# Patient Record
Sex: Female | Born: 1950 | Race: White | Hispanic: No | Smoking: Never smoker
Health system: Southern US, Community
[De-identification: ages and names within clinical notes are randomized; demographics above are authoritative.]

## PROBLEM LIST (undated history)

## (undated) DIAGNOSIS — D509 Iron deficiency anemia, unspecified: Secondary | ICD-10-CM

## (undated) DIAGNOSIS — J189 Pneumonia, unspecified organism: Secondary | ICD-10-CM

## (undated) DIAGNOSIS — H905 Unspecified sensorineural hearing loss: Secondary | ICD-10-CM

## (undated) DIAGNOSIS — T7840XA Allergy, unspecified, initial encounter: Secondary | ICD-10-CM

## (undated) DIAGNOSIS — E669 Obesity, unspecified: Secondary | ICD-10-CM

## (undated) DIAGNOSIS — G43009 Migraine without aura, not intractable, without status migrainosus: Secondary | ICD-10-CM

## (undated) DIAGNOSIS — I10 Essential (primary) hypertension: Secondary | ICD-10-CM

## (undated) DIAGNOSIS — G629 Polyneuropathy, unspecified: Secondary | ICD-10-CM

## (undated) DIAGNOSIS — C8300 Small cell B-cell lymphoma, unspecified site: Secondary | ICD-10-CM

## (undated) DIAGNOSIS — E785 Hyperlipidemia, unspecified: Secondary | ICD-10-CM

## (undated) DIAGNOSIS — J45909 Unspecified asthma, uncomplicated: Secondary | ICD-10-CM

## (undated) DIAGNOSIS — L0390 Cellulitis, unspecified: Secondary | ICD-10-CM

## (undated) DIAGNOSIS — F329 Major depressive disorder, single episode, unspecified: Secondary | ICD-10-CM

## (undated) DIAGNOSIS — E1142 Type 2 diabetes mellitus with diabetic polyneuropathy: Secondary | ICD-10-CM

## (undated) DIAGNOSIS — I2510 Atherosclerotic heart disease of native coronary artery without angina pectoris: Secondary | ICD-10-CM

## (undated) DIAGNOSIS — K219 Gastro-esophageal reflux disease without esophagitis: Secondary | ICD-10-CM

## (undated) DIAGNOSIS — G4733 Obstructive sleep apnea (adult) (pediatric): Secondary | ICD-10-CM

## (undated) DIAGNOSIS — Z9989 Dependence on other enabling machines and devices: Secondary | ICD-10-CM

## (undated) DIAGNOSIS — F32A Depression, unspecified: Secondary | ICD-10-CM

## (undated) DIAGNOSIS — M1712 Unilateral primary osteoarthritis, left knee: Secondary | ICD-10-CM

## (undated) DIAGNOSIS — K5792 Diverticulitis of intestine, part unspecified, without perforation or abscess without bleeding: Secondary | ICD-10-CM

## (undated) DIAGNOSIS — I8500 Esophageal varices without bleeding: Secondary | ICD-10-CM

## (undated) DIAGNOSIS — K7460 Unspecified cirrhosis of liver: Secondary | ICD-10-CM

## (undated) DIAGNOSIS — Z8719 Personal history of other diseases of the digestive system: Secondary | ICD-10-CM

## (undated) DIAGNOSIS — T8859XA Other complications of anesthesia, initial encounter: Secondary | ICD-10-CM

## (undated) DIAGNOSIS — T4145XA Adverse effect of unspecified anesthetic, initial encounter: Secondary | ICD-10-CM

## (undated) DIAGNOSIS — N189 Chronic kidney disease, unspecified: Secondary | ICD-10-CM

## (undated) DIAGNOSIS — K5790 Diverticulosis of intestine, part unspecified, without perforation or abscess without bleeding: Secondary | ICD-10-CM

## (undated) DIAGNOSIS — Z860100 Personal history of colon polyps, unspecified: Secondary | ICD-10-CM

## (undated) DIAGNOSIS — M1990 Unspecified osteoarthritis, unspecified site: Secondary | ICD-10-CM

## (undated) DIAGNOSIS — IMO0002 Reserved for concepts with insufficient information to code with codable children: Secondary | ICD-10-CM

## (undated) HISTORY — DX: Essential (primary) hypertension: I10

## (undated) HISTORY — DX: Gastro-esophageal reflux disease without esophagitis: K21.9

## (undated) HISTORY — DX: Depression, unspecified: F32.A

## (undated) HISTORY — DX: Type 2 diabetes mellitus with diabetic polyneuropathy: E11.42

## (undated) HISTORY — DX: Allergy, unspecified, initial encounter: T78.40XA

## (undated) HISTORY — DX: Personal history of colonic polyps: Z86.010

## (undated) HISTORY — DX: Small cell B-cell lymphoma, unspecified site: C83.00

## (undated) HISTORY — DX: Reserved for concepts with insufficient information to code with codable children: IMO0002

## (undated) HISTORY — DX: Unspecified asthma, uncomplicated: J45.909

## (undated) HISTORY — DX: Polyneuropathy, unspecified: G62.9

## (undated) HISTORY — PX: TONSILLECTOMY: SUR1361

## (undated) HISTORY — DX: Major depressive disorder, single episode, unspecified: F32.9

## (undated) HISTORY — DX: Unspecified osteoarthritis, unspecified site: M19.90

## (undated) HISTORY — DX: Unspecified cirrhosis of liver: K74.60

## (undated) HISTORY — DX: Diverticulosis of intestine, part unspecified, without perforation or abscess without bleeding: K57.90

## (undated) HISTORY — DX: Esophageal varices without bleeding: I85.00

## (undated) HISTORY — PX: ANKLE SURGERY: SHX546

## (undated) HISTORY — DX: Adverse effect of unspecified anesthetic, initial encounter: T41.45XA

## (undated) HISTORY — DX: Diverticulitis of intestine, part unspecified, without perforation or abscess without bleeding: K57.92

## (undated) HISTORY — DX: Personal history of colon polyps, unspecified: Z86.0100

## (undated) HISTORY — DX: Obesity, unspecified: E66.9

## (undated) HISTORY — DX: Migraine without aura, not intractable, without status migrainosus: G43.009

## (undated) HISTORY — DX: Hyperlipidemia, unspecified: E78.5

## (undated) HISTORY — DX: Other complications of anesthesia, initial encounter: T88.59XA

## (undated) HISTORY — PX: ROTATOR CUFF REPAIR: SHX139

---
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
## 1997-10-21 ENCOUNTER — Ambulatory Visit (HOSPITAL_COMMUNITY): Admission: RE | Admit: 1997-10-21 | Discharge: 1997-10-21 | Payer: Self-pay | Admitting: Obstetrics and Gynecology

## 1998-11-24 ENCOUNTER — Encounter: Payer: Self-pay | Admitting: Obstetrics and Gynecology

## 1998-11-24 ENCOUNTER — Ambulatory Visit (HOSPITAL_COMMUNITY): Admission: RE | Admit: 1998-11-24 | Discharge: 1998-11-24 | Payer: Self-pay | Admitting: Obstetrics and Gynecology

## 1998-12-03 ENCOUNTER — Other Ambulatory Visit: Admission: RE | Admit: 1998-12-03 | Discharge: 1998-12-03 | Payer: Self-pay | Admitting: Obstetrics and Gynecology

## 2000-01-11 ENCOUNTER — Ambulatory Visit (HOSPITAL_COMMUNITY): Admission: RE | Admit: 2000-01-11 | Discharge: 2000-01-11 | Payer: Self-pay | Admitting: Obstetrics and Gynecology

## 2000-01-11 ENCOUNTER — Encounter: Payer: Self-pay | Admitting: Obstetrics and Gynecology

## 2000-03-27 ENCOUNTER — Ambulatory Visit (HOSPITAL_COMMUNITY): Admission: RE | Admit: 2000-03-27 | Discharge: 2000-03-27 | Payer: Self-pay | Admitting: Gastroenterology

## 2000-06-30 ENCOUNTER — Encounter: Admission: RE | Admit: 2000-06-30 | Discharge: 2000-09-28 | Payer: Self-pay | Admitting: Internal Medicine

## 2001-04-02 ENCOUNTER — Ambulatory Visit (HOSPITAL_COMMUNITY): Admission: RE | Admit: 2001-04-02 | Discharge: 2001-04-02 | Payer: Self-pay | Admitting: Internal Medicine

## 2001-05-10 ENCOUNTER — Other Ambulatory Visit: Admission: RE | Admit: 2001-05-10 | Discharge: 2001-05-10 | Payer: Self-pay | Admitting: Obstetrics and Gynecology

## 2002-01-17 ENCOUNTER — Ambulatory Visit (HOSPITAL_COMMUNITY): Admission: RE | Admit: 2002-01-17 | Discharge: 2002-01-17 | Payer: Self-pay | Admitting: Obstetrics and Gynecology

## 2002-01-17 ENCOUNTER — Encounter: Payer: Self-pay | Admitting: Obstetrics and Gynecology

## 2003-01-16 ENCOUNTER — Other Ambulatory Visit: Admission: RE | Admit: 2003-01-16 | Discharge: 2003-01-16 | Payer: Self-pay | Admitting: Obstetrics and Gynecology

## 2003-01-28 ENCOUNTER — Encounter: Payer: Self-pay | Admitting: Obstetrics and Gynecology

## 2003-01-28 ENCOUNTER — Ambulatory Visit (HOSPITAL_COMMUNITY): Admission: RE | Admit: 2003-01-28 | Discharge: 2003-01-28 | Payer: Self-pay | Admitting: Obstetrics and Gynecology

## 2004-02-19 ENCOUNTER — Other Ambulatory Visit: Admission: RE | Admit: 2004-02-19 | Discharge: 2004-02-19 | Payer: Self-pay | Admitting: Family Medicine

## 2004-02-19 ENCOUNTER — Encounter: Admission: RE | Admit: 2004-02-19 | Discharge: 2004-02-19 | Payer: Self-pay | Admitting: Obstetrics and Gynecology

## 2005-10-19 ENCOUNTER — Emergency Department (HOSPITAL_COMMUNITY): Admission: EM | Admit: 2005-10-19 | Discharge: 2005-10-19 | Payer: Self-pay | Admitting: Emergency Medicine

## 2005-12-13 ENCOUNTER — Ambulatory Visit: Payer: Self-pay | Admitting: Internal Medicine

## 2006-01-12 ENCOUNTER — Ambulatory Visit: Payer: Self-pay | Admitting: Internal Medicine

## 2010-12-27 ENCOUNTER — Telehealth: Payer: Self-pay | Admitting: Internal Medicine

## 2010-12-28 NOTE — Telephone Encounter (Signed)
I recommended office visit with me. Also, see if she has had a recent CBC.

## 2010-12-28 NOTE — Telephone Encounter (Signed)
Spoke with pt and made her an appt to see Dr. Marina Goodell 12/30/10@4pm  (per Dr. Marina Goodell). Pt states she did have labs done in March, I have called Dr. Vicente Males office to have records sent.

## 2010-12-28 NOTE — Telephone Encounter (Signed)
Spoke with pt and she states that she just had her yearly physical with Dr. Felipa Eth and her stool tested positive for blood. Dr. Felipa Eth instructed the pt to call Dr. Marina Goodell. Pt had a colon with Dr. Marina Goodell in May of 2007.Recall was for 5 years. Dr. Marina Goodell please advise.

## 2010-12-30 ENCOUNTER — Encounter: Payer: Self-pay | Admitting: Internal Medicine

## 2010-12-30 ENCOUNTER — Ambulatory Visit (INDEPENDENT_AMBULATORY_CARE_PROVIDER_SITE_OTHER): Payer: PRIVATE HEALTH INSURANCE | Admitting: Internal Medicine

## 2010-12-30 DIAGNOSIS — Z86010 Personal history of colonic polyps: Secondary | ICD-10-CM

## 2010-12-30 DIAGNOSIS — K5732 Diverticulitis of large intestine without perforation or abscess without bleeding: Secondary | ICD-10-CM

## 2010-12-30 DIAGNOSIS — K219 Gastro-esophageal reflux disease without esophagitis: Secondary | ICD-10-CM

## 2010-12-30 DIAGNOSIS — Z1211 Encounter for screening for malignant neoplasm of colon: Secondary | ICD-10-CM

## 2010-12-30 DIAGNOSIS — E119 Type 2 diabetes mellitus without complications: Secondary | ICD-10-CM

## 2010-12-30 DIAGNOSIS — R195 Other fecal abnormalities: Secondary | ICD-10-CM

## 2010-12-30 MED ORDER — CIPROFLOXACIN HCL 500 MG PO TABS: 500.0000 mg | ORAL_TABLET | Freq: Two times a day (BID) | ORAL | Status: AC

## 2010-12-30 MED ORDER — PEG-KCL-NACL-NASULF-NA ASC-C 100 G PO SOLR: 1.0000 | Freq: Once | ORAL | Status: AC

## 2010-12-30 MED ORDER — METRONIDAZOLE 500 MG PO TABS: 500.0000 mg | ORAL_TABLET | Freq: Two times a day (BID) | ORAL | Status: AC

## 2010-12-30 NOTE — Progress Notes (Signed)
HISTORY OF PRESENT ILLNESS:  Janice [name] is a 60 y.o. female with the below list of medical history who presents today regarding a number of GI issues including recently identified Hemoccult-positive stool, recent bout of diverticulitis, chronic GERD, and followup colonoscopy. The patient was last evaluated in 2007 to establish as a new patient regarding abdominal pain and surveillance colonoscopy. See that dictation. Complete colonoscopy performed 01/12/2006 revealed marked left-sided diverticulosis and diminutive colon polyps which were removed. One hyperplastic and the other without tissue. Follow up in 5 years recommended. Hemoccult testing 12/02/2010 returned positive. Laboratories at that time revealed a normal hemoglobin of 14.0. Mild elevated hepatic transaminases felt secondary to fatty liver. Problems in February or March with abdominal pain felt to be diverticulitis for which she was treated with Cipro and Flagyl. Some residual discomfort at this time. Chronic GERD greater than 5 years. No prior EGD. Concerns over finance.  REVIEW OF SYSTEMS:  All non-GI ROS negative except for arthritis, fatigue, fever, insomnia due to joint aches.  Past Medical History  Diagnosis Date  . Depression   . History of colon polyps     hyperplastic  . DDD (degenerative disc disease)   . Hypertension   . Sleep apnea, obstructive   . Diabetic peripheral neuropathy   . Common migraine   . Diabetes mellitus   . GERD (gastroesophageal reflux disease)   . DJD (degenerative joint disease)   . Obesity   . Diverticulosis     Past Surgical History  Procedure Date  . Ankle surgery     Left     Social History Janice [name]  reports that she has never smoked. She has never used smokeless tobacco. She reports that she does not drink alcohol or use illicit drugs.  family history includes Bone cancer in her mother; Breast cancer in her mother; and Heart failure in her father.  There is no history of  Colon cancer.  No Known Allergies     PHYSICAL EXAMINATION: Vital signs: BP 128/74  Pulse 88  Ht 5\' 4"  (1.626 m)  Wt 210 lb (95.255 kg)  BMI 36.05 kg/m2  Constitutional: generally well-appearing, no acute distress Psychiatric: alert and oriented x3, cooperative Eyes: extraocular movements intact, anicteric, conjunctiva pink Mouth: oral pharynx moist, no lesions Neck: supple no lymphadenopathy Cardiovascular: heart regular rate and rhythm, no murmur Lungs: clear to auscultation bilaterally Abdomen: soft, nondistended, no obvious ascites, no peritoneal signs, normal bowel sounds, no organomegaly. There is focal tenderness in left lower quadrant without rebound. Rectal: Deferred until colonoscopy Extremities: no lower extremity edema bilaterally Skin: no lesions on visible extremities Neuro: No focal deficits.   ASSESSMENT:  #1. Hemoccult-positive stool. Rule out upper or lower GI source #2. History of colon polyps. Due for followup #3. History of diverticulitis. Now with persistent low-grade diverticulitis #4. Chronic GERD. Rule out erosive disease or Barrett's. #5. Fatty liver #6. Multiple medical problems including diabetes   PLAN:  #1. Additional course of ciprofloxacin 500 mg by mouth twice a day x10 days and metronidazole 500 mg by mouth twice a day x10 days prescribed #2. Schedule colonoscopy in 4 weeks. Movi prep prescribed. Diabetic medications adjusted for the exam.The nature of the procedure, as well as the risks, benefits, and alternatives were carefully and thoroughly reviewed with the patient. Ample time for discussion and questions allowed. The patient understood, was satisfied, and agreed to proceed.  #3. Recommended endoscopy. Patient declined due to cost #4. In lieu of #3 initiate PPI therapy.  Multiple samples of Nexium given. Thereafter, omeprazole 20 mg daily #5. Hold a.m. insulin for procedures as well as metformin

## 2010-12-30 NOTE — Patient Instructions (Signed)
Colon [facility] 01/28/11 8:30 am arrive at 7:30 am Movi prep prescriptions sent to pharmacy for you to pick along with Metronidazole and Cipro. Colonoscopy brochure given for you to read Nexium samples given.

## 2010-12-31 ENCOUNTER — Encounter: Payer: Self-pay | Admitting: Internal Medicine

## 2010-12-31 ENCOUNTER — Telehealth: Payer: Self-pay | Admitting: Internal Medicine

## 2010-12-31 NOTE — Telephone Encounter (Signed)
Attempted to call pt back at work number and home number but were unable to reach pt at either number.

## 2011-01-03 NOTE — Telephone Encounter (Signed)
2 years or 2 months? Okay to wait 2 months, but not 2 years.

## 2011-01-03 NOTE — Telephone Encounter (Signed)
Attempted to call pt and got recording stating that the phone is not able to take messages. Called the work number and they stated that no one by that name works there.

## 2011-01-03 NOTE — Telephone Encounter (Signed)
Called pt back at [phone], left message for her to call back.

## 2011-01-03 NOTE — Telephone Encounter (Signed)
Pt wants to know if it would be ok for her to wait 2 years to have her colon, she would like Dr. Lamar Sprinkles opinion. Dr. Marina Goodell please advise.

## 2011-01-04 NOTE — Telephone Encounter (Signed)
Pt aware of Dr. Marina Goodell stating it is ok for her to wait 2 months but not 2 years. Pt states she will keep the appt she has scheduled.

## 2011-01-17 ENCOUNTER — Telehealth: Payer: Self-pay | Admitting: Internal Medicine

## 2011-01-17 NOTE — Telephone Encounter (Signed)
Ok to move the appt if she is feeling ok

## 2011-01-17 NOTE — Telephone Encounter (Signed)
Pt wants to move her colon from 5/18 to 5/11. There was an opening for the same appt time. Pt wants to make sure this is ok with Dr. Marina Goodell since he placed her on antibiotics at her last visit and had mentioned scheduling the colon out 30days from that date. Informed pt that Dr. Marina Goodell was out of the office today but that I would check with him tomorrow and let her know. Pt verbalized understanding. Dr. Marina Goodell please advise if the change in appt date is ok.

## 2011-01-17 NOTE — Telephone Encounter (Signed)
Pt aware and states she feels fine. Appt moved to 01/21/11.

## 2011-01-20 ENCOUNTER — Other Ambulatory Visit: Payer: Self-pay | Admitting: Internal Medicine

## 2011-01-20 MED ORDER — ESOMEPRAZOLE MAGNESIUM 40 MG PO CPDR: 40.0000 mg | DELAYED_RELEASE_CAPSULE | Freq: Every day | ORAL | Status: DC

## 2011-01-20 NOTE — Telephone Encounter (Signed)
Rx. For Nexium sent to pharmacy.

## 2011-01-21 ENCOUNTER — Encounter: Payer: Self-pay | Admitting: Internal Medicine

## 2011-01-21 ENCOUNTER — Ambulatory Visit (AMBULATORY_SURGERY_CENTER): Payer: PRIVATE HEALTH INSURANCE | Admitting: Internal Medicine

## 2011-01-21 VITALS — BP 139/80 | HR 68 | Temp 98.0°F | Resp 18 | Ht 64.0 in | Wt 217.0 lb

## 2011-01-21 DIAGNOSIS — D126 Benign neoplasm of colon, unspecified: Secondary | ICD-10-CM

## 2011-01-21 DIAGNOSIS — K5730 Diverticulosis of large intestine without perforation or abscess without bleeding: Secondary | ICD-10-CM

## 2011-01-21 DIAGNOSIS — Z86010 Personal history of colonic polyps: Secondary | ICD-10-CM

## 2011-01-21 DIAGNOSIS — Z1211 Encounter for screening for malignant neoplasm of colon: Secondary | ICD-10-CM

## 2011-01-21 LAB — GLUCOSE, CAPILLARY
Glucose-Capillary: 159 mg/dL — ABNORMAL HIGH (ref 70–99)
Glucose-Capillary: 140 mg/dL — ABNORMAL HIGH (ref 70–99)

## 2011-01-21 MED ORDER — SODIUM CHLORIDE 0.9 % IV SOLN: 500.0000 mL | INTRAVENOUS | Status: DC

## 2011-01-21 NOTE — Progress Notes (Signed)
Diverticulosis present per MD, polyp

## 2011-01-21 NOTE — Patient Instructions (Signed)
Please read over discharge instructions given today. Also read over education sheets on Colon polyps,diverticulosis,and High fiber diet. Please repeat colonoscopy in 3 years. You will receive a result letter in your mail in about 1-2 weeks. Resume your current medications. Blood sugar was 140. Call us with any problems or questions.

## 2011-01-24 ENCOUNTER — Telehealth: Payer: Self-pay | Admitting: Internal Medicine

## 2011-01-24 ENCOUNTER — Telehealth: Payer: Self-pay | Admitting: *Deleted

## 2011-01-24 MED ORDER — OMEPRAZOLE 40 MG PO CPDR: 40.0000 mg | DELAYED_RELEASE_CAPSULE | Freq: Every day | ORAL | Status: DC

## 2011-01-24 NOTE — Telephone Encounter (Signed)

## 2011-01-24 NOTE — Telephone Encounter (Signed)
Rx. Sent for Omeprazole

## 2011-01-28 ENCOUNTER — Other Ambulatory Visit: Payer: PRIVATE HEALTH INSURANCE | Admitting: Internal Medicine

## 2011-01-29 ENCOUNTER — Inpatient Hospital Stay (HOSPITAL_COMMUNITY)
Admission: EM | Admit: 2011-01-29 | Discharge: 2011-01-31 | DRG: 921 | Disposition: A | Discharge status: Home / Self Care | Payer: PRIVATE HEALTH INSURANCE | Attending: Gastroenterology | Admitting: Gastroenterology

## 2011-01-29 ENCOUNTER — Telehealth: Payer: Self-pay | Admitting: Gastroenterology

## 2011-01-29 DIAGNOSIS — I10 Essential (primary) hypertension: Secondary | ICD-10-CM | POA: Diagnosis present

## 2011-01-29 DIAGNOSIS — IMO0002 Reserved for concepts with insufficient information to code with codable children: Principal | ICD-10-CM | POA: Diagnosis present

## 2011-01-29 DIAGNOSIS — Z860100 Personal history of colon polyps, unspecified: Secondary | ICD-10-CM

## 2011-01-29 DIAGNOSIS — Z79899 Other long term (current) drug therapy: Secondary | ICD-10-CM

## 2011-01-29 DIAGNOSIS — K5730 Diverticulosis of large intestine without perforation or abscess without bleeding: Secondary | ICD-10-CM | POA: Diagnosis present

## 2011-01-29 DIAGNOSIS — Y838 Other surgical procedures as the cause of abnormal reaction of the patient, or of later complication, without mention of misadventure at the time of the procedure: Secondary | ICD-10-CM | POA: Diagnosis present

## 2011-01-29 DIAGNOSIS — K625 Hemorrhage of anus and rectum: Secondary | ICD-10-CM

## 2011-01-29 DIAGNOSIS — E119 Type 2 diabetes mellitus without complications: Secondary | ICD-10-CM | POA: Diagnosis present

## 2011-01-29 DIAGNOSIS — Z7982 Long term (current) use of aspirin: Secondary | ICD-10-CM

## 2011-01-29 LAB — GLUCOSE, CAPILLARY: Glucose-Capillary: 117 mg/dL — ABNORMAL HIGH (ref 70–99)

## 2011-01-29 LAB — COMPREHENSIVE METABOLIC PANEL WITH GFR
ALT: 56 U/L — ABNORMAL HIGH (ref 0–35)
AST: 36 U/L (ref 0–37)
Albumin: 4.3 g/dL (ref 3.5–5.2)
Alkaline Phosphatase: 74 U/L (ref 39–117)
BUN: 13 mg/dL (ref 6–23)
CO2: 27 meq/L (ref 19–32)
Calcium: 9.5 mg/dL (ref 8.4–10.5)
Chloride: 104 meq/L (ref 96–112)
Creatinine, Ser: 0.55 mg/dL (ref 0.4–1.2)
GFR calc non Af Amer: >60 mL/min
Glucose, Bld: 141 mg/dL — ABNORMAL HIGH (ref 70–99)
Potassium: 3.9 meq/L (ref 3.5–5.1)
Sodium: 140 meq/L (ref 135–145)
Total Bilirubin: 0.3 mg/dL (ref 0.3–1.2)
Total Protein: 6.5 g/dL (ref 6.0–8.3)

## 2011-01-29 LAB — DIFFERENTIAL
Basophils Absolute: 0.1 K/uL (ref 0.0–0.1)
Basophils Relative: 1 % (ref 0–1)
Eosinophils Absolute: 0.3 K/uL (ref 0.0–0.7)
Eosinophils Relative: 5 % (ref 0–5)
Lymphocytes Relative: 37 % (ref 12–46)
Lymphs Abs: 2.6 K/uL (ref 0.7–4.0)
Monocytes Absolute: 0.5 K/uL (ref 0.1–1.0)
Monocytes Relative: 8 % (ref 3–12)
Neutro Abs: 3.4 K/uL (ref 1.7–7.7)
Neutrophils Relative %: 50 % (ref 43–77)

## 2011-01-29 LAB — CBC
HCT: 38.8 % (ref 36.0–46.0)
HCT: 36.9 % (ref 36.0–46.0)
Hemoglobin: 12.9 g/dL (ref 12.0–15.0)
Hemoglobin: 11.9 g/dL — ABNORMAL LOW (ref 12.0–15.0)
MCH: 29.4 pg (ref 26.0–34.0)
MCH: 28.7 pg (ref 26.0–34.0)
MCHC: 33.2 g/dL (ref 30.0–36.0)
MCHC: 32.2 g/dL (ref 30.0–36.0)
MCV: 88.4 fL (ref 78.0–100.0)
MCV: 88.9 fL (ref 78.0–100.0)
Platelets: 162 10*3/uL (ref 150–400)
Platelets: 168 10*3/uL (ref 150–400)
RBC: 4.39 MIL/uL (ref 3.87–5.11)
RBC: 4.15 MIL/uL (ref 3.87–5.11)
RDW: 13.2 % (ref 11.5–15.5)
RDW: 13.1 % (ref 11.5–15.5)
WBC: 6.9 10*3/uL (ref 4.0–10.5)
WBC: 7.6 10*3/uL (ref 4.0–10.5)

## 2011-01-29 LAB — URINALYSIS, ROUTINE W REFLEX MICROSCOPIC
Bilirubin Urine: NEGATIVE
Glucose, UA: NEGATIVE mg/dL
Hgb urine dipstick: NEGATIVE
Ketones, ur: NEGATIVE mg/dL
Nitrite: NEGATIVE
Protein, ur: NEGATIVE mg/dL
Specific Gravity, Urine: 1.016 (ref 1.005–1.030)
Urobilinogen, UA: 0.2 mg/dL (ref 0.0–1.0)
pH: 5.5 (ref 5.0–8.0)

## 2011-01-29 LAB — URINE MICROSCOPIC-ADD ON

## 2011-01-29 LAB — TYPE AND SCREEN
ABO/RH(D): A POS
Antibody Screen: NEGATIVE

## 2011-01-29 LAB — OCCULT BLOOD, POC DEVICE: Fecal Occult Bld: POSITIVE

## 2011-01-29 LAB — ABO/RH: ABO/RH(D): A POS

## 2011-01-29 NOTE — Telephone Encounter (Signed)
Colonoscopy 1 week ago with Dr. Marina Goodell. 2cm pedunculated polyp remove with snare/cautery.  Fine until today, cramping and single episode of red rectal bleeding.  She otherwise feels fine, will call or go to ER if bleeding continues.  Post polypectomy bleeding?

## 2011-01-30 DIAGNOSIS — K5730 Diverticulosis of large intestine without perforation or abscess without bleeding: Secondary | ICD-10-CM

## 2011-01-30 DIAGNOSIS — K922 Gastrointestinal hemorrhage, unspecified: Secondary | ICD-10-CM

## 2011-01-30 DIAGNOSIS — K633 Ulcer of intestine: Secondary | ICD-10-CM

## 2011-01-30 LAB — CBC
HCT: 34.2 % — ABNORMAL LOW (ref 36.0–46.0)
HCT: 36.5 % (ref 36.0–46.0)
Hemoglobin: 11.1 g/dL — ABNORMAL LOW (ref 12.0–15.0)
Hemoglobin: 11.9 g/dL — ABNORMAL LOW (ref 12.0–15.0)
MCH: 28.9 pg (ref 26.0–34.0)
MCH: 28.9 pg (ref 26.0–34.0)
MCHC: 32.5 g/dL (ref 30.0–36.0)
MCHC: 32.6 g/dL (ref 30.0–36.0)
MCV: 89.1 fL (ref 78.0–100.0)
MCV: 88.6 fL (ref 78.0–100.0)
Platelets: 163 10*3/uL (ref 150–400)
Platelets: 165 10*3/uL (ref 150–400)
RBC: 3.84 MIL/uL — ABNORMAL LOW (ref 3.87–5.11)
RBC: 4.12 MIL/uL (ref 3.87–5.11)
RDW: 13.3 % (ref 11.5–15.5)
RDW: 13.4 % (ref 11.5–15.5)
WBC: 6.5 10*3/uL (ref 4.0–10.5)
WBC: 6.1 10*3/uL (ref 4.0–10.5)

## 2011-01-30 LAB — BASIC METABOLIC PANEL
BUN: 11 mg/dL (ref 6–23)
CO2: 24 mEq/L (ref 19–32)
Calcium: 9.2 mg/dL (ref 8.4–10.5)
Chloride: 108 mEq/L (ref 96–112)
Creatinine, Ser: 0.5 mg/dL (ref 0.4–1.2)
GFR calc Af Amer: >60 mL/min (ref >60)
GFR calc non Af Amer: >60 mL/min (ref >60)
Glucose, Bld: 114 mg/dL — ABNORMAL HIGH (ref 70–99)
Potassium: 4.1 mEq/L (ref 3.5–5.1)
Sodium: 141 mEq/L (ref 135–145)

## 2011-01-30 LAB — GLUCOSE, CAPILLARY
Glucose-Capillary: 251 mg/dL — ABNORMAL HIGH (ref 70–99)
Glucose-Capillary: 112 mg/dL — ABNORMAL HIGH (ref 70–99)

## 2011-01-30 LAB — HEMOGLOBIN AND HEMATOCRIT, BLOOD
HCT: 33 % — ABNORMAL LOW (ref 36.0–46.0)
Hemoglobin: 11 g/dL — ABNORMAL LOW (ref 12.0–15.0)

## 2011-01-31 DIAGNOSIS — K633 Ulcer of intestine: Secondary | ICD-10-CM

## 2011-01-31 DIAGNOSIS — K922 Gastrointestinal hemorrhage, unspecified: Secondary | ICD-10-CM

## 2011-01-31 LAB — CBC
HCT: 32.8 % — ABNORMAL LOW (ref 36.0–46.0)
Hemoglobin: 10.8 g/dL — ABNORMAL LOW (ref 12.0–15.0)
MCH: 29 pg (ref 26.0–34.0)
MCHC: 32.9 g/dL (ref 30.0–36.0)
MCV: 88.2 fL (ref 78.0–100.0)
Platelets: 134 10*3/uL — ABNORMAL LOW (ref 150–400)
RBC: 3.72 MIL/uL — ABNORMAL LOW (ref 3.87–5.11)
RDW: 13.2 % (ref 11.5–15.5)
WBC: 6.1 10*3/uL (ref 4.0–10.5)

## 2011-01-31 LAB — GLUCOSE, CAPILLARY: Glucose-Capillary: 179 mg/dL — ABNORMAL HIGH (ref 70–99)

## 2011-02-01 NOTE — Telephone Encounter (Signed)
She was admitted, underwent colonoscopy on Sunday.  Will need cqi charting

## 2011-02-21 ENCOUNTER — Telehealth: Payer: Self-pay | Admitting: Internal Medicine

## 2011-02-21 NOTE — Telephone Encounter (Signed)
Pt had a FLEX SIG on 01/30/11 and bleed and had to be seen again. Pt stated electrodes were placed in the same areas again on her upper chest above each breast. The area is red and irritated and has "whelts" in the locations now. Pt is a diabetic.  Pt has tried Neosporin w/o much change. She has started using a steroid cream of her husband's that is helping some. Pt instructed to use the steroid cream, triamcinolone, and I will call her ; pt stated understanding.

## 2011-02-23 NOTE — Telephone Encounter (Signed)
lmom for pt to call back and give [place] an update on her skin problem.

## 2011-02-23 NOTE — Telephone Encounter (Signed)
Pt stated the itching has improved greatly, but the bumps and whelts remain- she thinks everything will be ok. Informed pt to call us back and we will see her if problem doesn't go away; pt stated understanding.

## 2011-03-08 NOTE — Discharge Summary (Addendum)
[name], [name]                 ACCOUNT NO.:  [id number]  MEDICAL RECORD NO.:  [id number]  LOCATION:  1529                         FACILITY:  [facility]  PHYSICIAN:  Iva Boop, MD,FACGDATE OF BIRTH:  [birth year]  DATE OF ADMISSION:  01/29/2011 DATE OF DISCHARGE:  01/31/2011                              DISCHARGE SUMMARY   ADMITTING DIAGNOSES: 49. A 60 year old female with acute lower gastrointestinal bleed status     post colonoscopy and polypectomy 8 days previous, course most     consistent with post polypectomy hemorrhage, rule out diverticular. 2. Adult-onset diabetes mellitus. 3. Osteoarthritis. 4. Hypertension 5. Obesity. 6. Obstructive sleep apnea with CPAP use.  DISCHARGE DIAGNOSES: 1. Resolved acute postpolypectomy bleed. 2. Anemia, normocytic, secondary to above. 3. Tubovillous adenomatous polyp removed at time of colonoscopy.  CONSULTATIONS:  None.  PROCEDURES:  Colonoscopy with Endo clipping on Jan 30, 2011, Dr. Christella Hartigan.  BRIEF HISTORY:  Janice [name] is a pleasant 60 year old female who had undergone colonoscopy per Dr. Marina Goodell at the [facility] 8 days prior to this admission.  Procedure was done for polyps surveillance.  She was found to have a 2-cm polyp which was removed with snare and cautery. She did well for 6 to 7 days and then on the day of admission had 4 episodes of bright red rectal bleeding and minor abdominal cramping. She had been taking aspirin daily.  No NSAIDs.  She had called in, was advised to come to the emergency room, was seen and evaluated and admitted, hemodynamically stable, with hemoglobin of 12.9 on admission with probable acute post polypectomy bleed.  LABORATORY STUDIES:  On Jan 29, 2011, WBC 6.9, hemoglobin 12.9, hematocrit of 38.8, MCV of 88.4, platelets 162.  Followup on the 20th, WBC 6.1, hemoglobin 11.9, hematocrit of 36.5 and on the 21st of WBC of 6.1, hemoglobin 10.8, hematocrit of 32.8.  Electrolytes within  normal limits.  Glucose was 114, BUN 11, creatinine 0.50.  Urinalysis, 0 to 2 WBCs.  X-RAY STUDIES:  None.  HOSPITAL COURSE:  The patient was admitted to the service of Dr. Rob Bunting who was covering on-call.  She was initially kept on clear liquids, placed on IV fluids, serial H and H and underwent bowel prep on the evening of admission with plans for possible colonoscopy the following morning, depending on her course.  The patient tolerated the prep without difficulty.  The following morning was still having some red rectal bleeding and colonoscopy was pursued.  This showed diverticulosis in the left colon.  No blood in the colon.  The site of her recent polypectomy was found and there was an ulcer at the tip of a broad-based stalk.  There was no active bleeding or visible vessel.  The ulcer was Endoclipped.  The patient tolerated the procedure without difficulty, was observed another 24 hours, had no further evidence of bleeding, hemoglobin 10.8.  Pathology from her original polypectomy came back showing a tubovillous adenoma with no evidence of dysplasia.  She was allowed discharge to home on Jan 31, 2011, with instructions to follow up with Dr. Marina Goodell.  She was to avoid all aspirin and NSAIDs over the next 14 days, to  limit vigorous activity over the next 48 hours and advance her diet as tolerated.  MEDICATIONS ON DISCHARGE:  Same as on admission which include: 1. NovoLog 70/30 sliding scale at breakfast, 25 at lunch and 80 at     dinner. 2. Metformin 1 g b.i.d. 3. Lisinopril 20 mg daily. 4. Fluticasone 2 sprays at bedtime. 5. Loratadine 10 mg at bedtime. 6. Sonata 10 mg q.p.m. 7. Reglan as needed. 8. She had been taking Aleve and this was discontinued. 9. Postop eye drops as needed. 10.Aspirin 81 mg daily, on hold for the next 2 weeks. 11.Paxil 10 mg daily.     Amy Esterwood, PA-C   ______________________________ Iva Boop, MD,FACG    AE/MEDQ  D:   02/17/2011  T:  02/17/2011  Job:  [id number]  Electronically Signed by AMY ESTERWOOD PA-C on 03/08/2011 09:18:39 AM Electronically Signed by Stan Head MDFACG on 03/22/2011 03:28:50 PM

## 2011-03-22 ENCOUNTER — Other Ambulatory Visit: Payer: Self-pay | Admitting: Orthopedic Surgery

## 2011-03-22 DIAGNOSIS — M25511 Pain in right shoulder: Secondary | ICD-10-CM

## 2011-03-24 ENCOUNTER — Ambulatory Visit
Admission: RE | Admit: 2011-03-24 | Discharge: 2011-03-24 | Disposition: A | Discharge status: Home / Self Care | Payer: PRIVATE HEALTH INSURANCE | Source: Ambulatory Visit | Attending: Orthopedic Surgery | Admitting: Orthopedic Surgery

## 2011-03-24 DIAGNOSIS — M25511 Pain in right shoulder: Secondary | ICD-10-CM

## 2011-09-11 ENCOUNTER — Ambulatory Visit (INDEPENDENT_AMBULATORY_CARE_PROVIDER_SITE_OTHER): Payer: PRIVATE HEALTH INSURANCE

## 2011-09-11 DIAGNOSIS — J159 Unspecified bacterial pneumonia: Secondary | ICD-10-CM

## 2011-09-13 DIAGNOSIS — L0390 Cellulitis, unspecified: Secondary | ICD-10-CM

## 2011-09-13 HISTORY — DX: Cellulitis, unspecified: L03.90

## 2012-03-06 ENCOUNTER — Telehealth: Payer: Self-pay | Admitting: Internal Medicine

## 2012-03-06 MED ORDER — METRONIDAZOLE 500 MG PO TABS: 500.0000 mg | ORAL_TABLET | Freq: Two times a day (BID) | ORAL | Status: AC

## 2012-03-06 MED ORDER — CIPROFLOXACIN HCL 500 MG PO TABS: 500.0000 mg | ORAL_TABLET | Freq: Two times a day (BID) | ORAL | Status: AC

## 2012-03-06 NOTE — Telephone Encounter (Signed)
Spoke with pt and she is aware. Pt states she is still out of work and cannot afford the copay to come in tomorrow. 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 Instructed pt to call us back if she does not feel better with the antibiotics. Dr. Marina Goodell aware.

## 2012-03-06 NOTE — Telephone Encounter (Signed)
I am sympathetic to her economic issues, but we can not diagnose and treat over the phone without appropriate follow up plans. Agree with your instructions, Bonita Quin

## 2012-03-06 NOTE — Telephone Encounter (Signed)
Call in cipro 500mg  bid and flagyl 500 mg bid  - each for 10 days.  Have her seen in the office tomorrow morning with Amy (as I am supervising)

## 2012-03-06 NOTE — Telephone Encounter (Signed)
Pt last OV with Dr. Marina Goodell 12/30/10. Pt states she is having a flare of her diverticulitis. States she started having pain in her left lower quadrant yesterday. Requesting that we call in some antibiotics. Dr. Marina Goodell please advise.

## 2012-04-14 ENCOUNTER — Other Ambulatory Visit: Payer: Self-pay | Admitting: Internal Medicine

## 2012-04-23 ENCOUNTER — Other Ambulatory Visit (INDEPENDENT_AMBULATORY_CARE_PROVIDER_SITE_OTHER): Payer: PRIVATE HEALTH INSURANCE

## 2012-04-23 ENCOUNTER — Ambulatory Visit (INDEPENDENT_AMBULATORY_CARE_PROVIDER_SITE_OTHER): Payer: PRIVATE HEALTH INSURANCE | Admitting: Physician Assistant

## 2012-04-23 ENCOUNTER — Encounter: Payer: Self-pay | Admitting: Physician Assistant

## 2012-04-23 ENCOUNTER — Telehealth: Payer: Self-pay | Admitting: Internal Medicine

## 2012-04-23 VITALS — BP 134/62 | HR 88 | Temp 98.8°F | Ht 64.0 in | Wt 212.0 lb

## 2012-04-23 DIAGNOSIS — I10 Essential (primary) hypertension: Secondary | ICD-10-CM

## 2012-04-23 DIAGNOSIS — E119 Type 2 diabetes mellitus without complications: Secondary | ICD-10-CM

## 2012-04-23 DIAGNOSIS — R109 Unspecified abdominal pain: Secondary | ICD-10-CM

## 2012-04-23 DIAGNOSIS — K5792 Diverticulitis of intestine, part unspecified, without perforation or abscess without bleeding: Secondary | ICD-10-CM

## 2012-04-23 DIAGNOSIS — K5732 Diverticulitis of large intestine without perforation or abscess without bleeding: Secondary | ICD-10-CM

## 2012-04-23 DIAGNOSIS — K635 Polyp of colon: Secondary | ICD-10-CM

## 2012-04-23 DIAGNOSIS — D126 Benign neoplasm of colon, unspecified: Secondary | ICD-10-CM

## 2012-04-23 LAB — BASIC METABOLIC PANEL
BUN: 11 mg/dL (ref 6–23)
CO2: 29 mEq/L (ref 19–32)
Calcium: 9.4 mg/dL (ref 8.4–10.5)
Chloride: 100 mEq/L (ref 96–112)
Creatinine, Ser: 0.6 mg/dL (ref 0.4–1.2)
GFR: 114.54 mL/min (ref >60.00)
Glucose, Bld: 163 mg/dL — ABNORMAL HIGH (ref 70–99)
Potassium: 4.7 mEq/L (ref 3.5–5.1)
Sodium: 139 mEq/L (ref 135–145)

## 2012-04-23 LAB — CBC WITH DIFFERENTIAL/PLATELET
Basophils Absolute: 0 10*3/uL (ref 0.0–0.1)
Basophils Relative: 0.3 % (ref 0.0–3.0)
Eosinophils Absolute: 0.3 10*3/uL (ref 0.0–0.7)
Eosinophils Relative: 2.5 % (ref 0.0–5.0)
HCT: 42.1 % (ref 36.0–46.0)
Hemoglobin: 14 g/dL (ref 12.0–15.0)
Lymphocytes Relative: 17.7 % (ref 12.0–46.0)
Lymphs Abs: 2 10*3/uL (ref 0.7–4.0)
MCHC: 33.3 g/dL (ref 30.0–36.0)
MCV: 84.5 fl (ref 78.0–100.0)
Monocytes Absolute: 1 10*3/uL (ref 0.1–1.0)
Monocytes Relative: 8.9 % (ref 3.0–12.0)
Neutro Abs: 7.8 10*3/uL — ABNORMAL HIGH (ref 1.4–7.7)
Neutrophils Relative %: 70.6 % (ref 43.0–77.0)
Platelets: 120 10*3/uL — ABNORMAL LOW (ref 150.0–400.0)
RBC: 4.98 Mil/uL (ref 3.87–5.11)
RDW: 15 % — ABNORMAL HIGH (ref 11.5–14.6)
WBC: 11.1 10*3/uL — ABNORMAL HIGH (ref 4.5–10.5)

## 2012-04-23 MED ORDER — CIPROFLOXACIN HCL 500 MG PO TABS: ORAL_TABLET | ORAL | Status: DC

## 2012-04-23 MED ORDER — METRONIDAZOLE 500 MG PO TABS: ORAL_TABLET | ORAL | Status: DC

## 2012-04-23 NOTE — Progress Notes (Signed)
Subjective:    Patient ID: Janice [name], female    DOB: 07-Mar-1951, 61 y.o.   MRN: [id number]  HPI Janice [name] is a pleasant 61 year old female known to Dr. Marina Goodell who has history of diverticular disease and colon polyps. She had a brief hospitalization in May of 2012 with post polypectomy bleed which required a clipping of an ulcerated polyp site. Patient was treated with a course of Cipro and Flagyl in June of 2013 when she called in complaining of an exacerbation of diverticulitis. She says she recovered from that and was doing well until the past week. She says she developed some nausea and then lower abdominal discomfort over the past several days which was not severe. She says she wasn't thinking about diverticulitis and was not sure that was what was going on. Yesterday she developed worse left-sided abdominal discomfort, and this morning says she felt very bad and decided to come on to be evaluated She says she feels full and bloated and her abdomen. She's uncomfortable with walking. She has been having bowel movements has not noted any melena or hematochezia. She says she did have an episode of constipation about 2 weeks ago and wonders if that triggered this. She has not had any documented fever or chills and has been eating light since yesterday . Last colonoscopy was done in May of 2012 with severe left-sided diverticulosis. Endoscopy was also done in May of 2012 and was unremarkable    Review of Systems  Constitutional: Positive for activity change and appetite change.  HENT: Negative.   Eyes: Negative.   Respiratory: Negative.   Cardiovascular: Negative.   Gastrointestinal: Positive for nausea and abdominal pain.  Genitourinary: Negative.   Musculoskeletal: Positive for back pain.  Neurological: Negative.   Hematological: Negative.   Psychiatric/Behavioral: Negative.    Outpatient Encounter Prescriptions as of 04/23/2012  Medication Sig Dispense Refill  . acetaminophen (TYLENOL) 500  MG tablet Take 500 mg by mouth every 6 (six) hours as needed.        . Calcium Carb-Cholecalciferol (CALCIUM 1000 + D PO) Take by mouth.        . ergotamine-caffeine (CAFERGOT) 1-100 MG per tablet Take 1 tablet by mouth once. As needed       . estradiol (ESTRACE) 0.1 MG/GM vaginal cream Place 2 g vaginally daily.        . Flaxseed, Linseed, OIL Take by mouth. Twice daily       . fluticasone (VERAMYST) 27.5 MCG/SPRAY nasal spray Place 2 sprays into the nose as needed.       . Insulin Aspart Prot & Aspart (NOVOLOG MIX 70/30 FLEXPEN [place]) Inject into the skin. 65 units in the morning, 35 units at lunch, and 80 units at dinner or as directed      . lisinopril (PRINIVIL,ZESTRIL) 40 MG tablet Take 20 mg by mouth daily.       . metFORMIN (GLUCOPHAGE XR) 500 MG 24 hr tablet Take 500 mg by mouth. 2 pills in the morning and 2 pills at night      . metoCLOPramide (REGLAN) 10 MG tablet Take 10 mg by mouth. As needed       . Multiple Vitamin (MULTIVITAMIN) tablet Take 1 tablet by mouth daily.        . naproxen sodium (ANAPROX) 220 MG tablet Take 220 mg by mouth. Couple of time a week as needed       . omeprazole (PRILOSEC) 40 MG capsule TAKE 1 CAPSULE (40 MG TOTAL)  BY MOUTH DAILY.  30 capsule  6  . PARoxetine (PAXIL) 20 MG tablet Take 10 mg by mouth every morning.       Marland Kitchen VITAMIN A PO Take 1 capsule by mouth 2 (two) times daily.      . vitamin C (ASCORBIC ACID) 500 MG tablet Take 500 mg by mouth 2 (two) times daily.      Marland Kitchen VITAMIN E PO Take 1 capsule by mouth 2 (two) times daily.      . zaleplon (SONATA) 10 MG capsule Take 10 mg by mouth at bedtime as needed.       . ciprofloxacin (CIPRO) 500 MG tablet Take one tablet twice a day for 14 days  30 tablet  0  . metroNIDAZOLE (FLAGYL) 500 MG tablet Take one tablet twice a day for 14 days  30 tablet  0  . DISCONTD: aspirin 81 MG tablet Take 81 mg by mouth daily.        Marland Kitchen DISCONTD: esomeprazole (NEXIUM) 40 MG capsule Take 1 capsule (40 mg total) by mouth daily  before breakfast.  30 capsule  11  . DISCONTD: loratadine (CLARITIN) 10 MG tablet Take 10 mg by mouth daily.         Facility-Administered Encounter Medications as of 04/23/2012  Medication Dose Route Frequency Provider Last Rate Last Dose  . DISCONTD: 0.9 %  sodium chloride infusion  500 mL Intravenous Continuous Hilarie Fredrickson, MD       No Known Allergies     Patient Active Problem List  Diagnosis  . Diverticulitis  . HTN (hypertension)  . Diabetes mellitus  . Colon polyps   History   Social History  . Marital Status: Married    Spouse Name: N/A    Number of Children: 0  . Years of Education: N/A   Occupational History  . Unemployed    Social History Main Topics  . Smoking status: Never Smoker   . Smokeless tobacco: Never Used  . Alcohol Use: No  . Drug Use: No  . Sexually Active: Not on file   Other Topics Concern  . Not on file   Social History Narrative   1 caffeine drink daily     Objective:   Physical Exam well-developed white female in no acute distress, pleasant pressure 134/62 pulse 88 temp 98 8 height 5 foot 4 weight 212. HEENT; nontraumatic normocephalic EOMI PERRLA  an icteric,Neck; Supple no JVD, Cardiovascular; regular rate and rhythm with S1-S2 no murmur or gallop, Pulmonary ;clear bilaterally, Abdomen; large soft bowel sounds are present but hypoactive she is quite tender in the left mid quadrant, left lower quadran,t there is no guarding but she does have some rebound ,no palpable mass or hepatosplenomegaly, Rectal; not done, Extremities ;no clubbing cyanosis or edema skin warm dry, Psych; mood and affect normal and appropriate        Assessment & Plan:  #24 61 year old female with recurrent diverticulitis #2 history of adenomatous colon polyps, last colonoscopy May 2012 #3  adult onset diabetes mellitus #4 hypertension  Plan; full liquid diet over the next 24-48 hours until abdominal pain improved Home to rest, Tylenol as needed for pain Start  Cipro 500 mg by mouth twice daily x14 day Start Flagyl 500 mg by mouth twice daily x14 days CBC with differential, and bmet  Today Advised patient that should she have annual worsening in symptoms including fever or diaphoresis vomiting or increase pain that she will need evaluation in the ER and  possible admission. She will call with progress report in 48 hours, if her symptoms are not improving she will need CT of the abdomen and pelvis.

## 2012-04-23 NOTE — Patient Instructions (Addendum)
We are sending in your prescriptions to your pharmacy today Use Tylenol for Pain as needed Home to rest Janice [name] will contact you next Wednesday to see how your doing, if no better we will arrange for you to have a CT Scan Stay on full liquid diet

## 2012-04-23 NOTE — Telephone Encounter (Signed)
Pt states she is having a diverticulitis flare. C/O nausea, constipation, cramping and bloating. Pt scheduled to see Mike Gip PA today at 2pm. Pt aware of appt date and time.

## 2012-04-24 NOTE — Progress Notes (Signed)
i agree with the plan outlined in this note 

## 2012-04-25 ENCOUNTER — Telehealth: Payer: Self-pay

## 2012-04-25 NOTE — Telephone Encounter (Signed)
Patient reports no fever, " I'm not worse I guess I am some better".  Minimal tenderness on her left side.  She would like a few more days to see if she will improve before CT.  Amy, Please advise

## 2012-06-18 ENCOUNTER — Telehealth: Payer: Self-pay | Admitting: Gastroenterology

## 2012-06-18 NOTE — Telephone Encounter (Signed)
Not needed

## 2012-07-17 ENCOUNTER — Telehealth: Payer: Self-pay | Admitting: Internal Medicine

## 2012-07-17 ENCOUNTER — Encounter: Payer: Self-pay | Admitting: Physician Assistant

## 2012-07-17 ENCOUNTER — Ambulatory Visit (INDEPENDENT_AMBULATORY_CARE_PROVIDER_SITE_OTHER): Payer: PRIVATE HEALTH INSURANCE | Admitting: Physician Assistant

## 2012-07-17 VITALS — BP 130/70 | HR 72 | Ht 64.5 in | Wt 216.0 lb

## 2012-07-17 DIAGNOSIS — K5792 Diverticulitis of intestine, part unspecified, without perforation or abscess without bleeding: Secondary | ICD-10-CM

## 2012-07-17 DIAGNOSIS — K5732 Diverticulitis of large intestine without perforation or abscess without bleeding: Secondary | ICD-10-CM

## 2012-07-17 MED ORDER — CIPROFLOXACIN HCL 500 MG PO TABS: 500.0000 mg | ORAL_TABLET | Freq: Two times a day (BID) | ORAL | Status: AC

## 2012-07-17 MED ORDER — METRONIDAZOLE 500 MG PO TABS: 500.0000 mg | ORAL_TABLET | Freq: Three times a day (TID) | ORAL | Status: AC

## 2012-07-17 NOTE — Telephone Encounter (Signed)
Pt thinks she is having a diverticulitis flare. States she had a temperature of 99.4 yesterday. She reports she is having pain in her left lower quadrant along with some cramping. Pt requesting some medication. Pt scheduled to see Mike Gip PA today at 2pm. Pt aware of appt date and time.

## 2012-07-17 NOTE — Progress Notes (Signed)
Agree 

## 2012-07-17 NOTE — Progress Notes (Signed)
Subjective:    Patient ID: Janice [name], female    DOB: December 07, 1950, 61 y.o.   MRN: [id number]  HPI Janice [name] is a pleasant 61 year old white female known to Dr. Marina Goodell, with history of diverticular disease and colon polyps. She required brief hospitalization in May of 2012 with post polypectomy bleeding which required Endo clipping of an ulcerated polyp site. She was treated for an episode of diverticulitis in June of 2013 and then again in August of 2013. Last colonoscopy was done in May of 2012 with finding of severe left sided diverticulosis, in addition to be adenomatous colon polyps . Patient comes in today stating that she had onset about 2 weeks ago with recurrent left-sided abdominal pain which lasted less than a day and then resolved. She says she was feeling fine in the interim until about 3:30 yesterday morning when she woke up with recurrent left-sided pain that she knew with diverticulitis She says it is not as bad as the episode she had in August but that she's been hurting constantly since. She is back off on her diet and has been primarily on liquids. She's not had any nausea or vomiting. She had been a bit constipated. No melena or hematochezia. She did have a low-grade temperature 90.9 range yesterday, no chills or sweats. She is frustrated with recurrent nature of her diverticulitis, on further questioning she realized that she had been eating peanuts on a daily basis recently    Review of Systems  Constitutional: Negative.   HENT: Negative.   Eyes: Negative.   Respiratory: Negative.   Cardiovascular: Negative.   Gastrointestinal: Positive for abdominal pain.  Genitourinary: Negative.   Musculoskeletal: Negative.   Neurological: Negative.   Hematological: Negative.   Psychiatric/Behavioral: Negative.    Outpatient Prescriptions Prior to Visit  Medication Sig Dispense Refill  . acetaminophen (TYLENOL) 500 MG tablet Take 500 mg by mouth every 6 (six) hours as needed.          . Calcium Carb-Cholecalciferol (CALCIUM 1000 + D PO) Take by mouth.        . ciprofloxacin (CIPRO) 500 MG tablet Take one tablet twice a day for 14 days  30 tablet  0  . ergotamine-caffeine (CAFERGOT) 1-100 MG per tablet Take 1 tablet by mouth once. As needed       . estradiol (ESTRACE) 0.1 MG/GM vaginal cream Place 2 g vaginally daily.        . Flaxseed, Linseed, OIL Take by mouth. Twice daily       . fluticasone (VERAMYST) 27.5 MCG/SPRAY nasal spray Place 2 sprays into the nose as needed.       . Insulin Aspart Prot & Aspart (NOVOLOG MIX 70/30 FLEXPEN [place]) Inject into the skin. 65 units in the morning, 35 units at lunch, and 80 units at dinner or as directed      . lisinopril (PRINIVIL,ZESTRIL) 40 MG tablet Take 20 mg by mouth daily.       . metFORMIN (GLUCOPHAGE XR) 500 MG 24 hr tablet Take 500 mg by mouth. 2 pills in the morning and 2 pills at night      . metoCLOPramide (REGLAN) 10 MG tablet Take 10 mg by mouth. As needed       . metroNIDAZOLE (FLAGYL) 500 MG tablet Take one tablet twice a day for 14 days  30 tablet  0  . Multiple Vitamin (MULTIVITAMIN) tablet Take 1 tablet by mouth daily.        . naproxen sodium (ANAPROX)  220 MG tablet Take 220 mg by mouth. Couple of time a week as needed       . omeprazole (PRILOSEC) 40 MG capsule TAKE 1 CAPSULE (40 MG TOTAL) BY MOUTH DAILY.  30 capsule  6  . PARoxetine (PAXIL) 20 MG tablet Take 10 mg by mouth every morning.       Marland Kitchen VITAMIN A PO Take 1 capsule by mouth 2 (two) times daily.      . vitamin C (ASCORBIC ACID) 500 MG tablet Take 500 mg by mouth 2 (two) times daily.      Marland Kitchen VITAMIN E PO Take 1 capsule by mouth 2 (two) times daily.      . zaleplon (SONATA) 10 MG capsule Take 10 mg by mouth at bedtime as needed.        No Known Allergies Patient Active Problem List  Diagnosis  . Diverticulitis  . HTN (hypertension)  . Diabetes mellitus  . Colon polyps   History  Substance Use Topics  . Smoking status: Never Smoker   . Smokeless  tobacco: Never Used  . Alcohol Use: No     Objective:   Physical Exam well-developed white female in no acute distress, pleasant blood pressure 130/70 pulse 72 height 5 foot 4 weight 216. HEENT; nontraumatic normocephalic EOMI PERRLA sclera anicteric, Neck;Supple no JVD, Cardiovascular; regular rate and rhythm with S1-S2 no murmur or gallop, Pulmonary; clear bilaterally, Abdomen; soft, large, bowel sounds are active she is tender in the left mid quadrant and left lower quadrant with some early guarding no rebound, no palpable mass or hepatosplenomegaly, Rectal ; not done, Extremities; no clubbing cyanosis or edema skin warm and dry, Psych; mood and affect normal and appropriate.        Assessment & Plan:  #67 61 year old female with known severe left-sided diverticulosis, now presenting with a recurrent episode of diverticulitis. This is her third episode this year, she has never required hospitalization for diverticulitis or had a complication. #2 history of adenomatous colon polyps will be due for followup 2017 #3 history of post polypectomy bleed May 2012 #4.onset diabetes mellitus #5 hypertension  Plan; start Cipro 500 mg twice daily x10 days, start Flagyl 500 mg by mouth twice daily x10 days Offered and analgesic which she declined, she will use Tylenol as needed for pain Discussed liberal fluid intake and light diet until her pain is improved Patient will call back when she finishes the antibiotics if her symptoms are not completely resolved and/or for pain worsens in the interim, and at that time she would need a CT scan abdomen and pelvis. We discussed possible future referral to a surgeon for an elective resection which she is not interested in pursuing at this time. Patient to avoid nuts and popcorn.

## 2012-07-17 NOTE — Patient Instructions (Addendum)
We sent prescriptions for Cipro and Metronidazole ( Flagyl) to [facility] [address]. No more nuts or popcorn. Push fluids. Call back after finishing the antibiotics if you are not significantly better.

## 2012-07-18 ENCOUNTER — Telehealth: Payer: Self-pay | Admitting: Physician Assistant

## 2012-07-18 NOTE — Progress Notes (Signed)
I agree with assessment and plans 

## 2012-07-18 NOTE — Telephone Encounter (Signed)
Called pt to advise Amy does want her to take the Cipro twice daily for 10 days and the Flagyl twice daily for 10 days.  She thanked me for calling her back.

## 2012-09-08 ENCOUNTER — Ambulatory Visit (INDEPENDENT_AMBULATORY_CARE_PROVIDER_SITE_OTHER): Payer: PRIVATE HEALTH INSURANCE | Admitting: Emergency Medicine

## 2012-09-08 VITALS — BP 148/71 | HR 78 | Temp 97.7°F | Resp 20 | Ht 64.25 in | Wt 213.6 lb

## 2012-09-08 DIAGNOSIS — L03039 Cellulitis of unspecified toe: Secondary | ICD-10-CM

## 2012-09-08 DIAGNOSIS — IMO0002 Reserved for concepts with insufficient information to code with codable children: Secondary | ICD-10-CM

## 2012-09-08 DIAGNOSIS — L02619 Cutaneous abscess of unspecified foot: Secondary | ICD-10-CM

## 2012-09-08 MED ORDER — SULFAMETHOXAZOLE-TRIMETHOPRIM 800-160 MG PO TABS: 1.0000 | ORAL_TABLET | Freq: Two times a day (BID) | ORAL | Status: DC

## 2012-09-08 MED ORDER — CLINDAMYCIN HCL 300 MG PO CAPS: 300.0000 mg | ORAL_CAPSULE | Freq: Three times a day (TID) | ORAL | Status: DC

## 2012-09-08 NOTE — Patient Instructions (Signed)

## 2012-09-08 NOTE — Progress Notes (Signed)
[name] [address], [place] [address] [phone]  Date:  09/08/2012   Name:  Janice [name]   DOB:  December 17, 1950   MRN:  [id number]  PCP:  Hoyle Sauer, MD    Chief Complaint: Ingrown Toenail   History of Present Illness:  Janice [name] is a 61 y.o. very pleasant female patient who presents with the following:  Type 2 DM insulin dependent.  Missed doses of insulin with the holidays and ate inappropriately.  Noticed a hemorrhagic blister on the side of her left third toe that is associated with cellulitis and marked pain.  No history of injury.  No fever or chills.  No other complaints.  No neuropathy.  Patient Active Problem List  Diagnosis  . Diverticulitis  . HTN (hypertension)  . Diabetes mellitus  . Colon polyps    Past Medical History  Diagnosis Date  . Depression   . History of colon polyps     hyperplastic  . DDD (degenerative disc disease)   . Hypertension   . Sleep apnea, obstructive   . Diabetic peripheral neuropathy   . Common migraine   . Diabetes mellitus   . GERD (gastroesophageal reflux disease)   . DJD (degenerative joint disease)   . Obesity   . Diverticulosis   . Diverticulitis     Past Surgical History  Procedure Date  . Ankle surgery     Left   . Shoulder surgery     History  Substance Use Topics  . Smoking status: Never Smoker   . Smokeless tobacco: Never Used  . Alcohol Use: No    Family History  Problem Relation Age of Onset  . Breast cancer Mother   . Bone cancer Mother   . Heart failure Father   . Colon cancer Neg Hx     No Known Allergies  Medication list has been reviewed and updated.  Current Outpatient Prescriptions on File Prior to Visit  Medication Sig Dispense Refill  . acetaminophen (TYLENOL) 500 MG tablet Take 500 mg by mouth every 6 (six) hours as needed.        . Calcium Carb-Cholecalciferol (CALCIUM 1000 + D PO) Take by mouth.        . ergotamine-caffeine  (CAFERGOT) 1-100 MG per tablet Take 1 tablet by mouth once. As needed       . estradiol (ESTRACE) 0.1 MG/GM vaginal cream Place 2 g vaginally daily.        . Flaxseed, Linseed, OIL Take by mouth. Twice daily       . fluticasone (VERAMYST) 27.5 MCG/SPRAY nasal spray Place 2 sprays into the nose as needed.       . gabapentin (NEURONTIN) 300 MG capsule Take 300 mg by mouth at bedtime.      . Insulin Aspart Prot & Aspart (NOVOLOG MIX 70/30 FLEXPEN [place]) Inject into the skin. 65 units in the morning, 35 units at lunch, and 80 units at dinner or as directed      . lisinopril (PRINIVIL,ZESTRIL) 40 MG tablet Take 20 mg by mouth daily.       . metFORMIN (GLUCOPHAGE XR) 500 MG 24 hr tablet Take 500 mg by mouth. 2 pills in the morning and 2 pills at night      . metoCLOPramide (REGLAN) 10 MG tablet Take 10 mg by mouth. As needed       . Multiple Vitamin (MULTIVITAMIN) tablet Take 1 tablet by mouth daily.        Marland Kitchen  naproxen sodium (ANAPROX) 220 MG tablet Take 220 mg by mouth. Couple of time a week as needed       . omeprazole (PRILOSEC) 40 MG capsule TAKE 1 CAPSULE (40 MG TOTAL) BY MOUTH DAILY.  30 capsule  6  . PARoxetine (PAXIL) 20 MG tablet Take 10 mg by mouth every morning.       Marland Kitchen VITAMIN A PO Take 1 capsule by mouth 2 (two) times daily.      . vitamin C (ASCORBIC ACID) 500 MG tablet Take 500 mg by mouth 2 (two) times daily.      Marland Kitchen VITAMIN E PO Take 1 capsule by mouth 2 (two) times daily.      . zaleplon (SONATA) 10 MG capsule Take 10 mg by mouth at bedtime as needed.       . ciprofloxacin (CIPRO) 500 MG tablet Take one tablet twice a day for 14 days  30 tablet  0  . metroNIDAZOLE (FLAGYL) 500 MG tablet Take one tablet twice a day for 14 days  30 tablet  0    Review of Systems:  As per HPI, otherwise negative.    Physical Examination: Filed Vitals:   09/08/12 1737  BP: 148/71  Pulse: 78  Temp: 97.7 F (36.5 C)  Resp: 20   Filed Vitals:   09/08/12 1737  Height: 5' 4.25" (1.632 m)  Weight:  213 lb 9.6 oz (96.888 kg)   Body mass index is 36.38 kg/(m^2). Ideal Body Weight: Weight in (lb) to have BMI = 25: 146.5    GEN: WDWN, NAD, Non-toxic, Alert & Oriented x 3 HEENT: Atraumatic, Normocephalic.  Ears and Nose: No external deformity. EXTR: No clubbing/cyanosis/edema NEURO: Normal gait.  PSYCH: Normally interactive. Conversant. Not depressed or anxious appearing.  Calm demeanor.  Left Foot:  Hemorrhagic bullae on left third toe.  Some cellulitis on dorsum of foot.  Pulses:  PT  2+  DP +1  Assessment and Plan: Cellulitis foot Type 2 DM Clindamycin cipro Follow up Monday  Carmelina Dane, MD

## 2012-09-10 ENCOUNTER — Ambulatory Visit (INDEPENDENT_AMBULATORY_CARE_PROVIDER_SITE_OTHER): Payer: PRIVATE HEALTH INSURANCE | Admitting: Emergency Medicine

## 2012-09-10 VITALS — BP 137/82 | HR 76 | Temp 98.5°F | Resp 18 | Ht 64.5 in | Wt 213.0 lb

## 2012-09-10 DIAGNOSIS — L02619 Cutaneous abscess of unspecified foot: Secondary | ICD-10-CM

## 2012-09-10 DIAGNOSIS — L03039 Cellulitis of unspecified toe: Secondary | ICD-10-CM

## 2012-09-10 DIAGNOSIS — E119 Type 2 diabetes mellitus without complications: Secondary | ICD-10-CM

## 2012-09-10 NOTE — Progress Notes (Signed)
[facility] [address] [place] [address] [phone]  Date:  09/10/2012   Name:  Jadon Ressler   DOB:  [birth year]   MRN:  [id number]  PCP:  Hoyle Sauer, MD    Chief Complaint: Cellulitis   History of Present Illness:  Shenna Brissette is a 61 y.o. very pleasant female patient who presents with the following:  Seen 12/28 with infected left third toe.  Put on clindamycin and cipro.  Has less pain and tenderness, less swelling.  Still has hemorrhagic bulla.  No fever or chills  Patient Active Problem List  Diagnosis  . Diverticulitis  . HTN (hypertension)  . Diabetes mellitus  . Colon polyps    Past Medical History  Diagnosis Date  . Depression   . History of colon polyps     hyperplastic  . DDD (degenerative disc disease)   . Hypertension   . Sleep apnea, obstructive   . Diabetic peripheral neuropathy   . Common migraine   . Diabetes mellitus   . GERD (gastroesophageal reflux disease)   . DJD (degenerative joint disease)   . Obesity   . Diverticulosis   . Diverticulitis     Past Surgical History  Procedure Date  . Ankle surgery     Left   . Shoulder surgery     History  Substance Use Topics  . Smoking status: Never Smoker   . Smokeless tobacco: Never Used  . Alcohol Use: No    Family History  Problem Relation Age of Onset  . Breast cancer Mother   . Bone cancer Mother   . Heart failure Father   . Colon cancer Neg Hx     No Known Allergies  Medication list has been reviewed and updated.  Current Outpatient Prescriptions on File Prior to Visit  Medication Sig Dispense Refill  . acetaminophen (TYLENOL) 500 MG tablet Take 500 mg by mouth every 6 (six) hours as needed.        . Calcium Carb-Cholecalciferol (CALCIUM 1000 + D PO) Take by mouth.        . ciprofloxacin (CIPRO) 500 MG tablet Take one tablet twice a day for 14 days  30 tablet  0  . clindamycin (CLEOCIN) 300 MG capsule Take 1 capsule (300 mg total)  by mouth 3 (three) times daily.  30 capsule  0  . ergotamine-caffeine (CAFERGOT) 1-100 MG per tablet Take 1 tablet by mouth once. As needed       . estradiol (ESTRACE) 0.1 MG/GM vaginal cream Place 2 g vaginally daily.        . Flaxseed, Linseed, OIL Take by mouth. Twice daily       . fluticasone (VERAMYST) 27.5 MCG/SPRAY nasal spray Place 2 sprays into the nose as needed.       . gabapentin (NEURONTIN) 300 MG capsule Take 300 mg by mouth at bedtime.      . Insulin Aspart Prot & Aspart (NOVOLOG MIX 70/30 FLEXPEN [place]) Inject into the skin. 65 units in the morning, 35 units at lunch, and 80 units at dinner or as directed      . lisinopril (PRINIVIL,ZESTRIL) 40 MG tablet Take 20 mg by mouth daily.       . metFORMIN (GLUCOPHAGE XR) 500 MG 24 hr tablet Take 500 mg by mouth. 2 pills in the morning and 2 pills at night      . metoCLOPramide (REGLAN) 10 MG tablet Take 10 mg by mouth. As  needed       . metroNIDAZOLE (FLAGYL) 500 MG tablet Take one tablet twice a day for 14 days  30 tablet  0  . Multiple Vitamin (MULTIVITAMIN) tablet Take 1 tablet by mouth daily.        . naproxen sodium (ANAPROX) 220 MG tablet Take 220 mg by mouth. Couple of time a week as needed       . omeprazole (PRILOSEC) 40 MG capsule TAKE 1 CAPSULE (40 MG TOTAL) BY MOUTH DAILY.  30 capsule  6  . PARoxetine (PAXIL) 20 MG tablet Take 10 mg by mouth every morning.       . sulfamethoxazole-trimethoprim (BACTRIM DS,SEPTRA DS) 800-160 MG per tablet Take 1 tablet by mouth 2 (two) times daily.  20 tablet  0  . VITAMIN A PO Take 1 capsule by mouth 2 (two) times daily.      . vitamin C (ASCORBIC ACID) 500 MG tablet Take 500 mg by mouth 2 (two) times daily.      Marland Kitchen VITAMIN E PO Take 1 capsule by mouth 2 (two) times daily.      . zaleplon (SONATA) 10 MG capsule Take 10 mg by mouth at bedtime as needed.         Review of Systems:  As per HPI, otherwise negative.    Physical Examination: Filed Vitals:   09/10/12 1419  BP: 137/82  Pulse:  76  Temp: 98.5 F (36.9 C)  Resp: 18   Filed Vitals:   09/10/12 1419  Height: 5' 4.5" (1.638 m)  Weight: 213 lb (96.616 kg)   Body mass index is 36.00 kg/(m^2). Ideal Body Weight: Weight in (lb) to have BMI = 25: 147.6    GEN: WDWN, NAD, Non-toxic, Alert & Oriented x 3 HEENT: Atraumatic, Normocephalic.  Ears and Nose: No external deformity. EXTR: No clubbing/cyanosis/edema NEURO: Normal gait.  PSYCH: Normally interactive. Conversant. Not depressed or anxious appearing.  Calm demeanor.  Left Foot:  Cellulitis left third toe with hemorrhagic bulla on lateral aspect distal phalange.  Assessment and Plan: Cellulitis toe Continue elevation and antibiotics Follow up in two days  Carmelina Dane, MD

## 2012-09-12 LAB — WOUND CULTURE
Gram Stain: NONE SEEN
Gram Stain: NONE SEEN

## 2012-09-13 ENCOUNTER — Ambulatory Visit (INDEPENDENT_AMBULATORY_CARE_PROVIDER_SITE_OTHER): Payer: PRIVATE HEALTH INSURANCE | Admitting: Emergency Medicine

## 2012-09-13 VITALS — BP 137/72 | HR 84 | Temp 98.5°F | Resp 16

## 2012-09-13 DIAGNOSIS — L02619 Cutaneous abscess of unspecified foot: Secondary | ICD-10-CM

## 2012-09-13 DIAGNOSIS — L03039 Cellulitis of unspecified toe: Secondary | ICD-10-CM

## 2012-09-13 NOTE — Progress Notes (Signed)
[facility] [address], [place] [address] [phone]  Date:  09/13/2012   Name:  Janice [name]   DOB:  [birth year]   MRN:  [id number]  PCP:  Hoyle Sauer, MD    Chief Complaint: Wound Check   History of Present Illness:  [name] is a 62 y.o. very pleasant female patient who presents with the following:  Initially seen on 12/28 with infection of left third toe.  Has progressed very well with resolution of cellulitis on foot and marked improvement in pain.  No fever or chills.  Patient Active Problem List  Diagnosis  . Diverticulitis  . HTN (hypertension)  . Diabetes mellitus  . Colon polyps    Past Medical History  Diagnosis Date  . Depression   . History of colon polyps     hyperplastic  . DDD (degenerative disc disease)   . Hypertension   . Sleep apnea, obstructive   . Diabetic peripheral neuropathy   . Common migraine   . Diabetes mellitus   . GERD (gastroesophageal reflux disease)   . DJD (degenerative joint disease)   . Obesity   . Diverticulosis   . Diverticulitis     Past Surgical History  Procedure Date  . Ankle surgery     Left   . Shoulder surgery     History  Substance Use Topics  . Smoking status: Never Smoker   . Smokeless tobacco: Never Used  . Alcohol Use: No    Family History  Problem Relation Age of Onset  . Breast cancer Mother   . Bone cancer Mother   . Heart failure Father   . Colon cancer Neg Hx     No Known Allergies  Medication list has been reviewed and updated.  Current Outpatient Prescriptions on File Prior to Visit  Medication Sig Dispense Refill  . acetaminophen (TYLENOL) 500 MG tablet Take 500 mg by mouth every 6 (six) hours as needed.        . Calcium Carb-Cholecalciferol (CALCIUM 1000 + D PO) Take by mouth.        . ciprofloxacin (CIPRO) 500 MG tablet Take one tablet twice a day for 14 days  30 tablet  0  . clindamycin (CLEOCIN) 300 MG capsule Take 1 capsule (300 mg  total) by mouth 3 (three) times daily.  30 capsule  0  . ergotamine-caffeine (CAFERGOT) 1-100 MG per tablet Take 1 tablet by mouth once. As needed       . estradiol (ESTRACE) 0.1 MG/GM vaginal cream Place 2 g vaginally daily.        . Flaxseed, Linseed, OIL Take by mouth. Twice daily       . fluticasone (VERAMYST) 27.5 MCG/SPRAY nasal spray Place 2 sprays into the nose as needed.       . gabapentin (NEURONTIN) 300 MG capsule Take 300 mg by mouth at bedtime.      . Insulin Aspart Prot & Aspart (NOVOLOG MIX 70/30 FLEXPEN [place]) Inject into the skin. 65 units in the morning, 35 units at lunch, and 80 units at dinner or as directed      . lisinopril (PRINIVIL,ZESTRIL) 40 MG tablet Take 20 mg by mouth daily.       . metFORMIN (GLUCOPHAGE XR) 500 MG 24 hr tablet Take 500 mg by mouth. 2 pills in the morning and 2 pills at night      . metoCLOPramide (REGLAN) 10 MG tablet Take 10 mg by mouth.  As needed       . metroNIDAZOLE (FLAGYL) 500 MG tablet Take one tablet twice a day for 14 days  30 tablet  0  . Multiple Vitamin (MULTIVITAMIN) tablet Take 1 tablet by mouth daily.        . naproxen sodium (ANAPROX) 220 MG tablet Take 220 mg by mouth. Couple of time a week as needed       . omeprazole (PRILOSEC) 40 MG capsule TAKE 1 CAPSULE (40 MG TOTAL) BY MOUTH DAILY.  30 capsule  6  . PARoxetine (PAXIL) 20 MG tablet Take 10 mg by mouth every morning.       . sulfamethoxazole-trimethoprim (BACTRIM DS,SEPTRA DS) 800-160 MG per tablet Take 1 tablet by mouth 2 (two) times daily.  20 tablet  0  . VITAMIN A PO Take 1 capsule by mouth 2 (two) times daily.      . vitamin C (ASCORBIC ACID) 500 MG tablet Take 500 mg by mouth 2 (two) times daily.      Marland Kitchen VITAMIN E PO Take 1 capsule by mouth 2 (two) times daily.      . zaleplon (SONATA) 10 MG capsule Take 10 mg by mouth at bedtime as needed.         Review of Systems:  As per HPI, otherwise negative.    Physical Examination: Filed Vitals:   09/13/12 1810  BP: 137/72    Pulse: 84  Temp: 98.5 F (36.9 C)  Resp: 16   There were no vitals filed for this visit. There is no height or weight on file to calculate BMI. Ideal Body Weight:     GEN: WDWN, NAD, Non-toxic, Alert & Oriented x 3 HEENT: Atraumatic, Normocephalic.  Ears and Nose: No external deformity. EXTR: No clubbing/cyanosis/edema NEURO: Normal gait.  PSYCH: Normally interactive. Conversant. Not depressed or anxious appearing.  Calm demeanor.  Marked improvement in abscess with resolution of cellulitis and less tenderness  Assessment and Plan: Abscess toe Continue antibiotics Follow up 1 week Continue elevation  Carmelina Dane, MD

## 2012-09-19 ENCOUNTER — Telehealth: Payer: Self-pay | Admitting: Internal Medicine

## 2012-09-19 MED ORDER — OMEPRAZOLE 40 MG PO CPDR: 40.0000 mg | DELAYED_RELEASE_CAPSULE | Freq: Every day | ORAL | Status: DC

## 2012-09-19 NOTE — Telephone Encounter (Signed)
Refilled Omeprazole 

## 2012-10-31 ENCOUNTER — Telehealth: Payer: Self-pay | Admitting: Radiology

## 2012-10-31 NOTE — Telephone Encounter (Signed)
Spoke to patient about her lab/ wound culture from Dec. She has gotten a bill and did not know what it was for. Advised her the culture grew MRSA and she was on correct antibiotic.

## 2013-01-12 ENCOUNTER — Ambulatory Visit: Payer: PRIVATE HEALTH INSURANCE

## 2013-01-12 ENCOUNTER — Ambulatory Visit (INDEPENDENT_AMBULATORY_CARE_PROVIDER_SITE_OTHER): Payer: PRIVATE HEALTH INSURANCE | Admitting: Family Medicine

## 2013-01-12 VITALS — BP 151/81 | HR 82 | Temp 98.3°F | Resp 16 | Ht 64.5 in | Wt 218.6 lb

## 2013-01-12 DIAGNOSIS — J45901 Unspecified asthma with (acute) exacerbation: Secondary | ICD-10-CM

## 2013-01-12 DIAGNOSIS — R059 Cough, unspecified: Secondary | ICD-10-CM

## 2013-01-12 DIAGNOSIS — IMO0001 Reserved for inherently not codable concepts without codable children: Secondary | ICD-10-CM

## 2013-01-12 DIAGNOSIS — R062 Wheezing: Secondary | ICD-10-CM

## 2013-01-12 DIAGNOSIS — J45909 Unspecified asthma, uncomplicated: Secondary | ICD-10-CM

## 2013-01-12 DIAGNOSIS — R05 Cough: Secondary | ICD-10-CM

## 2013-01-12 LAB — POCT CBC
Granulocyte percent: 65.6 %G (ref 37–80)
HCT, POC: 41.1 % (ref 37.7–47.9)
Hemoglobin: 12.6 g/dL (ref 12.2–16.2)
Lymph, poc: 1.4 (ref 0.6–3.4)
MCH, POC: 26.9 pg — AB (ref 27–31.2)
MCHC: 30.7 g/dL — AB (ref 31.8–35.4)
MCV: 87.6 fL (ref 80–97)
MID (cbc): 0.6 (ref 0–0.9)
MPV: 9.1 fL (ref 0–99.8)
POC Granulocyte: 3.7 (ref 2–6.9)
POC LYMPH PERCENT: 24.5 %L (ref 10–50)
POC MID %: 9.9 %M (ref 0–12)
Platelet Count, POC: 139 10*3/uL — AB (ref 142–424)
RBC: 4.69 M/uL (ref 4.04–5.48)
RDW, POC: 15.1 %
WBC: 5.7 10*3/uL (ref 4.6–10.2)

## 2013-01-12 LAB — GLUCOSE, POCT (MANUAL RESULT ENTRY): POC Glucose: 188 mg/dl — AB (ref 70–99)

## 2013-01-12 MED ORDER — HYDROCODONE-HOMATROPINE 5-1.5 MG/5ML PO SYRP: 5.0000 mL | ORAL_SOLUTION | ORAL | Status: DC | PRN

## 2013-01-12 MED ORDER — ALBUTEROL SULFATE (2.5 MG/3ML) 0.083% IN NEBU: 2.5000 mg | INHALATION_SOLUTION | Freq: Once | RESPIRATORY_TRACT | Status: DC

## 2013-01-12 MED ORDER — PREDNISONE 20 MG PO TABS: ORAL_TABLET | ORAL | Status: DC

## 2013-01-12 MED ORDER — ALBUTEROL SULFATE HFA 108 (90 BASE) MCG/ACT IN AERS: 2.0000 | INHALATION_SPRAY | Freq: Four times a day (QID) | RESPIRATORY_TRACT | Status: DC | PRN

## 2013-01-12 NOTE — Patient Instructions (Addendum)
Drink lots of fluids  Get plenty of rest  Take the cough syrup as needed for severe coughing  Use the albuterol inhaler, 2 puffs every 4-6 hours, until you breathing is doing better  Take the prednisone 3 tablets today at 3 tablets tomorrow, then discontinue it. It will raise your blood sugar transiently. Be careful on your carbohydrate and sweet intake on this.

## 2013-01-12 NOTE — Progress Notes (Signed)
Subjective:  62 year old lady who has been ill for about 5 days with a cough. At times she is wheezing, especially this morning. Actually it started with a lot of sinus pressure in her maxillary region. She began on Tuesday taking her husbands of Z-Pak he took the full course, effacing the S1 today. She she is feeling better, but then this morning was worse. She does not smoke. She has taken some Mucinex. She was unclear stuff out of her nose and coughing up yellow phlegm, but today she is tight she's not been able to cough stuff up.  Objective: Afebrile. Her TMs are normal. Some wax both canals. Throat was clear. Neck supple without nodes thyromegaly. She's tender over maxillary and frontal sinus is heart is regular without murmurs. Chest is movement without any clear rhonchi rales or wheezes.  Peak flow 280 with a predicted of 400.  Assessment: Cough Wheeze   Plan: The nebulizer with albuterol CBC Chest x-ray  [facility] reading (PRIMARY) by  Dr. Alwyn Ren Normal cxr  Results for orders placed in visit on 01/12/13  POCT CBC      Result Value Range   WBC 5.7  4.6 - 10.2 K/uL   Lymph, poc 1.4  0.6 - 3.4   POC LYMPH PERCENT 24.5  10 - 50 %L   MID (cbc) 0.6  0 - 0.9   POC MID % 9.9  0 - 12 %M   POC Granulocyte 3.7  2 - 6.9   Granulocyte percent 65.6  37 - 80 %G   RBC 4.69  4.04 - 5.48 M/uL   Hemoglobin 12.6  12.2 - 16.2 g/dL   HCT, POC 16.1  09.6 - 47.9 %   MCV 87.6  80 - 97 fL   MCH, POC 26.9 (*) 27 - 31.2 pg   MCHC 30.7 (*) 31.8 - 35.4 g/dL   RDW, POC 04.5     Platelet Count, POC 139 (*) 142 - 424 K/uL   MPV 9.1  0 - 99.8 fL    Repeat peak flow was 300.  Prescribed48medications for her. Albuterol inhaler, Hycodan, and 2 days of prednisone. I cautioned her that prednisone would raise her sugar transiently so she needs to behave and what she is eating. Go to the ER if worse.

## 2013-01-31 ENCOUNTER — Other Ambulatory Visit: Payer: Self-pay | Admitting: Obstetrics and Gynecology

## 2013-07-30 ENCOUNTER — Telehealth: Payer: Self-pay | Admitting: Internal Medicine

## 2013-07-30 NOTE — Telephone Encounter (Signed)
Pt states she was told by her PCP to increase her Prilosec 40mg  to BID to help with possible "silent reflux" that was causing her to have a stuffy nose and therefore she could not wear her CPAP machine. Pt states she did start taking it BID but wanted to know if Dr. Marina Goodell agrees with this and thinks it is safe for her to do. Please advise.

## 2013-07-31 NOTE — Telephone Encounter (Signed)
I don't know whether this will help her symptoms or not, but short-term this should be OK. Either way, she should followup with her PCP who made the recommendation

## 2013-07-31 NOTE — Telephone Encounter (Signed)
Spoke with pt and she is aware.

## 2013-09-28 ENCOUNTER — Ambulatory Visit (INDEPENDENT_AMBULATORY_CARE_PROVIDER_SITE_OTHER): Payer: Managed Care, Other (non HMO) | Admitting: Emergency Medicine

## 2013-09-28 ENCOUNTER — Ambulatory Visit: Payer: Managed Care, Other (non HMO)

## 2013-09-28 VITALS — BP 134/74 | HR 77 | Temp 100.2°F | Resp 18

## 2013-09-28 DIAGNOSIS — R059 Cough, unspecified: Secondary | ICD-10-CM

## 2013-09-28 DIAGNOSIS — R05 Cough: Secondary | ICD-10-CM

## 2013-09-28 DIAGNOSIS — R509 Fever, unspecified: Secondary | ICD-10-CM

## 2013-09-28 LAB — POCT CBC
Granulocyte percent: 69.1 %G (ref 37–80)
HCT, POC: 42.7 % (ref 37.7–47.9)
Hemoglobin: 13.1 g/dL (ref 12.2–16.2)
Lymph, poc: 1.2 (ref 0.6–3.4)
MCH, POC: 27.6 pg (ref 27–31.2)
MCHC: 30.7 g/dL — AB (ref 31.8–35.4)
MCV: 89.8 fL (ref 80–97)
MID (cbc): 0.6 (ref 0–0.9)
MPV: 9 fL (ref 0–99.8)
POC Granulocyte: 4 (ref 2–6.9)
POC LYMPH PERCENT: 21.2 %L (ref 10–50)
POC MID %: 9.7 %M (ref 0–12)
Platelet Count, POC: 138 10*3/uL — AB (ref 142–424)
RBC: 4.75 M/uL (ref 4.04–5.48)
RDW, POC: 14.9 %
WBC: 5.8 10*3/uL (ref 4.6–10.2)

## 2013-09-28 LAB — POCT RAPID STREP A (OFFICE): Rapid Strep A Screen: NEGATIVE

## 2013-09-28 LAB — POCT INFLUENZA A/B
Influenza A, POC: NEGATIVE
Influenza B, POC: NEGATIVE

## 2013-09-28 MED ORDER — BENZONATATE 100 MG PO CAPS: ORAL_CAPSULE | ORAL | Status: DC

## 2013-09-28 MED ORDER — AZITHROMYCIN 250 MG PO TABS: ORAL_TABLET | ORAL | Status: DC

## 2013-09-28 NOTE — Patient Instructions (Signed)

## 2013-09-28 NOTE — Progress Notes (Addendum)
Subjective:   This chart was scribed for Nena Jordan MD by Forrestine Him, ED Scribe. This patient was seen in room Room 8 and the patient's care was started 11:25 AM.    Patient ID: Janice [name], female    DOB: [birth year], 63 y.o.   MRN: [id number]  HPI  HPI Comments: Janice [name] is a 63 y.o. Female with multiple medical problems including a PMHx of asthmatic bronchitis who presents to [facility] complaining of gradual onset, ongoing, moderate SOB that initially started this morning. She also reports a productive cough, fever, and sore throat onset 2 days. She has tried albuterol with mild improvement. Denies any aggravating factors for her current symptoms. Pt states her husband was recently sick a few weeks ago, otherwise she denies any other sick contacts. She denies any chills. She denies being a smoker. She denies a h/o asthma.  Review of Systems  Constitutional: Positive for fever.  HENT: Positive for sore throat.   Respiratory: Positive for cough and shortness of breath.     Past Medical History  Diagnosis Date  . Depression   . History of colon polyps     hyperplastic  . DDD (degenerative disc disease)   . Hypertension   . Sleep apnea, obstructive   . Diabetic peripheral neuropathy   . Common migraine   . Diabetes mellitus   . GERD (gastroesophageal reflux disease)   . DJD (degenerative joint disease)   . Obesity   . Diverticulosis   . Diverticulitis   . Allergy     History   Social History  . Marital Status: Married    Spouse Name: N/A    Number of Children: 0  . Years of Education: N/A   Occupational History  . Unemployed    Social History Main Topics  . Smoking status: Never Smoker   . Smokeless tobacco: Never Used  . Alcohol Use: No  . Drug Use: No  . Sexual Activity: No   Other Topics Concern  . Not on file   Social History Narrative   1 caffeine drink daily    Past Surgical History  Procedure Laterality Date  . Ankle surgery      Left    . Shoulder surgery      Triage Vitals: BP 134/74  Pulse 77  Temp(Src) 100.2 F (37.9 C) (Oral)  Resp 18  SpO2 95%   Objective:   Physical Exam  CONSTITUTIONAL: Well developed/well nourished HEAD: Normocephalic/atraumatic EYES: EOMI/PERRL ENMT: Mucous membranes moist there is redness of the posterior pharynx. NECK: supple no meningeal signs SPINE:entire spine nontender CV: S1/S2 noted, no murmurs/rubs/gallops noted LUNGS: Lungs are clear to auscultation bilaterally, no apparent distress breath sounds were symmetrical I did not hear any auditory wheezes ABDOMEN: soft, nontender, no rebound or guarding GU:no cva tenderness NEURO: Pt is awake/alert, moves all extremitiesx4 EXTREMITIES: pulses normal, full ROM SKIN: warm, color normal PSYCH: no abnormalities of mood noted  Results for orders placed in visit on 09/28/13  POCT CBC      Result Value Range   WBC 5.8  4.6 - 10.2 K/uL   Lymph, poc 1.2  0.6 - 3.4   POC LYMPH PERCENT 21.2  10 - 50 %L   MID (cbc) 0.6  0 - 0.9   POC MID % 9.7  0 - 12 %M   POC Granulocyte 4.0  2 - 6.9   Granulocyte percent 69.1  37 - 80 %G   RBC 4.75  4.04 -  5.48 M/uL   Hemoglobin 13.1  12.2 - 16.2 g/dL   HCT, POC 42.7  37.7 - 47.9 %   MCV 89.8  80 - 97 fL   MCH, POC 27.6  27 - 31.2 pg   MCHC 30.7 (*) 31.8 - 35.4 g/dL   RDW, POC 14.9     Platelet Count, POC 138 (*) 142 - 424 K/uL   MPV 9.0  0 - 99.8 fL  POCT INFLUENZA A/B      Result Value Range   Influenza A, POC Negative     Influenza B, POC Negative    POCT RAPID STREP A (OFFICE)      Result Value Range   Rapid Strep A Screen Negative  Negative  [facility] reading (PRIMARY) by  Dr. Everlene Farrier there is a 4 mm nodule in the right and a 4-5 mm nodule on the left probably secondary to granulomas. Please comment    Assessment & Plan:  I suspect this is a flulike illness with secondary bronchitis. She's had some purulent nasal drainage and a blood-tinged sputum today. There is a questionable nodular area  seen on chest x-ray we'll see what the radiologist thinks about this. We will treat with Ladona Ridgel and Z-Pak

## 2013-10-03 ENCOUNTER — Other Ambulatory Visit: Payer: Self-pay

## 2013-10-03 DIAGNOSIS — R05 Cough: Secondary | ICD-10-CM

## 2013-10-03 DIAGNOSIS — R059 Cough, unspecified: Secondary | ICD-10-CM

## 2013-10-03 NOTE — Telephone Encounter (Signed)
Patient was seen recently on Jan 17 by Dr Everlene Farrier. Said they were waiting on radiologist to review chest x-ray because of possible nodules seen. Wants to know what the radiologist said about her CXR. Cb# [phone]

## 2013-10-04 MED ORDER — HYDROCODONE-HOMATROPINE 5-1.5 MG/5ML PO SYRP: 5.0000 mL | ORAL_SOLUTION | ORAL | Status: DC | PRN

## 2013-10-04 NOTE — Telephone Encounter (Signed)
Call patient and let her know the radiologist did not see any nodules or abnormalities and compared her films to the one done in May 2014

## 2013-10-04 NOTE — Telephone Encounter (Signed)
Pt CB and I gave her message from Dr Everlene Farrier re: radiologist report.   Pt reported that she is starting to get better but still coughing quite a bit and is not able to sleep at night. She requests RF of cough syrup that she was taking. She ran out of it last Friday. Dr Everlene Farrier, please review pended RF

## 2013-10-04 NOTE — Telephone Encounter (Signed)
Lm for rtn call 

## 2013-10-05 ENCOUNTER — Other Ambulatory Visit: Payer: Self-pay | Admitting: Emergency Medicine

## 2013-10-05 ENCOUNTER — Telehealth: Payer: Self-pay | Admitting: Emergency Medicine

## 2013-10-05 ENCOUNTER — Other Ambulatory Visit: Payer: Self-pay | Admitting: *Deleted

## 2013-10-05 DIAGNOSIS — R05 Cough: Secondary | ICD-10-CM

## 2013-10-05 DIAGNOSIS — R059 Cough, unspecified: Secondary | ICD-10-CM

## 2013-10-05 MED ORDER — HYDROCODONE-HOMATROPINE 5-1.5 MG/5ML PO SYRP: 5.0000 mL | ORAL_SOLUTION | ORAL | Status: DC | PRN

## 2013-10-05 NOTE — Telephone Encounter (Signed)
Pt notified that rx is ready for pickup.  Husband will pickup

## 2013-10-05 NOTE — Telephone Encounter (Signed)
I received an after hours call last night from the patient requesting Hycodan for her cough. I told her I would write a prescription when I went to the office Saturday morning.. this was done .

## 2013-10-10 ENCOUNTER — Telehealth: Payer: Self-pay

## 2013-10-10 DIAGNOSIS — R059 Cough, unspecified: Secondary | ICD-10-CM

## 2013-10-10 DIAGNOSIS — R05 Cough: Secondary | ICD-10-CM

## 2013-10-10 NOTE — Telephone Encounter (Signed)
Patient needs more of the cough syrup. Still coughing. Seen by Dr Everlene Farrier. Cb# [phone]

## 2013-10-13 MED ORDER — HYDROCODONE-HOMATROPINE 5-1.5 MG/5ML PO SYRP: 5.0000 mL | ORAL_SOLUTION | ORAL | Status: DC | PRN

## 2013-10-13 NOTE — Telephone Encounter (Signed)
Spoke with pt, advised to Rx ready to pick up,

## 2013-10-13 NOTE — Telephone Encounter (Signed)
Okay to refill the patient's Hycodan and I will sign it.

## 2013-12-07 ENCOUNTER — Telehealth: Payer: Self-pay

## 2013-12-07 ENCOUNTER — Ambulatory Visit (INDEPENDENT_AMBULATORY_CARE_PROVIDER_SITE_OTHER): Payer: Managed Care, Other (non HMO) | Admitting: Emergency Medicine

## 2013-12-07 VITALS — BP 120/80 | HR 79 | Temp 98.1°F | Resp 16 | Ht <= 58 in | Wt 212.0 lb

## 2013-12-07 DIAGNOSIS — J45901 Unspecified asthma with (acute) exacerbation: Secondary | ICD-10-CM | POA: Insufficient documentation

## 2013-12-07 DIAGNOSIS — R05 Cough: Secondary | ICD-10-CM

## 2013-12-07 DIAGNOSIS — R059 Cough, unspecified: Secondary | ICD-10-CM

## 2013-12-07 DIAGNOSIS — J45909 Unspecified asthma, uncomplicated: Secondary | ICD-10-CM

## 2013-12-07 DIAGNOSIS — R062 Wheezing: Secondary | ICD-10-CM

## 2013-12-07 LAB — POCT CBC
Granulocyte percent: 64.5 %G (ref 37–80)
HCT, POC: 39.5 % (ref 37.7–47.9)
Hemoglobin: 12.2 g/dL (ref 12.2–16.2)
Lymph, poc: 1.4 (ref 0.6–3.4)
MCH, POC: 27.4 pg (ref 27–31.2)
MCHC: 30.9 g/dL — AB (ref 31.8–35.4)
MCV: 88.8 fL (ref 80–97)
MID (cbc): 0.5 (ref 0–0.9)
MPV: 8.7 fL (ref 0–99.8)
POC Granulocyte: 3.5 (ref 2–6.9)
POC LYMPH PERCENT: 26.2 %L (ref 10–50)
POC MID %: 9.3 %M (ref 0–12)
Platelet Count, POC: 134 10*3/uL — AB (ref 142–424)
RBC: 4.45 M/uL (ref 4.04–5.48)
RDW, POC: 14.7 %
WBC: 5.4 10*3/uL (ref 4.6–10.2)

## 2013-12-07 MED ORDER — ALBUTEROL SULFATE HFA 108 (90 BASE) MCG/ACT IN AERS: 2.0000 | INHALATION_SPRAY | Freq: Four times a day (QID) | RESPIRATORY_TRACT | Status: DC | PRN

## 2013-12-07 MED ORDER — MOMETASONE FURO-FORMOTEROL FUM 200-5 MCG/ACT IN AERO: 2.0000 | INHALATION_SPRAY | Freq: Two times a day (BID) | RESPIRATORY_TRACT | Status: DC

## 2013-12-07 MED ORDER — IPRATROPIUM BROMIDE 0.02 % IN SOLN
0.5000 mg | Freq: Once | RESPIRATORY_TRACT | Status: AC
  Administered 2013-12-07: 0.5 mg via RESPIRATORY_TRACT

## 2013-12-07 MED ORDER — AZITHROMYCIN 250 MG PO TABS: ORAL_TABLET | ORAL | Status: DC

## 2013-12-07 MED ORDER — ALBUTEROL SULFATE (2.5 MG/3ML) 0.083% IN NEBU
2.5000 mg | INHALATION_SOLUTION | Freq: Once | RESPIRATORY_TRACT | Status: AC
  Administered 2013-12-07: 2.5 mg via RESPIRATORY_TRACT

## 2013-12-07 NOTE — Progress Notes (Signed)
   Subjective:    Patient ID: Janice [name], female    DOB: 1951/06/20, 63 y.o.   MRN: [id number]  HPI  63 YO female patient comes in today with complaints of a cough with wheezing. She has been unable to sleep. She has only been able to sleep a few hours while sitting up. The cough has been productive with a yellow sputum. Her cough has been so harsh that she has difficulty catching her breath.   She has been trying Musinex, her albuterol inhaler without relief.    Review of Systems     Objective:   Physical Exam        Assessment & Plan:

## 2013-12-07 NOTE — Patient Instructions (Signed)
Please check with Dr.Aava about a possible change of your blood pressure medication.

## 2013-12-07 NOTE — Telephone Encounter (Signed)
Patient calling back to speak to [facility] or his assistant. The instructions for dulera inhaler that Dr. Everlene Farrier told patient are conflicting with the pharmacy. Daub told patient to take inhaler every 2 hours and the pharmacy says only 2 times a day. Please advise. Thank you! Patient only has taken one dose of inhaler today so far and feels like she needs another.   Best: [phone]

## 2013-12-07 NOTE — Telephone Encounter (Signed)
lmom to cb. 

## 2013-12-07 NOTE — Telephone Encounter (Signed)
Pt notified by Dr. Everlene Farrier to take [facility] 2 puffs twice a day

## 2013-12-07 NOTE — Progress Notes (Addendum)
This chart was scribed by Stacy Gardner, [facility] Scribe. The patient was seen in room and the patient's care was started at 10:22 AM. PATIENT: Janice [name]  CHIEF COMPLAINT PER RN:  Chief Complaint  Patient presents with  . Cough    x 3 days with yellow mucus    HPI  Janice [name] is a 63 y.o. female who presents to [facility] complaining of persistent cough since Jan. Pt has a postnasal drip, onset Sunday. Pt cough onset wed. The cough is worse with laying. Yesterday, pt used her inhaler due to cough worsening. She has also tried Mucinex to no relief.  Pt mentions that if feels like "everything is closing up". She has associated wheezing. Pt was unable to sleep last night due to wheezing and coughing. Denies hx of COPD. Pt has a prior speculated dx of asthmatic bronchitis. Pt denies having asthma. She went to her PCP and for the same complaint but she reports that he did not do anything.  She is unable to go to her PCP because she has to take care of her husband after his knee surgery. Nothing seems to make her symptoms better. Pt denies smoking tobacco. She reports spreading straw a few months ago for a pet. Denies any new detergents, soaps or pets.  Pt PCP is Dr. Shanda Bumps PAST MEDICAL HISTORY:  Past Medical History  Diagnosis Date  . Depression   . History of colon polyps     hyperplastic  . DDD (degenerative disc disease)   . Hypertension   . Sleep apnea, obstructive   . Diabetic peripheral neuropathy   . Common migraine   . Diabetes mellitus   . GERD (gastroesophageal reflux disease)   . DJD (degenerative joint disease)   . Obesity   . Diverticulosis   . Diverticulitis   . Allergy      PAST SURGICAL HISTORY:  Past Surgical History  Procedure Laterality Date  . Ankle surgery      Left   . Shoulder surgery       MEDICATIONS:  Previous Medications   ACETAMINOPHEN (TYLENOL) 500 MG TABLET    Take 500 mg by mouth every 6  (six) hours as needed.     ALBUTEROL (PROVENTIL HFA;VENTOLIN HFA) 108 (90 BASE) MCG/ACT INHALER    Inhale 2 puffs into the lungs every 6 (six) hours as needed for wheezing.   CALCIUM CARB-CHOLECALCIFEROL (CALCIUM 1000 + D PO)    Take by mouth.     ERGOTAMINE-CAFFEINE (CAFERGOT) 1-100 MG PER TABLET    Take 1 tablet by mouth once. As needed    ESTRADIOL (ESTRACE) 0.1 MG/GM VAGINAL CREAM    Place 2 g vaginally daily.     FLAXSEED, LINSEED, OIL    Take by mouth. Twice daily    FLUTICASONE (VERAMYST) 27.5 MCG/SPRAY NASAL SPRAY    Place 2 sprays into the nose as needed.    GABAPENTIN (NEURONTIN) 300 MG CAPSULE    Take 300 mg by mouth at bedtime.   INSULIN ASPART PROT & ASPART (NOVOLOG MIX 70/30 FLEXPEN [place])    Inject into the skin. 65 units in the morning, 35 units at lunch, and 80 units at dinner or as directed   LISINOPRIL (PRINIVIL,ZESTRIL) 40 MG TABLET    Take 20 mg by mouth daily.    METFORMIN (GLUCOPHAGE XR) 500 MG 24 HR TABLET    Take 500 mg by mouth. 2 pills in the morning and  2 pills at night   METOCLOPRAMIDE (REGLAN) 10 MG TABLET    Take 10 mg by mouth. As needed    MULTIPLE VITAMIN (MULTIVITAMIN) TABLET    Take 1 tablet by mouth daily.     NAPROXEN SODIUM (ANAPROX) 220 MG TABLET    Take 220 mg by mouth. Couple of time a week as needed    OMEPRAZOLE (PRILOSEC) 40 MG CAPSULE    Take 1 capsule (40 mg total) by mouth daily.   PAROXETINE (PAXIL) 20 MG TABLET    Take 10 mg by mouth every morning.    VITAMIN A PO    Take 1 capsule by mouth 2 (two) times daily.   VITAMIN C (ASCORBIC ACID) 500 MG TABLET    Take 500 mg by mouth 2 (two) times daily.   VITAMIN E PO    Take 1 capsule by mouth 2 (two) times daily.   ZALEPLON (SONATA) 10 MG CAPSULE    Take 10 mg by mouth at bedtime as needed.     ALLERGIES:  Allergies  Allergen Reactions  . Latex Hives  . Phenergan [Promethazine]   . Wellbutrin [Bupropion]   . Zocor [Simvastatin]      FAMILY HISTORY:  Family History  Problem Relation Age of  Onset  . Breast cancer Mother   . Bone cancer Mother   . Heart failure Father   . Colon cancer Neg Hx      SOCIAL HISTORY: History  Substance Use Topics  . Smoking status: Never Smoker   . Smokeless tobacco: Never Used  . Alcohol Use: No     REVIEW OF SYSTEMS:   A ten system review of systems was negative except as noted in the HPI and PMH.  ED Triage Vitals  Enc Vitals Group     BP 12/07/13 0937 120/80 mmHg     Pulse Rate 12/07/13 0937 79     Resp 12/07/13 0937 16     Temp 12/07/13 0937 98.1 F (36.7 C)     Temp src 12/07/13 0937 Oral     SpO2 12/07/13 0937 95 %     Weight 12/07/13 0937 212 lb (96.163 kg)     Height 12/07/13 0937 4' 6.5" (1.384 m)     Head Cir --      Peak Flow --      Pain Score --      Pain Loc --      Pain Edu? --      Excl. in [place]? --   HEENT exam. TMs are clear nose is congested posterior pharynx was clear. Neck was supple without adenopathy. Chest exam reveals increased AP diameter with and expiratory wheezes noted in both lung fields. Her cardiac exam was unremarkable with a regular rate and rhythm. Results for orders placed in visit on 12/07/13  POCT CBC      Result Value Ref Range   WBC 5.4  4.6 - 10.2 K/uL   Lymph, poc 1.4  0.6 - 3.4   POC LYMPH PERCENT 26.2  10 - 50 %L   MID (cbc) 0.5  0 - 0.9   POC MID % 9.3  0 - 12 %M   POC Granulocyte 3.5  2 - 6.9   Granulocyte percent 64.5  37 - 80 %G   RBC 4.45  4.04 - 5.48 M/uL   Hemoglobin 12.2  12.2 - 16.2 g/dL   HCT, POC 39.5  37.7 - 47.9 %   MCV 88.8  80 - 97  fL   MCH, POC 27.4  27 - 31.2 pg   MCHC 30.9 (*) 31.8 - 35.4 g/dL   RDW, POC 14.7     Platelet Count, POC 134 (*) 142 - 424 K/uL   MPV 8.7  0 - 99.8 fL   IMPRESSION: Diagnoses that have been ruled out:  None  Diagnoses that are still under consideration:  None  Final diagnoses:  None    MEDICATIONS GIVEN IN THE E.D. Medications - No data to display  DISCHARGE MEDICATIONS: New Prescriptions   No medications on file     Plan    Patient will be on Dulera  2 puffs twice a day. She will continue albuterol HFA 2 puffs every 4-6 hours as needed. She was prescribed Zithromax because of her purulent sputum. She will check with Dr.Aava  on Monday regarding a change from her ace inhibitor to an ARB.

## 2013-12-13 ENCOUNTER — Telehealth: Payer: Self-pay

## 2013-12-13 NOTE — Telephone Encounter (Signed)
Patient called stated she seen Dr. Everlene Farrier last Saturday. She want to know if their she can take besides mucinex and her inhaler. Patient stated she is not able to sleep at night and she can not stop coughing. She finished her antibiotic. Best contact [phone]

## 2013-12-14 NOTE — Telephone Encounter (Signed)
RTC

## 2013-12-14 NOTE — Telephone Encounter (Signed)
Spoke with pt. She is feeling much better, but the cough is still keeping her up at night and she is unable to sleep. Unable to get in this weekend due to taking care of her husband post op. Wants to know if we can call in any cough syrup for her please. Thanks

## 2013-12-14 NOTE — Telephone Encounter (Signed)
Please print out a prescription for Hycodan 1 teaspoon at night for cough dispense 120 cc. Call patient that she can come by tomorrow to pick up her prescription

## 2013-12-15 MED ORDER — HYDROCODONE-HOMATROPINE 5-1.5 MG/5ML PO SYRP: 5.0000 mL | ORAL_SOLUTION | Freq: Every day | ORAL | Status: DC

## 2013-12-15 NOTE — Telephone Encounter (Signed)
LMOM that this was up front for her to p/u

## 2013-12-30 ENCOUNTER — Ambulatory Visit (INDEPENDENT_AMBULATORY_CARE_PROVIDER_SITE_OTHER): Payer: Managed Care, Other (non HMO) | Admitting: Pulmonary Disease

## 2013-12-30 ENCOUNTER — Encounter: Payer: Self-pay | Admitting: Pulmonary Disease

## 2013-12-30 VITALS — BP 128/80 | HR 75 | Temp 97.3°F | Ht 64.5 in | Wt 211.6 lb

## 2013-12-30 DIAGNOSIS — R059 Cough, unspecified: Secondary | ICD-10-CM

## 2013-12-30 DIAGNOSIS — R053 Chronic cough: Secondary | ICD-10-CM

## 2013-12-30 DIAGNOSIS — R05 Cough: Secondary | ICD-10-CM

## 2013-12-30 NOTE — Assessment & Plan Note (Signed)
The patient has a chronic cough but I suspect is primarily upper airway in origin. She has ongoing postnasal drip and rhinorrhea, reflux symptoms, and is also on an ACE inhibitor. Her lungs are totally clear today, her chest x-ray from January was clear, and her spirometry is normal as well. I cannot 100% excluded the possibility of asthma, but I would find this very unlikely. I would like to minimize eructation to her upper airway, and we'll therefore intensify treatment for allergic rhinitis, continue her on a proton pump inhibitor, and make sure she stays off an ACE inhibitor. She is to check back with me in 2-3 weeks with an update on her progress.

## 2013-12-30 NOTE — Progress Notes (Signed)
   Subjective:    Patient ID: Janice [name], female    DOB: 1951-01-16, 63 y.o.   MRN: [id number]  HPI The patient is a 63 year old female who I have been asked to see for chronic cough and "bronchitis". She has had ongoing cough issues for years, but most recently has had a chronic dry cough since January of this year. She has been tried on multiple medications, and inhalers, as well as nebulizer treatments. She has heard significant wheezing that is audible in nature, and has had some increased shortness of breath. The patient has no history of asthma as a child or growing up, but does have a history of significant reflux for which she is on a proton pump inhibitor. She is currently having significant rhinorrhea and postnasal drip, but there has been no purulence or sinus pressure. She is sniffing constantly during our visit. The patient has had a recent chest x-ray that was unremarkable. She also states the last 4 days her symptoms have been much improved.   Review of Systems  Constitutional: Negative for fever and unexpected weight change.  HENT: Positive for congestion and postnasal drip. Negative for dental problem, ear pain, nosebleeds, rhinorrhea, sinus pressure, sneezing, sore throat and trouble swallowing.   Eyes: Negative for redness and itching.  Respiratory: Positive for cough, shortness of breath and wheezing. Negative for chest tightness.   Cardiovascular: Negative for palpitations and leg swelling.  Gastrointestinal: Negative for nausea and vomiting.  Genitourinary: Negative for dysuria.  Musculoskeletal: Negative for joint swelling.  Skin: Positive for rash.  Neurological: Negative for headaches.  Hematological: Does not bruise/bleed easily.  Psychiatric/Behavioral: Negative for dysphoric mood. The patient is not nervous/anxious.        Objective:   Physical Exam Constitutional:  Obese female, no acute distress  HENT:  Nares patent without discharge, erythematous  mucosa   Oropharynx without exudate, palate and uvula are normal  Eyes:  Perrla, eomi, no scleral icterus  Neck:  No JVD, no TMG  Cardiovascular:  Normal rate, regular rhythm, no rubs or gallops.  No murmurs        Intact distal pulses  Pulmonary :  Normal breath sounds, no stridor or respiratory distress   No rales, rhonchi, or wheezing  Abdominal:  Soft, nondistended, bowel sounds present.  No tenderness noted.   Musculoskeletal:  minimal lower extremity edema noted.  Lymph Nodes:  No cervical lymphadenopathy noted  Skin:  No cyanosis noted  Neurologic:  Alert, appropriate, moves all 4 extremities without obvious deficit.         Assessment & Plan:

## 2013-12-30 NOTE — Patient Instructions (Signed)
Stop dulera and albuterol Do not take your lisinopril, and make sure your new prescription from your primary is NOT an ACE inhibitor. Stay on your reflux medication twice a day.  Take zyrtec 10mg  otc at bedtime each night Trial of dymista 2 sprays each nostril each am only. Please call me in 2-3 weeks with update on how things are going.

## 2014-02-14 ENCOUNTER — Telehealth: Payer: Self-pay | Admitting: Pulmonary Disease

## 2014-02-14 NOTE — Telephone Encounter (Signed)
Called and spoke with pt and she stated that she ordered the dymista for a 3 month supply and didn't realize that it was a higher tier med.  Pt is requesting to have something in a generic form for the next refill in 3 months.  [place] please advise if anything generic can replace the dymista.    Pt also stated that she was to call back to give an update to [facility] on how she is doing.  Pt stated that she is doing much better.  The dymista has helped and she still has a cough but it is much better.    Allergies  Allergen Reactions  . Doxycycline     Itching  . Latex Hives  . Levofloxacin     Joint aches//leg, shoulder pain  . Phenergan [Promethazine]   . Wellbutrin [Bupropion]   . Zocor [Simvastatin]     Current Outpatient Prescriptions on File Prior to Visit  Medication Sig Dispense Refill  . acetaminophen (TYLENOL) 500 MG tablet Take 500 mg by mouth every 6 (six) hours as needed.        Marland Kitchen albuterol (PROVENTIL HFA;VENTOLIN HFA) 108 (90 BASE) MCG/ACT inhaler Inhale 2 puffs into the lungs every 6 (six) hours as needed for wheezing.  1 Inhaler  0  . Calcium Carb-Cholecalciferol (CALCIUM 1000 + D PO) Take by mouth.        . diphenhydrAMINE (BENADRYL) 25 MG tablet Take 25 mg by mouth at bedtime as needed.      . ergotamine-caffeine (CAFERGOT) 1-100 MG per tablet Take 1 tablet by mouth once. As needed       . estradiol (ESTRACE) 0.1 MG/GM vaginal cream Place 2 g vaginally daily.        . Flaxseed, Linseed, OIL Take by mouth. Twice daily       . fluticasone (VERAMYST) 27.5 MCG/SPRAY nasal spray Place 2 sprays into the nose as needed.       . gabapentin (NEURONTIN) 300 MG capsule Take 300 mg by mouth at bedtime.      Marland Kitchen HYDROcodone-homatropine (HYCODAN) 5-1.5 MG/5ML syrup Take 5 mLs by mouth at bedtime.  120 mL  0  . Insulin Aspart Prot & Aspart (NOVOLOG MIX 70/30 FLEXPEN [place]) Inject into the skin. 80 Units twice daily      . lisinopril (PRINIVIL,ZESTRIL) 40 MG tablet Take 20 mg by mouth daily.       .  metFORMIN (GLUCOPHAGE XR) 500 MG 24 hr tablet Take 1,000 mg by mouth 2 (two) times daily.       . mometasone-formoterol (DULERA) 200-5 MCG/ACT AERO Inhale 2 puffs into the lungs 2 (two) times daily.  1 Inhaler  11  . Multiple Vitamin (MULTIVITAMIN) tablet Take 1 tablet by mouth daily.        . Multiple Vitamins-Minerals (HAIR/SKIN/NAILS PO) Take 1 tablet by mouth 2 (two) times daily.      . naproxen sodium (ANAPROX) 220 MG tablet Take 220 mg by mouth. Couple of time a week as needed       . nystatin-triamcinolone (MYCOLOG II) cream Apply 1 application topically as needed.      Marland Kitchen omeprazole (PRILOSEC) 40 MG capsule Take 1 capsule (40 mg total) by mouth daily.  90 capsule  6  . PARoxetine (PAXIL) 20 MG tablet Take 10 mg by mouth every morning.       Marland Kitchen terconazole (TERAZOL 7) 0.4 % vaginal cream Place 1 applicator vaginally as needed.      Marland Kitchen VITAMIN  A PO Take 1 capsule by mouth 2 (two) times daily.      . vitamin C (ASCORBIC ACID) 500 MG tablet Take 500 mg by mouth 2 (two) times daily.      Marland Kitchen VITAMIN E PO Take 1 capsule by mouth 2 (two) times daily.      . zaleplon (SONATA) 10 MG capsule Take 10 mg by mouth at bedtime as needed.        Current Facility-Administered Medications on File Prior to Visit  Medication Dose Route Frequency Provider Last Rate Last Dose  . albuterol (PROVENTIL) (2.5 MG/3ML) 0.083% nebulizer solution 2.5 mg  2.5 mg Nebulization Once Posey Boyer, MD

## 2014-02-14 NOTE — Telephone Encounter (Signed)
Let her know there is no generic for dymista specifically.  Can use flonase and astelin, but that would be 2  Copays. Would recommend staying on dymista since you already have 3 mos, then followup with me in 45mos and we can discuss options.

## 2014-02-14 NOTE — Telephone Encounter (Signed)
Spoke with pt-- Aware of recs per [place] Pt to stay on [place] for now. Pt aware to follow up in 3 mo--will call back for appt.  Nothing further needed.

## 2014-05-07 ENCOUNTER — Encounter: Payer: Self-pay | Admitting: Internal Medicine

## 2014-07-08 ENCOUNTER — Telehealth: Payer: Self-pay | Admitting: Pulmonary Disease

## 2014-07-08 NOTE — Telephone Encounter (Signed)
Error.Stanley A Dalton ° °

## 2014-11-06 ENCOUNTER — Encounter (INDEPENDENT_AMBULATORY_CARE_PROVIDER_SITE_OTHER): Payer: Managed Care, Other (non HMO) | Admitting: Ophthalmology

## 2014-11-06 DIAGNOSIS — H2513 Age-related nuclear cataract, bilateral: Secondary | ICD-10-CM

## 2014-11-06 DIAGNOSIS — I10 Essential (primary) hypertension: Secondary | ICD-10-CM

## 2014-11-06 DIAGNOSIS — H43813 Vitreous degeneration, bilateral: Secondary | ICD-10-CM

## 2014-11-06 DIAGNOSIS — H35033 Hypertensive retinopathy, bilateral: Secondary | ICD-10-CM

## 2015-01-07 ENCOUNTER — Encounter: Payer: Self-pay | Admitting: Internal Medicine

## 2015-01-26 ENCOUNTER — Telehealth: Payer: Self-pay | Admitting: Internal Medicine

## 2015-01-26 NOTE — Telephone Encounter (Signed)
Dr. Ardis Hughs will you accept this pt? Please see notes below.

## 2015-01-26 NOTE — Telephone Encounter (Signed)
Happy to.  If she needs office appt, please offer my next available rov.  Thanks

## 2015-01-26 NOTE — Telephone Encounter (Signed)
Spoke with pt and scheduled her for next available with Dr. Ardis Hughs, pt aware of appt.

## 2015-01-26 NOTE — Telephone Encounter (Signed)
Per pt she would like to switch care from Dr. Henrene Pastor to Dr. Ardis Hughs. She states she had a bad experience with Dr. Henrene Pastor, and ended up seeing Dr. Ardis Hughs at the hospital. She says Dr. Ardis Hughs was the last doctor she saw, and would like to continue seeing him.

## 2015-01-26 NOTE — Telephone Encounter (Signed)
Dr. Henrene Pastor this pt wants to switch care to Dr. Ardis Hughs, will you approve the switch? Please advise.

## 2015-01-26 NOTE — Telephone Encounter (Signed)
Post polypectomy bleed in 2012 from large polyp. Okay to switch providers from my standpoint.

## 2015-02-02 ENCOUNTER — Ambulatory Visit: Payer: Managed Care, Other (non HMO) | Admitting: Internal Medicine

## 2015-02-19 ENCOUNTER — Other Ambulatory Visit: Payer: Self-pay | Admitting: Obstetrics and Gynecology

## 2015-02-20 LAB — CYTOLOGY - PAP

## 2015-02-24 ENCOUNTER — Other Ambulatory Visit: Payer: Self-pay | Admitting: Obstetrics and Gynecology

## 2015-02-24 DIAGNOSIS — R928 Other abnormal and inconclusive findings on diagnostic imaging of breast: Secondary | ICD-10-CM

## 2015-02-26 ENCOUNTER — Other Ambulatory Visit: Payer: Self-pay | Admitting: Obstetrics and Gynecology

## 2015-02-26 ENCOUNTER — Ambulatory Visit
Admission: RE | Admit: 2015-02-26 | Discharge: 2015-02-26 | Disposition: A | Discharge status: Home / Self Care | Payer: PRIVATE HEALTH INSURANCE | Source: Ambulatory Visit | Attending: Obstetrics and Gynecology | Admitting: Obstetrics and Gynecology

## 2015-02-26 DIAGNOSIS — R928 Other abnormal and inconclusive findings on diagnostic imaging of breast: Secondary | ICD-10-CM

## 2015-04-06 ENCOUNTER — Ambulatory Visit: Payer: Managed Care, Other (non HMO) | Admitting: Gastroenterology

## 2015-04-28 ENCOUNTER — Ambulatory Visit: Payer: Managed Care, Other (non HMO) | Admitting: Gastroenterology

## 2015-11-29 ENCOUNTER — Ambulatory Visit (INDEPENDENT_AMBULATORY_CARE_PROVIDER_SITE_OTHER): Payer: Managed Care, Other (non HMO) | Admitting: Physician Assistant

## 2015-11-29 VITALS — BP 142/70 | HR 72 | Temp 97.8°F | Resp 14 | Ht 64.0 in | Wt 221.0 lb

## 2015-11-29 DIAGNOSIS — H18892 Other specified disorders of cornea, left eye: Secondary | ICD-10-CM | POA: Diagnosis not present

## 2015-11-29 MED ORDER — POLYMYXIN B-TRIMETHOPRIM 10000-0.1 UNIT/ML-% OP SOLN: 1.0000 [drp] | OPHTHALMIC | Status: DC

## 2015-11-29 NOTE — Progress Notes (Signed)
11/29/2015 1:04 PM   DOB: 16-Nov-1950 / MRN: [id number]  SUBJECTIVE:  Janice [name] is a 65 y.o. female presenting for right eye irritation since that started 3 days ago.  Reports that something is in the eye. She has a history of diabetes and hypertension.   She is allergic to doxycycline; latex; levofloxacin; phenergan; wellbutrin; and zocor.   She  has a past medical history of Depression; History of colon polyps; DDD (degenerative disc disease); Hypertension; Sleep apnea, obstructive; Diabetic peripheral neuropathy ([place]); Common migraine; Diabetes mellitus; GERD (gastroesophageal reflux disease); DJD (degenerative joint disease); Obesity; Diverticulosis; Diverticulitis; and Allergy.    She  reports that she has never smoked. She has never used smokeless tobacco. She reports that she does not drink alcohol or use illicit drugs. She  reports that she does not engage in sexual activity. The patient  has past surgical history that includes Ankle surgery and Shoulder surgery.  Her family history includes Bone cancer in her mother; Breast cancer in her mother; Heart failure in her father. There is no history of Colon cancer.  Review of Systems  Constitutional: Negative for fever.  Eyes: Negative for photophobia.  Gastrointestinal: Negative for nausea.  Skin: Negative for rash.  Neurological: Negative for dizziness and headaches.    Problem list and medications reviewed and updated by myself where necessary, and exist elsewhere in the encounter.   OBJECTIVE:  BP 142/70 mmHg  Pulse 72  Temp(Src) 97.8 F (36.6 C) (Oral)  Resp 14  Ht 5\' 4"  (1.626 m)  Wt 221 lb (100.245 kg)  BMI 37.92 kg/m2  SpO2 98%  Physical Exam  Constitutional: She is oriented to person, place, and time. She appears well-nourished. No distress.  HENT:  Head: Normocephalic.  Right Ear: External ear normal.  Left Ear: External ear normal.  Eyes: EOM are normal. Pupils are equal, round, and reactive to light.  Right conjunctiva is not injected. Right conjunctiva has no hemorrhage. Left conjunctiva is not injected. Left conjunctiva has no hemorrhage.  Fundoscopic exam:      The right eye shows no AV nicking and no hemorrhage.       The left eye shows no AV nicking and no hemorrhage.  Patient with 50% reduction in her symptoms with application of proparacaine.  No abrasion could be identified with staining.  Eye washed with copious amounts of saline.    Cardiovascular: Normal rate.   Pulmonary/Chest: Effort normal.  Neurological: She is alert and oriented to person, place, and time. No cranial nerve deficit. Coordination normal.  Skin: Skin is warm and dry.  Psychiatric: She has a normal mood and affect.  Vitals reviewed.    No results found for this or any previous visit (from the past 72 hour(s)).   Visual Acuity Screening   Right eye Left eye Both eyes  Without correction:     With correction: 20/25 20/20 20/15      ASSESSMENT AND PLAN  Jermecia was seen today for eye problem.  Diagnoses and all orders for this visit:  Corneal irritation of left eye: Her response to proparacaine is consistent with a corneal abrasion, although I can not identify the lesion. She felt much better after leaving the clinic.  Will cover for a bacterial etiology with drops.  Given I could not identify the lesion I offered a referral to Dr. Katy Fitch first thing in the morning, however she declined this and wants to see her own eye doctor.  Advised she go first thing tomorrow.    -  trimethoprim-polymyxin b (POLYTRIM) ophthalmic solution; Place 1 drop into the right eye every 4 (four) hours.    The patient was advised to call or return to clinic if she does not see an improvement in symptoms or to seek the care of the closest emergency department if she worsens with the above plan.   Philis Fendt, MHS, PA-C [facility] 11/29/2015 1:04 PM

## 2015-11-29 NOTE — Patient Instructions (Signed)
     IF you received an x-ray today, you will receive an invoice from [facility]. Please contact [facility] at [phone] with questions or concerns regarding your invoice.   IF you received labwork today, you will receive an invoice from [facility]. Please contact [facility] at [phone] with questions or concerns regarding your invoice.   Our billing staff will not be able to assist you with questions regarding bills from these companies.  You will be contacted with the lab results as soon as they are available. The fastest way to get your results is to activate your My Chart account. Instructions are located on the last page of this paperwork. If you have not heard from us regarding the results in 2 weeks, please contact this office.      

## 2016-03-22 ENCOUNTER — Encounter: Payer: Self-pay | Admitting: Gastroenterology

## 2016-06-07 ENCOUNTER — Encounter: Payer: Self-pay | Admitting: Gastroenterology

## 2016-07-19 ENCOUNTER — Ambulatory Visit (AMBULATORY_SURGERY_CENTER): Payer: Self-pay

## 2016-07-19 VITALS — Ht 64.5 in | Wt 222.0 lb

## 2016-07-19 DIAGNOSIS — Z86010 Personal history of colonic polyps: Secondary | ICD-10-CM

## 2016-07-19 MED ORDER — NA SULFATE-K SULFATE-MG SULF 17.5-3.13-1.6 GM/177ML PO SOLN: ORAL | 0 refills | Status: DC

## 2016-07-19 NOTE — Progress Notes (Signed)
Per pt, no allergies to soy or egg products.Pt not taking any weight loss meds or using  O2 at home. 

## 2016-08-01 ENCOUNTER — Ambulatory Visit (AMBULATORY_SURGERY_CENTER): Payer: Medicare Other | Admitting: Gastroenterology

## 2016-08-01 ENCOUNTER — Encounter: Payer: Self-pay | Admitting: Gastroenterology

## 2016-08-01 VITALS — BP 113/50 | HR 64 | Temp 97.0°F | Resp 14 | Ht 64.5 in | Wt 222.0 lb

## 2016-08-01 DIAGNOSIS — D128 Benign neoplasm of rectum: Secondary | ICD-10-CM

## 2016-08-01 DIAGNOSIS — D123 Benign neoplasm of transverse colon: Secondary | ICD-10-CM

## 2016-08-01 DIAGNOSIS — D122 Benign neoplasm of ascending colon: Secondary | ICD-10-CM

## 2016-08-01 DIAGNOSIS — Z86010 Personal history of colonic polyps: Secondary | ICD-10-CM

## 2016-08-01 DIAGNOSIS — K5730 Diverticulosis of large intestine without perforation or abscess without bleeding: Secondary | ICD-10-CM

## 2016-08-01 DIAGNOSIS — D129 Benign neoplasm of anus and anal canal: Secondary | ICD-10-CM

## 2016-08-01 MED ORDER — SODIUM CHLORIDE 0.9 % IV SOLN: 500.0000 mL | INTRAVENOUS | Status: DC

## 2016-08-01 NOTE — Progress Notes (Signed)
Report given to PACU RN, vss 

## 2016-08-01 NOTE — Op Note (Signed)
[facility] Patient Name: Janice [name] Procedure Date: 08/01/2016 7:46 AM MRN: [id number] Endoscopist: Milus Banister , MD Age: 65 Referring MD:  Date of Birth: [birth year] Gender: Female Account #: [id number] Procedure:                Colonoscopy Indications:              High risk colon cancer surveillance: Personal                            history of colonic polyps (Dr. Henrene Pastor colonoscopy                            2007, 2012 2cm pedunculated polyp in sigmoid, c/b                            post polypectomy bleeding) Medicines:                Monitored Anesthesia Care Procedure:                Pre-Anesthesia Assessment:                           - Prior to the procedure, a History and Physical                            was performed, and patient medications and                            allergies were reviewed. The patient's tolerance of                            previous anesthesia was also reviewed. The risks                            and benefits of the procedure and the sedation                            options and risks were discussed with the patient.                            All questions were answered, and informed consent                            was obtained. Prior Anticoagulants: The patient has                            taken no previous anticoagulant or antiplatelet                            agents. ASA Grade Assessment: II - A patient with                            mild systemic disease. After reviewing the risks  and benefits, the patient was deemed in                            satisfactory condition to undergo the procedure.                           After obtaining informed consent, the colonoscope                            was passed under direct vision. Throughout the                            procedure, the patient's blood pressure, pulse, and                            oxygen saturations were monitored  continuously. The                            Model CF-HQ190L [id number]) scope was introduced                            through the anus and advanced to the the cecum,                            identified by appendiceal orifice and ileocecal                            valve. The colonoscopy was performed without                            difficulty. The patient tolerated the procedure                            well. The quality of the bowel preparation was                            good. The ileocecal valve, appendiceal orifice, and                            rectum were photographed. Scope In: 7:50:17 AM Scope Out: 8:01:22 AM Scope Withdrawal Time: 0 hours 8 minutes 41 seconds  Total Procedure Duration: 0 hours 11 minutes 5 seconds  Findings:                 Three sessile polyps were found in the rectum,                            hepatic flexure and ascending colon. The polyps                            were 4 to 6 mm in size. These polyps were removed                            with a cold snare. Resection and retrieval  were                            complete.                           Many small and large-mouthed diverticula were found                            in the entire colon.                           The exam was otherwise without abnormality on                            direct and retroflexion views. Complications:            No immediate complications. Estimated blood loss:                            None. Estimated Blood Loss:     Estimated blood loss: none                           . Impression:               - Three 4 to 6 mm polyps in the rectum, at the                            hepatic flexure and in the ascending colon, removed                            with a cold snare. Resected and retrieved.                           - Diverticulosis in the entire examined colon.                           - The examination was otherwise normal on direct                             and retroflexion views. Recommendation:           - Patient has a contact number available for                            emergencies. The signs and symptoms of potential                            delayed complications were discussed with the                            patient. Return to normal activities tomorrow.                            Written discharge instructions were provided to the  patient.                           - Resume previous diet.                           - Continue present medications.                           You will receive a letter within 2-3 weeks with the                            pathology results and my final recommendations.                           If the polyp(s) is proven to be 'pre-cancerous' on                            pathology, you will need repeat colonoscopy in 3                            years. Milus Banister, MD 08/01/2016 8:04:28 AM This report has been signed electronically.

## 2016-08-01 NOTE — Progress Notes (Signed)
Called to room to assist during endoscopic procedure.  Patient ID and intended procedure confirmed with present staff. Received instructions for my participation in the procedure from the performing physician.  

## 2016-08-01 NOTE — Patient Instructions (Signed)
Discharge instructions given. Handouts on polyps and diverticulosis. Resume previous medications. YOU HAD AN ENDOSCOPIC PROCEDURE TODAY AT THE [facility]:   Refer to the procedure report that was given to you for any specific questions about what was found during the examination.  If the procedure report does not answer your questions, please call your gastroenterologist to clarify.  If you requested that your care partner not be given the details of your procedure findings, then the procedure report has been included in a sealed envelope for you to review at your convenience later.  YOU SHOULD EXPECT: Some feelings of bloating in the abdomen. Passage of more gas than usual.  Walking can help get rid of the air that was put into your GI tract during the procedure and reduce the bloating. If you had a lower endoscopy (such as a colonoscopy or flexible sigmoidoscopy) you may notice spotting of blood in your stool or on the toilet paper. If you underwent a bowel prep for your procedure, you may not have a normal bowel movement for a few days.  Please Note:  You might notice some irritation and congestion in your nose or some drainage.  This is from the oxygen used during your procedure.  There is no need for concern and it should clear up in a day or so.  SYMPTOMS TO REPORT IMMEDIATELY:   Following lower endoscopy (colonoscopy or flexible sigmoidoscopy):  Excessive amounts of blood in the stool  Significant tenderness or worsening of abdominal pains  Swelling of the abdomen that is new, acute  Fever of 100F or higher   For urgent or emergent issues, a gastroenterologist can be reached at any hour by calling [phone].   DIET:  We do recommend a small meal at first, but then you may proceed to your regular diet.  Drink plenty of fluids but you should avoid alcoholic beverages for 24 hours.  ACTIVITY:  You should plan to take it easy for the rest of today and you should NOT  DRIVE or use heavy machinery until tomorrow (because of the sedation medicines used during the test).    FOLLOW UP: Our staff will call the number listed on your records the next business day following your procedure to check on you and address any questions or concerns that you may have regarding the information given to you following your procedure. If we do not reach you, we will leave a message.  However, if you are feeling well and you are not experiencing any problems, there is no need to return our call.  We will assume that you have returned to your regular daily activities without incident.  If any biopsies were taken you will be contacted by phone or by letter within the next 1-3 weeks.  Please call us at [phone] if you have not heard about the biopsies in 3 weeks.    SIGNATURES/CONFIDENTIALITY: You and/or your care partner have signed paperwork which will be entered into your electronic medical record.  These signatures attest to the fact that that the information above on your After Visit Summary has been reviewed and is understood.  Full responsibility of the confidentiality of this discharge information lies with you and/or your care-partner. 

## 2016-08-02 ENCOUNTER — Telehealth: Payer: Self-pay

## 2016-08-02 NOTE — Telephone Encounter (Signed)
  Follow up Call-  Call back number 08/01/2016  Post procedure Call Back phone  # [phone]  Permission to leave phone message Yes  Some recent data might be hidden    Patient was called for follow up after her procedure on 08/01/2016. No answer at the number given for follow up phone call. A message was left on the answering machine.

## 2016-08-02 NOTE — Telephone Encounter (Signed)
Left a message at [phone] for the pt to call us back if any questions or concerns. maw

## 2016-08-08 ENCOUNTER — Encounter: Payer: Self-pay | Admitting: Gastroenterology

## 2016-08-22 ENCOUNTER — Encounter (HOSPITAL_COMMUNITY): Payer: Self-pay | Admitting: *Deleted

## 2016-08-22 ENCOUNTER — Ambulatory Visit (HOSPITAL_COMMUNITY)
Admission: EM | Admit: 2016-08-22 | Discharge: 2016-08-22 | Disposition: A | Discharge status: Home / Self Care | Payer: Medicare Other | Attending: Family Medicine | Admitting: Family Medicine

## 2016-08-22 DIAGNOSIS — H578 Other specified disorders of eye and adnexa: Secondary | ICD-10-CM

## 2016-08-22 DIAGNOSIS — H5789 Other specified disorders of eye and adnexa: Secondary | ICD-10-CM

## 2016-08-22 MED ORDER — TOBRAMYCIN 0.3 % OP OINT: 1.0000 "application " | TOPICAL_OINTMENT | Freq: Two times a day (BID) | OPHTHALMIC | 0 refills | Status: DC

## 2016-08-22 NOTE — ED Notes (Signed)
20/30  l     20/30  r      20/25  In  Both

## 2016-08-22 NOTE — Discharge Instructions (Signed)
The cornea became slightly irritated when it was scratched. Use the ointment twice a day for the next 2 days. Please return if symptoms persist

## 2016-08-22 NOTE — ED Triage Notes (Signed)
Pt  States        Developed     Some  Irritation  Of  The    Left    Eye  Last  Night       Pt  noticed  Pain  And   Photosensitivity      Started  Today      No  Known  Injury     Pt    States   She  Used    [employer]

## 2016-08-22 NOTE — ED Provider Notes (Signed)
[facility]    CSN: [id number] Arrival date & time: 08/22/16  1236     History   Chief Complaint Chief Complaint  Patient presents with  . Eye Problem    HPI Janice [name] is a 65 y.o. female.   This 65 year old woman who comes in complaining of 4 by sensation in her left eye for 24 hours. She apparently woke up with a history morning and his stride significant drops. She says the pain is, little bit worse over the last 24 hours and she does have photophobia. She does not recall scratching her eye or getting anything in it.      Past Medical History:  Diagnosis Date  . Allergy   . Common migraine   . Complication of anesthesia    Per pt/had breathing problems with "block" during rotator cuff surgery.  . DDD (degenerative disc disease)   . Depression   . Diabetes mellitus   . Diabetic peripheral neuropathy ([place])   . Diverticulitis   . Diverticulosis   . DJD (degenerative joint disease)   . GERD (gastroesophageal reflux disease)   . History of colon polyps    hyperplastic  . Hypertension   . Obesity   . Sleep apnea   . Sleep apnea, obstructive     Patient Active Problem List   Diagnosis Date Noted  . Chronic cough 12/30/2013  . Asthma with acute exacerbation 12/07/2013  . Diverticulitis 04/23/2012  . HTN (hypertension) 04/23/2012  . Diabetes mellitus ([place]) 04/23/2012  . Colon polyps 04/23/2012    Past Surgical History:  Procedure Laterality Date  . ANKLE SURGERY     Left   . SHOULDER SURGERY     left rotator cuff in 2015/right rotator cuff in 2012    OB History    No data available       Home Medications    Prior to Admission medications   Medication Sig Start Date End Date Taking? Authorizing Provider  acetaminophen (TYLENOL) 500 MG tablet Take 500 mg by mouth every 6 (six) hours as needed.      Historical Provider, MD  amLODipine (NORVASC) 10 MG tablet Take 10 mg by mouth daily.    Historical Provider, MD    amLODipine-benazepril (LOTREL) 5-10 MG capsule Take 1 capsule by mouth daily.    Historical Provider, MD  Calcium Carb-Cholecalciferol (CALCIUM 1000 + D PO) Take by mouth.      Historical Provider, MD  cetirizine (KLS ALLER-TEC) 10 MG tablet Take 10 mg by mouth daily.    Historical Provider, MD  desoximetasone (TOPICORT) 0.25 % cream Apply 1 application topically. Use on rash 6 times daily    Historical Provider, MD  diphenhydrAMINE (BENADRYL) 25 MG tablet Take 25 mg by mouth at bedtime as needed.    Historical Provider, MD  ergotamine-caffeine (CAFERGOT) 1-100 MG per tablet Take 1 tablet by mouth once. As needed     Historical Provider, MD  estradiol (ESTRACE) 0.1 MG/GM vaginal cream Place 2 g vaginally daily.      Historical Provider, MD  Flaxseed, Linseed, OIL Take by mouth. Twice daily     Historical Provider, MD  fluticasone (VERAMYST) 27.5 MCG/SPRAY nasal spray Place 2 sprays into the nose daily.     Historical Provider, MD  gabapentin (NEURONTIN) 300 MG capsule Take 300 mg by mouth. Take one pill in am and 2 pills at hs    Historical Provider, MD  ibuprofen (ADVIL,MOTRIN) 200 MG tablet Take 200 mg by mouth  every 6 (six) hours as needed.    Historical Provider, MD  insulin NPH Human (HUMULIN N,NOVOLIN N) 100 UNIT/ML injection Inject into the skin. Use 85 units at breakfast, lunch and supper/30 units at bedtime    Historical Provider, MD  metFORMIN (GLUCOPHAGE XR) 500 MG 24 hr tablet Take 1,000 mg by mouth 2 (two) times daily.     Historical Provider, MD  nystatin-triamcinolone (MYCOLOG II) cream Apply 1 application topically as needed.    Historical Provider, MD  PARoxetine (PAXIL) 20 MG tablet Take 10 mg by mouth daily with supper.     Historical Provider, MD  Polyethyl Glycol-Propyl Glycol (SYSTANE OP) Apply to eye. Use 2 drops in each eye every 6 hours    Historical Provider, MD  terconazole (TERAZOL 7) 0.4 % vaginal cream Place 1 applicator vaginally as needed.    Historical Provider, MD   terconazole (TERAZOL 7) 0.4 % vaginal cream Place 1 applicator vaginally as needed.    Historical Provider, MD  tobramycin (TOBREX) 0.3 % ophthalmic ointment Place 1 application into the left eye 2 (two) times daily. 08/22/16   Robyn Haber, MD  trimethoprim-polymyxin b (POLYTRIM) ophthalmic solution Place 1 drop into the right eye every 4 (four) hours. 11/29/15   Tereasa Coop, PA-C  vitamin C (ASCORBIC ACID) 500 MG tablet Take 500 mg by mouth. Take one pill 3 times a week    Historical Provider, MD  zaleplon (SONATA) 10 MG capsule Take 10 mg by mouth at bedtime as needed. Reported on 11/29/2015    Historical Provider, MD    Family History Family History  Problem Relation Age of Onset  . Breast cancer Mother   . Bone cancer Mother   . Heart failure Father   . Colon cancer Neg Hx   . Rectal cancer Neg Hx   . Stomach cancer Neg Hx     Social History Social History  Substance Use Topics  . Smoking status: Never Smoker  . Smokeless tobacco: Never Used  . Alcohol use Yes     Comment: 3 glasses per year     Allergies   Iodine; Latex; Zocor [simvastatin]; Phenergan [promethazine]; Wellbutrin [bupropion]; Doxycycline; and Levofloxacin   Review of Systems Review of Systems  Constitutional: Negative.   Eyes: Positive for photophobia and pain.     Physical Exam Triage Vital Signs ED Triage Vitals  Enc Vitals Group     BP 08/22/16 1433 148/57     Pulse Rate 08/22/16 1433 78     Resp 08/22/16 1433 18     Temp 08/22/16 1433 98.5 F (36.9 C)     Temp Source 08/22/16 1433 Oral     SpO2 08/22/16 1433 98 %     Weight --      Height --      Head Circumference --      Peak Flow --      Pain Score 08/22/16 1429 4     Pain Loc --      Pain Edu? --      Excl. in [place]? --    No data found.   Updated Vital Signs BP 148/57 (BP Location: Right Arm)   Pulse 78   Temp 98.5 F (36.9 C) (Oral)   Resp 18   SpO2 98%    Physical Exam  Constitutional: She is oriented to  person, place, and time. She appears well-developed and well-nourished.  HENT:  Right Ear: External ear normal.  Left Ear: External ear normal.  Mouth/Throat: Oropharynx is clear and moist.  Eyes: Conjunctivae and EOM are normal.  Funduscopic exam is normal  Neck: Normal range of motion. Neck supple.  Cardiovascular: Normal rate.   Pulmonary/Chest: Effort normal.  Musculoskeletal: Normal range of motion.  Neurological: She is alert and oriented to person, place, and time.  Skin: Skin is warm and dry.  Nursing note and vitals reviewed.    UC Treatments / Results  Labs (all labs ordered are listed, but only abnormal results are displayed) Labs Reviewed - No data to display  EKG  EKG Interpretation None       Radiology No results found.  Procedures Procedures (including critical care time)  Medications Ordered in UC Medications - No data to display   Initial Impression / Assessment and Plan / UC Course  I have reviewed the triage vital signs and the nursing notes.  Pertinent labs & imaging results that were available during my care of the patient were reviewed by me and considered in my medical decision making (see chart for details).  Clinical Course   Eye was anesthetized with tetracaine getting patient will file complete relief. Lid was everted showing no foreign body. There is no injection of the conjunctiva.  Final Clinical Impressions(s) / UC Diagnoses   Final diagnoses:  Eye irritation    New Prescriptions New Prescriptions   TOBRAMYCIN (TOBREX) 0.3 % OPHTHALMIC OINTMENT    Place 1 application into the left eye 2 (two) times daily.     Robyn Haber, MD 08/22/16 1501

## 2016-09-12 DIAGNOSIS — J101 Influenza due to other identified influenza virus with other respiratory manifestations: Secondary | ICD-10-CM | POA: Diagnosis not present

## 2016-09-14 DIAGNOSIS — R69 Illness, unspecified: Secondary | ICD-10-CM | POA: Diagnosis not present

## 2016-09-27 DIAGNOSIS — I10 Essential (primary) hypertension: Secondary | ICD-10-CM | POA: Diagnosis not present

## 2016-09-27 DIAGNOSIS — R69 Illness, unspecified: Secondary | ICD-10-CM | POA: Diagnosis not present

## 2016-09-27 DIAGNOSIS — E119 Type 2 diabetes mellitus without complications: Secondary | ICD-10-CM | POA: Diagnosis not present

## 2016-09-27 DIAGNOSIS — Z6837 Body mass index (BMI) 37.0-37.9, adult: Secondary | ICD-10-CM | POA: Diagnosis not present

## 2016-09-28 DIAGNOSIS — R69 Illness, unspecified: Secondary | ICD-10-CM | POA: Diagnosis not present

## 2016-10-03 DIAGNOSIS — J209 Acute bronchitis, unspecified: Secondary | ICD-10-CM | POA: Diagnosis not present

## 2016-10-03 DIAGNOSIS — Z6837 Body mass index (BMI) 37.0-37.9, adult: Secondary | ICD-10-CM | POA: Diagnosis not present

## 2016-10-03 DIAGNOSIS — G4733 Obstructive sleep apnea (adult) (pediatric): Secondary | ICD-10-CM | POA: Diagnosis not present

## 2016-10-03 DIAGNOSIS — R5383 Other fatigue: Secondary | ICD-10-CM | POA: Diagnosis not present

## 2016-10-03 DIAGNOSIS — E119 Type 2 diabetes mellitus without complications: Secondary | ICD-10-CM | POA: Diagnosis not present

## 2016-10-10 DIAGNOSIS — E119 Type 2 diabetes mellitus without complications: Secondary | ICD-10-CM | POA: Diagnosis not present

## 2016-10-10 DIAGNOSIS — J209 Acute bronchitis, unspecified: Secondary | ICD-10-CM | POA: Diagnosis not present

## 2016-10-10 DIAGNOSIS — R591 Generalized enlarged lymph nodes: Secondary | ICD-10-CM | POA: Diagnosis not present

## 2016-10-10 DIAGNOSIS — I10 Essential (primary) hypertension: Secondary | ICD-10-CM | POA: Diagnosis not present

## 2016-10-10 DIAGNOSIS — J45909 Unspecified asthma, uncomplicated: Secondary | ICD-10-CM | POA: Diagnosis not present

## 2016-10-10 DIAGNOSIS — K219 Gastro-esophageal reflux disease without esophagitis: Secondary | ICD-10-CM | POA: Diagnosis not present

## 2016-10-10 DIAGNOSIS — J309 Allergic rhinitis, unspecified: Secondary | ICD-10-CM | POA: Diagnosis not present

## 2016-10-11 DIAGNOSIS — R69 Illness, unspecified: Secondary | ICD-10-CM | POA: Diagnosis not present

## 2016-10-30 DIAGNOSIS — R69 Illness, unspecified: Secondary | ICD-10-CM | POA: Diagnosis not present

## 2016-11-03 DIAGNOSIS — R69 Illness, unspecified: Secondary | ICD-10-CM | POA: Diagnosis not present

## 2016-12-06 ENCOUNTER — Encounter: Payer: Self-pay | Admitting: Neurology

## 2016-12-06 ENCOUNTER — Ambulatory Visit (INDEPENDENT_AMBULATORY_CARE_PROVIDER_SITE_OTHER): Payer: Medicare HMO | Admitting: Neurology

## 2016-12-06 VITALS — BP 177/68 | HR 74 | Ht 64.5 in | Wt 214.5 lb

## 2016-12-06 DIAGNOSIS — Z9989 Dependence on other enabling machines and devices: Secondary | ICD-10-CM

## 2016-12-06 DIAGNOSIS — I150 Renovascular hypertension: Secondary | ICD-10-CM

## 2016-12-06 DIAGNOSIS — E0842 Diabetes mellitus due to underlying condition with diabetic polyneuropathy: Secondary | ICD-10-CM

## 2016-12-06 DIAGNOSIS — E6609 Other obesity due to excess calories: Secondary | ICD-10-CM

## 2016-12-06 DIAGNOSIS — R69 Illness, unspecified: Secondary | ICD-10-CM | POA: Diagnosis not present

## 2016-12-06 DIAGNOSIS — F5104 Psychophysiologic insomnia: Secondary | ICD-10-CM

## 2016-12-06 DIAGNOSIS — IMO0001 Reserved for inherently not codable concepts without codable children: Secondary | ICD-10-CM

## 2016-12-06 DIAGNOSIS — G4733 Obstructive sleep apnea (adult) (pediatric): Secondary | ICD-10-CM

## 2016-12-06 NOTE — Patient Instructions (Signed)
Please remember to try to maintain good sleep hygiene, which means: Keep a regular sleep and wake schedule, try not to exercise or have a meal within 2 hours of your bedtime, try to keep your bedroom conducive for sleep, that is, cool and dark, without light distractors such as an illuminated alarm clock, and refrain from watching TV right before sleep or in the middle of the night and do not keep the TV or radio on during the night. Also, try not to use or play on electronic devices at bedtime, such as your cell phone, tablet PC or laptop. If you like to read at bedtime on an electronic device, try to dim the background light as much as possible. Do not eat in the middle of the night.   We will request a sleep study.    We will look for leg twitching and snoring or sleep apnea.   For chronic insomnia, you are best followed by a psychiatrist and/or sleep psychologist.     

## 2016-12-06 NOTE — Progress Notes (Signed)
SLEEP MEDICINE CLINIC   Provider:  Larey Seat, [place] D  Primary Care Physician:  Tivis Ringer, MD   Referring Provider: Prince Solian, MD   Chief Complaint  Patient presents with  . OSA w/ CPAP    She has been lost to follow up here.  She estimates 4-6 hours of CPAP use each night.  She has started having problems with her new mask.  Her sleep is easily interrupted if she misses her evening dose of gabapentin.    HPI:  Janice [name] is a 66 y.o. female , seen here as in a referral/ revisit  from Dr. Dagmar Hait to reestablish sleep medicine care.  Mrs. Ghosh was diagnosed with sleep apnea in the year 2006 and last followed with me in the years 2011 or 2012 . She still uses the same CPAP but has had trouble with condensation water accumulating at the interface. She still needs control of allergic rhinitis to allow a patent nasal airway. She did not use sleep aids. She has had Sonata prescribed as a when necessary medication but doesn't use it daily. Other medical problems are insomnia ( controlled on SSRI ) , degenerative joint disease, allergic rhinitis, hypertension, GERD controlled with proton pump inhibitors, fatty liver disease, hyperlipidemia mixed, peripheral neuropathy, insulin-dependent diabetes mellitus, and above-named OSA. She used to have nocturnal cluster headaches and morning headaches before CPAP use.   Chief complaint according to patient : "I need a new machine , mine doesn't give data"  Sleep habits are as follows: She is usually in bed by 10 PM, she shares a bedroom with her husband, she sleeps on one single pillow and usually on her right side. She often takes Benadryl at night and a B12 under the tongue supplement. She doesn't watch TV in the bedroom. Usually she can fall asleep promptly with her regimen she estimates 25 out of 30 nights she will sleep through until 12:30 and 3:30 AM when she has  bathroom breaks. She then goes back to bed until her alarm rings  at 6 AM. At that time she has usually had between 6 and 7 hours of sleep. She does ride said she does not have vivid dreams, palpitations, diaphoresis, nausea or dizziness and is usually somewhat rested at the time her alarm rings. She will have a strong coffee in the morning and describes sometimes waking with a congested nose. Her mouth is dry. No headaches in AM .   Sleep medical history and family sleep history: no family history of sleep apnea, she has a brother and sister , both snore. Her father was a diabetic and developed congestive heart failure, he died of a stroke at age 63,  her mother had breast cancer with bone metastasis at age 34. She had in 2014 a right shoulder surgery and in 2015 a left shoulder rotator cuff repair which have influenced her sleep position.   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 She also works part time at [employer]. The patient has never used tobacco products, she drinks iced tea, up to 2 cups of coffee in the morning, no sodas. She may drink a glass of wine twice a year. She has never been a night shift worker. No children.   Review of Systems: Out of a complete 14 system review, the patient complains of only the following symptoms, and all other reviewed systems  are negative. The patient endorsed snoring when not using CPAP, allergic rhinitis, insomnia, diabetic neuropathy, hypertension  Epworth score 3 , Fatigue severity score 17  , depression score 2/15    Social History   Social History  . Marital status: Married    Spouse name: N/A  . Number of children: 0  . Years of education: 3 years college   Occupational History  . Unemployed    Social History Main Topics  . Smoking status: Never Smoker  . Smokeless tobacco: Never Used  . Alcohol use Yes     Comment: 3 glasses per year  . Drug use: No  . Sexual activity: No   Other Topics  Concern  . Not on file   Social History Narrative   1 caffeine drink daily    Right-handed   Lives at home with husband.    Family History  Problem Relation Age of Onset  . Breast cancer Mother   . Bone cancer Mother   . Heart failure Father   . Colon cancer Neg Hx   . Rectal cancer Neg Hx   . Stomach cancer Neg Hx     Past Medical History:  Diagnosis Date  . Allergy   . Common migraine   . Complication of anesthesia    Per pt/had breathing problems with "block" during rotator cuff surgery.  . DDD (degenerative disc disease)   . Depression   . Diabetes mellitus   . Diabetic peripheral neuropathy ([place])   . Diverticulitis   . Diverticulosis   . DJD (degenerative joint disease)   . GERD (gastroesophageal reflux disease)   . History of colon polyps    hyperplastic  . Hypertension   . Obesity   . Sleep apnea   . Sleep apnea, obstructive     Past Surgical History:  Procedure Laterality Date  . ANKLE SURGERY     Left   . SHOULDER SURGERY     left rotator cuff in 2015/right rotator cuff in 2012    Current Outpatient Prescriptions  Medication Sig Dispense Refill  . acetaminophen (TYLENOL) 500 MG tablet Take 500 mg by mouth every 6 (six) hours as needed.      Marland Kitchen amLODipine (NORVASC) 10 MG tablet Take 10 mg by mouth daily.    . Calcium Carb-Cholecalciferol (CALCIUM 1000 + D PO) Take by mouth.      . cetirizine (KLS ALLER-TEC) 10 MG tablet Take 10 mg by mouth daily.    . Cyanocobalamin (RAPID B-12 ENERGY) 200 MCG/SPRAY LIQD Take by mouth daily.    Marland Kitchen desoximetasone (TOPICORT) 0.25 % cream Apply 1 application topically. Use on rash 6 times daily    . diphenhydrAMINE (BENADRYL) 25 MG tablet Take 25 mg by mouth at bedtime as needed.    . Flaxseed, Linseed, OIL Take by mouth. Twice daily     . fluticasone (VERAMYST) 27.5 MCG/SPRAY nasal spray Place 2 sprays into the nose daily.     Marland Kitchen gabapentin (NEURONTIN) 300 MG capsule Take 300 mg by mouth. Take one pill in am and 2 pills  at hs    . ibuprofen (ADVIL,MOTRIN) 200 MG tablet Take 200 mg by mouth every 6 (six) hours as needed.    . Insulin Glargine (TOUJEO SOLOSTAR [place]) Inject into the skin. As directed    . metFORMIN (GLUCOPHAGE XR) 500 MG 24 hr tablet Take 1,000 mg by mouth 2 (two) times daily.     Marland Kitchen nystatin-triamcinolone (MYCOLOG II) cream Apply 1 application topically as  needed.    Marland Kitchen PARoxetine (PAXIL) 20 MG tablet Take 10 mg by mouth daily with supper.     Vladimir Faster Glycol-Propyl Glycol (SYSTANE OP) Apply to eye. Use 2 drops in each eye every 6 hours    . terconazole (TERAZOL 7) 0.4 % vaginal cream Place 1 applicator vaginally as needed.    . trimethoprim-polymyxin b (POLYTRIM) ophthalmic solution Place 1 drop into the right eye every 4 (four) hours. 10 mL 0  . vitamin C (ASCORBIC ACID) 500 MG tablet Take 500 mg by mouth. Take one pill 3 times a week    . zaleplon (SONATA) 10 MG capsule Take 10 mg by mouth at bedtime as needed. Reported on 11/29/2015     Current Facility-Administered Medications  Medication Dose Route Frequency Provider Last Rate Last Dose  . 0.9 %  sodium chloride infusion  500 mL Intravenous Continuous Milus Banister, MD        Allergies as of 12/06/2016 - Review Complete 12/06/2016  Allergen Reaction Noted  . Iodine  07/19/2016  . Latex Hives 09/28/2013  . Tessalon perles [benzonatate] Anaphylaxis 12/06/2016  . Zocor [simvastatin]  01/12/2013  . Phenergan [promethazine]  01/12/2013  . Wellbutrin [bupropion]  01/12/2013  . Doxycycline  12/30/2013  . Levofloxacin  12/30/2013    Vitals: BP (!) 177/68   Pulse 74   Ht 5' 4.5" (1.638 m)   Wt 214 lb 8 oz (97.3 kg)   BMI 36.25 kg/m  Last Weight:  Wt Readings from Last 1 Encounters:  12/06/16 214 lb 8 oz (97.3 kg)   VPX:TGGY mass index is 36.25 kg/m.     Last Height:   Ht Readings from Last 1 Encounters:  12/06/16 5' 4.5" (1.638 m)    Physical exam:  General: The patient is awake, alert and appears not in acute distress. The  patient is well groomed. Head: Normocephalic, atraumatic. Neck is supple. Mallampati 3,,  neck circumference:19. Nasal airflow congested , TMJ is not  evident . Retrognathia is not seen.  Cardiovascular:  Regular rate and rhythm , without  murmurs or carotid bruit, and without distended neck veins. Respiratory: Lungs are clear to auscultation. Skin:  Without evidence of edema, or rash Trunk: BMI is 36, morbidly obese . The patient's posture is erect  Neurologic exam : The patient is awake and alert, oriented to place and time.   Memory subjective described as intact.   Attention span & concentration ability appears normal.  Speech is fluent,  without dysarthria, dysphonia or aphasia.  Mood and affect are appropriate.  Cranial nerves: Pupils are equal and briskly reactive to light. Funduscopic exam without  evidence of pallor or edema.  Extraocular movements  in vertical and horizontal planes intact and without nystagmus. Visual fields by finger perimetry are intact. Hearing to finger rub intact.   Facial sensation intact to fine touch.  Facial motor strength is symmetric and tongue and uvula move midline. Shoulder shrug was symmetrical.   Motor exam:   Normal tone, muscle bulk and symmetric strength in all extremities. She has atrophied triceps muscles on both sides.  She has crepitation in both knees.  Left more than right.   Sensory:  Fine touch, pinprick and vibration were normal in both feet !  She reports normal sensation in both feet.   Coordination: Rapid alternating movements in the fingers/hands was normal. Finger-to-nose maneuver  normal without evidence of ataxia, dysmetria or tremor.  Gait and station: Patient walks without assistive device and is  able unassisted to climb up to the exam table. Strength within normal limits.  Stance is stable and normal. Tandem gait is unfragmented. Turns with 3-4 Steps.  Deep tendon reflexes: in the  upper and lower extremities are symmetric  and intact. Babinski maneuver response is downgoing.    Assessment:  After physical and neurologic examination, review of laboratory studies,  Personal review of imaging studies, reports of other /same  Imaging studies, results of polysomnography and / or neurophysiology testing and pre-existing records as far as provided in visit., my assessment is   1)  OSA _ Mrs. Janice [name] has been a compliant CPAP user but her machine is almost a decade old. I cannot longer gather therapeutic data from this machine. I would very much like for her to obtain a new one. I understand that Medicare will pay fully for a repeat sleep study. She would like to have her study after 12/30/2016. I will let my lap no. I have no doubt that she still has obstructive sleep apnea depending on her high-grade Mallampati, larger than average neck circumference, and her BMI. In addition there has been a lower muscle tone age related and certainly a deconditioning related to joint pain. She is physically less active than she used to be.  2) obesity - low carb diet, weight watcher. Guidelines discussed.   3) Insomnia unrelated to apnea. Uses benadryl and prn Sonata.    The patient was advised of the nature of the diagnosed disorder , the treatment options and the  risks for general health and wellness arising from not treating the condition.   I spent more than 45 minutes of face to face time with the patient. A dental device is not of interest to this patient , who loves and compliantly uses CPAP for 10 years.   Greater than 50% of time was spent in counseling and coordination of care. We have discussed the diagnosis and differential and I answered the patient's questions.    Plan:  Treatment plan and additional workup :  SPLIT night PSG with same night titration if AHI above 20.  New machine will need heated tubing and she is interested in a new Interface , a Risk manager and Paykel ESON nasal mask.  Insomnia booklet given.    RV  after sleep study, likely in 90 days after new CPAP has been issued.   Larey Seat, MD 4/70/9628, 3:66 AM  Certified in Neurology by ABPN Certified in [place] by [facility] [address], [place], [place] [address]

## 2016-12-18 DIAGNOSIS — R69 Illness, unspecified: Secondary | ICD-10-CM | POA: Diagnosis not present

## 2016-12-22 ENCOUNTER — Telehealth: Payer: Self-pay

## 2016-12-22 DIAGNOSIS — G4730 Sleep apnea, unspecified: Principal | ICD-10-CM

## 2016-12-22 DIAGNOSIS — G4710 Hypersomnia, unspecified: Secondary | ICD-10-CM

## 2016-12-22 NOTE — Telephone Encounter (Signed)
Patients insurance denied both in-lab and HST. I spoke with Jeneen Rinks from [employer]. He said if we could send original sleep study, FF visit and order he could get her a new machine. He can set her up on Auto if you want then after download change her to cpap.

## 2016-12-29 NOTE — Telephone Encounter (Signed)
I spoke to patient and she is aware that it was denied. Patient states that her last sleep study was done here? In 2006. Ladies in medical records are not available right now to pull records. I will check back on Monday to see if we can get see study.

## 2017-01-02 NOTE — Telephone Encounter (Signed)
Per Medical records, no sleep study on file. I guess we will wait for Dr. Brett Fairy to return so she can advise what to do.

## 2017-01-03 DIAGNOSIS — Z6836 Body mass index (BMI) 36.0-36.9, adult: Secondary | ICD-10-CM | POA: Diagnosis not present

## 2017-01-03 DIAGNOSIS — I10 Essential (primary) hypertension: Secondary | ICD-10-CM | POA: Diagnosis not present

## 2017-01-03 DIAGNOSIS — E119 Type 2 diabetes mellitus without complications: Secondary | ICD-10-CM | POA: Diagnosis not present

## 2017-01-04 NOTE — Telephone Encounter (Signed)
I am going to set up a Home study and file insurance . If we dont get paid we will eat the cost. This is the only way to get her a machine.

## 2017-01-13 DIAGNOSIS — R69 Illness, unspecified: Secondary | ICD-10-CM | POA: Diagnosis not present

## 2017-01-25 DIAGNOSIS — R69 Illness, unspecified: Secondary | ICD-10-CM | POA: Diagnosis not present

## 2017-02-07 DIAGNOSIS — R69 Illness, unspecified: Secondary | ICD-10-CM | POA: Diagnosis not present

## 2017-02-23 ENCOUNTER — Ambulatory Visit: Payer: Medicare HMO

## 2017-03-10 ENCOUNTER — Telehealth: Payer: Self-pay | Admitting: Neurology

## 2017-03-10 DIAGNOSIS — Z9989 Dependence on other enabling machines and devices: Secondary | ICD-10-CM

## 2017-03-10 DIAGNOSIS — G4733 Obstructive sleep apnea (adult) (pediatric): Secondary | ICD-10-CM

## 2017-03-10 NOTE — Telephone Encounter (Signed)
No  HST order in chart, can not release ,nor result. CD

## 2017-03-10 NOTE — Procedures (Signed)
Janice [name], [place] [address] NAME: Janice [name]   DOB: 08-04-1951 MEDICAL RECORD [id number]  DOS: 02/23/17 REFERRING PHYSICIAN: Berneta Sages, MD  STUDY PERFORMED: HST HISTORY: HPI:  Janice [name] is a 66 y.o. female, who was diagnosed with sleep apnea in the year 2006 and last followed with me in the years 2011. She still uses the same CPAP but has had trouble with condensation water accumulating at the interface. She still needs control of allergic rhinitis to allow a patent nasal airway. She did not use sleep aids. Other medical problems are insomnia ( controlled on SSRI ) , degenerative joint disease, allergic rhinitis, hypertension, GERD controlled with proton pump inhibitors, fatty liver disease, hyperlipidemia mixed, peripheral neuropathy, insulin-dependent diabetes mellitus, and above-named OSA. She used to have nocturnal cluster headaches and morning headaches before CPAP use. The patient endorsed snoring when not using CPAP.  Epworth Sleepiness Score :3 points, Fatigue severity score: 17points , and  depression score endorsed at 2/15 points. STUDY RESULTS: Total Recording Time: 7h 69m Total Apnea/Hypopnea Index (AHI): 31.9/ hr.  RDI was 33.8/hr. Average Oxygen Saturation: 91% Lowest Oxygen Saturation: Nadir SpO2 at 80% with total desaturation time of 92 minutes.  Average Heart Rate: 65 bpm (57- 65 bpm) IMPRESSION: Severe sleep apnea, obstructive type, associated with hypoxemia.  RECOMMENDATION: This patient needs to continue CPAP therapy, will order auto titration 5-20 cm water with heated humidity and mask of patient's choice.   I certify that I have reviewed the raw data recording prior to the issuance of this report in accordance with the standards of the American Academy of Sleep Medicine (AASM). Larey Seat, MD    03-09-2017  [facility] and AASM

## 2017-03-11 DIAGNOSIS — R69 Illness, unspecified: Secondary | ICD-10-CM | POA: Diagnosis not present

## 2017-03-13 ENCOUNTER — Telehealth: Payer: Self-pay | Admitting: Neurology

## 2017-03-13 NOTE — Telephone Encounter (Signed)
Patient called office returning RN's call. Per patient she will not be available today after 3.  Please call

## 2017-03-13 NOTE — Telephone Encounter (Signed)
Called pt to give sleep study results. No answer. LVM for pt to call back

## 2017-03-13 NOTE — Telephone Encounter (Signed)
-----   Message from Lester [place], RN sent at 03/13/2017  7:35 AM EDT -----   ----- Message ----- From: Larey Seat, MD Sent: 03/10/2017   1:06 PM To: Lester [place], RN, Carolan Shiver Hyler  Results given To Justin Mend, order placed.  Severe OSA and snoring as well as hypoxemia still present. Patient qualifies for new CPAP. Order autotitration 5-20 cm water with mask of her choice. Heated humidity. DME :  Please provide a CPAP that can be used as CPAP auto-titrator.

## 2017-03-14 NOTE — Telephone Encounter (Signed)
I called pt. I advised pt that Dr. Brett Fairy reviewed their sleep study results and found that pt has severe obstructive sleep apnea and snoring as well as low oxygen. Dr. Brett Fairy recommends that pt start cpap. I reviewed PAP compliance expectations with the pt. Pt is agreeable to starting a CPAP. I advised pt that an order will be sent to a DME, [facility], and [facility] will call the pt within about one week after they file with the pt's insurance. [facility] will show the pt how to use the machine, fit for masks, and troubleshoot the CPAP if needed. A follow up appt was made for insurance purposes with Dr. Brett Fairy on Sept 13 2018 at 10am. Pt verbalized understanding to arrive 15 minutes early and bring their CPAP. A letter with all of this information in it will be mailed to the pt as a reminder. I verified with the pt that the address we have on file is correct. Pt verbalized understanding of results. Pt had no questions at this time but was encouraged to call back if questions arise.

## 2017-03-14 NOTE — Telephone Encounter (Signed)
-----   Message from Lester [place], RN sent at 03/13/2017  7:35 AM EDT -----   ----- Message ----- From: Larey Seat, MD Sent: 03/10/2017   1:06 PM To: Lester [place], RN, Carolan Shiver Hyler  Results given To Justin Mend, order placed.  Severe OSA and snoring as well as hypoxemia still present. Patient qualifies for new CPAP. Order autotitration 5-20 cm water with mask of her choice. Heated humidity. DME :  Please provide a CPAP that can be used as CPAP auto-titrator.

## 2017-04-04 DIAGNOSIS — I10 Essential (primary) hypertension: Secondary | ICD-10-CM | POA: Diagnosis not present

## 2017-04-04 DIAGNOSIS — Z6834 Body mass index (BMI) 34.0-34.9, adult: Secondary | ICD-10-CM | POA: Diagnosis not present

## 2017-04-04 DIAGNOSIS — E119 Type 2 diabetes mellitus without complications: Secondary | ICD-10-CM | POA: Diagnosis not present

## 2017-04-07 DIAGNOSIS — G4733 Obstructive sleep apnea (adult) (pediatric): Secondary | ICD-10-CM | POA: Diagnosis not present

## 2017-04-29 DIAGNOSIS — R69 Illness, unspecified: Secondary | ICD-10-CM | POA: Diagnosis not present

## 2017-05-08 DIAGNOSIS — G4733 Obstructive sleep apnea (adult) (pediatric): Secondary | ICD-10-CM | POA: Diagnosis not present

## 2017-05-09 DIAGNOSIS — N760 Acute vaginitis: Secondary | ICD-10-CM | POA: Diagnosis not present

## 2017-05-10 DIAGNOSIS — Z6836 Body mass index (BMI) 36.0-36.9, adult: Secondary | ICD-10-CM | POA: Diagnosis not present

## 2017-05-10 DIAGNOSIS — R05 Cough: Secondary | ICD-10-CM | POA: Diagnosis not present

## 2017-05-10 DIAGNOSIS — J309 Allergic rhinitis, unspecified: Secondary | ICD-10-CM | POA: Diagnosis not present

## 2017-05-10 DIAGNOSIS — J209 Acute bronchitis, unspecified: Secondary | ICD-10-CM | POA: Diagnosis not present

## 2017-05-10 DIAGNOSIS — J45909 Unspecified asthma, uncomplicated: Secondary | ICD-10-CM | POA: Diagnosis not present

## 2017-05-10 DIAGNOSIS — E1165 Type 2 diabetes mellitus with hyperglycemia: Secondary | ICD-10-CM | POA: Diagnosis not present

## 2017-05-10 DIAGNOSIS — I10 Essential (primary) hypertension: Secondary | ICD-10-CM | POA: Diagnosis not present

## 2017-05-17 DIAGNOSIS — J45909 Unspecified asthma, uncomplicated: Secondary | ICD-10-CM | POA: Diagnosis not present

## 2017-05-17 DIAGNOSIS — Z6835 Body mass index (BMI) 35.0-35.9, adult: Secondary | ICD-10-CM | POA: Diagnosis not present

## 2017-05-17 DIAGNOSIS — J209 Acute bronchitis, unspecified: Secondary | ICD-10-CM | POA: Diagnosis not present

## 2017-05-17 DIAGNOSIS — J181 Lobar pneumonia, unspecified organism: Secondary | ICD-10-CM | POA: Diagnosis not present

## 2017-05-17 DIAGNOSIS — J309 Allergic rhinitis, unspecified: Secondary | ICD-10-CM | POA: Diagnosis not present

## 2017-05-17 DIAGNOSIS — R05 Cough: Secondary | ICD-10-CM | POA: Diagnosis not present

## 2017-05-17 DIAGNOSIS — E1165 Type 2 diabetes mellitus with hyperglycemia: Secondary | ICD-10-CM | POA: Diagnosis not present

## 2017-05-17 DIAGNOSIS — I10 Essential (primary) hypertension: Secondary | ICD-10-CM | POA: Diagnosis not present

## 2017-05-23 ENCOUNTER — Encounter: Payer: Self-pay | Admitting: Neurology

## 2017-05-24 DIAGNOSIS — R69 Illness, unspecified: Secondary | ICD-10-CM | POA: Diagnosis not present

## 2017-05-25 ENCOUNTER — Ambulatory Visit (INDEPENDENT_AMBULATORY_CARE_PROVIDER_SITE_OTHER): Payer: Medicare HMO | Admitting: Neurology

## 2017-05-25 ENCOUNTER — Encounter: Payer: Self-pay | Admitting: Neurology

## 2017-05-25 VITALS — BP 137/66 | HR 79 | Ht 64.0 in | Wt 212.0 lb

## 2017-05-25 DIAGNOSIS — G4733 Obstructive sleep apnea (adult) (pediatric): Secondary | ICD-10-CM

## 2017-05-25 DIAGNOSIS — Z9989 Dependence on other enabling machines and devices: Secondary | ICD-10-CM

## 2017-05-25 DIAGNOSIS — J209 Acute bronchitis, unspecified: Secondary | ICD-10-CM | POA: Diagnosis not present

## 2017-05-25 NOTE — Progress Notes (Signed)
SLEEP MEDICINE CLINIC   Provider:  Larey Seat, M D  Primary Care Physician:  Prince Solian, MD   Referring Provider: Prince Solian, MD   Chief Complaint  Patient presents with  . Follow-up    pt alone, room 10, cpap working well    HPI:  Janice [name] is a 66 y.o. female , seen here in a RV:  05-25-2017, the patient was issued a new CPAP after a HST confirmed the presence of OSA: She is a compliant user now, but she experienced bronchitis, Pneumonia and constant non- productive cough at night. Spasms of coughing, after 2 antibiotic rounds.  She has been prescribed a narcotic cough syrup with codeine and tried to discontinue this medication but couldn't. Just coughing too much still at night to get restful sleep, just last night took some cough syrup to allow her to sleep. She reports that the CPAP is not pleasant and comfortable to wear while she has sees respiratory tract infections which may partially be viral and partially be allergic in origin. I would like for her to start to take a daily Claritin or Allegra, nondrowsy making to at least eliminate the possibility of allergies playing a role. She gets choked during the day, too. No sleep related, not even food intake related. Epiglottis spasm?  I will give her the medical leave from CPAP use for the next 7 days until she has completely recovered. She should then start CPAP again. I quote here her last HST -    Severe OSA and snoring as well as hypoxemia still present. Patient qualifies for new CPAP. Order autotitration 5-20 cm water with mask of her choice. Heated humidity. DME : Please provide a CPAP that can be used as CPAP auto-titrator.  Narrative   Vernis Cabacungan, Asencion Partridge, MD   03/10/2017 1:02 PM [place], [place] [address] NAME: Janice [name]  DOB: 01/07/1951 MEDICAL RECORD [id number] DOS: 02/23/17 REFERRING PHYSICIAN: Berneta Sages, MD  STUDY PERFORMED: HST HISTORY: OSA. She used to have  nocturnal cluster  headaches and morning headaches before CPAP use. The patient  endorsed snoring when not using CPAP.  Epworth Sleepiness Score :3 points, Fatigue severity score:  17points , and depression score endorsed at 2/15 points. STUDY RESULTS: Total Recording Time: 7h 82m Total Apnea/Hypopnea Index (AHI): 31.9/ hr. RDI was 33.8/hr.  Average Oxygen Saturation: 91% Lowest Oxygen Saturation: Nadir SpO2 at 80% with total  desaturation time of 92 minutes.  Average Heart Rate: 65 bpm (57- 65 bpm) IMPRESSION: Severe sleep apnea, obstructive type, associated with  hypoxemia.  RECOMMENDATION: This patient needs to continue CPAP therapy, will  order auto titration 5-20 cm water with heated humidity and mask  of patient's choice.  I certify that I have reviewed the raw data recording prior to  the issuance of this report in accordance with the standards of  the American Academy of Sleep Medicine (AASM). Larey Seat, MD  03-09-2017  [facility] [place] and AASM     Last Resulted: 02/23/17 10:00             Scans on Order [id number]   Scan on 03/20/2017 7:27 AM by Justin Mend H : Technical dataScan on 03/20/2017 7:27 AM by Harriet Masson : Technical data          [phone] Revisit  from Dr. Dagmar Hait to reestablish sleep medicine care. Mrs. Bayle was diagnosed with sleep apnea in the year 2006 and  last followed with me in the years 2011 or 2012 . She still uses the same CPAP but has had trouble with condensation water accumulating at the interface. She still needs control of allergic rhinitis to allow a patent nasal airway. She did not use sleep aids. She has had Sonata prescribed as a when necessary medication but doesn't use it daily. Other medical problems are insomnia ( controlled on SSRI ) , degenerative joint disease, allergic rhinitis, hypertension, GERD controlled with proton pump inhibitors, fatty liver disease, hyperlipidemia mixed,  peripheral neuropathy, insulin-dependent diabetes mellitus, and above-named OSA. She used to have nocturnal cluster headaches and morning headaches before CPAP use.   Chief complaint according to patient : "I need a new machine , mine doesn't give data" Sleep habits are as follows: She is usually in bed by 10 PM, she shares a bedroom with her husband, she sleeps on one single pillow and usually on her right side. She often takes Benadryl at night and a B12 under the tongue supplement. She doesn't watch TV in the bedroom. Usually she can fall asleep promptly with her regimen she estimates 25 out of 30 nights she will sleep through until 12:30 and 3:30 AM when she has  bathroom breaks. She then goes back to bed until her alarm rings at 6 AM. At that time she has usually had between 6 and 7 hours of sleep. She does ride said she does not have vivid dreams, palpitations, diaphoresis, nausea or dizziness and is usually somewhat rested at the time her alarm rings. She will have a strong coffee in the morning and describes sometimes waking with a congested nose. Her mouth is dry. No headaches in AM .  Sleep medical history and family sleep history: no family history of sleep apnea, she has a brother and sister , both snore. Her father was a diabetic and developed congestive heart failure, he died of a stroke at age 43,  her mother had breast cancer with bone metastasis at age 73. She had in 2014 a right shoulder surgery and in 2015 a left shoulder rotator cuff repair which have influenced her sleep position. 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 She also works part time at [employer]. The patient has never used tobacco products, she drinks iced tea, up to 2 cups of coffee in the morning, no sodas. She may drink a glass of wine twice a year. She has never been a night shift worker. No children.   Review of  Systems: Out of a complete 14 system review, the patient complains of only the following symptoms, and all other reviewed systems are negative. The patient endorsed snoring when not using CPAP, allergic rhinitis, insomnia, diabetic neuropathy, hypertension  Epworth score 3 , Fatigue severity score 21 , depression score 2/15    Social History   Social History  . Marital status: Married    Spouse name: N/A  . Number of children: 0  . Years of education: 3 years college   Occupational History  . Unemployed    Social History Main Topics  . Smoking status: Never Smoker  . Smokeless tobacco: Never Used  . Alcohol use Yes     Comment: 3 glasses per year  . Drug use: No  . Sexual activity: No   Other Topics Concern  . Not on file   Social History Narrative  1 caffeine drink daily    Right-handed   Lives at home with husband.    Family History  Problem Relation Age of Onset  . Breast cancer Mother   . Bone cancer Mother   . Heart failure Father   . Colon cancer Neg Hx   . Rectal cancer Neg Hx   . Stomach cancer Neg Hx     Past Medical History:  Diagnosis Date  . Allergy   . Common migraine   . Complication of anesthesia    Per pt/had breathing problems with "block" during rotator cuff surgery.  . DDD (degenerative disc disease)   . Depression   . Diabetes mellitus   . Diabetic peripheral neuropathy ([place])   . Diverticulitis   . Diverticulosis   . DJD (degenerative joint disease)   . GERD (gastroesophageal reflux disease)   . History of colon polyps    hyperplastic  . Hypertension   . Obesity   . Sleep apnea   . Sleep apnea, obstructive     Past Surgical History:  Procedure Laterality Date  . ANKLE SURGERY     Left   . SHOULDER SURGERY     left rotator cuff in 2015/right rotator cuff in 2012    Current Outpatient Prescriptions  Medication Sig Dispense Refill  . acetaminophen (TYLENOL) 500 MG tablet Take 500 mg by mouth every 6 (six) hours as needed.       Marland Kitchen amLODipine (NORVASC) 10 MG tablet Take 10 mg by mouth daily.    . Calcium Carb-Cholecalciferol (CALCIUM 1000 + D PO) Take by mouth.      . cetirizine (KLS ALLER-TEC) 10 MG tablet Take 10 mg by mouth daily.    Marland Kitchen desoximetasone (TOPICORT) 0.25 % cream Apply 1 application topically. Use on rash 6 times daily    . diphenhydrAMINE (BENADRYL) 25 MG tablet Take 25 mg by mouth at bedtime as needed.    . Flaxseed, Linseed, OIL Take by mouth. Twice daily     . fluticasone (VERAMYST) 27.5 MCG/SPRAY nasal spray Place 2 sprays into the nose daily.     Marland Kitchen gabapentin (NEURONTIN) 300 MG capsule Take 300 mg by mouth. Take  2 pills at hs    . ibuprofen (ADVIL,MOTRIN) 200 MG tablet Take 200 mg by mouth every 6 (six) hours as needed.    . Insulin Glargine (TOUJEO SOLOSTAR [place]) Inject into the skin. As directed    . irbesartan (AVAPRO) 300 MG tablet     . metFORMIN (GLUCOPHAGE XR) 500 MG 24 hr tablet Take 1,000 mg by mouth 2 (two) times daily.     Marland Kitchen NOVOLOG FLEXPEN 100 UNIT/ML FlexPen     . nystatin-triamcinolone (MYCOLOG II) cream Apply 1 application topically as needed.    Marland Kitchen omeprazole (PRILOSEC) 40 MG capsule     . PARoxetine (PAXIL) 20 MG tablet Take 10 mg by mouth daily with supper.     Vladimir Faster Glycol-Propyl Glycol (SYSTANE OP) Apply to eye. Use 2 drops in each eye every 6 hours    . terconazole (TERAZOL 7) 0.4 % vaginal cream Place 1 applicator vaginally as needed.    . trimethoprim-polymyxin b (POLYTRIM) ophthalmic solution Place 1 drop into the right eye every 4 (four) hours. 10 mL 0  . vitamin C (ASCORBIC ACID) 500 MG tablet Take 500 mg by mouth. Take one pill 3 times a week    . zaleplon (SONATA) 10 MG capsule Take 10 mg by mouth at bedtime as needed. Reported on  11/29/2015     Current Facility-Administered Medications  Medication Dose Route Frequency Provider Last Rate Last Dose  . 0.9 %  sodium chloride infusion  500 mL Intravenous Continuous Milus Banister, MD        Allergies as of  05/25/2017 - Review Complete 05/25/2017  Allergen Reaction Noted  . Iodine  07/19/2016  . Latex Hives 09/28/2013  . Tessalon perles [benzonatate] Anaphylaxis 12/06/2016  . Zocor [simvastatin]  01/12/2013  . Phenergan [promethazine]  01/12/2013  . Wellbutrin [bupropion]  01/12/2013  . Doxycycline  12/30/2013  . Levofloxacin  12/30/2013    Vitals: BP 137/66   Pulse 79   Ht 5\' 4"  (1.626 m)   Wt 212 lb (96.2 kg)   BMI 36.39 kg/m  Last Weight:  Wt Readings from Last 1 Encounters:  05/25/17 212 lb (96.2 kg)   SWF:UXNA mass index is 36.39 kg/m.     Last Height:   Ht Readings from Last 1 Encounters:  05/25/17 5\' 4"  (1.626 m)    Physical exam:  General: The patient is awake, alert and appears not in acute distress. The patient is well groomed. Head: Normocephalic, atraumatic. Neck is supple. Mallampati 3,,  neck circumference:19. Nasal airflow congested , TMJ is not  evident . Retrognathia is not seen.  Cardiovascular:  Regular rate and rhythm , without  murmurs or carotid bruit, and without distended neck veins. Respiratory: Lungs are clear to auscultation. Skin:  Without evidence of edema, or rash Trunk: BMI is 36, morbidly obese . The patient's posture is erect  Neurologic exam : The patient is awake and alert, oriented to place and time.   Memory subjective described as intact.   Attention span & concentration ability appears normal.  Speech is fluent,  without dysarthria, dysphonia or aphasia.  Mood and affect are appropriate.  Cranial nerves:  change in taste and smell during URI. Pupils are equal and briskly reactive to light. Funduscopic exam without  evidence of pallor or edema.  Extraocular movements  in vertical and horizontal planes intact and without nystagmus. Visual fields by finger perimetry are intact. Hearing to finger rub intact.   Facial sensation intact to fine touch.  Facial motor strength is symmetric and tongue and uvula move midline. Shoulder shrug  was symmetrical.   Motor exam:   Normal tone, muscle bulk and symmetric strength in all extremities. She has atrophied triceps muscles on both sides.  She has crepitation in both knees.  Left more than right.  She reports normal sensation in both feet.   Assessment:  After physical and neurologic examination, review of laboratory studies,  Personal review of imaging studies, reports of other /same  Imaging studies, results of polysomnography and / or neurophysiology testing and pre-existing records as far as provided in visit., my assessment is   1)  OSA , confirmed and severe by HST- new auto-CPAP issued. RV after sleep study, likely in 90 days after new CPAP has been issued.  The patient's download from her CPAP showed that she is 100% compliance for the last 30 days with an average user time of sleep 7 hours 30 minutes, auto titration between 5 and 20 cm water with 3 cm EPR, 95th percentile pressure is 11.2 cm water, she does have moderate air leaks, residual AHI is 1.0. The needs to be no change of settings. This is an excellent result. I will allow her a 7 day break form CPAP to see if her URI clears up, and she will  need to clean the machine very carefully.   Larey Seat, MD 9/77/4142, 39:53 AM  Certified in Neurology by ABPN Certified in [place] by [facility] [address] [place], [place] [address]

## 2017-05-25 NOTE — Patient Instructions (Signed)

## 2017-05-29 ENCOUNTER — Telehealth: Payer: Self-pay | Admitting: Neurology

## 2017-05-29 DIAGNOSIS — E119 Type 2 diabetes mellitus without complications: Secondary | ICD-10-CM | POA: Diagnosis not present

## 2017-05-29 DIAGNOSIS — J3089 Other allergic rhinitis: Secondary | ICD-10-CM | POA: Diagnosis not present

## 2017-05-29 DIAGNOSIS — K219 Gastro-esophageal reflux disease without esophagitis: Secondary | ICD-10-CM | POA: Diagnosis not present

## 2017-05-29 DIAGNOSIS — Z6835 Body mass index (BMI) 35.0-35.9, adult: Secondary | ICD-10-CM | POA: Diagnosis not present

## 2017-05-29 DIAGNOSIS — J45909 Unspecified asthma, uncomplicated: Secondary | ICD-10-CM | POA: Diagnosis not present

## 2017-05-29 DIAGNOSIS — J181 Lobar pneumonia, unspecified organism: Secondary | ICD-10-CM | POA: Diagnosis not present

## 2017-05-29 NOTE — Telephone Encounter (Signed)
Patient called office in reference to being off of CPAP for 7 days per patient she had not issues with her throat closing up while coughing.  She would like to know if she is able to go back on the CPAP machine full time.  Patients PCP feels she is having these symptoms due to more reflux related.  Please call

## 2017-05-30 NOTE — Telephone Encounter (Signed)
Called the pt to discuss her concern after speaking with Dr.Dohmeier. Dr. Brett Fairy would like the pt to resume her CPAP machine. She wanted to make sure that the pt was not coughing up mucous that could cause problems with that CPAP. Pt verbalized understanding and had no further questions.

## 2017-05-31 DIAGNOSIS — B356 Tinea cruris: Secondary | ICD-10-CM | POA: Diagnosis not present

## 2017-06-02 DIAGNOSIS — L508 Other urticaria: Secondary | ICD-10-CM | POA: Diagnosis not present

## 2017-06-02 DIAGNOSIS — J209 Acute bronchitis, unspecified: Secondary | ICD-10-CM | POA: Diagnosis not present

## 2017-06-08 DIAGNOSIS — G4733 Obstructive sleep apnea (adult) (pediatric): Secondary | ICD-10-CM | POA: Diagnosis not present

## 2017-06-15 DIAGNOSIS — Z6835 Body mass index (BMI) 35.0-35.9, adult: Secondary | ICD-10-CM | POA: Diagnosis not present

## 2017-06-15 DIAGNOSIS — R05 Cough: Secondary | ICD-10-CM | POA: Diagnosis not present

## 2017-06-15 DIAGNOSIS — J45909 Unspecified asthma, uncomplicated: Secondary | ICD-10-CM | POA: Diagnosis not present

## 2017-06-15 DIAGNOSIS — I10 Essential (primary) hypertension: Secondary | ICD-10-CM | POA: Diagnosis not present

## 2017-06-15 DIAGNOSIS — J181 Lobar pneumonia, unspecified organism: Secondary | ICD-10-CM | POA: Diagnosis not present

## 2017-06-15 DIAGNOSIS — E119 Type 2 diabetes mellitus without complications: Secondary | ICD-10-CM | POA: Diagnosis not present

## 2017-06-21 NOTE — Telephone Encounter (Signed)
Communication was completed

## 2017-07-06 DIAGNOSIS — Z6836 Body mass index (BMI) 36.0-36.9, adult: Secondary | ICD-10-CM | POA: Diagnosis not present

## 2017-07-06 DIAGNOSIS — E11621 Type 2 diabetes mellitus with foot ulcer: Secondary | ICD-10-CM | POA: Diagnosis not present

## 2017-07-06 DIAGNOSIS — R69 Illness, unspecified: Secondary | ICD-10-CM | POA: Diagnosis not present

## 2017-07-06 DIAGNOSIS — Z23 Encounter for immunization: Secondary | ICD-10-CM | POA: Diagnosis not present

## 2017-07-06 DIAGNOSIS — L97529 Non-pressure chronic ulcer of other part of left foot with unspecified severity: Secondary | ICD-10-CM | POA: Diagnosis not present

## 2017-07-06 DIAGNOSIS — E1165 Type 2 diabetes mellitus with hyperglycemia: Secondary | ICD-10-CM | POA: Diagnosis not present

## 2017-07-08 DIAGNOSIS — G4733 Obstructive sleep apnea (adult) (pediatric): Secondary | ICD-10-CM | POA: Diagnosis not present

## 2017-07-20 DIAGNOSIS — R69 Illness, unspecified: Secondary | ICD-10-CM | POA: Diagnosis not present

## 2017-07-22 DIAGNOSIS — H2513 Age-related nuclear cataract, bilateral: Secondary | ICD-10-CM | POA: Diagnosis not present

## 2017-07-22 DIAGNOSIS — I10 Essential (primary) hypertension: Secondary | ICD-10-CM | POA: Diagnosis not present

## 2017-07-22 DIAGNOSIS — E119 Type 2 diabetes mellitus without complications: Secondary | ICD-10-CM | POA: Diagnosis not present

## 2017-07-22 DIAGNOSIS — H16423 Pannus (corneal), bilateral: Secondary | ICD-10-CM | POA: Diagnosis not present

## 2017-07-26 DIAGNOSIS — R05 Cough: Secondary | ICD-10-CM | POA: Diagnosis not present

## 2017-07-26 DIAGNOSIS — J329 Chronic sinusitis, unspecified: Secondary | ICD-10-CM | POA: Diagnosis not present

## 2017-07-26 DIAGNOSIS — Z6835 Body mass index (BMI) 35.0-35.9, adult: Secondary | ICD-10-CM | POA: Diagnosis not present

## 2017-08-01 DIAGNOSIS — E119 Type 2 diabetes mellitus without complications: Secondary | ICD-10-CM | POA: Diagnosis not present

## 2017-08-01 DIAGNOSIS — I10 Essential (primary) hypertension: Secondary | ICD-10-CM | POA: Diagnosis not present

## 2017-08-01 DIAGNOSIS — E782 Mixed hyperlipidemia: Secondary | ICD-10-CM | POA: Diagnosis not present

## 2017-08-01 DIAGNOSIS — R82998 Other abnormal findings in urine: Secondary | ICD-10-CM | POA: Diagnosis not present

## 2017-08-02 DIAGNOSIS — Z6835 Body mass index (BMI) 35.0-35.9, adult: Secondary | ICD-10-CM | POA: Diagnosis not present

## 2017-08-02 DIAGNOSIS — J209 Acute bronchitis, unspecified: Secondary | ICD-10-CM | POA: Diagnosis not present

## 2017-08-02 DIAGNOSIS — R05 Cough: Secondary | ICD-10-CM | POA: Diagnosis not present

## 2017-08-06 DIAGNOSIS — R69 Illness, unspecified: Secondary | ICD-10-CM | POA: Diagnosis not present

## 2017-08-08 DIAGNOSIS — G4733 Obstructive sleep apnea (adult) (pediatric): Secondary | ICD-10-CM | POA: Diagnosis not present

## 2017-08-10 DIAGNOSIS — E119 Type 2 diabetes mellitus without complications: Secondary | ICD-10-CM | POA: Diagnosis not present

## 2017-08-10 DIAGNOSIS — Z0000 Encounter for general adult medical examination without abnormal findings: Secondary | ICD-10-CM | POA: Diagnosis not present

## 2017-08-10 DIAGNOSIS — G4733 Obstructive sleep apnea (adult) (pediatric): Secondary | ICD-10-CM | POA: Diagnosis not present

## 2017-08-10 DIAGNOSIS — R69 Illness, unspecified: Secondary | ICD-10-CM | POA: Diagnosis not present

## 2017-08-10 DIAGNOSIS — I10 Essential (primary) hypertension: Secondary | ICD-10-CM | POA: Diagnosis not present

## 2017-08-10 DIAGNOSIS — G6289 Other specified polyneuropathies: Secondary | ICD-10-CM | POA: Diagnosis not present

## 2017-08-10 DIAGNOSIS — E11621 Type 2 diabetes mellitus with foot ulcer: Secondary | ICD-10-CM | POA: Diagnosis not present

## 2017-08-10 DIAGNOSIS — E782 Mixed hyperlipidemia: Secondary | ICD-10-CM | POA: Diagnosis not present

## 2017-08-10 DIAGNOSIS — Z794 Long term (current) use of insulin: Secondary | ICD-10-CM | POA: Diagnosis not present

## 2017-08-10 DIAGNOSIS — J209 Acute bronchitis, unspecified: Secondary | ICD-10-CM | POA: Diagnosis not present

## 2017-09-07 DIAGNOSIS — G4733 Obstructive sleep apnea (adult) (pediatric): Secondary | ICD-10-CM | POA: Diagnosis not present

## 2017-09-21 DIAGNOSIS — Z794 Long term (current) use of insulin: Secondary | ICD-10-CM | POA: Diagnosis not present

## 2017-09-21 DIAGNOSIS — Z6836 Body mass index (BMI) 36.0-36.9, adult: Secondary | ICD-10-CM | POA: Diagnosis not present

## 2017-09-21 DIAGNOSIS — I10 Essential (primary) hypertension: Secondary | ICD-10-CM | POA: Diagnosis not present

## 2017-09-21 DIAGNOSIS — E1165 Type 2 diabetes mellitus with hyperglycemia: Secondary | ICD-10-CM | POA: Diagnosis not present

## 2017-10-08 DIAGNOSIS — G4733 Obstructive sleep apnea (adult) (pediatric): Secondary | ICD-10-CM | POA: Diagnosis not present

## 2017-11-08 DIAGNOSIS — G4733 Obstructive sleep apnea (adult) (pediatric): Secondary | ICD-10-CM | POA: Diagnosis not present

## 2017-12-06 DIAGNOSIS — G4733 Obstructive sleep apnea (adult) (pediatric): Secondary | ICD-10-CM | POA: Diagnosis not present

## 2018-01-01 DIAGNOSIS — M79676 Pain in unspecified toe(s): Secondary | ICD-10-CM | POA: Diagnosis not present

## 2018-01-01 DIAGNOSIS — J45909 Unspecified asthma, uncomplicated: Secondary | ICD-10-CM | POA: Diagnosis not present

## 2018-01-01 DIAGNOSIS — E1165 Type 2 diabetes mellitus with hyperglycemia: Secondary | ICD-10-CM | POA: Diagnosis not present

## 2018-01-01 DIAGNOSIS — G6289 Other specified polyneuropathies: Secondary | ICD-10-CM | POA: Diagnosis not present

## 2018-01-01 DIAGNOSIS — G629 Polyneuropathy, unspecified: Secondary | ICD-10-CM | POA: Diagnosis not present

## 2018-01-01 DIAGNOSIS — Z794 Long term (current) use of insulin: Secondary | ICD-10-CM | POA: Diagnosis not present

## 2018-01-01 DIAGNOSIS — R002 Palpitations: Secondary | ICD-10-CM | POA: Diagnosis not present

## 2018-01-01 DIAGNOSIS — Z6836 Body mass index (BMI) 36.0-36.9, adult: Secondary | ICD-10-CM | POA: Diagnosis not present

## 2018-01-01 DIAGNOSIS — J209 Acute bronchitis, unspecified: Secondary | ICD-10-CM | POA: Diagnosis not present

## 2018-01-01 DIAGNOSIS — I10 Essential (primary) hypertension: Secondary | ICD-10-CM | POA: Diagnosis not present

## 2018-01-01 DIAGNOSIS — F3289 Other specified depressive episodes: Secondary | ICD-10-CM | POA: Diagnosis not present

## 2018-01-06 DIAGNOSIS — G4733 Obstructive sleep apnea (adult) (pediatric): Secondary | ICD-10-CM | POA: Diagnosis not present

## 2018-01-11 DIAGNOSIS — M25551 Pain in right hip: Secondary | ICD-10-CM | POA: Diagnosis not present

## 2018-01-11 DIAGNOSIS — Z6837 Body mass index (BMI) 37.0-37.9, adult: Secondary | ICD-10-CM | POA: Diagnosis not present

## 2018-01-11 DIAGNOSIS — R21 Rash and other nonspecific skin eruption: Secondary | ICD-10-CM | POA: Diagnosis not present

## 2018-01-11 DIAGNOSIS — I10 Essential (primary) hypertension: Secondary | ICD-10-CM | POA: Diagnosis not present

## 2018-01-11 DIAGNOSIS — E1165 Type 2 diabetes mellitus with hyperglycemia: Secondary | ICD-10-CM | POA: Diagnosis not present

## 2018-01-19 DIAGNOSIS — M25551 Pain in right hip: Secondary | ICD-10-CM | POA: Diagnosis not present

## 2018-01-19 DIAGNOSIS — R21 Rash and other nonspecific skin eruption: Secondary | ICD-10-CM | POA: Diagnosis not present

## 2018-01-19 DIAGNOSIS — B0229 Other postherpetic nervous system involvement: Secondary | ICD-10-CM | POA: Diagnosis not present

## 2018-01-19 DIAGNOSIS — E1165 Type 2 diabetes mellitus with hyperglycemia: Secondary | ICD-10-CM | POA: Diagnosis not present

## 2018-02-13 DIAGNOSIS — R300 Dysuria: Secondary | ICD-10-CM | POA: Diagnosis not present

## 2018-02-13 DIAGNOSIS — B0229 Other postherpetic nervous system involvement: Secondary | ICD-10-CM | POA: Diagnosis not present

## 2018-02-13 DIAGNOSIS — Z794 Long term (current) use of insulin: Secondary | ICD-10-CM | POA: Diagnosis not present

## 2018-02-13 DIAGNOSIS — Z6838 Body mass index (BMI) 38.0-38.9, adult: Secondary | ICD-10-CM | POA: Diagnosis not present

## 2018-02-13 DIAGNOSIS — I10 Essential (primary) hypertension: Secondary | ICD-10-CM | POA: Diagnosis not present

## 2018-02-13 DIAGNOSIS — E1165 Type 2 diabetes mellitus with hyperglycemia: Secondary | ICD-10-CM | POA: Diagnosis not present

## 2018-03-27 DIAGNOSIS — M1712 Unilateral primary osteoarthritis, left knee: Secondary | ICD-10-CM | POA: Diagnosis not present

## 2018-05-03 DIAGNOSIS — M1712 Unilateral primary osteoarthritis, left knee: Secondary | ICD-10-CM | POA: Diagnosis not present

## 2018-05-10 DIAGNOSIS — R300 Dysuria: Secondary | ICD-10-CM | POA: Diagnosis not present

## 2018-05-10 DIAGNOSIS — N390 Urinary tract infection, site not specified: Secondary | ICD-10-CM | POA: Diagnosis not present

## 2018-05-13 HISTORY — PX: UPPER GI ENDOSCOPY: SHX6162

## 2018-05-13 HISTORY — PX: COLONOSCOPY: SHX174

## 2018-05-16 DIAGNOSIS — E1165 Type 2 diabetes mellitus with hyperglycemia: Secondary | ICD-10-CM | POA: Diagnosis not present

## 2018-05-16 DIAGNOSIS — Z01818 Encounter for other preprocedural examination: Secondary | ICD-10-CM | POA: Diagnosis not present

## 2018-05-16 DIAGNOSIS — R002 Palpitations: Secondary | ICD-10-CM | POA: Diagnosis not present

## 2018-05-16 DIAGNOSIS — I10 Essential (primary) hypertension: Secondary | ICD-10-CM | POA: Diagnosis not present

## 2018-05-16 DIAGNOSIS — G4733 Obstructive sleep apnea (adult) (pediatric): Secondary | ICD-10-CM | POA: Diagnosis not present

## 2018-05-16 DIAGNOSIS — J45998 Other asthma: Secondary | ICD-10-CM | POA: Diagnosis not present

## 2018-05-16 DIAGNOSIS — B0229 Other postherpetic nervous system involvement: Secondary | ICD-10-CM | POA: Diagnosis not present

## 2018-05-16 DIAGNOSIS — E119 Type 2 diabetes mellitus without complications: Secondary | ICD-10-CM | POA: Diagnosis not present

## 2018-05-16 DIAGNOSIS — K219 Gastro-esophageal reflux disease without esophagitis: Secondary | ICD-10-CM | POA: Diagnosis not present

## 2018-05-17 DIAGNOSIS — N870 Mild cervical dysplasia: Secondary | ICD-10-CM | POA: Diagnosis not present

## 2018-05-17 DIAGNOSIS — Z6837 Body mass index (BMI) 37.0-37.9, adult: Secondary | ICD-10-CM | POA: Diagnosis not present

## 2018-05-17 DIAGNOSIS — N889 Noninflammatory disorder of cervix uteri, unspecified: Secondary | ICD-10-CM | POA: Diagnosis not present

## 2018-05-17 DIAGNOSIS — N761 Subacute and chronic vaginitis: Secondary | ICD-10-CM | POA: Diagnosis not present

## 2018-05-17 DIAGNOSIS — E611 Iron deficiency: Secondary | ICD-10-CM | POA: Diagnosis not present

## 2018-05-17 DIAGNOSIS — Z124 Encounter for screening for malignant neoplasm of cervix: Secondary | ICD-10-CM | POA: Diagnosis not present

## 2018-05-28 ENCOUNTER — Encounter

## 2018-05-28 ENCOUNTER — Ambulatory Visit: Payer: Medicare HMO | Admitting: Neurology

## 2018-05-28 ENCOUNTER — Ambulatory Visit: Payer: Medicare HMO | Admitting: Physician Assistant

## 2018-05-28 ENCOUNTER — Encounter: Payer: Self-pay | Admitting: Physician Assistant

## 2018-05-28 VITALS — BP 140/60 | HR 76 | Ht 64.5 in | Wt 227.8 lb

## 2018-05-28 DIAGNOSIS — D509 Iron deficiency anemia, unspecified: Secondary | ICD-10-CM | POA: Diagnosis not present

## 2018-05-28 MED ORDER — FERROUS SULFATE 325 (65 FE) MG PO TABS: 325.0000 mg | ORAL_TABLET | Freq: Every day | ORAL | 3 refills | Status: DC

## 2018-05-28 NOTE — Addendum Note (Signed)
Addended by: Larina Bras on: 05/28/2018 03:23 PM   Modules accepted: Orders

## 2018-05-28 NOTE — Progress Notes (Signed)
I agree with the above note, plan 

## 2018-05-28 NOTE — Patient Instructions (Addendum)
You may have a light breakfast the morning of prep day (the day before the procedure).   You may choose from one of the following items: eggs and toast OR chicken noodle soup and crackers.   You should have your breakfast completed between 8:00 and 9:00 am the day before your procedure.    After you have had your light breakfast you should start a clear liquid diet only, NO SOLIDS. No additional solid food is allowed. You may continue to have clear liquid up to 3 hours prior to your procedure.        We have sent the following medications to your pharmacy for you to pick up at your convenience: [place].  1. Ferrous Sulfate 325 mg.   You have been scheduled for an endoscopy and colonoscopy. Please follow the written instructions given to you at your visit today. Please pick up your prep supplies at the pharmacy within the next 1-3 days. If you use inhalers (even only as needed), please bring them with you on the day of your procedure. Your physician has requested that you go to www.startemmi.com and enter the access code given to you at your visit today. This web site gives a general overview about your procedure. However, you should still follow specific instructions given to you by our office regarding your preparation for the procedure.

## 2018-05-28 NOTE — Progress Notes (Signed)
Chief Complaint: Iron Deficiency Anemia  HPI:    Janice [name] is a 67 year old female with a past medical history as listed below, known to Dr. Ardis Hughs, who was referred to me by Prince Solian, MD for a complaint of anemia.      08/01/2016 colonoscopy with 3 4-6 mm polyps in the rectum, at the hepatic flexure and in the ascending colon, removed with cold snare, diverticulosis in the entire examined colon and otherwise normal.  Pathology showed adenomatous polyps and repeat was recommended in 3 years.    05/17/2018 ferritin low at 9.8, iron saturation low at 6%, total iron binding capacity high at 454, hemoglobin low at 10.7, MCV low at 75.  CMP with a minimally elevated ALT at 46.    Today, patient explains that she went to her primary care provider to get a preprocedural approval for a knee replacement.  She had labs which showed she was anemic.  Patient tells me that she has not had any GI issues and has seen no bright red blood or black tarry stools.  Does admit that she was on Ibuprofen multiple times a day for about 2-1/2 months for shingles from April until June but was told to stop this as it may damage her stomach.  She has not taken any over the past few months.    Social history is positive for going through some financial difficulties recently.  Patient tells me her husband was diagnosed with cirrhosis and had to quit working and she has been out of work due to illness recently and was hoping to get her knee replacement this year but has had to save money for this procedure and is unsure if she can afford this one.    Denies fever, chills, shortness of breath, palpitations, heartburn, reflux, nausea, vomiting, change in bowel habits or abdominal pain.  Past Medical History:  Diagnosis Date  . Allergy   . Common migraine   . Complication of anesthesia    Per pt/had breathing problems with "block" during rotator cuff surgery.  . DDD (degenerative disc disease)   . Depression   .  Diabetes mellitus   . Diabetic peripheral neuropathy ([place])   . Diverticulitis   . Diverticulosis   . DJD (degenerative joint disease)   . GERD (gastroesophageal reflux disease)   . History of colon polyps    hyperplastic  . Hypertension   . Obesity   . Sleep apnea   . Sleep apnea, obstructive     Past Surgical History:  Procedure Laterality Date  . ANKLE SURGERY     Left   . SHOULDER SURGERY     left rotator cuff in 2015/right rotator cuff in 2012    Current Outpatient Medications  Medication Sig Dispense Refill  . acetaminophen (TYLENOL) 500 MG tablet Take 500 mg by mouth every 6 (six) hours as needed.      Marland Kitchen amLODipine (NORVASC) 10 MG tablet Take 10 mg by mouth daily.    . Calcium Carb-Cholecalciferol (CALCIUM 1000 + D PO) Take by mouth.      . cetirizine (KLS ALLER-TEC) 10 MG tablet Take 10 mg by mouth daily.    Marland Kitchen desoximetasone (TOPICORT) 0.25 % cream Apply 1 application topically. Use on rash 6 times daily    . Flaxseed, Linseed, OIL Take by mouth. Twice daily     . fluticasone (VERAMYST) 27.5 MCG/SPRAY nasal spray Place 2 sprays into the nose daily.     Marland Kitchen gabapentin (NEURONTIN) 300 MG  capsule Take 300 mg by mouth. Take  2 pills at hs    . ibuprofen (ADVIL,MOTRIN) 200 MG tablet Take 200 mg by mouth every 6 (six) hours as needed.    . Insulin Glargine (TOUJEO SOLOSTAR [place]) Inject into the skin. As directed    . irbesartan (AVAPRO) 300 MG tablet     . metFORMIN (GLUCOPHAGE XR) 500 MG 24 hr tablet Take 1,000 mg by mouth 2 (two) times daily.     Marland Kitchen NOVOLOG FLEXPEN 100 UNIT/ML FlexPen     . nystatin-triamcinolone (MYCOLOG II) cream Apply 1 application topically as needed.    Marland Kitchen omeprazole (PRILOSEC) 40 MG capsule     . PARoxetine (PAXIL) 20 MG tablet Take 10 mg by mouth daily with supper.     Vladimir Faster Glycol-Propyl Glycol (SYSTANE OP) Apply to eye. Use 2 drops in each eye every 6 hours    . terconazole (TERAZOL 7) 0.4 % vaginal cream Place 1 applicator vaginally as needed.      . trimethoprim-polymyxin b (POLYTRIM) ophthalmic solution Place 1 drop into the right eye every 4 (four) hours. 10 mL 0  . vitamin C (ASCORBIC ACID) 500 MG tablet Take 500 mg by mouth. Take one pill 3 times a week    . zaleplon (SONATA) 10 MG capsule Take 10 mg by mouth at bedtime as needed. Reported on 11/29/2015     Current Facility-Administered Medications  Medication Dose Route Frequency Provider Last Rate Last Dose  . 0.9 %  sodium chloride infusion  500 mL Intravenous Continuous Milus Banister, MD        Allergies as of 05/28/2018 - Review Complete 05/25/2017  Allergen Reaction Noted  . Iodine  07/19/2016  . Latex Hives 09/28/2013  . Tessalon perles [benzonatate] Anaphylaxis 12/06/2016  . Zocor [simvastatin]  01/12/2013  . Phenergan [promethazine]  01/12/2013  . Wellbutrin [bupropion]  01/12/2013  . Doxycycline  12/30/2013  . Levofloxacin  12/30/2013    Family History  Problem Relation Age of Onset  . Breast cancer Mother   . Bone cancer Mother   . Heart failure Father   . Colon cancer Neg Hx   . Rectal cancer Neg Hx   . Stomach cancer Neg Hx     Social History   Socioeconomic History  . Marital status: Married    Spouse name: Not on file  . Number of children: 0  . Years of education: 3 years college  . Highest education level: Not on file  Occupational History  . Occupation: Unemployed  Social Needs  . Financial resource strain: Not on file  . Food insecurity:    Worry: Not on file    Inability: Not on file  . Transportation needs:    Medical: Not on file    Non-medical: Not on file  Tobacco Use  . Smoking status: Never Smoker  . Smokeless tobacco: Never Used  Substance and Sexual Activity  . Alcohol use: Yes    Comment: 3 glasses per year  . Drug use: No  . Sexual activity: Never    Birth control/protection: None  Lifestyle  . Physical activity:    Days per week: Not on file    Minutes per session: Not on file  . Stress: Not on file   Relationships  . Social connections:    Talks on phone: Not on file    Gets together: Not on file    Attends religious service: Not on file    Active member of  club or organization: Not on file    Attends meetings of clubs or organizations: Not on file    Relationship status: Not on file  . Intimate partner violence:    Fear of current or ex partner: Not on file    Emotionally abused: Not on file    Physically abused: Not on file    Forced sexual activity: Not on file  Other Topics Concern  . Not on file  Social History Narrative   1 caffeine drink daily    Right-handed   Lives at home with husband.    Review of Systems:    Constitutional: No weight loss, fever or chills Cardiovascular: No chest pain Respiratory: No SOB  Gastrointestinal: See HPI and otherwise negative   Physical Exam:  Vital signs: BP 140/60   Pulse 76 Comment: slightly irregular  Ht 5' 4.5" (1.638 m)   Wt 227 lb 12.8 oz (103.3 kg)   BMI 38.50 kg/m   Constitutional:   Pleasant overweight Caucasian female appears to be in NAD, Well developed, Well nourished, alert and cooperative Respiratory: Respirations even and unlabored. Lungs clear to auscultation bilaterally.   No wheezes, crackles, or rhonchi.  Cardiovascular: Normal S1, S2. No MRG. Regular rate and rhythm. No peripheral edema, cyanosis or pallor.  Gastrointestinal:  Soft, nondistended, nontender. No rebound or guarding. Normal bowel sounds. No appreciable masses or hepatomegaly. Rectal:  Not performed.  Psychiatric: Demonstrates good judgement and reason without abnormal affect or behaviors.  No recent labs or imaging.  Assessment: 1.  Iron deficiency anemia: Colonoscopy in November 2017 with tubular adenomas, repeat recommended in 3 years, now patient with a new anemia, trying to be approved for upcoming knee surgery scheduled for the 28th of this month, history of NSAID use recently, no GI complaints; Consider GI source of blood loss versus  other  Plan: 1.  Recommend the patient start Ferrous sulfate 325 mg daily 2.  Discussed that iron can cause constipation.  If this occurs patient can use a stool softener and/or MiraLAX. 3.  Patient was scheduled for an EGD and Colonoscopy with Dr. Carlean Purl as he had availability sooner than Dr. Ardis Hughs.  Did discuss risk, benefits, limitations and alternatives the patient agrees to proceed.  After time of procedure patient will continue to follow with her primary GI provider Dr. Ardis Hughs. 4.  Patient to follow in clinic per recommendations from Dr. Carlean Purl after time of procedures.  Ellouise Newer, PA-C [facility] 05/28/2018, 2:13 PM  Cc: Prince Solian, MD

## 2018-05-30 ENCOUNTER — Encounter: Payer: Self-pay | Admitting: Gastroenterology

## 2018-06-06 ENCOUNTER — Telehealth: Payer: Self-pay | Admitting: Internal Medicine

## 2018-06-07 ENCOUNTER — Encounter: Payer: Self-pay | Admitting: Internal Medicine

## 2018-06-07 ENCOUNTER — Ambulatory Visit (AMBULATORY_SURGERY_CENTER): Payer: Medicare HMO | Admitting: Internal Medicine

## 2018-06-07 VITALS — BP 159/66 | HR 73 | Temp 99.1°F | Resp 26 | Ht 64.5 in | Wt 227.0 lb

## 2018-06-07 DIAGNOSIS — G4733 Obstructive sleep apnea (adult) (pediatric): Secondary | ICD-10-CM | POA: Diagnosis not present

## 2018-06-07 DIAGNOSIS — D508 Other iron deficiency anemias: Secondary | ICD-10-CM | POA: Diagnosis not present

## 2018-06-07 DIAGNOSIS — Z86010 Personal history of colonic polyps: Secondary | ICD-10-CM

## 2018-06-07 DIAGNOSIS — E119 Type 2 diabetes mellitus without complications: Secondary | ICD-10-CM | POA: Diagnosis not present

## 2018-06-07 DIAGNOSIS — D509 Iron deficiency anemia, unspecified: Secondary | ICD-10-CM | POA: Diagnosis not present

## 2018-06-07 DIAGNOSIS — K317 Polyp of stomach and duodenum: Secondary | ICD-10-CM | POA: Diagnosis not present

## 2018-06-07 MED ORDER — SODIUM CHLORIDE 0.9 % IV SOLN: 500.0000 mL | Freq: Once | INTRAVENOUS | Status: DC

## 2018-06-07 NOTE — Op Note (Signed)
[facility] Patient Name: Janice [name] Procedure Date: 06/07/2018 3:15 PM MRN: [id number] Endoscopist: Gatha Mayer , MD Age: 67 Referring MD:  Date of Birth: 03/30/1951 Gender: Female Account #: [id number] Procedure:                Colonoscopy Indications:              Iron deficiency anemia Medicines:                Propofol per Anesthesia, Monitored Anesthesia Care Procedure:                Pre-Anesthesia Assessment:                           - Prior to the procedure, a History and Physical                            was performed, and patient medications and                            allergies were reviewed. The patient's tolerance of                            previous anesthesia was also reviewed. The risks                            and benefits of the procedure and the sedation                            options and risks were discussed with the patient.                            All questions were answered, and informed consent                            was obtained. Prior Anticoagulants: The patient has                            taken no previous anticoagulant or antiplatelet                            agents. ASA Grade Assessment: III - A patient with                            severe systemic disease. After reviewing the risks                            and benefits, the patient was deemed in                            satisfactory condition to undergo the procedure.                           After obtaining informed consent, the colonoscope  was passed under direct vision. Throughout the                            procedure, the patient's blood pressure, pulse, and                            oxygen saturations were monitored continuously. The                            Colonoscope was introduced through the anus and                            advanced to the the terminal ileum, with                            identification of the  appendiceal orifice and IC                            valve. The colonoscopy was performed without                            difficulty. The patient tolerated the procedure                            well. The quality of the bowel preparation was                            good. The terminal ileum, ileocecal valve,                            appendiceal orifice, and rectum were photographed. Scope In: 3:33:42 PM Scope Out: 3:46:28 PM Scope Withdrawal Time: 0 hours 9 minutes 53 seconds  Total Procedure Duration: 0 hours 12 minutes 46 seconds  Findings:                 The perianal and digital rectal examinations were                            normal.                           Multiple diverticula were found in the sigmoid                            colon.                           The terminal ileum appeared normal.                           The exam was otherwise without abnormality on                            direct and retroflexion views. Complications:            No immediate complications. Estimated Blood Loss:  Estimated blood loss: none. Impression:               - Diverticulosis in the sigmoid colon.                           - The examined portion of the ileum was normal.                           - The examination was otherwise normal on direct                            and retroflexion views.                           - No specimens collected.                           - Personal history of colonic polyps. Recommendation:           - Patient has a contact number available for                            emergencies. The signs and symptoms of potential                            delayed complications were discussed with the                            patient. Return to normal activities tomorrow.                            Written discharge instructions were provided to the                            patient.                           - Resume previous diet.                            - Continue present medications.                           - No aspirin, ibuprofen, naproxen, or other                            non-steroidal anti-inflammatory drugs for 2 weeks                            after polyp removal. GASTRIC POLYP WAS REMOVED -                            SUSPECT SHE WAS LEAKING BLOOD FROM FRIABLE GASTRIC                            POLYP                           -  Repeat colonoscopy in 5 years for surveillance.                            DR. Ardis Hughs - HE IS PRIMARY GI MD - SHE HAD 3                            ADENOMAS IN 2017 SO RECALL 5 YEARS FROM NOW Gatha Mayer, MD 06/07/2018 4:02:05 PM This report has been signed electronically.

## 2018-06-07 NOTE — Op Note (Signed)
[facility] Patient Name: Janice [name] Procedure Date: 06/07/2018 3:16 PM MRN: [id number] Endoscopist: Gatha Mayer , MD Age: 67 Referring MD:  Date of Birth: [birth year] Gender: Female Account #: [id number] Procedure:                Upper GI endoscopy Indications:              Iron deficiency anemia Medicines:                Propofol per Anesthesia, Monitored Anesthesia Care Procedure:                Pre-Anesthesia Assessment:                           - Prior to the procedure, a History and Physical                            was performed, and patient medications and                            allergies were reviewed. The patient's tolerance of                            previous anesthesia was also reviewed. The risks                            and benefits of the procedure and the sedation                            options and risks were discussed with the patient.                            All questions were answered, and informed consent                            was obtained. Prior Anticoagulants: The patient has                            taken no previous anticoagulant or antiplatelet                            agents. ASA Grade Assessment: III - A patient with                            severe systemic disease. After reviewing the risks                            and benefits, the patient was deemed in                            satisfactory condition to undergo the procedure.                           After obtaining informed consent, the endoscope was  passed under direct vision. Throughout the                            procedure, the patient's blood pressure, pulse, and                            oxygen saturations were monitored continuously. The                            Model GIF-HQ190 [id number]) scope was introduced                            through the mouth, and advanced to the second part                            of  duodenum. The upper GI endoscopy was                            accomplished without difficulty. The patient                            tolerated the procedure well. Scope In: Scope Out: Findings:                 A single 10 mm semi-sessile polyp with no bleeding                            and stigmata of recent bleeding was found in the                            prepyloric region of the stomach. The polyp was                            removed with a hot snare. Resection and retrieval                            were complete. Verification of patient                            identification for the specimen was done. Estimated                            blood loss was minimal. To prevent bleeding after                            the polypectomy, two hemostatic clips were                            successfully placed (MR conditional). There was no                            bleeding at the end of the procedure.  The exam was otherwise without abnormality.                           The cardia and gastric fundus were normal on                            retroflexion. Complications:            No immediate complications. Estimated Blood Loss:     Estimated blood loss: none. Impression:               - A single gastric polyp. Resected and retrieved.                            Clips (MR conditional) were placed.                           - The examination was otherwise normal. Recommendation:           - Patient has a contact number available for                            emergencies. The signs and symptoms of potential                            delayed complications were discussed with the                            patient. Return to normal activities tomorrow.                            Written discharge instructions were provided to the                            patient.                           - Resume previous diet.                           - No  aspirin, ibuprofen, naproxen, or other                            non-steroidal anti-inflammatory drugs for 2 weeks                            after polyp removal.                           - Await pathology results.                           - See the other procedure note for documentation of                            additional recommendations. Gatha Mayer, MD 06/07/2018 3:58:43 PM This report has been signed electronically.

## 2018-06-07 NOTE — Progress Notes (Signed)
Called to room to assist during endoscopic procedure.  Patient ID and intended procedure confirmed with present staff. Received instructions for my participation in the procedure from the performing physician.  

## 2018-06-07 NOTE — Patient Instructions (Addendum)
There was an inflamed polyp in the stomach that I think was leaking blood. I removed it. Do not think it was pre-cancerous or cancer but we will make sure.  The colonoscopy was ok - diverticulosis but no polyps or cancer.  Stay on the iron supplements. See Dr. Dagmar Hait about following up the anemia.  Avoid taking ibuprofen, advil, aleve or aspirin by mouth right now but could take again in 2 weeks. Tylenol is ok.  I appreciate the opportunity to care for you. Gatha Mayer, MD, FACG  YOU HAD AN ENDOSCOPIC PROCEDURE TODAY AT [facility]:   Refer to the procedure report that was given to you for any specific questions about what was found during the examination.  If the procedure report does not answer your questions, please call your gastroenterologist to clarify.  If you requested that your care partner not be given the details of your procedure findings, then the procedure report has been included in a sealed envelope for you to review at your convenience later.  YOU SHOULD EXPECT: Some feelings of bloating in the abdomen. Passage of more gas than usual.  Walking can help get rid of the air that was put into your GI tract during the procedure and reduce the bloating. If you had a lower endoscopy (such as a colonoscopy or flexible sigmoidoscopy) you may notice spotting of blood in your stool or on the toilet paper. If you underwent a bowel prep for your procedure, you may not have a normal bowel movement for a few days.  Please Note:  You might notice some irritation and congestion in your nose or some drainage.  This is from the oxygen used during your procedure.  There is no need for concern and it should clear up in a day or so.  SYMPTOMS TO REPORT IMMEDIATELY:   Following lower endoscopy (colonoscopy or flexible sigmoidoscopy):  Excessive amounts of blood in the stool  Significant tenderness or worsening of abdominal pains  Swelling of the abdomen that is new,  acute  Fever of 100F or higher   Following upper endoscopy (EGD)  Vomiting of blood or coffee ground material  New chest pain or pain under the shoulder blades  Painful or persistently difficult swallowing  New shortness of breath  Fever of 100F or higher  Black, tarry-looking stools  For urgent or emergent issues, a gastroenterologist can be reached at any hour by calling [phone].   DIET:  We do recommend a small meal at first, but then you may proceed to your regular diet.  Drink plenty of fluids but you should avoid alcoholic beverages for 24 hours.  ACTIVITY:  You should plan to take it easy for the rest of today and you should NOT DRIVE or use heavy machinery until tomorrow (because of the sedation medicines used during the test).    FOLLOW UP: Our staff will call the number listed on your records the next business day following your procedure to check on you and address any questions or concerns that you may have regarding the information given to you following your procedure. If we do not reach you, we will leave a message.  However, if you are feeling well and you are not experiencing any problems, there is no need to return our call.  We will assume that you have returned to your regular daily activities without incident.  If any biopsies were taken you will be contacted by phone or by letter within  the next 1-3 weeks.  Please call us at [phone] if you have not heard about the biopsies in 3 weeks.    SIGNATURES/CONFIDENTIALITY: You and/or your care partner have signed paperwork which will be entered into your electronic medical record.  These signatures attest to the fact that that the information above on your After Visit Summary has been reviewed and is understood.  Full responsibility of the confidentiality of this discharge information lies with you and/or your care-partner.

## 2018-06-08 ENCOUNTER — Telehealth: Payer: Self-pay | Admitting: *Deleted

## 2018-06-08 DIAGNOSIS — R05 Cough: Secondary | ICD-10-CM | POA: Diagnosis not present

## 2018-06-08 DIAGNOSIS — Z6841 Body Mass Index (BMI) 40.0 and over, adult: Secondary | ICD-10-CM | POA: Diagnosis not present

## 2018-06-08 DIAGNOSIS — J329 Chronic sinusitis, unspecified: Secondary | ICD-10-CM | POA: Diagnosis not present

## 2018-06-08 NOTE — Telephone Encounter (Signed)
  Follow up Call-  Call back number 06/07/2018 08/01/2016  Post procedure Call Back phone  # [phone]  Permission to leave phone message Yes Yes  Some recent data might be hidden     Patient questions:  Do you have a fever, pain , or abdominal swelling? No. Pain Score  0 *  Have you tolerated food without any problems? Yes.    Have you been able to return to your normal activities? Yes.    Do you have any questions about your discharge instructions: Diet   No. Medications  No. Follow up visit  No.  Do you have questions or concerns about your Care? No.  Actions: * If pain score is 4 or above: No action needed, pain <4.

## 2018-06-08 NOTE — Telephone Encounter (Signed)
  Follow up Call-  Call back number 06/07/2018 08/01/2016  Post procedure Call Back phone  # [phone]  Permission to leave phone message Yes Yes  Some recent data might be hidden     Patient questions:  Unable to leave message.  Voice mail is not working properly.

## 2018-06-14 ENCOUNTER — Encounter: Payer: Self-pay | Admitting: Internal Medicine

## 2018-06-14 NOTE — Progress Notes (Signed)
Hyperplastic gastric polyp No recall

## 2018-06-27 DIAGNOSIS — D509 Iron deficiency anemia, unspecified: Secondary | ICD-10-CM | POA: Diagnosis not present

## 2018-06-27 DIAGNOSIS — J45909 Unspecified asthma, uncomplicated: Secondary | ICD-10-CM | POA: Diagnosis not present

## 2018-06-27 DIAGNOSIS — Z6839 Body mass index (BMI) 39.0-39.9, adult: Secondary | ICD-10-CM | POA: Diagnosis not present

## 2018-06-27 DIAGNOSIS — J329 Chronic sinusitis, unspecified: Secondary | ICD-10-CM | POA: Diagnosis not present

## 2018-06-28 DIAGNOSIS — D509 Iron deficiency anemia, unspecified: Secondary | ICD-10-CM | POA: Diagnosis not present

## 2018-06-28 DIAGNOSIS — J329 Chronic sinusitis, unspecified: Secondary | ICD-10-CM | POA: Diagnosis not present

## 2018-06-28 DIAGNOSIS — R05 Cough: Secondary | ICD-10-CM | POA: Diagnosis not present

## 2018-06-28 DIAGNOSIS — J45909 Unspecified asthma, uncomplicated: Secondary | ICD-10-CM | POA: Diagnosis not present

## 2018-07-05 ENCOUNTER — Encounter (HOSPITAL_COMMUNITY): Payer: Medicare HMO

## 2018-07-13 NOTE — Telephone Encounter (Signed)
Patient has questions regarding meds before procedure tomorrow

## 2018-07-26 DIAGNOSIS — G4733 Obstructive sleep apnea (adult) (pediatric): Secondary | ICD-10-CM | POA: Diagnosis not present

## 2018-08-06 DIAGNOSIS — D649 Anemia, unspecified: Secondary | ICD-10-CM | POA: Diagnosis not present

## 2018-08-13 ENCOUNTER — Telehealth: Payer: Self-pay | Admitting: Internal Medicine

## 2018-08-13 NOTE — Telephone Encounter (Signed)
Patient was evaluated by Ellouise Newer, PA for anemia. She is requesting surgical clearance for knee surgery.  She had labs done last week by Dr. Dagmar Hait.  She is asked to contact Dr. Danna Hefty office and have the labs sent to [facility].  She need surgical clearance from [facility] as well.  She is asked to contact them to have the release faxed to attention Ellouise Newer, PA

## 2018-08-14 ENCOUNTER — Encounter: Payer: Self-pay | Admitting: Internal Medicine

## 2018-08-14 ENCOUNTER — Telehealth: Payer: Self-pay | Admitting: Internal Medicine

## 2018-08-14 NOTE — Telephone Encounter (Signed)
Please send copy of this to Drs Dagmar Hait, and [facility] re:  Clearance for orthopedic surgery   I think it is ok to proceed with surgery regarding her iron-deficiency anemia but recommend she have a pre-op CBC (another one) and if platelets remain low would need to have a hematology evaluation to reduce chances of excessive bleeding from the surgery.

## 2018-08-14 NOTE — Telephone Encounter (Signed)
This encounter was created in error - please disregard.

## 2018-08-14 NOTE — Telephone Encounter (Signed)
I left a detailed message for the patient.  Records faxed to [phone] Dr. Dagmar Hait and [place]

## 2018-08-23 ENCOUNTER — Ambulatory Visit: Payer: Medicare HMO | Admitting: Neurology

## 2018-08-24 NOTE — Progress Notes (Signed)
[facility]  PATIENT: Janice [name] DOB: [birth year]   REASON FOR VISIT: Follow up for OSA with CPAP HISTORY FROM: Patient    HISTORY OF PRESENT ILLNESS:UPDATE 12/16/2019CM Janice [name], 67 year old female returns for follow-up with a history of obstructive sleep apnea here for CPAP compliance.  She has had no problems with her machine.  Compliance data dated 07/28/2018-08/26/2018 shows compliance greater than 4 hours at 100%.  Average usage 7 hours 34 minutes.  Set pressure 5 to 20 cm.  EPR level 3 leak 95th percentile 0.3.  AHI 0.6 ESS 0 she returns for reevaluation  Janice [name] is a 67 y.o. female , seen here in a RV:  05-25-2017, the patient was issued a new CPAP after a HST confirmed the presence of OSA: She is a compliant user now, but she experienced bronchitis, Pneumonia and constant non- productive cough at night. Spasms of coughing, after 2 antibiotic rounds.  She has been prescribed a narcotic cough syrup with codeine and tried to discontinue this medication but couldn't. Just coughing too much still at night to get restful sleep, just last night took some cough syrup to allow her to sleep. She reports that the CPAP is not pleasant and comfortable to wear while she has sees respiratory tract infections which may partially be viral and partially be allergic in origin. I would like for her to start to take a daily Claritin or Allegra, nondrowsy making to at least eliminate the possibility of allergies playing a role. She gets choked during the day, too. No sleep related, not even food intake related. Epiglottis spasm?  I will give her the medical leave from CPAP use for the next 7 days until she has completely recovered. She should then start CPAP again.  REVIEW OF SYSTEMS: Full 14 system review of systems performed and notable only for those listed, all others are neg:  Constitutional: neg  Cardiovascular: neg Ear/Nose/Throat: neg  Skin: neg Eyes:  neg Respiratory: neg Gastroitestinal: neg  Hematology/Lymphatic: neg  Endocrine: neg Musculoskeletal:neg Allergy/Immunology: neg Neurological: neg Psychiatric: neg Sleep : Obstructive sleep apnea with CPAP   ALLERGIES: Allergies  Allergen Reactions  . Iodine     And Betadine/caused hives and swelling  . Latex Hives    Burning, itching,rash  . Tessalon Perles [Benzonatate] Anaphylaxis  . Zocor [Simvastatin]     Caused neuropathy in arms  . Phenergan [Promethazine]     Caused more nausea  . Wellbutrin [Bupropion]     Negative thoughts.  . Amoxicillin Hives    blisters  . Diflucan [Fluconazole]     Hives, blister  . Doxycycline     Itching  . Levofloxacin     Joint aches//leg, shoulder pain    HOME MEDICATIONS: Outpatient Medications Prior to Visit  Medication Sig Dispense Refill  . acetaminophen (TYLENOL) 500 MG tablet Take 500 mg by mouth every 6 (six) hours as needed.      . AMBULATORY NON FORMULARY MEDICATION Medication Name: *Equate Brand-Non preservative Eye drops for Dry Eye-1 drop each eye daily    . amLODipine (NORVASC) 10 MG tablet Take 10 mg by mouth daily.    . Calcium Carb-Cholecalciferol (CALCIUM 1000 + D PO) Take by mouth.      . cetirizine (KLS ALLER-TEC) 10 MG tablet Take 10 mg by mouth daily.    Marland Kitchen desoximetasone (TOPICORT) 0.25 % cream Apply 1 application topically. Use on rash 6 times daily    . diclofenac sodium (VOLTAREN) 1 % GEL  APPLY 2 TO 4 GRAMS TO THE AFFECTED AREA(S) TWICE DAILY AS NEEDED  2  . ferrous sulfate 325 (65 FE) MG tablet Take 1 tablet (325 mg total) by mouth daily with breakfast. (Patient taking differently: Take 325 mg by mouth 3 (three) times daily with meals. ) 30 tablet 3  . Flaxseed, Linseed, OIL Take by mouth. daily    . fluticasone (VERAMYST) 27.5 MCG/SPRAY nasal spray Place 2 sprays into the nose daily.     Marland Kitchen gabapentin (NEURONTIN) 300 MG capsule Take by mouth. Take  2 pill at supper, 1 at bedtime.    . insulin NPH Human  (HUMULIN N,NOVOLIN N) 100 UNIT/ML injection Inject 75 units at breakfast, 40 units at lunch and 65 units at supper    . insulin regular (NOVOLIN R,HUMULIN R) 100 units/mL injection Inject 30 units breakfast, 30 units lunch, 30 units at supper    . irbesartan (AVAPRO) 300 MG tablet Take 300 mg by mouth daily.     . metFORMIN (GLUCOPHAGE XR) 500 MG 24 hr tablet Take 1,000 mg by mouth 2 (two) times daily.     Marland Kitchen nystatin-triamcinolone (MYCOLOG II) cream Apply 1 application topically as needed.    Marland Kitchen omeprazole (PRILOSEC) 40 MG capsule Take 40 mg by mouth daily. 1 pm    . PARoxetine (PAXIL) 20 MG tablet Take 10 mg by mouth daily with supper.     . terconazole (TERAZOL 3) 0.8 % vaginal cream Place 1 applicator vaginally 3 times/day as needed-between meals & bedtime.    . vitamin C (ASCORBIC ACID) 500 MG tablet Take 500 mg by mouth. Take one pill 4 times a week    . ibuprofen (ADVIL,MOTRIN) 200 MG tablet Take 200 mg by mouth every 6 (six) hours as needed.    Marland Kitchen terconazole (TERAZOL 7) 0.4 % vaginal cream Place 1 applicator vaginally as needed.     No facility-administered medications prior to visit.     PAST MEDICAL HISTORY: Past Medical History:  Diagnosis Date  . Allergy   . Anemia   . Asthma    seasonal  . Common migraine   . Complication of anesthesia    Per pt/had breathing problems with "block" during rotator cuff surgery.  . DDD (degenerative disc disease)   . Depression    denie.  . Diabetes mellitus   . Diabetic peripheral neuropathy ([place])   . Diverticulitis   . Diverticulosis   . DJD (degenerative joint disease)   . Gastric polyp 05/2018   hyperplastic - removed  . GERD (gastroesophageal reflux disease)   . History of colon polyps    hyperplastic  . Hyperlipidemia   . Hypertension   . Obesity   . Sleep apnea    cpap  . Sleep apnea, obstructive     PAST SURGICAL HISTORY: Past Surgical History:  Procedure Laterality Date  . ANKLE SURGERY     Left   . COLONOSCOPY     . ROTATOR CUFF REPAIR Bilateral 2012, 2015  . TONSILLECTOMY      FAMILY HISTORY: Family History  Problem Relation Age of Onset  . Breast cancer Mother   . Bone cancer Mother   . Heart failure Father   . Colon cancer Neg Hx   . Rectal cancer Neg Hx   . Stomach cancer Neg Hx   . Esophageal cancer Neg Hx   . Pancreatic cancer Neg Hx   . Liver disease Neg Hx     SOCIAL HISTORY: Social History  Socioeconomic History  . Marital status: Married    Spouse name: Not on file  . Number of children: 0  . Years of education: 3 years college  . Highest education level: Not on file  Occupational History  . Occupation: Unemployed  Social Needs  . Financial resource strain: Not on file  . Food insecurity:    Worry: Not on file    Inability: Not on file  . Transportation needs:    Medical: Not on file    Non-medical: Not on file  Tobacco Use  . Smoking status: Never Smoker  . Smokeless tobacco: Never Used  Substance and Sexual Activity  . Alcohol use: Yes    Comment: very rare  . Drug use: No  . Sexual activity: Never    Birth control/protection: None  Lifestyle  . Physical activity:    Days per week: Not on file    Minutes per session: Not on file  . Stress: Not on file  Relationships  . Social connections:    Talks on phone: Not on file    Gets together: Not on file    Attends religious service: Not on file    Active member of club or organization: Not on file    Attends meetings of clubs or organizations: Not on file    Relationship status: Not on file  . Intimate partner violence:    Fear of current or ex partner: Not on file    Emotionally abused: Not on file    Physically abused: Not on file    Forced sexual activity: Not on file  Other Topics Concern  . Not on file  Social History Narrative   1 caffeine drink daily    Right-handed   Lives at home with husband.     PHYSICAL EXAM  Vitals:   08/27/18 0933  BP: 137/60  Pulse: 77  Weight: 233 lb 6.4  oz (105.9 kg)  Height: '5\' 1"'$  (1.549 m)   Body mass index is 44.1 kg/m.  Generalized: Well developed, morbidly obese female in no acute distress  Head: normocephalic and atraumatic,. Oropharynx benign mallopatti 3 Neck: Supple, circumference 19 Lungs clear Musculoskeletal: No deformity  Skin no rash or edema Neurological examination   Mentation: Alert oriented to time, place, history taking. Attention span and concentration appropriate. Recent and remote memory intact.  Follows all commands speech and language fluent. ESS 0  Cranial nerve II-XII: Pupils were equal round reactive to light extraocular movements were full, visual field were full on confrontational test. Facial sensation and strength were normal. hearing was intact to finger rubbing bilaterally. Uvula tongue midline. head turning and shoulder shrug were normal and symmetric.Tongue protrusion into cheek strength was normal. Motor: normal bulk and tone, full strength in the BUE, BLE,  Sensory: normal and symmetric to light touch,  Coordination: finger-nose-finger, heel-to-shin bilaterally, no dysmetria Gait and Station: Rising up from seated position without assistance, normal stance,  moderate stride, good arm swing, smooth turning, ambulates with single-point cane due to left knee pain DIAGNOSTIC DATA (LABS, IMAGING, TESTING) - I reviewed patient records, labs, notes, testing and imaging myself where available.  Lab Results  Component Value Date   WBC 5.4 12/07/2013   HGB 12.2 12/07/2013   HCT 39.5 12/07/2013   MCV 88.8 12/07/2013   PLT 120.0 (L) 04/23/2012      Component Value Date/Time   NA 139 04/23/2012 1457   K 4.7 04/23/2012 1457   CL 100 04/23/2012 1457   CO2  29 04/23/2012 1457   GLUCOSE 163 (H) 04/23/2012 1457   BUN 11 04/23/2012 1457   CREATININE 0.6 04/23/2012 1457   CALCIUM 9.4 04/23/2012 1457   PROT 6.5 01/29/2011 1800   ALBUMIN 4.3 01/29/2011 1800   AST 36 01/29/2011 1800   ALT 56 (H) 01/29/2011  1800   ALKPHOS 74 01/29/2011 1800   BILITOT 0.3 01/29/2011 1800   GFRNONAA >60 01/30/2011 0630   GFRAA  01/30/2011 0630    >60        The eGFR has been calculated using the MDRD equation. This calculation has not been validated in all clinical situations. eGFR's persistently <60 mL/min signify possible Chronic Kidney Disease.    ASSESSMENT AND PLAN  67 y.o. year old female  has a past medical history of Anemia, Asthma,  DDD (degenerative disc disease), Depression, Diabetes mellitus, Diabetic peripheral neuropathy ([place]), Diverticulitis, Diverticulosis, DJD (degenerative joint disease),  GERD (gastroesophageal reflux disease), History of colon polyps, Hyperlipidemia, Hypertension, Obesity, and Sleep apnea, obstructive. here to follow-up for her sleep apnea.Compliance data dated 07/28/2018-08/26/2018 shows compliance greater than 4 hours at 100%.  Average usage 7 hours 34 minutes.  Set pressure 5 to 20 cm.  EPR level 3 leak 95th percentile 0.3.  AHI 0.6 ESS 0   PLAN: CPAP compliance 100% Continue same settings Follow-up yearly Dennie Bible, [facility], [facility], APRN  [facility] [address] [place], [place] [address] [phone]

## 2018-08-26 ENCOUNTER — Encounter: Payer: Self-pay | Admitting: Nurse Practitioner

## 2018-08-27 ENCOUNTER — Encounter: Payer: Self-pay | Admitting: Nurse Practitioner

## 2018-08-27 ENCOUNTER — Ambulatory Visit: Payer: Medicare HMO | Admitting: Nurse Practitioner

## 2018-08-27 DIAGNOSIS — G4733 Obstructive sleep apnea (adult) (pediatric): Secondary | ICD-10-CM

## 2018-08-27 DIAGNOSIS — Z9989 Dependence on other enabling machines and devices: Secondary | ICD-10-CM | POA: Diagnosis not present

## 2018-08-27 NOTE — Patient Instructions (Signed)
CPAP compliance 100% Continue same settings Follow-up yearly  

## 2018-08-29 NOTE — H&P (Signed)
Patient name Janice [name], Sanks DICTATION# [id number] CSN# [id number]  Arvella Nigh, MD 08/29/2018 5:22 AM

## 2018-08-30 ENCOUNTER — Encounter (HOSPITAL_BASED_OUTPATIENT_CLINIC_OR_DEPARTMENT_OTHER): Payer: Self-pay

## 2018-08-30 ENCOUNTER — Other Ambulatory Visit: Payer: Self-pay

## 2018-08-30 NOTE — H&P (Signed)
NAME: Janice [name], Janice [name] [facility] MEDICAL RECORD TD:[id number] ACCOUNT [id number] DATE OF BIRTH:1950-10-21 FACILITY: [facility] LOCATION:  PHYSICIAN:Eulan Heyward Sherran Needs, MD  HISTORY AND PHYSICAL  DATE OF ADMISSION:  09/14/2018  HISTORY OF PRESENT ILLNESS:  The patient is a 67 year old postmenopausal patient who presents for LEEP or cold knife conization of the cervix.  It was noted on exam that she had an endocervical lesion.  We did several biopsies.  Pathology revealed low  grade dysplasia.  We attempted to do a LEEP in the office, but due to severe obesity, we could not get that done even with leg repositioning.  She now presents for this to be done under sedation or general anesthesia here in the hospital.  ALLERGIES:  SHE IS ALLERGIC TO LATEX, BETADINE, IODINE, DOXYCYCLINE, LEVOFLOXACIN, PHENERGAN, TESSALON PERLES, WELLBUTRIN, AND ZOCOR.  MEDICATIONS:  She is on Novolin R insulin, Novolin 75, metformin 500 mg twice a day, amlodipine 10 mg a day, omeprazole 40 mg a day, Paxil 10 mg, ____ 300 mg a day, gabapentin, terconazole, Ventolin inhaler, fluconazole and diclofenac.    PAST MEDICAL HISTORY:  The patient has a history of hypertension, diabetes, arthritis as well as asthma.  PAST SURGICAL HISTORY:  The patient did have a hysteroscopic evaluation, resection of endometrial polyp and uterine curetting in 1997.  She has had a tonsillectomy.  She has had no prior pregnancy.  FAMILY HISTORY:  Mother with a history of breast cancer.  She has 2 maternal aunts with breast cancer.  Father with a history of diabetes.  SOCIAL HISTORY:  No tobacco or alcohol use.  REVIEW OF SYSTEMS:  Noncontributory.  PHYSICAL EXAMINATION: VITAL SIGNS:  Afebrile, stable vital signs. HEENT:  The patient is normocephalic.  Pupils are equal, round, reactive to light and accommodation.  Extraocular movements are intact. LUNGS:  Clear. HEART:  Regular rhythm and rate without murmurs or gallops. ABDOMEN:  Benign.  No masses,  organomegaly or tenderness. PELVIC:  Normal external genitalia.  Vaginal mucosa is clear.  Cervix she has what looks like an endocervical polyp.  Uterus normal size and shape.  Adnexa unremarkable. EXTREMITIES:  Trace edema. NEUROLOGIC:  Grossly normal limits.  ASSESSMENT: 1.  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 2.  Diabetes. 3.  Hypertension.  PLAN:  The patient to undergo attempted LEEP of the cervix under general or major sedation or possibly a cold knife conization of the cervix.  The nature of the procedure has been discussed.  The risks have been complaining including the risk of  infection, the risk of hemorrhage that could require transfusion with the risk of AIDS or hepatitis, excessive bleeding could require hysterectomy, risk of injury to adjacent organs such as bladder or bowel, risk of deep venous thrombosis and pulmonary  embolus.  The patient expressed understanding of potential risks, complications.  TN/NUANCE  D:08/29/2018 T:08/29/2018 JOB:[id number]

## 2018-08-30 NOTE — Progress Notes (Signed)
Spoke with: Clarise Cruz NPO:  After Midnight, no gum, candy, or mints   Arrival time: 0530AM Labs: (CBC, BMP, T&S 08/31/2018 in epic) requested EKG from Dr. Danna Hefty office AM medications: Amlodipine, Pre surgery Ensure Pre op orders: Yes Ride home:  Bobby Rumpf (husband) [phone] Asked to bring CPAP machine day of surgery

## 2018-08-30 NOTE — Progress Notes (Signed)
Received EKG 05/2018 from Dr. Danna Hefty office place in chart.

## 2018-08-31 ENCOUNTER — Encounter (HOSPITAL_COMMUNITY)
Admission: RE | Admit: 2018-08-31 | Discharge: 2018-08-31 | Disposition: A | Discharge status: Home / Self Care | Payer: Medicare HMO | Source: Ambulatory Visit | Attending: Obstetrics and Gynecology | Admitting: Obstetrics and Gynecology

## 2018-08-31 DIAGNOSIS — Z01812 Encounter for preprocedural laboratory examination: Secondary | ICD-10-CM | POA: Diagnosis not present

## 2018-08-31 LAB — BASIC METABOLIC PANEL
Anion gap: 12 (ref 5–15)
BUN: 9 mg/dL (ref 8–23)
CO2: 25 mmol/L (ref 22–32)
Calcium: 9.4 mg/dL (ref 8.9–10.3)
Chloride: 105 mmol/L (ref 98–111)
Creatinine, Ser: 0.66 mg/dL (ref 0.44–1.00)
GFR calc Af Amer: >60 mL/min (ref >60)
GFR calc non Af Amer: >60 mL/min (ref >60)
Glucose, Bld: 64 mg/dL — ABNORMAL LOW (ref 70–99)
Potassium: 4.1 mmol/L (ref 3.5–5.1)
Sodium: 142 mmol/L (ref 135–145)

## 2018-08-31 LAB — CBC
HCT: 45.5 % (ref 36.0–46.0)
Hemoglobin: 13.6 g/dL (ref 12.0–15.0)
MCH: 26.5 pg (ref 26.0–34.0)
MCHC: 29.9 g/dL — ABNORMAL LOW (ref 30.0–36.0)
MCV: 88.7 fL (ref 80.0–100.0)
Platelets: 116 10*3/uL — ABNORMAL LOW (ref 150–400)
RBC: 5.13 MIL/uL — ABNORMAL HIGH (ref 3.87–5.11)
RDW: 17.6 % — ABNORMAL HIGH (ref 11.5–15.5)
WBC: 9.6 10*3/uL (ref 4.0–10.5)
nRBC: 0 % (ref 0.0–0.2)

## 2018-08-31 NOTE — Progress Notes (Signed)
SPOKE WITH PATIENT BY PHONE AND PATIENT FEELS OK BLOOD GLUCOSE WAS 67 AT [facility] TODAY. ROUTED CBC AND BMET LABS FROM PRE OP LAB TODAY TO DR Arvella Nigh AND LEFT MESSAGE FOR HEATHER CALHOUN ABOUT CBC AND BMET RESULTS FAXED TO DR [facility]

## 2018-08-31 NOTE — Progress Notes (Signed)
TYPE AND SCREEN TOO FAR OUT FROM SURGERY DATE TO BE DRAWN 09-14-17.

## 2018-09-13 NOTE — Anesthesia Preprocedure Evaluation (Addendum)
Anesthesia Evaluation  Patient identified by MRN, date of birth, ID band Patient awake    Reviewed: Allergy & Precautions, NPO status , Patient's Chart, lab work & pertinent test results  Airway Mallampati: II  TM Distance: >3 FB Neck ROM: Full    Dental  (+) Dental Advisory Given   Pulmonary asthma , sleep apnea ,    breath sounds clear to auscultation       Cardiovascular hypertension, Pt. on medications  Rhythm:Regular Rate:Normal     Neuro/Psych  Headaches,    GI/Hepatic Neg liver ROS, GERD  ,  Endo/Other  diabetes, Type 2, Insulin DependentMorbid obesity  Renal/GU negative Renal ROS     Musculoskeletal  (+) Arthritis ,   Abdominal   Peds  Hematology negative hematology ROS (+)   Anesthesia Other Findings   Reproductive/Obstetrics                            Lab Results  Component Value Date   WBC 9.6 08/31/2018   HGB 13.6 08/31/2018   HCT 45.5 08/31/2018   MCV 88.7 08/31/2018   PLT 116 (L) 08/31/2018   Lab Results  Component Value Date   CREATININE 0.66 08/31/2018   BUN 9 08/31/2018   NA 142 08/31/2018   K 4.1 08/31/2018   CL 105 08/31/2018   CO2 25 08/31/2018    Anesthesia Physical Anesthesia Plan  ASA: III  Anesthesia Plan: General   Post-op Pain Management:    Induction: Intravenous  PONV Risk Score and Plan: 3 and Dexamethasone, Ondansetron and Treatment may vary due to age or medical condition  Airway Management Planned: LMA  Additional Equipment:   Intra-op Plan:   Post-operative Plan: Extubation in OR  Informed Consent: I have reviewed the patients History and Physical, chart, labs and discussed the procedure including the risks, benefits and alternatives for the proposed anesthesia with the patient or authorized representative who has indicated his/her understanding and acceptance.   Dental advisory given  Plan Discussed with: CRNA  Anesthesia  Plan Comments:        Anesthesia Quick Evaluation

## 2018-09-14 ENCOUNTER — Encounter (HOSPITAL_BASED_OUTPATIENT_CLINIC_OR_DEPARTMENT_OTHER)
Admission: RE | Disposition: A | Discharge status: Home / Self Care | Payer: Self-pay | Source: Ambulatory Visit | Attending: Obstetrics and Gynecology

## 2018-09-14 ENCOUNTER — Ambulatory Visit (HOSPITAL_BASED_OUTPATIENT_CLINIC_OR_DEPARTMENT_OTHER): Payer: Medicare HMO | Admitting: Anesthesiology

## 2018-09-14 ENCOUNTER — Ambulatory Visit (HOSPITAL_BASED_OUTPATIENT_CLINIC_OR_DEPARTMENT_OTHER)
Admission: RE | Admit: 2018-09-14 | Discharge: 2018-09-14 | Disposition: A | Discharge status: Home / Self Care | Payer: Medicare HMO | Source: Ambulatory Visit | Attending: Obstetrics and Gynecology | Admitting: Obstetrics and Gynecology

## 2018-09-14 ENCOUNTER — Encounter (HOSPITAL_BASED_OUTPATIENT_CLINIC_OR_DEPARTMENT_OTHER): Payer: Self-pay

## 2018-09-14 DIAGNOSIS — Z794 Long term (current) use of insulin: Secondary | ICD-10-CM | POA: Diagnosis not present

## 2018-09-14 DIAGNOSIS — J45909 Unspecified asthma, uncomplicated: Secondary | ICD-10-CM | POA: Insufficient documentation

## 2018-09-14 DIAGNOSIS — N841 Polyp of cervix uteri: Secondary | ICD-10-CM | POA: Insufficient documentation

## 2018-09-14 DIAGNOSIS — Z881 Allergy status to other antibiotic agents status: Secondary | ICD-10-CM | POA: Diagnosis not present

## 2018-09-14 DIAGNOSIS — M1990 Unspecified osteoarthritis, unspecified site: Secondary | ICD-10-CM | POA: Insufficient documentation

## 2018-09-14 DIAGNOSIS — Z888 Allergy status to other drugs, medicaments and biological substances status: Secondary | ICD-10-CM | POA: Insufficient documentation

## 2018-09-14 DIAGNOSIS — Z791 Long term (current) use of non-steroidal anti-inflammatories (NSAID): Secondary | ICD-10-CM | POA: Insufficient documentation

## 2018-09-14 DIAGNOSIS — I10 Essential (primary) hypertension: Secondary | ICD-10-CM | POA: Insufficient documentation

## 2018-09-14 DIAGNOSIS — Z79899 Other long term (current) drug therapy: Secondary | ICD-10-CM | POA: Insufficient documentation

## 2018-09-14 DIAGNOSIS — Z833 Family history of diabetes mellitus: Secondary | ICD-10-CM | POA: Insufficient documentation

## 2018-09-14 DIAGNOSIS — N879 Dysplasia of cervix uteri, unspecified: Secondary | ICD-10-CM | POA: Diagnosis not present

## 2018-09-14 DIAGNOSIS — Z6838 Body mass index (BMI) 38.0-38.9, adult: Secondary | ICD-10-CM | POA: Diagnosis not present

## 2018-09-14 DIAGNOSIS — E119 Type 2 diabetes mellitus without complications: Secondary | ICD-10-CM | POA: Diagnosis not present

## 2018-09-14 DIAGNOSIS — N840 Polyp of corpus uteri: Secondary | ICD-10-CM | POA: Diagnosis not present

## 2018-09-14 HISTORY — DX: Unspecified sensorineural hearing loss: H90.5

## 2018-09-14 HISTORY — DX: Cellulitis, unspecified: L03.90

## 2018-09-14 HISTORY — DX: Personal history of other diseases of the digestive system: Z87.19

## 2018-09-14 HISTORY — PX: LEEP: SHX91

## 2018-09-14 HISTORY — DX: Obstructive sleep apnea (adult) (pediatric): Z99.89

## 2018-09-14 HISTORY — DX: Obstructive sleep apnea (adult) (pediatric): G47.33

## 2018-09-14 HISTORY — DX: Iron deficiency anemia, unspecified: D50.9

## 2018-09-14 LAB — TYPE AND SCREEN
ABO/RH(D): A POS
Antibody Screen: NEGATIVE

## 2018-09-14 LAB — GLUCOSE, CAPILLARY
Glucose-Capillary: 202 mg/dL — ABNORMAL HIGH (ref 70–99)
Glucose-Capillary: 122 mg/dL — ABNORMAL HIGH (ref 70–99)

## 2018-09-14 SURGERY — LEEP (LOOP ELECTROSURGICAL EXCISION PROCEDURE)
Anesthesia: General | Site: "Vagina "

## 2018-09-14 MED ORDER — MIDAZOLAM HCL 2 MG/2ML IJ SOLN
INTRAMUSCULAR | Status: AC
  Filled 2018-09-14: qty 2

## 2018-09-14 MED ORDER — PROPOFOL 500 MG/50ML IV EMUL
INTRAVENOUS | Status: AC
  Filled 2018-09-14: qty 50

## 2018-09-14 MED ORDER — LIDOCAINE 2% (20 MG/ML) 5 ML SYRINGE
INTRAMUSCULAR | Status: AC
  Filled 2018-09-14: qty 5

## 2018-09-14 MED ORDER — ONDANSETRON HCL 4 MG/2ML IJ SOLN
INTRAMUSCULAR | Status: AC
  Filled 2018-09-14: qty 2

## 2018-09-14 MED ORDER — OXYCODONE-ACETAMINOPHEN 5-325 MG PO TABS
1.0000 | ORAL_TABLET | Freq: Once | ORAL | Status: AC
  Administered 2018-09-14: 1 via ORAL
  Filled 2018-09-14: qty 1

## 2018-09-14 MED ORDER — DEXAMETHASONE SODIUM PHOSPHATE 10 MG/ML IJ SOLN
INTRAMUSCULAR | Status: DC | PRN
  Administered 2018-09-14: 4 mg via INTRAVENOUS

## 2018-09-14 MED ORDER — FERRIC SUBSULFATE SOLN
Status: DC | PRN
  Administered 2018-09-14: 1

## 2018-09-14 MED ORDER — LACTATED RINGERS IV SOLN
INTRAVENOUS | Status: DC
  Administered 2018-09-14 (×2): via INTRAVENOUS
  Filled 2018-09-14: qty 1000

## 2018-09-14 MED ORDER — ONDANSETRON HCL 4 MG/2ML IJ SOLN
INTRAMUSCULAR | Status: DC | PRN
  Administered 2018-09-14: 4 mg via INTRAVENOUS

## 2018-09-14 MED ORDER — FENTANYL CITRATE (PF) 100 MCG/2ML IJ SOLN
INTRAMUSCULAR | Status: AC
  Filled 2018-09-14: qty 2

## 2018-09-14 MED ORDER — LIDOCAINE-EPINEPHRINE 1 %-1:100000 IJ SOLN
INTRAMUSCULAR | Status: DC | PRN
  Administered 2018-09-14: 3 mL

## 2018-09-14 MED ORDER — LIDOCAINE 2% (20 MG/ML) 5 ML SYRINGE
INTRAMUSCULAR | Status: DC | PRN
  Administered 2018-09-14: 100 mg via INTRAVENOUS

## 2018-09-14 MED ORDER — OXYCODONE-ACETAMINOPHEN 7.5-325 MG PO TABS: 1.0000 | ORAL_TABLET | Freq: Four times a day (QID) | ORAL | 0 refills | Status: AC | PRN

## 2018-09-14 MED ORDER — PROPOFOL 10 MG/ML IV BOLUS
INTRAVENOUS | Status: DC | PRN
  Administered 2018-09-14: 100 mg via INTRAVENOUS
  Administered 2018-09-14: 200 mg via INTRAVENOUS

## 2018-09-14 MED ORDER — FENTANYL CITRATE (PF) 100 MCG/2ML IJ SOLN
INTRAMUSCULAR | Status: DC | PRN
  Administered 2018-09-14 (×2): 25 ug via INTRAVENOUS

## 2018-09-14 MED ORDER — ONDANSETRON HCL 4 MG/2ML IJ SOLN
4.0000 mg | Freq: Once | INTRAMUSCULAR | Status: DC | PRN
  Filled 2018-09-14: qty 2

## 2018-09-14 MED ORDER — FENTANYL CITRATE (PF) 100 MCG/2ML IJ SOLN
25.0000 ug | INTRAMUSCULAR | Status: DC | PRN
  Administered 2018-09-14 (×2): 50 ug via INTRAVENOUS
  Filled 2018-09-14: qty 1

## 2018-09-14 MED ORDER — DEXAMETHASONE SODIUM PHOSPHATE 10 MG/ML IJ SOLN
INTRAMUSCULAR | Status: AC
  Filled 2018-09-14: qty 1

## 2018-09-14 MED ORDER — OXYCODONE-ACETAMINOPHEN 5-325 MG PO TABS
ORAL_TABLET | ORAL | Status: AC
  Filled 2018-09-14: qty 1

## 2018-09-14 SURGICAL SUPPLY — 32 items
APPLICATOR COTTON TIP 6 STRL (MISCELLANEOUS) ×3 IMPLANT
APPLICATOR COTTON TIP 6IN STRL (MISCELLANEOUS) ×3 IMPLANT
BLADE SURG 11 STRL SS (BLADE) ×4 IMPLANT
CATH SILICONE 16FRX5CC (CATHETERS) ×3 IMPLANT
ELECT BALL LEEP 5MM RED (ELECTRODE) ×3 IMPLANT
ELECT BLADE 6.5 EXT (BLADE) IMPLANT
ELECT LOOP LEEP RND 15X12 GRN (CUTTING LOOP) ×3
ELECT LOOP LEEP RND 20X12 WHT (CUTTING LOOP)
ELECT REM PT RETURN 9FT ADLT (ELECTROSURGICAL) ×3
ELECTRODE LOOP LP RND 15X12GRN (CUTTING LOOP) ×2 IMPLANT
ELECTRODE LOOP LP RND 20X12WHT (CUTTING LOOP) IMPLANT
ELECTRODE REM PT RTRN 9FT ADLT (ELECTROSURGICAL) ×3 IMPLANT
EXTENDER ELECT LOOP LEEP 10CM (CUTTING LOOP) ×3 IMPLANT
GLOVE BIO SURGEON STRL SZ7 (GLOVE) ×4 IMPLANT
GLOVE BIOGEL PI IND STRL 7.0 (GLOVE) ×3 IMPLANT
GLOVE BIOGEL PI INDICATOR 7.0 (GLOVE) ×1
GOWN STRL REUS W/TWL LRG LVL3 (GOWN DISPOSABLE) ×8 IMPLANT
HEMOSTAT SURGICEL 2X14 (HEMOSTASIS) IMPLANT
HIBICLENS CHG 4% 4OZ BTL (MISCELLANEOUS) ×4 IMPLANT
NS IRRIG 1000ML POUR BTL (IV SOLUTION) ×4 IMPLANT
PACK PERINEAL COLD (PAD) ×3 IMPLANT
PACK VAGINAL MINOR WOMEN LF (CUSTOM PROCEDURE TRAY) ×4 IMPLANT
PACK VAGINAL WOMENS (CUSTOM PROCEDURE TRAY) ×4 IMPLANT
PAD OB MATERNITY 4.3X12.25 (PERSONAL CARE ITEMS) ×1 IMPLANT
PENCIL SMOKE EVAC W/HOLSTER (ELECTROSURGICAL) ×4 IMPLANT
SCOPETTES 8  STERILE (MISCELLANEOUS) ×1
SCOPETTES 8 STERILE (MISCELLANEOUS) ×4 IMPLANT
SPONGE SURGIFOAM ABS GEL 12-7 (HEMOSTASIS) IMPLANT
SUT CHROMIC 1MO 4 18 CR8 (SUTURE) ×4 IMPLANT
SYR 20CC LL (SYRINGE) ×4 IMPLANT
TOWEL OR 17X24 6PK STRL BLUE (TOWEL DISPOSABLE) ×8 IMPLANT
VACUUM HOSE 7/8X10 W/ WAND (MISCELLANEOUS) ×3 IMPLANT

## 2018-09-14 NOTE — H&P (Signed)
  History and physical exam unchanged 

## 2018-09-14 NOTE — Transfer of Care (Signed)
Immediate Anesthesia Transfer of Care Note  Patient: Oceane Fosse Grinnell  Procedure(s) Performed: LOOP ELECTROSURGICAL EXCISION PROCEDURE (LEEP) (N/A Vagina )  Patient Location: PACU  Anesthesia Type:General  Level of Consciousness: awake, alert  and oriented  Airway & Oxygen Therapy: Patient Spontanous Breathing and Patient connected to face mask oxygen  Post-op Assessment: Report given to RN and Post -op Vital signs reviewed and stable  Post vital signs: Reviewed and stable  Last Vitals:  Vitals Value Taken Time  BP 149/63 09/14/2018  8:00 AM  Temp    Pulse 81 09/14/2018  8:01 AM  Resp 18 09/14/2018  8:01 AM  SpO2 100 % 09/14/2018  8:01 AM  Vitals shown include unvalidated device data.  Last Pain:  Vitals:   09/14/18 0603  TempSrc:   PainSc: 0-No pain      Patients Stated Pain Goal: 6 (37/16/96 7893)  Complications: No apparent anesthesia complications

## 2018-09-14 NOTE — Discharge Instructions (Signed)
° °  D & C Home care Instructions:   Personal hygiene:  Used sanitary napkins for vaginal drainage not tampons. Shower or tub bathe the day after your procedure. No douching until bleeding stops. Always wipe from front to back after  Elimination.  Activity: Do not drive or operate any equipment today. The effects of the anesthesia are still present and drowsiness may result. Rest today, not necessarily flat bed rest, just take it easy. You may resume your normal activity in one to 2 days.  Sexual activity: No intercourse for one week or as indicated by your physician  Diet: Eat a light diet as desired this evening. You may resume a regular diet tomorrow.  Return to work: One to 2 days.  General Expectations of your surgery: Vaginal bleeding should be no heavier than a normal period. Spotting may continue up to 10 days. Mild cramps may continue for a couple of days. You may have a regular period in 2-6 weeks.  Unexpected observations call your doctor if these occur: persistent or heavy bleeding. Severe abdominal cramping or pain. Elevation of temperature greater than 100F.      Post Anesthesia Home Care Instructions  Activity: Get plenty of rest for the remainder of the day. A responsible individual must stay with you for 24 hours following the procedure.  For the next 24 hours, DO NOT: -Drive a car -Operate machinery -Drink alcoholic beverages -Take any medication unless instructed by your physician -Make any legal decisions or sign important papers.  Meals: Start with liquid foods such as gelatin or soup. Progress to regular foods as tolerated. Avoid greasy, spicy, heavy foods. If nausea and/or vomiting occur, drink only clear liquids until the nausea and/or vomiting subsides. Call your physician if vomiting continues.  Special Instructions/Symptoms: Your throat may feel dry or sore from the anesthesia or the breathing tube placed in your throat during surgery. If this causes  discomfort, gargle with warm salt water. The discomfort should disappear within 24 hours.     

## 2018-09-14 NOTE — Anesthesia Procedure Notes (Signed)
Procedure Name: LMA Insertion Date/Time: 09/14/2018 7:32 AM Performed by: Genelle Bal, CRNA Pre-anesthesia Checklist: Patient identified, Emergency Drugs available, Suction available and Patient being monitored Patient Re-evaluated:Patient Re-evaluated prior to induction Oxygen Delivery Method: Circle system utilized Preoxygenation: Pre-oxygenation with 100% oxygen Induction Type: IV induction Ventilation: Mask ventilation without difficulty LMA: LMA inserted LMA Size: 4.0 Number of attempts: 2 Placement Confirmation: positive ETCO2 Tube secured with: Tape Dental Injury: Teeth and Oropharynx as per pre-operative assessment

## 2018-09-14 NOTE — Anesthesia Postprocedure Evaluation (Signed)
Anesthesia Post Note  Patient: Janice [name]  Procedure(s) Performed: LOOP ELECTROSURGICAL EXCISION PROCEDURE (LEEP) (N/A Vagina )     Patient location during evaluation: PACU Anesthesia Type: General Level of consciousness: awake and alert Pain management: pain level controlled Vital Signs Assessment: post-procedure vital signs reviewed and stable Respiratory status: spontaneous breathing, nonlabored ventilation, respiratory function stable and patient connected to nasal cannula oxygen Cardiovascular status: blood pressure returned to baseline and stable Postop Assessment: no apparent nausea or vomiting Anesthetic complications: no    Last Vitals:  Vitals:   09/14/18 0815 09/14/18 0830  BP: (!) 154/59 (!) 142/51  Pulse: 76 75  Resp: 16 12  Temp:    SpO2: 100% 94%    Last Pain:  Vitals:   09/14/18 0830  TempSrc:   PainSc: 9                  Tiajuana Amass

## 2018-09-14 NOTE — Brief Op Note (Signed)
09/14/2018  7:57 AM  PATIENT:  Janice [name]  68 y.o. female  PRE-OPERATIVE DIAGNOSIS:  endocervical polyp, dysplasia  POST-OPERATIVE DIAGNOSIS:  endocervical polyp, dysplasia  PROCEDURE:  Procedure(s): LOOP ELECTROSURGICAL EXCISION PROCEDURE (LEEP) (N/A)  SURGEON:  Surgeon(s) and Role:    * Arvella Nigh, MD - Primary  PHYSICIAN ASSISTANT:   ASSISTANTS: none   ANESTHESIA:   local and general  EBL:  2 mL   BLOOD ADMINISTERED:none  DRAINS: none   LOCAL MEDICATIONS USED:  XYLOCAINE   SPECIMEN:  Source of Specimen:  cervical cone  DISPOSITION OF SPECIMEN:  PATHOLOGY  COUNTS:  YES  TOURNIQUET:  * No tourniquets in log *  DICTATION: .Other Dictation: Dictation Number [id number]  PLAN OF CARE: Discharge to home after PACU  PATIENT DISPOSITION:  PACU - hemodynamically stable.   Delay start of Pharmacological VTE agent (>24hrs) due to surgical blood loss or risk of bleeding: not applicable

## 2018-09-14 NOTE — Op Note (Signed)
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  OPERATIVE PROCEDURE:  Loop electrical excision procedure of the cervix.  SURGEON:  Darlyn Chamber, MD  ANESTHESIA:  General with local.  ESTIMATED BLOOD LOSS:  Minimal.  PACKS AND DRAINS:  None.  INTRAOPERATIVE BLOOD PLACED:  None.  COMPLICATIONS:  None.  INDICATIONS:  Dictated in history and physical.  DESCRIPTION OF PROCEDURE:  The patient was taken to the OR and placed in supine position.  After satisfactory level of general anesthesia was obtained, she was placed in the dorsal lithotomy position using the Allen stirrups.  Perineum and vagina prepped  with antiseptic solution.  The patient was draped in sterile field.  Speculum was placed in the vaginal vault.  Cervix was visualized.  She had an endocervical outgrowth that looked polypoid in nature.  The remaining cervix was normal.  Xylocaine 1%  with epinephrine was then injected into the cervix.  We used the small loop.  We excised the endocervical area including the polyp and this was all sent for pathology.  We then cauterized the cone base.  Applied Monsel's.  We had good hemostasis and no  complications.  At this point in time, speculum had been removed.  The patient was taken out of dorsal lithotomy position once alert and extubated, was transferred to recovery room in good condition.  Sponge, instrument and needle count was correct by  the circulating nurse.  TN/NUANCE  D:09/14/2018 T:09/14/2018 JOB:[id number]

## 2018-09-17 ENCOUNTER — Encounter (HOSPITAL_BASED_OUTPATIENT_CLINIC_OR_DEPARTMENT_OTHER): Payer: Self-pay | Admitting: Obstetrics and Gynecology

## 2018-09-17 DIAGNOSIS — E1165 Type 2 diabetes mellitus with hyperglycemia: Secondary | ICD-10-CM | POA: Diagnosis not present

## 2018-09-17 DIAGNOSIS — I10 Essential (primary) hypertension: Secondary | ICD-10-CM | POA: Diagnosis not present

## 2018-09-17 DIAGNOSIS — R82998 Other abnormal findings in urine: Secondary | ICD-10-CM | POA: Diagnosis not present

## 2018-09-17 DIAGNOSIS — D649 Anemia, unspecified: Secondary | ICD-10-CM | POA: Diagnosis not present

## 2018-09-24 DIAGNOSIS — Z794 Long term (current) use of insulin: Secondary | ICD-10-CM | POA: Diagnosis not present

## 2018-09-24 DIAGNOSIS — D509 Iron deficiency anemia, unspecified: Secondary | ICD-10-CM | POA: Diagnosis not present

## 2018-09-24 DIAGNOSIS — G4733 Obstructive sleep apnea (adult) (pediatric): Secondary | ICD-10-CM | POA: Diagnosis not present

## 2018-09-24 DIAGNOSIS — E782 Mixed hyperlipidemia: Secondary | ICD-10-CM | POA: Diagnosis not present

## 2018-09-24 DIAGNOSIS — G6289 Other specified polyneuropathies: Secondary | ICD-10-CM | POA: Diagnosis not present

## 2018-09-24 DIAGNOSIS — E119 Type 2 diabetes mellitus without complications: Secondary | ICD-10-CM | POA: Diagnosis not present

## 2018-09-24 DIAGNOSIS — Z0000 Encounter for general adult medical examination without abnormal findings: Secondary | ICD-10-CM | POA: Diagnosis not present

## 2018-09-24 DIAGNOSIS — D649 Anemia, unspecified: Secondary | ICD-10-CM | POA: Diagnosis not present

## 2018-09-24 DIAGNOSIS — I10 Essential (primary) hypertension: Secondary | ICD-10-CM | POA: Diagnosis not present

## 2018-09-24 DIAGNOSIS — F329 Major depressive disorder, single episode, unspecified: Secondary | ICD-10-CM | POA: Diagnosis not present

## 2018-09-25 DIAGNOSIS — Z1212 Encounter for screening for malignant neoplasm of rectum: Secondary | ICD-10-CM | POA: Diagnosis not present

## 2018-09-25 LAB — IFOBT (OCCULT BLOOD): IFOBT: NEGATIVE

## 2018-11-28 DIAGNOSIS — D509 Iron deficiency anemia, unspecified: Secondary | ICD-10-CM | POA: Diagnosis not present

## 2018-11-28 DIAGNOSIS — I10 Essential (primary) hypertension: Secondary | ICD-10-CM | POA: Diagnosis not present

## 2018-11-28 DIAGNOSIS — Z794 Long term (current) use of insulin: Secondary | ICD-10-CM | POA: Diagnosis not present

## 2018-11-28 DIAGNOSIS — E1165 Type 2 diabetes mellitus with hyperglycemia: Secondary | ICD-10-CM | POA: Diagnosis not present

## 2018-11-28 DIAGNOSIS — D649 Anemia, unspecified: Secondary | ICD-10-CM | POA: Diagnosis not present

## 2018-12-14 DIAGNOSIS — R509 Fever, unspecified: Secondary | ICD-10-CM | POA: Diagnosis not present

## 2018-12-14 DIAGNOSIS — J181 Lobar pneumonia, unspecified organism: Secondary | ICD-10-CM | POA: Diagnosis not present

## 2018-12-14 DIAGNOSIS — R350 Frequency of micturition: Secondary | ICD-10-CM | POA: Diagnosis not present

## 2018-12-14 DIAGNOSIS — R05 Cough: Secondary | ICD-10-CM | POA: Diagnosis not present

## 2018-12-14 DIAGNOSIS — Z20818 Contact with and (suspected) exposure to other bacterial communicable diseases: Secondary | ICD-10-CM | POA: Diagnosis not present

## 2018-12-24 ENCOUNTER — Inpatient Hospital Stay (HOSPITAL_COMMUNITY): Admission: RE | Admit: 2018-12-24 | Payer: Medicare HMO | Source: Ambulatory Visit

## 2018-12-31 ENCOUNTER — Ambulatory Visit (HOSPITAL_COMMUNITY): Admission: RE | Admit: 2018-12-31 | Payer: Medicare HMO | Source: Home / Self Care | Admitting: Orthopedic Surgery

## 2018-12-31 ENCOUNTER — Other Ambulatory Visit (HOSPITAL_COMMUNITY): Payer: Medicare HMO

## 2018-12-31 ENCOUNTER — Encounter (HOSPITAL_COMMUNITY): Admission: RE | Payer: Self-pay | Source: Home / Self Care

## 2018-12-31 SURGERY — ARTHROPLASTY, KNEE, TOTAL
Anesthesia: Spinal | Laterality: Left

## 2019-01-10 DIAGNOSIS — R05 Cough: Secondary | ICD-10-CM | POA: Diagnosis not present

## 2019-01-10 DIAGNOSIS — E1165 Type 2 diabetes mellitus with hyperglycemia: Secondary | ICD-10-CM | POA: Diagnosis not present

## 2019-01-10 DIAGNOSIS — J45909 Unspecified asthma, uncomplicated: Secondary | ICD-10-CM | POA: Diagnosis not present

## 2019-01-10 DIAGNOSIS — R0602 Shortness of breath: Secondary | ICD-10-CM | POA: Diagnosis not present

## 2019-01-29 DIAGNOSIS — G4733 Obstructive sleep apnea (adult) (pediatric): Secondary | ICD-10-CM | POA: Diagnosis not present

## 2019-01-29 DIAGNOSIS — E119 Type 2 diabetes mellitus without complications: Secondary | ICD-10-CM | POA: Diagnosis not present

## 2019-01-29 DIAGNOSIS — I129 Hypertensive chronic kidney disease with stage 1 through stage 4 chronic kidney disease, or unspecified chronic kidney disease: Secondary | ICD-10-CM | POA: Diagnosis not present

## 2019-01-29 DIAGNOSIS — N182 Chronic kidney disease, stage 2 (mild): Secondary | ICD-10-CM | POA: Diagnosis not present

## 2019-01-29 DIAGNOSIS — R0609 Other forms of dyspnea: Secondary | ICD-10-CM | POA: Diagnosis not present

## 2019-01-29 DIAGNOSIS — Z794 Long term (current) use of insulin: Secondary | ICD-10-CM | POA: Diagnosis not present

## 2019-01-31 NOTE — Progress Notes (Signed)
Primary Physician:  Prince Solian, MD   Patient ID: Janice [name], female    DOB: December 25, 1950, 68 y.o.   MRN: [id number]  Subjective:    Chief Complaint  Patient presents with  . New Patient (Initial Visit)  . Shortness of Breath    HPI: Janice [name]  is a 68 y.o. female  with type 2 diabetes, hypertension, OSA on CPAP, DJD, referred to [place] by Dr. Dagmar Hait for evaluation of dyspnea on exertion and also for perioperative cardiac risk stratification.   Patient reports that she was found to have pneumonia in early April. Had negative CXR. Negative COVID test. She previously had shortness of breath prior to pneumonia; however, not like she is experiencing now. Since then, she has had progressively worsening dyspnea on exertion, fatigue, and chest tightness. Chest pain is without radiation. Symptoms improve with resting.  She does notice coughing with deep breathing. She has not noticed any leg swelling. No PND or orthopnea.  Diabetes was well controlled until she was started on prednisone for her pneumonia and has now been elevated. Hypertension and lipids are well controlled. No former tobacco use.   She was scheduled for left knee arthroplasty in April that has now been delayed in view of COVID. She denies any previous cardiac testing.     Past Medical History:  Diagnosis Date  . Allergy   . Asthma    seasonal  . Cellulitis 2013   Left toe  . Common migraine    History of  . Complication of anesthesia    Per pt/had breathing problems with "block" during rotator cuff surgery. 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  . DDD (degenerative disc disease)   . Depression    denie.  . Diabetes mellitus   . Diabetic peripheral neuropathy ([place])   . Diverticulitis   . Diverticulosis   . DJD (degenerative joint disease)   . GERD (gastroesophageal reflux disease)   . History of colon polyps    hyperplastic  . History of gastric polyp   . Hyperlipidemia   . Hypertension   . Iron  deficiency anemia   . Obesity   . OSA on CPAP   . Sensorineural hearing loss     Past Surgical History:  Procedure Laterality Date  . ANKLE SURGERY     Left   . COLONOSCOPY  05/2018  . LEEP N/A 09/14/2018   Procedure: LOOP ELECTROSURGICAL EXCISION PROCEDURE (LEEP);  Surgeon: Arvella Nigh, MD;  Location: [facility];  Service: Gynecology;  Laterality: N/A;  . ROTATOR CUFF REPAIR Bilateral 2012, 2015  . TONSILLECTOMY    . UPPER GI ENDOSCOPY  05/2018    Social History   Socioeconomic History  . Marital status: Married    Spouse name: Not on file  . Number of children: 0  . Years of education: 3 years college  . Highest education level: Not on file  Occupational History  . Occupation: Unemployed  Social Needs  . Financial resource strain: Not on file  . Food insecurity:    Worry: Not on file    Inability: Not on file  . Transportation needs:    Medical: Not on file    Non-medical: Not on file  Tobacco Use  . Smoking status: Never Smoker  . Smokeless tobacco: Never Used  Substance and Sexual Activity  . Alcohol use: Yes    Comment: very rare  . Drug use: No  . Sexual activity: Never    Birth control/protection:  None, Post-menopausal  Lifestyle  . Physical activity:    Days per week: Not on file    Minutes per session: Not on file  . Stress: Not on file  Relationships  . Social connections:    Talks on phone: Not on file    Gets together: Not on file    Attends religious service: Not on file    Active member of club or organization: Not on file    Attends meetings of clubs or organizations: Not on file    Relationship status: Not on file  . Intimate partner violence:    Fear of current or ex partner: Not on file    Emotionally abused: Not on file    Physically abused: Not on file    Forced sexual activity: Not on file  Other Topics Concern  . Not on file  Social History Narrative   1 caffeine drink daily    Right-handed   Lives at home with  husband.    Review of Systems  Constitution: Positive for malaise/fatigue. Negative for decreased appetite, weight gain and weight loss.  Eyes: Negative for visual disturbance.  Cardiovascular: Positive for chest pain and dyspnea on exertion (progressively worsening). Negative for claudication, leg swelling, orthopnea, palpitations and syncope.  Respiratory: Negative for hemoptysis and wheezing.   Endocrine: Negative for cold intolerance and heat intolerance.  Hematologic/Lymphatic: Does not bruise/bleed easily.  Skin: Negative for nail changes.  Musculoskeletal: Positive for joint pain (left knee). Negative for muscle weakness and myalgias.  Gastrointestinal: Negative for abdominal pain, change in bowel habit, nausea and vomiting.  Neurological: Negative for difficulty with concentration, dizziness, focal weakness and headaches.  Psychiatric/Behavioral: Negative for altered mental status and suicidal ideas.  All other systems reviewed and are negative.     Objective:  Blood pressure (!) 156/75, pulse 79, temperature 98.9 F (37.2 C), height 5' 4.5" (1.638 m), weight 229 lb 6.4 oz (104.1 kg), SpO2 90 %. Body mass index is 38.77 kg/m.    Physical Exam  Constitutional: She is oriented to person, place, and time. Vital signs are normal. She appears well-developed and well-nourished.  HENT:  Head: Normocephalic and atraumatic.  Neck: Normal range of motion.  Cardiovascular: Normal rate, regular rhythm and intact distal pulses.  Murmur heard.  Early systolic murmur is present with a grade of 2/6. 2+ pitting edema bilateral  Pulmonary/Chest: Effort normal and breath sounds normal. No accessory muscle usage. No respiratory distress.  Abdominal: Soft. Bowel sounds are normal.  Musculoskeletal: Normal range of motion.  Neurological: She is alert and oriented to person, place, and time.  Skin: Skin is warm and dry.  Vitals reviewed.  Radiology: No results found.  Laboratory  examination:   01/29/2019: Glucose 148, Creatinine 0.8, eGFR 71/86, K 4.1, BMP normal. Normal H and H, Platelets low at 106, CBC otherwise normal.  CMP Latest Ref Rng & Units 08/31/2018 04/23/2012 01/30/2011  Glucose 70 - 99 mg/dL 64(L) 163(H) 114(H)  BUN 8 - 23 mg/dL _0 Creatinine 0.44 - 1.00 mg/dL 0.66 0.6 0.50  Sodium 135 - 145 mmol/L 142 139 141  Potassium 3.5 - 5.1 mmol/L 4.1 4.7 4.1  Chloride 98 - 111 mmol/L 105 100 108  CO2 22 - 32 mmol/L _1 Calcium 8.9 - 10.3 mg/dL 9.4 9.4 9.2  Total Protein 6.0 - 8.3 g/dL - - -  Total Bilirubin 0.3 - 1.2 mg/dL - - -  Alkaline Phos 39 - 117 U/L - - -  AST 0 - 37 U/L - - -  ALT 0 - 35 U/L - - -   CBC Latest Ref Rng & Units 08/31/2018 12/07/2013 09/28/2013  WBC 4.0 - 10.5 K/uL 9.6 5.4 5.8  Hemoglobin 12.0 - 15.0 g/dL 13.6 12.2 13.1  Hematocrit 36.0 - 46.0 % 45.5 39.5 42.7  Platelets 150 - 400 K/uL 116(L) - -   Lipid Panel  No results found for: CHOL, TRIG, HDL, CHOLHDL, VLDL, LDLCALC, LDLDIRECT HEMOGLOBIN A1C No results found for: HGBA1C, MPG TSH No results for input(s): TSH in the last 8760 hours.  PRN Meds:. Medications Discontinued During This Encounter  Medication Reason  . cetirizine (KLS ALLER-TEC) 10 MG tablet Change in therapy  . ferrous sulfate 325 (65 FE) MG tablet Patient Preference   Current Meds  Medication Sig  . acetaminophen (TYLENOL) 500 MG tablet Take 1,000 mg by mouth every 6 (six) hours as needed for moderate pain.   Marland Kitchen albuterol (PROVENTIL HFA;VENTOLIN HFA) 108 (90 Base) MCG/ACT inhaler Inhale 1-2 puffs into the lungs every 6 (six) hours as needed for wheezing or shortness of breath.  Marland Kitchen amLODipine (NORVASC) 10 MG tablet Take 10 mg by mouth daily.  . Calcium Carb-Cholecalciferol (CALCIUM 1000 + D PO) Take 2 tablets by mouth daily.   . Carboxymethylcellul-Glycerin (REFRESH OPTIVE PF OP) Apply 1 drop to eye 3 (three) times daily as needed (dry eyes).  Marland Kitchen desoximetasone (TOPICORT) 0.25 % cream Apply 1  application topically 4 (four) times daily as needed (rash).   . diclofenac sodium (VOLTAREN) 1 % GEL Apply 2 g topically 2 (two) times daily as needed (knee pain).   . Flaxseed, Linseed, OIL Take 1 capsule by mouth daily. daily  . fluticasone (VERAMYST) 27.5 MCG/SPRAY nasal spray Place 2 sprays into the nose daily.   Marland Kitchen gabapentin (NEURONTIN) 300 MG capsule Take 600 mg by mouth daily with supper.   . insulin NPH Human (HUMULIN N,NOVOLIN N) 100 UNIT/ML injection Inject 40-75 Units into the skin See admin instructions. Inject 75 units subcutaneously at breakfast, 40 units at lunch and 65 units at supper  . insulin regular (NOVOLIN R,HUMULIN R) 100 units/mL injection Inject 30 Units into the skin 3 (three) times daily before meals.   . irbesartan (AVAPRO) 300 MG tablet Take 300 mg by mouth every morning.   . loratadine (CLARITIN) 10 MG tablet Take 10 mg by mouth daily.  . metFORMIN (GLUCOPHAGE XR) 500 MG 24 hr tablet Take 1,000 mg by mouth 2 (two) times daily.   Marland Kitchen nystatin-triamcinolone (MYCOLOG II) cream Apply 1 application topically daily as needed (rash).   Marland Kitchen omeprazole (PRILOSEC) 40 MG capsule Take 40 mg by mouth 2 (two) times a day.   Marland Kitchen OVER THE COUNTER MEDICATION Take 2 capsules by mouth daily. Beef liver Iron Supplement  . PARoxetine (PAXIL) 10 MG tablet Take 5 mg by mouth daily with supper.   . rosuvastatin (CRESTOR) 5 MG tablet Take 5 mg by mouth daily.  Marland Kitchen terconazole (TERAZOL 3) 0.8 % vaginal cream Place 1 applicator vaginally as needed.   . vitamin C (ASCORBIC ACID) 500 MG tablet Take 1,000 mg by mouth daily.     Cardiac Studies:     Assessment:   DOE (dyspnea on exertion) - Plan: EKG 12-Lead, PCV MYOCARDIAL PERFUSION WITH LEXISCAN, PCV ECHOCARDIOGRAM COMPLETE  Atypical chest pain  Preoperative cardiovascular examination  Bilateral leg edema  Essential hypertension  Controlled type 2 diabetes mellitus without complication, with long-term current use of insulin (HCC)  EKG  02/01/2019: Normal sinus rhythm at 76 bpm, left atrial enlargement, normal axis, PRWP, cannot exclude anterior infarct old. T wave flattening in inferolateral leads, cannot exclude ischemia.   Recommendations:   Patient has had worsening dyspnea on exertion along with occasional chest tightness since recent diagnosis of pneumonia in April. She has multiple risk factors for CAD. Her symptoms are possibly related to recent pneumonia and also question if she may have some underlying reactive airway disease in view of the coughing with deep breathing and also states that strong smells can also exacerbate her symptoms. In view of her risk factors and recent progression of symptoms, will obtain lexiscan nuclear stress testing for further evaluation as well as echocardiogram to exclude structural abnormalities. She also has pending left knee surgery. Once testing is complete, will send clearance letter. If testing is negative for cardiac etiology, could consider pulmonary evaluation.   She is noted to have significant leg swelling today. Suspect may also be contributed by her diet. She was counseled on this. Will start her on chlorthalidone to hopefully help with her swelling and also potentially shortness of breath. Blood pressure was also elevated today. Diabetes is being managed by PCP. I will schedule for virtual visit after the test to discuss results and reevaluate her symptoms.   *I have discussed this case with Dr. Virgina Jock and he personally examined the patient and participated in formulating the plan.*   Miquel Dunn, MSN, APRN, FNP-C [facility]. [place] Office: [phone] Fax: [phone]

## 2019-02-01 ENCOUNTER — Ambulatory Visit: Payer: Medicare HMO | Admitting: Cardiology

## 2019-02-01 ENCOUNTER — Encounter: Payer: Self-pay | Admitting: Cardiology

## 2019-02-01 ENCOUNTER — Other Ambulatory Visit: Payer: Self-pay

## 2019-02-01 VITALS — BP 156/75 | HR 79 | Temp 98.9°F | Ht 64.5 in | Wt 229.4 lb

## 2019-02-01 DIAGNOSIS — R0609 Other forms of dyspnea: Secondary | ICD-10-CM

## 2019-02-01 DIAGNOSIS — I10 Essential (primary) hypertension: Secondary | ICD-10-CM | POA: Diagnosis not present

## 2019-02-01 DIAGNOSIS — Z794 Long term (current) use of insulin: Secondary | ICD-10-CM

## 2019-02-01 DIAGNOSIS — R600 Localized edema: Secondary | ICD-10-CM | POA: Diagnosis not present

## 2019-02-01 DIAGNOSIS — E119 Type 2 diabetes mellitus without complications: Secondary | ICD-10-CM | POA: Diagnosis not present

## 2019-02-01 DIAGNOSIS — R0600 Dyspnea, unspecified: Secondary | ICD-10-CM

## 2019-02-01 DIAGNOSIS — Z01810 Encounter for preprocedural cardiovascular examination: Secondary | ICD-10-CM | POA: Diagnosis not present

## 2019-02-01 DIAGNOSIS — R0789 Other chest pain: Secondary | ICD-10-CM

## 2019-02-01 MED ORDER — CHLORTHALIDONE 25 MG PO TABS: 12.5000 mg | ORAL_TABLET | Freq: Every day | ORAL | 1 refills | Status: DC

## 2019-02-11 ENCOUNTER — Telehealth: Payer: Self-pay

## 2019-02-11 NOTE — Telephone Encounter (Signed)
Pt called stating that her copay for stress ordered will be $180 and wants to know that since you thought that it was prob lung related and not heart, can she just do the echo which copay is $45 instead of both.//ah

## 2019-02-11 NOTE — Telephone Encounter (Signed)
Pt aware and will keep appt.//ah

## 2019-02-13 DIAGNOSIS — G4733 Obstructive sleep apnea (adult) (pediatric): Secondary | ICD-10-CM | POA: Diagnosis not present

## 2019-02-13 DIAGNOSIS — N182 Chronic kidney disease, stage 2 (mild): Secondary | ICD-10-CM | POA: Diagnosis not present

## 2019-02-13 DIAGNOSIS — I129 Hypertensive chronic kidney disease with stage 1 through stage 4 chronic kidney disease, or unspecified chronic kidney disease: Secondary | ICD-10-CM | POA: Diagnosis not present

## 2019-02-13 DIAGNOSIS — E782 Mixed hyperlipidemia: Secondary | ICD-10-CM | POA: Diagnosis not present

## 2019-02-13 DIAGNOSIS — M1990 Unspecified osteoarthritis, unspecified site: Secondary | ICD-10-CM | POA: Diagnosis not present

## 2019-02-13 DIAGNOSIS — R609 Edema, unspecified: Secondary | ICD-10-CM | POA: Diagnosis not present

## 2019-02-13 DIAGNOSIS — J45909 Unspecified asthma, uncomplicated: Secondary | ICD-10-CM | POA: Diagnosis not present

## 2019-02-13 DIAGNOSIS — E119 Type 2 diabetes mellitus without complications: Secondary | ICD-10-CM | POA: Diagnosis not present

## 2019-02-13 DIAGNOSIS — R0609 Other forms of dyspnea: Secondary | ICD-10-CM | POA: Diagnosis not present

## 2019-02-18 ENCOUNTER — Other Ambulatory Visit: Payer: Self-pay

## 2019-02-18 ENCOUNTER — Ambulatory Visit (INDEPENDENT_AMBULATORY_CARE_PROVIDER_SITE_OTHER): Payer: Medicare HMO

## 2019-02-18 DIAGNOSIS — R0609 Other forms of dyspnea: Secondary | ICD-10-CM

## 2019-02-18 DIAGNOSIS — R0600 Dyspnea, unspecified: Secondary | ICD-10-CM

## 2019-02-21 NOTE — Progress Notes (Signed)
Please let patient know that stress test was considered low risk study. I will discuss at [place]

## 2019-02-27 ENCOUNTER — Other Ambulatory Visit: Payer: Self-pay

## 2019-02-27 ENCOUNTER — Ambulatory Visit (INDEPENDENT_AMBULATORY_CARE_PROVIDER_SITE_OTHER): Payer: Medicare HMO

## 2019-02-27 DIAGNOSIS — R0609 Other forms of dyspnea: Secondary | ICD-10-CM | POA: Diagnosis not present

## 2019-02-27 DIAGNOSIS — R0600 Dyspnea, unspecified: Secondary | ICD-10-CM

## 2019-03-08 ENCOUNTER — Encounter: Payer: Self-pay | Admitting: Cardiology

## 2019-03-08 ENCOUNTER — Ambulatory Visit (INDEPENDENT_AMBULATORY_CARE_PROVIDER_SITE_OTHER): Payer: Medicare HMO | Admitting: Cardiology

## 2019-03-08 ENCOUNTER — Other Ambulatory Visit: Payer: Self-pay

## 2019-03-08 DIAGNOSIS — R0609 Other forms of dyspnea: Secondary | ICD-10-CM | POA: Diagnosis not present

## 2019-03-08 DIAGNOSIS — E119 Type 2 diabetes mellitus without complications: Secondary | ICD-10-CM

## 2019-03-08 DIAGNOSIS — Z01810 Encounter for preprocedural cardiovascular examination: Secondary | ICD-10-CM

## 2019-03-08 DIAGNOSIS — I10 Essential (primary) hypertension: Secondary | ICD-10-CM | POA: Diagnosis not present

## 2019-03-08 DIAGNOSIS — I351 Nonrheumatic aortic (valve) insufficiency: Secondary | ICD-10-CM | POA: Diagnosis not present

## 2019-03-08 DIAGNOSIS — Z794 Long term (current) use of insulin: Secondary | ICD-10-CM

## 2019-03-08 DIAGNOSIS — R0600 Dyspnea, unspecified: Secondary | ICD-10-CM

## 2019-03-08 NOTE — Progress Notes (Signed)
Primary Physician:  Prince Solian, MD   Patient ID: Janice [name], female    DOB: Apr 10, 1951, 68 y.o.   MRN: [id number]  Subjective:    Chief Complaint  Patient presents with  . Shortness of Breath  . Results   This visit type was conducted due to national recommendations for restrictions regarding the COVID-19 Pandemic (e.g. social distancing).  This format is felt to be most appropriate for this patient at this time.  All issues noted in this document were discussed and addressed.  No physical exam was performed (except for noted visual exam findings with Telehealth visits).  The patient has consented to conduct a Telehealth visit and understands insurance will be billed.   I discussed the limitations of evaluation and management by telemedicine and the availability of in person appointments. The patient expressed understanding and agreed to proceed.  Virtual Visit via Video Note is as below  I connected with Mrs. Slingerland, on 03/08/19 at 1500 by a video enabled telemedicine application and verified that I am speaking with the correct person using two identifiers.     I have discussed with her regarding the safety during COVID Pandemic and steps and precautions including social distancing with the patient.    HPI: Janice [name]  is a 68 y.o. female  with type 2 diabetes, hypertension, OSA on CPAP, DJD, recently evaluated by [place] for dyspnea on exertion and also for perioperative cardiac risk stratification.   Patient had reported chest tightness, fatigue, and progressively worsening dyspnea on exertion since having pneumonia in April. In view of upcoming surgery, symptoms, and risk factors, patient underwent lexiscan nuclear stress test and echocardiogram and now presents to discuss results.   Symptoms have improved since last seen by [place]. She has not had any chest tightness. Fatigue is improved. Shortness of breath is now only with over exertion, but is able to everything she  wants to do. She is tolerating chlorthalidone well, and has had some improvement in leg edema.   Diabetes was well controlled until she was started on prednisone for her pneumonia and has now been elevated. Hypertension and lipids are well controlled. No former tobacco use.   She was scheduled for left knee arthroplasty in April that has now been delayed in view of COVID.     Past Medical History:  Diagnosis Date  . Allergy   . Asthma    seasonal  . Cellulitis 2013   Left toe  . Common migraine    History of  . Complication of anesthesia    Per pt/had breathing problems with "block" during rotator cuff surgery. 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  . DDD (degenerative disc disease)   . Depression    denie.  . Diabetes mellitus   . Diabetic peripheral neuropathy ([place])   . Diverticulitis   . Diverticulosis   . DJD (degenerative joint disease)   . GERD (gastroesophageal reflux disease)   . History of colon polyps    hyperplastic  . History of gastric polyp   . Hyperlipidemia   . Hypertension   . Iron deficiency anemia   . Obesity   . OSA on CPAP   . Sensorineural hearing loss     Past Surgical History:  Procedure Laterality Date  . ANKLE SURGERY     Left   . COLONOSCOPY  05/2018  . LEEP N/A 09/14/2018   Procedure: LOOP ELECTROSURGICAL EXCISION PROCEDURE (LEEP);  Surgeon: Arvella Nigh, MD;  Location: [facility];  Service: Gynecology;  Laterality: N/A;  . ROTATOR CUFF REPAIR Bilateral 2012, 2015  . TONSILLECTOMY    . UPPER GI ENDOSCOPY  05/2018    Social History   Socioeconomic History  . Marital status: Married    Spouse name: Not on file  . Number of children: 0  . Years of education: 3 years college  . Highest education level: Not on file  Occupational History  . Occupation: Unemployed  Social Needs  . Financial resource strain: Not on file  . Food insecurity    Worry: Not on file    Inability: Not on file  . Transportation needs     Medical: Not on file    Non-medical: Not on file  Tobacco Use  . Smoking status: Never Smoker  . Smokeless tobacco: Never Used  Substance and Sexual Activity  . Alcohol use: Yes    Comment: very rare  . Drug use: No  . Sexual activity: Never    Birth control/protection: None, Post-menopausal  Lifestyle  . Physical activity    Days per week: Not on file    Minutes per session: Not on file  . Stress: Not on file  Relationships  . Social Herbalist on phone: Not on file    Gets together: Not on file    Attends religious service: Not on file    Active member of club or organization: Not on file    Attends meetings of clubs or organizations: Not on file    Relationship status: Not on file  . Intimate partner violence    Fear of current or ex partner: Not on file    Emotionally abused: Not on file    Physically abused: Not on file    Forced sexual activity: Not on file  Other Topics Concern  . Not on file  Social History Narrative   1 caffeine drink daily    Right-handed   Lives at home with husband.    Review of Systems  Constitution: Positive for malaise/fatigue (improving). Negative for decreased appetite, weight gain and weight loss.  Eyes: Negative for visual disturbance.  Cardiovascular: Positive for dyspnea on exertion (with over exertion). Negative for chest pain, claudication, leg swelling, orthopnea, palpitations and syncope.  Respiratory: Negative for hemoptysis and wheezing.   Endocrine: Negative for cold intolerance and heat intolerance.  Hematologic/Lymphatic: Does not bruise/bleed easily.  Skin: Negative for nail changes.  Musculoskeletal: Positive for joint pain (left knee). Negative for muscle weakness and myalgias.  Gastrointestinal: Negative for abdominal pain, change in bowel habit, nausea and vomiting.  Neurological: Negative for difficulty with concentration, dizziness, focal weakness and headaches.  Psychiatric/Behavioral: Negative for  altered mental status and suicidal ideas.  All other systems reviewed and are negative.     Objective:  There were no vitals taken for this visit. There is no height or weight on file to calculate BMI.    Physical exam not performed or limited due to virtual visit.  Patient appeared to be in no distress, Neck was supple, respiration was not labored.  Please see exam details from prior visit is as below.   Physical Exam  Constitutional: She is oriented to person, place, and time. Vital signs are normal. She appears well-developed and well-nourished.  HENT:  Head: Normocephalic and atraumatic.  Neck: Normal range of motion.  Cardiovascular: Normal rate, regular rhythm and intact distal pulses.  Murmur heard.  Early systolic murmur is present with a grade of 2/6. 2+ pitting edema  bilateral  Pulmonary/Chest: Effort normal and breath sounds normal. No accessory muscle usage. No respiratory distress.  Abdominal: Soft. Bowel sounds are normal.  Musculoskeletal: Normal range of motion.  Neurological: She is alert and oriented to person, place, and time.  Skin: Skin is warm and dry.  Vitals reviewed.  Radiology: No results found.  Laboratory examination:   01/29/2019: Glucose 148, Creatinine 0.8, eGFR 71/86, K 4.1, BMP normal. Normal H and H, Platelets low at 106, CBC otherwise normal.  CMP Latest Ref Rng & Units 08/31/2018 04/23/2012 01/30/2011  Glucose 70 - 99 mg/dL 64(L) 163(H) 114(H)  BUN 8 - 23 mg/dL '9 11 11  '$ Creatinine 0.44 - 1.00 mg/dL 0.66 0.6 0.50  Sodium 135 - 145 mmol/L 142 139 141  Potassium 3.5 - 5.1 mmol/L 4.1 4.7 4.1  Chloride 98 - 111 mmol/L 105 100 108  CO2 22 - 32 mmol/L '25 29 24  '$ Calcium 8.9 - 10.3 mg/dL 9.4 9.4 9.2  Total Protein 6.0 - 8.3 g/dL - - -  Total Bilirubin 0.3 - 1.2 mg/dL - - -  Alkaline Phos 39 - 117 U/L - - -  AST 0 - 37 U/L - - -  ALT 0 - 35 U/L - - -   CBC Latest Ref Rng & Units 08/31/2018 12/07/2013 09/28/2013  WBC 4.0 - 10.5 K/uL 9.6 5.4 5.8   Hemoglobin 12.0 - 15.0 g/dL 13.6 12.2 13.1  Hematocrit 36.0 - 46.0 % 45.5 39.5 42.7  Platelets 150 - 400 K/uL 116(L) - -   Lipid Panel  No results found for: CHOL, TRIG, HDL, CHOLHDL, VLDL, LDLCALC, LDLDIRECT HEMOGLOBIN A1C No results found for: HGBA1C, MPG TSH No results for input(s): TSH in the last 8760 hours.  PRN Meds:. There are no discontinued medications. Current Meds  Medication Sig  . acetaminophen (TYLENOL) 500 MG tablet Take 1,000 mg by mouth every 6 (six) hours as needed for moderate pain.   Marland Kitchen albuterol (PROVENTIL HFA;VENTOLIN HFA) 108 (90 Base) MCG/ACT inhaler Inhale 1-2 puffs into the lungs every 6 (six) hours as needed for wheezing or shortness of breath.  Marland Kitchen amLODipine (NORVASC) 10 MG tablet Take 10 mg by mouth daily.  . Calcium Carb-Cholecalciferol (CALCIUM 1000 + D PO) Take 2 tablets by mouth daily.   . Carboxymethylcellul-Glycerin (REFRESH OPTIVE PF OP) Apply 1 drop to eye 3 (three) times daily as needed (dry eyes).  . chlorthalidone (HYGROTON) 25 MG tablet Take 0.5 tablets (12.5 mg total) by mouth daily. Take in the morning  . desoximetasone (TOPICORT) 0.25 % cream Apply 1 application topically 4 (four) times daily as needed (rash).   . diclofenac sodium (VOLTAREN) 1 % GEL Apply 2 g topically 2 (two) times daily as needed (knee pain).   . Flaxseed, Linseed, OIL Take 1 capsule by mouth daily. daily  . fluticasone (VERAMYST) 27.5 MCG/SPRAY nasal spray Place 2 sprays into the nose daily.   Marland Kitchen gabapentin (NEURONTIN) 300 MG capsule Take 600 mg by mouth daily with supper.   . insulin NPH Human (HUMULIN N,NOVOLIN N) 100 UNIT/ML injection Inject 40-75 Units into the skin See admin instructions. Inject 75 units subcutaneously at breakfast, 40 units at lunch and 65 units at supper  . insulin regular (NOVOLIN R,HUMULIN R) 100 units/mL injection Inject 30 Units into the skin 3 (three) times daily before meals.   . irbesartan (AVAPRO) 300 MG tablet Take 300 mg by mouth every  morning.   . metFORMIN (GLUCOPHAGE XR) 500 MG 24 hr tablet Take 1,000 mg  by mouth 2 (two) times daily.   Marland Kitchen omeprazole (PRILOSEC) 40 MG capsule Take 40 mg by mouth 2 (two) times a day.   Marland Kitchen OVER THE COUNTER MEDICATION Take 1 tablet by mouth daily. At night  . PARoxetine (PAXIL) 10 MG tablet Take 5 mg by mouth daily with supper.   . rosuvastatin (CRESTOR) 5 MG tablet Take 5 mg by mouth daily.  . vitamin C (ASCORBIC ACID) 500 MG tablet Take 1,000 mg by mouth daily.     Cardiac Studies:    Echocardiogram 02/27/2019: Normal LV systolic function with EF 67%. Left ventricle cavity is normal in size. Moderate concentric hypertrophy of the left ventricle. Normal global wall motion. Doppler evidence of grade I (impaired) diastolic dysfunction, normal LAP.  Left atrial cavity is mildly dilated. Trileaflet aortic valve. Moderate (Grade II) aortic regurgitation. Mild tricuspid regurgitation.  No evidence of pulmonary hypertension.  [facility]o Stress Test 02/18/2019: Stress EKG is non-diagnostic, as this is pharmacological stress test. Normal myocardial perfusion. Stress LV EF: 50%.  Low risk stress test.  Assessment:     ICD-10-CM   1. Preoperative cardiovascular examination  Z01.810   2. DOE (dyspnea on exertion)  R06.09   3. Essential hypertension  I10   4. Controlled type 2 diabetes mellitus without complication, with long-term current use of insulin (HCC)  E11.9    Z79.4   5. Moderate aortic regurgitation  I35.1     EKG 02/01/2019: Normal sinus rhythm at 76 bpm, left atrial enlargement, normal axis, PRWP, cannot exclude anterior infarct old. T wave flattening in inferolateral leads, cannot exclude ischemia.   Recommendations:   I have discussed test results with the patient and reassured. In view of low risk stress test, patient can proceed with surgery low risk for perioperative complication from CV standpoint. I will fax letter to Dr. Noemi Chapel.  Patient noted to have grade 1  diastolic dysfunction and mild LVH, likely contributed by age, diabetes, HTN, and weight. Would recommend aggressive control of risk factors. She does have moderate aortic regurgitation, will recommend continuing with amlodipine. I would recommend repeating echocardiogram in 1-2 years for follow up based upon 2014 ACC/AHA guidelines. I do not suspect that her symptoms are contributed by her valvular disorder in view of only moderate and no LV dilation or dysfunction. I suspect that her symptoms were related to pneumonia and as they are improving, do not feel that she needs pulmonary evaluation unless her symptoms again worsen or do not continue to improve.   She did not have a way to check her BP today, but states that she will have this checked. Tolerating medications well. Encouraged her to work to lose weight with diet and regular exercise as tolerated. We will see her back in 1 year for follow up on aortic regurgitation.    Miquel Dunn, MSN, APRN, FNP-C [facility]. [place] Office: [phone] Fax: [phone]

## 2019-03-14 DIAGNOSIS — E1165 Type 2 diabetes mellitus with hyperglycemia: Secondary | ICD-10-CM | POA: Diagnosis not present

## 2019-03-27 ENCOUNTER — Encounter: Payer: Self-pay | Admitting: Physician Assistant

## 2019-03-27 DIAGNOSIS — M1712 Unilateral primary osteoarthritis, left knee: Secondary | ICD-10-CM | POA: Diagnosis not present

## 2019-03-27 DIAGNOSIS — M51369 Other intervertebral disc degeneration, lumbar region without mention of lumbar back pain or lower extremity pain: Secondary | ICD-10-CM | POA: Diagnosis present

## 2019-03-27 DIAGNOSIS — M5136 Other intervertebral disc degeneration, lumbar region: Secondary | ICD-10-CM | POA: Diagnosis present

## 2019-03-27 HISTORY — DX: Unilateral primary osteoarthritis, left knee: M17.12

## 2019-03-27 NOTE — H&P (Signed)
TOTAL KNEE ADMISSION H&P  Patient is being admitted for left total knee arthroplasty.  Subjective:  Chief Complaint:left knee pain.  HPI: Janice [name], 68 y.o. female, has a history of pain and functional disability in the left knee due to arthritis and has failed non-surgical conservative treatments for greater than 12 weeks to includeNSAID's and/or analgesics, corticosteriod injections, viscosupplementation injections, flexibility and strengthening excercises, supervised PT with diminished ADL's post treatment, use of assistive devices, weight reduction as appropriate and activity modification.  Onset of symptoms was gradual, starting 10 years ago with gradually worsening course since that time. The patient noted no past surgery on the left knee(s).  Patient currently rates pain in the left knee(s) at 10 out of 10 with activity. Patient has night pain, worsening of pain with activity and weight bearing, pain that interferes with activities of daily living, crepitus and joint swelling.  Patient has evidence of subchondral sclerosis, periarticular osteophytes and joint space narrowing by imaging studies.  There is no active infection.  Patient Active Problem List   Diagnosis Date Noted  . Primary localized osteoarthritis of left knee 03/27/2019  . DDD (degenerative disc disease), lumbar   . Obstructive sleep apnea treated with continuous positive airway pressure (CPAP) 08/27/2018  . Chronic cough 12/30/2013  . Asthma with acute exacerbation 12/07/2013  . Diverticulitis 04/23/2012  . HTN (hypertension) 04/23/2012  . Diabetes mellitus ([place]) 04/23/2012  . Colon polyps 04/23/2012   Past Medical History:  Diagnosis Date  . Allergy   . Asthma    seasonal  . Cellulitis 2013   Left toe  . Common migraine    History of  . Complication of anesthesia    Per pt/had breathing problems with "block" during rotator cuff surgery. 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  . DDD (degenerative disc  disease)   . Depression    denie.  . Diabetes mellitus   . Diabetic peripheral neuropathy ([place])   . Diverticulitis   . Diverticulosis   . DJD (degenerative joint disease)   . GERD (gastroesophageal reflux disease)   . History of colon polyps    hyperplastic  . History of gastric polyp   . Hyperlipidemia   . Hypertension   . Iron deficiency anemia   . Obesity   . OSA on CPAP   . Primary localized osteoarthritis of left knee 03/27/2019  . Sensorineural hearing loss     Past Surgical History:  Procedure Laterality Date  . ANKLE SURGERY     Left   . COLONOSCOPY  05/2018  . LEEP N/A 09/14/2018   Procedure: LOOP ELECTROSURGICAL EXCISION PROCEDURE (LEEP);  Surgeon: Arvella Nigh, MD;  Location: [facility];  Service: Gynecology;  Laterality: N/A;  . ROTATOR CUFF REPAIR Bilateral 2012, 2015  . TONSILLECTOMY    . UPPER GI ENDOSCOPY  05/2018    No current facility-administered medications for this encounter.    Current Outpatient Medications  Medication Sig Dispense Refill Last Dose  . acetaminophen (TYLENOL) 500 MG tablet Take 1,000 mg by mouth every 6 (six) hours as needed for moderate pain.      Marland Kitchen albuterol (PROVENTIL HFA;VENTOLIN HFA) 108 (90 Base) MCG/ACT inhaler Inhale 1-2 puffs into the lungs every 6 (six) hours as needed for wheezing or shortness of breath.     Marland Kitchen amLODipine (NORVASC) 10 MG tablet Take 10 mg by mouth daily with breakfast.      . Carboxymethylcellul-Glycerin (REFRESH OPTIVE PF OP) Apply 1 drop to eye 3 (three) times  daily as needed (dry eyes).     . cetirizine (ZYRTEC) 10 MG tablet Take 10 mg by mouth daily.     . chlorthalidone (HYGROTON) 25 MG tablet Take 0.5 tablets (12.5 mg total) by mouth daily. Take in the morning 30 tablet 1   . desoximetasone (TOPICORT) 0.25 % cream Apply 1 application topically 4 (four) times daily as needed (rash (applied to left ear daily)).      . diclofenac sodium (VOLTAREN) 1 % GEL Apply 2 g topically 2 (two) times daily  as needed (knee pain).   2   . ferrous sulfate 325 (65 FE) MG tablet Take 325 mg by mouth at bedtime.     . Flaxseed, Linseed, OIL Take 1,000 mg by mouth daily.      . fluticasone (FLONASE) 50 MCG/ACT nasal spray Place 2 sprays into both nostrils daily.     Marland Kitchen gabapentin (NEURONTIN) 300 MG capsule Take 600 mg by mouth daily with supper.      . insulin NPH Human (HUMULIN N,NOVOLIN N) 100 UNIT/ML injection Inject 40-75 Units into the skin See admin instructions. Inject 75 units subcutaneously at breakfast, 40 units at lunch & 65 units at supper     . insulin regular (NOVOLIN R,HUMULIN R) 100 units/mL injection Inject 30-45 Units into the skin See admin instructions. Inject 40 units subcutaneously in the morning, 30 units at lunch & 45 units at supper     . irbesartan (AVAPRO) 300 MG tablet Take 300 mg by mouth daily with breakfast.      . metFORMIN (GLUCOPHAGE XR) 500 MG 24 hr tablet Take 1,000 mg by mouth 2 (two) times daily.      . Multiple Minerals-Vitamins (CAL MAG ZINC +D3 PO) Take 2 tablets by mouth daily.     Marland Kitchen omeprazole (PRILOSEC) 40 MG capsule Take 40 mg by mouth at bedtime.      Marland Kitchen PARoxetine (PAXIL) 20 MG tablet Take 10 mg by mouth daily.      . rosuvastatin (CRESTOR) 5 MG tablet Take 5 mg by mouth every evening.      Marland Kitchen terconazole (TERAZOL 3) 0.8 % vaginal cream Place 1 applicator vaginally as needed (yeast infections).      . vitamin C (ASCORBIC ACID) 500 MG tablet Take 500 mg by mouth 2 (two) times a day.       Allergies  Allergen Reactions  . Breo Ellipta [Fluticasone Furoate-Vilanterol] Hives  . Iodine Hives, Swelling and Other (See Comments)    And Betadine/caused hives and swelling  . Latex Hives, Itching, Rash and Other (See Comments)    Burning  . Tessalon Perles [Benzonatate] Anaphylaxis  . Zocor [Simvastatin] Other (See Comments)    Caused neuropathy in arms  . Phenergan [Promethazine] Nausea Only  . Wellbutrin [Bupropion]     Negative thoughts.  . Amoxicillin Hives     Blisters Did it involve swelling of the face/tongue/throat, SOB, or low BP? Yes Did it involve sudden or severe rash/hives, skin peeling, or any reaction on the inside of your mouth or nose? Yes Did you need to seek medical attention at a hospital or doctor's office?Unknown When did it last happen?2019 If all above answers are "NO", may proceed with cephalosporin use.   . Diflucan [Fluconazole] Hives and Other (See Comments)    blister  . Doxycycline Itching  . Levofloxacin Other (See Comments)    Joint aches//leg, shoulder pain    Social History   Tobacco Use  . Smoking status: Never  Smoker  . Smokeless tobacco: Never Used  Substance Use Topics  . Alcohol use: Yes    Comment: very rare    Family History  Problem Relation Age of Onset  . Breast cancer Mother   . Bone cancer Mother   . Heart failure Father   . Colon cancer Neg Hx   . Rectal cancer Neg Hx   . Stomach cancer Neg Hx   . Esophageal cancer Neg Hx   . Pancreatic cancer Neg Hx   . Liver disease Neg Hx      Review of Systems  Constitutional: Negative.   HENT: Negative.   Eyes: Negative.   Respiratory: Negative.   Cardiovascular: Negative.   Gastrointestinal: Negative.   Genitourinary: Negative.   Musculoskeletal: Positive for joint pain.  Skin: Negative.   Neurological: Negative.   Endo/Heme/Allergies: Negative.   Psychiatric/Behavioral: Negative.     Objective:  Physical Exam  Constitutional: She is oriented to person, place, and time. She appears well-developed and well-nourished.  HENT:  Head: Normocephalic and atraumatic.  Mouth/Throat: Oropharynx is clear and moist.  Eyes: Pupils are equal, round, and reactive to light.  Neck: Neck supple.  Cardiovascular: Normal rate.  Respiratory: Effort normal.  GI: Soft.  Genitourinary:    Genitourinary Comments: Not pertinent to current symptomatology therefore not examined.   Musculoskeletal:     Comments: She is independently ambulatory with  a moderately antalgic gait.  Left knee has active range of motion -5 to 120 degrees.  Varus deformity.  2+ crepitus, 2+ synovitis.  Distal neurovascular exam is intact.  Right knee has full range of motion without pain, swelling or deformity.    Neurological: She is alert and oriented to person, place, and time.  Skin: Skin is warm and dry.  Psychiatric: She has a normal mood and affect. Her behavior is normal.    Vital signs in last 24 hours: Temp:  [97 F (36.1 C)] 97 F (36.1 C) (07/15 1500) Pulse Rate:  [79] 79 (07/15 1500) BP: (141)/(63) 141/63 (07/15 1500) SpO2:  [98 %] 98 % (07/15 1500) Weight:  [102.5 kg] 102.5 kg (07/15 1500)  Labs:   Estimated body mass index is 38.79 kg/m as calculated from the following:   Height as of this encounter: 5\' 4"  (1.626 m).   Weight as of this encounter: 102.5 kg.   Imaging Review Plain radiographs demonstrate severe degenerative joint disease of the left knee(s). The overall alignment issignificant varus. The bone quality appears to be good for age and reported activity level.      Assessment/Plan:  End stage arthritis, left knee  Principal Problem:   Primary localized osteoarthritis of left knee Active Problems:   HTN (hypertension)   Diabetes mellitus (HCC)   Asthma with acute exacerbation   Obstructive sleep apnea treated with continuous positive airway pressure (CPAP)   DDD (degenerative disc disease), lumbar   The patient history, physical examination, clinical judgment of the provider and imaging studies are consistent with end stage degenerative joint disease of the left knee(s) and total knee arthroplasty is deemed medically necessary. The treatment options including medical management, injection therapy arthroscopy and arthroplasty were discussed at length. The risks and benefits of total knee arthroplasty were presented and reviewed. The risks due to aseptic loosening, infection, stiffness, patella tracking problems,  thromboembolic complications and other imponderables were discussed. The patient acknowledged the explanation, agreed to proceed with the plan and consent was signed. Patient is being admitted for inpatient treatment for surgery, pain  control, PT, OT, prophylactic antibiotics, VTE prophylaxis, progressive ambulation and ADL's and discharge planning. The patient is planning to be discharged home with home health services     Patient's anticipated LOS is less than 2 midnights, meeting these requirements: - Younger than 61 - Lives within 1 hour of care - Has a competent adult at home to recover with post-op recover - NO history of  - Chronic pain requiring opiods  - Diabetes  - Coronary Artery Disease  - Heart failure  - Heart attack  - Stroke  - DVT/VTE  - Cardiac arrhythmia  - Respiratory Failure/COPD  - Renal failure  - Anemia  - Advanced Liver disease

## 2019-03-28 ENCOUNTER — Encounter (HOSPITAL_COMMUNITY): Payer: Self-pay

## 2019-03-28 NOTE — Patient Instructions (Addendum)
YOU NEED TO HAVE A COVID 19 TEST ON_______ @_______ , THIS TEST MUST BE DONE BEFORE SURGERY, COME TO [place] ENTRANCE. ONCE YOUR COVID TEST IS COMPLETED, PLEASE BEGIN THE QUARANTINE INSTRUCTIONS AS OUTLINED IN YOUR HANDOUT.                Janice [name]   Your procedure is scheduled on: 04-08-2019  Report to [facility] Main  Entrance   Report to admitting at 720  AM   BRING CPAP [place]   Call this number if you have problems the morning of surgery [phone]    Remember: [place], NO CHEWING GUM CANDY OR MINTS.   NO SOLID FOOD AFTER MIDNIGHT THE NIGHT PRIOR TO SURGERY. NOTHING BY MOUTH EXCEPT CLEAR LIQUIDS UNTIL 650 AM. PLEASE FINISH G2 DRINK PER SURGEON ORDER 3 HOURS PRIOR TO SCHEDULED SURGERY TIME WHICH NEEDS TO BE COMPLETED AT 650 AM..   CLEAR LIQUID DIET   Foods Allowed                                                                     Foods Excluded  Coffee and tea, regular and decaf                             liquids that you cannot  Plain Jell-O any favor except red or purple                                           see through such as: Fruit ices (not with fruit pulp)                                     milk, soups, orange juice  Iced Popsicles                                    All solid food Carbonated beverages, regular and diet                                    Cranberry, grape and apple juices Sports drinks like Gatorade Lightly seasoned clear broth or consume(fat free) Sugar, honey syrup  Sample Menu Breakfast                                Lunch                                     Supper Cranberry juice                    Beef broth  Chicken broth Jell-O                                     Grape juice                           Apple juice Coffee or tea                        Jell-O                                      Popsicle                                                Coffee or tea                        Coffee or tea  _____________________________________________________________________     Take these medicines the morning of surgery with A SIP OF WATER: Paxil, flonase, zyrtec, amlodipine, inhaler ( bring with you)  DO NOT TAKE ANY   ORAL   DIABETIC MEDICATIONS DAY OF YOUR SURGERY        How to Manage Your Diabetes Before and After Surgery  Why is it important to control my blood sugar before and after surgery? . Improving blood sugar levels before and after surgery helps healing and can limit problems. . A way of improving blood sugar control is eating a healthy diet by: o  Eating less sugar and carbohydrates o  Increasing activity/exercise o  Talking with your doctor about reaching your blood sugar goals . High blood sugars (greater than 180 mg/dL) can raise your risk of infections and slow your recovery, so you will need to focus on controlling your diabetes during the weeks before surgery. . Make sure that the doctor who takes care of your diabetes knows about your planned surgery including the date and location.  How do I manage my blood sugar before surgery? . Check your blood sugar at least 4 times a day, starting 2 days before surgery, to make sure that the level is not too high or low. o Check your blood sugar the morning of your surgery when you wake up and every 2 hours until you get to the Short Stay unit. . If your blood sugar is less than 70 mg/dL, you will need to treat for low blood sugar: o Do not take insulin. o Treat a low blood sugar (less than 70 mg/dL) with  cup of clear juice (cranberry or apple), 4 glucose tablets, OR glucose gel. o Recheck blood sugar in 15 minutes after treatment (to make sure it is greater than 70 mg/dL). If your blood sugar is not greater than 70 mg/dL on recheck, call [phone] for further instructions. . Report your blood sugar to the short stay nurse  when you get to Short Stay.  . If you are admitted to the hospital after surgery: o Your blood sugar will be checked by the staff and you will probably be given insulin after surgery (instead of oral diabetes medicines) to make sure you have good blood sugar levels. o  The goal for blood sugar control after surgery is 80-180 mg/dL.   WHAT DO I DO ABOUT MY DIABETES MEDICATION?  . THE DAY BEFORE SURGERY TAKE METFORMIN AS USUAL..  . THE DAY  BEFORE SURGERY TAKE USUAL BREAKFAST DOSE OF NPH ( TAKE 75 UNITS), TAKE USUAL LUNCH DOSE OF NPH  (TAKE 40 UNITS), TAKE 1/2 SUPPER DOSE OF NPH (TAKE 32 UNITS)       . THE MORNING OF SURGERY TAKE 1/2 DOSE OF NPH (TAKE 32 UNITS).  .  THE DAY BEFORE SURGERY TAKE METFORMIN AS USUAL .  Marland Kitchen  THE DAY OF SURGERY DO NOT TAKE METFORMIN. .  .  TAKE HUMULIN R REGULAR DOSES DAY BEFORE SURGERY  .  DAY OF SURGERY If your CBG is greater than 220 mg/dL, you may take  of your sliding scale  . (correction) dose of HUMULIN R  insulin.     Reviewed and Endorsed by [facility] Patient Education Committee, August 2015                         You may not have any metal on your body including hair pins and              piercings  Do not wear jewelry, make-up, lotions, powders or perfumes, deodorant             Do not wear nail polish.  Do not shave  48 hours prior to surgery.              Men may shave face and neck.   Do not bring valuables to the hospital. [place].  Contacts, dentures or bridgework may not be worn into surgery.     ____________________________________________________________________   [facility] - Preparing for Surgery Before surgery, you can play an important role.  Because skin is not sterile, your skin needs to be as free of germs as possible.  You can reduce the number of germs on your skin by washing with CHG (chlorahexidine gluconate) soap before surgery.  CHG is an antiseptic cleaner which  kills germs and bonds with the skin to continue killing germs even after washing. Please DO NOT use if you have an allergy to CHG or antibacterial soaps.  If your skin becomes reddened/irritated stop using the CHG and inform your nurse when you arrive at Short Stay. Do not shave (including legs and underarms) for at least 48 hours prior to the first CHG shower.  You may shave your face/neck. Please follow these instructions carefully:  1.  Shower with CHG Soap the night before surgery and the  morning of Surgery.  2.  If you choose to wash your hair, wash your hair first as usual with your  normal  shampoo.  3.  After you shampoo, rinse your hair and body thoroughly to remove the  shampoo.                           4.  Use CHG as you would any other liquid soap.  You can apply chg directly  to the skin and wash                       Gently with a scrungie or clean washcloth.  5.  Apply the CHG Soap  to your body ONLY FROM THE NECK DOWN.   Do not use on face/ open                           Wound or open sores. Avoid contact with eyes, ears mouth and genitals (private parts).                       Wash face,  Genitals (private parts) with your normal soap.             6.  Wash thoroughly, paying special attention to the area where your surgery  will be performed.  7.  Thoroughly rinse your body with warm water from the neck down.  8.  DO NOT shower/wash with your normal soap after using and rinsing off  the CHG Soap.                9.  Pat yourself dry with a clean towel.            10.  Wear clean pajamas.            11.  Place clean sheets on your bed the night of your first shower and do not  sleep with pets. Day of Surgery : Do not apply any lotions/deodorants the morning of surgery.  Please wear clean clothes to the hospital/surgery center.  FAILURE TO FOLLOW THESE INSTRUCTIONS MAY RESULT IN THE CANCELLATION OF YOUR SURGERY PATIENT SIGNATURE_________________________________  NURSE  SIGNATURE__________________________________  ________________________________________________________________________   Adam Phenix  An incentive spirometer is a tool that can help keep your lungs clear and active. This tool measures how well you are filling your lungs with each breath. Taking long deep breaths may help reverse or decrease the chance of developing breathing (pulmonary) problems (especially infection) following:  A long period of time when you are unable to move or be active. BEFORE THE PROCEDURE   If the spirometer includes an indicator to show your best effort, your nurse or respiratory therapist will set it to a desired goal.  If possible, sit up straight or lean slightly forward. Try not to slouch.  Hold the incentive spirometer in an upright position. INSTRUCTIONS FOR USE  1. Sit on the edge of your bed if possible, or sit up as far as you can in bed or on a chair. 2. Hold the incentive spirometer in an upright position. 3. Breathe out normally. 4. Place the mouthpiece in your mouth and seal your lips tightly around it. 5. Breathe in slowly and as deeply as possible, raising the piston or the ball toward the top of the column. 6. Hold your breath for 3-5 seconds or for as long as possible. Allow the piston or ball to fall to the bottom of the column. 7. Remove the mouthpiece from your mouth and breathe out normally. 8. Rest for a few seconds and repeat Steps 1 through 7 at least 10 times every 1-2 hours when you are awake. Take your time and take a few normal breaths between deep breaths. 9. The spirometer may include an indicator to show your best effort. Use the indicator as a goal to work toward during each repetition. 10. After each set of 10 deep breaths, practice coughing to be sure your lungs are clear. If you have an incision (the cut made at the time of surgery), support your incision when coughing by placing a pillow or rolled up towels firmly  against it. Once you are able to get out of bed, walk around indoors and cough well. You may stop using the incentive spirometer when instructed by your caregiver.  RISKS AND COMPLICATIONS  Take your time so you do not get dizzy or light-headed.  If you are in pain, you may need to take or ask for pain medication before doing incentive spirometry. It is harder to take a deep breath if you are having pain. AFTER USE  Rest and breathe slowly and easily.  It can be helpful to keep track of a log of your progress. Your caregiver can provide you with a simple table to help with this. If you are using the spirometer at home, follow these instructions: [place] IF:   You are having difficultly using the spirometer.  You have trouble using the spirometer as often as instructed.  Your pain medication is not giving enough relief while using the spirometer.  You develop fever of 100.5 F (38.1 C) or higher. SEEK IMMEDIATE MEDICAL CARE IF:   You cough up bloody sputum that had not been present before.  You develop fever of 102 F (38.9 C) or greater.  You develop worsening pain at or near the incision site. MAKE SURE YOU:   Understand these instructions.  Will watch your condition.  Will get help right away if you are not doing well or get worse. Document Released: 01/09/2007 Document Revised: 11/21/2011 Document Reviewed: 03/12/2007 [facility] Patient Information 2014 [place], [place].   ________________________________________________________________________

## 2019-03-28 NOTE — Progress Notes (Signed)
CARDIAC CLEARANCE [name] [facility] FNP 03-08-19 Epic LOV [name] [facility] CARDIOLOGY FNP 02-01-19 EKG 02-01-19 Epic STRESS  TEST 02-18-19 EPIC ECHO 02-27-19 EPIC

## 2019-03-29 ENCOUNTER — Encounter (HOSPITAL_COMMUNITY)
Admission: RE | Admit: 2019-03-29 | Discharge: 2019-03-29 | Disposition: A | Discharge status: Home / Self Care | Payer: Medicare HMO | Source: Ambulatory Visit | Attending: Orthopedic Surgery | Admitting: Orthopedic Surgery

## 2019-03-29 ENCOUNTER — Other Ambulatory Visit: Payer: Self-pay

## 2019-03-29 ENCOUNTER — Encounter (HOSPITAL_COMMUNITY): Payer: Self-pay

## 2019-03-29 DIAGNOSIS — M1712 Unilateral primary osteoarthritis, left knee: Secondary | ICD-10-CM | POA: Diagnosis not present

## 2019-03-29 DIAGNOSIS — Z01812 Encounter for preprocedural laboratory examination: Secondary | ICD-10-CM | POA: Diagnosis not present

## 2019-03-29 HISTORY — DX: Pneumonia, unspecified organism: J18.9

## 2019-03-29 LAB — CBC WITH DIFFERENTIAL/PLATELET
Abs Immature Granulocytes: 0.09 10*3/uL — ABNORMAL HIGH (ref 0.00–0.07)
Basophils Absolute: 0.1 10*3/uL (ref 0.0–0.1)
Basophils Relative: 1 %
Eosinophils Absolute: 0.2 10*3/uL (ref 0.0–0.5)
Eosinophils Relative: 2 %
HCT: 40.8 % (ref 36.0–46.0)
Hemoglobin: 12.4 g/dL (ref 12.0–15.0)
Immature Granulocytes: 1 %
Lymphocytes Relative: 42 %
Lymphs Abs: 4.2 10*3/uL — ABNORMAL HIGH (ref 0.7–4.0)
MCH: 26.9 pg (ref 26.0–34.0)
MCHC: 30.4 g/dL (ref 30.0–36.0)
MCV: 88.5 fL (ref 80.0–100.0)
Monocytes Absolute: 0.8 10*3/uL (ref 0.1–1.0)
Monocytes Relative: 8 %
Neutro Abs: 4.8 10*3/uL (ref 1.7–7.7)
Neutrophils Relative %: 46 %
Platelets: 105 10*3/uL — ABNORMAL LOW (ref 150–400)
RBC: 4.61 MIL/uL (ref 3.87–5.11)
RDW: 14.9 % (ref 11.5–15.5)
WBC: 10.1 10*3/uL (ref 4.0–10.5)
nRBC: 0 % (ref 0.0–0.2)

## 2019-03-29 LAB — COMPREHENSIVE METABOLIC PANEL
ALT: 38 U/L (ref 0–44)
AST: 24 U/L (ref 15–41)
Albumin: 4.6 g/dL (ref 3.5–5.0)
Alkaline Phosphatase: 92 U/L (ref 38–126)
Anion gap: 12 (ref 5–15)
BUN: 15 mg/dL (ref 8–23)
CO2: 25 mmol/L (ref 22–32)
Calcium: 9 mg/dL (ref 8.9–10.3)
Chloride: 99 mmol/L (ref 98–111)
Creatinine, Ser: 0.68 mg/dL (ref 0.44–1.00)
GFR calc Af Amer: >60 mL/min (ref >60)
GFR calc non Af Amer: >60 mL/min (ref >60)
Glucose, Bld: 105 mg/dL — ABNORMAL HIGH (ref 70–99)
Potassium: 4.2 mmol/L (ref 3.5–5.1)
Sodium: 136 mmol/L (ref 135–145)
Total Bilirubin: 0.6 mg/dL (ref 0.3–1.2)
Total Protein: 7.1 g/dL (ref 6.5–8.1)

## 2019-03-29 LAB — SURGICAL PCR SCREEN
MRSA, PCR: NEGATIVE
Staphylococcus aureus: NEGATIVE

## 2019-03-29 LAB — HEMOGLOBIN A1C
Hgb A1c MFr Bld: 6.9 % — ABNORMAL HIGH (ref 4.8–5.6)
Mean Plasma Glucose: 151.33 mg/dL

## 2019-03-29 LAB — GLUCOSE, CAPILLARY: Glucose-Capillary: 138 mg/dL — ABNORMAL HIGH (ref 70–99)

## 2019-03-29 LAB — PROTIME-INR
INR: 1 (ref 0.8–1.2)
Prothrombin Time: 13.4 seconds (ref 11.4–15.2)

## 2019-03-29 LAB — APTT: aPTT: 27 seconds (ref 24–36)

## 2019-03-30 LAB — URINE CULTURE: Culture: <10000 — AB

## 2019-04-01 DIAGNOSIS — M25552 Pain in left hip: Secondary | ICD-10-CM | POA: Diagnosis not present

## 2019-04-01 DIAGNOSIS — M1712 Unilateral primary osteoarthritis, left knee: Secondary | ICD-10-CM | POA: Diagnosis not present

## 2019-04-02 DIAGNOSIS — I129 Hypertensive chronic kidney disease with stage 1 through stage 4 chronic kidney disease, or unspecified chronic kidney disease: Secondary | ICD-10-CM | POA: Diagnosis not present

## 2019-04-02 DIAGNOSIS — Z794 Long term (current) use of insulin: Secondary | ICD-10-CM | POA: Diagnosis not present

## 2019-04-02 DIAGNOSIS — N182 Chronic kidney disease, stage 2 (mild): Secondary | ICD-10-CM | POA: Diagnosis not present

## 2019-04-02 DIAGNOSIS — E1165 Type 2 diabetes mellitus with hyperglycemia: Secondary | ICD-10-CM | POA: Diagnosis not present

## 2019-04-03 NOTE — Progress Notes (Addendum)
Anesthesia Chart Review   Case: [id number] Date/Time: 04/08/19 0934   Procedure: TOTAL KNEE ARTHROPLASTY (Left )   Anesthesia type: Spinal   Pre-op diagnosis: djd left knee   Location: [facility] ROOM 07 / [facility]   Surgeon: Elsie Saas, MD      DISCUSSION:68 y.o. never smoker with h/o moderate aortic regurgitation, HTN, GERD, HLD, migraines, OSA on CPAP, DM II, left knee DJD scheduled for above procedure 04/08/2019 with Dr. Elsie Saas.   Pt reports with shoulder surgery she had "breathing problems and memory loss".  S/p general anesthesia 09/14/2018 with no anesthesia complications noted.   Pt seen by cardiology 03/08/2019 for preoperative evaluation.  Seen by Jeri Lager, NP.  Per OV note, "In view of low risk stress test, patient can proceed with surgery low risk for perioperative complication from CV standpoint.  Would recommend aggressive control of risk factors. She does have moderate aortic regurgitation, will recommend continuing with amlodipine. I would recommend repeating echocardiogram in 1-2 years for follow up based upon 2014 ACC/AHA guidelines. I do not suspect that her symptoms are contributed by her valvular disorder in view of only moderate and no LV dilation or dysfunction. I suspect that her symptoms were related to pneumonia and as they are improving, do not feel that she needs pulmonary evaluation unless her symptoms again worsen or do not continue to improve. "  She is scheduled for follow up of aortic regurgitation in 1 year.   Anticipate pt can proceed with planned procedure barring acute status change.   VS: BP (!) 144/62   Pulse 73   Temp 37.1 C (Oral)   Resp 16   Ht 5\' 4"  (1.626 m)   Wt 103.1 kg   SpO2 98%   BMI 39.00 kg/m   PROVIDERS: Avva, Ravisankar, MD is PCP   [facility] Card[facility]  LABS: Platlets 105 (all labs ordered are listed, but only abnormal results are displayed)  Labs Reviewed  URINE CULTURE - Abnormal; Notable for the following components:   Result Value   Culture   (*)    Value: <10,000 COLONIES/mL INSIGNIFICANT GROWTH Performed at [facility] [address], [place], [place] [address]    All other components within normal limits  GLUCOSE, CAPILLARY - Abnormal; Notable for the following components:   Glucose-Capillary 138 (*)    All other components within normal limits  CBC WITH DIFFERENTIAL/PLATELET - Abnormal; Notable for the following components:   Platelets 105 (*)    Lymphs Abs 4.2 (*)    Abs Immature Granulocytes 0.09 (*)    All other components within normal limits  COMPREHENSIVE METABOLIC PANEL - Abnormal; Notable for the following components:   Glucose, Bld 105 (*)    All other components within normal limits  HEMOGLOBIN A1C - Abnormal; Notable for the following components:   Hgb A1c MFr Bld 6.9 (*)    All other components within normal limits  SURGICAL PCR SCREEN  APTT  PROTIME-INR     IMAGES:   EKG: 02/02/2019 Rate 76 bpm Normal sinus rhythm at 76 bpm, left atrial enlargement, normal axis, PRWP, cannot exclude anterior infarct old. T wave flattening in inferolateral leads, cannot exclude ischemia  CV: Echocardiogram 02/27/2019: Normal LV systolic function with EF 67%. Left ventricle cavity is normal in size. Moderate concentric hypertrophy of the left ventricle. Normal global wall motion. Doppler evidence of grade I (impaired) diastolic dysfunction, normal LAP.  Left atrial cavity is mildly dilated. Trileaflet aortic valve. Moderate (Grade II) aortic  regurgitation. Mild tricuspid regurgitation.  No evidence of pulmonary hypertension.  Lexiscan Myoview Stress Test06/04/2019: Stress EKG is non-diagnostic, as this is pharmacological stress test. Normal myocardial perfusion. Stress LV EF: 50%.  Low risk stress test. Past Medical History:  Diagnosis Date  . Allergy   . Asthma    seasonal  . Cellulitis 2013   Left toe  . Common migraine    History of  . Complication of anesthesia     Per pt/had breathing problems with "block" during rotator cuff surgery. 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  . DDD (degenerative disc disease)   . Depression    denies takes paxil for migraines  . Diabetes mellitus    type 2  . Diabetic peripheral neuropathy ([place])   . Diverticulitis   . Diverticulosis   . DJD (degenerative joint disease)   . GERD (gastroesophageal reflux disease)   . History of colon polyps    hyperplastic  . History of gastric polyp   . Hyperlipidemia   . Hypertension   . Iron deficiency anemia   . Obesity   . OSA on CPAP    cpap  . Pneumonia    april 2020  mild  . Primary localized osteoarthritis of left knee 03/27/2019  . Sensorineural hearing loss     Past Surgical History:  Procedure Laterality Date  . ANKLE SURGERY     Left   . COLONOSCOPY  05/2018  . LEEP N/A 09/14/2018   Procedure: LOOP ELECTROSURGICAL EXCISION PROCEDURE (LEEP);  Surgeon: Arvella Nigh, MD;  Location: [facility];  Service: Gynecology;  Laterality: N/A;  . ROTATOR CUFF REPAIR Bilateral 2012, 2015  . TONSILLECTOMY    . UPPER GI ENDOSCOPY  05/2018    MEDICATIONS: . acetaminophen (TYLENOL) 500 MG tablet  . albuterol (PROVENTIL HFA;VENTOLIN HFA) 108 (90 Base) MCG/ACT inhaler  . amLODipine (NORVASC) 10 MG tablet  . Carboxymethylcellul-Glycerin (REFRESH OPTIVE PF OP)  . cetirizine (ZYRTEC) 10 MG tablet  . chlorthalidone (HYGROTON) 25 MG tablet  . desoximetasone (TOPICORT) 0.25 % cream  . diclofenac sodium (VOLTAREN) 1 % GEL  . ferrous sulfate 325 (65 FE) MG tablet  . Flaxseed, Linseed, OIL  . fluticasone (FLONASE) 50 MCG/ACT nasal spray  . gabapentin (NEURONTIN) 300 MG capsule  . insulin NPH Human (HUMULIN N,NOVOLIN N) 100 UNIT/ML injection  . insulin regular (NOVOLIN R,HUMULIN R) 100 units/mL injection  . irbesartan (AVAPRO) 300 MG tablet  . metFORMIN (GLUCOPHAGE XR) 500 MG 24 hr tablet  . Multiple Minerals-Vitamins (CAL MAG ZINC +D3 PO)  . omeprazole  (PRILOSEC) 40 MG capsule  . PARoxetine (PAXIL) 20 MG tablet  . rosuvastatin (CRESTOR) 5 MG tablet  . terconazole (TERAZOL 3) 0.8 % vaginal cream  . vitamin C (ASCORBIC ACID) 500 MG tablet   No current facility-administered medications for this encounter.    Maia Plan [facility] Pre-Surgical Testing [phone] 04/03/19  2:13 PM

## 2019-04-03 NOTE — Anesthesia Preprocedure Evaluation (Addendum)
Anesthesia Evaluation  Patient identified by MRN, date of birth, ID band Patient awake    Reviewed: Allergy & Precautions, NPO status , Patient's Chart, lab work & pertinent test results  Airway Mallampati: II  TM Distance: >3 FB Neck ROM: Full    Dental no notable dental hx.    Pulmonary sleep apnea ,    Pulmonary exam normal breath sounds clear to auscultation       Cardiovascular hypertension, + Valvular Problems/Murmurs AI  Rhythm:Regular Rate:Normal + Systolic murmurs    Neuro/Psych negative neurological ROS  negative psych ROS   GI/Hepatic negative GI ROS, Neg liver ROS,   Endo/Other  diabetes, Type 2Morbid obesity  Renal/GU negative Renal ROS  negative genitourinary   Musculoskeletal  (+) Arthritis , Osteoarthritis,    Abdominal   Peds negative pediatric ROS (+)  Hematology Chronic low platelets   Anesthesia Other Findings   Reproductive/Obstetrics negative OB ROS                          Anesthesia Physical Anesthesia Plan  ASA: III  Anesthesia Plan: General   Post-op Pain Management:  Regional for Post-op pain   Induction: Intravenous  PONV Risk Score and Plan: 2 and Ondansetron, Dexamethasone and Treatment may vary due to age or medical condition  Airway Management Planned: Simple Face Mask and Oral ETT  Additional Equipment:   Intra-op Plan:   Post-operative Plan:   Informed Consent: I have reviewed the patients History and Physical, chart, labs and discussed the procedure including the risks, benefits and alternatives for the proposed anesthesia with the patient or authorized representative who has indicated his/her understanding and acceptance.     Dental advisory given  Plan Discussed with: CRNA and Surgeon  Anesthesia Plan Comments: (See PAT note 03/29/2019, Konrad Felix, PA-C)      Anesthesia Quick Evaluation

## 2019-04-04 ENCOUNTER — Other Ambulatory Visit (HOSPITAL_COMMUNITY)
Admission: RE | Admit: 2019-04-04 | Discharge: 2019-04-04 | Disposition: A | Discharge status: Home / Self Care | Payer: Medicare HMO | Source: Ambulatory Visit | Attending: Orthopedic Surgery | Admitting: Orthopedic Surgery

## 2019-04-04 DIAGNOSIS — Z1159 Encounter for screening for other viral diseases: Secondary | ICD-10-CM | POA: Diagnosis not present

## 2019-04-04 LAB — SARS CORONAVIRUS 2 (TAT 6-24 HRS): SARS Coronavirus 2: NEGATIVE

## 2019-04-07 MED ORDER — BUPIVACAINE LIPOSOME 1.3 % IJ SUSP
20.0000 mL | INTRAMUSCULAR | Status: DC
  Filled 2019-04-07: qty 20

## 2019-04-07 MED ORDER — VANCOMYCIN HCL 10 G IV SOLR
1500.0000 mg | INTRAVENOUS | Status: AC
  Administered 2019-04-08: 08:00:00 1500 mg via INTRAVENOUS
  Filled 2019-04-07: qty 1500

## 2019-04-08 ENCOUNTER — Ambulatory Visit (HOSPITAL_COMMUNITY): Payer: Medicare HMO | Admitting: Certified Registered"

## 2019-04-08 ENCOUNTER — Encounter (HOSPITAL_COMMUNITY): Payer: Self-pay

## 2019-04-08 ENCOUNTER — Other Ambulatory Visit: Payer: Self-pay

## 2019-04-08 ENCOUNTER — Observation Stay (HOSPITAL_COMMUNITY)
Admission: RE | Admit: 2019-04-08 | Discharge: 2019-04-09 | Disposition: A | Discharge status: Home / Self Care | Payer: Medicare HMO | Source: Ambulatory Visit | Attending: Orthopedic Surgery | Admitting: Orthopedic Surgery

## 2019-04-08 ENCOUNTER — Encounter (HOSPITAL_COMMUNITY)
Admission: RE | Disposition: A | Discharge status: Home / Self Care | Payer: Self-pay | Source: Ambulatory Visit | Attending: Orthopedic Surgery

## 2019-04-08 ENCOUNTER — Ambulatory Visit (HOSPITAL_COMMUNITY): Payer: Medicare HMO | Admitting: Physician Assistant

## 2019-04-08 DIAGNOSIS — I10 Essential (primary) hypertension: Secondary | ICD-10-CM | POA: Diagnosis not present

## 2019-04-08 DIAGNOSIS — H905 Unspecified sensorineural hearing loss: Secondary | ICD-10-CM | POA: Diagnosis not present

## 2019-04-08 DIAGNOSIS — J45901 Unspecified asthma with (acute) exacerbation: Secondary | ICD-10-CM | POA: Diagnosis present

## 2019-04-08 DIAGNOSIS — E669 Obesity, unspecified: Secondary | ICD-10-CM | POA: Insufficient documentation

## 2019-04-08 DIAGNOSIS — E785 Hyperlipidemia, unspecified: Secondary | ICD-10-CM | POA: Diagnosis not present

## 2019-04-08 DIAGNOSIS — D509 Iron deficiency anemia, unspecified: Secondary | ICD-10-CM | POA: Diagnosis not present

## 2019-04-08 DIAGNOSIS — J45998 Other asthma: Secondary | ICD-10-CM | POA: Diagnosis not present

## 2019-04-08 DIAGNOSIS — G8918 Other acute postprocedural pain: Secondary | ICD-10-CM | POA: Diagnosis not present

## 2019-04-08 DIAGNOSIS — M5136 Other intervertebral disc degeneration, lumbar region: Secondary | ICD-10-CM | POA: Diagnosis present

## 2019-04-08 DIAGNOSIS — Z794 Long term (current) use of insulin: Secondary | ICD-10-CM | POA: Diagnosis not present

## 2019-04-08 DIAGNOSIS — Z6838 Body mass index (BMI) 38.0-38.9, adult: Secondary | ICD-10-CM | POA: Diagnosis not present

## 2019-04-08 DIAGNOSIS — G4733 Obstructive sleep apnea (adult) (pediatric): Secondary | ICD-10-CM | POA: Diagnosis not present

## 2019-04-08 DIAGNOSIS — Z79899 Other long term (current) drug therapy: Secondary | ICD-10-CM | POA: Diagnosis not present

## 2019-04-08 DIAGNOSIS — G4730 Sleep apnea, unspecified: Secondary | ICD-10-CM | POA: Insufficient documentation

## 2019-04-08 DIAGNOSIS — M1712 Unilateral primary osteoarthritis, left knee: Principal | ICD-10-CM | POA: Insufficient documentation

## 2019-04-08 DIAGNOSIS — E119 Type 2 diabetes mellitus without complications: Secondary | ICD-10-CM | POA: Diagnosis not present

## 2019-04-08 DIAGNOSIS — E1142 Type 2 diabetes mellitus with diabetic polyneuropathy: Secondary | ICD-10-CM | POA: Diagnosis not present

## 2019-04-08 DIAGNOSIS — K219 Gastro-esophageal reflux disease without esophagitis: Secondary | ICD-10-CM | POA: Diagnosis not present

## 2019-04-08 DIAGNOSIS — G43909 Migraine, unspecified, not intractable, without status migrainosus: Secondary | ICD-10-CM | POA: Insufficient documentation

## 2019-04-08 DIAGNOSIS — Z9989 Dependence on other enabling machines and devices: Secondary | ICD-10-CM

## 2019-04-08 HISTORY — PX: TOTAL KNEE ARTHROPLASTY: SHX125

## 2019-04-08 LAB — GLUCOSE, CAPILLARY
Glucose-Capillary: 162 mg/dL — ABNORMAL HIGH (ref 70–99)
Glucose-Capillary: 199 mg/dL — ABNORMAL HIGH (ref 70–99)
Glucose-Capillary: 253 mg/dL — ABNORMAL HIGH (ref 70–99)
Glucose-Capillary: 306 mg/dL — ABNORMAL HIGH (ref 70–99)

## 2019-04-08 SURGERY — ARTHROPLASTY, KNEE, TOTAL
Anesthesia: Spinal | Site: Knee | Laterality: Left

## 2019-04-08 MED ORDER — 0.9 % SODIUM CHLORIDE (POUR BTL) OPTIME
TOPICAL | Status: DC | PRN
  Administered 2019-04-08: 11:00:00 1000 mL

## 2019-04-08 MED ORDER — PHENOL 1.4 % MT LIQD
1.0000 | OROMUCOSAL | Status: DC | PRN
  Filled 2019-04-08: qty 177

## 2019-04-08 MED ORDER — ONDANSETRON HCL 4 MG/2ML IJ SOLN
4.0000 mg | Freq: Once | INTRAMUSCULAR | Status: AC | PRN
  Administered 2019-04-08: 13:00:00 4 mg via INTRAVENOUS

## 2019-04-08 MED ORDER — INSULIN NPH (HUMAN) (ISOPHANE) 100 UNIT/ML ~~LOC~~ SUSP: 40.0000 [IU] | SUBCUTANEOUS | Status: DC

## 2019-04-08 MED ORDER — PROMETHAZINE HCL 25 MG/ML IJ SOLN
INTRAMUSCULAR | Status: AC
  Filled 2019-04-08: qty 1

## 2019-04-08 MED ORDER — HYDROMORPHONE HCL 1 MG/ML IJ SOLN
0.2500 mg | INTRAMUSCULAR | Status: DC | PRN
  Administered 2019-04-08 (×2): 0.5 mg via INTRAVENOUS

## 2019-04-08 MED ORDER — DIPHENHYDRAMINE HCL 12.5 MG/5ML PO ELIX
12.5000 mg | ORAL_SOLUTION | ORAL | Status: DC | PRN
  Filled 2019-04-08: qty 5

## 2019-04-08 MED ORDER — DEXAMETHASONE SODIUM PHOSPHATE 10 MG/ML IJ SOLN
INTRAMUSCULAR | Status: AC
  Filled 2019-04-08: qty 1

## 2019-04-08 MED ORDER — INSULIN ASPART 100 UNIT/ML ~~LOC~~ SOLN
30.0000 [IU] | Freq: Three times a day (TID) | SUBCUTANEOUS | Status: DC
  Administered 2019-04-08 – 2019-04-09 (×2): 30 [IU] via SUBCUTANEOUS

## 2019-04-08 MED ORDER — SUCCINYLCHOLINE CHLORIDE 200 MG/10ML IV SOSY
PREFILLED_SYRINGE | INTRAVENOUS | Status: AC
  Filled 2019-04-08: qty 10

## 2019-04-08 MED ORDER — INSULIN NPH (HUMAN) (ISOPHANE) 100 UNIT/ML ~~LOC~~ SUSP
40.0000 [IU] | SUBCUTANEOUS | Status: DC
  Filled 2019-04-08: qty 10

## 2019-04-08 MED ORDER — LACTATED RINGERS IV SOLN
INTRAVENOUS | Status: DC
  Administered 2019-04-08: 08:00:00 via INTRAVENOUS

## 2019-04-08 MED ORDER — INSULIN NPH (HUMAN) (ISOPHANE) 100 UNIT/ML ~~LOC~~ SUSP
75.0000 [IU] | Freq: Every day | SUBCUTANEOUS | Status: DC
  Filled 2019-04-08: qty 10

## 2019-04-08 MED ORDER — ACETAMINOPHEN 500 MG PO TABS
1000.0000 mg | ORAL_TABLET | Freq: Four times a day (QID) | ORAL | Status: AC
  Administered 2019-04-08 – 2019-04-09 (×4): 1000 mg via ORAL
  Filled 2019-04-08 (×4): qty 2

## 2019-04-08 MED ORDER — LIDOCAINE 2% (20 MG/ML) 5 ML SYRINGE
INTRAMUSCULAR | Status: AC
  Filled 2019-04-08: qty 5

## 2019-04-08 MED ORDER — MIDAZOLAM HCL 2 MG/2ML IJ SOLN
1.0000 mg | INTRAMUSCULAR | Status: DC
  Administered 2019-04-08: 09:00:00 1 mg via INTRAVENOUS
  Filled 2019-04-08: qty 2

## 2019-04-08 MED ORDER — BUPIVACAINE HCL (PF) 0.5 % IJ SOLN
INTRAMUSCULAR | Status: DC | PRN
  Administered 2019-04-08: 20 mL via PERINEURAL

## 2019-04-08 MED ORDER — HYDROMORPHONE HCL 1 MG/ML IJ SOLN
0.5000 mg | INTRAMUSCULAR | Status: DC | PRN
  Administered 2019-04-08: 15:00:00 1 mg via INTRAVENOUS
  Filled 2019-04-08: qty 1

## 2019-04-08 MED ORDER — ONDANSETRON HCL 4 MG PO TABS
4.0000 mg | ORAL_TABLET | Freq: Four times a day (QID) | ORAL | Status: DC | PRN
  Filled 2019-04-08: qty 1

## 2019-04-08 MED ORDER — SODIUM CHLORIDE 0.9 % IV BOLUS
500.0000 mL | Freq: Once | INTRAVENOUS | Status: AC
  Administered 2019-04-08: 500 mL via INTRAVENOUS

## 2019-04-08 MED ORDER — POLYETHYLENE GLYCOL 3350 17 G PO PACK
17.0000 g | PACK | Freq: Two times a day (BID) | ORAL | Status: DC
  Filled 2019-04-08 (×2): qty 1

## 2019-04-08 MED ORDER — METOCLOPRAMIDE HCL 5 MG/ML IJ SOLN
INTRAMUSCULAR | Status: AC
  Filled 2019-04-08: qty 2

## 2019-04-08 MED ORDER — ALUM & MAG HYDROXIDE-SIMETH 200-200-20 MG/5ML PO SUSP: 30.0000 mL | ORAL | Status: DC | PRN

## 2019-04-08 MED ORDER — DOCUSATE SODIUM 100 MG PO CAPS
100.0000 mg | ORAL_CAPSULE | Freq: Two times a day (BID) | ORAL | Status: DC
  Administered 2019-04-08 – 2019-04-09 (×2): 100 mg via ORAL
  Filled 2019-04-08 (×2): qty 1

## 2019-04-08 MED ORDER — ASPIRIN EC 325 MG PO TBEC
325.0000 mg | DELAYED_RELEASE_TABLET | Freq: Every day | ORAL | Status: DC
  Administered 2019-04-09: 325 mg via ORAL
  Filled 2019-04-08: qty 1

## 2019-04-08 MED ORDER — FENTANYL CITRATE (PF) 100 MCG/2ML IJ SOLN
50.0000 ug | INTRAMUSCULAR | Status: DC
  Administered 2019-04-08 (×2): 50 ug via INTRAVENOUS
  Filled 2019-04-08: qty 2

## 2019-04-08 MED ORDER — TRANEXAMIC ACID-NACL 1000-0.7 MG/100ML-% IV SOLN
1000.0000 mg | INTRAVENOUS | Status: AC
  Administered 2019-04-08: 1000 mg via INTRAVENOUS
  Filled 2019-04-08: qty 100

## 2019-04-08 MED ORDER — CHLORHEXIDINE GLUCONATE 4 % EX LIQD: 60.0000 mL | Freq: Once | CUTANEOUS | Status: DC

## 2019-04-08 MED ORDER — INSULIN NPH (HUMAN) (ISOPHANE) 100 UNIT/ML ~~LOC~~ SUSP
75.0000 [IU] | Freq: Every day | SUBCUTANEOUS | Status: DC
  Administered 2019-04-09: 75 [IU] via SUBCUTANEOUS
  Filled 2019-04-08: qty 10

## 2019-04-08 MED ORDER — INSULIN NPH (HUMAN) (ISOPHANE) 100 UNIT/ML ~~LOC~~ SUSP
65.0000 [IU] | Freq: Every day | SUBCUTANEOUS | Status: DC
  Administered 2019-04-08: 65 [IU] via SUBCUTANEOUS

## 2019-04-08 MED ORDER — FENTANYL CITRATE (PF) 100 MCG/2ML IJ SOLN
INTRAMUSCULAR | Status: AC
  Filled 2019-04-08: qty 2

## 2019-04-08 MED ORDER — SUGAMMADEX SODIUM 200 MG/2ML IV SOLN
INTRAVENOUS | Status: DC | PRN
  Administered 2019-04-08: 200 mg via INTRAVENOUS

## 2019-04-08 MED ORDER — ONDANSETRON HCL 4 MG/2ML IJ SOLN
INTRAMUSCULAR | Status: DC | PRN
  Administered 2019-04-08: 4 mg via INTRAVENOUS

## 2019-04-08 MED ORDER — WATER FOR IRRIGATION, STERILE IR SOLN
Status: DC | PRN
  Administered 2019-04-08: 2000 mL

## 2019-04-08 MED ORDER — PROPOFOL 10 MG/ML IV BOLUS
INTRAVENOUS | Status: DC | PRN
  Administered 2019-04-08: 150 mg via INTRAVENOUS

## 2019-04-08 MED ORDER — TRANEXAMIC ACID-NACL 1000-0.7 MG/100ML-% IV SOLN
1000.0000 mg | Freq: Once | INTRAVENOUS | Status: AC
  Administered 2019-04-08: 1000 mg via INTRAVENOUS
  Filled 2019-04-08: qty 100

## 2019-04-08 MED ORDER — HYDROMORPHONE HCL 1 MG/ML IJ SOLN
INTRAMUSCULAR | Status: DC | PRN
  Administered 2019-04-08 (×2): 0.5 mg via INTRAVENOUS

## 2019-04-08 MED ORDER — HYDROMORPHONE HCL 2 MG/ML IJ SOLN
INTRAMUSCULAR | Status: AC
  Filled 2019-04-08: qty 1

## 2019-04-08 MED ORDER — DEXAMETHASONE SODIUM PHOSPHATE 10 MG/ML IJ SOLN
8.0000 mg | Freq: Once | INTRAMUSCULAR | Status: AC
  Administered 2019-04-08: 8 mg via INTRAVENOUS

## 2019-04-08 MED ORDER — ALBUTEROL SULFATE (2.5 MG/3ML) 0.083% IN NEBU: 3.0000 mL | INHALATION_SOLUTION | Freq: Four times a day (QID) | RESPIRATORY_TRACT | Status: DC | PRN

## 2019-04-08 MED ORDER — SODIUM CHLORIDE 0.9 % IR SOLN
Status: DC | PRN
  Administered 2019-04-08: 1000 mL

## 2019-04-08 MED ORDER — PROMETHAZINE HCL 25 MG/ML IJ SOLN
12.5000 mg | Freq: Four times a day (QID) | INTRAMUSCULAR | Status: DC | PRN
  Administered 2019-04-08: 12.5 mg via INTRAVENOUS
  Filled 2019-04-08: qty 1

## 2019-04-08 MED ORDER — GABAPENTIN 300 MG PO CAPS
600.0000 mg | ORAL_CAPSULE | Freq: Every day | ORAL | Status: DC
  Administered 2019-04-08: 600 mg via ORAL
  Filled 2019-04-08: qty 2

## 2019-04-08 MED ORDER — PROPOFOL 10 MG/ML IV BOLUS
INTRAVENOUS | Status: AC
  Filled 2019-04-08: qty 60

## 2019-04-08 MED ORDER — ONDANSETRON HCL 4 MG/2ML IJ SOLN
4.0000 mg | Freq: Four times a day (QID) | INTRAMUSCULAR | Status: DC | PRN
  Filled 2019-04-08: qty 2

## 2019-04-08 MED ORDER — ONDANSETRON HCL 4 MG/2ML IJ SOLN
INTRAMUSCULAR | Status: AC
  Filled 2019-04-08: qty 2

## 2019-04-08 MED ORDER — SODIUM CHLORIDE 0.9 % IJ SOLN
INTRAMUSCULAR | Status: DC | PRN
  Administered 2019-04-08: 50 mL

## 2019-04-08 MED ORDER — MENTHOL 3 MG MT LOZG: 1.0000 | LOZENGE | OROMUCOSAL | Status: DC | PRN

## 2019-04-08 MED ORDER — BUPIVACAINE-EPINEPHRINE (PF) 0.25% -1:200000 IJ SOLN
INTRAMUSCULAR | Status: AC
  Filled 2019-04-08: qty 30

## 2019-04-08 MED ORDER — BUPIVACAINE-EPINEPHRINE 0.25% -1:200000 IJ SOLN
INTRAMUSCULAR | Status: DC | PRN
  Administered 2019-04-08: 30 mL

## 2019-04-08 MED ORDER — FENTANYL CITRATE (PF) 100 MCG/2ML IJ SOLN: 25.0000 ug | INTRAMUSCULAR | Status: DC | PRN

## 2019-04-08 MED ORDER — METOCLOPRAMIDE HCL 5 MG/ML IJ SOLN
5.0000 mg | Freq: Three times a day (TID) | INTRAMUSCULAR | Status: DC | PRN
  Administered 2019-04-08: 10 mg via INTRAVENOUS

## 2019-04-08 MED ORDER — PROMETHAZINE HCL 25 MG/ML IJ SOLN: 12.5000 mg | Freq: Four times a day (QID) | INTRAMUSCULAR | Status: DC | PRN

## 2019-04-08 MED ORDER — SODIUM CHLORIDE (PF) 0.9 % IJ SOLN
INTRAMUSCULAR | Status: AC
  Filled 2019-04-08: qty 50

## 2019-04-08 MED ORDER — HYDROMORPHONE HCL 1 MG/ML IJ SOLN
0.2500 mg | INTRAMUSCULAR | Status: DC | PRN
  Administered 2019-04-08 (×2): 0.25 mg via INTRAVENOUS
  Administered 2019-04-08: 0.5 mg via INTRAVENOUS

## 2019-04-08 MED ORDER — DEXAMETHASONE SODIUM PHOSPHATE 10 MG/ML IJ SOLN
10.0000 mg | Freq: Three times a day (TID) | INTRAMUSCULAR | Status: AC
  Administered 2019-04-08 – 2019-04-09 (×2): 10 mg via INTRAVENOUS
  Filled 2019-04-08 (×2): qty 1

## 2019-04-08 MED ORDER — HYDROMORPHONE HCL 1 MG/ML IJ SOLN
INTRAMUSCULAR | Status: AC
  Administered 2019-04-08: 0.25 mg via INTRAVENOUS
  Filled 2019-04-08: qty 2

## 2019-04-08 MED ORDER — INSULIN NPH (HUMAN) (ISOPHANE) 100 UNIT/ML ~~LOC~~ SUSP: 40.0000 [IU] | Freq: Every day | SUBCUTANEOUS | Status: DC

## 2019-04-08 MED ORDER — LIDOCAINE 2% (20 MG/ML) 5 ML SYRINGE
INTRAMUSCULAR | Status: DC | PRN
  Administered 2019-04-08: 100 mg via INTRAVENOUS

## 2019-04-08 MED ORDER — PROMETHAZINE HCL 25 MG/ML IJ SOLN: 6.2500 mg | INTRAMUSCULAR | Status: DC | PRN

## 2019-04-08 MED ORDER — POVIDONE-IODINE 10 % EX SWAB: 2.0000 "application " | Freq: Once | CUTANEOUS | Status: DC

## 2019-04-08 MED ORDER — INSULIN NPH (HUMAN) (ISOPHANE) 100 UNIT/ML ~~LOC~~ SUSP
65.0000 [IU] | Freq: Every day | SUBCUTANEOUS | Status: DC
  Filled 2019-04-08: qty 10

## 2019-04-08 MED ORDER — OXYCODONE HCL 5 MG PO TABS
5.0000 mg | ORAL_TABLET | ORAL | Status: DC | PRN
  Administered 2019-04-08 – 2019-04-09 (×4): 10 mg via ORAL
  Filled 2019-04-08 (×4): qty 2

## 2019-04-08 MED ORDER — BUPIVACAINE LIPOSOME 1.3 % IJ SUSP
INTRAMUSCULAR | Status: DC | PRN
  Administered 2019-04-08: 20 mL

## 2019-04-08 MED ORDER — FENTANYL CITRATE (PF) 250 MCG/5ML IJ SOLN
INTRAMUSCULAR | Status: DC | PRN
  Administered 2019-04-08: 100 ug via INTRAVENOUS
  Administered 2019-04-08 (×2): 50 ug via INTRAVENOUS

## 2019-04-08 MED ORDER — METOCLOPRAMIDE HCL 5 MG PO TABS
5.0000 mg | ORAL_TABLET | Freq: Three times a day (TID) | ORAL | Status: DC | PRN
  Filled 2019-04-08: qty 2

## 2019-04-08 MED ORDER — ONDANSETRON HCL 4 MG/2ML IJ SOLN
INTRAMUSCULAR | Status: AC
  Administered 2019-04-08: 4 mg via INTRAVENOUS
  Filled 2019-04-08: qty 2

## 2019-04-08 MED ORDER — POTASSIUM CHLORIDE IN NACL 20-0.9 MEQ/L-% IV SOLN
INTRAVENOUS | Status: DC
  Administered 2019-04-08: 16:00:00 via INTRAVENOUS
  Filled 2019-04-08 (×2): qty 1000

## 2019-04-08 MED ORDER — PROMETHAZINE HCL 25 MG PO TABS: 12.5000 mg | ORAL_TABLET | Freq: Four times a day (QID) | ORAL | Status: DC | PRN

## 2019-04-08 MED ORDER — VANCOMYCIN HCL IN DEXTROSE 1-5 GM/200ML-% IV SOLN
1000.0000 mg | Freq: Two times a day (BID) | INTRAVENOUS | Status: AC
  Administered 2019-04-08: 1000 mg via INTRAVENOUS
  Filled 2019-04-08: qty 200

## 2019-04-08 MED ORDER — CLONIDINE HCL (ANALGESIA) 100 MCG/ML EP SOLN
EPIDURAL | Status: DC | PRN
  Administered 2019-04-08: 70 ug

## 2019-04-08 MED ORDER — ROCURONIUM BROMIDE 10 MG/ML (PF) SYRINGE
PREFILLED_SYRINGE | INTRAVENOUS | Status: DC | PRN
  Administered 2019-04-08: 30 mg via INTRAVENOUS
  Administered 2019-04-08: 10 mg via INTRAVENOUS

## 2019-04-08 MED ORDER — SUCCINYLCHOLINE CHLORIDE 200 MG/10ML IV SOSY
PREFILLED_SYRINGE | INTRAVENOUS | Status: DC | PRN
  Administered 2019-04-08: 140 mg via INTRAVENOUS

## 2019-04-08 SURGICAL SUPPLY — 55 items
ATTUNE MED DOME PAT 32 KNEE (Knees) ×1 IMPLANT
ATTUNE PS FEM LT SZ 4 CEM KNEE (Femur) ×1 IMPLANT
ATTUNE PSRP INSR SZ4 7 KNEE (Insert) ×1 IMPLANT
BAG ZIPLOCK 12X15 (MISCELLANEOUS) ×2 IMPLANT
BASEPLATE TIBIAL ROTATING SZ 4 (Knees) ×1 IMPLANT
BLADE SAGITTAL 25.0X1.19X90 (BLADE) ×2 IMPLANT
BLADE SAW SGTL 13X75X1.27 (BLADE) ×2 IMPLANT
BNDG ELASTIC 6X10 VLCR STRL LF (GAUZE/BANDAGES/DRESSINGS) ×2 IMPLANT
BOWL SMART MIX CTS (DISPOSABLE) ×2 IMPLANT
CEMENT HV SMART SET (Cement) ×4 IMPLANT
CHLORAPREP W/TINT 26 (MISCELLANEOUS) ×4 IMPLANT
COVER SURGICAL LIGHT HANDLE (MISCELLANEOUS) ×2 IMPLANT
COVER WAND RF STERILE (DRAPES) IMPLANT
CUFF TOURN SGL QUICK 34 (TOURNIQUET CUFF) ×1
CUFF TRNQT CYL 34X4.125X (TOURNIQUET CUFF) ×1 IMPLANT
DECANTER SPIKE VIAL GLASS SM (MISCELLANEOUS) ×4 IMPLANT
DRAPE ORTHO SPLIT 77X108 STRL (DRAPES) ×1
DRAPE SHEET LG 3/4 BI-LAMINATE (DRAPES) ×2 IMPLANT
DRAPE SURG ORHT 6 SPLT 77X108 (DRAPES) ×1 IMPLANT
DRAPE U-SHAPE 47X51 STRL (DRAPES) ×2 IMPLANT
DRSG AQUACEL AG ADV 3.5X10 (GAUZE/BANDAGES/DRESSINGS) ×2 IMPLANT
ELECT CAUTERY BLADE TIP 2.5 (TIP) ×2
ELECT REM PT RETURN 15FT ADLT (MISCELLANEOUS) ×2 IMPLANT
ELECTRODE CAUTERY BLDE TIP 2.5 (TIP) ×1 IMPLANT
GLOVE BIOGEL PI IND STRL 7.0 (GLOVE) ×1 IMPLANT
GLOVE BIOGEL PI IND STRL 7.5 (GLOVE) ×1 IMPLANT
GLOVE BIOGEL PI INDICATOR 7.0 (GLOVE) ×1
GLOVE BIOGEL PI INDICATOR 7.5 (GLOVE) ×1
GOWN STRL REUS W/ TWL LRG LVL3 (GOWN DISPOSABLE) ×2 IMPLANT
GOWN STRL REUS W/TWL LRG LVL3 (GOWN DISPOSABLE) ×2
HANDPIECE INTERPULSE COAX TIP (DISPOSABLE) ×1
HOLDER FOLEY CATH W/STRAP (MISCELLANEOUS) IMPLANT
HOOD PEEL AWAY FLYTE STAYCOOL (MISCELLANEOUS) ×6 IMPLANT
KIT TURNOVER KIT A (KITS) ×2 IMPLANT
MANIFOLD NEPTUNE II (INSTRUMENTS) ×2 IMPLANT
MARKER SKIN DUAL TIP RULER LAB (MISCELLANEOUS) ×2 IMPLANT
NEEDLE HYPO 22GX1.5 SAFETY (NEEDLE) ×2 IMPLANT
NS IRRIG 1000ML POUR BTL (IV SOLUTION) ×2 IMPLANT
PACK TOTAL KNEE CUSTOM (KITS) ×2 IMPLANT
PIN STEINMAN FIXATION KNEE (PIN) ×1 IMPLANT
PROTECTOR NERVE ULNAR (MISCELLANEOUS) ×2 IMPLANT
SET HNDPC FAN SPRY TIP SCT (DISPOSABLE) ×1 IMPLANT
STAPLER VISISTAT 35W (STAPLE) IMPLANT
STRIP CLOSURE SKIN 1/2X4 (GAUZE/BANDAGES/DRESSINGS) ×2 IMPLANT
SUT MNCRL AB 3-0 PS2 18 (SUTURE) ×2 IMPLANT
SUT VIC AB 0 CT1 36 (SUTURE) ×4 IMPLANT
SUT VIC AB 1 CT1 36 (SUTURE) ×2 IMPLANT
SUT VIC AB 2-0 CT1 27 (SUTURE) ×2
SUT VIC AB 2-0 CT1 TAPERPNT 27 (SUTURE) ×2 IMPLANT
SYR CONTROL 10ML LL (SYRINGE) ×4 IMPLANT
TRAY FOLEY MTR SLVR 14FR STAT (SET/KITS/TRAYS/PACK) ×1 IMPLANT
WATER STERILE IRR 1000ML POUR (IV SOLUTION) ×4 IMPLANT
WRAP KNEE MAXI GEL POST OP (GAUZE/BANDAGES/DRESSINGS) ×1 IMPLANT
YANKAUER SUCT BULB TIP 10FT TU (MISCELLANEOUS) ×2 IMPLANT
YANKAUER SUCT BULB TIP NO VENT (SUCTIONS) ×2 IMPLANT

## 2019-04-08 NOTE — Evaluation (Signed)
Physical Therapy Evaluation Patient Details Name: Janice [name] MRN: [id number] DOB: 1951/01/19 Today's Date: 04/08/2019   History of Present Illness  68 yo female s/p L TKR on 04/08/19. PMH includes lumbar DDD, OSA, diverticulitis, HTN, DM with peripheral neuropathy, HLD, bilateral RTC repair.  Clinical Impression   Pt presents with L knee pain, decreased L knee ROM, difficulty performing mobility tasks, and decreased activity tolerance due to L knee pain and fatigue. Pt to benefit from acute PT to address deficits. Pt ambulated 5 ft with RW with min assist for steadying and L knee support, pt limited by pain, anxiety/shaking. Verbal cuing for placement in RW and sequencing provided throughout. Pt educated on ankle pumps (20/hour) to perform this afternoon/evening to increase circulation, to pt's tolerance and limited by pain. PT to progress mobility as tolerated, and will continue to follow acutely.        Follow Up Recommendations Follow surgeon's recommendation for DC plan and follow-up therapies;Supervision for mobility/OOB    Equipment Recommendations  None recommended by PT    Recommendations for Other Services       Precautions / Restrictions Precautions Precautions: Fall Restrictions Weight Bearing Restrictions: No Other Position/Activity Restrictions: WBAT      Mobility  Bed Mobility Overal bed mobility: Needs Assistance Bed Mobility: Supine to Sit     Supine to sit: Min assist;+2 for physical assistance;HOB elevated     General bed mobility comments: Min assist +2 for LE management, trunk elevation. Pt with use of bed rails to come to sitting.  Transfers Overall transfer level: Needs assistance Equipment used: Rolling walker (2 wheeled) Transfers: Sit to/from Stand Sit to Stand: Min assist;From elevated surface;+2 safety/equipment         General transfer comment: Min assist for power up, steadying. Increased time to rise, difficulty performing hip  extension to come to full standing.  Ambulation/Gait Ambulation/Gait assistance: Min assist;+2 safety/equipment Gait Distance (Feet): 5 Feet Assistive device: Rolling walker (2 wheeled) Gait Pattern/deviations: Step-to pattern;Decreased step length - right;Decreased step length - left;Decreased weight shift to left;Antalgic Gait velocity: decr   General Gait Details: Min assist for steadying, stabilizing L knee in stance phase. Verbal cuing for placement in RW, sequencing.  Stairs            Wheelchair Mobility    Modified Rankin (Stroke Patients Only)       Balance Overall balance assessment: Mild deficits observed, not formally tested                                           Pertinent Vitals/Pain Pain Assessment: 0-10 Pain Score: 10-Worst pain ever Pain Location: L knee, post-ambulation Pain Descriptors / Indicators: Sore Pain Intervention(s): Monitored during session;Limited activity within patient's tolerance;Premedicated before session;Repositioned;Ice applied    Home Living Family/patient expects to be discharged to:: Private residence Living Arrangements: Spouse/significant other Available Help at Discharge: Family;Available 24 hours/day Type of Home: House Home Access: Stairs to enter Entrance Stairs-Rails: Right Entrance Stairs-Number of Steps: 2 Home Layout: One level Home Equipment: Walker - 2 wheels;Bedside commode;Cane - single point      Prior Function Level of Independence: Independent with assistive device(s)         Comments: pt reports using cane and RW PRN PTA.     Hand Dominance   Dominant Hand: Right    Extremity/Trunk Assessment   Upper Extremity Assessment Upper  Extremity Assessment: Overall WFL for tasks assessed    Lower Extremity Assessment Lower Extremity Assessment: Generalized weakness;LLE deficits/detail LLE Deficits / Details: suspected post-surgical weakness; able to perform ankle pumps, quad set,  heel slide to ~45* limited by pain and stiffness, unable to perform SLR LLE Sensation: WNL    Cervical / Trunk Assessment Cervical / Trunk Assessment: Normal  Communication   Communication: No difficulties  Cognition Arousal/Alertness: Awake/alert Behavior During Therapy: WFL for tasks assessed/performed;Anxious Overall Cognitive Status: Within Functional Limits for tasks assessed                                 General Comments: Pt shaking from suspected pain and anxiety      General Comments      Exercises     Assessment/Plan    PT Assessment Patient needs continued PT services  PT Problem List Decreased strength;Decreased mobility;Decreased range of motion;Decreased activity tolerance;Decreased balance;Decreased knowledge of use of DME;Pain       PT Treatment Interventions DME instruction;Therapeutic activities;Gait training;Therapeutic exercise;Patient/family education;Balance training;Stair training;Functional mobility training    PT Goals (Current goals can be found in the Care Plan section)  Acute Rehab PT Goals Patient Stated Goal: go home tomorrow PT Goal Formulation: With patient Time For Goal Achievement: 04/15/19 Potential to Achieve Goals: Good    Frequency 7X/week   Barriers to discharge        Co-evaluation               AM-PAC PT "6 Clicks" Mobility  Outcome Measure Help needed turning from your back to your side while in a flat bed without using bedrails?: A Little Help needed moving from lying on your back to sitting on the side of a flat bed without using bedrails?: A Little Help needed moving to and from a bed to a chair (including a wheelchair)?: A Little Help needed standing up from a chair using your arms (e.g., wheelchair or bedside chair)?: A Little Help needed to walk in hospital room?: A Little Help needed climbing 3-5 steps with a railing? : A Lot 6 Click Score: 17    End of Session Equipment Utilized During  Treatment: Gait belt Activity Tolerance: Patient tolerated treatment well Patient left: in chair;with chair alarm set;with SCD's reapplied;with call bell/phone within reach Nurse Communication: Mobility status PT Visit Diagnosis: Other abnormalities of gait and mobility (R26.89);Difficulty in walking, not elsewhere classified (R26.2)    Time: 5784-6962 PT Time Calculation (min) (ACUTE ONLY): 17 min   Charges:   PT Evaluation $PT Eval Low Complexity: 1 Low          Julien Girt, PT Acute Rehabilitation Services Pager [phone]  Office [phone]   Roxine Caddy D Elonda Husky 04/08/2019, 6:13 PM

## 2019-04-08 NOTE — Anesthesia Procedure Notes (Signed)
Anesthesia Regional Block: Adductor canal block   Pre-Anesthetic Checklist: ,, timeout performed, Correct Patient, Correct Site, Correct Laterality, Correct Procedure, Correct Position, site marked, Risks and benefits discussed,  Surgical consent,  Pre-op evaluation,  At surgeon's request and post-op pain management  Laterality: Left  Prep: chloraprep       Needles:  Injection technique: Single-shot  Needle Type: Echogenic Needle     Needle Length: 9cm      Additional Needles:   Procedures:,,,, ultrasound used (permanent image in chart),,,,  Narrative:  Start time: 04/08/2019 9:00 AM End time: 04/08/2019 9:07 AM Injection made incrementally with aspirations every 5 mL.  Performed by: Personally  Anesthesiologist: Myrtie Soman, MD  Additional Notes: Patient tolerated the procedure well without complications

## 2019-04-08 NOTE — Transfer of Care (Signed)
Immediate Anesthesia Transfer of Care Note  Patient: Emmalyn Hinson Obando  Procedure(s) Performed: TOTAL KNEE ARTHROPLASTY (Left Knee)  Patient Location: PACU  Anesthesia Type:General  Level of Consciousness: awake, alert  and oriented  Airway & Oxygen Therapy: Patient Spontanous Breathing and Patient connected to face mask oxygen  Post-op Assessment: Report given to RN and Post -op Vital signs reviewed and stable  Post vital signs: Reviewed and stable  Last Vitals:  Vitals Value Taken Time  BP    Temp    Pulse 92 04/08/19 1152  Resp 14 04/08/19 1152  SpO2 99 % 04/08/19 1152  Vitals shown include unvalidated device data.  Last Pain:  Vitals:   04/08/19 0908  TempSrc:   PainSc: 0-No pain         Complications: No apparent anesthesia complications

## 2019-04-08 NOTE — Progress Notes (Signed)
Assisted Dr. Rose with left, ultrasound guided, adductor canal block. Side rails up, monitors on throughout procedure. See vital signs in flow sheet. Tolerated Procedure well.  

## 2019-04-08 NOTE — Op Note (Signed)
MRN:     [id number] DOB/AGE:    68-May-1952 / 68 y.o.       OPERATIVE REPORT   DATE OF PROCEDURE:  04/08/2019      PREOPERATIVE DIAGNOSIS:   Primary Localized Osteoarthritis left Knee       Estimated body mass index is 39 kg/m as calculated from the following:   Height as of this encounter: 5\' 4"  (1.626 m).   Weight as of this encounter: 103.1 kg.                                                       POSTOPERATIVE DIAGNOSIS:   Same                                                                 PROCEDURE:  Procedure(s): TOTAL KNEE ARTHROPLASTY Using Depuy Attune RP implants #4 Femur, #4Tibia, 56mm  RP bearing, 32 Patella    SURGEON: Kaysie Michelini A. Noemi Chapel, MD   ASSISTANT: Matthew Saras, PA-C, present and scrubbed throughout the case, critical for retraction, instrumentation, and closure.  ANESTHESIA: Spinal with Adductor Nerve Block  TOURNIQUET TIME: 56 minutes   COMPLICATIONS:  None       SPECIMENS: None   INDICATIONS FOR PROCEDURE: The patient has djd of the knee with varus deformities, XR shows bone on bone arthritis. Patient has failed all conservative measures including anti-inflammatory medicines, narcotics, attempts at exercise and weight loss, cortisone injections and viscosupplementation.  Risks and benefits of surgery have been discussed, questions answered.    DESCRIPTION OF PROCEDURE: The patient identified by armband, received right adductor canal block and IV antibiotics, in the holding area at [facility]. Patient taken to the operating room, appropriate anesthetic monitors were attached. Spinal anesthesia induced with the patient in supine position, Foley catheter was inserted. Tourniquet applied high to the operative thigh. Lateral post and foot positioner applied to the table, the lower extremity was then prepped and draped in usual sterile fashion from the ankle to the tourniquet. Time-out procedure was performed. The limb was wrapped with an Esmarch bandage and  the tourniquet inflated to 365 mmHg.   We began the operation by making a 6cm anterior midline incision. Small bleeders in the skin and the subcutaneous tissue identified and cauterized. Transverse retinaculum was incised and reflected medially and a medial parapatellar arthrotomy was accomplished. the patella was everted and theprepatellar fat pad resected. The superficial medial collateral ligament was then elevated from anterior to posterior along the proximal flare of the tibia and anterior half of the menisci resected. The knee was hyperflexed exposing bone on bone arthritis. Peripheral and notch osteophytes as well as the cruciate ligaments were then resected. We continued to work our way around posteriorly along the proximal tibia, and externally rotated the tibia subluxing it out from underneath the femur. A McHale retractor was placed through the notch and a lateral Hohmann retractor placed, and an external tibial guide was placed.  The tibial cutting guide was pinned into place allowing resection of 4 mm of bone medially and about 6 mm of bone laterally because of her  varus deformity.   Satisfied with the tibial resection, we then entered the distal femur 2 mm anterior to the PCL origin with the intramedullary guide rod and applied the distal femoral cutting guide set at 40mm, with 5 degrees of valgus. This was pinned along the epicondylar axis. At this point, the distal femoral cut was accomplished without difficulty. We then sized for a 4 femoral component and pinned the guide in 3 degrees of external rotation.The chamfer cutting guide was pinned into place. The anterior, posterior, and chamfer cuts were accomplished without difficulty followed by the  RP box cutting guide and the box cut. We also removed posterior osteophytes from the posterior femoral condyles. At this time, the knee was brought into full extension. We checked our extension and flexion gaps and found them symmetric at 7.  The  patella thickness measured at 81m m. We set the cutting guide at 15 and removed the posterior patella sized for 32 button and drilled the lollipop. The knee was then once again hyperflexed exposing the proximal tibia. We sized for a # 4 tibial base plate, applied the smokestack and the conical reamer followed by the the Delta fin keel punch. We then hammered into place the  RP trial femoral component, inserted a trial bearing, trial patellar button, and took the knee through range of motion from 0-130 degrees. No thumb pressure was required for patellar tracking.   At this point, all trial components were removed, a double batch of DePuy HV cement  was mixed and applied to all bony metallic mating surfaces. In order, we hammered into place the tibial tray and removed excess cement, the femoral component and removed excess cement, a 7 mm  RP bearing was inserted, and the knee brought to full extension with compression. The patellar button was clamped into place, and excess cement removed. While the cement cured the wound was irrigated out with normal saline solution pulse lavage, and exparel was injected throughout the knee. Ligament stability and patellar tracking were checked and found to be excellent..   The parapatellar arthrotomy was closed with  #1 Vicryl suture. The subcutaneous tissue with 0 and 2-0 undyed Vicryl suture, and 4-0 Monocryl.. A dressing of Aquaseal, 4 x 4, dressing sponges, Webril, and Ace wrap applied. Needle and sponge count were correct times 2.The patient awakened, extubated, and taken to recovery room without difficulty. Vascular status was normal, pulses 2+ and symmetric.    Janice [name] 12/04/2017, 8:56 AM

## 2019-04-08 NOTE — Anesthesia Procedure Notes (Signed)
Anesthesia Procedure Image    

## 2019-04-08 NOTE — Progress Notes (Signed)
Patient CBG 256

## 2019-04-08 NOTE — Interval H&P Note (Signed)
History and Physical Interval Note:  04/08/2019 8:59 AM  Janice [name]  has presented today for surgery, with the diagnosis of djd left knee.  The various methods of treatment have been discussed with the patient and family. After consideration of risks, benefits and other options for treatment, the patient has consented to  Procedure(s): TOTAL KNEE ARTHROPLASTY (Left) as a surgical intervention.  The patient's history has been reviewed, patient examined, no change in status, stable for surgery.  I have reviewed the patient's chart and labs.  Questions were answered to the patient's satisfaction.     Lorn Junes

## 2019-04-08 NOTE — Anesthesia Procedure Notes (Signed)
Procedure Name: Intubation Date/Time: 04/08/2019 9:56 AM Performed by: Eben Burow, CRNA Pre-anesthesia Checklist: Patient identified, Emergency Drugs available, Suction available, Patient being monitored and Timeout performed Patient Re-evaluated:Patient Re-evaluated prior to induction Oxygen Delivery Method: Circle system utilized Preoxygenation: Pre-oxygenation with 100% oxygen Induction Type: IV induction Ventilation: Mask ventilation without difficulty Laryngoscope Size: Mac and 4 Grade View: Grade II Tube type: Oral Tube size: 7.0 mm Number of attempts: 1 Airway Equipment and Method: Stylet Placement Confirmation: ETT inserted through vocal cords under direct vision,  positive ETCO2 and breath sounds checked- equal and bilateral Secured at: 22 cm Tube secured with: Tape Dental Injury: Teeth and Oropharynx as per pre-operative assessment

## 2019-04-09 ENCOUNTER — Encounter (HOSPITAL_COMMUNITY): Payer: Self-pay | Admitting: Orthopedic Surgery

## 2019-04-09 DIAGNOSIS — E1142 Type 2 diabetes mellitus with diabetic polyneuropathy: Secondary | ICD-10-CM | POA: Diagnosis not present

## 2019-04-09 DIAGNOSIS — D509 Iron deficiency anemia, unspecified: Secondary | ICD-10-CM | POA: Diagnosis not present

## 2019-04-09 DIAGNOSIS — E669 Obesity, unspecified: Secondary | ICD-10-CM | POA: Diagnosis not present

## 2019-04-09 DIAGNOSIS — K219 Gastro-esophageal reflux disease without esophagitis: Secondary | ICD-10-CM | POA: Diagnosis not present

## 2019-04-09 DIAGNOSIS — I10 Essential (primary) hypertension: Secondary | ICD-10-CM | POA: Diagnosis not present

## 2019-04-09 DIAGNOSIS — G4733 Obstructive sleep apnea (adult) (pediatric): Secondary | ICD-10-CM | POA: Diagnosis not present

## 2019-04-09 DIAGNOSIS — Z96652 Presence of left artificial knee joint: Secondary | ICD-10-CM | POA: Diagnosis not present

## 2019-04-09 DIAGNOSIS — E785 Hyperlipidemia, unspecified: Secondary | ICD-10-CM | POA: Diagnosis not present

## 2019-04-09 DIAGNOSIS — M1712 Unilateral primary osteoarthritis, left knee: Secondary | ICD-10-CM | POA: Diagnosis not present

## 2019-04-09 LAB — GLUCOSE, CAPILLARY
Glucose-Capillary: 291 mg/dL — ABNORMAL HIGH (ref 70–99)
Glucose-Capillary: 318 mg/dL — ABNORMAL HIGH (ref 70–99)
Glucose-Capillary: 389 mg/dL — ABNORMAL HIGH (ref 70–99)

## 2019-04-09 LAB — URINE CULTURE: Culture: NO GROWTH

## 2019-04-09 LAB — BASIC METABOLIC PANEL
Anion gap: 8 (ref 5–15)
BUN: 15 mg/dL (ref 8–23)
CO2: 23 mmol/L (ref 22–32)
Calcium: 8.5 mg/dL — ABNORMAL LOW (ref 8.9–10.3)
Chloride: 99 mmol/L (ref 98–111)
Creatinine, Ser: 0.9 mg/dL (ref 0.44–1.00)
GFR calc Af Amer: >60 mL/min (ref >60)
GFR calc non Af Amer: >60 mL/min (ref >60)
Glucose, Bld: 320 mg/dL — ABNORMAL HIGH (ref 70–99)
Potassium: 5.7 mmol/L — ABNORMAL HIGH (ref 3.5–5.1)
Sodium: 130 mmol/L — ABNORMAL LOW (ref 135–145)

## 2019-04-09 LAB — CBC
HCT: 35.3 % — ABNORMAL LOW (ref 36.0–46.0)
Hemoglobin: 10.6 g/dL — ABNORMAL LOW (ref 12.0–15.0)
MCH: 27 pg (ref 26.0–34.0)
MCHC: 30 g/dL (ref 30.0–36.0)
MCV: 90.1 fL (ref 80.0–100.0)
Platelets: 79 10*3/uL — ABNORMAL LOW (ref 150–400)
RBC: 3.92 MIL/uL (ref 3.87–5.11)
RDW: 14.5 % (ref 11.5–15.5)
WBC: 16.6 10*3/uL — ABNORMAL HIGH (ref 4.0–10.5)
nRBC: 0 % (ref 0.0–0.2)

## 2019-04-09 MED ORDER — POLYETHYLENE GLYCOL 3350 17 G PO PACK: PACK | ORAL | 0 refills | Status: DC

## 2019-04-09 MED ORDER — ASPIRIN 325 MG PO TBEC: DELAYED_RELEASE_TABLET | ORAL | 0 refills | Status: DC

## 2019-04-09 MED ORDER — DOCUSATE SODIUM 100 MG PO CAPS: ORAL_CAPSULE | ORAL | 0 refills | Status: DC

## 2019-04-09 MED ORDER — OXYCODONE HCL 5 MG PO TABS: 5.0000 mg | ORAL_TABLET | ORAL | 0 refills | Status: DC | PRN

## 2019-04-09 NOTE — Plan of Care (Signed)
Plan of care 

## 2019-04-09 NOTE — Anesthesia Postprocedure Evaluation (Signed)
Anesthesia Post Note  Patient: Janice [name]  Procedure([name]) Performed: TOTAL KNEE ARTHROPLASTY (Left Knee)     Patient location during evaluation: PACU Anesthesia Type: General Level of consciousness: awake and alert Pain management: pain level controlled Vital Signs Assessment: post-procedure vital signs reviewed and stable Respiratory status: spontaneous breathing, nonlabored ventilation, respiratory function stable and patient connected to nasal cannula oxygen Cardiovascular status: blood pressure returned to baseline and stable Postop Assessment: no apparent nausea or vomiting Anesthetic complications: no    Last Vitals:  Vitals:   04/09/19 0420 04/09/19 0600  BP: (!) 136/52   Pulse: 73   Resp: 14   Temp: 36.7 C   SpO2: 100% 99%    Last Pain:  Vitals:   04/09/19 0607  TempSrc:   PainSc: 5                  Janice [name]

## 2019-04-09 NOTE — TOC Transition Note (Signed)
Transition of Care [facility]) - CM/SW Discharge Note   Patient Details  Name: Janice [name] MRN: [id number] Date of Birth: Jan 13, 1951  Transition of Care [facility]) CM/SW Contact:  Lia Hopping, [place] Phone Number: 04/09/2019, 11:08 AM   Clinical Narrative:    [place] services arranged.  Patient has DME( 3 in 1 and RW)   Final next level of care: [place] Barriers to Discharge: No Barriers Identified   Patient Goals and CMS Choice Patient states their goals for this hospitalization and ongoing recovery are:: Recover at Home CMS Medicare.gov Compare Post Acute Care list provided to:: Patient Choice offered to / list presented to : Patient  Discharge Placement  Home                      Discharge Plan and Services                          HH Arranged: PT [facility] Agency: [facility] Date [place]: 04/09/19 Time [place]: 7017    Social Determinants of Health (SDOH) Interventions     Readmission Risk Interventions No flowsheet data found.

## 2019-04-09 NOTE — Progress Notes (Signed)
Physical Therapy Treatment Patient Details Name: Janice [name] MRN: [id number] DOB: November 04, 1950 Today's Date: 04/09/2019    History of Present Illness 68 yo female s/p L TKR on 04/08/19. PMH includes lumbar DDD, OSA, diverticulitis, HTN, DM with peripheral neuropathy, HLD, bilateral RTC repair.    PT Comments    POD # 1 am session Pt OOB in recliner and dressed.  Pt amb the hallway with PA earlier.  Amb a shorter distance.  Then returned to room to perform some TE's following HEP handout.  Instructed on proper tech, freq as well as use of ICE.   Assisted to bathroom.  Pt will need another session to address stairs.   Follow Up Recommendations  Follow surgeon's recommendation for DC plan and follow-up therapies;Supervision for mobility/OOB     Equipment Recommendations  None recommended by PT    Recommendations for Other Services       Precautions / Restrictions Precautions Precautions: Fall;Knee Precaution Comments: instructed on no pillow under knee    Mobility  Bed Mobility               General bed mobility comments: OOB in recliner  Transfers Overall transfer level: Needs assistance Equipment used: Rolling walker (2 wheeled) Transfers: Sit to/from Stand Sit to Stand: Supervision;Min guard         General transfer comment: 25% VC's on proper hand placement and safety with turns  Ambulation/Gait Ambulation/Gait assistance: Supervision;Min guard Gait Distance (Feet): 18 Feet Assistive device: Rolling walker (2 wheeled) Gait Pattern/deviations: Step-to pattern;Decreased step length - right;Decreased step length - left;Decreased weight shift to left;Antalgic Gait velocity: decr   General Gait Details: Pt amb the unit earlier with PA so did not amb a great distance.  25% VC's on safety with turns and proper walker to self distance.   Stairs             Wheelchair Mobility    Modified Rankin (Stroke Patients Only)       Balance                                            Cognition Arousal/Alertness: Awake/alert Behavior During Therapy: WFL for tasks assessed/performed;Anxious Overall Cognitive Status: Within Functional Limits for tasks assessed                                        Exercises   Total Knee Replacement TE's 10 reps B LE ankle pumps 10 reps towel squeezes 10 reps knee presses 10 reps heel slides  10 reps SAQ's 10 reps SLR's 10 reps ABD Followed by ICE     General Comments        Pertinent Vitals/Pain Pain Assessment: 0-10 Pain Score: 5  Pain Location: L knee Pain Descriptors / Indicators: Sore;Tightness;Tender Pain Intervention(s): Monitored during session;Premedicated before session;Repositioned;Ice applied    Home Living                      Prior Function            PT Goals (current goals can now be found in the care plan section) Progress towards PT goals: Progressing toward goals    Frequency    7X/week      PT Plan      Co-evaluation  AM-PAC PT "6 Clicks" Mobility   Outcome Measure  Help needed turning from your back to your side while in a flat bed without using bedrails?: A Little Help needed moving from lying on your back to sitting on the side of a flat bed without using bedrails?: A Little Help needed moving to and from a bed to a chair (including a wheelchair)?: A Little Help needed standing up from a chair using your arms (e.g., wheelchair or bedside chair)?: A Little Help needed to walk in hospital room?: A Little Help needed climbing 3-5 steps with a railing? : A Lot 6 Click Score: 17    End of Session Equipment Utilized During Treatment: Gait belt Activity Tolerance: Patient tolerated treatment well Patient left: in chair;with call bell/phone within reach Nurse Communication: Mobility status PT Visit Diagnosis: Other abnormalities of gait and mobility (R26.89);Difficulty in walking, not  elsewhere classified (R26.2)     Time: 2334-3568 PT Time Calculation (min) (ACUTE ONLY): 43 min  Charges:  $Gait Training: 8-22 mins $Therapeutic Exercise: 8-22 mins $Therapeutic Activity: 8-22 mins                     Rica Koyanagi  PTA Acute  Rehabilitation Services Pager      [phone] Office      [phone]

## 2019-04-09 NOTE — Progress Notes (Signed)
Physical Therapy Treatment Patient Details Name: Janice [name] MRN: [id number] DOB: 1951/07/27 Today's Date: 04/09/2019    History of Present Illness 68 yo female s/p L TKR on 04/08/19. PMH includes lumbar DDD, OSA, diverticulitis, HTN, DM with peripheral neuropathy, HLD, bilateral RTC repair.    PT Comments    POD # 1 pm session  Practiced stairs.  General stair comments: 50% VC's on proper walker placement up forward with walker.  Videoed on pt's personal cell for spouse to see.  Addressed all mobility questions, discussed appropriate activity, educated on use of ICE.  Pt ready for D/C to home.   Follow Up Recommendations  Follow surgeon's recommendation for DC plan and follow-up therapies;Supervision for mobility/OOB     Equipment Recommendations  None recommended by PT    Recommendations for Other Services       Precautions / Restrictions Precautions Precautions: Fall;Knee Precaution Comments: instructed on no pillow under knee    Mobility  Bed Mobility               General bed mobility comments: OOB in recliner  Transfers Overall transfer level: Needs assistance Equipment used: Rolling walker (2 wheeled) Transfers: Sit to/from Stand Sit to Stand: Supervision;Min guard         General transfer comment: 25% VC's on proper hand placement and safety with turns  Ambulation/Gait Ambulation/Gait assistance: Supervision;Min guard Gait Distance (Feet): 12 Feet Assistive device: Rolling walker (2 wheeled) Gait Pattern/deviations: Step-to pattern;Decreased step length - right;Decreased step length - left;Decreased weight shift to left;Antalgic Gait velocity: decr   General Gait Details: amb to and from stairs   Stairs Stairs: Yes Stairs assistance: Min assist Stair Management: No rails;Forwards;With walker Number of Stairs: 2 General stair comments: 50% VC's on proper walker placement up forward with walker.  Videoed on pt's personal cell for spouse to  see   Wheelchair Mobility    Modified Rankin (Stroke Patients Only)       Balance                                            Cognition Arousal/Alertness: Awake/alert Behavior During Therapy: WFL for tasks assessed/performed;Anxious Overall Cognitive Status: Within Functional Limits for tasks assessed                                        Exercises      General Comments        Pertinent Vitals/Pain Pain Assessment: 0-10 Pain Score: 5  Pain Location: L knee Pain Descriptors / Indicators: Sore;Tightness;Tender Pain Intervention(s): Monitored during session;Premedicated before session;Repositioned;Ice applied    Home Living                      Prior Function            PT Goals (current goals can now be found in the care plan section) Progress towards PT goals: Progressing toward goals    Frequency    7X/week      PT Plan      Co-evaluation              AM-PAC PT "6 Clicks" Mobility   Outcome Measure  Help needed turning from your back to your side while in a flat bed without using  bedrails?: A Little Help needed moving from lying on your back to sitting on the side of a flat bed without using bedrails?: A Little Help needed moving to and from a bed to a chair (including a wheelchair)?: A Little Help needed standing up from a chair using your arms (e.g., wheelchair or bedside chair)?: A Little Help needed to walk in hospital room?: A Little Help needed climbing 3-5 steps with a railing? : A Lot 6 Click Score: 17    End of Session Equipment Utilized During Treatment: Gait belt Activity Tolerance: Patient tolerated treatment well Patient left: in chair;with call bell/phone within reach Nurse Communication: Mobility status PT Visit Diagnosis: Other abnormalities of gait and mobility (R26.89);Difficulty in walking, not elsewhere classified (R26.2)     Time: 9311-2162 PT Time Calculation (min)  (ACUTE ONLY): 12 min  Charges:  $Gait Training: 8-22 mins                      Rica Koyanagi  PTA Acute  Rehabilitation Services Pager      [phone] Office      [phone]

## 2019-04-10 DIAGNOSIS — I10 Essential (primary) hypertension: Secondary | ICD-10-CM | POA: Diagnosis not present

## 2019-04-10 DIAGNOSIS — E1142 Type 2 diabetes mellitus with diabetic polyneuropathy: Secondary | ICD-10-CM | POA: Diagnosis not present

## 2019-04-10 DIAGNOSIS — M519 Unspecified thoracic, thoracolumbar and lumbosacral intervertebral disc disorder: Secondary | ICD-10-CM | POA: Diagnosis not present

## 2019-04-10 DIAGNOSIS — E785 Hyperlipidemia, unspecified: Secondary | ICD-10-CM | POA: Diagnosis not present

## 2019-04-10 DIAGNOSIS — K219 Gastro-esophageal reflux disease without esophagitis: Secondary | ICD-10-CM | POA: Diagnosis not present

## 2019-04-10 DIAGNOSIS — G4733 Obstructive sleep apnea (adult) (pediatric): Secondary | ICD-10-CM | POA: Diagnosis not present

## 2019-04-10 DIAGNOSIS — E669 Obesity, unspecified: Secondary | ICD-10-CM | POA: Diagnosis not present

## 2019-04-10 DIAGNOSIS — Z471 Aftercare following joint replacement surgery: Secondary | ICD-10-CM | POA: Diagnosis not present

## 2019-04-10 DIAGNOSIS — J45909 Unspecified asthma, uncomplicated: Secondary | ICD-10-CM | POA: Diagnosis not present

## 2019-04-11 NOTE — Discharge Summary (Signed)
Patient ID: Janice [name] MRN: [id number] DOB/AGE: 1951/05/12 68 y.o.  Admit date: 04/08/2019 Discharge date: 04/11/2019  Admission Diagnoses:  Principal Problem:   Primary localized osteoarthritis of left knee Active Problems:   HTN (hypertension)   Diabetes mellitus ([place])   Asthma with acute exacerbation   Obstructive sleep apnea treated with continuous positive airway pressure (CPAP)   DDD (degenerative disc disease), lumbar   Discharge Diagnoses:  Same  Past Medical History:  Diagnosis Date  . Allergy   . Asthma    seasonal  . Cellulitis 2013   Left toe  . Common migraine    History of  . Complication of anesthesia    Per pt/had breathing problems with "block" during rotator cuff surgery. 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  . DDD (degenerative disc disease)   . Depression    denies takes paxil for migraines  . Diabetes mellitus    type 2  . Diabetic peripheral neuropathy ([place])   . Diverticulitis   . Diverticulosis   . DJD (degenerative joint disease)   . GERD (gastroesophageal reflux disease)   . History of colon polyps    hyperplastic  . History of gastric polyp   . Hyperlipidemia   . Hypertension   . Iron deficiency anemia   . Obesity   . OSA on CPAP    cpap  . Pneumonia    april 2020  mild  . Primary localized osteoarthritis of left knee 03/27/2019  . Sensorineural hearing loss     Surgeries: Procedure(s): TOTAL KNEE ARTHROPLASTY on 04/08/2019   Consultants:   Discharged Condition: Improved  Hospital Course: [name] is an 68 y.o. female who was admitted 04/08/2019 for operative treatment ofPrimary localized osteoarthritis of left knee. Patient has severe unremitting pain that affects sleep, daily activities, and work/hobbies. After pre-op clearance the patient was taken to the operating room on 04/08/2019 and underwent  Procedure(s): TOTAL KNEE ARTHROPLASTY.    Patient was given perioperative antibiotics:  Anti-infectives (From  admission, onward)   Start     Dose/Rate Route Frequency Ordered Stop   04/08/19 2000  vancomycin (VANCOCIN) IVPB 1000 mg/200 mL premix     1,000 mg 200 mL/hr over 60 Minutes Intravenous Every 12 hours 04/08/19 1413 04/08/19 2029   04/08/19 0600  vancomycin (VANCOCIN) 1,500 mg in sodium chloride 0.9 % 500 mL IVPB     1,500 mg 250 mL/hr over 120 Minutes Intravenous On call to O.R. 04/07/19 0757 04/08/19 1007       Patient was given sequential compression devices, early ambulation, and chemoprophylaxis to prevent DVT.  Patient had significant difficulty with post operative nausea and vomiting as well as anxiety   Improved with phenergan  Patient benefited maximally from hospital stay and there were no complications.    Recent vital signs: No data found.   Recent laboratory studies:  Recent Labs    04/09/19 0303  WBC 16.6*  HGB 10.6*  HCT 35.3*  PLT 79*  NA 130*  K 5.7*  CL 99  CO2 23  BUN 15  CREATININE 0.90  GLUCOSE 320*  CALCIUM 8.5*     Discharge Medications:   Allergies as of 04/09/2019      Reactions   Breo Ellipta [fluticasone Furoate-vilanterol] Hives   Iodine Hives, Swelling, Other (See Comments)   And Betadine/caused hives and swelling   Latex Hives, Itching, Rash, Other (See Comments)   Burning   Tessalon Perles [benzonatate] Anaphylaxis   Zocor [simvastatin] Other (See Comments)  Caused neuropathy in arms   Phenergan [promethazine] Nausea Only   Wellbutrin [bupropion]    Negative thoughts.   Amoxicillin Hives   Blisters Did it involve swelling of the face/tongue/throat, SOB, or low BP? Yes Did it involve sudden or severe rash/hives, skin peeling, or any reaction on the inside of your mouth or nose? Yes Did you need to seek medical attention at a hospital or doctor's office?Unknown When did it last happen?2019 If all above answers are "NO", may proceed with cephalosporin use.   Diflucan [fluconazole] Hives, Other (See Comments)   blister    Doxycycline Itching   Levofloxacin Other (See Comments)   Joint aches//leg, shoulder pain      Medication List    STOP taking these medications   amLODipine 10 MG tablet Commonly known as: NORVASC   chlorthalidone 25 MG tablet Commonly known as: HYGROTON   irbesartan 300 MG tablet Commonly known as: AVAPRO     TAKE these medications   acetaminophen 500 MG tablet Commonly known as: TYLENOL Take 1,000 mg by mouth every 6 (six) hours as needed for moderate pain.   albuterol 108 (90 Base) MCG/ACT inhaler Commonly known as: VENTOLIN HFA Inhale 1-2 puffs into the lungs every 6 (six) hours as needed for wheezing or shortness of breath.   aspirin 325 MG EC tablet 1 tab a day for the next 30 days to prevent blood clots   CAL MAG ZINC +D3 PO Take 2 tablets by mouth daily.   cetirizine 10 MG tablet Commonly known as: ZYRTEC Take 10 mg by mouth daily.   desoximetasone 0.25 % cream Commonly known as: TOPICORT Apply 1 application topically 4 (four) times daily as needed (rash (applied to left ear daily)).   diclofenac sodium 1 % Gel Commonly known as: VOLTAREN Apply 2 g topically 2 (two) times daily as needed (knee pain).   docusate sodium 100 MG capsule Commonly known as: COLACE 1 tab 2 times a day while on narcotics.  STOOL SOFTENER   ferrous sulfate 325 (65 FE) MG tablet Take 325 mg by mouth at bedtime.   Flaxseed (Linseed) Oil Take 1,000 mg by mouth daily.   fluticasone 50 MCG/ACT nasal spray Commonly known as: FLONASE Place 2 sprays into both nostrils daily.   gabapentin 300 MG capsule Commonly known as: NEURONTIN Take 600 mg by mouth daily with supper.   Glucophage XR 500 MG 24 hr tablet Generic drug: metFORMIN Take 1,000 mg by mouth 2 (two) times daily.   insulin NPH Human 100 UNIT/ML injection Commonly known as: NOVOLIN N Inject 40-75 Units into the skin See admin instructions. Inject 75 units subcutaneously at breakfast, 40 units at lunch & 65 units at  supper   insulin regular 100 units/mL injection Commonly known as: NOVOLIN R Inject 30-45 Units into the skin See admin instructions. Inject 30 units subcutaneously in the morning, 30 units at lunch & 30 units at supper   omeprazole 40 MG capsule Commonly known as: PRILOSEC Take 40 mg by mouth at bedtime.   oxyCODONE 5 MG immediate release tablet Commonly known as: Oxy IR/ROXICODONE Take 1-2 tablets (5-10 mg total) by mouth every 4 (four) hours as needed for moderate pain (pain score 4-6).   PARoxetine 20 MG tablet Commonly known as: PAXIL Take 10 mg by mouth daily.   polyethylene glycol 17 g packet Commonly known as: MIRALAX / GLYCOLAX 17grams in 6 oz of something to drink twice a day until bowel movement.  LAXITIVE.  Restart if  two days since last bowel movement   REFRESH OPTIVE PF OP Apply 1 drop to eye 3 (three) times daily as needed (dry eyes).   rosuvastatin 5 MG tablet Commonly known as: CRESTOR Take 5 mg by mouth every evening.   terconazole 0.8 % vaginal cream Commonly known as: TERAZOL 3 Place 1 applicator vaginally as needed (yeast infections).   vitamin C 500 MG tablet Commonly known as: ASCORBIC ACID Take 500 mg by mouth 2 (two) times a day.            Discharge Care Instructions  (From admission, onward)         Start     Ordered   04/09/19 0000  Change dressing    Comments: DO NOT REMOVE BANDAGE OVER SURGICAL INCISION.  [place] WHOLE LEG INCLUDING OVER THE WATERPROOF BANDAGE WITH SOAP AND WATER EVERY DAY.   04/09/19 0924          Diagnostic Studies: No results found.  Disposition:   Discharge Instructions    CPM   Complete by: As directed    Continuous passive motion machine (CPM):      Use the CPM from 0 to 90 for 6 hours per day.       You may break it up into 2 or 3 sessions per day.      Use CPM for 2 weeks or until you are told to stop.   Call MD / Call 911   Complete by: As directed    If you experience chest pain or shortness of  breath, CALL 911 and be transported to the hospital emergency room.  If you develope a fever above 101 F, pus (white drainage) or increased drainage or redness at the wound, or calf pain, call your surgeon's office.   Change dressing   Complete by: As directed    DO NOT REMOVE BANDAGE OVER SURGICAL INCISION.  [place] WHOLE LEG INCLUDING OVER THE WATERPROOF BANDAGE WITH SOAP AND WATER EVERY DAY.   Constipation Prevention   Complete by: As directed    Drink plenty of fluids.  Prune juice may be helpful.  You may use a stool softener, such as Colace (over the counter) 100 mg twice a day.  Use MiraLax (over the counter) for constipation as needed.   Diet - low sodium heart healthy   Complete by: As directed    Discharge instructions   Complete by: As directed    INSTRUCTIONS AFTER JOINT REPLACEMENT   GET UP AND WALK EVERY HOUR SOMEWHERE.  DO NOT USE YOUR HANDS TO LIFT YOU LEG, USE YOUR LEG MUSCLES.    DO NOT TAKE YOUR BLOOD PRESSURE MEDICATION UNTIL YOU PRESSURE IS HIGHER THAN 140/90.  DRINK LOTS OF FLUIDS.  Remove items at home which could result in a fall. This includes throw rugs or furniture in walking pathways ICE to the affected joint every three hours while awake for 30 minutes at a time, for at least the first 3-5 days, and then as needed for pain and swelling.  Continue to use ice for pain and swelling. You may notice swelling that will progress down to the foot and ankle.  This is normal after surgery.  Elevate your leg when you are not up walking on it.   Continue to use the breathing machine you got in the hospital (incentive spirometer) which will help keep your temperature down.  It is common for your temperature to cycle up and down following surgery, especially at night when  you are not up moving around and exerting yourself.  The breathing machine keeps your lungs expanded and your temperature down.   DIET:  As you were doing prior to hospitalization, we recommend a well-balanced  diet.  DRESSING / WOUND CARE / SHOWERING  Keep the surgical dressing until follow up.  The dressing is water proof, so you can shower without any extra covering.  IF THE DRESSING FALLS OFF or the wound gets wet inside, change the dressing with sterile gauze.  Please use good hand washing techniques before changing the dressing.  Do not use any lotions or creams on the incision until instructed by your surgeon.    ACTIVITY  Increase activity slowly as tolerated, but follow the weight bearing instructions below.   No driving for 6 weeks or until further direction given by your physician.  You cannot drive while taking narcotics.  No lifting or carrying greater than 10 lbs. until further directed by your surgeon. Avoid periods of inactivity such as sitting longer than an hour when not asleep. This helps prevent blood clots.  You may return to work once you are authorized by your doctor.     WEIGHT BEARING   Weight bearing as tolerated with assist device (walker, cane, etc) as directed, use it as long as suggested by your surgeon or therapist, typically at least 2-3 weeks.   EXERCISES  Results after joint replacement surgery are often greatly improved when you follow the exercise, range of motion and muscle strengthening exercises prescribed by your doctor. Safety measures are also important to protect the joint from further injury. Any time any of these exercises cause you to have increased pain or swelling, decrease what you are doing until you are comfortable again and then slowly increase them. If you have problems or questions, call your caregiver or physical therapist for advice.   Rehabilitation is important following a joint replacement. After just a few days of immobilization, the muscles of the leg can become weakened and shrink (atrophy).  These exercises are designed to build up the tone and strength of the thigh and leg muscles and to improve motion. Often times heat used for twenty  to thirty minutes before working out will loosen up your tissues and help with improving the range of motion but do not use heat for the first two weeks following surgery (sometimes heat can increase post-operative swelling).   These exercises can be done on a training (exercise) mat, on the floor, on a table or on a bed. Use whatever works the best and is most comfortable for you.    Use music or television while you are exercising so that the exercises are a pleasant break in your day. This will make your life better with the exercises acting as a break in your routine that you can look forward to.   Perform all exercises about fifteen times, three times per day or as directed.  You should exercise both the operative leg and the other leg as well.   Exercises include:  Quad Sets - Tighten up the muscle on the front of the thigh (Quad) and hold for 5-10 seconds.   Straight Leg Raises - With your knee straight (if you were given a brace, keep it on), lift the leg to 60 degrees, hold for 3 seconds, and slowly lower the leg.  Perform this exercise against resistance later as your leg gets stronger.  Leg Slides: Lying on your back, slowly slide your foot toward your  buttocks, bending your knee up off the floor (only go as far as is comfortable). Then slowly slide your foot back down until your leg is flat on the floor again.  Angel Wings: Lying on your back spread your legs to the side as far apart as you can without causing discomfort.  Hamstring Strength:  Lying on your back, push your heel against the floor with your leg straight by tightening up the muscles of your buttocks.  Repeat, but this time bend your knee to a comfortable angle, and push your heel against the floor.  You may put a pillow under the heel to make it more comfortable if necessary.   A rehabilitation program following joint replacement surgery can speed recovery and prevent re-injury in the future due to weakened muscles. Contact your  doctor or a physical therapist for more information on knee rehabilitation.    CONSTIPATION  Constipation is defined medically as fewer than three stools per week and severe constipation as less than one stool per week.  Even if you have a regular bowel pattern at home, your normal regimen is likely to be disrupted due to multiple reasons following surgery.  Combination of anesthesia, postoperative narcotics, change in appetite and fluid intake all can affect your bowels.   YOU MUST use at least one of the following options; they are listed in order of increasing strength to get the job done.  They are all available over the counter, and you may need to use some, POSSIBLY even all of these options:    Drink plenty of fluids (prune juice may be helpful) and high fiber foods Colace 100 mg by mouth twice a day  Senokot for constipation as directed and as needed Dulcolax (bisacodyl), take with full glass of water  Miralax (polyethylene glycol) once or twice a day as needed.  If you have tried all these things and are unable to have a bowel movement in the first 3-4 days after surgery call either your surgeon or your primary doctor.    If you experience loose stools or diarrhea, hold the medications until you stool forms back up.  If your symptoms do not get better within 1 week or if they get worse, check with your doctor.  If you experience "the worst abdominal pain ever" or develop nausea or vomiting, please contact the office immediately for further recommendations for treatment.   ITCHING:  If you experience itching with your medications, try taking only a single pain pill, or even half a pain pill at a time.  You can also use Benadryl over the counter for itching or also to help with sleep.   TED HOSE STOCKINGS:  Use stockings on both legs until for at least 2 weeks or as directed by physician office. They may be removed at night for sleeping.  MEDICATIONS:  See your medication summary on the  "After Visit Summary" that nursing will review with you.  You may have some home medications which will be placed on hold until you complete the course of blood thinner medication.  It is important for you to complete the blood thinner medication as prescribed.  PRECAUTIONS:  If you experience chest pain or shortness of breath - call 911 immediately for transfer to the hospital emergency department.   If you develop a fever greater that 101 F, purulent drainage from wound, increased redness or drainage from wound, foul odor from the wound/dressing, or calf pain - CONTACT YOUR SURGEON.  FOLLOW-UP APPOINTMENTS:  If you do not already have a post-op appointment, please call the office for an appointment to be seen by your surgeon.  Guidelines for how soon to be seen are listed in your "After Visit Summary", but are typically between 1-4 weeks after surgery.  OTHER INSTRUCTIONS:   Knee Replacement:  Do not place pillow under knee, focus on keeping the knee straight while resting. CPM instructions: 0-90 degrees, 2 hours in the morning, 2 hours in the afternoon, and 2 hours in the evening. Place foam block, curve side up under heel at all times except when in CPM or when walking.  DO NOT modify, tear, cut, or change the foam block in any way.  MAKE SURE YOU:  Understand these instructions.  Get help right away if you are not doing well or get worse.    Thank you for letting us be a part of your medical care team.  It is a privilege we respect greatly.  We hope these instructions will help you stay on track for a fast and full recovery!   Do not put a pillow under the knee. Place it under the heel.   Complete by: As directed    Place gray foam block, curve side up under heel at all times except when in CPM or when walking.  DO NOT modify, tear, cut, or change in any way the gray foam block. IF UNABLE TO TOLERATE BLOCK INITIALLY THEN USE A BELT TO HOOK  AROUND BOTH FEET WHEN YOU ARE SITTING TO KEEP YOUR TOES POINTED TO THE CEILING   Increase activity slowly as tolerated   Complete by: As directed    Patient may shower   Complete by: As directed    Aquacel dressing is water proof    Wash over it and the whole leg with soap and water at the end of your shower   TED hose   Complete by: As directed    Use stockings (TED hose) for 2 weeks on both leg(s).  You may remove them at night for sleeping.      Follow-up Information    Elsie Saas, MD Follow up on 04/22/2019.   Specialty: Orthopedic Surgery Why: arrive at 1:30 for 2 pm physical therapy and will see Dr Noemi Chapel after physical therapy Contact information: [address] [address] [phone]        Home, [facility] Follow up.   Specialty: [facility] Contact information: [place] [address] [phone]            Signed: Linda Hedges 04/11/2019, 12:41 PM

## 2019-04-12 DIAGNOSIS — I10 Essential (primary) hypertension: Secondary | ICD-10-CM | POA: Diagnosis not present

## 2019-04-12 DIAGNOSIS — E669 Obesity, unspecified: Secondary | ICD-10-CM | POA: Diagnosis not present

## 2019-04-12 DIAGNOSIS — K219 Gastro-esophageal reflux disease without esophagitis: Secondary | ICD-10-CM | POA: Diagnosis not present

## 2019-04-12 DIAGNOSIS — E785 Hyperlipidemia, unspecified: Secondary | ICD-10-CM | POA: Diagnosis not present

## 2019-04-12 DIAGNOSIS — J45909 Unspecified asthma, uncomplicated: Secondary | ICD-10-CM | POA: Diagnosis not present

## 2019-04-12 DIAGNOSIS — M519 Unspecified thoracic, thoracolumbar and lumbosacral intervertebral disc disorder: Secondary | ICD-10-CM | POA: Diagnosis not present

## 2019-04-12 DIAGNOSIS — Z471 Aftercare following joint replacement surgery: Secondary | ICD-10-CM | POA: Diagnosis not present

## 2019-04-12 DIAGNOSIS — G4733 Obstructive sleep apnea (adult) (pediatric): Secondary | ICD-10-CM | POA: Diagnosis not present

## 2019-04-12 DIAGNOSIS — E1142 Type 2 diabetes mellitus with diabetic polyneuropathy: Secondary | ICD-10-CM | POA: Diagnosis not present

## 2019-04-13 DIAGNOSIS — M1712 Unilateral primary osteoarthritis, left knee: Secondary | ICD-10-CM | POA: Diagnosis not present

## 2019-04-15 DIAGNOSIS — E785 Hyperlipidemia, unspecified: Secondary | ICD-10-CM | POA: Diagnosis not present

## 2019-04-15 DIAGNOSIS — E669 Obesity, unspecified: Secondary | ICD-10-CM | POA: Diagnosis not present

## 2019-04-15 DIAGNOSIS — K219 Gastro-esophageal reflux disease without esophagitis: Secondary | ICD-10-CM | POA: Diagnosis not present

## 2019-04-15 DIAGNOSIS — M519 Unspecified thoracic, thoracolumbar and lumbosacral intervertebral disc disorder: Secondary | ICD-10-CM | POA: Diagnosis not present

## 2019-04-15 DIAGNOSIS — J45909 Unspecified asthma, uncomplicated: Secondary | ICD-10-CM | POA: Diagnosis not present

## 2019-04-15 DIAGNOSIS — Z471 Aftercare following joint replacement surgery: Secondary | ICD-10-CM | POA: Diagnosis not present

## 2019-04-15 DIAGNOSIS — I10 Essential (primary) hypertension: Secondary | ICD-10-CM | POA: Diagnosis not present

## 2019-04-15 DIAGNOSIS — G4733 Obstructive sleep apnea (adult) (pediatric): Secondary | ICD-10-CM | POA: Diagnosis not present

## 2019-04-15 DIAGNOSIS — E1142 Type 2 diabetes mellitus with diabetic polyneuropathy: Secondary | ICD-10-CM | POA: Diagnosis not present

## 2019-04-16 DIAGNOSIS — R0982 Postnasal drip: Secondary | ICD-10-CM | POA: Diagnosis not present

## 2019-04-16 DIAGNOSIS — J45909 Unspecified asthma, uncomplicated: Secondary | ICD-10-CM | POA: Diagnosis not present

## 2019-04-16 DIAGNOSIS — N182 Chronic kidney disease, stage 2 (mild): Secondary | ICD-10-CM | POA: Diagnosis not present

## 2019-04-16 DIAGNOSIS — M5136 Other intervertebral disc degeneration, lumbar region: Secondary | ICD-10-CM | POA: Diagnosis not present

## 2019-04-16 DIAGNOSIS — M179 Osteoarthritis of knee, unspecified: Secondary | ICD-10-CM | POA: Diagnosis not present

## 2019-04-16 DIAGNOSIS — G4733 Obstructive sleep apnea (adult) (pediatric): Secondary | ICD-10-CM | POA: Diagnosis not present

## 2019-04-16 DIAGNOSIS — J309 Allergic rhinitis, unspecified: Secondary | ICD-10-CM | POA: Diagnosis not present

## 2019-04-16 DIAGNOSIS — I129 Hypertensive chronic kidney disease with stage 1 through stage 4 chronic kidney disease, or unspecified chronic kidney disease: Secondary | ICD-10-CM | POA: Diagnosis not present

## 2019-04-16 DIAGNOSIS — Z96652 Presence of left artificial knee joint: Secondary | ICD-10-CM | POA: Diagnosis not present

## 2019-04-17 DIAGNOSIS — J45909 Unspecified asthma, uncomplicated: Secondary | ICD-10-CM | POA: Diagnosis not present

## 2019-04-17 DIAGNOSIS — E669 Obesity, unspecified: Secondary | ICD-10-CM | POA: Diagnosis not present

## 2019-04-17 DIAGNOSIS — E785 Hyperlipidemia, unspecified: Secondary | ICD-10-CM | POA: Diagnosis not present

## 2019-04-17 DIAGNOSIS — G4733 Obstructive sleep apnea (adult) (pediatric): Secondary | ICD-10-CM | POA: Diagnosis not present

## 2019-04-17 DIAGNOSIS — K219 Gastro-esophageal reflux disease without esophagitis: Secondary | ICD-10-CM | POA: Diagnosis not present

## 2019-04-17 DIAGNOSIS — I10 Essential (primary) hypertension: Secondary | ICD-10-CM | POA: Diagnosis not present

## 2019-04-17 DIAGNOSIS — Z471 Aftercare following joint replacement surgery: Secondary | ICD-10-CM | POA: Diagnosis not present

## 2019-04-17 DIAGNOSIS — M519 Unspecified thoracic, thoracolumbar and lumbosacral intervertebral disc disorder: Secondary | ICD-10-CM | POA: Diagnosis not present

## 2019-04-17 DIAGNOSIS — E1142 Type 2 diabetes mellitus with diabetic polyneuropathy: Secondary | ICD-10-CM | POA: Diagnosis not present

## 2019-04-19 DIAGNOSIS — J45909 Unspecified asthma, uncomplicated: Secondary | ICD-10-CM | POA: Diagnosis not present

## 2019-04-19 DIAGNOSIS — E669 Obesity, unspecified: Secondary | ICD-10-CM | POA: Diagnosis not present

## 2019-04-19 DIAGNOSIS — I10 Essential (primary) hypertension: Secondary | ICD-10-CM | POA: Diagnosis not present

## 2019-04-19 DIAGNOSIS — M519 Unspecified thoracic, thoracolumbar and lumbosacral intervertebral disc disorder: Secondary | ICD-10-CM | POA: Diagnosis not present

## 2019-04-19 DIAGNOSIS — E785 Hyperlipidemia, unspecified: Secondary | ICD-10-CM | POA: Diagnosis not present

## 2019-04-19 DIAGNOSIS — Z471 Aftercare following joint replacement surgery: Secondary | ICD-10-CM | POA: Diagnosis not present

## 2019-04-19 DIAGNOSIS — G4733 Obstructive sleep apnea (adult) (pediatric): Secondary | ICD-10-CM | POA: Diagnosis not present

## 2019-04-19 DIAGNOSIS — K219 Gastro-esophageal reflux disease without esophagitis: Secondary | ICD-10-CM | POA: Diagnosis not present

## 2019-04-19 DIAGNOSIS — E1142 Type 2 diabetes mellitus with diabetic polyneuropathy: Secondary | ICD-10-CM | POA: Diagnosis not present

## 2019-04-22 DIAGNOSIS — M25562 Pain in left knee: Secondary | ICD-10-CM | POA: Diagnosis not present

## 2019-04-22 DIAGNOSIS — M1712 Unilateral primary osteoarthritis, left knee: Secondary | ICD-10-CM | POA: Diagnosis not present

## 2019-04-22 DIAGNOSIS — M6281 Muscle weakness (generalized): Secondary | ICD-10-CM | POA: Diagnosis not present

## 2019-04-22 DIAGNOSIS — R262 Difficulty in walking, not elsewhere classified: Secondary | ICD-10-CM | POA: Diagnosis not present

## 2019-04-22 DIAGNOSIS — M25662 Stiffness of left knee, not elsewhere classified: Secondary | ICD-10-CM | POA: Diagnosis not present

## 2019-04-29 DIAGNOSIS — M6281 Muscle weakness (generalized): Secondary | ICD-10-CM | POA: Diagnosis not present

## 2019-04-29 DIAGNOSIS — R262 Difficulty in walking, not elsewhere classified: Secondary | ICD-10-CM | POA: Diagnosis not present

## 2019-04-29 DIAGNOSIS — M25662 Stiffness of left knee, not elsewhere classified: Secondary | ICD-10-CM | POA: Diagnosis not present

## 2019-04-29 DIAGNOSIS — M25562 Pain in left knee: Secondary | ICD-10-CM | POA: Diagnosis not present

## 2019-05-02 DIAGNOSIS — M6281 Muscle weakness (generalized): Secondary | ICD-10-CM | POA: Diagnosis not present

## 2019-05-02 DIAGNOSIS — M25562 Pain in left knee: Secondary | ICD-10-CM | POA: Diagnosis not present

## 2019-05-02 DIAGNOSIS — M25662 Stiffness of left knee, not elsewhere classified: Secondary | ICD-10-CM | POA: Diagnosis not present

## 2019-05-02 DIAGNOSIS — R262 Difficulty in walking, not elsewhere classified: Secondary | ICD-10-CM | POA: Diagnosis not present

## 2019-05-06 DIAGNOSIS — M25562 Pain in left knee: Secondary | ICD-10-CM | POA: Diagnosis not present

## 2019-05-06 DIAGNOSIS — M6281 Muscle weakness (generalized): Secondary | ICD-10-CM | POA: Diagnosis not present

## 2019-05-06 DIAGNOSIS — R262 Difficulty in walking, not elsewhere classified: Secondary | ICD-10-CM | POA: Diagnosis not present

## 2019-05-06 DIAGNOSIS — M25662 Stiffness of left knee, not elsewhere classified: Secondary | ICD-10-CM | POA: Diagnosis not present

## 2019-05-09 DIAGNOSIS — M25662 Stiffness of left knee, not elsewhere classified: Secondary | ICD-10-CM | POA: Diagnosis not present

## 2019-05-09 DIAGNOSIS — R262 Difficulty in walking, not elsewhere classified: Secondary | ICD-10-CM | POA: Diagnosis not present

## 2019-05-09 DIAGNOSIS — M25562 Pain in left knee: Secondary | ICD-10-CM | POA: Diagnosis not present

## 2019-05-09 DIAGNOSIS — M6281 Muscle weakness (generalized): Secondary | ICD-10-CM | POA: Diagnosis not present

## 2019-05-13 DIAGNOSIS — M25562 Pain in left knee: Secondary | ICD-10-CM | POA: Diagnosis not present

## 2019-05-13 DIAGNOSIS — M6281 Muscle weakness (generalized): Secondary | ICD-10-CM | POA: Diagnosis not present

## 2019-05-13 DIAGNOSIS — R262 Difficulty in walking, not elsewhere classified: Secondary | ICD-10-CM | POA: Diagnosis not present

## 2019-05-13 DIAGNOSIS — M25662 Stiffness of left knee, not elsewhere classified: Secondary | ICD-10-CM | POA: Diagnosis not present

## 2019-05-16 DIAGNOSIS — M6281 Muscle weakness (generalized): Secondary | ICD-10-CM | POA: Diagnosis not present

## 2019-05-16 DIAGNOSIS — M25562 Pain in left knee: Secondary | ICD-10-CM | POA: Diagnosis not present

## 2019-05-16 DIAGNOSIS — M25662 Stiffness of left knee, not elsewhere classified: Secondary | ICD-10-CM | POA: Diagnosis not present

## 2019-05-16 DIAGNOSIS — R262 Difficulty in walking, not elsewhere classified: Secondary | ICD-10-CM | POA: Diagnosis not present

## 2019-05-21 DIAGNOSIS — Z96652 Presence of left artificial knee joint: Secondary | ICD-10-CM | POA: Diagnosis not present

## 2019-05-21 DIAGNOSIS — R1032 Left lower quadrant pain: Secondary | ICD-10-CM | POA: Diagnosis not present

## 2019-05-22 ENCOUNTER — Other Ambulatory Visit: Payer: Self-pay | Admitting: Internal Medicine

## 2019-05-22 ENCOUNTER — Inpatient Hospital Stay: Admission: RE | Admit: 2019-05-22 | Payer: Medicare HMO | Source: Ambulatory Visit

## 2019-05-22 DIAGNOSIS — R1032 Left lower quadrant pain: Secondary | ICD-10-CM

## 2019-05-23 DIAGNOSIS — M25662 Stiffness of left knee, not elsewhere classified: Secondary | ICD-10-CM | POA: Diagnosis not present

## 2019-05-23 DIAGNOSIS — M6281 Muscle weakness (generalized): Secondary | ICD-10-CM | POA: Diagnosis not present

## 2019-05-23 DIAGNOSIS — R262 Difficulty in walking, not elsewhere classified: Secondary | ICD-10-CM | POA: Diagnosis not present

## 2019-05-23 DIAGNOSIS — M25562 Pain in left knee: Secondary | ICD-10-CM | POA: Diagnosis not present

## 2019-05-27 ENCOUNTER — Other Ambulatory Visit: Payer: Medicare HMO

## 2019-05-27 DIAGNOSIS — M25562 Pain in left knee: Secondary | ICD-10-CM | POA: Diagnosis not present

## 2019-05-27 DIAGNOSIS — M25662 Stiffness of left knee, not elsewhere classified: Secondary | ICD-10-CM | POA: Diagnosis not present

## 2019-05-27 DIAGNOSIS — R262 Difficulty in walking, not elsewhere classified: Secondary | ICD-10-CM | POA: Diagnosis not present

## 2019-05-27 DIAGNOSIS — M6281 Muscle weakness (generalized): Secondary | ICD-10-CM | POA: Diagnosis not present

## 2019-05-30 DIAGNOSIS — R262 Difficulty in walking, not elsewhere classified: Secondary | ICD-10-CM | POA: Diagnosis not present

## 2019-05-30 DIAGNOSIS — M25562 Pain in left knee: Secondary | ICD-10-CM | POA: Diagnosis not present

## 2019-05-30 DIAGNOSIS — M6281 Muscle weakness (generalized): Secondary | ICD-10-CM | POA: Diagnosis not present

## 2019-05-30 DIAGNOSIS — M25662 Stiffness of left knee, not elsewhere classified: Secondary | ICD-10-CM | POA: Diagnosis not present

## 2019-06-03 DIAGNOSIS — M6281 Muscle weakness (generalized): Secondary | ICD-10-CM | POA: Diagnosis not present

## 2019-06-03 DIAGNOSIS — M25662 Stiffness of left knee, not elsewhere classified: Secondary | ICD-10-CM | POA: Diagnosis not present

## 2019-06-03 DIAGNOSIS — R262 Difficulty in walking, not elsewhere classified: Secondary | ICD-10-CM | POA: Diagnosis not present

## 2019-06-03 DIAGNOSIS — M25562 Pain in left knee: Secondary | ICD-10-CM | POA: Diagnosis not present

## 2019-06-05 DIAGNOSIS — R262 Difficulty in walking, not elsewhere classified: Secondary | ICD-10-CM | POA: Diagnosis not present

## 2019-06-05 DIAGNOSIS — M6281 Muscle weakness (generalized): Secondary | ICD-10-CM | POA: Diagnosis not present

## 2019-06-05 DIAGNOSIS — M25562 Pain in left knee: Secondary | ICD-10-CM | POA: Diagnosis not present

## 2019-06-05 DIAGNOSIS — M25662 Stiffness of left knee, not elsewhere classified: Secondary | ICD-10-CM | POA: Diagnosis not present

## 2019-06-10 DIAGNOSIS — M6281 Muscle weakness (generalized): Secondary | ICD-10-CM | POA: Diagnosis not present

## 2019-06-10 DIAGNOSIS — M25562 Pain in left knee: Secondary | ICD-10-CM | POA: Diagnosis not present

## 2019-06-10 DIAGNOSIS — M25662 Stiffness of left knee, not elsewhere classified: Secondary | ICD-10-CM | POA: Diagnosis not present

## 2019-06-10 DIAGNOSIS — R262 Difficulty in walking, not elsewhere classified: Secondary | ICD-10-CM | POA: Diagnosis not present

## 2019-06-20 DIAGNOSIS — F329 Major depressive disorder, single episode, unspecified: Secondary | ICD-10-CM | POA: Diagnosis not present

## 2019-06-20 DIAGNOSIS — G4733 Obstructive sleep apnea (adult) (pediatric): Secondary | ICD-10-CM | POA: Diagnosis not present

## 2019-06-20 DIAGNOSIS — E782 Mixed hyperlipidemia: Secondary | ICD-10-CM | POA: Diagnosis not present

## 2019-06-20 DIAGNOSIS — E119 Type 2 diabetes mellitus without complications: Secondary | ICD-10-CM | POA: Diagnosis not present

## 2019-06-20 DIAGNOSIS — I129 Hypertensive chronic kidney disease with stage 1 through stage 4 chronic kidney disease, or unspecified chronic kidney disease: Secondary | ICD-10-CM | POA: Diagnosis not present

## 2019-06-20 DIAGNOSIS — Z96652 Presence of left artificial knee joint: Secondary | ICD-10-CM | POA: Diagnosis not present

## 2019-06-20 DIAGNOSIS — N182 Chronic kidney disease, stage 2 (mild): Secondary | ICD-10-CM | POA: Diagnosis not present

## 2019-06-20 DIAGNOSIS — D509 Iron deficiency anemia, unspecified: Secondary | ICD-10-CM | POA: Diagnosis not present

## 2019-06-21 DIAGNOSIS — R7989 Other specified abnormal findings of blood chemistry: Secondary | ICD-10-CM | POA: Diagnosis not present

## 2019-06-21 DIAGNOSIS — I129 Hypertensive chronic kidney disease with stage 1 through stage 4 chronic kidney disease, or unspecified chronic kidney disease: Secondary | ICD-10-CM | POA: Diagnosis not present

## 2019-07-15 ENCOUNTER — Other Ambulatory Visit: Payer: Self-pay | Admitting: Neurology

## 2019-07-15 ENCOUNTER — Telehealth: Payer: Self-pay | Admitting: *Deleted

## 2019-07-15 DIAGNOSIS — G4733 Obstructive sleep apnea (adult) (pediatric): Secondary | ICD-10-CM

## 2019-07-15 DIAGNOSIS — Z9989 Dependence on other enabling machines and devices: Secondary | ICD-10-CM

## 2019-07-15 NOTE — Telephone Encounter (Signed)
Pt called has question about faxing an order over to [facility]. Please call [phone]

## 2019-07-15 NOTE — Telephone Encounter (Signed)
Called the patient and advised her that she is due for yearly CPAP follow up visit. I have scheduled her in [place] with Amy for a mychart VV due to the patient hesitant to come in the office. I have sent a mychart sign up for the the patient through her email which was confirmed. I have also scheduled the video visit apt. Advised I will send order for supplies to hold her over until her jan apt. Pt verbalized understanding.

## 2019-07-19 DIAGNOSIS — G4733 Obstructive sleep apnea (adult) (pediatric): Secondary | ICD-10-CM | POA: Diagnosis not present

## 2019-08-12 DIAGNOSIS — M7582 Other shoulder lesions, left shoulder: Secondary | ICD-10-CM | POA: Diagnosis not present

## 2019-08-12 DIAGNOSIS — M19012 Primary osteoarthritis, left shoulder: Secondary | ICD-10-CM | POA: Diagnosis not present

## 2019-08-12 DIAGNOSIS — Z96652 Presence of left artificial knee joint: Secondary | ICD-10-CM | POA: Diagnosis not present

## 2019-08-12 DIAGNOSIS — M25512 Pain in left shoulder: Secondary | ICD-10-CM | POA: Diagnosis not present

## 2019-09-03 DIAGNOSIS — E7800 Pure hypercholesterolemia, unspecified: Secondary | ICD-10-CM | POA: Diagnosis not present

## 2019-09-03 DIAGNOSIS — H259 Unspecified age-related cataract: Secondary | ICD-10-CM | POA: Diagnosis not present

## 2019-09-03 DIAGNOSIS — E109 Type 1 diabetes mellitus without complications: Secondary | ICD-10-CM | POA: Diagnosis not present

## 2019-09-03 DIAGNOSIS — I10 Essential (primary) hypertension: Secondary | ICD-10-CM | POA: Diagnosis not present

## 2019-09-03 DIAGNOSIS — Z0100 Encounter for examination of eyes and vision without abnormal findings: Secondary | ICD-10-CM | POA: Diagnosis not present

## 2019-09-03 DIAGNOSIS — H04123 Dry eye syndrome of bilateral lacrimal glands: Secondary | ICD-10-CM | POA: Diagnosis not present

## 2019-09-09 ENCOUNTER — Ambulatory Visit: Payer: Medicare HMO | Attending: Internal Medicine

## 2019-09-09 DIAGNOSIS — Z20822 Contact with and (suspected) exposure to covid-19: Secondary | ICD-10-CM

## 2019-09-09 DIAGNOSIS — Z20828 Contact with and (suspected) exposure to other viral communicable diseases: Secondary | ICD-10-CM | POA: Diagnosis not present

## 2019-09-11 LAB — NOVEL CORONAVIRUS, NAA: SARS-CoV-2, NAA: NOT DETECTED

## 2019-09-12 DIAGNOSIS — R0602 Shortness of breath: Secondary | ICD-10-CM | POA: Diagnosis not present

## 2019-09-12 DIAGNOSIS — N182 Chronic kidney disease, stage 2 (mild): Secondary | ICD-10-CM | POA: Diagnosis not present

## 2019-09-12 DIAGNOSIS — E119 Type 2 diabetes mellitus without complications: Secondary | ICD-10-CM | POA: Diagnosis not present

## 2019-09-12 DIAGNOSIS — Z20828 Contact with and (suspected) exposure to other viral communicable diseases: Secondary | ICD-10-CM | POA: Diagnosis not present

## 2019-09-12 DIAGNOSIS — J45909 Unspecified asthma, uncomplicated: Secondary | ICD-10-CM | POA: Diagnosis not present

## 2019-09-12 DIAGNOSIS — I129 Hypertensive chronic kidney disease with stage 1 through stage 4 chronic kidney disease, or unspecified chronic kidney disease: Secondary | ICD-10-CM | POA: Diagnosis not present

## 2019-09-12 DIAGNOSIS — U071 COVID-19: Secondary | ICD-10-CM | POA: Diagnosis not present

## 2019-09-13 ENCOUNTER — Telehealth: Payer: Self-pay | Admitting: Unknown Physician Specialty

## 2019-09-13 ENCOUNTER — Other Ambulatory Visit: Payer: Self-pay | Admitting: Unknown Physician Specialty

## 2019-09-13 DIAGNOSIS — U071 COVID-19: Secondary | ICD-10-CM

## 2019-09-13 HISTORY — PX: BREAST BIOPSY: SHX20

## 2019-09-13 NOTE — Telephone Encounter (Signed)
I connected by phone with Janice [name] on 09/13/2019 at 12:21 PM to discuss the potential use of an new treatment for mild to moderate COVID-19 viral infection in non-hospitalized patients.  This patient is a 69 y.o. female that meets the FDA criteria for Emergency Use Authorization of bamlanivimab or casirivimab\imdevimab.  Has a (+) direct SARS-CoV-2 viral test result  Has mild or moderate COVID-19   Is ? 69 years of age and weighs ? 40 kg  Is NOT hospitalized due to COVID-19  Is NOT requiring oxygen therapy or requiring an increase in baseline oxygen flow rate due to COVID-19  Is within 10 days of symptom onset  Has at least one of the high risk factor(s) for progression to severe COVID-19 and/or hospitalization as defined in EUA.  Specific high risk criteria : >/= 69 yo and comorbid conditions   I have spoken and communicated the following to the patient or parent/caregiver:  1. FDA has authorized the emergency use of bamlanivimab and casirivimab\imdevimab for the treatment of mild to moderate COVID-19 in adults and pediatric patients with positive results of direct SARS-CoV-2 viral testing who are 70 years of age and older weighing at least 40 kg, and who are at high risk for progressing to severe COVID-19 and/or hospitalization.  2. The significant known and potential risks and benefits of bamlanivimab and casirivimab\imdevimab, and the extent to which such potential risks and benefits are unknown.  3. Information on available alternative treatments and the risks and benefits of those alternatives, including clinical trials.  4. Patients treated with bamlanivimab and casirivimab\imdevimab should continue to self-isolate and use infection control measures (e.g., wear mask, isolate, social distance, avoid sharing personal items, clean and disinfect "high touch" surfaces, and frequent handwashing) according to CDC guidelines.   5. The patient or parent/caregiver has the option  to accept or refuse bamlanivimab or casirivimab\imdevimab .  After reviewing this information with the patient, The patient agreed to proceed with receiving the bamlanimivab infusion and will be provided a copy of the Fact sheet prior to receiving the infusion.Janice [name] 09/13/2019 12:21 PM  Symptom onset 12/26- will need a cancellation for Monday or will be out of the 10 day period of infusion benefit

## 2019-09-13 NOTE — Telephone Encounter (Signed)
Called to discuss with patient about Covid symptoms and the use of bamlanivimab, a monoclonal antibody infusion for those with mild to moderate Covid symptoms and at a high risk of hospitalization.  Pt is qualified for this infusion at the [facility] infusion center due to Age > 80 and co-morbid conditions   Message left to call back

## 2019-09-17 ENCOUNTER — Ambulatory Visit (HOSPITAL_COMMUNITY)
Admission: RE | Admit: 2019-09-17 | Discharge: 2019-09-17 | Disposition: A | Discharge status: Home / Self Care | Payer: Medicare Other | Source: Ambulatory Visit | Attending: Pulmonary Disease | Admitting: Pulmonary Disease

## 2019-09-17 DIAGNOSIS — U071 COVID-19: Secondary | ICD-10-CM | POA: Diagnosis present

## 2019-09-17 DIAGNOSIS — Z23 Encounter for immunization: Secondary | ICD-10-CM | POA: Insufficient documentation

## 2019-09-17 MED ORDER — SODIUM CHLORIDE 0.9 % IV SOLN
700.0000 mg | Freq: Once | INTRAVENOUS | Status: AC
  Administered 2019-09-17: 700 mg via INTRAVENOUS
  Filled 2019-09-17: qty 20

## 2019-09-17 MED ORDER — ALBUTEROL SULFATE HFA 108 (90 BASE) MCG/ACT IN AERS: 2.0000 | INHALATION_SPRAY | Freq: Once | RESPIRATORY_TRACT | Status: DC | PRN

## 2019-09-17 MED ORDER — METHYLPREDNISOLONE SODIUM SUCC 125 MG IJ SOLR: 125.0000 mg | Freq: Once | INTRAMUSCULAR | Status: DC | PRN

## 2019-09-17 MED ORDER — FAMOTIDINE IN NACL 20-0.9 MG/50ML-% IV SOLN: 20.0000 mg | Freq: Once | INTRAVENOUS | Status: DC | PRN

## 2019-09-17 MED ORDER — EPINEPHRINE 0.3 MG/0.3ML IJ SOAJ: 0.3000 mg | Freq: Once | INTRAMUSCULAR | Status: DC | PRN

## 2019-09-17 MED ORDER — DIPHENHYDRAMINE HCL 50 MG/ML IJ SOLN: 50.0000 mg | Freq: Once | INTRAMUSCULAR | Status: DC | PRN

## 2019-09-17 MED ORDER — SODIUM CHLORIDE 0.9 % IV SOLN
INTRAVENOUS | Status: DC | PRN
  Administered 2019-09-17: 17:00:00 250 mL via INTRAVENOUS

## 2019-09-17 NOTE — Progress Notes (Signed)
  Diagnosis: COVID-19  Physician: Dr. Joya Gaskins  Procedure: Covid Infusion Clinic Med: bamlanivimab infusion - Provided patient with bamlanimivab fact sheet for patients, parents and caregivers prior to infusion.  Complications: No immediate complications noted.  Discharge: Discharged home   Lise Pincus, Cleaster Corin 09/17/2019

## 2019-09-17 NOTE — Discharge Instructions (Signed)

## 2019-09-23 DIAGNOSIS — R221 Localized swelling, mass and lump, neck: Secondary | ICD-10-CM | POA: Diagnosis not present

## 2019-09-23 DIAGNOSIS — U071 COVID-19: Secondary | ICD-10-CM | POA: Diagnosis not present

## 2019-10-01 ENCOUNTER — Telehealth: Payer: Self-pay | Admitting: Family Medicine

## 2019-11-09 DIAGNOSIS — Z0100 Encounter for examination of eyes and vision without abnormal findings: Secondary | ICD-10-CM | POA: Diagnosis not present

## 2019-12-19 ENCOUNTER — Ambulatory Visit: Payer: Medicare HMO | Attending: Internal Medicine

## 2019-12-19 DIAGNOSIS — Z23 Encounter for immunization: Secondary | ICD-10-CM

## 2019-12-19 NOTE — Progress Notes (Signed)
   Covid-19 Vaccination Clinic  Name:  Erine Prayer    MRN: [id number] DOB: 12/21/1950  12/19/2019  Ms. Arteaga was observed post Covid-19 immunization for 15 minutes without incident. She was provided with Vaccine Information Sheet and instruction to access the V-Safe system.   Ms. Trahan was instructed to call 911 with any severe reactions post vaccine: Marland Kitchen Difficulty breathing  . Swelling of face and throat  . A fast heartbeat  . A bad rash all over body  . Dizziness and weakness   Immunizations Administered    Name Date Dose VIS Date Route   Pfizer COVID-19 Vaccine 12/19/2019  4:19 PM 0.3 mL 08/23/2019 Intramuscular   Manufacturer: [employer]   Lot: [id number]   [place]: [id number]

## 2019-12-31 DIAGNOSIS — G4733 Obstructive sleep apnea (adult) (pediatric): Secondary | ICD-10-CM | POA: Diagnosis not present

## 2019-12-31 DIAGNOSIS — F329 Major depressive disorder, single episode, unspecified: Secondary | ICD-10-CM | POA: Diagnosis not present

## 2019-12-31 DIAGNOSIS — G629 Polyneuropathy, unspecified: Secondary | ICD-10-CM | POA: Diagnosis not present

## 2019-12-31 DIAGNOSIS — I129 Hypertensive chronic kidney disease with stage 1 through stage 4 chronic kidney disease, or unspecified chronic kidney disease: Secondary | ICD-10-CM | POA: Diagnosis not present

## 2019-12-31 DIAGNOSIS — D509 Iron deficiency anemia, unspecified: Secondary | ICD-10-CM | POA: Diagnosis not present

## 2019-12-31 DIAGNOSIS — E119 Type 2 diabetes mellitus without complications: Secondary | ICD-10-CM | POA: Diagnosis not present

## 2019-12-31 DIAGNOSIS — E782 Mixed hyperlipidemia: Secondary | ICD-10-CM | POA: Diagnosis not present

## 2019-12-31 DIAGNOSIS — J45909 Unspecified asthma, uncomplicated: Secondary | ICD-10-CM | POA: Diagnosis not present

## 2020-01-06 NOTE — Progress Notes (Signed)
PATIENT: Janice [name] DOB: [birth year]  REASON FOR VISIT: follow up HISTORY FROM: patient  Virtual Visit via Telephone Note  I connected with Janice [name] on 01/07/20 at  7:45 AM EDT by telephone and verified that I am speaking with the correct person using two identifiers.   I discussed the limitations, risks, security and privacy concerns of performing an evaluation and management service by telephone and the availability of in person appointments. I also discussed with the patient that there may be a patient responsible charge related to this service. The patient expressed understanding and agreed to proceed.   History of Present Illness:  01/07/20 Janice [name] is a 69 y.o. female here today for follow up for OSA on CPAP. She is doing well. She is complaint with daily usage.  Compliance report dated 12/07/2019 through 01/05/2020 reveals that she is used CPAP every day for compliance of 100%.  She used CPAP greater than 4 hours every day for compliance of 100%.  Average usage was 7 hours and 28 minutes.  Residual AHI was 0.4 on 5 to 20 cm of water and an EPR of 3.  There was no significant leak noted.   History (copied from [employer] note on 08/27/2018)   UPDATE 12/16/2019CM Janice [name], 69 year old female returns for follow-up with a history of obstructive sleep apnea here for CPAP compliance.  She has had no problems with her machine. Compliance data dated 07/28/2018-08/26/2018 shows compliance greater than 4 hours at 100%.  Average usage 7 hours 34 minutes.  Set pressure 5 to 20 cm.  EPR level 3 leak 95th percentile 0.3.  AHI 0.6 ESS 0 she returns for reevaluation  CDSara Ople [name] a 69 y.o.female, seen here in a RV: 05-25-2017, the patient was issued a new CPAP after a HST confirmed the presence of OSA: She is a compliant user now, but she experienced bronchitis, Pneumonia and constant non- productive cough at night. Spasms of coughing, after 2 antibiotic  rounds.  She has been prescribed a narcotic cough syrup with codeine and tried to discontinue this medication but couldn't. Just coughing too much still at night to get restful sleep, just last night took some cough syrup to allow her to sleep. She reports that the CPAP is not pleasant and comfortable to wear while she has sees respiratory tract infections which may partially be viral and partially be allergic in origin. I would like for her to start to take a daily Claritin or Allegra, nondrowsy making to at least eliminate the possibility of allergies playing a role.She gets choked during the day, too. No sleep related, not even food intake related. Epiglottis spasm? I will give her the medical leave from CPAP use for the next7days until she has completely recovered. She should then start CPAP again.   Observations/Objective:  Generalized: Well developed, in no acute distress  Mentation: Alert oriented to time, place, history taking. Follows all commands speech and language fluent   Assessment and Plan:  69 y.o. year old female  has a past medical history of Allergy, Asthma, Cellulitis (2013), Common migraine, Complication of anesthesia, DDD (degenerative disc disease), Depression, Diabetes mellitus, Diabetic peripheral neuropathy ([place]), Diverticulitis, Diverticulosis, DJD (degenerative joint disease), GERD (gastroesophageal reflux disease), History of colon polyps, History of gastric polyp, Hyperlipidemia, Hypertension, Iron deficiency anemia, Obesity, OSA on CPAP, Pneumonia, Primary localized osteoarthritis of left knee (03/27/2019), and Sensorineural hearing loss. here with    ICD-10-CM   1. Obstructive sleep apnea treated with  continuous positive airway pressure (CPAP)  G47.33 For home use only DME continuous positive airway pressure (CPAP)   Z99.89    She is doing very well with CPAP therapy.  Compliance report reveals excellent compliance.  I have encouraged her to continue using CPAP  nightly and for greater than 4 hours each night.  We will send orders to [facility] for supplies for the next year.  She was encouraged to work on healthy lifestyle habits with well-balanced diet and regular exercise.  She will follow-up with [place] in 1 year, sooner if needed.  She verbalizes understanding and agreement with this plan.   Orders Placed This Encounter  Procedures  . For home use only DME continuous positive airway pressure (CPAP)    Supplies please    Order Specific Question:   Length of Need    Answer:   Lifetime    Order Specific Question:   Patient has OSA or probable OSA    Answer:   Yes    Order Specific Question:   Is the patient currently using CPAP in the home    Answer:   Yes    Order Specific Question:   Settings    Answer:   Other see comments    Order Specific Question:   CPAP supplies needed    Answer:   Mask, headgear, cushions, filters, heated tubing and water chamber    No orders of the defined types were placed in this encounter.    Follow Up Instructions:  I discussed the assessment and treatment plan with the patient. The patient was provided an opportunity to ask questions and all were answered. The patient agreed with the plan and demonstrated an understanding of the instructions.   The patient was advised to call back or seek an in-person evaluation if the symptoms worsen or if the condition fails to improve as anticipated.  I provided 15 minutes of non-face-to-face time during this encounter.  Patient is located at her place of residence during my chart visit.  Provider is in the office.   Debbora Presto, NP

## 2020-01-07 ENCOUNTER — Telehealth (INDEPENDENT_AMBULATORY_CARE_PROVIDER_SITE_OTHER): Payer: Medicare HMO | Admitting: Family Medicine

## 2020-01-07 ENCOUNTER — Encounter: Payer: Self-pay | Admitting: Family Medicine

## 2020-01-07 DIAGNOSIS — G4733 Obstructive sleep apnea (adult) (pediatric): Secondary | ICD-10-CM

## 2020-01-07 DIAGNOSIS — Z9989 Dependence on other enabling machines and devices: Secondary | ICD-10-CM | POA: Diagnosis not present

## 2020-01-07 NOTE — Progress Notes (Signed)
Message sent to [facility] for new cpap orders. Received.

## 2020-01-08 ENCOUNTER — Ambulatory Visit: Payer: Self-pay | Admitting: Family Medicine

## 2020-01-14 DIAGNOSIS — Z6839 Body mass index (BMI) 39.0-39.9, adult: Secondary | ICD-10-CM | POA: Diagnosis not present

## 2020-01-14 DIAGNOSIS — Z124 Encounter for screening for malignant neoplasm of cervix: Secondary | ICD-10-CM | POA: Diagnosis not present

## 2020-01-14 DIAGNOSIS — Z779 Other contact with and (suspected) exposures hazardous to health: Secondary | ICD-10-CM | POA: Diagnosis not present

## 2020-01-14 DIAGNOSIS — Z1231 Encounter for screening mammogram for malignant neoplasm of breast: Secondary | ICD-10-CM | POA: Diagnosis not present

## 2020-01-15 ENCOUNTER — Ambulatory Visit: Payer: Medicare HMO | Attending: Internal Medicine

## 2020-01-15 ENCOUNTER — Telehealth: Payer: Self-pay

## 2020-01-15 DIAGNOSIS — Z23 Encounter for immunization: Secondary | ICD-10-CM

## 2020-01-15 DIAGNOSIS — R0989 Other specified symptoms and signs involving the circulatory and respiratory systems: Secondary | ICD-10-CM

## 2020-01-15 NOTE — Progress Notes (Signed)
   Covid-19 Vaccination Clinic  Name:  Janice [name]    MRN: [id number] DOB: Apr 25, 1951  01/15/2020  [name] was observed post Covid-19 immunization for 15 minutes without incident. She was provided with Vaccine Information Sheet and instruction to access the V-Safe system.   [name] was instructed to call 911 with any severe reactions post vaccine: Marland Kitchen Difficulty breathing  . Swelling of face and throat  . A fast heartbeat  . A bad rash all over body  . Dizziness and weakness   Immunizations Administered    Name Date Dose VIS Date Route   Pfizer COVID-19 Vaccine 01/15/2020  2:55 PM 0.3 mL 11/06/2018 Intramuscular   Manufacturer: [place]   Lot: [id number]   [place]: [id number]

## 2020-01-16 NOTE — Telephone Encounter (Signed)
I placed the orders.    ICD-10-CM   1. Bilateral carotid bruits  R09.89 PCV CAROTID DUPLEX (BILATERAL)

## 2020-01-16 NOTE — Telephone Encounter (Signed)
Pt called office due to a missed call. She is aware of the needed appt and will call back once she checks her work schedule.

## 2020-01-22 ENCOUNTER — Other Ambulatory Visit: Payer: Self-pay | Admitting: Obstetrics and Gynecology

## 2020-01-22 DIAGNOSIS — Z1231 Encounter for screening mammogram for malignant neoplasm of breast: Secondary | ICD-10-CM

## 2020-01-29 ENCOUNTER — Other Ambulatory Visit: Payer: Self-pay

## 2020-01-29 ENCOUNTER — Ambulatory Visit: Payer: Medicare HMO

## 2020-01-29 ENCOUNTER — Other Ambulatory Visit: Payer: Self-pay | Admitting: Cardiology

## 2020-01-29 DIAGNOSIS — I6522 Occlusion and stenosis of left carotid artery: Secondary | ICD-10-CM

## 2020-01-29 DIAGNOSIS — R0989 Other specified symptoms and signs involving the circulatory and respiratory systems: Secondary | ICD-10-CM

## 2020-01-30 NOTE — Progress Notes (Signed)
Left carotid moderate stenosis will continue to f/u by serial ultrasound.

## 2020-02-12 ENCOUNTER — Ambulatory Visit: Payer: Medicare HMO

## 2020-02-28 ENCOUNTER — Other Ambulatory Visit: Payer: Self-pay

## 2020-02-28 ENCOUNTER — Ambulatory Visit
Admission: RE | Admit: 2020-02-28 | Discharge: 2020-02-28 | Disposition: A | Discharge status: Home / Self Care | Payer: Medicare HMO | Source: Ambulatory Visit | Attending: Obstetrics and Gynecology | Admitting: Obstetrics and Gynecology

## 2020-02-28 DIAGNOSIS — Z1231 Encounter for screening mammogram for malignant neoplasm of breast: Secondary | ICD-10-CM

## 2020-03-04 ENCOUNTER — Other Ambulatory Visit: Payer: Self-pay | Admitting: Obstetrics and Gynecology

## 2020-03-04 DIAGNOSIS — R928 Other abnormal and inconclusive findings on diagnostic imaging of breast: Secondary | ICD-10-CM

## 2020-03-09 ENCOUNTER — Other Ambulatory Visit: Payer: Self-pay

## 2020-03-09 ENCOUNTER — Encounter: Payer: Self-pay | Admitting: Cardiology

## 2020-03-09 ENCOUNTER — Ambulatory Visit: Payer: Medicare HMO | Admitting: Cardiology

## 2020-03-09 VITALS — BP 138/51 | HR 74 | Resp 17 | Ht 64.0 in | Wt 233.0 lb

## 2020-03-09 DIAGNOSIS — I6522 Occlusion and stenosis of left carotid artery: Secondary | ICD-10-CM

## 2020-03-09 DIAGNOSIS — Z794 Long term (current) use of insulin: Secondary | ICD-10-CM | POA: Diagnosis not present

## 2020-03-09 DIAGNOSIS — I351 Nonrheumatic aortic (valve) insufficiency: Secondary | ICD-10-CM

## 2020-03-09 DIAGNOSIS — I10 Essential (primary) hypertension: Secondary | ICD-10-CM

## 2020-03-09 DIAGNOSIS — E119 Type 2 diabetes mellitus without complications: Secondary | ICD-10-CM

## 2020-03-09 MED ORDER — ASPIRIN EC 81 MG PO TBEC: 81.0000 mg | DELAYED_RELEASE_TABLET | Freq: Every day | ORAL | 3 refills | Status: DC

## 2020-03-09 MED ORDER — ROSUVASTATIN CALCIUM 10 MG PO TABS: 5.0000 mg | ORAL_TABLET | Freq: Every day | ORAL | 2 refills | Status: DC

## 2020-03-09 NOTE — Progress Notes (Signed)
Follow up visit  Subjective:   Janice [name], female    DOB: 1951-01-22, 69 y.o.   MRN: [id number]  HPI   Chief Complaint  Patient presents with  . Aortic Stenosis    regurgitation  . Follow-up    40 year    69 year old Caucasian female with hypertension, type 2 DM, moderate AI.  Patient was recently found to have a carotid bruit that led to carotid ultrasound testing.  This showed moderate left carotid artery stenosis.  Patient denies any stroke/TIA symptoms at this time.  She also denies any chest pain symptoms and has mild unchanged exertional dyspnea.  On further questioning, she reports that she had significant anemia in the past that led to discontinuation of aspirin.  At this time, she denies any melena or hematochezia symptoms.   Current Outpatient Medications on File Prior to Visit  Medication Sig Dispense Refill  . acetaminophen (TYLENOL) 500 MG tablet Take 1,000 mg by mouth every 6 (six) hours as needed for moderate pain.     Marland Kitchen albuterol (PROVENTIL HFA;VENTOLIN HFA) 108 (90 Base) MCG/ACT inhaler Inhale 1-2 puffs into the lungs every 6 (six) hours as needed for wheezing or shortness of breath.    Marland Kitchen amLODipine (NORVASC) 10 MG tablet Take 1 tablet by mouth daily.    . Carboxymethylcellul-Glycerin (REFRESH OPTIVE PF OP) Apply 1 drop to eye 3 (three) times daily as needed (dry eyes).    . cetirizine (ZYRTEC) 10 MG tablet Take 10 mg by mouth daily.    Marland Kitchen desoximetasone (TOPICORT) 0.25 % cream Apply 1 application topically 4 (four) times daily as needed (rash (applied to left ear daily)).     . diclofenac sodium (VOLTAREN) 1 % GEL Apply 2 g topically 2 (two) times daily as needed (knee pain).   2  . Flaxseed, Linseed, OIL Take 1,000 mg by mouth daily.     . fluticasone (FLONASE) 50 MCG/ACT nasal spray Place 2 sprays into both nostrils daily.    Marland Kitchen gabapentin (NEURONTIN) 300 MG capsule Take 600 mg by mouth daily with supper.     . insulin NPH Human (HUMULIN N,NOVOLIN N) 100  UNIT/ML injection Inject 40-75 Units into the skin See admin instructions. Inject 75 units subcutaneously at breakfast, 40 units at lunch & 65 units at supper    . insulin regular (NOVOLIN R,HUMULIN R) 100 units/mL injection Inject 30-45 Units into the skin See admin instructions. Inject 30 units subcutaneously in the morning, 30 units at lunch & 30 units at supper    . irbesartan (AVAPRO) 300 MG tablet Take 1 tablet by mouth daily.    . metFORMIN (GLUCOPHAGE XR) 500 MG 24 hr tablet Take 1,000 mg by mouth 2 (two) times daily.     . Methylcobalamin (B12-ACTIVE PO) Take 0.5 tablets by mouth at bedtime.    . Multiple Minerals-Vitamins (CAL MAG ZINC +D3 PO) Take 2 tablets by mouth daily.    Marland Kitchen NAPROSYN 500 MG tablet Take 500 mg by mouth 2 (two) times daily.    Marland Kitchen omeprazole (PRILOSEC) 40 MG capsule Take 40 mg by mouth at bedtime.     Marland Kitchen PARoxetine (PAXIL) 20 MG tablet Take 10 mg by mouth daily.     . rosuvastatin (CRESTOR) 5 MG tablet Take 5 mg by mouth every evening.     . SYMBICORT 160-4.5 MCG/ACT inhaler 2 puffs daily as needed.    Marland Kitchen terconazole (TERAZOL 3) 0.8 % vaginal cream Place 1 applicator vaginally as needed (yeast infections).     Marland Kitchen  vitamin C (ASCORBIC ACID) 500 MG tablet Take 500 mg by mouth 2 (two) times a day.     Marland Kitchen aspirin EC 325 MG EC tablet 1 tab a day for the next 30 days to prevent blood clots 30 tablet 0  . docusate sodium (COLACE) 100 MG capsule 1 tab 2 times a day while on narcotics.  STOOL SOFTENER 60 capsule 0  . oxyCODONE (OXY IR/ROXICODONE) 5 MG immediate release tablet Take 1-2 tablets (5-10 mg total) by mouth every 4 (four) hours as needed for moderate pain (pain score 4-6). 30 tablet 0  . polyethylene glycol (MIRALAX / GLYCOLAX) 17 g packet 17grams in 6 oz of something to drink twice a day until bowel movement.  LAXITIVE.  Restart if two days since last bowel movement 14 each 0   No current facility-administered medications on file prior to visit.    Cardiovascular & other  pertient studies:  EKG 03/09/2020: Sinus rhythm 76 bpm Normal EKG  Carotid artery duplex 01/29/2020:  No evidence of significant stenosis in the right carotid vessels.  Stenosis in the left internal carotid artery (50-69%). Stenosis in the  left external carotid artery (<50%).  Antegrade right vertebral artery flow. Antegrade left vertebral artery  flow.  No prior studies. Follow up in six months is appropriate if clinically  Indicated.  Echocardiogram 02/27/2019:  Normal LV systolic function with EF 67%. Left ventricle cavity is normal  in size. Moderate concentric hypertrophy of the left ventricle. Normal  global wall motion. Doppler evidence of grade I (impaired) diastolic  dysfunction, normal LAP.  Left atrial cavity is mildly dilated.  Trileaflet aortic valve. Moderate (Grade II) aortic regurgitation.  Mild tricuspid regurgitation.  No evidence of pulmonary hypertension.  [place] Stress Test 02/18/2019: Stress EKG is non-diagnostic, as this is pharmacological stress test. Normal myocardial perfusion. Stress LV EF: 50%.  Low risk stress test.   Recent labs: 12/31/2019: Glucose BUN/Cr 10/0.8. eGFR 71 HbA1C 6.7%  09/2018: Chol 151, TG 164, HDL 33, LDL 85 TSH 1.9 normal   Review of Systems  Cardiovascular: Positive for leg swelling. Negative for chest pain, dyspnea on exertion, palpitations and syncope.         Vitals:   03/09/20 1358  BP: (!) 138/51  Pulse: 74  Resp: 17  SpO2: 95%     Body mass index is 39.99 kg/m. Filed Weights   03/09/20 1358  Weight: 233 lb (105.7 kg)     Objective:   Physical Exam Vitals and nursing note reviewed.  Constitutional:      General: She is not in acute distress. Neck:     Vascular: No JVD.  Cardiovascular:     Rate and Rhythm: Normal rate and regular rhythm.     Pulses: Intact distal pulses.          Carotid pulses are on the left side with bruit.    Heart sounds: Normal heart sounds. No murmur  heard.   Pulmonary:     Effort: Pulmonary effort is normal.     Breath sounds: Normal breath sounds. No wheezing or rales.  Musculoskeletal:     Right lower leg: Edema (1+) present.     Left lower leg: Edema (1+) present.         Assessment & Recommendations:   69 year old Caucasian female with hypertension, type 2 DM, moderate AI, asymptomatic left carotid artery stenosis  Carotid artery stenosis: Moderate, left, asymptomatic.  Recommend aggressive medical management. I have cautiously started on aspirin 81  mg daily.  Recommend CBC at baseline and repeat in September with PCP Dr. Dagmar Hait. Increase Crestor 10 mg daily.  She recalls there were some side effects with high-dose Crestor in the past, but unable to recollect.  Reasonable to rechallenge. We will recheck carotid ultrasound in November.  Leg edema: I suspect this is related to amlodipine in setting of no other exertional dyspnea symptoms.  Aortic regurgitation: Moderate, clinically stable.  Will check echocardiogram at the time of repeat carotid ultrasound in a few months.    Nigel Mormon, MD [facility] Pager: [phone] Office: [phone]

## 2020-03-11 DIAGNOSIS — I6522 Occlusion and stenosis of left carotid artery: Secondary | ICD-10-CM | POA: Diagnosis not present

## 2020-03-12 LAB — CBC
Hematocrit: 40.1 % (ref 34.0–46.6)
Hemoglobin: 12.8 g/dL (ref 11.1–15.9)
MCH: 27.7 pg (ref 26.6–33.0)
MCHC: 31.9 g/dL (ref 31.5–35.7)
MCV: 87 fL (ref 79–97)
Platelets: 99 10*3/uL — CL (ref 150–450)
RBC: 4.62 x10E6/uL (ref 3.77–5.28)
RDW: 14 % (ref 11.7–15.4)
WBC: 9.7 10*3/uL (ref 3.4–10.8)

## 2020-03-13 ENCOUNTER — Telehealth: Payer: Self-pay

## 2020-03-13 NOTE — Telephone Encounter (Signed)
Patient called in regards to recent lab work. Labs were resulted and are stable per your note. Can patient continue Aspirin 81?

## 2020-03-14 NOTE — Telephone Encounter (Signed)
Yes. Hemoglobin is 12. Okay to take Aspirin.

## 2020-03-18 ENCOUNTER — Ambulatory Visit
Admission: RE | Admit: 2020-03-18 | Discharge: 2020-03-18 | Disposition: A | Discharge status: Home / Self Care | Payer: Medicare HMO | Source: Ambulatory Visit | Attending: Obstetrics and Gynecology | Admitting: Obstetrics and Gynecology

## 2020-03-18 ENCOUNTER — Other Ambulatory Visit: Payer: Self-pay | Admitting: Obstetrics and Gynecology

## 2020-03-18 ENCOUNTER — Other Ambulatory Visit: Payer: Self-pay

## 2020-03-18 DIAGNOSIS — N6489 Other specified disorders of breast: Secondary | ICD-10-CM | POA: Diagnosis not present

## 2020-03-18 DIAGNOSIS — R928 Other abnormal and inconclusive findings on diagnostic imaging of breast: Secondary | ICD-10-CM

## 2020-03-18 DIAGNOSIS — R599 Enlarged lymph nodes, unspecified: Secondary | ICD-10-CM

## 2020-03-19 ENCOUNTER — Other Ambulatory Visit: Payer: Self-pay

## 2020-03-19 ENCOUNTER — Telehealth: Payer: Self-pay

## 2020-03-19 NOTE — Telephone Encounter (Signed)
Mistake by me. Please change prescription to 10 mg 1 tab daily and let patient know.  Thanks MJP

## 2020-03-19 NOTE — Telephone Encounter (Signed)
Patient recently picked up RX for Rosuvastatin 10 mg, however the directions say to take 1/2 tablet daily. That goes back to her original dose. Should patient take 5 mg of Rosuvastatin or 10mg ? Please advise. Thanks!

## 2020-03-20 ENCOUNTER — Other Ambulatory Visit: Payer: Self-pay

## 2020-03-20 MED ORDER — ROSUVASTATIN CALCIUM 10 MG PO TABS: 10.0000 mg | ORAL_TABLET | Freq: Every day | ORAL | 0 refills | Status: DC

## 2020-03-30 ENCOUNTER — Ambulatory Visit
Admission: RE | Admit: 2020-03-30 | Discharge: 2020-03-30 | Disposition: A | Discharge status: Home / Self Care | Payer: Medicare HMO | Source: Ambulatory Visit | Attending: Obstetrics and Gynecology | Admitting: Obstetrics and Gynecology

## 2020-03-30 ENCOUNTER — Other Ambulatory Visit: Payer: Self-pay

## 2020-03-30 ENCOUNTER — Other Ambulatory Visit: Payer: Self-pay | Admitting: Obstetrics and Gynecology

## 2020-03-30 DIAGNOSIS — R599 Enlarged lymph nodes, unspecified: Secondary | ICD-10-CM | POA: Diagnosis not present

## 2020-03-30 DIAGNOSIS — C8304 Small cell B-cell lymphoma, lymph nodes of axilla and upper limb: Secondary | ICD-10-CM | POA: Diagnosis not present

## 2020-03-30 DIAGNOSIS — R590 Localized enlarged lymph nodes: Secondary | ICD-10-CM | POA: Diagnosis not present

## 2020-03-31 DIAGNOSIS — N900 Mild vulvar dysplasia: Secondary | ICD-10-CM | POA: Diagnosis not present

## 2020-03-31 DIAGNOSIS — N9089 Other specified noninflammatory disorders of vulva and perineum: Secondary | ICD-10-CM | POA: Diagnosis not present

## 2020-04-01 LAB — SURGICAL PATHOLOGY

## 2020-04-02 ENCOUNTER — Other Ambulatory Visit: Payer: Self-pay

## 2020-04-02 MED ORDER — ROSUVASTATIN CALCIUM 10 MG PO TABS: 10.0000 mg | ORAL_TABLET | Freq: Every day | ORAL | 1 refills | Status: DC

## 2020-04-08 ENCOUNTER — Inpatient Hospital Stay: Payer: Medicare HMO | Attending: Hematology | Admitting: Hematology

## 2020-04-08 ENCOUNTER — Other Ambulatory Visit: Payer: Self-pay

## 2020-04-08 ENCOUNTER — Inpatient Hospital Stay: Payer: Medicare HMO

## 2020-04-08 VITALS — BP 149/50 | HR 76 | Temp 98.8°F | Resp 18 | Ht 64.0 in | Wt 231.4 lb

## 2020-04-08 DIAGNOSIS — R109 Unspecified abdominal pain: Secondary | ICD-10-CM | POA: Diagnosis not present

## 2020-04-08 DIAGNOSIS — Z808 Family history of malignant neoplasm of other organs or systems: Secondary | ICD-10-CM | POA: Insufficient documentation

## 2020-04-08 DIAGNOSIS — R1012 Left upper quadrant pain: Secondary | ICD-10-CM

## 2020-04-08 DIAGNOSIS — M1990 Unspecified osteoarthritis, unspecified site: Secondary | ICD-10-CM | POA: Diagnosis not present

## 2020-04-08 DIAGNOSIS — D696 Thrombocytopenia, unspecified: Secondary | ICD-10-CM | POA: Diagnosis not present

## 2020-04-08 DIAGNOSIS — I10 Essential (primary) hypertension: Secondary | ICD-10-CM | POA: Insufficient documentation

## 2020-04-08 DIAGNOSIS — C9110 Chronic lymphocytic leukemia of B-cell type not having achieved remission: Secondary | ICD-10-CM | POA: Insufficient documentation

## 2020-04-08 DIAGNOSIS — E119 Type 2 diabetes mellitus without complications: Secondary | ICD-10-CM | POA: Diagnosis not present

## 2020-04-08 DIAGNOSIS — Z803 Family history of malignant neoplasm of breast: Secondary | ICD-10-CM | POA: Insufficient documentation

## 2020-04-08 DIAGNOSIS — C8300 Small cell B-cell lymphoma, unspecified site: Secondary | ICD-10-CM | POA: Diagnosis not present

## 2020-04-08 DIAGNOSIS — Z8249 Family history of ischemic heart disease and other diseases of the circulatory system: Secondary | ICD-10-CM | POA: Diagnosis not present

## 2020-04-08 DIAGNOSIS — Z79899 Other long term (current) drug therapy: Secondary | ICD-10-CM | POA: Insufficient documentation

## 2020-04-08 LAB — CMP (CANCER CENTER ONLY)
ALT: 32 U/L (ref 0–44)
AST: 23 U/L (ref 15–41)
Albumin: 4.4 g/dL (ref 3.5–5.0)
Alkaline Phosphatase: 97 U/L (ref 38–126)
Anion gap: 9 (ref 5–15)
BUN: 9 mg/dL (ref 8–23)
CO2: 27 mmol/L (ref 22–32)
Calcium: 10.3 mg/dL (ref 8.9–10.3)
Chloride: 106 mmol/L (ref 98–111)
Creatinine: 0.83 mg/dL (ref 0.44–1.00)
GFR, Est AFR Am: >60 mL/min (ref >60)
GFR, Estimated: >60 mL/min (ref >60)
Glucose, Bld: 106 mg/dL — ABNORMAL HIGH (ref 70–99)
Potassium: 4.2 mmol/L (ref 3.5–5.1)
Sodium: 142 mmol/L (ref 135–145)
Total Bilirubin: 0.5 mg/dL (ref 0.3–1.2)
Total Protein: 6.8 g/dL (ref 6.5–8.1)

## 2020-04-08 LAB — CBC WITH DIFFERENTIAL/PLATELET
Abs Immature Granulocytes: 0.04 10*3/uL (ref 0.00–0.07)
Basophils Absolute: 0.1 10*3/uL (ref 0.0–0.1)
Basophils Relative: 1 %
Eosinophils Absolute: 0.2 10*3/uL (ref 0.0–0.5)
Eosinophils Relative: 2 %
HCT: 39.8 % (ref 36.0–46.0)
Hemoglobin: 12.4 g/dL (ref 12.0–15.0)
Immature Granulocytes: 0 %
Lymphocytes Relative: 53 %
Lymphs Abs: 5.6 10*3/uL — ABNORMAL HIGH (ref 0.7–4.0)
MCH: 27.3 pg (ref 26.0–34.0)
MCHC: 31.2 g/dL (ref 30.0–36.0)
MCV: 87.5 fL (ref 80.0–100.0)
Monocytes Absolute: 0.7 10*3/uL (ref 0.1–1.0)
Monocytes Relative: 7 %
Neutro Abs: 3.8 10*3/uL (ref 1.7–7.7)
Neutrophils Relative %: 37 %
Platelets: 93 10*3/uL — ABNORMAL LOW (ref 150–400)
RBC: 4.55 MIL/uL (ref 3.87–5.11)
RDW: 14.6 % (ref 11.5–15.5)
WBC: 10.3 10*3/uL (ref 4.0–10.5)
nRBC: 0 % (ref 0.0–0.2)

## 2020-04-08 LAB — LACTATE DEHYDROGENASE: LDH: 206 U/L — ABNORMAL HIGH (ref 98–192)

## 2020-04-08 LAB — IMMATURE PLATELET FRACTION: Immature Platelet Fraction: 6.4 % (ref 1.2–8.6)

## 2020-04-08 NOTE — Progress Notes (Signed)
HEMATOLOGY/ONCOLOGY CONSULTATION NOTE  Date of Service: 04/08/2020  Patient Care Team: Prince Solian, MD as PCP - General (Internal Medicine)  CHIEF COMPLAINTS/PURPOSE OF CONSULTATION:  Newly diagnosed CLL/SLL  HISTORY OF PRESENTING ILLNESS:   Janice [name] is a wonderful 69 y.o. female who has been referred to [place] by Dr. 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 The pt reports that she is doing well overall.   The pt reports that her last mammogram was a part of routine screening. She had no constitutional symptoms prior and while she has felt differently over the last year she believes this was due to her other medical concerns. Pt reports sharp, intermittent abdominal pain.   Pt was supposed to have a knee replacement in 2019, but couldn't due to iron deficiency anemia. She was able to complete her left knee replacement in mid-2020. Pt contracted the [place] virus in 2020. Pt received her COVID19 vaccines in May.   Pt uses Diclofenac topically for her left arm pain and left knee pain. She has had intermittent rashes that arise in the areas that she receives her insulin injections.   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   Of note prior to the patient's visit today, pt has had MM Breast ([id number]) completed on 02/28/2020 with results revealing "Bilateral axillary adenopathy."  Pt has had Left axilla lymph node biopsy ([id number]) completed on 03/30/2020 with results revealing "CHRONIC LYMPHOCYTIC LYMPHOMA/SMALL LYMPHOCYTIC LYMPHOMA".   Pt has had Left Axilla Flow Pathology Report [id number]) completed on 03/30/2020 with results revealing "Monoclonal B-cell population with coexpression of CD5 comprises 80% of all lymphocytes".  Most recent lab results (03/11/2020) of CBC is as follows: all values are WNL except for PLT at 99K.  On review of systems, pt reports rash, watery stools, abdominal pain and denies fevers,  chills, night sweats, unexpected weight loss and any other symptoms.   On PMHx the pt reports Type II Diabetes, DJD, HLD, HTN, Iron deficiency anemia, Arthritis, Total knee arthroplasty.  On Social Hx the pt reports that she is a non-smoker.  On Family Hx the pt reports that her mother was diagnosed with breast cancer at 42. She also had two maternal aunts who had breast cancer.   MEDICAL HISTORY:  Past Medical History:  Diagnosis Date  . Allergy   . Asthma    seasonal  . Cellulitis 2013   Left toe  . Common migraine    History of  . Complication of anesthesia    Per pt/had breathing problems with "block" during rotator cuff surgery. 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  . DDD (degenerative disc disease)   . Depression    denies takes paxil for migraines  . Diabetes mellitus    type 2  . Diabetic peripheral neuropathy ([place])   . Diverticulitis   . Diverticulosis   . DJD (degenerative joint disease)   . GERD (gastroesophageal reflux disease)   . History of colon polyps    hyperplastic  . History of gastric polyp   . Hyperlipidemia   . Hypertension   . Iron deficiency anemia   . Obesity   . OSA on CPAP    cpap  . Pneumonia    april 2020  mild  . Primary localized osteoarthritis of left knee 03/27/2019  . Sensorineural hearing loss     SURGICAL HISTORY: Past Surgical History:  Procedure Laterality Date  . ANKLE SURGERY     Left   .  COLONOSCOPY  05/2018  . LEEP N/A 09/14/2018   Procedure: LOOP ELECTROSURGICAL EXCISION PROCEDURE (LEEP);  Surgeon: Arvella Nigh, MD;  Location: [facility];  Service: Gynecology;  Laterality: N/A;  . ROTATOR CUFF REPAIR Bilateral 2012, 2015  . TONSILLECTOMY    . TOTAL KNEE ARTHROPLASTY Left 04/08/2019   Procedure: TOTAL KNEE ARTHROPLASTY;  Surgeon: Elsie Saas, MD;  Location: [facility];  Service: Orthopedics;  Laterality: Left;  . UPPER GI ENDOSCOPY  05/2018    SOCIAL HISTORY: Social History   Socioeconomic History  .  Marital status: Married    Spouse name: Not on file  . Number of children: 0  . Years of education: 3 years college  . Highest education level: Not on file  Occupational History  . Occupation: Unemployed  Tobacco Use  . Smoking status: Never Smoker  . Smokeless tobacco: Never Used  Vaping Use  . Vaping Use: Never used  Substance and Sexual Activity  . Alcohol use: Not Currently    Comment: very rare  . Drug use: No  . Sexual activity: Not Currently    Birth control/protection: None, Post-menopausal  Other Topics Concern  . Not on file  Social History Narrative   1 caffeine drink daily    Right-handed   Lives at home with husband.   Social Determinants of Health   Financial Resource Strain:   . Difficulty of Paying Living Expenses:   Food Insecurity:   . Worried About Charity fundraiser in the Last Year:   . Arboriculturist in the Last Year:   Transportation Needs:   . Film/video editor (Medical):   Marland Kitchen Lack of Transportation (Non-Medical):   Physical Activity:   . Days of Exercise per Week:   . Minutes of Exercise per Session:   Stress:   . Feeling of Stress :   Social Connections:   . Frequency of Communication with Friends and Family:   . Frequency of Social Gatherings with Friends and Family:   . Attends Religious Services:   . Active Member of Clubs or Organizations:   . Attends Archivist Meetings:   Marland Kitchen Marital Status:   Intimate Partner Violence:   . Fear of Current or Ex-Partner:   . Emotionally Abused:   Marland Kitchen Physically Abused:   . Sexually Abused:     FAMILY HISTORY: Family History  Problem Relation Age of Onset  . Breast cancer Mother   . Bone cancer Mother   . Heart failure Father   . Colon cancer Neg Hx   . Rectal cancer Neg Hx   . Stomach cancer Neg Hx   . Esophageal cancer Neg Hx   . Pancreatic cancer Neg Hx   . Liver disease Neg Hx     ALLERGIES:  is allergic to breo ellipta [fluticasone furoate-vilanterol], iodine, latex,  tessalon perles [benzonatate], zocor [simvastatin], phenergan [promethazine], wellbutrin [bupropion], amoxicillin, chlorthalidone, diflucan [fluconazole], doxycycline, and levofloxacin.  MEDICATIONS:  Current Outpatient Medications  Medication Sig Dispense Refill  . acetaminophen (TYLENOL) 500 MG tablet Take 1,000 mg by mouth every 6 (six) hours as needed for moderate pain.     Marland Kitchen albuterol (PROVENTIL HFA;VENTOLIN HFA) 108 (90 Base) MCG/ACT inhaler Inhale 1-2 puffs into the lungs every 6 (six) hours as needed for wheezing or shortness of breath.    Marland Kitchen amLODipine (NORVASC) 10 MG tablet Take 1 tablet by mouth daily.    Marland Kitchen aspirin EC 81 MG tablet Take 1 tablet (81 mg total) by  mouth daily. Swallow whole. 90 tablet 3  . Carboxymethylcellul-Glycerin (REFRESH OPTIVE PF OP) Apply 1 drop to eye 3 (three) times daily as needed (dry eyes).    . cetirizine (ZYRTEC) 10 MG tablet Take 10 mg by mouth daily.    Marland Kitchen desoximetasone (TOPICORT) 0.25 % cream Apply 1 application topically 4 (four) times daily as needed (rash (applied to left ear daily)).     . diclofenac sodium (VOLTAREN) 1 % GEL Apply 2 g topically 2 (two) times daily as needed (knee pain).   2  . docusate sodium (COLACE) 100 MG capsule 1 tab 2 times a day while on narcotics.  STOOL SOFTENER 60 capsule 0  . Flaxseed, Linseed, OIL Take 1,000 mg by mouth daily.     . fluticasone (FLONASE) 50 MCG/ACT nasal spray Place 2 sprays into both nostrils daily.    Marland Kitchen gabapentin (NEURONTIN) 300 MG capsule Take 600 mg by mouth daily with supper.     . insulin NPH Human (HUMULIN N,NOVOLIN N) 100 UNIT/ML injection Inject 40-75 Units into the skin See admin instructions. Inject 75 units subcutaneously at breakfast, 40 units at lunch & 65 units at supper    . insulin regular (NOVOLIN R,HUMULIN R) 100 units/mL injection Inject 30-45 Units into the skin See admin instructions. Inject 30 units subcutaneously in the morning, 30 units at lunch & 30 units at supper    .  irbesartan (AVAPRO) 300 MG tablet Take 1 tablet by mouth daily.    . metFORMIN (GLUCOPHAGE XR) 500 MG 24 hr tablet Take 1,000 mg by mouth 2 (two) times daily.     . Methylcobalamin (B12-ACTIVE PO) Take 0.5 tablets by mouth at bedtime.    . Multiple Minerals-Vitamins (CAL MAG ZINC +D3 PO) Take 2 tablets by mouth daily.    Marland Kitchen NAPROSYN 500 MG tablet Take 500 mg by mouth 2 (two) times daily.    Marland Kitchen omeprazole (PRILOSEC) 40 MG capsule Take 40 mg by mouth at bedtime.     Marland Kitchen oxyCODONE (OXY IR/ROXICODONE) 5 MG immediate release tablet Take 1-2 tablets (5-10 mg total) by mouth every 4 (four) hours as needed for moderate pain (pain score 4-6). 30 tablet 0  . PARoxetine (PAXIL) 20 MG tablet Take 10 mg by mouth daily.     . polyethylene glycol (MIRALAX / GLYCOLAX) 17 g packet 17grams in 6 oz of something to drink twice a day until bowel movement.  LAXITIVE.  Restart if two days since last bowel movement 14 each 0  . rosuvastatin (CRESTOR) 10 MG tablet Take 1 tablet (10 mg total) by mouth daily. 90 tablet 1  . SYMBICORT 160-4.5 MCG/ACT inhaler 2 puffs daily as needed.    Marland Kitchen terconazole (TERAZOL 3) 0.8 % vaginal cream Place 1 applicator vaginally as needed (yeast infections).     . vitamin C (ASCORBIC ACID) 500 MG tablet Take 500 mg by mouth 2 (two) times a day.      No current facility-administered medications for this visit.    REVIEW OF SYSTEMS:    10 Point review of Systems was done is negative except as noted above.  PHYSICAL EXAMINATION: ECOG PERFORMANCE STATUS: 1 - Symptomatic but completely ambulatory  . Vitals:   04/08/20 1327  BP: (!) 149/50  Pulse: 76  Resp: 18  Temp: 98.8 F (37.1 C)  SpO2: 97%   Filed Weights   04/08/20 1327  Weight: (!) 231 lb 6.4 oz (105 kg)   .Body mass index is 39.72 kg/m.  GENERAL:alert, in  no acute distress and comfortable SKIN: no acute rashes, no significant lesions EYES: conjunctiva are pink and non-injected, sclera anicteric OROPHARYNX: MMM, no  exudates, no oropharyngeal erythema or ulceration NECK: supple, no JVD LYMPH:  no palpable lymphadenopathy in the cervical or inguinal regions. Lymphadenopathy in axillary b/l.  LUNGS: clear to auscultation b/l with normal respiratory effort HEART: regular rate & rhythm ABDOMEN:  normoactive bowel sounds , non tender, not distended. No clear splenomegaly.  Extremity: trace pedal edema b/l PSYCH: alert & oriented x 3 with fluent speech NEURO: no focal motor/sensory deficits  LABORATORY DATA:  I have reviewed the data as listed  . CBC Latest Ref Rng & Units 04/08/2020 03/11/2020 04/09/2019  WBC 4.0 - 10.5 K/uL 10.3 9.7 16.6(H)  Hemoglobin 12.0 - 15.0 g/dL 12.4 12.8 10.6(L)  Hematocrit 36 - 46 % 39.8 40.1 35.3(L)  Platelets 150 - 400 K/uL 93(L) 99(LL) 79(L)   . CBC    Component Value Date/Time   WBC 10.3 04/08/2020 1435   RBC 4.55 04/08/2020 1435   HGB 12.4 04/08/2020 1435   HGB 12.8 03/11/2020 1315   HCT 39.8 04/08/2020 1435   HCT 40.1 03/11/2020 1315   PLT 93 (L) 04/08/2020 1435   PLT 99 (LL) 03/11/2020 1315   MCV 87.5 04/08/2020 1435   MCV 87 03/11/2020 1315   MCH 27.3 04/08/2020 1435   MCHC 31.2 04/08/2020 1435   RDW 14.6 04/08/2020 1435   RDW 14.0 03/11/2020 1315   LYMPHSABS 5.6 (H) 04/08/2020 1435   MONOABS 0.7 04/08/2020 1435   EOSABS 0.2 04/08/2020 1435   BASOSABS 0.1 04/08/2020 1435     . CMP Latest Ref Rng & Units 04/08/2020 04/09/2019 03/29/2019  Glucose 70 - 99 mg/dL 106(H) 320(H) 105(H)  BUN 8 - 23 mg/dL '9 15 15  '$ Creatinine 0.44 - 1.00 mg/dL 0.83 0.90 0.68  Sodium 135 - 145 mmol/L 142 130(L) 136  Potassium 3.5 - 5.1 mmol/L 4.2 5.7(H) 4.2  Chloride 98 - 111 mmol/L 106 99 99  CO2 22 - 32 mmol/L '27 23 25  '$ Calcium 8.9 - 10.3 mg/dL 10.3 8.5(L) 9.0  Total Protein 6.5 - 8.1 g/dL 6.8 - 7.1  Total Bilirubin 0.3 - 1.2 mg/dL 0.5 - 0.6  Alkaline Phos 38 - 126 U/L 97 - 92  AST 15 - 41 U/L 23 - 24  ALT 0 - 44 U/L 32 - 38   03/30/2020 Left Axilla Flow Pathology  Report [id number]):   03/30/2020 Left axilla lymph node Bx ([id number]):   RADIOGRAPHIC STUDIES: I have personally reviewed the radiological images as listed and agreed with the findings in the report. [place] AXILLARY NODE CORE BIOPSY LEFT  Addendum Date: 04/02/2020   ADDENDUM REPORT: 04/02/2020 09:16 ADDENDUM: Pathology revealed: CHRONIC LYMPHOCYTIC LYMPHOMA/SMALL LYMPHOCYTIC LYMPHOMA of the LEFT axillary lymph node. This was found to be concordant by Dr. Lovey Newcomer. Flow Cytometry revealed: a lambda-restricted B-cell population (80% of lymphocytes) with expression of CD5 and CD23 (See [phone]). Overall, the morphology and immunophenotype are consistent with chronic lymphocytic leukemia/small lymphocytic lymphoma. The patient reported doing well after the biopsy with tenderness at the site. Post biopsy instructions and care were reviewed and questions were answered. The patient was encouraged to call [facility] for any additional concerns. Pathology results were called to [place] for Dr. Arvella Nigh at [facility] who will notify patient of results and arrange referral to Hematologist/Oncologist. Pathology results reported by Stacie Acres RN on 04/02/2020. Electronically  Signed   By: Lovey Newcomer M.D.   On: 04/02/2020 09:16   Result Date: 04/02/2020 CLINICAL DATA:  Patient with cortically thickened left axillary lymph node. EXAM: [place] AXILLARY NODE CORE BIOPSY LEFT COMPARISON:  Previous exam(s). PROCEDURE: I met with the patient and we discussed the procedure of ultrasound-guided biopsy, including benefits and alternatives. We discussed the high likelihood of a successful procedure. We discussed the risks of the procedure, including infection, bleeding, tissue injury, clip migration, and inadequate sampling. Informed written consent was given. The usual time-out protocol was performed immediately prior to the procedure. Using sterile technique  and 1% Lidocaine as local anesthetic, under direct ultrasound visualization, a 14 gauge spring-loaded device was used to perform biopsy of left axillary lymph node using a lateral approach. At the conclusion of the procedure [facility] tissue marker clip was deployed into the biopsy cavity. Follow up 2 view mammogram was performed and dictated separately. IMPRESSION: Ultrasound guided biopsy of left axillary lymph node. No apparent complications. Electronically Signed: By: Lovey Newcomer M.D. On: 03/30/2020 13:27   [place] AXILLA LEFT  Result Date: 03/18/2020 CLINICAL DATA:  The patient was called back from screening mammography due to bilateral axillary adenopathy. The patient had a COVID-19 vaccine on the right May 7th of 2021. The first vaccine was on the left approximately 5 weeks earlier, greater than 90 days from now. EXAM: ULTRASOUND OF THE BILATERAL BREAST COMPARISON:  Previous exam(s). FINDINGS: On physical exam, no suspicious lumps are identified. Targeted ultrasound is performed, showing bilateral axillary adenopathy. The most abnormal lymph node on the right is immediately adjacent to a major vessel. There are at least 4 abnormal lymph nodes on the right. There are 3 abnormal lymph nodes on the left. IMPRESSION: Bilateral axillary adenopathy. RECOMMENDATION: Recommend ultrasound-guided biopsy of a left-sided axillary lymph node. I have discussed the findings and recommendations with the patient. If applicable, a reminder letter will be sent to the patient regarding the next appointment. BI-RADS CATEGORY  4: Suspicious. Electronically Signed   By: Dorise Bullion III M.D   On: 03/18/2020 13:32   [place] AXILLA RIGHT  Result Date: 03/18/2020 CLINICAL DATA:  The patient was called back from screening mammography due to bilateral axillary adenopathy. The patient had a COVID-19 vaccine on the right May 7th of 2021. The first vaccine was on the left approximately 5 weeks earlier, greater than 90 days from now. EXAM:  ULTRASOUND OF THE BILATERAL BREAST COMPARISON:  Previous exam(s). FINDINGS: On physical exam, no suspicious lumps are identified. Targeted ultrasound is performed, showing bilateral axillary adenopathy. The most abnormal lymph node on the right is immediately adjacent to a major vessel. There are at least 4 abnormal lymph nodes on the right. There are 3 abnormal lymph nodes on the left. IMPRESSION: Bilateral axillary adenopathy. RECOMMENDATION: Recommend ultrasound-guided biopsy of a left-sided axillary lymph node. I have discussed the findings and recommendations with the patient. If applicable, a reminder letter will be sent to the patient regarding the next appointment. BI-RADS CATEGORY  4: Suspicious. 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  -Discussed Left Axilla Flow Pathology Report [id number]) completed on with results revealing "Monoclonal B-cell population with coexpression of CD5 comprises 80% of all lymphocytes". -Advised pt that SLL is typically an indolent disease that is found in older populations and is picked up incidentally due to its lack of symptomology.  -Advised pt that although SLL is treatable, there is no clinical evidence to suggest better health outcomes if treated earlier than necessary.  -Advised pt that we would move to treat if disease creates cytopenias, constitutional symptoms, bulky disease, or threatened end organs.  -Advised pt that CLL/SLL can increase the  risk of non-melanoma skin cancers. Recommend routine f/u with Dermatology.  -Discussed pt receiving genetic counseling given her mother's history of breast cancer - pt unsure if she would like to proceed at this time.  -Will continue to monitor thrombocytopenia  -Recommend pt f/u with Dr. Matthew Saras as scheduled -Will send out genetic testing (CLL FISH panel) -Will get labs today  -Will get CT C/A/P in 1 week   -Will see back in 2 weeks via phone    FOLLOW UP: Labs today CT chest/abd/pelvis in 1 week Phone visit with Dr Irene Limbo in 2 weeks   All of the patients questions were answered with apparent satisfaction. The patient knows to call the clinic with any problems, questions or concerns.  I spent 45 mins counseling the patient face to face. The total time spent in the appointment was 60 minutes and more than 50% was on counseling and direct patient cares.    Sullivan Lone MD [place] AAHIVMS [facility] Hematology/Oncology Physician [facility]  (Office):       [phone] (Work cell):  [phone] (Fax):           [phone]  04/08/2020 2:24 PM  I, Yevette Edwards, am acting as a scribe for Dr. Sullivan Lone.   .I have reviewed the above documentation for accuracy and completeness, and I agree with the above. Brunetta Genera MD

## 2020-04-08 NOTE — Patient Instructions (Signed)
Thank you for choosing [facility] to provide your oncology and hematology care.   Should you have questions after your visit to the [facility] ([facility]), please contact this office at [phone] between 8:30 AM and 4:30 PM.  Voice mails left after 4:00 PM may not be returned until the following business day.  Calls received after 4:30 PM will be answered by an off-site Nurse Triage Line.    Prescription Refills:  Please have your pharmacy contact us directly for most prescription requests.  Contact the office directly for refills of narcotics (pain medications). Allow 48-72 hours for refills.  Appointments: Please contact the [facility] scheduling department [phone] for questions regarding [facility] appointment scheduling.  Contact the schedulers with any scheduling changes so that your appointment can be rescheduled in a timely manner.   Central Scheduling for [place] [phone] - Call to schedule procedures such as PET scans, CT scans, MRI, Ultrasound, etc.  To afford each patient quality time with our providers, please arrive 30 minutes before your scheduled appointment time.  If you arrive late for your appointment, you may be asked to reschedule.  We strive to give you quality time with our providers, and arriving late affects you and other patients whose appointments are after yours. If you are a no show for multiple scheduled visits, you may be dismissed from the clinic at the providers discretion.     Resources: [facility] Social Workers [phone] for additional information on assistance programs or assistance connecting with community support programs   [place] DSS  336-641-3447: Information regarding food stamps, Medicaid, and utility assistance [place] [employer] [place] 336-333-6589   [place] [employer]'s shared-ride transportation service for eligible riders who have a disability that prevents them from riding the fixed route bus.   Medicare  Rights Center 800-333-4114 Helps people with Medicare understand their rights and benefits, navigate the Medicare system, and secure the quality healthcare they deserve [employer] 800-227-2345 Assists patients locate various types of support and financial assistance Cancer Care: 1-800-813-HOPE (4673) Provides financial assistance, online support groups, medication/co-pay assistance.   Transportation Assistance for appointments at [facility]: Transportation Coordinator [phone]  Again, thank you for choosing [facility] for your care.       

## 2020-04-09 DIAGNOSIS — D398 Neoplasm of uncertain behavior of other specified female genital organs: Secondary | ICD-10-CM | POA: Diagnosis not present

## 2020-04-09 DIAGNOSIS — N766 Ulceration of vulva: Secondary | ICD-10-CM | POA: Diagnosis not present

## 2020-04-09 DIAGNOSIS — N900 Mild vulvar dysplasia: Secondary | ICD-10-CM | POA: Diagnosis not present

## 2020-04-09 LAB — IGG: IgG (Immunoglobin G), Serum: 451 mg/dL — ABNORMAL LOW (ref 586–1602)

## 2020-04-13 DIAGNOSIS — M19012 Primary osteoarthritis, left shoulder: Secondary | ICD-10-CM | POA: Diagnosis not present

## 2020-04-13 DIAGNOSIS — M25562 Pain in left knee: Secondary | ICD-10-CM | POA: Diagnosis not present

## 2020-04-15 ENCOUNTER — Telehealth: Payer: Self-pay | Admitting: *Deleted

## 2020-04-15 ENCOUNTER — Other Ambulatory Visit: Payer: Self-pay | Admitting: Hematology

## 2020-04-15 ENCOUNTER — Encounter: Payer: Self-pay | Admitting: *Deleted

## 2020-04-15 DIAGNOSIS — C8300 Small cell B-cell lymphoma, unspecified site: Secondary | ICD-10-CM

## 2020-04-15 NOTE — Telephone Encounter (Signed)
Patient called. CT is scheduled for 8/20. Patient has allergy to Iodine. Was told to contact this office for prescriptions for "13 hour prep"/prednisone &Benadryl. Dr. Irene Limbo informed. Contacted patient with Dr. Irene Limbo response: She has angioedema and hives with iodinated contrast-- will cancel CTs and have ordered PET/CT instead . Will need to let  patient know about the change. Patient verbalized understanding. Advised her that Radiology scheduling will contact her to schedule PET/CT. CT scan cancelled by Ginger in radiology

## 2020-04-15 NOTE — Progress Notes (Signed)
Patient has significant allergies to IV Iodinated contrast -- will switch CT C/A/P to PET/CT scan. New order placed.

## 2020-04-16 ENCOUNTER — Other Ambulatory Visit: Payer: Self-pay | Admitting: *Deleted

## 2020-04-16 DIAGNOSIS — C8300 Small cell B-cell lymphoma, unspecified site: Secondary | ICD-10-CM

## 2020-04-16 LAB — FISH,CLL PROGNOSTIC PANEL

## 2020-04-17 ENCOUNTER — Telehealth: Payer: Self-pay | Admitting: Hematology

## 2020-04-17 NOTE — Telephone Encounter (Signed)
Scheduled per 07/28 los, patient has been called and notified of upcoming appointment.

## 2020-04-20 ENCOUNTER — Inpatient Hospital Stay: Payer: Medicare HMO | Attending: Hematology | Admitting: Hematology

## 2020-04-20 ENCOUNTER — Other Ambulatory Visit: Payer: Self-pay

## 2020-04-20 DIAGNOSIS — D696 Thrombocytopenia, unspecified: Secondary | ICD-10-CM

## 2020-04-20 DIAGNOSIS — C8300 Small cell B-cell lymphoma, unspecified site: Secondary | ICD-10-CM

## 2020-04-20 NOTE — Progress Notes (Signed)
HEMATOLOGY/ONCOLOGY CLINIC NOTE  Date of Service: 04/20/2020  Patient Care Team: Prince Solian, MD as PCP - General (Internal Medicine)  CHIEF COMPLAINTS/PURPOSE OF CONSULTATION:  Newly diagnosed CLL/SLL  HISTORY OF PRESENTING ILLNESS:   Janice [name] is a wonderful 69 y.o. female who has been referred to [place] by Dr. 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 The pt reports that she is doing well overall.   The pt reports that her last mammogram was a part of routine screening. She had no constitutional symptoms prior and while she has felt differently over the last year she believes this was due to her other medical concerns. Pt reports sharp, intermittent abdominal pain.   Pt was supposed to have a knee replacement in 2019, but couldn't due to iron deficiency anemia. She was able to complete her left knee replacement in mid-2020. Pt contracted the [place] virus in 2020. Pt received her COVID19 vaccines in May.   Pt uses Diclofenac topically for her left arm pain and left knee pain. She has had intermittent rashes that arise in the areas that she receives her insulin injections.   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   Of note prior to the patient's visit today, pt has had MM Breast ([id number]) completed on 02/28/2020 with results revealing "Bilateral axillary adenopathy."  Pt has had Left axilla lymph node biopsy ([id number]) completed on 03/30/2020 with results revealing "CHRONIC LYMPHOCYTIC LYMPHOMA/SMALL LYMPHOCYTIC LYMPHOMA".   Pt has had Left Axilla Flow Pathology Report [id number]) completed on 03/30/2020 with results revealing "Monoclonal B-cell population with coexpression of CD5 comprises 80% of all lymphocytes".  Most recent lab results (03/11/2020) of CBC is as follows: all values are WNL except for PLT at 99K.  On review of systems, pt reports rash, watery stools, abdominal pain and denies fevers,  chills, night sweats, unexpected weight loss and any other symptoms.   On PMHx the pt reports Type II Diabetes, DJD, HLD, HTN, Iron deficiency anemia, Arthritis, Total knee arthroplasty.  On Social Hx the pt reports that she is a non-smoker.  On Family Hx the pt reports that her mother was diagnosed with breast cancer at 28. She also had two maternal aunts who had breast cancer.   INTERVAL HISTORY:  I connected with  Janice [name] on 04/20/20 by telephone and verified that I am speaking with the correct person using two identifiers.   I discussed the limitations of evaluation and management by telemedicine. The patient expressed understanding and agreed to proceed.   Patient's location: Home Provider's location: [facility] [place] is a wonderful 69 y.o. 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 The patient's last visit with [place] was on 04/08/2020. The pt reports that she is doing well overall.  The pt reports no acute new symptoms.  Of note since the patient's last visit, pt has had FISH/CLL panel completed on 04/08/2020 with results revealing "1. No evidence of trisomy 12 (+12). 2. No evidence of D13S319 (13q14.3). 3. No evidence of p53 (17p13) deletion or amplification. 4. No evidence of ATM (11q22.3) deletion."  Lab results (04/08/20) of CBC w/diff and CMP is as follows: all values are WNL except for PLT at 93K, Lymphs Abs at 5.6K, Glucose at 106. 04/08/2020 IgG at 451 04/08/2020 LDH at 206 04/08/2020 Immature Platelet Fraction at 6.4    MEDICAL HISTORY:  Past Medical History:  Diagnosis Date  .  Allergy   . Asthma    seasonal  . Cellulitis 2013   Left toe  . Common migraine    History of  . Complication of anesthesia    Per pt/had breathing problems with "block" during rotator cuff surgery. 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  . DDD (degenerative disc disease)   . Depression    denies takes paxil for migraines  . Diabetes  mellitus    type 2  . Diabetic peripheral neuropathy ([place])   . Diverticulitis   . Diverticulosis   . DJD (degenerative joint disease)   . GERD (gastroesophageal reflux disease)   . History of colon polyps    hyperplastic  . History of gastric polyp   . Hyperlipidemia   . Hypertension   . Iron deficiency anemia   . Obesity   . OSA on CPAP    cpap  . Pneumonia    april 2020  mild  . Primary localized osteoarthritis of left knee 03/27/2019  . Sensorineural hearing loss     SURGICAL HISTORY: Past Surgical History:  Procedure Laterality Date  . ANKLE SURGERY     Left   . COLONOSCOPY  05/2018  . LEEP N/A 09/14/2018   Procedure: LOOP ELECTROSURGICAL EXCISION PROCEDURE (LEEP);  Surgeon: Arvella Nigh, MD;  Location: [facility];  Service: Gynecology;  Laterality: N/A;  . ROTATOR CUFF REPAIR Bilateral 2012, 2015  . TONSILLECTOMY    . TOTAL KNEE ARTHROPLASTY Left 04/08/2019   Procedure: TOTAL KNEE ARTHROPLASTY;  Surgeon: Elsie Saas, MD;  Location: [facility];  Service: Orthopedics;  Laterality: Left;  . UPPER GI ENDOSCOPY  05/2018    SOCIAL HISTORY: Social History   Socioeconomic History  . Marital status: Married    Spouse name: Not on file  . Number of children: 0  . Years of education: 3 years college  . Highest education level: Not on file  Occupational History  . Occupation: Unemployed  Tobacco Use  . Smoking status: Never Smoker  . Smokeless tobacco: Never Used  Vaping Use  . Vaping Use: Never used  Substance and Sexual Activity  . Alcohol use: Not Currently    Comment: very rare  . Drug use: No  . Sexual activity: Not Currently    Birth control/protection: None, Post-menopausal  Other Topics Concern  . Not on file  Social History Narrative   1 caffeine drink daily    Right-handed   Lives at home with husband.   Social Determinants of Health   Financial Resource Strain:   . Difficulty of Paying Living Expenses:   Food Insecurity:   .  Worried About Charity fundraiser in the Last Year:   . Arboriculturist in the Last Year:   Transportation Needs:   . Film/video editor (Medical):   Marland Kitchen Lack of Transportation (Non-Medical):   Physical Activity:   . Days of Exercise per Week:   . Minutes of Exercise per Session:   Stress:   . Feeling of Stress :   Social Connections:   . Frequency of Communication with Friends and Family:   . Frequency of Social Gatherings with Friends and Family:   . Attends Religious Services:   . Active Member of Clubs or Organizations:   . Attends Archivist Meetings:   Marland Kitchen Marital Status:   Intimate Partner Violence:   . Fear of Current or Ex-Partner:   . Emotionally Abused:   Marland Kitchen Physically Abused:   . Sexually Abused:  FAMILY HISTORY: Family History  Problem Relation Age of Onset  . Breast cancer Mother   . Bone cancer Mother   . Heart failure Father   . Colon cancer Neg Hx   . Rectal cancer Neg Hx   . Stomach cancer Neg Hx   . Esophageal cancer Neg Hx   . Pancreatic cancer Neg Hx   . Liver disease Neg Hx     ALLERGIES:  is allergic to benzonatate, fluticasone furoate-vilanterol, iodine, latex, simvastatin, bupropion, levofloxacin, penicillins, povidone-iodine, promethazine, amoxicillin, chlorthalidone, diflucan [fluconazole], and doxycycline.  MEDICATIONS:  Current Outpatient Medications  Medication Sig Dispense Refill  . acetaminophen (TYLENOL) 500 MG tablet Take 1,000 mg by mouth every 6 (six) hours as needed for moderate pain.     Marland Kitchen albuterol (PROVENTIL HFA;VENTOLIN HFA) 108 (90 Base) MCG/ACT inhaler Inhale 1-2 puffs into the lungs every 6 (six) hours as needed for wheezing or shortness of breath.    Marland Kitchen amLODipine (NORVASC) 10 MG tablet Take 1 tablet by mouth daily.    Marland Kitchen aspirin EC 81 MG tablet Take 1 tablet (81 mg total) by mouth daily. Swallow whole. 90 tablet 3  . Carboxymethylcellul-Glycerin (REFRESH OPTIVE PF OP) Apply 1 drop to eye 3 (three) times daily as  needed (dry eyes).    . cetirizine (ZYRTEC) 10 MG tablet Take 10 mg by mouth daily.    Marland Kitchen desoximetasone (TOPICORT) 0.25 % cream Apply 1 application topically 4 (four) times daily as needed (rash (applied to left ear daily)).     . diclofenac sodium (VOLTAREN) 1 % GEL Apply 2 g topically 2 (two) times daily as needed (knee pain).   2  . docusate sodium (COLACE) 100 MG capsule 1 tab 2 times a day while on narcotics.  STOOL SOFTENER 60 capsule 0  . Flaxseed, Linseed, OIL Take 1,000 mg by mouth daily.     . fluticasone (FLONASE) 50 MCG/ACT nasal spray Place 2 sprays into both nostrils daily.    Marland Kitchen gabapentin (NEURONTIN) 300 MG capsule Take 600 mg by mouth daily with supper.     . insulin NPH Human (HUMULIN N,NOVOLIN N) 100 UNIT/ML injection Inject 40-75 Units into the skin See admin instructions. Inject 75 units subcutaneously at breakfast, 40 units at lunch & 65 units at supper    . insulin regular (NOVOLIN R,HUMULIN R) 100 units/mL injection Inject 30-45 Units into the skin See admin instructions. Inject 30 units subcutaneously in the morning, 30 units at lunch & 30 units at supper    . irbesartan (AVAPRO) 300 MG tablet Take 1 tablet by mouth daily.    . metFORMIN (GLUCOPHAGE XR) 500 MG 24 hr tablet Take 1,000 mg by mouth 2 (two) times daily.     . Methylcobalamin (B12-ACTIVE PO) Take 0.5 tablets by mouth at bedtime.    . Multiple Minerals-Vitamins (CAL MAG ZINC +D3 PO) Take 2 tablets by mouth daily.    Marland Kitchen NAPROSYN 500 MG tablet Take 500 mg by mouth 2 (two) times daily.    Marland Kitchen omeprazole (PRILOSEC) 40 MG capsule Take 40 mg by mouth at bedtime.     Marland Kitchen oxyCODONE (OXY IR/ROXICODONE) 5 MG immediate release tablet Take 1-2 tablets (5-10 mg total) by mouth every 4 (four) hours as needed for moderate pain (pain score 4-6). 30 tablet 0  . PARoxetine (PAXIL) 20 MG tablet Take 10 mg by mouth daily.     . polyethylene glycol (MIRALAX / GLYCOLAX) 17 g packet 17grams in 6 oz of something to drink twice  a day until  bowel movement.  LAXITIVE.  Restart if two days since last bowel movement 14 each 0  . rosuvastatin (CRESTOR) 10 MG tablet Take 1 tablet (10 mg total) by mouth daily. 90 tablet 1  . SYMBICORT 160-4.5 MCG/ACT inhaler 2 puffs daily as needed.    Marland Kitchen terconazole (TERAZOL 3) 0.8 % vaginal cream Place 1 applicator vaginally as needed (yeast infections).     . vitamin C (ASCORBIC ACID) 500 MG tablet Take 500 mg by mouth 2 (two) times a day.      No current facility-administered medications for this visit.    REVIEW OF SYSTEMS:  .10 Point review of Systems was done is negative except as noted above.  PHYSICAL EXAMINATION: ECOG PERFORMANCE STATUS: 1 - Symptomatic but completely ambulatory  . Telehealth visit  LABORATORY DATA:  I have reviewed the data as listed  . CBC Latest Ref Rng & Units 04/08/2020 03/11/2020 04/09/2019  WBC 4.0 - 10.5 K/uL 10.3 9.7 16.6(H)  Hemoglobin 12.0 - 15.0 g/dL 12.4 12.8 10.6(L)  Hematocrit 36 - 46 % 39.8 40.1 35.3(L)  Platelets 150 - 400 K/uL 93(L) 99(LL) 79(L)   . CBC    Component Value Date/Time   WBC 10.3 04/08/2020 1435   RBC 4.55 04/08/2020 1435   HGB 12.4 04/08/2020 1435   HGB 12.8 03/11/2020 1315   HCT 39.8 04/08/2020 1435   HCT 40.1 03/11/2020 1315   PLT 93 (L) 04/08/2020 1435   PLT 99 (LL) 03/11/2020 1315   MCV 87.5 04/08/2020 1435   MCV 87 03/11/2020 1315   MCH 27.3 04/08/2020 1435   MCHC 31.2 04/08/2020 1435   RDW 14.6 04/08/2020 1435   RDW 14.0 03/11/2020 1315   LYMPHSABS 5.6 (H) 04/08/2020 1435   MONOABS 0.7 04/08/2020 1435   EOSABS 0.2 04/08/2020 1435   BASOSABS 0.1 04/08/2020 1435     . CMP Latest Ref Rng & Units 04/08/2020 04/09/2019 03/29/2019  Glucose 70 - 99 mg/dL 106(H) 320(H) 105(H)  BUN 8 - 23 mg/dL _0 Creatinine 0.44 - 1.00 mg/dL 0.83 0.90 0.68  Sodium 135 - 145 mmol/L 142 130(L) 136  Potassium 3.5 - 5.1 mmol/L 4.2 5.7(H) 4.2  Chloride 98 - 111 mmol/L 106 99 99  CO2 22 - 32 mmol/L _1 Calcium 8.9 - 10.3 mg/dL  10.3 8.5(L) 9.0  Total Protein 6.5 - 8.1 g/dL 6.8 - 7.1  Total Bilirubin 0.3 - 1.2 mg/dL 0.5 - 0.6  Alkaline Phos 38 - 126 U/L 97 - 92  AST 15 - 41 U/L 23 - 24  ALT 0 - 44 U/L 32 - 38   04/08/2020 FISH/CLL Panel:   03/30/2020 Left Axilla Flow Pathology Report [id number]):   03/30/2020 Left axilla lymph node Bx ([id number]):   RADIOGRAPHIC STUDIES: I have personally reviewed the radiological images as listed and agreed with the findings in the report. [place] AXILLARY NODE CORE BIOPSY LEFT  Addendum Date: 04/02/2020   ADDENDUM REPORT: 04/02/2020 09:16 ADDENDUM: Pathology revealed: CHRONIC LYMPHOCYTIC LYMPHOMA/SMALL LYMPHOCYTIC LYMPHOMA of the LEFT axillary lymph node. This was found to be concordant by Dr. Lovey Newcomer. Flow Cytometry revealed: a lambda-restricted B-cell population (80% of lymphocytes) with expression of CD5 and CD23 (See [id number]). Overall, the morphology and immunophenotype are consistent with chronic lymphocytic leukemia/small lymphocytic lymphoma. The patient reported doing well after the biopsy with tenderness at the site. Post biopsy instructions and care were reviewed and questions were answered. The patient was encouraged to call [facility] for any additional concerns. Pathology results were called to [place] for Dr. Arvella Nigh at [facility] who will notify patient of results and arrange referral to Hematologist/Oncologist. Pathology results reported by Stacie Acres RN on 04/02/2020. Electronically Signed   By: Lovey Newcomer M.D.   On: 04/02/2020 09:16   Result Date: 04/02/2020 CLINICAL DATA:  Patient with cortically thickened left axillary lymph node. EXAM: [place] AXILLARY NODE CORE BIOPSY LEFT COMPARISON:  Previous exam(s). PROCEDURE: I met with the patient and we discussed the procedure of ultrasound-guided biopsy, including benefits and alternatives. We discussed the high likelihood of a successful procedure. We  discussed the risks of the procedure, including infection, bleeding, tissue injury, clip migration, and inadequate sampling. Informed written consent was given. The usual time-out protocol was performed immediately prior to the procedure. Using sterile technique and 1% Lidocaine as local anesthetic, under direct ultrasound visualization, a 14 gauge spring-loaded device was used to perform biopsy of left axillary lymph node using a lateral approach. At the conclusion of the procedure [facility] tissue marker clip was deployed into the biopsy cavity. Follow up 2 view mammogram was performed and dictated separately. IMPRESSION: Ultrasound guided biopsy of left axillary lymph node. No apparent complications. Electronically Signed: By: Lovey Newcomer M.D. On: 03/30/2020 13:27    ASSESSMENT & PLAN:   69 yo with   1) Newly diagnosed CLL/SLL -02/28/2020 MM Breast ([id number]) revealed "Bilateral axillary adenopathy." -03/30/2020 Left axilla lymph node biopsy ([id number]) revealed "CHRONIC LYMPHOCYTIC LYMPHOMA/SMALL LYMPHOCYTIC LYMPHOMA".  -03/30/2020 Left Axilla Flow Pathology Report [id number]) revealed "Monoclonal B-cell population with coexpression of CD5 comprises 80% of all lymphocytes". 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 Avg risk CLL. -PET/CT pending -- ordered instead of CT since patient is allergic to CT IV contrast. - no overt indication for CLL treatment at this time. -Discussed pt receiving genetic counseling given her mother's history of breast cancer - pt unsure if she would like to proceed at this time.  -Will continue to monitor thrombocytopenia   FOLLOW UP: PET/CT as ordered in 1 week RTC with Dr Irene Limbo with labs in 4 months   The total time spent in the appt was 20 minutes and more than 50% was on counseling and direct patient cares.  All of the patient's questions were answered with apparent satisfaction. The patient knows to  call the clinic with any problems, questions or concerns.    Sullivan Lone MD [employer] AAHIVMS [facility] Hematology/Oncology Physician [facility]  (Office):       [phone] (Work cell):  [phone] (Fax):           [phone]  04/20/2020 7:31 AM  I, Yevette Edwards, am acting as a scribe for Dr. Sullivan Lone.   .I have reviewed the above documentation for accuracy and completeness, and I agree with the above. Brunetta Genera MD

## 2020-04-21 ENCOUNTER — Telehealth: Payer: Self-pay | Admitting: Hematology

## 2020-04-21 ENCOUNTER — Other Ambulatory Visit: Payer: Self-pay | Admitting: Hematology

## 2020-04-21 MED ORDER — LORAZEPAM 0.5 MG PO TABS
0.5000 mg | ORAL_TABLET | Freq: Once | ORAL | 0 refills | Status: DC | PRN
Start: 2020-04-21 — End: 2020-07-01

## 2020-04-21 NOTE — Telephone Encounter (Signed)
Scheduled per 08/09 los, patient has been called and notified of upcoming appointments.

## 2020-04-21 NOTE — Progress Notes (Unsigned)
ativan

## 2020-04-28 ENCOUNTER — Ambulatory Visit (HOSPITAL_COMMUNITY): Payer: Medicare HMO

## 2020-05-01 ENCOUNTER — Other Ambulatory Visit: Payer: Self-pay

## 2020-05-01 ENCOUNTER — Encounter (HOSPITAL_COMMUNITY)
Admission: RE | Admit: 2020-05-01 | Discharge: 2020-05-01 | Disposition: A | Discharge status: Home / Self Care | Payer: Medicare HMO | Source: Ambulatory Visit | Attending: Hematology | Admitting: Hematology

## 2020-05-01 ENCOUNTER — Ambulatory Visit (HOSPITAL_COMMUNITY): Payer: Medicare HMO

## 2020-05-01 DIAGNOSIS — K7469 Other cirrhosis of liver: Secondary | ICD-10-CM | POA: Diagnosis not present

## 2020-05-01 DIAGNOSIS — K7460 Unspecified cirrhosis of liver: Secondary | ICD-10-CM | POA: Insufficient documentation

## 2020-05-01 DIAGNOSIS — R590 Localized enlarged lymph nodes: Secondary | ICD-10-CM | POA: Diagnosis not present

## 2020-05-01 DIAGNOSIS — C9110 Chronic lymphocytic leukemia of B-cell type not having achieved remission: Secondary | ICD-10-CM | POA: Diagnosis not present

## 2020-05-01 DIAGNOSIS — C8300 Small cell B-cell lymphoma, unspecified site: Secondary | ICD-10-CM

## 2020-05-01 DIAGNOSIS — I2510 Atherosclerotic heart disease of native coronary artery without angina pectoris: Secondary | ICD-10-CM | POA: Insufficient documentation

## 2020-05-01 DIAGNOSIS — I700 Atherosclerosis of aorta: Secondary | ICD-10-CM | POA: Diagnosis not present

## 2020-05-01 DIAGNOSIS — R188 Other ascites: Secondary | ICD-10-CM | POA: Diagnosis not present

## 2020-05-01 LAB — GLUCOSE, CAPILLARY: Glucose-Capillary: 87 mg/dL (ref 70–99)

## 2020-05-01 MED ORDER — FLUDEOXYGLUCOSE F - 18 (FDG) INJECTION
11.5000 | Freq: Once | INTRAVENOUS | Status: AC | PRN
  Administered 2020-05-01: 11.5 via INTRAVENOUS

## 2020-05-11 ENCOUNTER — Telehealth: Payer: Self-pay | Admitting: Hematology

## 2020-05-11 ENCOUNTER — Encounter: Payer: Self-pay | Admitting: Hematology

## 2020-05-11 NOTE — Telephone Encounter (Signed)
PET/CT 05/01/2020 IMPRESSION: 1. Adenopathy within the neck, chest, abdomen, and pelvis, consistent with active lymphoma. Much of this is not significantly hypermetabolic. Some upper abdominal nodes are moderately hypermetabolic. ([place]) 4. 2. Advanced cirrhosis with small volume abdominopelvic ascites. 3. Mild limitations secondary to patient body habitus. 4. Coronary artery atherosclerosis. Aortic Atherosclerosis (ICD10-I70.0).   Electronically Signed   By: Abigail Miyamoto M.D.   On: 05/01/2020 11:30    -PET CT scan reviewed with the patient in details.  No bulky lymphadenopathy at this time or other overt solid organ involvement to suggest need for treatment of the patient's CLL/SLL at this time. -Incidentally noted advanced liver cirrhosis likely related to [place].  Patient has splenomegaly that is not FDG avid and is concerning to be related to her liver disease.  She has been recommended to connect with her primary care physician to further evaluate her concern for liver cirrhosis to optimize management for this and to consider referral to hepatology. -We shall follow-up in 4 months with labs as per previous plan.  Brunetta Genera

## 2020-05-13 DIAGNOSIS — E11621 Type 2 diabetes mellitus with foot ulcer: Secondary | ICD-10-CM | POA: Diagnosis not present

## 2020-05-13 DIAGNOSIS — E782 Mixed hyperlipidemia: Secondary | ICD-10-CM | POA: Diagnosis not present

## 2020-05-14 ENCOUNTER — Encounter: Payer: Self-pay | Admitting: Hematology

## 2020-05-20 DIAGNOSIS — E119 Type 2 diabetes mellitus without complications: Secondary | ICD-10-CM | POA: Diagnosis not present

## 2020-05-20 DIAGNOSIS — I10 Essential (primary) hypertension: Secondary | ICD-10-CM | POA: Diagnosis not present

## 2020-05-20 DIAGNOSIS — F329 Major depressive disorder, single episode, unspecified: Secondary | ICD-10-CM | POA: Diagnosis not present

## 2020-05-20 DIAGNOSIS — C8300 Small cell B-cell lymphoma, unspecified site: Secondary | ICD-10-CM | POA: Diagnosis not present

## 2020-05-20 DIAGNOSIS — G4733 Obstructive sleep apnea (adult) (pediatric): Secondary | ICD-10-CM | POA: Diagnosis not present

## 2020-05-20 DIAGNOSIS — E782 Mixed hyperlipidemia: Secondary | ICD-10-CM | POA: Diagnosis not present

## 2020-05-20 DIAGNOSIS — Z0000 Encounter for general adult medical examination without abnormal findings: Secondary | ICD-10-CM | POA: Diagnosis not present

## 2020-05-20 DIAGNOSIS — G629 Polyneuropathy, unspecified: Secondary | ICD-10-CM | POA: Diagnosis not present

## 2020-05-21 DIAGNOSIS — R82998 Other abnormal findings in urine: Secondary | ICD-10-CM | POA: Diagnosis not present

## 2020-05-21 DIAGNOSIS — Z1212 Encounter for screening for malignant neoplasm of rectum: Secondary | ICD-10-CM | POA: Diagnosis not present

## 2020-06-26 ENCOUNTER — Other Ambulatory Visit: Payer: Self-pay

## 2020-06-26 ENCOUNTER — Encounter (INDEPENDENT_AMBULATORY_CARE_PROVIDER_SITE_OTHER): Payer: Medicare HMO | Admitting: Ophthalmology

## 2020-06-26 DIAGNOSIS — H35033 Hypertensive retinopathy, bilateral: Secondary | ICD-10-CM | POA: Diagnosis not present

## 2020-06-26 DIAGNOSIS — I10 Essential (primary) hypertension: Secondary | ICD-10-CM

## 2020-06-26 DIAGNOSIS — H43813 Vitreous degeneration, bilateral: Secondary | ICD-10-CM | POA: Diagnosis not present

## 2020-06-26 DIAGNOSIS — E11319 Type 2 diabetes mellitus with unspecified diabetic retinopathy without macular edema: Secondary | ICD-10-CM | POA: Diagnosis not present

## 2020-06-26 DIAGNOSIS — E113293 Type 2 diabetes mellitus with mild nonproliferative diabetic retinopathy without macular edema, bilateral: Secondary | ICD-10-CM | POA: Diagnosis not present

## 2020-06-26 DIAGNOSIS — H2513 Age-related nuclear cataract, bilateral: Secondary | ICD-10-CM | POA: Diagnosis not present

## 2020-07-01 ENCOUNTER — Other Ambulatory Visit (INDEPENDENT_AMBULATORY_CARE_PROVIDER_SITE_OTHER): Payer: Medicare HMO

## 2020-07-01 ENCOUNTER — Encounter: Payer: Self-pay | Admitting: Physician Assistant

## 2020-07-01 ENCOUNTER — Ambulatory Visit: Payer: Medicare HMO | Admitting: Physician Assistant

## 2020-07-01 VITALS — BP 138/60 | HR 72 | Ht 64.0 in | Wt 230.0 lb

## 2020-07-01 DIAGNOSIS — R188 Other ascites: Secondary | ICD-10-CM

## 2020-07-01 DIAGNOSIS — D696 Thrombocytopenia, unspecified: Secondary | ICD-10-CM

## 2020-07-01 DIAGNOSIS — K7460 Unspecified cirrhosis of liver: Secondary | ICD-10-CM

## 2020-07-01 LAB — CBC WITH DIFFERENTIAL/PLATELET
Basophils Absolute: 0.1 10*3/uL (ref 0.0–0.1)
Basophils Relative: 0.7 % (ref 0.0–3.0)
Eosinophils Absolute: 0.1 10*3/uL (ref 0.0–0.7)
Eosinophils Relative: 1.2 % (ref 0.0–5.0)
HCT: 37.1 % (ref 36.0–46.0)
Hemoglobin: 12.1 g/dL (ref 12.0–15.0)
Lymphocytes Relative: 53.2 % — ABNORMAL HIGH (ref 12.0–46.0)
Lymphs Abs: 4.6 10*3/uL — ABNORMAL HIGH (ref 0.7–4.0)
MCHC: 32.6 g/dL (ref 30.0–36.0)
MCV: 82.5 fl (ref 78.0–100.0)
Monocytes Absolute: 0.5 10*3/uL (ref 0.1–1.0)
Monocytes Relative: 6.3 % (ref 3.0–12.0)
Neutro Abs: 3.4 10*3/uL (ref 1.4–7.7)
Neutrophils Relative %: 38.6 % — ABNORMAL LOW (ref 43.0–77.0)
Platelets: 80 10*3/uL — ABNORMAL LOW (ref 150.0–400.0)
RBC: 4.5 Mil/uL (ref 3.87–5.11)
RDW: 15.5 % (ref 11.5–15.5)
WBC: 8.7 10*3/uL (ref 4.0–10.5)

## 2020-07-01 LAB — COMPREHENSIVE METABOLIC PANEL
ALT: 24 U/L (ref 0–35)
AST: 18 U/L (ref 0–37)
Albumin: 4.3 g/dL (ref 3.5–5.2)
Alkaline Phosphatase: 79 U/L (ref 39–117)
BUN: 10 mg/dL (ref 6–23)
CO2: 29 mEq/L (ref 19–32)
Calcium: 8.9 mg/dL (ref 8.4–10.5)
Chloride: 102 mEq/L (ref 96–112)
Creatinine, Ser: 0.72 mg/dL (ref 0.40–1.20)
GFR: 85.22 mL/min (ref >60.00)
Glucose, Bld: 245 mg/dL — ABNORMAL HIGH (ref 70–99)
Potassium: 4.5 mEq/L (ref 3.5–5.1)
Sodium: 137 mEq/L (ref 135–145)
Total Bilirubin: 0.6 mg/dL (ref 0.2–1.2)
Total Protein: 6.1 g/dL (ref 6.0–8.3)

## 2020-07-01 LAB — PROTIME-INR
INR: 1.1 ratio — ABNORMAL HIGH (ref 0.8–1.0)
Prothrombin Time: 12.8 s (ref 9.6–13.1)

## 2020-07-01 LAB — IBC + FERRITIN
Ferritin: 14.5 ng/mL (ref 10.0–291.0)
Iron: 78 ug/dL (ref 42–145)
Saturation Ratios: 17.6 % — ABNORMAL LOW (ref 20.0–50.0)
Transferrin: 316 mg/dL (ref 212.0–360.0)

## 2020-07-01 NOTE — Patient Instructions (Signed)
Your provider has requested that you go to the basement level for lab work before leaving today. Press "B" on the elevator. The lab is located at the first door on the left as you exit the elevator.   Low-Sodium Eating Plan Sodium, which is an element that makes up salt, helps you maintain a healthy balance of fluids in your body. Too much sodium can increase your blood pressure and cause fluid and waste to be held in your body. Your health care provider or dietitian may recommend following this plan if you have high blood pressure (hypertension), kidney disease, liver disease, or heart failure. Eating less sodium can help lower your blood pressure, reduce swelling, and protect your heart, liver, and kidneys. What are tips for following this plan? General guidelines  Most people on this plan should limit their sodium intake to 1,500-2,000 mg (milligrams) of sodium each day. Reading food labels   The Nutrition Facts label lists the amount of sodium in one serving of the food. If you eat more than one serving, you must multiply the listed amount of sodium by the number of servings.  Choose foods with less than 140 mg of sodium per serving.  Avoid foods with 300 mg of sodium or more per serving. Shopping  Look for lower-sodium products, often labeled as "low-sodium" or "no salt added."  Always check the sodium content even if foods are labeled as "unsalted" or "no salt added".  Buy fresh foods. ? Avoid canned foods and premade or frozen meals. ? Avoid canned, cured, or processed meats  Buy breads that have less than 80 mg of sodium per slice. Cooking  Eat more home-cooked food and less restaurant, buffet, and fast food.  Avoid adding salt when cooking. Use salt-free seasonings or herbs instead of table salt or sea salt. Check with your health care provider or pharmacist before using salt substitutes.  Cook with plant-based oils, such as canola, sunflower, or olive oil. Meal  planning  When eating at a restaurant, ask that your food be prepared with less salt or no salt, if possible.  Avoid foods that contain MSG (monosodium glutamate). MSG is sometimes added to [place] food, bouillon, and some canned foods. What foods are recommended? The items listed may not be a complete list. Talk with your dietitian about what dietary choices are best for you. Grains Low-sodium cereals, including oats, puffed wheat and rice, and shredded wheat. Low-sodium crackers. Unsalted rice. Unsalted pasta. Low-sodium bread. Whole-grain breads and whole-grain pasta. Vegetables Fresh or frozen vegetables. "No salt added" canned vegetables. "No salt added" tomato sauce and paste. Low-sodium or reduced-sodium tomato and vegetable juice. Fruits Fresh, frozen, or canned fruit. Fruit juice. Meats and other protein foods Fresh or frozen (no salt added) meat, poultry, seafood, and fish. Low-sodium canned tuna and salmon. Unsalted nuts. Dried peas, beans, and lentils without added salt. Unsalted canned beans. Eggs. Unsalted nut butters. Dairy Milk. Soy milk. Cheese that is naturally low in sodium, such as ricotta cheese, fresh mozzarella, or Swiss cheese Low-sodium or reduced-sodium cheese. Cream cheese. Yogurt. Fats and oils Unsalted butter. Unsalted margarine with no trans fat. Vegetable oils such as canola or olive oils. Seasonings and other foods Fresh and dried herbs and spices. Salt-free seasonings. Low-sodium mustard and ketchup. Sodium-free salad dressing. Sodium-free light mayonnaise. Fresh or refrigerated horseradish. Lemon juice. Vinegar. Homemade, reduced-sodium, or low-sodium soups. Unsalted popcorn and pretzels. Low-salt or salt-free chips. What foods are not recommended? The items listed may not be a complete  list. Talk with your dietitian about what dietary choices are best for you. Grains Instant hot cereals. Bread stuffing, pancake, and biscuit mixes. Croutons. Seasoned rice or  pasta mixes. Noodle soup cups. Boxed or frozen macaroni and cheese. Regular salted crackers. Self-rising flour. Vegetables Sauerkraut, pickled vegetables, and relishes. Olives. [place] fries. Onion rings. Regular canned vegetables (not low-sodium or reduced-sodium). Regular canned tomato sauce and paste (not low-sodium or reduced-sodium). Regular tomato and vegetable juice (not low-sodium or reduced-sodium). Frozen vegetables in sauces. Meats and other protein foods Meat or fish that is salted, canned, smoked, spiced, or pickled. Bacon, ham, sausage, hotdogs, corned beef, chipped beef, packaged lunch meats, salt pork, jerky, pickled herring, anchovies, regular canned tuna, sardines, salted nuts. Dairy Processed cheese and cheese spreads. Cheese curds. Blue cheese. Feta cheese. String cheese. Regular cottage cheese. Buttermilk. Canned milk. Fats and oils Salted butter. Regular margarine. Ghee. Bacon fat. Seasonings and other foods Onion salt, garlic salt, seasoned salt, table salt, and sea salt. Canned and packaged gravies. Worcestershire sauce. Tartar sauce. Barbecue sauce. Teriyaki sauce. Soy sauce, including reduced-sodium. Steak sauce. Fish sauce. Oyster sauce. Cocktail sauce. Horseradish that you find on the shelf. Regular ketchup and mustard. Meat flavorings and tenderizers. Bouillon cubes. Hot sauce and Tabasco sauce. Premade or packaged marinades. Premade or packaged taco seasonings. Relishes. Regular salad dressings. Salsa. Potato and tortilla chips. Corn chips and puffs. Salted popcorn and pretzels. Canned or dried soups. Pizza. Frozen entrees and pot pies. Summary  Eating less sodium can help lower your blood pressure, reduce swelling, and protect your heart, liver, and kidneys.  Most people on this plan should limit their sodium intake to 1,500-2,000 mg (milligrams) of sodium each day.  Canned, boxed, and frozen foods are high in sodium. Restaurant foods, fast foods, and pizza are also  very high in sodium. You also get sodium by adding salt to food.  Try to cook at home, eat more fresh fruits and vegetables, and eat less fast food, canned, processed, or prepared foods. This information is not intended to replace advice given to you by your health care provider. Make sure you discuss any questions you have with your health care provider. Document Revised: 08/11/2017 Document Reviewed: 08/22/2016 Elsevier Patient Education  [employer].   Cirrhosis  Cirrhosis is long-term (chronic) liver injury. The liver is the body's largest internal organ, and it performs many functions. It converts food into energy, removes toxic material from the blood, makes important proteins, and absorbs necessary vitamins from food. In cirrhosis, healthy liver cells are replaced by scar tissue. This prevents blood from flowing through the liver, making it difficult for the liver to function. Scarring of the liver cannot be reversed, but treatment can prevent it from getting worse. What are the causes? Common causes of this condition are hepatitis C and long-term alcohol abuse. Other causes include:  Nonalcoholic fatty liver disease. This happens when fat is deposited in the liver by causes other than alcohol.  Hepatitis B infection.  Autoimmune hepatitis. In this condition, the body's defense system (immune system) mistakenly attacks the liver cells, causing irritation and swelling (inflammation).  Diseases that cause blockage of ducts inside the liver.  Inherited liver diseases, such as hemochromatosis. This is one of the most common inherited liver diseases. In this disease, deposits of iron collect in the liver and other organs.  Reactions to certain long-term medicines, such as amiodarone, a heart medicine.  Parasitic infections. These include schistosomiasis, which is caused by a flatworm.  Long-term  contact to certain toxins. These toxins include certain organic solvents, such as  toluene and chloroform. What increases the risk? You are more likely to develop this condition if:  You have certain types of viral hepatitis.  You abuse alcohol, especially if you are female.  You are overweight.  You share needles.  You have unprotected sex with someone who has viral hepatitis. What are the signs or symptoms? You may not have any signs and symptoms at first. Symptoms may not develop until the damage to your liver starts to get worse. Early symptoms may include:  Weakness and tiredness (fatigue).  Changes in sleep patterns or having trouble sleeping.  Itchiness.  Tenderness in the right-upper part of your abdomen.  Weight loss and muscle loss.  Nausea.  Loss of appetite.  Appearance of tiny blood vessels under the skin. Later symptoms may include:  Fatigue or weakness that is getting worse.  Yellow skin and eyes (jaundice).  Buildup of fluid in the abdomen (ascites). You may notice that your clothes are tight around your waist.  Weight gain.  Swelling of the feet and ankles (edema).  Trouble breathing.  Easy bruising and bleeding.  Vomiting blood.  Black or bloody stool.  Mental confusion. How is this diagnosed? Your health care provider may suspect cirrhosis based on your symptoms and medical history, especially if you have other medical conditions or a history of alcohol abuse. Your health care provider will do a physical exam to feel your liver and to check for signs of cirrhosis. He or she may perform other tests, including:  Blood tests to check: ? For hepatitis B or C. ? Kidney function. ? Liver function.  Imaging tests such as: ? MRI or CT scan to look for changes seen in advanced cirrhosis. ? Ultrasound to see if normal liver tissue is being replaced by scar tissue.  A procedure in which a long needle is used to take a sample of liver tissue to be checked in a lab (biopsy). Liver biopsy can confirm the diagnosis of  cirrhosis. How is this treated? Treatment for this condition depends on how damaged your liver is and what caused the damage. It may include treating the symptoms of cirrhosis, or treating the underlying causes in order to slow the damage. Treatment may include:  Making lifestyle changes, such as: ? Eating a healthy diet. You may need to work with your health care provider or a diet and nutrition specialist (dietitian) to develop an eating plan. ? Restricting salt intake. ? Maintaining a healthy weight. ? Not abusing drugs or alcohol.  Taking medicines to: ? Treat liver infections or other infections. ? Control itching. ? Reduce fluid buildup. ? Reduce certain blood toxins. ? Reduce risk of bleeding from enlarged blood vessels in the stomach or esophagus (varices).  Liver transplant. In this procedure, a liver from a donor is used to replace your diseased liver. This is done if cirrhosis has caused liver failure. Other treatments and procedures may be done depending on the problems that you get from cirrhosis. Common problems include liver-related kidney failure (hepatorenal syndrome). Follow these instructions at home:   Take medicines only as told by your health care provider. Do not use medicines that are toxic to your liver. Ask your health care provider before taking any new medicines, including over-the-counter medicines.  Rest as needed.  Eat a well-balanced diet. Ask your health care provider or dietitian for more information.  Limit your salt or water intake, if your  health care provider asks you to do this.  Do not drink alcohol. This is especially important if you are taking acetaminophen.  Keep all follow-up visits as told by your health care provider. This is important. Contact a health care provider if you:  Have fatigue or weakness that is getting worse.  Develop swelling of the hands, feet, legs, or face.  Have a fever.  Develop loss of appetite.  Have  nausea or vomiting.  Develop jaundice.  Develop easy bruising or bleeding. Get help right away if you:  Vomit bright red blood or a material that looks like coffee grounds.  Have blood in your stools.  Notice that your stools appear black and tarry.  Become confused.  Have chest pain or trouble breathing. Summary  Cirrhosis is chronic liver injury. Liver damage cannot be reversed. Common causes are hepatitis C and long-term alcohol abuse.  Tests used to diagnose cirrhosis include blood tests, imaging tests, and liver biopsy.  Treatment for this condition involves treating the underlying cause. Avoid alcohol, drugs, salt, and medicines that may damage your liver.  Contact your health care provider if you develop ascites, edema, jaundice, fever, nausea or vomiting, easy bruising or bleeding, or worsening fatigue. This information is not intended to replace advice given to you by your health care provider. Make sure you discuss any questions you have with your health care provider. Document Revised: 12/19/2018 Document Reviewed: 07/19/2017 Elsevier Patient Education  [employer].   Due to recent changes in healthcare laws, you may see the results of your imaging and laboratory studies on MyChart before your provider has had a chance to review them.  We understand that in some cases there may be results that are confusing or concerning to you. Not all laboratory results come back in the same time frame and the provider may be waiting for multiple results in order to interpret others.  Please give [place] 48 hours in order for your provider to thoroughly review all the results before contacting the office for clarification of your results.   I appreciate the  opportunity to care for you  Thank You   Lovett Calender

## 2020-07-01 NOTE — Addendum Note (Signed)
Addended by: Trenda Moots on: 02/30/1720 10:39 AM   Modules accepted: Orders

## 2020-07-01 NOTE — Progress Notes (Signed)
Chief Complaint: Cirrhosis  HPI:    Janice [name] is a 69 year old Caucasian female with a past medical history as listed below, known to Dr. Ardis Hughs, who was referred to me by Prince Solian, MD for a complaint of cirrhosis.      06/07/2018 EGD and colonoscopy.  Repeat colonoscopy recommended in 5 years.    04/08/2020 CBC with platelets minimally decreased at 93 (these appeared to have been low over the past year).    05/01/2020 PET scan for initial treatment strategy for newly diagnosed chronic lymphocytic leukemia/small lymphocytic lymphoma.  This showed advanced cirrhosis.  Small volume perihepatic and perisplenic ascites, splenomegaly and small volume cul-de-sac fluid.    Today, the patient presents to clinic and explains that she was completely unaware of cirrhosis until recent PET scan.  Apparently does recall being diagnosed with fatty liver "years ago" on an ultrasound (which is not in our system).  Tells me that she has never been a heavy alcohol drinker, has no family history of liver disease, no history of blood transfusions, IV drug use or tattoos.  Does explain that her husband has cirrhosis which is advanced, in fact they are discussing TIPS procedure.  So, she is aware of this disease.    Denies fever, chills, excessive weight gain, abdominal distention or leg swelling.     Past Medical History:  Diagnosis Date  . Allergy   . Asthma    seasonal  . Cellulitis 2013   Left toe  . Common migraine    History of  . Complication of anesthesia    Per pt/had breathing problems with "block" during rotator cuff surgery. 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  . DDD (degenerative disc disease)   . Depression    denies takes paxil for migraines  . Diabetes mellitus    type 2  . Diabetic peripheral neuropathy ([place])   . Diverticulitis   . Diverticulosis   . DJD (degenerative joint disease)   . GERD (gastroesophageal reflux disease)   . History of colon polyps    hyperplastic  .  History of gastric polyp   . Hyperlipidemia   . Hypertension   . Iron deficiency anemia   . Obesity   . OSA on CPAP    cpap  . Pneumonia    april 2020  mild  . Primary localized osteoarthritis of left knee 03/27/2019  . Sensorineural hearing loss     Past Surgical History:  Procedure Laterality Date  . ANKLE SURGERY     Left   . COLONOSCOPY  05/2018  . LEEP N/A 09/14/2018   Procedure: LOOP ELECTROSURGICAL EXCISION PROCEDURE (LEEP);  Surgeon: Arvella Nigh, MD;  Location: [facility];  Service: Gynecology;  Laterality: N/A;  . ROTATOR CUFF REPAIR Bilateral 2012, 2015  . TONSILLECTOMY    . TOTAL KNEE ARTHROPLASTY Left 04/08/2019   Procedure: TOTAL KNEE ARTHROPLASTY;  Surgeon: Elsie Saas, MD;  Location: [facility];  Service: Orthopedics;  Laterality: Left;  . UPPER GI ENDOSCOPY  05/2018    Current Outpatient Medications  Medication Sig Dispense Refill  . albuterol (PROVENTIL HFA;VENTOLIN HFA) 108 (90 Base) MCG/ACT inhaler Inhale 1-2 puffs into the lungs every 6 (six) hours as needed for wheezing or shortness of breath.    Marland Kitchen amLODipine (NORVASC) 10 MG tablet Take 1 tablet by mouth daily.    Marland Kitchen aspirin EC 81 MG tablet Take 1 tablet (81 mg total) by mouth daily. Swallow whole. 90 tablet 3  . Carboxymethylcellul-Glycerin (REFRESH OPTIVE  PF OP) Apply 1 drop to eye 3 (three) times daily as needed (dry eyes).    . cetirizine (ZYRTEC) 10 MG tablet Take 10 mg by mouth daily.    Marland Kitchen desoximetasone (TOPICORT) 0.25 % cream Apply 1 application topically 4 (four) times daily as needed (rash (applied to left ear daily)).     . diclofenac sodium (VOLTAREN) 1 % GEL Apply 2 g topically 2 (two) times daily as needed (knee pain).   2  . Flaxseed, Linseed, OIL Take 1,000 mg by mouth daily.     . fluticasone (FLONASE) 50 MCG/ACT nasal spray Place 2 sprays into both nostrils daily.    Marland Kitchen gabapentin (NEURONTIN) 300 MG capsule Take 600 mg by mouth daily with supper.     . insulin NPH Human (HUMULIN  N,NOVOLIN N) 100 UNIT/ML injection Inject 40-75 Units into the skin See admin instructions. Inject 75 units subcutaneously at breakfast, 40 units at lunch & 65 units at supper    . insulin regular (NOVOLIN R,HUMULIN R) 100 units/mL injection Inject 30-45 Units into the skin See admin instructions. Inject 30 units subcutaneously in the morning, 30 units at lunch & 30 units at supper    . irbesartan (AVAPRO) 300 MG tablet Take 1 tablet by mouth daily.    . metFORMIN (GLUCOPHAGE XR) 500 MG 24 hr tablet Take 1,000 mg by mouth 2 (two) times daily.     . Methylcobalamin (B12-ACTIVE PO) Take 0.5 tablets by mouth at bedtime.    . Multiple Minerals-Vitamins (CAL MAG ZINC +D3 PO) Take 2 tablets by mouth daily.    Marland Kitchen NAPROSYN 500 MG tablet Take 500 mg by mouth 2 (two) times daily.    Marland Kitchen omeprazole (PRILOSEC) 40 MG capsule Take 40 mg by mouth at bedtime.     Marland Kitchen PARoxetine (PAXIL) 20 MG tablet Take 10 mg by mouth daily.     . rosuvastatin (CRESTOR) 10 MG tablet Take 1 tablet (10 mg total) by mouth daily. 90 tablet 1  . SYMBICORT 160-4.5 MCG/ACT inhaler 2 puffs daily as needed.    Marland Kitchen terconazole (TERAZOL 3) 0.8 % vaginal cream Place 1 applicator vaginally as needed (yeast infections).     . vitamin C (ASCORBIC ACID) 500 MG tablet Take 500 mg by mouth 2 (two) times a day.      No current facility-administered medications for this visit.    Allergies as of 07/01/2020 - Review Complete 07/01/2020  Allergen Reaction Noted  . Benzonatate Anaphylaxis and Hives 12/06/2016  . Fluticasone furoate-vilanterol Hives and Anaphylaxis 02/01/2019  . Iodine Hives, Swelling, and Other (See Comments) 07/19/2016  . Latex Hives, Itching, Rash, and Other (See Comments) 09/28/2013  . Simvastatin Other (See Comments) 01/12/2013  . Bupropion Other (See Comments) 01/12/2013  . Levofloxacin Other (See Comments) 12/30/2013  . Penicillins Hives 01/27/2020  . Povidone-iodine Hives 06/30/2016  . Promethazine Nausea Only and Other (See  Comments) 01/12/2013  . Amoxicillin Hives 08/27/2018  . Chlorthalidone Other (See Comments) 03/09/2020  . Diflucan [fluconazole] Hives and Other (See Comments) 08/27/2018  . Doxycycline Itching and Diarrhea 12/30/2013    Family History  Problem Relation Age of Onset  . Breast cancer Mother   . Bone cancer Mother   . Heart failure Father   . Colon cancer Neg Hx   . Rectal cancer Neg Hx   . Stomach cancer Neg Hx   . Esophageal cancer Neg Hx   . Pancreatic cancer Neg Hx   . Liver disease Neg Hx  Social History   Socioeconomic History  . Marital status: Married    Spouse name: Not on file  . Number of children: 0  . Years of education: 3 years college  . Highest education level: Not on file  Occupational History  . Occupation: Unemployed  Tobacco Use  . Smoking status: Never Smoker  . Smokeless tobacco: Never Used  Vaping Use  . Vaping Use: Never used  Substance and Sexual Activity  . Alcohol use: Not Currently    Comment: very rare  . Drug use: No  . Sexual activity: Not Currently    Birth control/protection: None, Post-menopausal  Other Topics Concern  . Not on file  Social History Narrative   1 caffeine drink daily    Right-handed   Lives at home with husband.   Social Determinants of Health   Financial Resource Strain:   . Difficulty of Paying Living Expenses: Not on file  Food Insecurity:   . Worried About Charity fundraiser in the Last Year: Not on file  . Ran Out of Food in the Last Year: Not on file  Transportation Needs:   . Lack of Transportation (Medical): Not on file  . Lack of Transportation (Non-Medical): Not on file  Physical Activity:   . Days of Exercise per Week: Not on file  . Minutes of Exercise per Session: Not on file  Stress:   . Feeling of Stress : Not on file  Social Connections:   . Frequency of Communication with Friends and Family: Not on file  . Frequency of Social Gatherings with Friends and Family: Not on file  . Attends  Religious Services: Not on file  . Active Member of Clubs or Organizations: Not on file  . Attends Archivist Meetings: Not on file  . Marital Status: Not on file  Intimate Partner Violence:   . Fear of Current or Ex-Partner: Not on file  . Emotionally Abused: Not on file  . Physically Abused: Not on file  . Sexually Abused: Not on file    Review of Systems:    Constitutional: No weight loss, fever or chills Cardiovascular: No chest pain Respiratory: No SOB  Gastrointestinal: See HPI and otherwise negative   Physical Exam:  Vital signs: BP 138/60   Pulse 72   Ht 5\' 4"  (1.626 m)   Wt 230 lb (104.3 kg)   BMI 39.48 kg/m   Constitutional:   Pleasant overweight Caucasian female appears to be in NAD, Well developed, Well nourished, alert and cooperative Head:  Normocephalic and atraumatic. Eyes:   PEERL, EOMI. No icterus. Conjunctiva pink. Ears:  Normal auditory acuity. Neck:  Supple Throat: Oral cavity and pharynx without inflammation, swelling or lesion.  Respiratory: Respirations even and unlabored. Lungs clear to auscultation bilaterally.   No wheezes, crackles, or rhonchi.  Cardiovascular: Normal S1, S2. No MRG. Regular rate and rhythm. No peripheral edema, cyanosis or pallor.  Gastrointestinal:  Soft, mild distention, nontender. No rebound or guarding. Normal bowel sounds. No appreciable masses or hepatomegaly. Rectal:  Not performed.  Msk:  Symmetrical without gross deformities. Without edema, no deformity or joint abnormality.  Neurologic:  Alert and  oriented x4;  grossly normal neurologically.  Skin:   Dry and intact without significant lesions or rashes. Psychiatric:  Demonstrates good judgement and reason without abnormal affect or behaviors.  RELEVANT LABS AND IMAGING: CBC    Component Value Date/Time   WBC 10.3 04/08/2020 1435   RBC 4.55 04/08/2020 1435  HGB 12.4 04/08/2020 1435   HGB 12.8 03/11/2020 1315   HCT 39.8 04/08/2020 1435   HCT 40.1  03/11/2020 1315   PLT 93 (L) 04/08/2020 1435   PLT 99 (LL) 03/11/2020 1315   MCV 87.5 04/08/2020 1435   MCV 87 03/11/2020 1315   MCH 27.3 04/08/2020 1435   MCHC 31.2 04/08/2020 1435   RDW 14.6 04/08/2020 1435   RDW 14.0 03/11/2020 1315   LYMPHSABS 5.6 (H) 04/08/2020 1435   MONOABS 0.7 04/08/2020 1435   EOSABS 0.2 04/08/2020 1435   BASOSABS 0.1 04/08/2020 1435    CMP     Component Value Date/Time   NA 142 04/08/2020 1435   K 4.2 04/08/2020 1435   CL 106 04/08/2020 1435   CO2 27 04/08/2020 1435   GLUCOSE 106 (H) 04/08/2020 1435   BUN 9 04/08/2020 1435   CREATININE 0.83 04/08/2020 1435   CALCIUM 10.3 04/08/2020 1435   PROT 6.8 04/08/2020 1435   ALBUMIN 4.4 04/08/2020 1435   AST 23 04/08/2020 1435   ALT 32 04/08/2020 1435   ALKPHOS 97 04/08/2020 1435   BILITOT 0.5 04/08/2020 1435   GFRNONAA >60 04/08/2020 1435   GFRAA >60 04/08/2020 1435    Assessment: 1.  Cirrhosis with ascites: Seen on recent PET scan, vague history of fatty liver, most likely this is advancement of that process, but will order further labs today to consider autoimmune causes versus other 2.  Thrombocytopenia: Likely due to above  Plan: 1.  We will proceed with further liver labs today.  CBC, CMP, PT/INR, iron studies, AFP, ANA, AMA, ASMA, alpha 1 antitrypsin, hepatitis studies 2.  Discussed with the patient we need to do imaging every 6 months for [facility] screening.  She has just had a PET scan so this will not be due until February of next year. 3.  Discussed a low-sodium diet, less than 2 g a day.  Provided handout. 4.  Discussed the various sequelae of cirrhosis and the disease process itself.  Provided her with a handout. 5.  Would like to start the patient on a diuretic given ascites seen at time of PET scan, she tells me that she was started on one by her cardiologist but cannot remember the name and it made her have ringing in her ears.  Told her to call back and let us know so we can avoid that  diuretic.  Also explained that we will need to repeat a CMP 2 weeks after she starts diuretic to ensure that this is working well with her body. 6.  Did discuss the patient is due for an EGD as we do these every 2 years for variceal screening.  Her last was in September 2019 for IDA, she would be due now, but wants to wait on this until May of next year. 7.  Patient request to switch to Dr. Carlean Purl, will communicate with both Dr. Ardis Hughs and Dr. Carlean Purl to ensure that this is acceptable to them.  If it is acceptable then would recommend the patient have a follow-up office visit with her doctor in about 2 months to discuss this new diagnosis of cirrhosis further ans see how she is doing on diuretic.  Ellouise Newer, PA-C [facility] 07/01/2020, 9:57 AM  Cc: Prince Solian, MD

## 2020-07-02 ENCOUNTER — Telehealth: Payer: Self-pay | Admitting: Physician Assistant

## 2020-07-02 NOTE — Telephone Encounter (Signed)
Left message for pt to call back  °

## 2020-07-03 NOTE — Telephone Encounter (Signed)
Janice [name] called yesterday because while she had her office visit she could not remember the name of the medication that made her ears ring back in May. She was advised to call back with the information and I apologize that my message did not provide this information.

## 2020-07-03 NOTE — Telephone Encounter (Signed)
Anderson Malta- FYI.Marland KitchenMarland Kitchen Im guessing you and patient had a conversation about medications that caused some tinnitus for patient in the past? If so, they are chlorthalidone and possibly furosemide per patient. She wanted to make you aware but does not require any response from you.

## 2020-07-05 LAB — HEPATITIS A ANTIBODY, TOTAL: Hepatitis A AB,Total: NONREACTIVE

## 2020-07-05 LAB — AFP TUMOR MARKER: AFP-Tumor Marker: 4.1 ng/mL

## 2020-07-05 LAB — ALPHA-1-ANTITRYPSIN: A-1 Antitrypsin, Ser: 176 mg/dL (ref 83–199)

## 2020-07-05 LAB — HEPATITIS B SURFACE ANTIBODY,QUALITATIVE: Hep B S Ab: NONREACTIVE

## 2020-07-05 LAB — CERULOPLASMIN: Ceruloplasmin: 28 mg/dL (ref 18–53)

## 2020-07-05 LAB — TISSUE TRANSGLUTAMINASE ABS,IGG,IGA
(tTG) Ab, IgA: <1 U/mL
(tTG) Ab, IgG: <1 U/mL

## 2020-07-05 LAB — MITOCHONDRIAL ANTIBODIES: Mitochondrial M2 Ab, IgG: <=20 U

## 2020-07-05 LAB — HEPATITIS B SURFACE ANTIGEN: Hepatitis B Surface Ag: NONREACTIVE

## 2020-07-05 LAB — ANA: Anti Nuclear Antibody (ANA): NEGATIVE

## 2020-07-05 LAB — ANTI-SMOOTH MUSCLE ANTIBODY, IGG: Actin (Smooth Muscle) Antibody (IGG): <20 U (ref <20)

## 2020-07-05 LAB — HEPATITIS C ANTIBODY
Hepatitis C Ab: NONREACTIVE
SIGNAL TO CUT-OFF: 0 (ref <1.00)

## 2020-07-05 LAB — IGA: Immunoglobulin A: 31 mg/dL — ABNORMAL LOW (ref 70–320)

## 2020-07-08 ENCOUNTER — Other Ambulatory Visit: Payer: Self-pay

## 2020-07-08 DIAGNOSIS — K7460 Unspecified cirrhosis of liver: Secondary | ICD-10-CM

## 2020-07-08 DIAGNOSIS — R188 Other ascites: Secondary | ICD-10-CM

## 2020-07-08 MED ORDER — SPIRONOLACTONE 50 MG PO TABS: 50.0000 mg | ORAL_TABLET | Freq: Every day | ORAL | 6 refills | Status: DC

## 2020-07-08 NOTE — Telephone Encounter (Signed)
I have contacted patient to advise that since she has had issues in the past with chlorthalidone and furosemide causing tinnitus, we can send spironolactone. Patient clarifies that she has actually only had issues in the past with chlorthalidone causing tinnitus. She was previously explaining to the phone coordinator that her husband has had issues with chlorthalidone in the past but has taken furosemide in its place and wondered if she should take furosemide. Again, she HAS NOT had any issues with furosemide. HOWEVER, another nurse has already sent prescription for spironolactone and patient has picked up prescription. Therefore,  I have asked patient to come for BMP on 07/23/20. Orders are in [place]. Patient verbalizes understanding of this information.

## 2020-07-08 NOTE — Telephone Encounter (Signed)
See phone note

## 2020-07-08 NOTE — Telephone Encounter (Signed)
I just found the message, it looks like furosemide possibly cause ringing in the ears.  Let us start her on spironolactone 50 mg daily and recheck a BMP in 2 weeks.  Thanks, JLL.  Please make sure that furosemide is listed in her allergies with tinnitus.

## 2020-07-21 ENCOUNTER — Other Ambulatory Visit (INDEPENDENT_AMBULATORY_CARE_PROVIDER_SITE_OTHER): Payer: Medicare HMO

## 2020-07-21 DIAGNOSIS — K7460 Unspecified cirrhosis of liver: Secondary | ICD-10-CM

## 2020-07-21 DIAGNOSIS — Z779 Other contact with and (suspected) exposures hazardous to health: Secondary | ICD-10-CM | POA: Diagnosis not present

## 2020-07-21 DIAGNOSIS — R188 Other ascites: Secondary | ICD-10-CM

## 2020-07-21 DIAGNOSIS — D649 Anemia, unspecified: Secondary | ICD-10-CM | POA: Diagnosis not present

## 2020-07-21 DIAGNOSIS — N958 Other specified menopausal and perimenopausal disorders: Secondary | ICD-10-CM | POA: Diagnosis not present

## 2020-07-21 DIAGNOSIS — N9089 Other specified noninflammatory disorders of vulva and perineum: Secondary | ICD-10-CM | POA: Diagnosis not present

## 2020-07-21 LAB — CBC WITH DIFFERENTIAL/PLATELET
Basophils Absolute: 0 10*3/uL (ref 0.0–0.1)
Basophils Relative: 0.4 % (ref 0.0–3.0)
Eosinophils Absolute: 0.1 10*3/uL (ref 0.0–0.7)
Eosinophils Relative: 1.1 % (ref 0.0–5.0)
HCT: 37 % (ref 36.0–46.0)
Hemoglobin: 12.1 g/dL (ref 12.0–15.0)
Lymphocytes Relative: 48.2 % — ABNORMAL HIGH (ref 12.0–46.0)
Lymphs Abs: 5.9 10*3/uL — ABNORMAL HIGH (ref 0.7–4.0)
MCHC: 32.7 g/dL (ref 30.0–36.0)
MCV: 81.9 fl (ref 78.0–100.0)
Monocytes Absolute: 0.7 10*3/uL (ref 0.1–1.0)
Monocytes Relative: 5.9 % (ref 3.0–12.0)
Neutro Abs: 5.4 10*3/uL (ref 1.4–7.7)
Neutrophils Relative %: 44.4 % (ref 43.0–77.0)
Platelets: 89 10*3/uL — ABNORMAL LOW (ref 150.0–400.0)
RBC: 4.52 Mil/uL (ref 3.87–5.11)
RDW: 16 % — ABNORMAL HIGH (ref 11.5–15.5)
WBC: 12.2 10*3/uL — ABNORMAL HIGH (ref 4.0–10.5)

## 2020-07-21 LAB — IBC + FERRITIN
Ferritin: 15.6 ng/mL (ref 10.0–291.0)
Iron: 77 ug/dL (ref 42–145)
Saturation Ratios: 16.6 % — ABNORMAL LOW (ref 20.0–50.0)
Transferrin: 332 mg/dL (ref 212.0–360.0)

## 2020-07-21 LAB — BASIC METABOLIC PANEL
BUN: 12 mg/dL (ref 6–23)
CO2: 26 mEq/L (ref 19–32)
Calcium: 9.1 mg/dL (ref 8.4–10.5)
Chloride: 103 mEq/L (ref 96–112)
Creatinine, Ser: 0.89 mg/dL (ref 0.40–1.20)
GFR: 66.15 mL/min (ref >60.00)
Glucose, Bld: 91 mg/dL (ref 70–99)
Potassium: 4.4 mEq/L (ref 3.5–5.1)
Sodium: 138 mEq/L (ref 135–145)

## 2020-07-22 ENCOUNTER — Other Ambulatory Visit: Payer: Self-pay

## 2020-07-22 DIAGNOSIS — R188 Other ascites: Secondary | ICD-10-CM

## 2020-07-22 DIAGNOSIS — K760 Fatty (change of) liver, not elsewhere classified: Secondary | ICD-10-CM | POA: Diagnosis not present

## 2020-07-22 DIAGNOSIS — E119 Type 2 diabetes mellitus without complications: Secondary | ICD-10-CM | POA: Diagnosis not present

## 2020-07-22 DIAGNOSIS — K7460 Unspecified cirrhosis of liver: Secondary | ICD-10-CM | POA: Diagnosis not present

## 2020-07-22 DIAGNOSIS — I10 Essential (primary) hypertension: Secondary | ICD-10-CM | POA: Diagnosis not present

## 2020-07-24 ENCOUNTER — Ambulatory Visit: Payer: Medicare HMO | Admitting: Internal Medicine

## 2020-07-27 ENCOUNTER — Encounter: Payer: Self-pay | Admitting: Hematology

## 2020-08-03 ENCOUNTER — Other Ambulatory Visit (INDEPENDENT_AMBULATORY_CARE_PROVIDER_SITE_OTHER): Payer: Medicare HMO

## 2020-08-03 DIAGNOSIS — K7460 Unspecified cirrhosis of liver: Secondary | ICD-10-CM | POA: Diagnosis not present

## 2020-08-03 DIAGNOSIS — R188 Other ascites: Secondary | ICD-10-CM | POA: Diagnosis not present

## 2020-08-03 LAB — CBC WITH DIFFERENTIAL/PLATELET
Basophils Absolute: 0.1 10*3/uL (ref 0.0–0.1)
Basophils Relative: 0.6 % (ref 0.0–3.0)
Eosinophils Absolute: 0.2 10*3/uL (ref 0.0–0.7)
Eosinophils Relative: 1.7 % (ref 0.0–5.0)
HCT: 37.7 % (ref 36.0–46.0)
Hemoglobin: 12.5 g/dL (ref 12.0–15.0)
Lymphocytes Relative: 49.5 % — ABNORMAL HIGH (ref 12.0–46.0)
Lymphs Abs: 5 10*3/uL — ABNORMAL HIGH (ref 0.7–4.0)
MCHC: 33.1 g/dL (ref 30.0–36.0)
MCV: 82.3 fl (ref 78.0–100.0)
Monocytes Absolute: 0.6 10*3/uL (ref 0.1–1.0)
Monocytes Relative: 5.5 % (ref 3.0–12.0)
Neutro Abs: 4.4 10*3/uL (ref 1.4–7.7)
Neutrophils Relative %: 42.7 % — ABNORMAL LOW (ref 43.0–77.0)
Platelets: 87 10*3/uL — ABNORMAL LOW (ref 150.0–400.0)
RBC: 4.58 Mil/uL (ref 3.87–5.11)
RDW: 15.9 % — ABNORMAL HIGH (ref 11.5–15.5)
WBC: 10.2 10*3/uL (ref 4.0–10.5)

## 2020-08-19 NOTE — Progress Notes (Signed)
HEMATOLOGY/ONCOLOGY CLINIC NOTE  Date of Service: 08/20/2020  Patient Care Team: Prince Solian, MD as PCP - General (Internal Medicine)  CHIEF COMPLAINTS/PURPOSE OF CONSULTATION:  Newly diagnosed CLL/SLL  HISTORY OF PRESENTING ILLNESS:   Janice [name] is a wonderful 69 y.o. female who has been referred to [place] by Dr. 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 The pt reports that she is doing well overall.   The pt reports that her last mammogram was a part of routine screening. She had no constitutional symptoms prior and while she has felt differently over the last year she believes this was due to her other medical concerns. Pt reports sharp, intermittent abdominal pain.   Pt was supposed to have a knee replacement in 2019, but couldn't due to iron deficiency anemia. She was able to complete her left knee replacement in mid-2020. Pt contracted the [place] virus in 2020. Pt received her COVID19 vaccines in May.   Pt uses Diclofenac topically for her left arm pain and left knee pain. She has had intermittent rashes that arise in the areas that she receives her insulin injections.   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   Of note prior to the patient's visit today, pt has had MM Breast ([id number]) completed on 02/28/2020 with results revealing "Bilateral axillary adenopathy."  Pt has had Left axilla lymph node biopsy ([id number]) completed on 03/30/2020 with results revealing "CHRONIC LYMPHOCYTIC LYMPHOMA/SMALL LYMPHOCYTIC LYMPHOMA".   Pt has had Left Axilla Flow Pathology Report [id number]) completed on 03/30/2020 with results revealing "Monoclonal B-cell population with coexpression of CD5 comprises 80% of all lymphocytes".  Most recent lab results (03/11/2020) of CBC is as follows: all values are WNL except for PLT at 99K.  On review of systems, pt reports rash, watery stools, abdominal pain and denies fevers,  chills, night sweats, unexpected weight loss and any other symptoms.   On PMHx the pt reports Type II Diabetes, DJD, HLD, HTN, Iron deficiency anemia, Arthritis, Total knee arthroplasty.  On Social Hx the pt reports that she is a non-smoker.  On Family Hx the pt reports that her mother was diagnosed with breast cancer at 18. She also had two maternal aunts who had breast cancer.   INTERVAL HISTORY: Janice [name] is a wonderful 69 y.o. 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 The patient's last visit with [place] was on 04/20/2020. The pt reports that she is doing well overall.  The pt reports that she followed up with Gastroenterrology who confirmed her diagnosis of liver cirrhosis. Pt was placed on a diuretic and will see them again later today. Pt is currently taking OTC oral iron. She endorses occasional shooting pain in her abdomen.   Lab results today (08/20/20) of CBC w/diff and CMP is as follows: all values are WNL except for Hgb at 11.8, PLT at 84K, Lymphs Abs at 5.3K, Glucose at 195. 08/20/2020 LDH at 230  On review of systems, pt reports abdominal pain, headache and denies new lumps/bumps, constipation and any other symptoms.    MEDICAL HISTORY:  Past Medical History:  Diagnosis Date  . Allergy   . Asthma    seasonal  . Cellulitis 2013   Left toe  . Cirrhosis (Ages)   . Common migraine    History of  . Complication of anesthesia    Per pt/had breathing problems with "block" during rotator cuff surgery. Memory loss after rotator cuff  surgery  . DDD (degenerative disc disease)   . Depression    denies takes paxil for migraines  . Diabetes mellitus    type 2  . Diabetic peripheral neuropathy ([place])   . Diverticulitis   . Diverticulosis   . DJD (degenerative joint disease)   . GERD (gastroesophageal reflux disease)   . History of colon polyps    hyperplastic  . History of gastric polyp   . Hyperlipidemia   . Hypertension   . Iron deficiency anemia   .  Obesity   . OSA on CPAP    cpap  . Pneumonia    april 2020  mild  . Primary localized osteoarthritis of left knee 03/27/2019  . Sensorineural hearing loss     SURGICAL HISTORY: Past Surgical History:  Procedure Laterality Date  . ANKLE SURGERY     Left   . COLONOSCOPY  05/2018  . LEEP N/A 09/14/2018   Procedure: LOOP ELECTROSURGICAL EXCISION PROCEDURE (LEEP);  Surgeon: Arvella Nigh, MD;  [facility];  Service: Gynecology;  Laterality: N/A;  . ROTATOR CUFF REPAIR Bilateral 2012, 2015  . TONSILLECTOMY    . TOTAL KNEE ARTHROPLASTY Left 04/08/2019   Procedure: TOTAL KNEE ARTHROPLASTY;  Surgeon: Elsie Saas, MD;  Location: [facility];  Service: Orthopedics;  Laterality: Left;  . UPPER GI ENDOSCOPY  05/2018    SOCIAL HISTORY: Social History   Socioeconomic History  . Marital status: Married    Spouse name: Not on file  . Number of children: 0  . Years of education: 3 years college  . Highest education level: Not on file  Occupational History  . Occupation: Unemployed  Tobacco Use  . Smoking status: Never Smoker  . Smokeless tobacco: Never Used  Vaping Use  . Vaping Use: Never used  Substance and Sexual Activity  . Alcohol use: Not Currently    Comment: very rare  . Drug use: No  . Sexual activity: Not Currently    Birth control/protection: None, Post-menopausal  Other Topics Concern  . Not on file  Social History Narrative   1 caffeine drink daily    Right-handed   Lives at home with husband.   Social Determinants of Health   Financial Resource Strain: Not on file  Food Insecurity: Not on file  Transportation Needs: Not on file  Physical Activity: Not on file  Stress: Not on file  Social Connections: Not on file  Intimate Partner Violence: Not on file    FAMILY HISTORY: Family History  Problem Relation Age of Onset  . Breast cancer Mother   . Bone cancer Mother   . Heart failure Father   . Colon cancer Neg Hx   . Rectal cancer Neg  Hx   . Stomach cancer Neg Hx   . Esophageal cancer Neg Hx   . Pancreatic cancer Neg Hx   . Liver disease Neg Hx     ALLERGIES:  is allergic to benzonatate, fluticasone furoate-vilanterol, iodine, latex, simvastatin, bupropion, levofloxacin, penicillins, povidone-iodine, promethazine, amoxicillin, chlorthalidone, diflucan [fluconazole], and doxycycline.  MEDICATIONS:  Current Outpatient Medications  Medication Sig Dispense Refill  . albuterol (PROVENTIL HFA;VENTOLIN HFA) 108 (90 Base) MCG/ACT inhaler Inhale 1-2 puffs into the lungs every 6 (six) hours as needed for wheezing or shortness of breath.    Marland Kitchen amLODipine (NORVASC) 10 MG tablet Take 1 tablet by mouth daily.    Marland Kitchen aspirin EC 81 MG tablet Take 1 tablet (81 mg total) by mouth daily. Swallow whole. 90 tablet  3  . Carboxymethylcellul-Glycerin (REFRESH OPTIVE PF OP) Apply 1 drop to eye 3 (three) times daily as needed (dry eyes).    . cetirizine (ZYRTEC) 10 MG tablet Take 10 mg by mouth daily.    Marland Kitchen desoximetasone (TOPICORT) 0.25 % cream Apply 1 application topically 4 (four) times daily as needed (rash (applied to left ear daily)).     . diclofenac sodium (VOLTAREN) 1 % GEL Apply 2 g topically 2 (two) times daily as needed (knee pain).   2  . Flaxseed, Linseed, OIL Take 1,000 mg by mouth daily.     . fluticasone (FLONASE) 50 MCG/ACT nasal spray Place 2 sprays into both nostrils daily.    Marland Kitchen gabapentin (NEURONTIN) 300 MG capsule Take 600 mg by mouth daily with supper.     . insulin NPH Human (HUMULIN N,NOVOLIN N) 100 UNIT/ML injection Inject 40-75 Units into the skin See admin instructions. Inject 75 units subcutaneously at breakfast, 40 units at lunch & 65 units at supper    . insulin regular (NOVOLIN R,HUMULIN R) 100 units/mL injection Inject 30-45 Units into the skin See admin instructions. Inject 30 units subcutaneously in the morning, 30 units at lunch & 30 units at supper    . irbesartan (AVAPRO) 300 MG tablet Take 1 tablet by mouth daily.     . metFORMIN (GLUCOPHAGE-XR) 500 MG 24 hr tablet Take 1,000 mg by mouth 2 (two) times daily.    . Methylcobalamin (B12-ACTIVE PO) Take 0.5 tablets by mouth at bedtime.    . Multiple Minerals-Vitamins (CAL MAG ZINC +D3 PO) Take 2 tablets by mouth daily.    Marland Kitchen NAPROSYN 500 MG tablet Take 500 mg by mouth 2 (two) times daily.    Marland Kitchen omeprazole (PRILOSEC) 40 MG capsule Take 40 mg by mouth at bedtime.     Marland Kitchen PARoxetine (PAXIL) 20 MG tablet Take 10 mg by mouth daily.     . rosuvastatin (CRESTOR) 10 MG tablet Take 1 tablet (10 mg total) by mouth daily. 90 tablet 1  . terconazole (TERAZOL 3) 0.8 % vaginal cream Place 1 applicator vaginally as needed (yeast infections).     . vitamin C (ASCORBIC ACID) 500 MG tablet Take 500 mg by mouth 2 (two) times a day.     Marland Kitchen FLUZONE HIGH-DOSE QUADRIVALENT 0.7 ML SUSY     . spironolactone (ALDACTONE) 50 MG tablet Take 1 tablet (50 mg total) by mouth daily. 30 tablet 6  . SYMBICORT 160-4.5 MCG/ACT inhaler 2 puffs daily as needed. (Patient not taking: Reported on 08/20/2020)     No current facility-administered medications for this visit.    REVIEW OF SYSTEMS:   A 10+ POINT REVIEW OF SYSTEMS WAS OBTAINED including neurology, dermatology, psychiatry, cardiac, respiratory, lymph, extremities, GI, GU, Musculoskeletal, constitutional, breasts, reproductive, HEENT.  All pertinent positives are noted in the HPI.  All others are negative.   PHYSICAL EXAMINATION: ECOG PERFORMANCE STATUS: 1 - Symptomatic but completely ambulatory   GENERAL:alert, in no acute distress and comfortable SKIN: no acute rashes, no significant lesions EYES: conjunctiva are pink and non-injected, sclera anicteric OROPHARYNX: MMM, no exudates, no oropharyngeal erythema or ulceration NECK: supple, no JVD LYMPH:  no palpable lymphadenopathy in the cervical or inguinal regions. Small left axillary lymph node, LUNGS: clear to auscultation b/l with normal respiratory effort HEART: regular rate &  rhythm ABDOMEN:  normoactive bowel sounds , non tender, not distended. No palpable hepatosplenomegaly.  Extremity: no pedal edema PSYCH: alert & oriented x 3 with fluent speech NEURO:  no focal motor/sensory deficits  LABORATORY DATA:  I have reviewed the data as listed  . CBC Latest Ref Rng & Units 08/20/2020 08/03/2020 07/21/2020  WBC 4.0 - 10.5 K/uL 9.9 10.2 12.2(H)  Hemoglobin 12.0 - 15.0 g/dL 11.8(L) 12.5 12.1  Hematocrit 36.0 - 46.0 % 37.3 37.7 37.0  Platelets 150 - 400 K/uL 84(L) 87.0(L) 89.0(L)   . CBC    Component Value Date/Time   WBC 9.9 08/20/2020 0844   RBC 4.32 08/20/2020 0844   HGB 11.8 (L) 08/20/2020 0844   HGB 12.8 03/11/2020 1315   HCT 37.3 08/20/2020 0844   HCT 40.1 03/11/2020 1315   PLT 84 (L) 08/20/2020 0844   PLT 99 (LL) 03/11/2020 1315   MCV 86.3 08/20/2020 0844   MCV 87 03/11/2020 1315   MCH 27.3 08/20/2020 0844   MCHC 31.6 08/20/2020 0844   RDW 15.5 08/20/2020 0844   RDW 14.0 03/11/2020 1315   LYMPHSABS 5.3 (H) 08/20/2020 0844   MONOABS 0.7 08/20/2020 0844   EOSABS 0.2 08/20/2020 0844   BASOSABS 0.1 08/20/2020 0844     . CMP Latest Ref Rng & Units 08/20/2020 07/21/2020 07/01/2020  Glucose 70 - 99 mg/dL 195(H) 91 245(H)  BUN 8 - 23 mg/dL 13 12 10   Creatinine 0.44 - 1.00 mg/dL 0.83 0.89 0.72  Sodium 135 - 145 mmol/L 140 138 137  Potassium 3.5 - 5.1 mmol/L 4.9 4.4 4.5  Chloride 98 - 111 mmol/L 105 103 102  CO2 22 - 32 mmol/L 25 26 29   Calcium 8.9 - 10.3 mg/dL 9.4 9.1 8.9  Total Protein 6.5 - 8.1 g/dL 6.5 - 6.1  Total Bilirubin 0.3 - 1.2 mg/dL 0.6 - 0.6  Alkaline Phos 38 - 126 U/L 73 - 79  AST 15 - 41 U/L 20 - 18  ALT 0 - 44 U/L 25 - 24   04/08/2020 FISH/CLL Panel:   03/30/2020 Left Axilla Flow Pathology Report [id number]):   03/30/2020 Left axilla lymph node Bx ([id number]):   RADIOGRAPHIC STUDIES: I have personally reviewed the radiological images as listed and agreed with the findings in the report. No results  found.  ASSESSMENT & PLAN:   69 yo with   1) Newly diagnosed CLL/SLL -02/28/2020 MM Breast ([id number]) revealed "Bilateral axillary adenopathy." -03/30/2020 Left axilla lymph node biopsy ([id number]) revealed "CHRONIC LYMPHOCYTIC LYMPHOMA/SMALL LYMPHOCYTIC LYMPHOMA".  -03/30/2020 Left Axilla Flow Pathology Report [id number]) revealed "Monoclonal B-cell population with coexpression of CD5 comprises 80% of all lymphocytes". -04/20/20 PET/CT revealed 1. Adenopathy within the neck, chest, abdomen, and pelvis, consistent with active lymphoma. Much of this is not significantly hypermetabolic. Some upper abdominal nodes are moderately hypermetabolic. 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 -Thrombocytopenia is likely related to advanced liver cirrhosis, not CLL. Will continue to monitor.  -No lab or clinical evidence of CLL progression requiring treatment at this time. Will continue watchful observation. -Advised pt that de-stressing, maintaining ideal bodyweight, maintaining high activity levels, and eating a well-balanced diet is good for overall health.  -Discussed CDC guidelines regarding the COVID19 booster. Recommend she receive it soon. -Recommend pt continue f/u with Dr. Gatha Mayer for liver cirrhosis management. -Will see back in 4 months with labs    FOLLOW UP: RTC with Dr Irene Limbo with labs in 4 months  The total time spent in the appt was 20 minutes and more than 50% was on counseling and direct patient cares.  All of the patient's questions were answered with apparent satisfaction. The patient knows to call the clinic with any problems, questions or concerns.    Sullivan Lone MD [place] AAHIVMS [facility] Hematology/Oncology Physician [facility]  (Office):       [phone] (Work cell):  [phone] (Fax):           [phone]  08/20/2020 10:33 AM  I, Yevette Edwards, am acting as a scribe for Dr. Sullivan Lone.   .I have reviewed the above documentation for accuracy and completeness, and I agree with the above. Brunetta Genera MD

## 2020-08-20 ENCOUNTER — Telehealth: Payer: Self-pay | Admitting: Hematology

## 2020-08-20 ENCOUNTER — Encounter: Payer: Self-pay | Admitting: Internal Medicine

## 2020-08-20 ENCOUNTER — Inpatient Hospital Stay: Payer: Medicare HMO | Admitting: Hematology

## 2020-08-20 ENCOUNTER — Inpatient Hospital Stay: Payer: Medicare HMO | Attending: Hematology

## 2020-08-20 ENCOUNTER — Other Ambulatory Visit: Payer: Self-pay

## 2020-08-20 ENCOUNTER — Ambulatory Visit: Payer: Medicare HMO | Admitting: Internal Medicine

## 2020-08-20 ENCOUNTER — Encounter: Payer: Self-pay | Admitting: General Practice

## 2020-08-20 VITALS — BP 130/60 | HR 71 | Ht 64.5 in | Wt 223.0 lb

## 2020-08-20 VITALS — BP 118/89 | HR 70 | Temp 98.2°F | Resp 18 | Ht 64.0 in | Wt 226.1 lb

## 2020-08-20 DIAGNOSIS — Z6837 Body mass index (BMI) 37.0-37.9, adult: Secondary | ICD-10-CM

## 2020-08-20 DIAGNOSIS — Z79899 Other long term (current) drug therapy: Secondary | ICD-10-CM | POA: Insufficient documentation

## 2020-08-20 DIAGNOSIS — Z96652 Presence of left artificial knee joint: Secondary | ICD-10-CM | POA: Insufficient documentation

## 2020-08-20 DIAGNOSIS — K7581 Nonalcoholic steatohepatitis (NASH): Secondary | ICD-10-CM

## 2020-08-20 DIAGNOSIS — E611 Iron deficiency: Secondary | ICD-10-CM

## 2020-08-20 DIAGNOSIS — R1012 Left upper quadrant pain: Secondary | ICD-10-CM

## 2020-08-20 DIAGNOSIS — R109 Unspecified abdominal pain: Secondary | ICD-10-CM | POA: Diagnosis not present

## 2020-08-20 DIAGNOSIS — I10 Essential (primary) hypertension: Secondary | ICD-10-CM | POA: Insufficient documentation

## 2020-08-20 DIAGNOSIS — C8300 Small cell B-cell lymphoma, unspecified site: Secondary | ICD-10-CM

## 2020-08-20 DIAGNOSIS — R188 Other ascites: Secondary | ICD-10-CM | POA: Diagnosis not present

## 2020-08-20 DIAGNOSIS — E118 Type 2 diabetes mellitus with unspecified complications: Secondary | ICD-10-CM | POA: Insufficient documentation

## 2020-08-20 DIAGNOSIS — D696 Thrombocytopenia, unspecified: Secondary | ICD-10-CM

## 2020-08-20 DIAGNOSIS — K7460 Unspecified cirrhosis of liver: Secondary | ICD-10-CM

## 2020-08-20 DIAGNOSIS — Z803 Family history of malignant neoplasm of breast: Secondary | ICD-10-CM | POA: Insufficient documentation

## 2020-08-20 DIAGNOSIS — M1990 Unspecified osteoarthritis, unspecified site: Secondary | ICD-10-CM | POA: Diagnosis not present

## 2020-08-20 DIAGNOSIS — E8881 Metabolic syndrome: Secondary | ICD-10-CM

## 2020-08-20 DIAGNOSIS — D509 Iron deficiency anemia, unspecified: Secondary | ICD-10-CM | POA: Diagnosis not present

## 2020-08-20 DIAGNOSIS — C9110 Chronic lymphocytic leukemia of B-cell type not having achieved remission: Secondary | ICD-10-CM | POA: Insufficient documentation

## 2020-08-20 DIAGNOSIS — E785 Hyperlipidemia, unspecified: Secondary | ICD-10-CM | POA: Insufficient documentation

## 2020-08-20 DIAGNOSIS — E669 Obesity, unspecified: Secondary | ICD-10-CM

## 2020-08-20 LAB — CBC WITH DIFFERENTIAL/PLATELET
Abs Immature Granulocytes: 0.03 10*3/uL (ref 0.00–0.07)
Basophils Absolute: 0.1 10*3/uL (ref 0.0–0.1)
Basophils Relative: 1 %
Eosinophils Absolute: 0.2 10*3/uL (ref 0.0–0.5)
Eosinophils Relative: 2 %
HCT: 37.3 % (ref 36.0–46.0)
Hemoglobin: 11.8 g/dL — ABNORMAL LOW (ref 12.0–15.0)
Immature Granulocytes: 0 %
Lymphocytes Relative: 53 %
Lymphs Abs: 5.3 10*3/uL — ABNORMAL HIGH (ref 0.7–4.0)
MCH: 27.3 pg (ref 26.0–34.0)
MCHC: 31.6 g/dL (ref 30.0–36.0)
MCV: 86.3 fL (ref 80.0–100.0)
Monocytes Absolute: 0.7 10*3/uL (ref 0.1–1.0)
Monocytes Relative: 7 %
Neutro Abs: 3.7 10*3/uL (ref 1.7–7.7)
Neutrophils Relative %: 37 %
Platelets: 84 10*3/uL — ABNORMAL LOW (ref 150–400)
RBC: 4.32 MIL/uL (ref 3.87–5.11)
RDW: 15.5 % (ref 11.5–15.5)
WBC: 9.9 10*3/uL (ref 4.0–10.5)
nRBC: 0 % (ref 0.0–0.2)

## 2020-08-20 LAB — CMP (CANCER CENTER ONLY)
ALT: 25 U/L (ref 0–44)
AST: 20 U/L (ref 15–41)
Albumin: 4.1 g/dL (ref 3.5–5.0)
Alkaline Phosphatase: 73 U/L (ref 38–126)
Anion gap: 10 (ref 5–15)
BUN: 13 mg/dL (ref 8–23)
CO2: 25 mmol/L (ref 22–32)
Calcium: 9.4 mg/dL (ref 8.9–10.3)
Chloride: 105 mmol/L (ref 98–111)
Creatinine: 0.83 mg/dL (ref 0.44–1.00)
GFR, Estimated: >60 mL/min (ref >60)
Glucose, Bld: 195 mg/dL — ABNORMAL HIGH (ref 70–99)
Potassium: 4.9 mmol/L (ref 3.5–5.1)
Sodium: 140 mmol/L (ref 135–145)
Total Bilirubin: 0.6 mg/dL (ref 0.3–1.2)
Total Protein: 6.5 g/dL (ref 6.5–8.1)

## 2020-08-20 LAB — LACTATE DEHYDROGENASE: LDH: 230 U/L — ABNORMAL HIGH (ref 98–192)

## 2020-08-20 MED ORDER — HEPATITIS A-HEP B RECOMB VAC 720-20 ELU-MCG/ML IM SUSY: 1.0000 mL | PREFILLED_SYRINGE | Freq: Once | INTRAMUSCULAR | 2 refills | Status: AC

## 2020-08-20 NOTE — Telephone Encounter (Signed)
Release: [id number] Faxed medical records to [facility] @ fax # [phone]

## 2020-08-20 NOTE — Progress Notes (Signed)
[facility] Clinical Social Work  Initial Assessment   Janice [name] is a 69 y.o. year old female contacted by phone.  Clinical Social Work was referred by Distress Screen for assessment of psychosocial needs. Diagnosed w CLL/Non Hodgkins Lymphoma 4 months ago - "at this point I dont need to have any therapy, if it stays the same, then would not need to be seen often."  Cirrhosis is "big", and "needs to be taken care of."  Concerned about making decisions re husband's care - does not have information   Husband is currently sick with cirrhosis, diagnosed in 2019.  Has major impact on husband, "major fatigue, he is basically in bed sleeping, I give him his medicines."  Has been recently discharged from hospital, had high ammonia levels.  Was referred to [facility] for PT at hospital discharge - family has not heard from [facility] agency to date.    Patient is primary caregiver for husband and is also managing all household affairs/decision making.  She does have support from church and friends, and they are both in the process of learning more about the specifics of their present and future health care needs.  She wants to get more information from husband;s medical team before proceeding further.  Discussed options for gathering more information and resources, including Radiographer, therapeutic. Also advised her to contact [place] to determine if he/she might be eligible for any form of [place] and/or [employer].  She will do this.    SDOH (Social Determinants of Health) assessments performed: Yes SDOH Interventions   Flowsheet Row Most Recent Value  SDOH Interventions   Food Insecurity Interventions Intervention Not Indicated  Financial Strain Interventions --  [husbands medical expenses are concerning to patient]  Housing Interventions Intervention Not Indicated  Social Connections Interventions Intervention Not Indicated  Transportation Interventions Intervention Not Indicated       Distress Screen completed: Yes ONCBCN DISTRESS SCREENING 08/20/2020  Screening Type Initial Screening  Distress experienced in past week (1-10) 8  Practical problem type (No Data)  Family Problem type Partner  Information Concerns Type Lack of info about diagnosis  Referral to clinical social work Yes   Family/Social Information:  . Housing Arrangement: patient lives with husband . Family members/support persons in your life? Church, sister, good friend in [place] . Transportation concerns: she currently drives to/from appointments . Employment: Retired. Patient herself works part time.   . Income source: retirement income, work income . Financial concerns: Yes, due to illness and/or loss of work during treatment o Type of concern: Medical bills, Care giving (Child care or elder care services) and how to transition from current home to another living situation that might provide more support . Food access concerns: None . Religious or spiritual practice: regular church attender and supported by church family . Medication Concerns: none at this time . Services Currently in place:  Husband was recently discharged from hospital, were told he was eligible for "free home care" post discharge under palliative care.  [facility] has spoken w patient about her husbands care needs.  PT was supposed to be set up post husband's discharge.   Coping/ Adjustment to diagnosis: . Patient understands treatment plan and what happens next? Newly diagnosed with CLL/Lymphoma, being observed to determine whether she needs treatment.  Also diagnosed w cirrhosis, treatment for this is unclear at this time.  Major stressor is husband's medical condition - she is his primary caregiver at this time.  Is unclear about  his prognosis/needs, will have important appointments w his medical team in the coming weeks. . Concerns about diagnosis and/or treatment: How I will care for other members of my family and How I  will pay for the services I need . Patient reported stressors: Housing, Finances and Adjusting to my illness . Patient enjoys time with family/ friends and church activities . Current coping skills/ strengths: Supportive family/friends   SUMMARY: Current SDOH Barriers:  . Financial constraints related to medical costs, limited income, caregiving stress  Interventions: . Discussed common feeling and emotions when being diagnosed with cancer, and the importance of support during treatment . Informed patient of the support team roles and support services at [facility] . Provided CSW contact information and encouraged patient to call with any questions or concerns . Referred patient to [employer] for additional resources, advised patient to contact husbands PCP as home health ordered during recent hospitalization has not contacted family to date.   Follow Up Plan: Patient will contact CSW with any support or resource needs Patient verbalizes understanding of plan: Yes    Beverely Pace , LCSW

## 2020-08-20 NOTE — Patient Instructions (Addendum)
Please look up the following Web site: [employer]  We will put you into our system for a May 2022 EGD recall.  We are providing you with a rx to take to the pharmacy to get your Twin-rix vaccine.  I appreciate the opportunity to care for you. Silvano Rusk, MD, [facility]

## 2020-08-20 NOTE — Progress Notes (Signed)
Janice [name] 69 y.o. 24-Jan-1951 [id number]  Assessment & Plan:   Encounter Diagnoses  Name Primary?  . Liver cirrhosis secondary to NASH (nonalcoholic steatohepatitis) ([place]) Yes  . Other ascites - mild   . LUQ pain   . Thrombocytopenia ([place])   . Iron deficiency   . Abdominal obesity and metabolic syndrome   . Class 2 severe obesity with serious comorbidity and body mass index (BMI) of 37.0 to 37.9 in adult, unspecified obesity type (HCC)    Liver cirrhosis secondary to NASH (nonalcoholic steatohepatitis) ([place]) I explained that we have no particular treatment for her cirrhosis other than managing potential complications or existing complications and I think weight loss is her most important thing she could try to accomplish though that is difficult given that she is an insulin-dependent type 2 diabetic.  However its not impossible.  Unfortunately she has a lot of stress with her husband who is ill.  She should have a repeat EGD within the next year given that she has some ascites technically she is slightly decompensated though she is a child's class a 6 points with a good prognosis for now.  She cannot afford to schedule an EGD between time and money at this point so we will plan for recall in the spring to reassess.  I would anticipate seeing her about twice a year routinely.  Continue low-sodium diet as recommended by Ellouise Newer, PA-C  She does not drink and was told to avoid alcohol going forward.  Needs repeat cross-sectional imaging to screen for hepatocellular carcinoma in August 2022.  I have recommended she look at the American liver foundation website for further information about cirrhosis.  I have suggested she look at the diet doctor website and to consider a lower carbohydrate diet and it sounds like what she is doing with primary care to try to stop eating when her blood sugar is low normal or a little low is smart.  Given that she is on insulin intermittent  fasting though not impossible is clearly tricky and would require coordination with the physician that can do that.  I do not think she has the ability to participate in a program like that though I did mention there was one at [facility] with Dr. Enrigue Catena.  Not sure why she has left upper quadrant pain right now but splenomegaly may have something to do with that.  EGD would help sort that out more but she is unwilling to schedule right now.  I have given her a prescription for hepatitis a and B vaccine to take to her pharmacy where it will be cheaper for her to have that.  I have recommended she proceed with the Covid booster.   I appreciate the opportunity to care for this patient. CC: Avva, Ravisankar, MD    Subjective:   Chief Complaint: Cirrhosis  HPI 69 year old white woman with recent diagnosis of cirrhosis in the setting of obesity metabolic syndrome and etiology is thought to be nonalcoholic steatohepatitis.  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  There was small amount of ascites.  She saw Ellouise Newer after this on July 01, 2020.  Note reviewed.  HPI as below  06/07/2018 EGD and colonoscopy.  Repeat colonoscopy recommended in 5 years.    04/08/2020 CBC with platelets minimally decreased at 93 (these appeared to have been low over the past year).  05/01/2020 PET scan for initial treatment strategy for newly diagnosed chronic lymphocytic leukemia/small lymphocytic lymphoma.  This showed advanced cirrhosis.  Small volume perihepatic and perisplenic ascites, splenomegaly and small volume cul-de-sac fluid.    Today, the patient presents to clinic and explains that she was completely unaware of cirrhosis until recent PET scan.  Apparently does recall being diagnosed with fatty liver "years ago" on an ultrasound (which is not in our system).  Tells me that she has never been a heavy  alcohol drinker, has no family history of liver disease, no history of blood transfusions, IV drug use or tattoos.  Does explain that her husband has cirrhosis which is advanced, in fact they are discussing TIPS procedure.  So, she is aware of this disease.    Denies fever, chills, excessive weight gain, abdominal distention or leg swelling.  She feels about the same, tolerating the spironolactone.  She does have intermittent left upper quadrant discomfort that comes and goes not related to eating movement or meals.  Had labs through oncology with electrolytes and renal function normal.  Glucose 195.  This is on date of this visit.  Ferritin was low after seeing Anderson Malta at 14.5 and iron supplementation was recommended.  Her entire serologic work-up which is in the chart was negative for autoimmune or acquired liver disease.  Platelet count 84 today hemoglobin 11.8 white count 9.9.  When we tested her she was nave to hepatitis a and B.  We reviewed her situation and I answered many questions today.  Unfortunately her husband is continuing to do poorly with his cirrhosis though his hepatic encephalopathy is improved.  He has had to have frequent paracenteses and they are considering a TIPS.  The patient herself is not a drinker.  She said she is being retrained to stop feeding her insulin meaning to not over treat lower blood glucoses by eating too much in an effort to try to curb her weight gain.  This is through primary care at Dr. Danna Hefty office.  She says she has lost weight in the past with diets but she tends to [facility].  She has never been able to consistently lose weight and keep it off. Allergies  Allergen Reactions  . Benzonatate Anaphylaxis and Hives  . Fluticasone Furoate-Vilanterol Hives and Anaphylaxis  . Iodine Hives, Swelling and Other (See Comments)    And Betadine/caused hives and swelling  . Latex Hives, Itching, Rash and Other (See Comments)    Burning  . Simvastatin Other (See  Comments)    Caused neuropathy in arms Neuropathy in arms  . Bupropion Other (See Comments)    Negative thoughts. Crazy thoughts  . Levofloxacin Other (See Comments)    Joint aches//leg, shoulder pain Joint pain  . Penicillins Hives  . Povidone-Iodine Hives  . Promethazine Nausea Only and Other (See Comments)    Doesn't work  . Amoxicillin Hives    Blisters Did it involve swelling of the face/tongue/throat, SOB, or low BP? Yes Did it involve sudden or severe rash/hives, skin peeling, or any reaction on the inside of your mouth or nose? Yes Did you need to seek medical attention at a hospital or doctor's office?Unknown When did it last happen?2019 If all above answers are "NO", may proceed with cephalosporin use.   . Chlorthalidone Other (See Comments)  . Diflucan [Fluconazole] Hives and Other (See Comments)    blister  . Doxycycline Itching and Diarrhea   Current Meds  Medication Sig  . albuterol (PROVENTIL HFA;VENTOLIN  HFA) 108 (90 Base) MCG/ACT inhaler Inhale 1-2 puffs into the lungs every 6 (six) hours as needed for wheezing or shortness of breath.  Marland Kitchen amLODipine (NORVASC) 10 MG tablet Take 1 tablet by mouth daily.  Marland Kitchen aspirin EC 81 MG tablet Take 1 tablet (81 mg total) by mouth daily. Swallow whole.  . Carboxymethylcellul-Glycerin (REFRESH OPTIVE PF OP) Apply 1 drop to eye 3 (three) times daily as needed (dry eyes).  . cetirizine (ZYRTEC) 10 MG tablet Take 10 mg by mouth daily.  Marland Kitchen desoximetasone (TOPICORT) 0.25 % cream Apply 1 application topically 4 (four) times daily as needed (rash (applied to left ear daily)).   . diclofenac sodium (VOLTAREN) 1 % GEL Apply 2 g topically 2 (two) times daily as needed (knee pain).   . Flaxseed, Linseed, OIL Take 1,000 mg by mouth daily.   . fluticasone (FLONASE) 50 MCG/ACT nasal spray Place 2 sprays into both nostrils daily.  Marland Kitchen FLUZONE HIGH-DOSE QUADRIVALENT 0.7 ML SUSY   . gabapentin (NEURONTIN) 300 MG capsule Take 600 mg by mouth  daily with supper.   . insulin NPH Human (HUMULIN N,NOVOLIN N) 100 UNIT/ML injection Inject 40-75 Units into the skin See admin instructions. Inject 75 units subcutaneously at breakfast, 40 units at lunch & 65 units at supper  . insulin regular (NOVOLIN R,HUMULIN R) 100 units/mL injection Inject 30-45 Units into the skin See admin instructions. Inject 30 units subcutaneously in the morning, 30 units at lunch & 30 units at supper  . irbesartan (AVAPRO) 300 MG tablet Take 1 tablet by mouth daily.  . metFORMIN (GLUCOPHAGE-XR) 500 MG 24 hr tablet Take 1,000 mg by mouth 2 (two) times daily.  . Methylcobalamin (B12-ACTIVE PO) Take 0.5 tablets by mouth at bedtime.  Marland Kitchen NAPROSYN 500 MG tablet Take 500 mg by mouth as needed.  Marland Kitchen omeprazole (PRILOSEC) 40 MG capsule Take 40 mg by mouth 2 (two) times daily.  Marland Kitchen PARoxetine (PAXIL) 20 MG tablet Take 10 mg by mouth daily.   . rosuvastatin (CRESTOR) 10 MG tablet Take 1 tablet (10 mg total) by mouth daily.  . SYMBICORT 160-4.5 MCG/ACT inhaler 2 puffs daily as needed.  Marland Kitchen terconazole (TERAZOL 3) 0.8 % vaginal cream Place 1 applicator vaginally as needed (yeast infections).   . vitamin C (ASCORBIC ACID) 500 MG tablet Take 500 mg by mouth 2 (two) times a day.   . [DISCONTINUED] Multiple Minerals-Vitamins (CAL MAG ZINC +D3 PO) Take 2 tablets by mouth daily.   Past Medical History:  Diagnosis Date  . Allergy   . Asthma    seasonal  . Cellulitis 2013   Left toe  . Cirrhosis ([place])   . Common migraine    History of  . Complication of anesthesia    Per pt/had breathing problems with "block" during rotator cuff surgery. 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  . DDD (degenerative disc disease)   . Depression    denies takes paxil for migraines  . Diabetes mellitus    type 2  . Diabetic peripheral neuropathy ([place])   . Diverticulitis   . Diverticulosis   . DJD (degenerative joint disease)   . GERD (gastroesophageal reflux disease)   . History of colon polyps     hyperplastic  . History of gastric polyp   . Hyperlipidemia   . Hypertension   . Iron deficiency anemia   . Obesity   . OSA on CPAP    cpap  . Pneumonia    april 2020  mild  .  Primary localized osteoarthritis of left knee 03/27/2019  . Sensorineural hearing loss    Past Surgical History:  Procedure Laterality Date  . ANKLE SURGERY     Left   . COLONOSCOPY  05/2018  . LEEP N/A 09/14/2018   Procedure: LOOP ELECTROSURGICAL EXCISION PROCEDURE (LEEP);  Surgeon: Arvella Nigh, MD;  Location: [facility];  Service: Gynecology;  Laterality: N/A;  . ROTATOR CUFF REPAIR Bilateral 2012, 2015  . TONSILLECTOMY    . TOTAL KNEE ARTHROPLASTY Left 04/08/2019   Procedure: TOTAL KNEE ARTHROPLASTY;  Surgeon: Elsie Saas, MD;  Location: [facility];  Service: Orthopedics;  Laterality: Left;  . UPPER GI ENDOSCOPY  05/2018   Social History   Social History Narrative   1 caffeine drink daily    Right-handed   Lives at home with husband.   family history includes Bone cancer in her mother; Breast cancer in her mother; Heart failure in her father.   Review of Systems As per HPI  Objective:   Physical Exam BP 130/60   Pulse 71   Ht 5' 4.5" (1.638 m)   Wt 223 lb (101.2 kg)   BMI 37.69 kg/m  Mildly tender LUQ abd obese No stigmata cld Lungs heart ok No edema A and o x 3  33 minutes total time before during and after interaction with patient

## 2020-08-21 ENCOUNTER — Encounter: Payer: Self-pay | Admitting: Internal Medicine

## 2020-08-21 DIAGNOSIS — K7460 Unspecified cirrhosis of liver: Secondary | ICD-10-CM | POA: Insufficient documentation

## 2020-08-21 DIAGNOSIS — E8881 Metabolic syndrome: Secondary | ICD-10-CM | POA: Insufficient documentation

## 2020-08-21 DIAGNOSIS — E66812 Obesity, class 2: Secondary | ICD-10-CM | POA: Insufficient documentation

## 2020-08-21 DIAGNOSIS — D696 Thrombocytopenia, unspecified: Secondary | ICD-10-CM | POA: Insufficient documentation

## 2020-08-21 NOTE — Assessment & Plan Note (Addendum)
I explained that we have no particular treatment for her cirrhosis other than managing potential complications or existing complications and I think weight loss is her most important thing she could try to accomplish though that is difficult given that she is an insulin-dependent type 2 diabetic.  However its not impossible.  Unfortunately she has a lot of stress with her husband who is ill.  She should have a repeat EGD within the next year given that she has some ascites technically she is slightly decompensated though she is a child's class a 6 points with a good prognosis for now.  She cannot afford to schedule an EGD between time and money at this point so we will plan for recall in the spring to reassess.  I would anticipate seeing her about twice a year routinely.  Continue low-sodium diet as recommended by Ellouise Newer, PA-C  She does not drink and was told to avoid alcohol going forward.  Needs repeat cross-sectional imaging to screen for hepatocellular carcinoma in August 2022.  I have recommended she look at the American liver foundation website for further information about cirrhosis.  I have suggested she look at the diet doctor website and to consider a lower carbohydrate diet and it sounds like what she is doing with primary care to try to stop eating when her blood sugar is low normal or a little low is smart.  Given that she is on insulin intermittent fasting though not impossible is clearly tricky and would require coordination with the physician that can do that.  I do not think she has the ability to participate in a program like that though I did mention there was one at [place] with Dr. Enrigue Catena.  Not sure why she has left upper quadrant pain right now but splenomegaly may have something to do with that.  EGD would help sort that out more but she is unwilling to schedule right now.  I have given her a prescription for hepatitis a and B vaccine to take to her pharmacy where it  will be cheaper for her to have that.  I have recommended she proceed with the Covid booster.

## 2020-08-27 ENCOUNTER — Encounter: Payer: Self-pay | Admitting: General Practice

## 2020-08-27 NOTE — Progress Notes (Signed)
[place] Psychosocial Distress Screening Clinical Social Work  Clinical Social Work was referred by distress screening protocol.  The patient scored a 8 on the Psychosocial Distress Thermometer which indicates moderate distress. Clinical Social Worker contacted patient by phone to assess for distress and other psychosocial needs. Unable to reach by phone, left VM w my contact information and encouragement to call back at her convenience for support/resources.  Left VM w my contact information and request to call back for support/resources if desired.   ON[facility] DISTRESS SCREENING 08/20/2020  Screening Type Initial Screening  Distress experienced in past week (1-10) 8  Practical problem type (No Data)  Family Problem type Partner  Information Concerns Type Lack of info about diagnosis  Referral to clinical social work Yes    Clinical Social Worker follow up needed: No.  If yes, follow up plan:  Beverely Pace, [place], LCSW Clinical Social Worker Phone:  [phone]

## 2020-08-28 ENCOUNTER — Other Ambulatory Visit: Payer: Medicare HMO

## 2020-09-07 ENCOUNTER — Other Ambulatory Visit: Payer: Medicare HMO

## 2020-09-09 ENCOUNTER — Ambulatory Visit: Payer: Medicare HMO | Admitting: Cardiology

## 2020-09-10 ENCOUNTER — Ambulatory Visit: Payer: Medicare HMO | Admitting: Cardiology

## 2020-09-21 ENCOUNTER — Telehealth: Payer: Self-pay | Admitting: Hematology

## 2020-09-21 NOTE — Telephone Encounter (Signed)
IZXYOFV:[id number] Faxed last ov note to [facility] per their request to fax [phone]

## 2020-09-22 ENCOUNTER — Other Ambulatory Visit: Payer: Medicare HMO

## 2020-09-23 DIAGNOSIS — F329 Major depressive disorder, single episode, unspecified: Secondary | ICD-10-CM | POA: Diagnosis not present

## 2020-09-23 DIAGNOSIS — E119 Type 2 diabetes mellitus without complications: Secondary | ICD-10-CM | POA: Diagnosis not present

## 2020-09-23 DIAGNOSIS — K7460 Unspecified cirrhosis of liver: Secondary | ICD-10-CM | POA: Diagnosis not present

## 2020-09-23 DIAGNOSIS — G629 Polyneuropathy, unspecified: Secondary | ICD-10-CM | POA: Diagnosis not present

## 2020-09-23 DIAGNOSIS — R188 Other ascites: Secondary | ICD-10-CM | POA: Diagnosis not present

## 2020-09-23 DIAGNOSIS — I10 Essential (primary) hypertension: Secondary | ICD-10-CM | POA: Diagnosis not present

## 2020-09-23 DIAGNOSIS — K760 Fatty (change of) liver, not elsewhere classified: Secondary | ICD-10-CM | POA: Diagnosis not present

## 2020-09-23 DIAGNOSIS — C8300 Small cell B-cell lymphoma, unspecified site: Secondary | ICD-10-CM | POA: Diagnosis not present

## 2020-09-28 ENCOUNTER — Ambulatory Visit: Payer: Medicare HMO | Admitting: Cardiology

## 2020-09-30 ENCOUNTER — Ambulatory Visit: Payer: Medicare HMO | Admitting: Cardiology

## 2020-10-14 ENCOUNTER — Ambulatory Visit: Payer: Medicare HMO | Admitting: Cardiology

## 2020-10-27 DIAGNOSIS — Z794 Long term (current) use of insulin: Secondary | ICD-10-CM | POA: Diagnosis not present

## 2020-10-27 DIAGNOSIS — I10 Essential (primary) hypertension: Secondary | ICD-10-CM | POA: Diagnosis not present

## 2020-10-27 DIAGNOSIS — E119 Type 2 diabetes mellitus without complications: Secondary | ICD-10-CM | POA: Diagnosis not present

## 2020-10-27 DIAGNOSIS — N182 Chronic kidney disease, stage 2 (mild): Secondary | ICD-10-CM | POA: Diagnosis not present

## 2020-10-27 DIAGNOSIS — I129 Hypertensive chronic kidney disease with stage 1 through stage 4 chronic kidney disease, or unspecified chronic kidney disease: Secondary | ICD-10-CM | POA: Diagnosis not present

## 2020-11-21 ENCOUNTER — Other Ambulatory Visit: Payer: Self-pay | Admitting: Cardiology

## 2020-12-21 ENCOUNTER — Telehealth: Payer: Self-pay | Admitting: Hematology

## 2020-12-21 NOTE — Telephone Encounter (Signed)
Left message with rescheduled upcoming appointment due to provider's PAL. Gave option to call back to reschedule if needed. 

## 2020-12-23 ENCOUNTER — Telehealth: Payer: Self-pay | Admitting: Hematology

## 2020-12-23 NOTE — Telephone Encounter (Signed)
Rescheduled upcoming appointment per patient's request. Patient is aware of changes. 

## 2020-12-30 ENCOUNTER — Ambulatory Visit: Payer: Medicare HMO | Admitting: Hematology

## 2020-12-30 ENCOUNTER — Other Ambulatory Visit: Payer: Medicare HMO

## 2021-01-04 ENCOUNTER — Telehealth: Payer: Self-pay | Admitting: Hematology

## 2021-01-04 NOTE — Telephone Encounter (Signed)
R/s appt per 4/25 sch msg. Pt aware.  

## 2021-01-11 NOTE — Progress Notes (Signed)
PATIENT: Janice [name] DOB: 12-08-1950  REASON FOR VISIT: follow up HISTORY FROM: patient  Virtual Visit via Telephone Note  I connected with Janice [name] on 01/12/21 at  7:30 AM EDT by telephone and verified that I am speaking with the correct person using two identifiers.   I discussed the limitations, risks, security and privacy concerns of performing an evaluation and management service by telephone and the availability of in person appointments. I also discussed with the patient that there may be a patient responsible charge related to this service. The patient expressed understanding and agreed to proceed.   History of Present Illness:  01/12/21 ALL: Janice [name] returns for follow up for OSA on CPAP. She continues to do very well with therapy. She lost her husband this year and has recently been diagnosed with non hodgkin's lymphoma. She has a lymph node on her scalp that is being looked at by oncology this week. She does have some difficulty with her headgear rubbing that area. She has not slept as soundly since losing her husband. She is followed closely by PCP and has a great support system.      01/07/2020 ALL:  Janice [name] is a 70 y.o. female here today for follow up for OSA on CPAP. She is doing well. She is complaint with daily usage.  Compliance report dated 12/07/2019 through 01/05/2020 reveals that she is used CPAP every day for compliance of 100%.  She used CPAP greater than 4 hours every day for compliance of 100%.  Average usage was 7 hours and 28 minutes.  Residual AHI was 0.4 on 5 to 20 cm of water and an EPR of 3.  There was no significant leak noted.   History (copied from [employer] note on 08/27/2018)   UPDATE 12/16/2019CM Janice [name], 70 year old female returns for follow-up with a history of obstructive sleep apnea here for CPAP compliance.  She has had no problems with her machine. Compliance data dated 07/28/2018-08/26/2018 shows compliance  greater than 4 hours at 100%.  Average usage 7 hours 34 minutes.  Set pressure 5 to 20 cm.  EPR level 3 leak 95th percentile 0.3.  AHI 0.6 ESS 0 she returns for reevaluation  Janice [name] a 70 y.o.female, seen here in a RV: 05-25-2017, the patient was issued a new CPAP after a HST confirmed the presence of OSA: She is a compliant user now, but she experienced bronchitis, Pneumonia and constant non- productive cough at night. Spasms of coughing, after 2 antibiotic rounds.  She has been prescribed a narcotic cough syrup with codeine and tried to discontinue this medication but couldn't. Just coughing too much still at night to get restful sleep, just last night took some cough syrup to allow her to sleep. She reports that the CPAP is not pleasant and comfortable to wear while she has sees respiratory tract infections which may partially be viral and partially be allergic in origin. I would like for her to start to take a daily Claritin or Allegra, nondrowsy making to at least eliminate the possibility of allergies playing a role.She gets choked during the day, too. No sleep related, not even food intake related. Epiglottis spasm? I will give her the medical leave from CPAP use for the next7days until she has completely recovered. She should then start CPAP again.   Observations/Objective:  Generalized: Well developed, in no acute distress  Mentation: Alert oriented to time, place, history taking. Follows all commands speech and language fluent  Assessment and Plan:  70 y.o. year old female  has a past medical history of Allergy, Asthma, Cellulitis (2013), Cirrhosis ([place]), Common migraine, Complication of anesthesia, DDD (degenerative disc disease), Depression, Diabetes mellitus, Diabetic peripheral neuropathy ([place]), Diverticulitis, Diverticulosis, DJD (degenerative joint disease), GERD (gastroesophageal reflux disease), History of colon polyps, History of gastric polyp, Hyperlipidemia,  Hypertension, Iron deficiency anemia, Obesity, OSA on CPAP, Pneumonia, Primary localized osteoarthritis of left knee (03/27/2019), and Sensorineural hearing loss. here with    ICD-10-CM   1. Obstructive sleep apnea treated with continuous positive airway pressure (CPAP)  G47.33 For home use only DME continuous positive airway pressure (CPAP)   Z99.89      She is doing very well with CPAP therapy.  Compliance report reveals excellent compliance.  I have encouraged her to continue using CPAP nightly and for greater than 4 hours each night.  We will send orders to [facility] for supplies for the next year.  She will follow up with oncology this week to assess swollen lymph node of scalp. May consider reaching out to [facility] regarding head gear options if it continues to be irritated. She will continue close follow up with PCP. She was encouraged to work on healthy lifestyle habits with well-balanced diet and regular exercise.  She will follow-up with [place] in 1 year, sooner if needed.  She verbalizes understanding and agreement with this plan.   Orders Placed This Encounter  Procedures  . For home use only DME continuous positive airway pressure (CPAP)    Supplies    Order Specific Question:   Length of Need    Answer:   [employer]    Order Specific Question:   Patient has OSA or probable OSA    Answer:   Yes    Order Specific Question:   Is the patient currently using CPAP in the home    Answer:   Yes    Order Specific Question:   Settings    Answer:   Other see comments    Order Specific Question:   CPAP supplies needed    Answer:   Mask, headgear, cushions, filters, heated tubing and water chamber    No orders of the defined types were placed in this encounter.    Follow Up Instructions:  I discussed the assessment and treatment plan with the patient. The patient was provided an opportunity to ask questions and all were answered. The patient agreed with the plan and demonstrated an  understanding of the instructions.   The patient was advised to call back or seek an in-person evaluation if the symptoms worsen or if the condition fails to improve as anticipated.  I provided 15 minutes of non-face-to-face time during this encounter.  Patient is located at her place of residence during my chart visit.  Provider is in the office.   Debbora Presto, NP

## 2021-01-12 ENCOUNTER — Telehealth (INDEPENDENT_AMBULATORY_CARE_PROVIDER_SITE_OTHER): Payer: HMO | Admitting: Family Medicine

## 2021-01-12 ENCOUNTER — Encounter: Payer: Self-pay | Admitting: Family Medicine

## 2021-01-12 DIAGNOSIS — Z9989 Dependence on other enabling machines and devices: Secondary | ICD-10-CM

## 2021-01-12 DIAGNOSIS — G4733 Obstructive sleep apnea (adult) (pediatric): Secondary | ICD-10-CM | POA: Diagnosis not present

## 2021-01-13 NOTE — Progress Notes (Signed)
Cm sent to [facility] 

## 2021-01-14 ENCOUNTER — Other Ambulatory Visit: Payer: HMO

## 2021-01-14 NOTE — Progress Notes (Signed)
HEMATOLOGY/ONCOLOGY CLINIC NOTE  Date of Service: 01/15/2021  Patient Care Team: Chilton GreathouseAvva, Ravisankar, MD as PCP - General (Internal Medicine)  CHIEF COMPLAINTS/PURPOSE OF CONSULTATION:  CLL/SLL  HISTORY OF PRESENTING ILLNESS:   Janice [name] is a wonderful 70 y.o. female who has been referred to us by Dr. 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 The pt reports that she is doing well overall.   The pt reports that her last mammogram was a part of routine screening. She had no constitutional symptoms prior and while she has felt differently over the last year she believes this was due to her other medical concerns. Pt reports sharp, intermittent abdominal pain.   Pt was supposed to have a knee replacement in 2019, but couldn't due to iron deficiency anemia. She was able to complete her left knee replacement in mid-2020. Pt contracted the COVID19 virus in 2020. Pt received her COVID19 vaccines in May.   Pt uses Diclofenac topically for her left arm pain and left knee pain. She has had intermittent rashes that arise in the areas that she receives her insulin injections.   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   Of note prior to the patient's visit today, pt has had MM Breast ([id number]) completed on 02/28/2020 with results revealing "Bilateral axillary adenopathy."  Pt has had Left axilla lymph node biopsy ([id number]) completed on 03/30/2020 with results revealing "CHRONIC LYMPHOCYTIC LYMPHOMA/SMALL LYMPHOCYTIC LYMPHOMA".   Pt has had Left Axilla Flow Pathology Report [id number]) completed on 03/30/2020 with results revealing "Monoclonal B-cell population with coexpression of CD5 comprises 80% of all lymphocytes".  Most recent lab results (03/11/2020) of CBC is as follows: all values are WNL except for PLT at 99K.  On review of systems, pt reports rash, watery stools, abdominal pain and denies fevers, chills, night sweats,  unexpected weight loss and any other symptoms.   On PMHx the pt reports Type II Diabetes, DJD, HLD, HTN, Iron deficiency anemia, Arthritis, Total knee arthroplasty.  On Social Hx the pt reports that she is a non-smoker.  On Family Hx the pt reports that her mother was diagnosed with breast cancer at 247. She also had two maternal aunts who had breast cancer.   INTERVAL HISTORY: Janice PriestlySara Brenda Lufkin is a wonderful 70 y.o. 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 The patient's last visit with us was on 08/20/2020. The pt reports that she is doing well overall.  The pt reports that she has been experiencing headaches and fatigue recently. The pt notes some issues with the lump on the back of her neck rubbing against her CPAP machine at night and causing some pain. She has been waking up and needing to adjust her mask to alleviate the pain. The pt notes some issues recently with controlling her blood sugars. She is currently on both the long acting and short acting insulin. She has noted some issues with it being low and it being higher. She continues to manage this with her PCP and is actively trying to control this. The pt notes she has been as low as in the 40s. The pt notes the lump could be slightly larger. The pt also notes that since December she has been experiencing some bulging out near her navel. The pt notes her husband passed away in February and she has been under much stress. She has since moved in with her sister and that has been giving  her more peace.  Lab results today 01/15/2021 of CBC w/diff and CMP is as follows: all values are WNL except for WBC of 13.1K, Plt of 90K, Lymphs Abs of 8.0K, Glucose of 316, Total Protein of 6.4. 01/15/2021 LDH of 191.  On review of systems, pt reports headaches, fatigue, stress and denies sudden weight loss, fevers, chills, new lumps/bumps, sudden weight loss, abdominal pain, leg swelling, and any other symptoms.   MEDICAL HISTORY:  Past  Medical History:  Diagnosis Date  . Allergy   . Asthma    seasonal  . Cellulitis 2013   Left toe  . Cirrhosis ([place])   . Common migraine    History of  . Complication of anesthesia    Per pt/had breathing problems with "block" during rotator cuff surgery. 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  . DDD (degenerative disc disease)   . Depression    denies takes paxil for migraines  . Diabetes mellitus    type 2  . Diabetic peripheral neuropathy ([place])   . Diverticulitis   . Diverticulosis   . DJD (degenerative joint disease)   . GERD (gastroesophageal reflux disease)   . History of colon polyps    hyperplastic  . History of gastric polyp   . Hyperlipidemia   . Hypertension   . Iron deficiency anemia   . Obesity   . OSA on CPAP    cpap  . Pneumonia    april 2020  mild  . Primary localized osteoarthritis of left knee 03/27/2019  . Sensorineural hearing loss     SURGICAL HISTORY: Past Surgical History:  Procedure Laterality Date  . ANKLE SURGERY     Left   . COLONOSCOPY  05/2018  . LEEP N/A 09/14/2018   Procedure: LOOP ELECTROSURGICAL EXCISION PROCEDURE (LEEP);  Surgeon: Arvella Nigh, MD;  Location: [facility];  Service: Gynecology;  Laterality: N/A;  . ROTATOR CUFF REPAIR Bilateral 2012, 2015  . TONSILLECTOMY    . TOTAL KNEE ARTHROPLASTY Left 04/08/2019   Procedure: TOTAL KNEE ARTHROPLASTY;  Surgeon: Elsie Saas, MD;  Location: [facility];  Service: Orthopedics;  Laterality: Left;  . UPPER GI ENDOSCOPY  05/2018    SOCIAL HISTORY: Social History   Socioeconomic History  . Marital status: Widowed    Spouse name: Not on file  . Number of children: 0  . Years of education: 3 years college  . Highest education level: Not on file  Occupational History  . Occupation: Unemployed  Tobacco Use  . Smoking status: Never Smoker  . Smokeless tobacco: Never Used  Vaping Use  . Vaping Use: Never used  Substance and Sexual Activity  . Alcohol use: Not Currently     Comment: very rare  . Drug use: No  . Sexual activity: Not Currently    Birth control/protection: None, Post-menopausal  Other Topics Concern  . Not on file  Social History Narrative   1 caffeine drink daily    Right-handed   Lives at home with husband.   Social Determinants of Health   Financial Resource Strain: Medium Risk  . Difficulty of Paying Living Expenses: Somewhat hard  Food Insecurity: No Food Insecurity  . Worried About Charity fundraiser in the Last Year: Never true  . Ran Out of Food in the Last Year: Never true  Transportation Needs: No Transportation Needs  . Lack of Transportation (Medical): No  . Lack of Transportation (Non-Medical): No  Physical Activity: Not on file  Stress: Not on file  Social Connections: Socially Integrated  . Frequency of Communication with Friends and Family: More than three times a week  . Frequency of Social Gatherings with Friends and Family: More than three times a week  . Attends Religious Services: More than 4 times per year  . Active Member of Clubs or Organizations: Yes  . Attends Archivist Meetings: More than 4 times per year  . Marital Status: Married  Human resources officer Violence: Not on file    FAMILY HISTORY: Family History  Problem Relation Age of Onset  . Breast cancer Mother   . Bone cancer Mother   . Heart failure Father   . Colon cancer Neg Hx   . Rectal cancer Neg Hx   . Stomach cancer Neg Hx   . Esophageal cancer Neg Hx   . Pancreatic cancer Neg Hx   . Liver disease Neg Hx     ALLERGIES:  is allergic to benzonatate, fluticasone furoate-vilanterol, iodine, latex, simvastatin, bupropion, levofloxacin, penicillins, povidone-iodine, promethazine, amoxicillin, chlorthalidone, diflucan [fluconazole], and doxycycline.  MEDICATIONS:  Current Outpatient Medications  Medication Sig Dispense Refill  . albuterol (PROVENTIL HFA;VENTOLIN HFA) 108 (90 Base) MCG/ACT inhaler Inhale 1-2 puffs into the lungs  every 6 (six) hours as needed for wheezing or shortness of breath.    Marland Kitchen amLODipine (NORVASC) 10 MG tablet Take 1 tablet by mouth daily.    Marland Kitchen aspirin EC 81 MG tablet Take 1 tablet (81 mg total) by mouth daily. Swallow whole. 90 tablet 3  . Carboxymethylcellul-Glycerin (REFRESH OPTIVE PF OP) Apply 1 drop to eye 3 (three) times daily as needed (dry eyes).    . cetirizine (ZYRTEC) 10 MG tablet Take 10 mg by mouth daily.    Marland Kitchen desoximetasone (TOPICORT) 0.25 % cream Apply 1 application topically 4 (four) times daily as needed (rash (applied to left ear daily)).     . diclofenac sodium (VOLTAREN) 1 % GEL Apply 2 g topically 2 (two) times daily as needed (knee pain).   2  . Flaxseed, Linseed, OIL Take 1,000 mg by mouth daily.     . fluticasone (FLONASE) 50 MCG/ACT nasal spray Place 2 sprays into both nostrils daily.    Marland Kitchen FLUZONE HIGH-DOSE QUADRIVALENT 0.7 ML SUSY     . gabapentin (NEURONTIN) 300 MG capsule Take 600 mg by mouth daily with supper.     . insulin NPH Human (HUMULIN N,NOVOLIN N) 100 UNIT/ML injection Inject 40-75 Units into the skin See admin instructions. Inject 75 units subcutaneously at breakfast, 40 units at lunch & 65 units at supper    . insulin regular (NOVOLIN R,HUMULIN R) 100 units/mL injection Inject 30-45 Units into the skin See admin instructions. Inject 30 units subcutaneously in the morning, 30 units at lunch & 30 units at supper    . irbesartan (AVAPRO) 300 MG tablet Take 1 tablet by mouth daily.    . metFORMIN (GLUCOPHAGE-XR) 500 MG 24 hr tablet Take 1,000 mg by mouth 2 (two) times daily.    . Methylcobalamin (B12-ACTIVE PO) Take 0.5 tablets by mouth at bedtime.    Marland Kitchen NAPROSYN 500 MG tablet Take 500 mg by mouth as needed.    Marland Kitchen omeprazole (PRILOSEC) 40 MG capsule Take 40 mg by mouth 2 (two) times daily.    Marland Kitchen PARoxetine (PAXIL) 20 MG tablet Take 10 mg by mouth daily.     . rosuvastatin (CRESTOR) 10 MG tablet Take 1 tablet by mouth once daily 180 tablet 0  . spironolactone  (ALDACTONE) 50 MG tablet  Take 1 tablet (50 mg total) by mouth daily. 30 tablet 6  . SYMBICORT 160-4.5 MCG/ACT inhaler 2 puffs daily as needed.    Marland Kitchen terconazole (TERAZOL 3) 0.8 % vaginal cream Place 1 applicator vaginally as needed (yeast infections).     . vitamin C (ASCORBIC ACID) 500 MG tablet Take 500 mg by mouth 2 (two) times a day.      No current facility-administered medications for this visit.    REVIEW OF SYSTEMS:   10 Point review of Systems was done is negative except as noted above.   PHYSICAL EXAMINATION: ECOG PERFORMANCE STATUS: 1 - Symptomatic but completely ambulatory   GENERAL:alert, in no acute distress and comfortable SKIN: no acute rashes, no significant lesions EYES: conjunctiva are pink and non-injected, sclera anicteric OROPHARYNX: MMM, no exudates, no oropharyngeal erythema or ulceration NECK: supple, no JVD LYMPH:  no palpable lymphadenopathy in the cervical or inguinal regions. Small left axillary lymph node unchanged from last visit. LUNGS: clear to auscultation b/l with normal respiratory effort HEART: regular rate & rhythm ABDOMEN:  normoactive bowel sounds , non tender, not distended. No palpable hepatosplenomegaly.  Extremity: no pedal edema PSYCH: alert & oriented x 3 with fluent speech NEURO: no focal motor/sensory deficits  LABORATORY DATA:  I have reviewed the data as listed  . CBC Latest Ref Rng & Units 01/15/2021 08/20/2020 08/03/2020  WBC 4.0 - 10.5 K/uL 13.1(H) 9.9 10.2  Hemoglobin 12.0 - 15.0 g/dL 12.1 11.8(L) 12.5  Hematocrit 36.0 - 46.0 % 38.3 37.3 37.7  Platelets 150 - 400 K/uL 90(L) 84(L) 87.0(L)   . CBC    Component Value Date/Time   WBC 13.1 (H) 01/15/2021 0901   RBC 4.36 01/15/2021 0901   HGB 12.1 01/15/2021 0901   HGB 12.8 03/11/2020 1315   HCT 38.3 01/15/2021 0901   HCT 40.1 03/11/2020 1315   PLT 90 (L) 01/15/2021 0901   PLT 99 (LL) 03/11/2020 1315   MCV 87.8 01/15/2021 0901   MCV 87 03/11/2020 1315   MCH 27.8 01/15/2021  0901   MCHC 31.6 01/15/2021 0901   RDW 14.3 01/15/2021 0901   RDW 14.0 03/11/2020 1315   LYMPHSABS 8.0 (H) 01/15/2021 0901   MONOABS 0.7 01/15/2021 0901   EOSABS 0.1 01/15/2021 0901   BASOSABS 0.1 01/15/2021 0901     . CMP Latest Ref Rng & Units 01/15/2021 08/20/2020 07/21/2020  Glucose 70 - 99 mg/dL 316(H) 195(H) 91  BUN 8 - 23 mg/dL 12 13 12   Creatinine 0.44 - 1.00 mg/dL 0.82 0.83 0.89  Sodium 135 - 145 mmol/L 138 140 138  Potassium 3.5 - 5.1 mmol/L 4.4 4.9 4.4  Chloride 98 - 111 mmol/L 102 105 103  CO2 22 - 32 mmol/L 28 25 26   Calcium 8.9 - 10.3 mg/dL 8.9 9.4 9.1  Total Protein 6.5 - 8.1 g/dL 6.4(L) 6.5 -  Total Bilirubin 0.3 - 1.2 mg/dL 0.7 0.6 -  Alkaline Phos 38 - 126 U/L 75 73 -  AST 15 - 41 U/L 17 20 -  ALT 0 - 44 U/L 21 25 -   04/08/2020 FISH/CLL Panel:   03/30/2020 Left Axilla Flow Pathology Report [id number]):   03/30/2020 Left axilla lymph node Bx ([id number]):   RADIOGRAPHIC STUDIES: I have personally reviewed the radiological images as listed and agreed with the findings in the report. No results found.  ASSESSMENT & PLAN:   70 yo with   1) CLL/SLL -02/28/2020 MM Breast ([id number]) revealed "Bilateral axillary adenopathy." -03/30/2020 Left axilla lymph  node biopsy ([id number]) revealed "CHRONIC LYMPHOCYTIC LYMPHOMA/SMALL LYMPHOCYTIC LYMPHOMA".  -03/30/2020 Left Axilla Flow Pathology Report [id number]) revealed "Monoclonal B-cell population with coexpression of CD5 comprises 80% of all lymphocytes". -04/20/20 PET/CT revealed 1. Adenopathy within the neck, chest, abdomen, and pelvis, consistent with active lymphoma. Much of this is not significantly hypermetabolic. Some upper abdominal nodes are moderately hypermetabolic. 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 Glucose  elevated. Other chemistries normal. LDH normal. -Advised pt that the fluctuations in blood sugar can cause both fatigue and headaches.  -Recommended pt increase her water intake as uncontrolled sugars can cause some dehydration. -Recommended cool compresses in the small lymph node area.  -Advised pt we are not particularly concerned with any changes related to the CLL at this point. -Recommended pt receive the second COVID booster shot as recently approved. Advised pt to wait 4-6 months following first booster shot before getting this. -Discussed Evusheld and pt's eligibility. Will send referral. Advised pt to wait around one month after this before getting the second booster shot. -No lab or clinical evidence of CLL progression requiring treatment at this time. Will continue watchful observation. -Advised pt that de-stressing, maintaining ideal bodyweight, maintaining high activity levels, and eating a well-balanced diet is good for overall health.  -Continue to f/u w PCP regarding blood sugar and insulin management. -Will see back in 4 months with labs.   FOLLOW UP: RTC with Dr Irene Limbo with labs in 4 months Evusheld referral   The total time spent in the appt was 20 minutes and more than 50% was on counseling and direct patient cares.  All of the patient's questions were answered with apparent satisfaction. The patient knows to call the clinic with any problems, questions or concerns.    Sullivan Lone MD [place] AAHIVMS [facility] Hematology/Oncology Physician [facility]  (Office):       [phone] (Work cell):  [phone] (Fax):           [phone]  01/15/2021 10:11 AM  I, Reinaldo Raddle, am acting as scribe for Dr. Sullivan Lone, MD.     .I have reviewed the above documentation for accuracy and completeness, and I agree with the above. Brunetta Genera MD

## 2021-01-15 ENCOUNTER — Ambulatory Visit: Payer: Medicare HMO | Admitting: Hematology

## 2021-01-15 ENCOUNTER — Inpatient Hospital Stay: Payer: HMO | Admitting: Hematology

## 2021-01-15 ENCOUNTER — Other Ambulatory Visit: Payer: Medicare HMO

## 2021-01-15 ENCOUNTER — Inpatient Hospital Stay: Payer: HMO | Attending: Hematology

## 2021-01-15 ENCOUNTER — Other Ambulatory Visit: Payer: Self-pay

## 2021-01-15 VITALS — BP 139/48 | HR 72 | Temp 97.7°F | Resp 18 | Ht 64.5 in | Wt 218.3 lb

## 2021-01-15 DIAGNOSIS — C8300 Small cell B-cell lymphoma, unspecified site: Secondary | ICD-10-CM

## 2021-01-15 DIAGNOSIS — C9110 Chronic lymphocytic leukemia of B-cell type not having achieved remission: Secondary | ICD-10-CM | POA: Diagnosis not present

## 2021-01-15 DIAGNOSIS — R109 Unspecified abdominal pain: Secondary | ICD-10-CM | POA: Insufficient documentation

## 2021-01-15 DIAGNOSIS — E118 Type 2 diabetes mellitus with unspecified complications: Secondary | ICD-10-CM | POA: Insufficient documentation

## 2021-01-15 DIAGNOSIS — M1990 Unspecified osteoarthritis, unspecified site: Secondary | ICD-10-CM | POA: Diagnosis not present

## 2021-01-15 DIAGNOSIS — I10 Essential (primary) hypertension: Secondary | ICD-10-CM | POA: Insufficient documentation

## 2021-01-15 DIAGNOSIS — K7460 Unspecified cirrhosis of liver: Secondary | ICD-10-CM | POA: Diagnosis not present

## 2021-01-15 DIAGNOSIS — D696 Thrombocytopenia, unspecified: Secondary | ICD-10-CM | POA: Diagnosis not present

## 2021-01-15 DIAGNOSIS — Z803 Family history of malignant neoplasm of breast: Secondary | ICD-10-CM | POA: Insufficient documentation

## 2021-01-15 DIAGNOSIS — Z96652 Presence of left artificial knee joint: Secondary | ICD-10-CM | POA: Diagnosis not present

## 2021-01-15 LAB — CMP (CANCER CENTER ONLY)
ALT: 21 U/L (ref 0–44)
AST: 17 U/L (ref 15–41)
Albumin: 4.2 g/dL (ref 3.5–5.0)
Alkaline Phosphatase: 75 U/L (ref 38–126)
Anion gap: 8 (ref 5–15)
BUN: 12 mg/dL (ref 8–23)
CO2: 28 mmol/L (ref 22–32)
Calcium: 8.9 mg/dL (ref 8.9–10.3)
Chloride: 102 mmol/L (ref 98–111)
Creatinine: 0.82 mg/dL (ref 0.44–1.00)
GFR, Estimated: >60 mL/min (ref >60)
Glucose, Bld: 316 mg/dL — ABNORMAL HIGH (ref 70–99)
Potassium: 4.4 mmol/L (ref 3.5–5.1)
Sodium: 138 mmol/L (ref 135–145)
Total Bilirubin: 0.7 mg/dL (ref 0.3–1.2)
Total Protein: 6.4 g/dL — ABNORMAL LOW (ref 6.5–8.1)

## 2021-01-15 LAB — CBC WITH DIFFERENTIAL/PLATELET
Abs Immature Granulocytes: 0.05 10*3/uL (ref 0.00–0.07)
Basophils Absolute: 0.1 10*3/uL (ref 0.0–0.1)
Basophils Relative: 1 %
Eosinophils Absolute: 0.1 10*3/uL (ref 0.0–0.5)
Eosinophils Relative: 1 %
HCT: 38.3 % (ref 36.0–46.0)
Hemoglobin: 12.1 g/dL (ref 12.0–15.0)
Immature Granulocytes: 0 %
Lymphocytes Relative: 61 %
Lymphs Abs: 8 10*3/uL — ABNORMAL HIGH (ref 0.7–4.0)
MCH: 27.8 pg (ref 26.0–34.0)
MCHC: 31.6 g/dL (ref 30.0–36.0)
MCV: 87.8 fL (ref 80.0–100.0)
Monocytes Absolute: 0.7 10*3/uL (ref 0.1–1.0)
Monocytes Relative: 5 %
Neutro Abs: 4.2 10*3/uL (ref 1.7–7.7)
Neutrophils Relative %: 32 %
Platelets: 90 10*3/uL — ABNORMAL LOW (ref 150–400)
RBC: 4.36 MIL/uL (ref 3.87–5.11)
RDW: 14.3 % (ref 11.5–15.5)
WBC: 13.1 10*3/uL — ABNORMAL HIGH (ref 4.0–10.5)
nRBC: 0 % (ref 0.0–0.2)

## 2021-01-15 LAB — LACTATE DEHYDROGENASE: LDH: 191 U/L (ref 98–192)

## 2021-01-18 ENCOUNTER — Telehealth: Payer: Self-pay | Admitting: Adult Health

## 2021-01-18 NOTE — Progress Notes (Signed)
Evusheld information mailed to patient as requested.

## 2021-01-18 NOTE — Telephone Encounter (Signed)
I spoke with [name] today for about 15 minutes regarding Evusheld for COVID 19 prevention.  She is going to look into the cost and will call me back if she would like to proceed with scheduling.  She asked that I mail her a patient fact sheet and I sent the request to my nurse.    Wilber Bihari, NP

## 2021-01-21 ENCOUNTER — Ambulatory Visit: Payer: Medicare HMO | Admitting: Hematology

## 2021-01-21 ENCOUNTER — Other Ambulatory Visit: Payer: Medicare HMO

## 2021-01-28 ENCOUNTER — Other Ambulatory Visit: Payer: Medicare HMO

## 2021-01-28 ENCOUNTER — Ambulatory Visit: Payer: Medicare HMO | Admitting: Hematology

## 2021-02-03 ENCOUNTER — Ambulatory Visit: Payer: HMO

## 2021-02-03 ENCOUNTER — Ambulatory Visit: Payer: Self-pay

## 2021-02-03 ENCOUNTER — Other Ambulatory Visit: Payer: Self-pay

## 2021-02-03 DIAGNOSIS — I351 Nonrheumatic aortic (valve) insufficiency: Secondary | ICD-10-CM | POA: Diagnosis not present

## 2021-02-03 DIAGNOSIS — I6522 Occlusion and stenosis of left carotid artery: Secondary | ICD-10-CM

## 2021-02-03 DIAGNOSIS — C8300 Small cell B-cell lymphoma, unspecified site: Secondary | ICD-10-CM | POA: Diagnosis not present

## 2021-02-03 DIAGNOSIS — K760 Fatty (change of) liver, not elsewhere classified: Secondary | ICD-10-CM | POA: Diagnosis not present

## 2021-02-03 DIAGNOSIS — R0989 Other specified symptoms and signs involving the circulatory and respiratory systems: Secondary | ICD-10-CM

## 2021-02-03 DIAGNOSIS — K7460 Unspecified cirrhosis of liver: Secondary | ICD-10-CM | POA: Diagnosis not present

## 2021-02-03 DIAGNOSIS — F329 Major depressive disorder, single episode, unspecified: Secondary | ICD-10-CM | POA: Diagnosis not present

## 2021-02-03 DIAGNOSIS — E119 Type 2 diabetes mellitus without complications: Secondary | ICD-10-CM | POA: Diagnosis not present

## 2021-02-03 DIAGNOSIS — G629 Polyneuropathy, unspecified: Secondary | ICD-10-CM | POA: Diagnosis not present

## 2021-02-03 DIAGNOSIS — R188 Other ascites: Secondary | ICD-10-CM | POA: Diagnosis not present

## 2021-02-03 DIAGNOSIS — R221 Localized swelling, mass and lump, neck: Secondary | ICD-10-CM | POA: Diagnosis not present

## 2021-02-03 DIAGNOSIS — I10 Essential (primary) hypertension: Secondary | ICD-10-CM | POA: Diagnosis not present

## 2021-02-04 NOTE — Progress Notes (Signed)
FYI, seeing you on 6/27.

## 2021-02-08 NOTE — Progress Notes (Signed)
Carotid artery duplex 39/79/5369: Peak systolic velocities in the right bifurcation, internal, external and common carotid arteries are within normal limits. 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 Stenosis in the left external carotid artery (<50%). Antegrade right vertebral artery flow. Antegrade left vertebral artery flow. Compared to 01/29/2020, left ICA stenosis was in the 50-69% range. Follow up in one year is appropriate if clinically indicated.

## 2021-02-28 ENCOUNTER — Other Ambulatory Visit: Payer: Self-pay | Admitting: Physician Assistant

## 2021-03-04 ENCOUNTER — Other Ambulatory Visit: Payer: Self-pay | Admitting: Internal Medicine

## 2021-03-04 DIAGNOSIS — R221 Localized swelling, mass and lump, neck: Secondary | ICD-10-CM

## 2021-03-07 NOTE — Progress Notes (Signed)
Primary Physician/Referring:  Chilton Greathouse, MD  Patient ID: Janice [name], female    DOB: 18-Dec-1950, 70 y.o.   MRN: [id number]  Chief Complaint  Patient presents with   otherModerate aortic regurgitation   Follow-up   Results   HPI:    Jensine Luz  is a 70 y.o. female with hypertension, type 2 DM, moderate AI, carotid artery stenosis, liver cirrhosis, chronic lymphocytic lymphoma. History of significant anemia, which lead to discontinuation of aspirin. Patient reported intolerance to high dose Crestor in the past.   Patient presents for annual follow up. At last visit increased Crestor to 10 mg daily which she is tolerating without issue. Patient reports that her husband passed away in 2022/02/22and in the few months leading up to his passing as well as in the time since she has not been taking good care of herself. Patient admits to gaining weight, high salt intake, and eating out or frozen foods frequently. She has also not been monitoring her blood pressure on a regular basis.   Patient is asymptomatic from cardiovascular standpoint, although her physical activity is limited due to left knee pain. She does walk around the block daily and uses a stationary bike some. She continues to follow closely with oncology and gastroenterology. She is scheduled for annual physical with PCP in September 2022.   Past Medical History:  Diagnosis Date   Allergy    Asthma    seasonal   Cellulitis 2013   Left toe   Cirrhosis (HCC)    Common migraine    History of   Complication of anesthesia    Per pt/had breathing problems with "block" during rotator cuff surgery. 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   DDD (degenerative disc disease)    Depression    denies takes paxil for migraines   Diabetes mellitus    type 2   Diabetic peripheral neuropathy (HCC)    Diverticulitis    Diverticulosis    DJD (degenerative joint disease)    GERD (gastroesophageal reflux disease)     History of colon polyps    hyperplastic   History of gastric polyp    Hyperlipidemia    Hypertension    Iron deficiency anemia    Obesity    OSA on CPAP    cpap   Pneumonia    april 2020  mild   Primary localized osteoarthritis of left knee 03/27/2019   Sensorineural hearing loss    Past Surgical History:  Procedure Laterality Date   ANKLE SURGERY     Left    COLONOSCOPY  05/2018   LEEP N/A 09/14/2018   Procedure: LOOP ELECTROSURGICAL EXCISION PROCEDURE (LEEP);  Surgeon: Richardean Chimera, MD;  Location: [facility];  Service: Gynecology;  Laterality: N/A;   ROTATOR CUFF REPAIR Bilateral 2012, 2015   TONSILLECTOMY     TOTAL KNEE ARTHROPLASTY Left 04/08/2019   Procedure: TOTAL KNEE ARTHROPLASTY;  Surgeon: Salvatore Marvel, MD;  Location: [facility];  Service: Orthopedics;  Laterality: Left;   UPPER GI ENDOSCOPY  05/2018   Family History  Problem Relation Age of Onset   Breast cancer Mother    Bone cancer Mother    Heart failure Father    Colon cancer Neg Hx    Rectal cancer Neg Hx    Stomach cancer Neg Hx    Esophageal cancer Neg Hx    Pancreatic cancer Neg Hx    Liver disease Neg Hx     Social History  Tobacco Use   Smoking status: Never   Smokeless tobacco: Never  Substance Use Topics   Alcohol use: Not Currently    Comment: very rare   Marital Status: Widowed   ROS  Review of Systems  Constitutional: Positive for weight gain.  Cardiovascular:  Negative for chest pain, claudication, leg swelling, near-syncope, orthopnea, palpitations, paroxysmal nocturnal dyspnea and syncope.  Respiratory:  Negative for shortness of breath.   Musculoskeletal:  Positive for joint pain (left knee).  Neurological:  Negative for dizziness.   Objective  Blood pressure 140/60, pulse 74, temperature 98.3 F (36.8 C), height 5' 4.5" (1.638 m), weight 219 lb (99.3 kg), SpO2 96 %.  Vitals with BMI 03/08/2021 03/08/2021 01/15/2021  Height - 5' 4.5" 5' 4.5"  Weight - 219 lbs 218 lbs 5  oz  BMI - 53.66 44.03  Systolic 474 259 563  Diastolic 60 43 48  Pulse - 74 72      Physical Exam Vitals reviewed.  Constitutional:      Appearance: She is obese.  Cardiovascular:     Rate and Rhythm: Normal rate and regular rhythm.     Pulses: Intact distal pulses.          Carotid pulses are  on the left side with bruit.    Heart sounds: S1 normal and S2 normal. No murmur heard.   No gallop.  Pulmonary:     Effort: Pulmonary effort is normal. No respiratory distress.     Breath sounds: No wheezing, rhonchi or rales.  Musculoskeletal:     Right lower leg: No edema.     Left lower leg: No edema.  Neurological:     Mental Status: She is alert.    Laboratory examination:   Recent Labs    04/08/20 1435 07/01/20 1039 07/21/20 1340 08/20/20 0844 01/15/21 0901  NA 142   < > 138 140 138  K 4.2   < > 4.4 4.9 4.4  CL 106   < > 103 105 102  CO2 27   < > _0 GLUCOSE 106*   < > 91 195* 316*  BUN 9   < > _1 CREATININE 0.83   < > 0.89 0.83 0.82  CALCIUM 10.3   < > 9.1 9.4 8.9  GFRNONAA >60  --   --  >60 >60  GFRAA >60  --   --   --   --    < > = values in this interval not displayed.   CrCl cannot be calculated (Patient's most recent lab result is older than the maximum 21 days allowed.).  CMP Latest Ref Rng & Units 01/15/2021 08/20/2020 07/21/2020  Glucose 70 - 99 mg/dL 316(H) 195(H) 91  BUN 8 - 23 mg/dL _2 Creatinine 0.44 - 1.00 mg/dL 0.82 0.83 0.89  Sodium 135 - 145 mmol/L 138 140 138  Potassium 3.5 - 5.1 mmol/L 4.4 4.9 4.4  Chloride 98 - 111 mmol/L 102 105 103  CO2 22 - 32 mmol/L _3 Calcium 8.9 - 10.3 mg/dL 8.9 9.4 9.1  Total Protein 6.5 - 8.1 g/dL 6.4(L) 6.5 -  Total Bilirubin 0.3 - 1.2 mg/dL 0.7 0.6 -  Alkaline Phos 38 - 126 U/L 75 73 -  AST 15 - 41 U/L 17 20 -  ALT 0 - 44 U/L 21 25 -   CBC Latest Ref Rng & Units 01/15/2021 08/20/2020 08/03/2020  WBC 4.0 - 10.5 K/uL 13.1(H) 9.9  10.2  Hemoglobin 12.0 - 15.0 g/dL 12.1 11.8(L) 12.5   Hematocrit 36.0 - 46.0 % 38.3 37.3 37.7  Platelets 150 - 400 K/uL 90(L) 84(L) 87.0(L)    Lipid Panel No results for input(s): CHOL, TRIG, LDLCALC, VLDL, HDL, CHOLHDL, LDLDIRECT in the last 8760 hours.  HEMOGLOBIN A1C Lab Results  Component Value Date   HGBA1C 6.9 (H) 03/29/2019   MPG 151.33 03/29/2019   TSH No results for input(s): TSH in the last 8760 hours.  External labs:  05/13/2020: Glucose 71, BUN 11, creatinine 0.7, GFR >60, sodium 142, potassium 4.6, AST 22, ALT 34, alk phos 90 Hemoglobin 12.4, hematocrit 38.4, MCV 84.1, platelet 83 Total cholesterol 120, triglycerides 86, HDL 36, LDL 67, lipoprotein B 59 TSH 2.03 A1c 6.2%  12/31/2019: Glucose BUN/Cr 10/0.8. eGFR 71 HbA1C 6.7%   09/2018: Chol 151, TG 164, HDL 33, LDL 85 TSH 1.9 normal  Allergies   Allergies  Allergen Reactions   Benzonatate Anaphylaxis and Hives   Fluticasone Furoate-Vilanterol Hives and Anaphylaxis   Iodine Hives, Swelling and Other (See Comments)    And Betadine/caused hives and swelling   Latex Hives, Itching, Rash and Other (See Comments)    Burning   Simvastatin Other (See Comments)    Caused neuropathy in arms Neuropathy in arms   Bupropion Other (See Comments)    Negative thoughts. Crazy thoughts   Levofloxacin Other (See Comments)    Joint aches//leg, shoulder pain Joint pain   Penicillins Hives   Povidone-Iodine Hives   Promethazine Nausea Only and Other (See Comments)    Doesn't work   Amoxicillin Hives    Blisters Did it involve swelling of the face/tongue/throat, SOB, or low BP? Yes Did it involve sudden or severe rash/hives, skin peeling, or any reaction on the inside of your mouth or nose? Yes Did you need to seek medical attention at a hospital or doctor's office?Unknown When did it last happen? 2019      If all above answers are "NO", may proceed with cephalosporin use.    Chlorthalidone Other (See Comments)    Ears ringing    Diflucan [Fluconazole] Hives and  Other (See Comments)    blister   Amoxicillin-Pot Clavulanate Rash   Doxycycline Itching and Diarrhea      Medications Prior to Visit:   Outpatient Medications Prior to Visit  Medication Sig Dispense Refill   albuterol (PROVENTIL HFA;VENTOLIN HFA) 108 (90 Base) MCG/ACT inhaler Inhale 1-2 puffs into the lungs every 6 (six) hours as needed for wheezing or shortness of breath.     amLODipine (NORVASC) 10 MG tablet Take 1 tablet by mouth daily.     aspirin EC 81 MG tablet Take 1 tablet (81 mg total) by mouth daily. Swallow whole. 90 tablet 3   Carboxymethylcellul-Glycerin (REFRESH OPTIVE PF OP) Apply 1 drop to eye 3 (three) times daily as needed (dry eyes).     cetirizine (ZYRTEC) 10 MG tablet Take 10 mg by mouth daily.     desoximetasone (TOPICORT) 0.25 % cream Apply 1 application topically 4 (four) times daily as needed (rash (applied to left ear daily)).      diclofenac sodium (VOLTAREN) 1 % GEL Apply 2 g topically 2 (two) times daily as needed (knee pain).   2   Flaxseed, Linseed, OIL Take 1,000 mg by mouth daily.      fluticasone (FLONASE) 50 MCG/ACT nasal spray Place 2 sprays into both nostrils daily.     gabapentin (NEURONTIN) 300 MG capsule Take 600  mg by mouth daily with supper.      insulin NPH Human (HUMULIN N,NOVOLIN N) 100 UNIT/ML injection Inject 40-75 Units into the skin See admin instructions. Inject 75 units subcutaneously at breakfast, 40 units at lunch & 65 units at supper     insulin regular (NOVOLIN R,HUMULIN R) 100 units/mL injection Inject 30-45 Units into the skin See admin instructions. Inject 30 units subcutaneously in the morning, 30 units at lunch & 30 units at supper     irbesartan (AVAPRO) 300 MG tablet Take 1 tablet by mouth daily.     metFORMIN (GLUCOPHAGE-XR) 500 MG 24 hr tablet Take 1,000 mg by mouth 2 (two) times daily.     Methylcobalamin (B12-ACTIVE PO) Take 0.5 tablets by mouth at bedtime.     NAPROSYN 500 MG tablet Take 500 mg by mouth as needed.      omeprazole (PRILOSEC) 40 MG capsule Take 40 mg by mouth 2 (two) times daily.     PARoxetine (PAXIL) 20 MG tablet Take 10 mg by mouth daily.      spironolactone (ALDACTONE) 50 MG tablet Take 1 tablet by mouth once daily 30 tablet 0   SYMBICORT 160-4.5 MCG/ACT inhaler 2 puffs daily as needed.     terconazole (TERAZOL 3) 0.8 % vaginal cream Place 1 applicator vaginally as needed (yeast infections).      vitamin C (ASCORBIC ACID) 500 MG tablet Take 500 mg by mouth 2 (two) times a day.      rosuvastatin (CRESTOR) 10 MG tablet Take 1 tablet by mouth once daily 180 tablet 0   FLUZONE HIGH-DOSE QUADRIVALENT 0.7 ML SUSY      No facility-administered medications prior to visit.     Final Medications at End of Visit    Current Meds  Medication Sig   albuterol (PROVENTIL HFA;VENTOLIN HFA) 108 (90 Base) MCG/ACT inhaler Inhale 1-2 puffs into the lungs every 6 (six) hours as needed for wheezing or shortness of breath.   amLODipine (NORVASC) 10 MG tablet Take 1 tablet by mouth daily.   aspirin EC 81 MG tablet Take 1 tablet (81 mg total) by mouth daily. Swallow whole.   Carboxymethylcellul-Glycerin (REFRESH OPTIVE PF OP) Apply 1 drop to eye 3 (three) times daily as needed (dry eyes).   cetirizine (ZYRTEC) 10 MG tablet Take 10 mg by mouth daily.   desoximetasone (TOPICORT) 0.25 % cream Apply 1 application topically 4 (four) times daily as needed (rash (applied to left ear daily)).    diclofenac sodium (VOLTAREN) 1 % GEL Apply 2 g topically 2 (two) times daily as needed (knee pain).    Flaxseed, Linseed, OIL Take 1,000 mg by mouth daily.    fluticasone (FLONASE) 50 MCG/ACT nasal spray Place 2 sprays into both nostrils daily.   gabapentin (NEURONTIN) 300 MG capsule Take 600 mg by mouth daily with supper.    insulin NPH Human (HUMULIN N,NOVOLIN N) 100 UNIT/ML injection Inject 40-75 Units into the skin See admin instructions. Inject 75 units subcutaneously at breakfast, 40 units at lunch & 65 units at supper    insulin regular (NOVOLIN R,HUMULIN R) 100 units/mL injection Inject 30-45 Units into the skin See admin instructions. Inject 30 units subcutaneously in the morning, 30 units at lunch & 30 units at supper   irbesartan (AVAPRO) 300 MG tablet Take 1 tablet by mouth daily.   metFORMIN (GLUCOPHAGE-XR) 500 MG 24 hr tablet Take 1,000 mg by mouth 2 (two) times daily.   Methylcobalamin (B12-ACTIVE PO) Take 0.5 tablets by  mouth at bedtime.   NAPROSYN 500 MG tablet Take 500 mg by mouth as needed.   omeprazole (PRILOSEC) 40 MG capsule Take 40 mg by mouth 2 (two) times daily.   PARoxetine (PAXIL) 20 MG tablet Take 10 mg by mouth daily.    spironolactone (ALDACTONE) 50 MG tablet Take 1 tablet by mouth once daily   SYMBICORT 160-4.5 MCG/ACT inhaler 2 puffs daily as needed.   terconazole (TERAZOL 3) 0.8 % vaginal cream Place 1 applicator vaginally as needed (yeast infections).    vitamin C (ASCORBIC ACID) 500 MG tablet Take 500 mg by mouth 2 (two) times a day.    [DISCONTINUED] rosuvastatin (CRESTOR) 10 MG tablet Take 1 tablet by mouth once daily     Radiology:   No results found.  Cardiac Studies:   Carotid artery duplex 16/06/9603: Peak systolic velocities in the right bifurcation, internal, external andcommon carotid arteries are within normal limits. 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 Stenosis in the leftexternal carotid artery (<50%). Antegrade right vertebral artery flow. Antegrade left vertebral artery flow. Compared to 01/29/2020, left ICA stenosis was in the 50-69% range. Follow up inone year is appropriate if clinically indicated.   Echocardiogram 02/03/2021:  Normal LV systolic function with visual EF 60-65%. Left ventricle cavity  is normal in size. Mild to moderate left ventricular hypertrophy. Normal  global wall motion. Doppler evidence of grade II (pseudonormal) diastolic dysfunction, elevated LAP.  Left atrial cavity is mildly dilated.  Mild (Grade I) aortic  regurgitation.  Mild (Grade I) mitral regurgitation.  Mild tricuspid regurgitation. No evidence of pulmonary hypertension.  Compared to study dated 02/27/2019: G1DD is now G2DD, elevated LAP is new, moderate AR is now mild, otherwise no significant change.   H[facility]ricks Stress Test 02/18/2019: Stress EKG is non-diagnostic, as this is pharmacological stress test. Normal myocardial perfusion. Stress LV EF: 50%. Low risk stress test.  EKG:   03/08/2021: Sinus rhythm at a rate of 76 bpm.  Normal axis.  Poor R wave progression, cannot exclude anteroseptal infarct old.  No evidence of ischemia or underlying injury pattern.  EKG 03/09/2020: Sinus rhythm 76 bpm Normal EKG  Assessment     ICD-10-CM   1. Essential hypertension  I10 EKG 12-Lead    2. Asymptomatic stenosis of left carotid artery  I65.22 EKG 12-Lead    3. Mild aortic regurgitation  I35.1        Medications Discontinued During This Encounter  Medication Reason   rosuvastatin (CRESTOR) 10 MG tablet Reorder    Meds ordered this encounter  Medications   rosuvastatin (CRESTOR) 10 MG tablet    Sig: Take 1 tablet (10 mg total) by mouth daily.    Dispense:  90 tablet    Refill:  3    Recommendations:   Kendalyn Cranfield is a 70 y.o. female with hypertension, type 2 DM, moderate AI, carotid artery stenosis. History of significant anemia, which lead to discontinuation of aspirin. Patient reported intolerance to high dose Crestor in the past.   Patient presents for annual follow up. At last visit increased Crestor to 10 mg daily. I personally reviewed external labs, lipids were under excellent control. She will have repeat lipid profile testing done in September with PCP, have requested that patient have these records sent to our office. In regard to hypertension, blood pressure remains elevated above goal. Discussed with patient option of additional antihypertensive medication vs strict diet changes, salt restriction, and  weight loss. Patient prefers to focus  on diet and lifestyle modifications first.   Reviewed and discussed with patient echocardiogram and carotid duplex, details above. Echocardiogram revealed increased diastolic dysfunction to grade II. Recommend aggressive weight management and blood pressure control. There is no clinical evidence of heart failure at this time. Left carotid stenosis has improved compared to previous. Will continue annual surveillance. No changes were made to her medications today.   Follow up in 6 months, sooner if needed, for hypertension and carotid artery stenosis.    Alethia Berthold, PA-C 03/08/2021, 5:03 PM Office: [phone]

## 2021-03-08 ENCOUNTER — Encounter: Payer: Self-pay | Admitting: Student

## 2021-03-08 ENCOUNTER — Other Ambulatory Visit: Payer: Self-pay

## 2021-03-08 ENCOUNTER — Ambulatory Visit: Payer: HMO | Admitting: Student

## 2021-03-08 VITALS — BP 140/60 | HR 74 | Temp 98.3°F | Ht 64.5 in | Wt 219.0 lb

## 2021-03-08 DIAGNOSIS — I351 Nonrheumatic aortic (valve) insufficiency: Secondary | ICD-10-CM | POA: Diagnosis not present

## 2021-03-08 DIAGNOSIS — I6522 Occlusion and stenosis of left carotid artery: Secondary | ICD-10-CM

## 2021-03-08 DIAGNOSIS — I10 Essential (primary) hypertension: Secondary | ICD-10-CM

## 2021-03-08 MED ORDER — ROSUVASTATIN CALCIUM 10 MG PO TABS: 10.0000 mg | ORAL_TABLET | Freq: Every day | ORAL | 3 refills | Status: DC

## 2021-03-08 NOTE — Patient Instructions (Signed)
https://www.nhlbi.nih.gov/files/docs/public/heart/dash_brief.pdf">  DASH Eating Plan DASH stands for Dietary Approaches to Stop Hypertension. The DASH eating plan is a healthy eating plan that has been shown to: Reduce high blood pressure (hypertension). Reduce your risk for type 2 diabetes, heart disease, and stroke. Help with weight loss. What are tips for following this plan? Reading food labels Check food labels for the amount of salt (sodium) per serving. Choose foods with less than 5 percent of the Daily Value of sodium. Generally, foods with less than 300 milligrams (mg) of sodium per serving fit into this eating plan. To find whole grains, look for the word "whole" as the first word in the ingredient list. Shopping Buy products labeled as "low-sodium" or "no salt added." Buy fresh foods. Avoid canned foods and pre-made or frozen meals. Cooking Avoid adding salt when cooking. Use salt-free seasonings or herbs instead of table salt or sea salt. Check with your health care provider or pharmacist before using salt substitutes. Do not fry foods. Cook foods using healthy methods such as baking, boiling, grilling, roasting, and broiling instead. Cook with heart-healthy oils, such as olive, canola, avocado, soybean, or sunflower oil. Meal planning  Eat a balanced diet that includes: 4 or more servings of fruits and 4 or more servings of vegetables each day. Try to fill one-half of your plate with fruits and vegetables. 6-8 servings of whole grains each day. Less than 6 oz (170 g) of lean meat, poultry, or fish each day. A 3-oz (85-g) serving of meat is about the same size as a deck of cards. One egg equals 1 oz (28 g). 2-3 servings of low-fat dairy each day. One serving is 1 cup (237 mL). 1 serving of nuts, seeds, or beans 5 times each week. 2-3 servings of heart-healthy fats. Healthy fats called omega-3 fatty acids are found in foods such as walnuts, flaxseeds, fortified milks, and eggs.  These fats are also found in cold-water fish, such as sardines, salmon, and mackerel. Limit how much you eat of: Canned or prepackaged foods. Food that is high in trans fat, such as some fried foods. Food that is high in saturated fat, such as fatty meat. Desserts and other sweets, sugary drinks, and other foods with added sugar. Full-fat dairy products. Do not salt foods before eating. Do not eat more than 4 egg yolks a week. Try to eat at least 2 vegetarian meals a week. Eat more home-cooked food and less restaurant, buffet, and fast food.  Lifestyle When eating at a restaurant, ask that your food be prepared with less salt or no salt, if possible. If you drink alcohol: Limit how much you use to: 0-1 drink a day for women who are not pregnant. 0-2 drinks a day for men. Be aware of how much alcohol is in your drink. In the [place] one drink equals one 12 oz bottle of beer (355 mL), one 5 oz glass of wine (148 mL), or one 1 oz glass of hard liquor (44 mL). General information Avoid eating more than 2,300 mg of salt a day. If you have hypertension, you may need to reduce your sodium intake to 1,500 mg a day. Work with your health care provider to maintain a healthy body weight or to lose weight. Ask what an ideal weight is for you. Get at least 30 minutes of exercise that causes your heart to beat faster (aerobic exercise) most days of the week. Activities may include walking, swimming, or biking. Work with your health care provider   or dietitian to adjust your eating plan to your individual calorie needs. What foods should I eat? Fruits All fresh, dried, or frozen fruit. Canned fruit in natural juice (without addedsugar). Vegetables Fresh or frozen vegetables (raw, steamed, roasted, or grilled). Low-sodium or reduced-sodium tomato and vegetable juice. Low-sodium or reduced-sodium tomatosauce and tomato paste. Low-sodium or reduced-sodium canned vegetables. Grains Whole-grain or  whole-wheat bread. Whole-grain or whole-wheat pasta. Brown rice. Oatmeal. Quinoa. Bulgur. Whole-grain and low-sodium cereals. Pita bread.Low-fat, low-sodium crackers. Whole-wheat flour tortillas. Meats and other proteins Skinless chicken or turkey. Ground chicken or turkey. Pork with fat trimmed off. Fish and seafood. Egg whites. Dried beans, peas, or lentils. Unsalted nuts, nut butters, and seeds. Unsalted canned beans. Lean cuts of beef with fat trimmed off. Low-sodium, lean precooked or cured meat, such as sausages or meatloaves. Dairy Low-fat (1%) or fat-free (skim) milk. Reduced-fat, low-fat, or fat-free cheeses. Nonfat, low-sodium ricotta or cottage cheese. Low-fat or nonfatyogurt. Low-fat, low-sodium cheese. Fats and oils Soft margarine without trans fats. Vegetable oil. Reduced-fat, low-fat, or light mayonnaise and salad dressings (reduced-sodium). Canola, safflower, olive, avocado, soybean, andsunflower oils. Avocado. Seasonings and condiments Herbs. Spices. Seasoning mixes without salt. Other foods Unsalted popcorn and pretzels. Fat-free sweets. The items listed above may not be a complete list of foods and beverages you can eat. Contact a dietitian for more information. What foods should I avoid? Fruits Canned fruit in a light or heavy syrup. Fried fruit. Fruit in cream or buttersauce. Vegetables Creamed or fried vegetables. Vegetables in a cheese sauce. Regular canned vegetables (not low-sodium or reduced-sodium). Regular canned tomato sauce and paste (not low-sodium or reduced-sodium). Regular tomato and vegetable juice(not low-sodium or reduced-sodium). Pickles. Olives. Grains Baked goods made with fat, such as croissants, muffins, or some breads. Drypasta or rice meal packs. Meats and other proteins Fatty cuts of meat. Ribs. Fried meat. Bacon. Bologna, salami, and other precooked or cured meats, such as sausages or meat loaves. Fat from the back of a pig (fatback). Bratwurst.  Salted nuts and seeds. Canned beans with added salt. Canned orsmoked fish. Whole eggs or egg yolks. Chicken or turkey with skin. Dairy Whole or 2% milk, cream, and half-and-half. Whole or full-fat cream cheese. Whole-fat or sweetened yogurt. Full-fat cheese. Nondairy creamers. Whippedtoppings. Processed cheese and cheese spreads. Fats and oils Butter. Stick margarine. Lard. Shortening. Ghee. Bacon fat. Tropical oils, suchas coconut, palm kernel, or palm oil. Seasonings and condiments Onion salt, garlic salt, seasoned salt, table salt, and sea salt. Worcestershire sauce. Tartar sauce. Barbecue sauce. Teriyaki sauce. Soy sauce, including reduced-sodium. Steak sauce. Canned and packaged gravies. Fish sauce. Oyster sauce. Cocktail sauce. Store-bought horseradish. Ketchup. Mustard. Meat flavorings and tenderizers. Bouillon cubes. Hot sauces. Pre-made or packaged marinades. Pre-made or packaged taco seasonings. Relishes. Regular saladdressings. Other foods Salted popcorn and pretzels. The items listed above may not be a complete list of foods and beverages you should avoid. Contact a dietitian for more information. Where to find more information National Heart, Lung, and Blood Institute: www.nhlbi.nih.gov American Heart Association: www.heart.org Academy of Nutrition and Dietetics: www.eatright.org National Kidney Foundation: www.kidney.org Summary The DASH eating plan is a healthy eating plan that has been shown to reduce high blood pressure (hypertension). It may also reduce your risk for type 2 diabetes, heart disease, and stroke. When on the DASH eating plan, aim to eat more fresh fruits and vegetables, whole grains, lean proteins, low-fat dairy, and heart-healthy fats. With the DASH eating plan, you should limit salt (sodium) intake to 2,300   mg a day. If you have hypertension, you may need to reduce your sodium intake to 1,500 mg a day. Work with your health care provider or dietitian to adjust  your eating plan to your individual calorie needs. This information is not intended to replace advice given to you by your health care provider. Make sure you discuss any questions you have with your healthcare provider. Document Revised: 08/02/2019 Document Reviewed: 08/02/2019 Elsevier Patient Education  2022 [employer]  

## 2021-03-12 ENCOUNTER — Ambulatory Visit
Admission: RE | Admit: 2021-03-12 | Discharge: 2021-03-12 | Disposition: A | Discharge status: Home / Self Care | Payer: HMO | Source: Ambulatory Visit | Attending: Internal Medicine | Admitting: Internal Medicine

## 2021-03-12 ENCOUNTER — Telehealth: Payer: Self-pay

## 2021-03-12 DIAGNOSIS — E041 Nontoxic single thyroid nodule: Secondary | ICD-10-CM | POA: Diagnosis not present

## 2021-03-12 DIAGNOSIS — R221 Localized swelling, mass and lump, neck: Secondary | ICD-10-CM

## 2021-03-12 DIAGNOSIS — Z8572 Personal history of non-Hodgkin lymphomas: Secondary | ICD-10-CM | POA: Diagnosis not present

## 2021-03-12 DIAGNOSIS — Z856 Personal history of leukemia: Secondary | ICD-10-CM | POA: Diagnosis not present

## 2021-03-12 NOTE — Telephone Encounter (Signed)
Patient returning missed call. Pt informed of request to schedule appt.  Appt scheduled for 05/06/21 at 9:30am.. Thanks

## 2021-03-12 NOTE — Telephone Encounter (Signed)
Left message for patient to return call to schedule follow up appointment with Dr Carlean Purl or an APP per Dr Carlean Purl for cirrhosis.  Will continue efforts.

## 2021-03-17 ENCOUNTER — Other Ambulatory Visit: Payer: HMO

## 2021-03-30 DIAGNOSIS — G4733 Obstructive sleep apnea (adult) (pediatric): Secondary | ICD-10-CM | POA: Diagnosis not present

## 2021-04-05 ENCOUNTER — Telehealth: Payer: Self-pay | Admitting: Physician Assistant

## 2021-04-05 ENCOUNTER — Other Ambulatory Visit: Payer: Self-pay

## 2021-04-05 MED ORDER — SPIRONOLACTONE 50 MG PO TABS: 50.0000 mg | ORAL_TABLET | Freq: Every day | ORAL | 0 refills | Status: DC

## 2021-04-05 NOTE — Telephone Encounter (Signed)
Patient calling requesting refill for med ALDACTONE 90 day supply instead of 30 day supply.. Plz advise thank you

## 2021-04-06 DIAGNOSIS — M79672 Pain in left foot: Secondary | ICD-10-CM | POA: Diagnosis not present

## 2021-04-06 DIAGNOSIS — M7989 Other specified soft tissue disorders: Secondary | ICD-10-CM | POA: Diagnosis not present

## 2021-04-09 DIAGNOSIS — Z1152 Encounter for screening for COVID-19: Secondary | ICD-10-CM | POA: Diagnosis not present

## 2021-04-09 DIAGNOSIS — R051 Acute cough: Secondary | ICD-10-CM | POA: Diagnosis not present

## 2021-04-09 DIAGNOSIS — J069 Acute upper respiratory infection, unspecified: Secondary | ICD-10-CM | POA: Diagnosis not present

## 2021-04-09 DIAGNOSIS — R5383 Other fatigue: Secondary | ICD-10-CM | POA: Diagnosis not present

## 2021-04-09 DIAGNOSIS — R0981 Nasal congestion: Secondary | ICD-10-CM | POA: Diagnosis not present

## 2021-04-09 DIAGNOSIS — I10 Essential (primary) hypertension: Secondary | ICD-10-CM | POA: Diagnosis not present

## 2021-04-09 DIAGNOSIS — J029 Acute pharyngitis, unspecified: Secondary | ICD-10-CM | POA: Diagnosis not present

## 2021-04-19 ENCOUNTER — Encounter: Payer: Self-pay | Admitting: Student

## 2021-04-20 ENCOUNTER — Telehealth: Payer: Self-pay | Admitting: Family Medicine

## 2021-04-20 NOTE — Telephone Encounter (Signed)
Pt called has not been able to wear CPAP mask, had a terrible respiratory infection, her PCP is not sure what to do. Sending her to a lung specialist but she wont see them until 05/19/2021. She states she is exhausted cause she can't wear the CPAP mask due to coughing really bad at night. Wants to know if doctor had any suggestions on what she should do. Pt is requesting a call back.

## 2021-04-20 NOTE — Telephone Encounter (Signed)
Called the patient back. She has been battling with sinus drainage and cough for a while now. She has tried a antibiotic and cough medications. The symptoms she describes is very similar to infection she had going on in sept 2018. Advised the patient I would encourage keeping the pulmonologist apt for them to see if there is anything she can take to help prevent this from happening again. She has already had a CXR.  Informed her sounded like she has been doing what she is suppose to. Advised her to try using CPAP as she can tolerate and clean after each use. Pt verbalized understanding. She will call us if she has any other problems.

## 2021-04-22 DIAGNOSIS — G4733 Obstructive sleep apnea (adult) (pediatric): Secondary | ICD-10-CM | POA: Diagnosis not present

## 2021-04-22 DIAGNOSIS — J0181 Other acute recurrent sinusitis: Secondary | ICD-10-CM | POA: Diagnosis not present

## 2021-04-22 DIAGNOSIS — J45909 Unspecified asthma, uncomplicated: Secondary | ICD-10-CM | POA: Diagnosis not present

## 2021-04-22 DIAGNOSIS — J4541 Moderate persistent asthma with (acute) exacerbation: Secondary | ICD-10-CM | POA: Diagnosis not present

## 2021-04-22 DIAGNOSIS — K219 Gastro-esophageal reflux disease without esophagitis: Secondary | ICD-10-CM | POA: Diagnosis not present

## 2021-04-29 ENCOUNTER — Other Ambulatory Visit: Payer: Self-pay

## 2021-04-29 ENCOUNTER — Telehealth: Payer: Self-pay | Admitting: Internal Medicine

## 2021-04-29 ENCOUNTER — Encounter: Payer: Self-pay | Admitting: Internal Medicine

## 2021-04-29 ENCOUNTER — Ambulatory Visit: Payer: HMO | Admitting: Internal Medicine

## 2021-04-29 DIAGNOSIS — R058 Other specified cough: Secondary | ICD-10-CM | POA: Diagnosis not present

## 2021-04-29 DIAGNOSIS — J45991 Cough variant asthma: Secondary | ICD-10-CM

## 2021-04-29 LAB — CBC WITH DIFFERENTIAL/PLATELET
Basophils Absolute: 0.1 10*3/uL (ref 0.0–0.1)
Basophils Relative: 0.5 % (ref 0.0–3.0)
Eosinophils Absolute: 0.1 10*3/uL (ref 0.0–0.7)
Eosinophils Relative: 0.9 % (ref 0.0–5.0)
HCT: 36.5 % (ref 36.0–46.0)
Hemoglobin: 11.9 g/dL — ABNORMAL LOW (ref 12.0–15.0)
Lymphocytes Relative: 55 % — ABNORMAL HIGH (ref 12.0–46.0)
Lymphs Abs: 8.3 10*3/uL — ABNORMAL HIGH (ref 0.7–4.0)
MCHC: 32.6 g/dL (ref 30.0–36.0)
MCV: 83.7 fl (ref 78.0–100.0)
Monocytes Absolute: 0.8 10*3/uL (ref 0.1–1.0)
Monocytes Relative: 5.5 % (ref 3.0–12.0)
Neutro Abs: 5.8 10*3/uL (ref 1.4–7.7)
Neutrophils Relative %: 38.1 % — ABNORMAL LOW (ref 43.0–77.0)
Platelets: 93 10*3/uL — ABNORMAL LOW (ref 150.0–400.0)
RBC: 4.36 Mil/uL (ref 3.87–5.11)
RDW: 15.7 % — ABNORMAL HIGH (ref 11.5–15.5)
WBC: 15.1 10*3/uL — ABNORMAL HIGH (ref 4.0–10.5)

## 2021-04-29 MED ORDER — METHYLPREDNISOLONE ACETATE 80 MG/ML IJ SUSP
120.0000 mg | Freq: Once | INTRAMUSCULAR | Status: AC
  Administered 2021-04-29: 120 mg via INTRAMUSCULAR

## 2021-04-29 MED ORDER — ACETAMINOPHEN-CODEINE #3 300-30 MG PO TABS: 1.0000 | ORAL_TABLET | ORAL | 0 refills | Status: AC | PRN

## 2021-04-29 MED ORDER — AZELASTINE-FLUTICASONE 137-50 MCG/ACT NA SUSP
1.0000 | Freq: Two times a day (BID) | NASAL | 11 refills | Status: DC
Start: 2021-04-29 — End: 2021-04-29

## 2021-04-29 MED ORDER — AZELASTINE HCL 0.1 % NA SOLN: 1.0000 | Freq: Two times a day (BID) | NASAL | 3 refills | Status: DC

## 2021-04-29 MED ORDER — AZELASTINE-FLUTICASONE 137-50 MCG/ACT NA SUSP: 1.0000 | Freq: Two times a day (BID) | NASAL | 11 refills | Status: DC

## 2021-04-29 NOTE — Telephone Encounter (Signed)
Sorry to hear that > ok to change back to flonase then and give her rx for astelin and take one puff of each bid as a substitute for dysmista

## 2021-04-29 NOTE — Assessment & Plan Note (Addendum)
Onset around 1975 with tendency to "sinus infections"  - 2015 [facility] eval stop acei and use dysmista  - flared July 2022 neg resp to rx for AB >  04/29/2021 d/c breztri/flonase - cyclical cough rx 0000000 >>>   Upper airway cough syndrome (previously labeled PNDS),  is so named because it's frequently impossible to sort out how much is  CR/sinusitis with freq throat clearing (which can be related to primary GERD)   vs  causing  secondary (" extra esophageal")  GERD from wide swings in gastric pressure that occur with throat clearing, often  promoting self use of mint and menthol lozenges that reduce the lower esophageal sphincter tone and exacerbate the problem further in a cyclical fashion.   These are the same pts (now being labeled as having "irritable larynx syndrome" by some cough centers) who not infrequently have a history of having failed to tolerate ace inhibitors (as is the case here) ,  dry powder inhalers(like breo, as is the case here)  or biphosphonates or report having atypical/extraesophageal reflux symptoms that don't respond to standard doses of PPI (as is likely the case here)  and are easily confused as having aecopd or asthma flares by even experienced allergists/ pulmonologists (myself included).   Of the three most common causes of  Sub-acute / recurrent or chronic cough, only one (GERD)  can actually contribute to/ trigger  the other two (asthma and post nasal drip syndrome)  and perpetuate the cylce of cough.  While not intuitively obvious, many patients with chronic low grade reflux do not cough until there is a primary insult that disturbs the protective epithelial barrier and exposes sensitive nerve endings.   This is typically viral but can due to PNDS and  either may apply here.    >>> The point is that once this occurs, it is difficult to eliminate the cycle  using anything but a maximally effective acid suppression regimen at least in the short run, accompanied by an  appropriate diet to address non acid GERD and control / eliminate the cough itself for at least 3 days with tylenol #3 and increase gabapentin to 900 mg per day , eliminate pnds with 1st gen H1 blockers per guidelines >>> also so added  depomedrol 120 mg IM  in case of component of Th-2 driven upper or lower airways inflammation (if cough responds short term only to relapse before return while will on full rx for uacs (as above), then  that would point to allergic rhinitis/ asthma or eos bronchitis as alternative dx)   Advised: The standardized cough guidelines published in Chest by Lissa Morales in 2006 are still the best available and consist of a multiple step process (up to 12!) , not a single office visit,  and are intended  to address this problem logically,  with an alogrithm dependent on response to empiric treatment at  each progressive step  to determine a specific diagnosis with  minimal addtional testing needed. Therefore if adherence is an issue or can't be accurately verified,  it's very unlikely the standard evaluation and treatment will be successful here.    Furthermore, response to therapy (other than acute cough suppression, which should only be used short term with avoidance of narcotic containing cough syrups if possible), can be a gradual process for which the patient is not likely to  perceive immediate benefit.  Unlike going to an eye doctor where the best perscription is almost always the first one and is  immediately effective, this is almost never the case in the management of chronic cough syndromes. Therefore the patient needs to commit up front to consistently adhere to recommendations  for up to 6 weeks of therapy directed at the likely underlying problem(s) before the response can be reasonably evaluated.   >>> f/u in 4 weeks, call sooner if needed

## 2021-04-29 NOTE — Assessment & Plan Note (Addendum)
?   Onset in pt with h/o sinus problems and freq cough  Dating back at least to 2015 @ initial [facility] eval  - Allergy profile 04/29/2021 >  Eos 0. /  IgE  Pending  - - The proper method of use, as well as anticipated side effects, of a metered-dose inhaler were discussed and demonstrated to the patient using teach back method. Improved effectiveness after extensive coaching during this visit to a level of approximately 75 % from a baseline of 50 % so rx use saba prn   Not really sure she has asthma but I don't think it's the cause of her refractory cough so I'd like to eliminate cough 1st and just use saba prn sob   I spent extra time with pt today reviewing appropriate use of albuterol for prn use on exertion with the following points: 1) saba is for relief of sob that does not improve by walking a slower pace or resting but rather if the pt does not improve after trying this first. 2) If the pt is convinced, as many are, that saba helps recover from activity faster then it's easy to tell if this is the case by re-challenging : ie stop, take the inhaler, then p 5 minutes try the exact same activity (intensity of workload) that just caused the symptoms and see if they are substantially diminished or not after saba 3) if there is an activity that reproducibly causes the symptoms, try the saba 15 min before the activity on alternate days   If in fact the saba really does help, then fine to continue to use it prn but advised may need to look closer at the maintenance regimen being used to achieve better control of airways disease with exertion.           Each maintenance medication was reviewed in detail including emphasizing most importantly the difference between maintenance and prns and under what circumstances the prns are to be triggered using an action plan format where appropriate.  Total time for H and P, chart review, counseling, reviewing hfa device(s) and generating customized AVS unique to  this office visit / same day charting  > 60 min

## 2021-04-29 NOTE — Progress Notes (Signed)
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 and rhinitis rec dysmista / zyrtec referred to pulmonary clinic 04/29/2021 by Dr   Dagmar Hait for cough/wheeze with prn use of albuterol x years but rarely used it until around March 12 2021    History of Present Illness  04/29/2021  Pulmonary/ 1st office eval/Signe Tackitt  Chief Complaint  Patient presents with   Consult    Cough and wheezing for 3 weeks  Onset July 27th 2022 lots of nasal congestion/ drainage turned slt yellow while maint on flonase and clariton rx zpak > worse with cough/ wheeze rx prednisone > nebulizer  in office helped some > but still cough to point of gagging despite breztri 04/22/21 Dyspnea:  not limiting but not longer working out on ex bike Cough: minimal  Sleep: cpap / at least once a night wakes up cough  SABA use: last used 5 h  prior Finishing omnicef / no longer purulent secretions at this point  No obvious day to day or daytime variability or assoc excess/ purulent sputum or mucus plugs or hemoptysis or cp or chest tightness, subjective wheeze    Also denies any obvious fluctuation of symptoms with weather or environmental changes or other aggravating or alleviating factors except as outlined above   No unusual exposure hx or h/o childhood pna/ asthma or knowledge of premature birth.  Current Allergies, Complete Past Medical History, Past Surgical History, Family History, and Social History were reviewed in [employer] record.  ROS  The following are not active complaints unless bolded Hoarseness, sore throat, dysphagia, dental problems, itching, sneezing,  nasal congestion or discharge of excess mucus/watery   or purulent secretions, ear ache,   fever, chills, sweats, unintended wt loss or wt gain, classically pleuritic or exertional cp,  orthopnea pnd or arm/hand swelling  or leg swelling, presyncope, palpitations, abdominal pain, anorexia, nausea, vomiting, diarrhea  or change in bowel habits or change in bladder habits, change in stools or change in urine, dysuria, hematuria,  rash, arthralgias, visual complaints, headache, numbness, weakness or ataxia or problems with walking or coordination,  change in mood or  memory.           Past Medical History:  Diagnosis Date   Allergy    Asthma    seasonal   Cellulitis 2013   Left toe   Cirrhosis ([place])    Common migraine    History of   Complication of anesthesia    Per pt/had breathing problems with "block" during rotator cuff surgery. 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   DDD (degenerative disc disease)    Depression    denies takes paxil for migraines   Diabetes mellitus    type 2   Diabetic peripheral neuropathy (HCC)    Diverticulitis    Diverticulosis    DJD (degenerative joint disease)    GERD (gastroesophageal reflux disease)    History of colon polyps    hyperplastic   History of gastric polyp    Hyperlipidemia    Hypertension    Iron deficiency anemia    Obesity    OSA on CPAP    cpap   Pneumonia  april 2020  mild   Primary localized osteoarthritis of left knee 03/27/2019   Sensorineural hearing loss     Outpatient Medications Prior to Visit  Medication Sig Dispense Refill   albuterol (PROVENTIL HFA;VENTOLIN HFA) 108 (90 Base) MCG/ACT inhaler Inhale 1-2 puffs into the lungs every 6 (six) hours as needed for wheezing or shortness of breath.     amLODipine (NORVASC) 10 MG tablet Take 1 tablet by mouth daily.     aspirin EC 81 MG tablet Take 1 tablet (81 mg total) by mouth daily. Swallow whole. 90 tablet 3   Carboxymethylcellul-Glycerin (REFRESH OPTIVE PF OP) Apply 1 drop to eye 3 (three) times daily as needed (dry  eyes).     desoximetasone (TOPICORT) 0.25 % cream Apply 1 application topically 4 (four) times daily as needed (rash (applied to left ear daily)).      diclofenac sodium (VOLTAREN) 1 % GEL Apply 2 g topically 2 (two) times daily as needed (knee pain).   2   FLUZONE HIGH-DOSE QUADRIVALENT 0.7 ML SUSY      gabapentin (NEURONTIN) 300 MG capsule Take 600 mg by mouth daily with supper.      insulin NPH Human (HUMULIN N,NOVOLIN N) 100 UNIT/ML injection Inject 40-75 Units into the skin See admin instructions. Inject 75 units subcutaneously at breakfast, 40 units at lunch & 65 units at supper     insulin regular (NOVOLIN R,HUMULIN R) 100 units/mL injection Inject 30-45 Units into the skin See admin instructions. Inject 30 units subcutaneously in the morning, 30 units at lunch & 30 units at supper     irbesartan (AVAPRO) 300 MG tablet Take 1 tablet by mouth daily.     metFORMIN (GLUCOPHAGE-XR) 500 MG 24 hr tablet Take 1,000 mg by mouth 2 (two) times daily.     Methylcobalamin (B12-ACTIVE PO) Take 0.5 tablets by mouth at bedtime.     NAPROSYN 500 MG tablet Take 500 mg by mouth as needed.     omeprazole (PRILOSEC) 40 MG capsule Take 40 mg by mouth 2 (two) times daily.     PARoxetine (PAXIL) 20 MG tablet Take 10 mg by mouth daily.      rosuvastatin (CRESTOR) 10 MG tablet Take 1 tablet (10 mg total) by mouth daily. 90 tablet 3   spironolactone (ALDACTONE) 50 MG tablet Take 1 tablet (50 mg total) by mouth daily. 90 tablet 0   terconazole (TERAZOL 3) 0.8 % vaginal cream Place 1 applicator vaginally as needed (yeast infections).      vitamin C (ASCORBIC ACID) 500 MG tablet Take 500 mg by mouth 2 (two) times a day.      cetirizine (ZYRTEC) 10 MG tablet Take 10 mg by mouth daily.     Flaxseed, Linseed, OIL Take 1,000 mg by mouth daily.      fluticasone (FLONASE) 50 MCG/ACT nasal spray Place 2 sprays into both nostrils daily.     SYMBICORT 160-4.5 MCG/ACT inhaler 2 puffs daily as needed.     No  facility-administered medications prior to visit.     Objective:     BP 116/74 (BP Location: Left Arm, Cuff Size: Normal)   Pulse 71   Temp (!) 97.3 F (36.3 C) (Oral)   Ht 5' 4.5" (1.638 m)   Wt 217 lb (98.4 kg)   SpO2 98% Comment: RA  BMI 36.67 kg/m   SpO2: 98 % (RA)  Amb mod obese pleasant wf with severe coughing fits on inspiration   HEENT : pt wearing mask  not removed for exam due to covid -19 concerns.    NECK :  without JVD/Nodes/TM/ nl carotid upstrokes bilaterally   LUNGS: no acc muscle use,  Nl contour chest which is clear to A and P bilaterally without cough on insp or exp maneuvers   CV:  RRR  no s3 or murmur or increase in P2, and no edema   ABD:  soft and nontender with nl inspiratory excursion in the supine position. No bruits or organomegaly appreciated, bowel sounds nl  MS:  Nl gait/ ext warm without deformities, calf tenderness, cyanosis or clubbing No obvious joint restrictions   SKIN: warm and dry without lesions    NEURO:  alert, approp, nl sensorium with  no motor or cerebellar deficits apparent.    Cxr per pt per PCP ok in late July 2022   Labs ordered 04/29/2021  :  allergy profile           Assessment   Upper airway cough syndrome Onset around 1975 with tendency to "sinus infections"  - 2015 [facility] eval stop acei and use dysmista  - flared July 2022 neg resp to rx for AB >  04/29/2021 d/c breztri/flonase - cyclical cough rx 0000000 >>>   Upper airway cough syndrome (previously labeled PNDS),  is so named because it's frequently impossible to sort out how much is  CR/sinusitis with freq throat clearing (which can be related to primary GERD)   vs  causing  secondary (" extra esophageal")  GERD from wide swings in gastric pressure that occur with throat clearing, often  promoting self use of mint and menthol lozenges that reduce the lower esophageal sphincter tone and exacerbate the problem further in a cyclical fashion.   These are the  same pts (now being labeled as having "irritable larynx syndrome" by some cough centers) who not infrequently have a history of having failed to tolerate ace inhibitors (as is the case here) ,  dry powder inhalers(like breo, as is the case here)  or biphosphonates or report having atypical/extraesophageal reflux symptoms that don't respond to standard doses of PPI (as is likely the case here)  and are easily confused as having aecopd or asthma flares by even experienced allergists/ pulmonologists (myself included).   Of the three most common causes of  Sub-acute / recurrent or chronic cough, only one (GERD)  can actually contribute to/ trigger  the other two (asthma and post nasal drip syndrome)  and perpetuate the cylce of cough.  While not intuitively obvious, many patients with chronic low grade reflux do not cough until there is a primary insult that disturbs the protective epithelial barrier and exposes sensitive nerve endings.   This is typically viral but can due to PNDS and  either may apply here.    >>> The point is that once this occurs, it is difficult to eliminate the cycle  using anything but a maximally effective acid suppression regimen at least in the short run, accompanied by an appropriate diet to address non acid GERD and control / eliminate the cough itself for at least 3 days with tylenol #3 and increase gabapentin to 900 mg per day , eliminate pnds with 1st gen H1 blockers per guidelines >>> also so added  depomedrol 120 mg IM  in case of component of Th-2 driven upper or lower airways inflammation (if cough responds short term only to relapse before return while will on full rx for uacs (as above), then  that would point to  allergic rhinitis/ asthma or eos bronchitis as alternative dx)   Advised: The standardized cough guidelines published in Chest by Janice Morales in 2006 are still the best available and consist of a multiple step process (up to 12!) , not a single office visit,  and  are intended  to address this problem logically,  with an alogrithm dependent on response to empiric treatment at  each progressive step  to determine a specific diagnosis with  minimal addtional testing needed. Therefore if adherence is an issue or can't be accurately verified,  it's very unlikely the standard evaluation and treatment will be successful here.    Furthermore, response to therapy (other than acute cough suppression, which should only be used short term with avoidance of narcotic containing cough syrups if possible), can be a gradual process for which the patient is not likely to  perceive immediate benefit.  Unlike going to an eye doctor where the best perscription is almost always the first one and is immediately effective, this is almost never the case in the management of chronic cough syndromes. Therefore the patient needs to commit up front to consistently adhere to recommendations  for up to 6 weeks of therapy directed at the likely underlying problem(s) before the response can be reasonably evaluated.   >>> f/u in 4 weeks, call sooner if needed      Cough variant asthma ? Onset in pt with h/o sinus problems and freq cough  Dating back at least to 2015 @ initial [facility] eval  - Allergy profile 04/29/2021 >  Eos 0. /  IgE  Pending  - - The proper method of use, as well as anticipated side effects, of a metered-dose inhaler were discussed and demonstrated to the patient using teach back method. Improved effectiveness after extensive coaching during this visit to a level of approximately 75 % from a baseline of 50 % so rx use saba prn   Not really sure she has asthma but I don't think it's the cause of her refractory cough so I'd like to eliminate cough 1st and just use saba prn sob   I spent extra time with pt today reviewing appropriate use of albuterol for prn use on exertion with the following points: 1) saba is for relief of sob that does not improve by walking a slower pace  or resting but rather if the pt does not improve after trying this first. 2) If the pt is convinced, as many are, that saba helps recover from activity faster then it's easy to tell if this is the case by re-challenging : ie stop, take the inhaler, then p 5 minutes try the exact same activity (intensity of workload) that just caused the symptoms and see if they are substantially diminished or not after saba 3) if there is an activity that reproducibly causes the symptoms, try the saba 15 min before the activity on alternate days   If in fact the saba really does help, then fine to continue to use it prn but advised may need to look closer at the maintenance regimen being used to achieve better control of airways disease with exertion.           Each maintenance medication was reviewed in detail including emphasizing most importantly the difference between maintenance and prns and under what circumstances the prns are to be triggered using an action plan format where appropriate.  Total time for H and P, chart review, counseling, reviewing hfa device(s) and generating customized  AVS unique to this office visit / same day charting  > 60 min          Christinia Gully, MD 04/29/2021

## 2021-04-29 NOTE — Telephone Encounter (Signed)
Call returned to patient, confirmed DOB. Made aware of MW recommendations. Confirmed pharmacy, medication sent. Voiced understanding.   Nothing further needed at this time.

## 2021-04-29 NOTE — Addendum Note (Signed)
Addended byCoralie Keens on: 04/29/2021 10:45 AM   Modules accepted: Orders

## 2021-04-29 NOTE — Patient Instructions (Addendum)
Stop breztri  Take omeprazole 40 mg Take 30- 60 min before your first and last meals of the day   Add gabapentin 300 mg each am until the cough is gone for at least a week   GERD (REFLUX)  is an extremely common cause of respiratory symptoms just like yours , many times with no obvious heartburn at all.    It can be treated with medication, but also with lifestyle changes including elevation of the head of your bed (ideally with 6 -8inch blocks under the headboard of your bed),  Smoking cessation, avoidance of late meals, excessive alcohol, and avoid fatty foods, chocolate, peppermint, colas, red wine, and acidic juices such as orange juice.  NO MINT OR MENTHOL PRODUCTS SO NO COUGH DROPS  USE SUGARLESS CANDY INSTEAD (Jolley ranchers or Stover's or Life Savers) or even ice chips will also do - the key is to swallow to prevent all throat clearing. NO OIL BASED VITAMINS - use powdered substitutes.  Avoid fish oil when coughing.    Only use your albuterol as a rescue medication to be used if you can't catch your breath by resting or doing a relaxed purse lip breathing pattern.  - The less you use it, the better it will work when you need it. - Ok to use up to 2 puffs  every 4 hours if you must but call for immediate appointment if use goes up over your usual need - Don't leave home without it !!  (think of it like starter fluid or spare tire for your car)    Take delsym two tsp every 12 hours and supplement if needed with  Tylenol #3   up to 1-2 every 4 hours to suppress the urge to cough. Swallowing water and/or using ice chips/non mint and menthol containing candies (such as lifesavers or sugarless jolly ranchers) are also effective.  You should rest your voice and avoid activities that you know make you cough.  Once you have eliminated the cough for 3 straight days try reducing the Tylenol #3 first,  then the delsym as tolerated.    Stop clariton   Dymista (takes the place of flonas)  twice  daily should eliminate the drippy nose (if too expensive ok  For drainage / throat tickle try take CHLORPHENIRAMINE  4 mg  (Chlortab '4mg'$   at [employer] should be easiest to find in the green box)  take one every 4 hours as needed - available over the counter- may cause drowsiness so start with just a dose or two an hour before bedtime and see how you tolerate it before trying in daytime )   Please remember to go to the lab department   for your tests - we will call you with the results when they are available.     Depomedrol 120  mg IM today   Please schedule a follow up office visit in 4 weeks, sooner if needed           (

## 2021-04-29 NOTE — Telephone Encounter (Signed)
Called and spoke with patient in regards to the cost of Dymista. She states it is $200. Patient would like to know if there is something other than the Dymista to use and if she should continue to use the Flonase?  Dr. Melvyn Novas please advise.  Thanks

## 2021-04-30 LAB — IGE: IgE (Immunoglobulin E), Serum: 6 kU/L (ref <=114)

## 2021-05-03 ENCOUNTER — Encounter: Payer: Self-pay | Admitting: *Deleted

## 2021-05-06 ENCOUNTER — Encounter: Payer: Self-pay | Admitting: Internal Medicine

## 2021-05-06 ENCOUNTER — Ambulatory Visit: Payer: HMO | Admitting: Internal Medicine

## 2021-05-06 ENCOUNTER — Other Ambulatory Visit (INDEPENDENT_AMBULATORY_CARE_PROVIDER_SITE_OTHER): Payer: HMO

## 2021-05-06 VITALS — BP 116/62 | HR 71 | Ht 64.5 in | Wt 222.0 lb

## 2021-05-06 DIAGNOSIS — C8300 Small cell B-cell lymphoma, unspecified site: Secondary | ICD-10-CM | POA: Insufficient documentation

## 2021-05-06 DIAGNOSIS — R188 Other ascites: Secondary | ICD-10-CM

## 2021-05-06 DIAGNOSIS — K7581 Nonalcoholic steatohepatitis (NASH): Secondary | ICD-10-CM

## 2021-05-06 DIAGNOSIS — R161 Splenomegaly, not elsewhere classified: Secondary | ICD-10-CM | POA: Diagnosis not present

## 2021-05-06 DIAGNOSIS — R400 Somnolence: Secondary | ICD-10-CM

## 2021-05-06 DIAGNOSIS — K429 Umbilical hernia without obstruction or gangrene: Secondary | ICD-10-CM | POA: Diagnosis not present

## 2021-05-06 DIAGNOSIS — K7460 Unspecified cirrhosis of liver: Secondary | ICD-10-CM

## 2021-05-06 LAB — PROTIME-INR
INR: 1.1 ratio — ABNORMAL HIGH (ref 0.8–1.0)
Prothrombin Time: 12.5 s (ref 9.6–13.1)

## 2021-05-06 LAB — AMMONIA: Ammonia: 32 umol/L (ref 11–35)

## 2021-05-06 NOTE — Patient Instructions (Signed)
Your provider has requested that you go to the basement level for lab work before leaving today. Press "B" on the elevator. The lab is located at the first door on the left as you exit the elevator.  Due to recent changes in healthcare laws, you may see the results of your imaging and laboratory studies on MyChart before your provider has had a chance to review them.  We understand that in some cases there may be results that are confusing or concerning to you. Not all laboratory results come back in the same time frame and the provider may be waiting for multiple results in order to interpret others.  Please give [place] 48 hours in order for your provider to thoroughly review all the results before contacting the office for clarification of your results.   You have been scheduled for an endoscopy. Please follow written instructions given to you at your visit today. If you use inhalers (even only as needed), please bring them with you on the day of your procedure.  You will be contacted by [facility] in the next 2 days to arrange a complete abdominal ultrasound.  The number on your caller ID will be [phone], please answer when they call.  If you have not heard from them in 2 days please call [phone] to schedule.    Hold taking the chlor-Trimeton and Delsom for now per Dr Carlean Purl.  The nasal sprays are ok to take.  I appreciate the opportunity to care for you. Silvano Rusk, MD, [facility]

## 2021-05-06 NOTE — Progress Notes (Signed)
Janice [name] 70 y.o. 02-23-1951 [id number]  Assessment & Plan:   Encounter Diagnoses  Name Primary?   Liver cirrhosis secondary to NASH (nonalcoholic steatohepatitis) (HCC) Yes   Other ascites     Splenomegaly    Drowsiness    Umbilical hernia without obstruction and without gangrene     Orders Placed This Encounter  Procedures   US Abdomen Complete   Ammonia   Protime-INR   Ambulatory referral to Gastroenterology   Medications Discontinued During This Encounter  Medication Reason   Methylcobalamin (B12-ACTIVE PO) Completed Course   Azelastine-Fluticasone (DYMISTA) 137-50 MCG/ACT SUSP Cost of medication   She will do an ultrasound and labs as above as well as an EGD for variceal screening regarding her cirrhosis.  Depending upon the amount of ascites we will ultimately recommend paracentesis for diagnostic purposes if she is willing to do so.  She will probably need adjustment of diuretics.  She has had this confusion and sleepiness issue which is possibly related to Chlor-Trimeton and Delsym, so she will hold those.  Her ammonia was normal and on exam I do not think she has hepatic encephalopathy though she could have some subclinical findings of that.  Her upper airway cough syndrome does seem better at this point.  GERD can certainly could be playing some role and ascites could be compressing the stomach increasing regurgitation and reflux.   I appreciate the opportunity to care for this patient. CC: Prince Solian, MD Dr. Christinia Gully  Lab Results  Component Value Date   WBC 15.1 (H) 04/29/2021   HGB 11.9 (L) 04/29/2021   HCT 36.5 04/29/2021   MCV 83.7 04/29/2021   PLT 93.0 (L) 04/29/2021   Lab Results  Component Value Date   ALT 21 01/15/2021   AST 17 01/15/2021   ALKPHOS 75 01/15/2021   BILITOT 0.7 01/15/2021   Lab Results  Component Value Date   CREATININE 0.82 01/15/2021   BUN 12 01/15/2021   NA 138 01/15/2021   K 4.4 01/15/2021   CL 102  01/15/2021   CO2 28 01/15/2021   Lab Results  Component Value Date   INR 1.1 (H) 05/06/2021   INR 1.1 (H) 07/01/2020   INR 1.0 03/29/2019   Ammonia 32 Subjective:   Chief Complaint:cirrhosis  HPI 70 year old white woman with Janice [name] cirrhosis here for follow-up evaluation last seen in December 2021.  Please refer to that note for additional details.  At that time I had recommended an EGD for screening for varices but she had issues with an ill husband and financial issues that precluded scheduling.  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  She also has small lymphocytic lymphoma followed by Dr. Irene Limbo CBC on August 18 white count 15.1 hemoglobin 11.9 platelets 93K CMET in May with normal LFTs and a glucose of 316   Wt Readings from Last 3 Encounters:  05/06/21 222 lb (100.7 kg)  04/29/21 217 lb (98.4 kg)  03/08/21 219 lb (99.3 kg)   Today she talks a lot about her upper airway cough syndrome that has been evaluated by Dr. Melvyn Novas.  That is improved but she was on Chlor-Trimeton and Delsym and got sleepy and felt like it was unsafe to drive.  She has not had chronic confusion or sleepiness.  She also took some Tylenol with codeine I think.  She is  done with that.  Husband passed away from cirrhosis of the liver last year so she is somewhat familiar with the complications of it sounds like he went into liver failure and a a coma.  Grief over this improved.  Allergies  Allergen Reactions   Benzonatate Anaphylaxis and Hives   Fluticasone Furoate-Vilanterol Hives and Anaphylaxis   Iodine Hives, Swelling and Other (See Comments)    And Betadine/caused hives and swelling   Latex Hives, Itching, Rash and Other (See Comments)    Burning   Simvastatin Other (See Comments)    Caused neuropathy in arms Neuropathy in arms   Bupropion Other (See  Comments)    Negative thoughts. Crazy thoughts   Levofloxacin Other (See Comments)    Joint aches//leg, shoulder pain Joint pain   Penicillins Hives   Povidone-Iodine Hives   Promethazine Nausea Only and Other (See Comments)    Doesn't work   Amoxicillin Hives    Blisters Did it involve swelling of the face/tongue/throat, SOB, or low BP? Yes Did it involve sudden or severe rash/hives, skin peeling, or any reaction on the inside of your mouth or nose? Yes Did you need to seek medical attention at a hospital or doctor's office?Unknown When did it last happen? 2019      If all above answers are "NO", may proceed with cephalosporin use.    Chlorthalidone Other (See Comments)    Ears ringing    Diflucan [Fluconazole] Hives and Other (See Comments)    blister   Amoxicillin-Pot Clavulanate Rash   Doxycycline Itching and Diarrhea   Current Meds  Medication Sig   albuterol (PROVENTIL HFA;VENTOLIN HFA) 108 (90 Base) MCG/ACT inhaler Inhale 1-2 puffs into the lungs every 6 (six) hours as needed for wheezing or shortness of breath.   amLODipine (NORVASC) 10 MG tablet Take 1 tablet by mouth daily.   aspirin EC 81 MG tablet Take 1 tablet (81 mg total) by mouth daily. Swallow whole.   azelastine (ASTELIN) 0.1 % nasal spray Place 1 spray into both nostrils 2 (two) times daily. Use in each nostril as directed   Carboxymethylcellul-Glycerin (REFRESH OPTIVE PF OP) Apply 1 drop to eye 3 (three) times daily as needed (dry eyes).   chlorpheniramine (CHLOR-TRIMETON) 4 MG tablet Take 4 mg by mouth 2 (two) times daily as needed for allergies.   desoximetasone (TOPICORT) 0.25 % cream Apply 1 application topically 4 (four) times daily as needed (rash (applied to left ear daily)).    Dextromethorphan Polistirex (COUGH DM PO) Take by mouth. Delsom OTC medication   diclofenac sodium (VOLTAREN) 1 % GEL Apply 2 g topically 2 (two) times daily as needed (knee pain).    FLUZONE HIGH-DOSE QUADRIVALENT 0.7 ML SUSY     gabapentin (NEURONTIN) 300 MG capsule Take 600 mg by mouth daily with supper.    insulin NPH Human (HUMULIN N,NOVOLIN N) 100 UNIT/ML injection Inject 40-75 Units into the skin See admin instructions. Inject 75 units subcutaneously at breakfast, 40 units at lunch & 65 units at supper   insulin regular (NOVOLIN R,HUMULIN R) 100 units/mL injection Inject 30-45 Units into the skin See admin instructions. 40 at breakfast, 30 at lunch, 45 at dinner - they does fluctuate doses   irbesartan (AVAPRO) 300 MG tablet Take 1 tablet by mouth daily.   metFORMIN (GLUCOPHAGE-XR) 500 MG 24 hr tablet Take 1,000 mg by mouth 2 (two) times daily.   NAPROSYN 500 MG tablet Take 500 mg by mouth as needed.   omeprazole (PRILOSEC)  40 MG capsule Take 40 mg by mouth 2 (two) times daily.   PARoxetine (PAXIL) 20 MG tablet Take 10 mg by mouth daily.    rosuvastatin (CRESTOR) 10 MG tablet Take 1 tablet (10 mg total) by mouth daily.   spironolactone (ALDACTONE) 50 MG tablet Take 1 tablet (50 mg total) by mouth daily.   terconazole (TERAZOL 3) 0.8 % vaginal cream Place 1 applicator vaginally as needed (yeast infections).    vitamin C (ASCORBIC ACID) 500 MG tablet Take 500 mg by mouth 2 (two) times a day.    [DISCONTINUED] Azelastine-Fluticasone (DYMISTA) 137-50 MCG/ACT SUSP Place 1 puff into the nose 2 (two) times daily.   Past Medical History:  Diagnosis Date   Allergy    Asthma    seasonal   Cellulitis 2013   Left toe   Cirrhosis ([place])    Common migraine    History of   Complication of anesthesia    Per pt/had breathing problems with "block" during rotator cuff surgery. 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   DDD (degenerative disc disease)    Depression    denies takes paxil for migraines   Diabetes mellitus    type 2   Diabetic peripheral neuropathy (HCC)    Diverticulitis    Diverticulosis    DJD (degenerative joint disease)    GERD (gastroesophageal reflux disease)    History of colon polyps     hyperplastic   History of gastric polyp    Hyperlipidemia    Hypertension    Iron deficiency anemia    Obesity    OSA on CPAP    cpap   Pneumonia    april 2020  mild   Primary localized osteoarthritis of left knee 03/27/2019   Sensorineural hearing loss    Past Surgical History:  Procedure Laterality Date   ANKLE SURGERY     Left    COLONOSCOPY  05/2018   LEEP N/A 09/14/2018   Procedure: LOOP ELECTROSURGICAL EXCISION PROCEDURE (LEEP);  Surgeon: Arvella Nigh, MD;  Location: [facility];  Service: Gynecology;  Laterality: N/A;   ROTATOR CUFF REPAIR Bilateral 2012, 2015   TONSILLECTOMY     TOTAL KNEE ARTHROPLASTY Left 04/08/2019   Procedure: TOTAL KNEE ARTHROPLASTY;  Surgeon: Elsie Saas, MD;  Location: [facility];  Service: Orthopedics;  Laterality: Left;   UPPER GI ENDOSCOPY  05/2018   Social History   Social History Narrative   1 caffeine drink daily    Right-handed   Lives at home with husband.   family history includes Bone cancer in her mother; Breast cancer in her mother; Diabetes Mellitus II in her brother, father, and sister; Heart failure in her father; Hypertension in her brother and father; Obesity in her sister; Other in her sister.   Review of Systems As per HPI  Objective:   Physical Exam  BP 116/62   Pulse 71   Ht 5' 4.5" (1.638 m)   Wt 222 lb (100.7 kg)   SpO2 97%   BMI 37.52 kg/m  Obese NAD Alert and oriented x 3 but tangential scattered thoughts? No asterixis Lung cta Cor distant S1S2 Abd obese w small reducible umbilical hernia and I suspect sig ascites - nontender Ext no edema Skin no stigmata CLD  Data reviewed also includes 20 03 May 2017 pulmonary note  42 minutes before during and after the in person portion of visit involved in care for this episode

## 2021-05-18 ENCOUNTER — Other Ambulatory Visit: Payer: Self-pay

## 2021-05-18 ENCOUNTER — Inpatient Hospital Stay: Payer: HMO

## 2021-05-18 ENCOUNTER — Inpatient Hospital Stay: Payer: HMO | Attending: Hematology | Admitting: Hematology

## 2021-05-18 VITALS — BP 139/57 | HR 87 | Temp 98.9°F | Resp 17 | Ht 64.5 in | Wt 225.0 lb

## 2021-05-18 DIAGNOSIS — R109 Unspecified abdominal pain: Secondary | ICD-10-CM | POA: Diagnosis not present

## 2021-05-18 DIAGNOSIS — K7460 Unspecified cirrhosis of liver: Secondary | ICD-10-CM | POA: Diagnosis not present

## 2021-05-18 DIAGNOSIS — D508 Other iron deficiency anemias: Secondary | ICD-10-CM | POA: Insufficient documentation

## 2021-05-18 DIAGNOSIS — C9110 Chronic lymphocytic leukemia of B-cell type not having achieved remission: Secondary | ICD-10-CM

## 2021-05-18 DIAGNOSIS — E785 Hyperlipidemia, unspecified: Secondary | ICD-10-CM | POA: Insufficient documentation

## 2021-05-18 DIAGNOSIS — E118 Type 2 diabetes mellitus with unspecified complications: Secondary | ICD-10-CM | POA: Insufficient documentation

## 2021-05-18 DIAGNOSIS — R197 Diarrhea, unspecified: Secondary | ICD-10-CM | POA: Diagnosis not present

## 2021-05-18 DIAGNOSIS — C8300 Small cell B-cell lymphoma, unspecified site: Secondary | ICD-10-CM

## 2021-05-18 DIAGNOSIS — I10 Essential (primary) hypertension: Secondary | ICD-10-CM | POA: Diagnosis not present

## 2021-05-18 DIAGNOSIS — Z96652 Presence of left artificial knee joint: Secondary | ICD-10-CM | POA: Insufficient documentation

## 2021-05-18 DIAGNOSIS — R21 Rash and other nonspecific skin eruption: Secondary | ICD-10-CM | POA: Diagnosis not present

## 2021-05-18 DIAGNOSIS — M1990 Unspecified osteoarthritis, unspecified site: Secondary | ICD-10-CM | POA: Insufficient documentation

## 2021-05-18 DIAGNOSIS — D696 Thrombocytopenia, unspecified: Secondary | ICD-10-CM | POA: Insufficient documentation

## 2021-05-18 DIAGNOSIS — Z803 Family history of malignant neoplasm of breast: Secondary | ICD-10-CM | POA: Insufficient documentation

## 2021-05-18 LAB — CBC WITH DIFFERENTIAL/PLATELET
Abs Immature Granulocytes: 0.08 10*3/uL — ABNORMAL HIGH (ref 0.00–0.07)
Basophils Absolute: 0.1 10*3/uL (ref 0.0–0.1)
Basophils Relative: 0 %
Eosinophils Absolute: 0.1 10*3/uL (ref 0.0–0.5)
Eosinophils Relative: 1 %
HCT: 33 % — ABNORMAL LOW (ref 36.0–46.0)
Hemoglobin: 10.6 g/dL — ABNORMAL LOW (ref 12.0–15.0)
Immature Granulocytes: 1 %
Lymphocytes Relative: 58 %
Lymphs Abs: 7.3 10*3/uL — ABNORMAL HIGH (ref 0.7–4.0)
MCH: 27.4 pg (ref 26.0–34.0)
MCHC: 32.1 g/dL (ref 30.0–36.0)
MCV: 85.3 fL (ref 80.0–100.0)
Monocytes Absolute: 1.2 10*3/uL — ABNORMAL HIGH (ref 0.1–1.0)
Monocytes Relative: 9 %
Neutro Abs: 3.8 10*3/uL (ref 1.7–7.7)
Neutrophils Relative %: 31 %
Platelets: 87 10*3/uL — ABNORMAL LOW (ref 150–400)
RBC: 3.87 MIL/uL (ref 3.87–5.11)
RDW: 15 % (ref 11.5–15.5)
WBC: 12.5 10*3/uL — ABNORMAL HIGH (ref 4.0–10.5)
nRBC: 0 % (ref 0.0–0.2)

## 2021-05-18 LAB — CMP (CANCER CENTER ONLY)
ALT: 21 U/L (ref 0–44)
AST: 14 U/L — ABNORMAL LOW (ref 15–41)
Albumin: 4 g/dL (ref 3.5–5.0)
Alkaline Phosphatase: 76 U/L (ref 38–126)
Anion gap: 9 (ref 5–15)
BUN: 13 mg/dL (ref 8–23)
CO2: 24 mmol/L (ref 22–32)
Calcium: 8.9 mg/dL (ref 8.9–10.3)
Chloride: 106 mmol/L (ref 98–111)
Creatinine: 1.01 mg/dL — ABNORMAL HIGH (ref 0.44–1.00)
GFR, Estimated: 60 mL/min — ABNORMAL LOW (ref >60)
Glucose, Bld: 324 mg/dL — ABNORMAL HIGH (ref 70–99)
Potassium: 4.5 mmol/L (ref 3.5–5.1)
Sodium: 139 mmol/L (ref 135–145)
Total Bilirubin: 0.5 mg/dL (ref 0.3–1.2)
Total Protein: 6.1 g/dL — ABNORMAL LOW (ref 6.5–8.1)

## 2021-05-18 LAB — LACTATE DEHYDROGENASE: LDH: 196 U/L — ABNORMAL HIGH (ref 98–192)

## 2021-05-19 ENCOUNTER — Telehealth: Payer: Self-pay | Admitting: Hematology

## 2021-05-19 ENCOUNTER — Institutional Professional Consult (permissible substitution): Payer: HMO | Admitting: Student

## 2021-05-19 DIAGNOSIS — K7581 Nonalcoholic steatohepatitis (NASH): Secondary | ICD-10-CM | POA: Diagnosis not present

## 2021-05-19 DIAGNOSIS — R221 Localized swelling, mass and lump, neck: Secondary | ICD-10-CM | POA: Diagnosis not present

## 2021-05-19 DIAGNOSIS — K429 Umbilical hernia without obstruction or gangrene: Secondary | ICD-10-CM | POA: Diagnosis not present

## 2021-05-19 DIAGNOSIS — G4733 Obstructive sleep apnea (adult) (pediatric): Secondary | ICD-10-CM | POA: Diagnosis not present

## 2021-05-19 NOTE — Telephone Encounter (Signed)
Scheduled follow-up appointment per 9/6 los. Patient is aware. 

## 2021-05-21 ENCOUNTER — Other Ambulatory Visit: Payer: Self-pay

## 2021-05-21 ENCOUNTER — Ambulatory Visit (HOSPITAL_COMMUNITY)
Admission: RE | Admit: 2021-05-21 | Discharge: 2021-05-21 | Disposition: A | Discharge status: Home / Self Care | Payer: HMO | Source: Ambulatory Visit | Attending: Internal Medicine | Admitting: Internal Medicine

## 2021-05-21 DIAGNOSIS — K7581 Nonalcoholic steatohepatitis (NASH): Secondary | ICD-10-CM | POA: Diagnosis not present

## 2021-05-21 DIAGNOSIS — R188 Other ascites: Secondary | ICD-10-CM | POA: Diagnosis not present

## 2021-05-21 DIAGNOSIS — K7460 Unspecified cirrhosis of liver: Secondary | ICD-10-CM | POA: Diagnosis not present

## 2021-05-21 DIAGNOSIS — R161 Splenomegaly, not elsewhere classified: Secondary | ICD-10-CM | POA: Diagnosis not present

## 2021-05-21 DIAGNOSIS — K824 Cholesterolosis of gallbladder: Secondary | ICD-10-CM | POA: Diagnosis not present

## 2021-05-25 NOTE — Progress Notes (Signed)
HEMATOLOGY/ONCOLOGY CLINIC NOTE  Date of Service: .05/18/2021   Patient Care Team: Prince Solian, MD as PCP - General (Internal Medicine)  CHIEF COMPLAINTS/PURPOSE OF CONSULTATION:  CLL/SLL  HISTORY OF PRESENTING ILLNESS:   Janice [name] is a wonderful 70 y.o. female who has been referred to [place] by Dr. 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 The pt reports that she is doing well overall.   The pt reports that her last mammogram was a part of routine screening. She had no constitutional symptoms prior and while she has felt differently over the last year she believes this was due to her other medical concerns. Pt reports sharp, intermittent abdominal pain.   Pt was supposed to have a knee replacement in 2019, but couldn't due to iron deficiency anemia. She was able to complete her left knee replacement in mid-2020. Pt contracted the [place] virus in 2020. Pt received her COVID19 vaccines in May.   Pt uses Diclofenac topically for her left arm pain and left knee pain. She has had intermittent rashes that arise in the areas that she receives her insulin injections.   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   Of note prior to the patient's visit today, pt has had MM Breast ([id number]) completed on 02/28/2020 with results revealing "Bilateral axillary adenopathy."  Pt has had Left axilla lymph node biopsy [id number]) completed on 03/30/2020 with results revealing "CHRONIC LYMPHOCYTIC LYMPHOMA/SMALL LYMPHOCYTIC LYMPHOMA".   Pt has had Left Axilla Flow Pathology Report [id number]) completed on 03/30/2020 with results revealing "Monoclonal B-cell population with coexpression of CD5 comprises 80% of all lymphocytes".  Most recent lab results (03/11/2020) of CBC is as follows: all values are WNL except for PLT at 99K.  On review of systems, pt reports rash, watery stools, abdominal pain and denies fevers, chills, night  sweats, unexpected weight loss and any other symptoms.   On PMHx the pt reports Type II Diabetes, DJD, HLD, HTN, Iron deficiency anemia, Arthritis, Total knee arthroplasty.  On Social Hx the pt reports that she is a non-smoker.  On Family Hx the pt reports that her mother was diagnosed with breast cancer at 76. She also had two maternal aunts who had breast cancer.   INTERVAL HISTORY:  Janice [name] is a wonderful 70 y.o. 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 The patient's last visit with [place] was on 01/15/2021. The pt reports that she is doing well overall.  The pt reports that she has had some issues with fluid retention and has been following with Dr. Arelia Longest for management of her liver cirrhosis.   She has an appointment tomorrow at [facility] for possible removal of her neck lipoma.  Notes no fevers no chills no night sweats no unexpected weight loss.  Labs done today were reviewed with the patient CBC shows stable WBC count of 12.5k , mild anemia with hemoglobin of 10.6, platelets of 87k.  Her low platelet counts appear to be primarily related to her liver cirrhosis. CMP unremarkable other than blood sugar of 324 Discussed need for optimization of her diabetes management. LDH stable at 196  On review of systems, pt reports some abdominal fullness.  No fevers no chills no night sweats no new lumps or bumps.  No overt evidence of bleeding.  MEDICAL HISTORY:  Past Medical History:  Diagnosis Date   Allergy    Asthma    seasonal   Cellulitis 2013  Left toe   Cirrhosis (HCC)    Common migraine    History of   Complication of anesthesia    Per pt/had breathing problems with "block" during rotator cuff surgery. 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[place])     SURGICAL HISTORY: Past Surgical History:  Procedure Laterality Date   ANKLE SURGERY     Left    COLONOSCOPY  05/2018   LEEP N/A 09/14/2018   Procedure: LOOP ELECTROSURGICAL EXCISION PROCEDURE (LEEP);  Surgeon: Arvella Nigh, MD;  Location: [facility];  Service: Gynecology;  Laterality: N/A;   ROTATOR CUFF REPAIR Bilateral 2012, 2015   TONSILLECTOMY     TOTAL KNEE ARTHROPLASTY Left 04/08/2019   Procedure: TOTAL KNEE ARTHROPLASTY;  Surgeon: Elsie Saas, MD;  Location: [facility];  Service: Orthopedics;  Laterality: Left;   UPPER GI ENDOSCOPY  05/2018    SOCIAL HISTORY: Social History   Socioeconomic History   Marital status: Widowed    Spouse name: Not on file   Number of children: 0   Years of education: 3 years college   Highest education level: Not on file  Occupational History   Occupation: Unemployed  Tobacco Use   Smoking status: Never   Smokeless tobacco: Never  Vaping Use   Vaping Use: Never used  Substance and Sexual Activity   Alcohol use: Not Currently    Comment: very rare   Drug use: No   Sexual activity: Not Currently    Birth control/protection: None, Post-menopausal  Other Topics Concern   Not on file  Social History Narrative   1 caffeine drink daily    Right-handed   Lives at home with husband.   Social Determinants of Health   Financial Resource Strain: Medium Risk   Difficulty of Paying Living Expenses: Somewhat hard  Food Insecurity: No Food Insecurity   Worried About Charity fundraiser in the Last Year: Never true   Ran Out of Food in the Last  Year: Never true  Transportation Needs: No Transportation Needs   Lack of Transportation (Medical): No   Lack of Transportation (Non-Medical): No  Physical Activity: Not on file  Stress: Not on file  Social Connections: Socially Integrated   Frequency of Communication with Friends and Family: More than three times a week   Frequency of Social Gatherings with Friends and Family: More than three times a week   Attends Religious Services: More than 4 times per year   Active Member of [employer] or Organizations: Yes   Attends Music therapist: More than 4 times per year   Marital Status: Married  Human resources officer Violence: Not on file    FAMILY HISTORY: Family History  Problem Relation Age of Onset  Breast cancer Mother    Bone cancer Mother    Heart failure Father    Diabetes Mellitus II Father    Hypertension Father    Diabetes Mellitus II Sister    Obesity Sister    Other Sister        retina problem   Hypertension Brother    Diabetes Mellitus II Brother    Colon cancer Neg Hx    Rectal cancer Neg Hx    Stomach cancer Neg Hx    Esophageal cancer Neg Hx    Pancreatic cancer Neg Hx    Liver disease Neg Hx     ALLERGIES:  is allergic to benzonatate, fluticasone furoate-vilanterol, iodine, latex, simvastatin, bupropion, levofloxacin, penicillins, povidone-iodine, promethazine, amoxicillin, chlorthalidone, diflucan [fluconazole], amoxicillin-pot clavulanate, and doxycycline.  MEDICATIONS:  Current Outpatient Medications  Medication Sig Dispense Refill   albuterol (PROVENTIL HFA;VENTOLIN HFA) 108 (90 Base) MCG/ACT inhaler Inhale 1-2 puffs into the lungs every 6 (six) hours as needed for wheezing or shortness of breath.     amLODipine (NORVASC) 10 MG tablet Take 1 tablet by mouth daily.     aspirin EC 81 MG tablet Take 1 tablet (81 mg total) by mouth daily. Swallow whole. 90 tablet 3   azelastine (ASTELIN) 0.1 % nasal spray Place 1 spray into both nostrils 2 (two)  times daily. Use in each nostril as directed 90 mL 3   Carboxymethylcellul-Glycerin (REFRESH OPTIVE PF OP) Apply 1 drop to eye 3 (three) times daily as needed (dry eyes).     chlorpheniramine (CHLOR-TRIMETON) 4 MG tablet Take 4 mg by mouth 2 (two) times daily as needed for allergies.     desoximetasone (TOPICORT) 0.25 % cream Apply 1 application topically 4 (four) times daily as needed (rash (applied to left ear daily)).      Dextromethorphan Polistirex (COUGH DM PO) Take by mouth. Delsom OTC medication     diclofenac sodium (VOLTAREN) 1 % GEL Apply 2 g topically 2 (two) times daily as needed (knee pain).   2   FLUZONE HIGH-DOSE QUADRIVALENT 0.7 ML SUSY      gabapentin (NEURONTIN) 300 MG capsule Take 600 mg by mouth daily with supper.      insulin NPH Human (HUMULIN N,NOVOLIN N) 100 UNIT/ML injection Inject 40-75 Units into the skin See admin instructions. Inject 75 units subcutaneously at breakfast, 40 units at lunch & 65 units at supper     insulin regular (NOVOLIN R,HUMULIN R) 100 units/mL injection Inject 30-45 Units into the skin See admin instructions. 40 at breakfast, 30 at lunch, 45 at dinner - they does fluctuate doses     irbesartan (AVAPRO) 300 MG tablet Take 1 tablet by mouth daily.     metFORMIN (GLUCOPHAGE-XR) 500 MG 24 hr tablet Take 1,000 mg by mouth 2 (two) times daily.     NAPROSYN 500 MG tablet Take 500 mg by mouth as needed.     omeprazole (PRILOSEC) 40 MG capsule Take 40 mg by mouth 2 (two) times daily.     PARoxetine (PAXIL) 20 MG tablet Take 10 mg by mouth daily.      rosuvastatin (CRESTOR) 10 MG tablet Take 1 tablet (10 mg total) by mouth daily. 90 tablet 3   spironolactone (ALDACTONE) 50 MG tablet Take 1 tablet (50 mg total) by mouth daily. 90 tablet 0   terconazole (TERAZOL 3) 0.8 % vaginal cream Place 1 applicator vaginally as needed (yeast infections).      vitamin C (ASCORBIC ACID) 500 MG tablet  Take 500 mg by mouth 2 (two) times a day.      No current  facility-administered medications for this visit.    REVIEW OF SYSTEMS:   .10 Point review of Systems was done is negative except as noted above.   PHYSICAL EXAMINATION: ECOG PERFORMANCE STATUS: 1 - Symptomatic but completely ambulatory  . GENERAL:alert, in no acute distress and comfortable SKIN: no acute rashes, no significant lesions EYES: conjunctiva are pink and non-injected, sclera anicteric OROPHARYNX: MMM, no exudates, no oropharyngeal erythema or ulceration NECK: supple, no JVD LYMPH:  no palpable lymphadenopathy in the cervical, axillary or inguinal regions LUNGS: clear to auscultation b/l with normal respiratory effort HEART: regular rate & rhythm ABDOMEN:  normoactive bowel sounds , non tender, not distended. Extremity: no pedal edema PSYCH: alert & oriented x 3 with fluent speech NEURO: no focal motor/sensory deficits  LABORATORY DATA:  I have reviewed the data as listed  . CBC Latest Ref Rng & Units 05/18/2021 04/29/2021 01/15/2021  WBC 4.0 - 10.5 K/uL 12.5(H) 15.1(H) 13.1(H)  Hemoglobin 12.0 - 15.0 g/dL 10.6(L) 11.9(L) 12.1  Hematocrit 36.0 - 46.0 % 33.0(L) 36.5 38.3  Platelets 150 - 400 K/uL 87(L) 93.0(L) 90(L)   . CBC    Component Value Date/Time   WBC 12.5 (H) 05/18/2021 1431   RBC 3.87 05/18/2021 1431   HGB 10.6 (L) 05/18/2021 1431   HGB 12.8 03/11/2020 1315   HCT 33.0 (L) 05/18/2021 1431   HCT 40.1 03/11/2020 1315   PLT 87 (L) 05/18/2021 1431   PLT 99 (LL) 03/11/2020 1315   MCV 85.3 05/18/2021 1431   MCV 87 03/11/2020 1315   MCH 27.4 05/18/2021 1431   MCHC 32.1 05/18/2021 1431   RDW 15.0 05/18/2021 1431   RDW 14.0 03/11/2020 1315   LYMPHSABS 7.3 (H) 05/18/2021 1431   MONOABS 1.2 (H) 05/18/2021 1431   EOSABS 0.1 05/18/2021 1431   BASOSABS 0.1 05/18/2021 1431     . CMP Latest Ref Rng & Units 05/18/2021 01/15/2021 08/20/2020  Glucose 70 - 99 mg/dL 324(H) 316(H) 195(H)  BUN 8 - 23 mg/dL '13 12 13  '$ Creatinine 0.44 - 1.00 mg/dL 1.01(H) 0.82 0.83   Sodium 135 - 145 mmol/L 139 138 140  Potassium 3.5 - 5.1 mmol/L 4.5 4.4 4.9  Chloride 98 - 111 mmol/L 106 102 105  CO2 22 - 32 mmol/L '24 28 25  '$ Calcium 8.9 - 10.3 mg/dL 8.9 8.9 9.4  Total Protein 6.5 - 8.1 g/dL 6.1(L) 6.4(L) 6.5  Total Bilirubin 0.3 - 1.2 mg/dL 0.5 0.7 0.6  Alkaline Phos 38 - 126 U/L 76 75 73  AST 15 - 41 U/L 14(L) 17 20  ALT 0 - 44 U/L '21 21 25   '$ 04/08/2020 FISH/CLL Panel:   03/30/2020 Left Axilla Flow Pathology Report [id number]):   03/30/2020 Left axilla lymph node Bx ([id number]):   RADIOGRAPHIC STUDIES: I have personally reviewed the radiological images as listed and agreed with the findings in the report. US Abdomen Complete  Result Date: 05/24/2021 CLINICAL DATA:  History of cirrhosis, ascites, splenomegaly. History of lymphoma. EXAM: ABDOMEN ULTRASOUND COMPLETE COMPARISON:  PET-CT 05/01/2020. FINDINGS: Gallbladder: Limited exam due to patient's body habitus. No gallstones noted. 2 mm gallbladder polyp. Gallbladder wall is thickened at 6 mm. Although cholecystitis can not be excluded, these findings may be related to hypoproteinemic state. Negative Murphy sign. Common bile duct: Diameter: 2 mm Liver: Heterogeneous hepatic parenchymal pattern and slightly nodular hepatic contour consistent with the patient's known cirrhosis. Differential diagnosis  includes hemangiomas, infectious etiologies, metastatic disease, and lymphoma. A 1.4 cm and 0.8 cm hyperechoic focus noted the left hepatic lobe. Punctate echogenic focus noted right hepatic lobe, most likely calcification. Portal vein is patent on color Doppler imaging with normal direction of blood flow towards the liver. IVC: No abnormality visualized. Pancreas: Visualized portion unremarkable. Spleen: The spleen is enlarged at 19.6 cm. Multiple hyperechoic foci are noted with the largest measuring 1.5 cm. Differential diagnosis includes multiple vascular lesions, infectious etiologies, metastatic disease, lymphoma.  Right Kidney: Length: 12.3 cm. Echogenicity within normal limits. No mass or hydronephrosis visualized. Left Kidney: Length: 12.9 cm. Echogenicity within normal limits. Punctate hyperechoic nonshadowing focus noted in the left kidney. This could represent a nonshadowing cortical calcification or tiny angiomyolipoma. No associated mass. No hydronephrosis visualized. Abdominal aorta: Upper abdominal aorta appears normal with a diameter 1.7 cm distal abdominal aorta difficult to visualize due to overlying bowel gas. Other findings: No evidence of ascites. IMPRESSION: 1. Limited exam due to patient's body habitus. No gallstones. 2 mm gallbladder polyp. Gallbladder wall is thickened at 6 mm. Although cholecystitis can not be excluded, these findings may be related to hypoproteinemic state. No biliary distention. Negative Murphy sign. 2. Hyperechoic foci are noted in the liver and spleen. Differential diagnosis includes hemangiomas, metastatic disease, and lymphoma. 3. Heterogeneous hepatic parenchymal pattern with slightly nodular hepatic contour consistent with the patient's known history of cirrhosis. 4.  Splenomegaly to 19.6 cm. 5. Punctate hyperechoic nonshadowing focus noted in the left kidney. This could represent a nonshadowing cortical calcification or tiny angiomyolipoma. No associated mass. Electronically Signed   By: Marcello Moores  Register M.D.   On: 05/24/2021 12:59    ASSESSMENT & PLAN:   70 yo with   1) CLL/SLL -02/28/2020 MM Breast ([id number]) revealed "Bilateral axillary adenopathy." -03/30/2020 Left axilla lymph node biopsy ([id number]) revealed "CHRONIC LYMPHOCYTIC LYMPHOMA/SMALL LYMPHOCYTIC LYMPHOMA".  -03/30/2020 Left Axilla Flow Pathology Report [id number]) revealed "Monoclonal B-cell population with coexpression of CD5 comprises 80% of all lymphocytes". -04/20/20 PET/CT revealed 1. Adenopathy within the neck, chest, abdomen, and pelvis, consistent with active lymphoma. Much of this is not  significantly hypermetabolic. Some upper abdominal nodes are moderately hypermetabolic. 2) Mild thrombocytopenia PLT 99k likely related to CLL/SLL ?ITP  -Most likely related to advanced liver cirrhosis, less likely related to CLL  PLAN: -Discussed pt labwork today, 05/18/2021; Labs done today were reviewed with the patient CBC shows stable WBC count of 12.5k , mild anemia with hemoglobin of 10.6, platelets of 87k.  Her low platelet counts appear to be primarily related to her liver cirrhosis. CMP unremarkable other than blood sugar of 324 Discussed need for optimization of her diabetes management. LDH stable at 196  -No lab or clinical evidence of CLL progression requiring treatment at this time. Will continue watchful observation. -At this time the patient's mild anemia and thrombocytopenia appear to be related to liver cirrhosis. -She will continue following up with Dr. Silvano Rusk for management of her liver cirrhosis and [place] screening. -Continue to f/u w PCP regarding blood sugar and insulin management.   FOLLOW UP: RTC with Dr Irene Limbo with labs in 4 months  . The total time spent in the appointment was 20 minutes and more than 50% was on counseling and direct patient cares.  All of the patient's questions were answered with apparent satisfaction. The patient knows to call the clinic with any problems, questions or concerns.    Sullivan Lone MD [place] AAHIVMS [facility] Hematology/Oncology Physician [place]  [facility]  (Office):       [phone] (Work cell):  [phone] (Fax):           [phone]

## 2021-05-27 ENCOUNTER — Other Ambulatory Visit: Payer: Self-pay

## 2021-05-27 ENCOUNTER — Ambulatory Visit: Payer: HMO | Admitting: Internal Medicine

## 2021-05-27 ENCOUNTER — Encounter: Payer: Self-pay | Admitting: Internal Medicine

## 2021-05-27 VITALS — BP 130/70 | HR 69 | Temp 98.1°F | Ht 64.5 in | Wt 221.4 lb

## 2021-05-27 DIAGNOSIS — Z23 Encounter for immunization: Secondary | ICD-10-CM | POA: Diagnosis not present

## 2021-05-27 DIAGNOSIS — J45991 Cough variant asthma: Secondary | ICD-10-CM | POA: Diagnosis not present

## 2021-05-27 DIAGNOSIS — R058 Other specified cough: Secondary | ICD-10-CM

## 2021-05-27 MED ORDER — GABAPENTIN 100 MG PO CAPS: 100.0000 mg | ORAL_CAPSULE | Freq: Every day | ORAL | 2 refills | Status: DC

## 2021-05-27 NOTE — Progress Notes (Signed)
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 rec dysmista / zyrtec referred to pulmonary clinic 04/29/2021 by Dr   Dagmar Hait for cough/wheeze with prn use of albuterol x years but rarely used it until around March 12 2021    History of Present Illness  04/29/2021  Pulmonary/ 1st office eval/Drew Herman  Chief Complaint  Patient presents with   Consult    Cough and wheezing for 3 weeks  Onset July 27th 2022 lots of nasal congestion/ drainage turned slt yellow while maint on flonase and clariton rx zpak > worse with cough/ wheeze rx prednisone > nebulizer  in office helped some > but still cough to point of gagging despite breztri 04/22/21 Dyspnea:  not limiting but not longer working out on ex bike Cough: minimal  Sleep: cpap / at least once a night wakes up cough  SABA use: last used 5 h  prior Finishing omnicef / no longer purulent secretions at this point  Rec Stop breztri Take omeprazole 40 mg Take 30- 60 min before your first and last meals of the day  add gabapentin 300 mg each am until the cough is gone for at least a week  GERD diet reviewed, bed blocks rec  Only use your albuterol as a rescue medication Take delsym two tsp every 12 hours and supplement if needed with  Tylenol #3   up to 1-2 every 4 hours to suppress the urge to cough. Swallowing water and/or using ice chips/non mint and menthol containing candies (such as lifesavers or sugarless jolly ranchers) are also effective.  You should rest your voice and avoid activities that you know make you cough. Once you have eliminated the cough for 3 straight days try reducing the Tylenol #3 first,  then the delsym as tolerated.   Stop  clariton  Dymista (takes the place of flonas)  twice daily should eliminate the drippy nose (if too expensive ok  For drainage / throat tickle try take CHLORPHENIRAMINE  4 mg  (Chlortab '4mg'$   at [employer] should be easiest to find in the green box)  take one every 4 hours as needed - available over the counter- may cause drowsiness so start with just a dose or two an hour before bedtime and see how you tolerate it before trying in daytime )  Please remember to go to the lab department  Allergy profile 04/29/2021 >  Eos 0.1 /  IgE  6  Depomedrol 120  mg IM today Please schedule a follow up office visit in 4 weeks, sooner if needed    05/27/2021  f/u ov/Shyne Lehrke re: cough x 2015 c/w uacs maint on prilosec 40 mg bid  and gabapentin 300 mg with supper - could not tol 300 mg in am   Dyspnea:  one block takes about 23 min and stops a couple of times due to sob but overall improving with regular walking  Cough: still some throat clearing  Sleeping: cpap/ risers and sleeping fine  SABA use: none  02: none  Covid status:   vax x 3     No obvious day to day or daytime variability or assoc excess/ purulent  sputum or mucus plugs or hemoptysis or cp or chest tightness, subjective wheeze or overt sinus or hb symptoms.   Sleeping as above  without nocturnal  or early am exacerbation  of respiratory  c/o's or need for noct saba. Also denies any obvious fluctuation of symptoms with weather or environmental changes or other aggravating or alleviating factors except as outlined above   No unusual exposure hx or h/o childhood pna/ asthma or knowledge of premature birth.  Current Allergies, Complete Past Medical History, Past Surgical History, Family History, and Social History were reviewed in [employer] record.  ROS  The following are not active complaints unless bolded Hoarseness, sore throat, dysphagia, dental problems, itching, sneezing,  nasal congestion or discharge of excess  mucus or purulent secretions, ear ache,   fever, chills, sweats, unintended wt loss or wt gain, classically pleuritic or exertional cp,  orthopnea pnd or arm/hand swelling  or leg swelling, presyncope, palpitations, abdominal pain, anorexia, nausea, vomiting, diarrhea  or change in bowel habits or change in bladder habits, change in stools or change in urine, dysuria, hematuria,  rash, arthralgias, visual complaints, headache, numbness, weakness or ataxia or problems with walking or coordination,  change in mood or  memory.                      Past Medical History:  Diagnosis Date   Allergy    Asthma    seasonal   Cellulitis 2013   Left toe   Cirrhosis ([place])    Common migraine    History of   Complication of anesthesia    Per pt/had breathing problems with "block" during rotator cuff surgery. 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   DDD (degenerative disc disease)    Depression    denies takes paxil for migraines   Diabetes mellitus    type 2   Diabetic peripheral neuropathy (HCC)    Diverticulitis    Diverticulosis    DJD (degenerative joint disease)    GERD (gastroesophageal reflux disease)    History of colon polyps    hyperplastic   History of gastric polyp    Hyperlipidemia    Hypertension    Iron deficiency anemia    Obesity    OSA on CPAP    cpap   Pneumonia    april 2020  mild   Primary localized osteoarthritis of left knee 03/27/2019   Sensorineural hearing loss        Objective:       Wt Readings from Last 3 Encounters:  05/27/21 221 lb 6.4 oz (100.4 kg)  05/18/21 225 lb (102.1 kg)  05/06/21 222 lb (100.7 kg)      Vital signs reviewed  05/27/2021  - Note at rest 02 sats  97% on RA   General appearance:    amb obes wf nad still occ throat clearing   HEENT : pt wearing mask not removed for exam due to covid -19 concerns.    NECK :  without JVD/Nodes/TM/ nl carotid upstrokes bilaterally   LUNGS: no acc muscle use,  Nl contour chest which is  clear to A and P bilaterally without cough on insp or exp maneuvers   CV:  RRR  no s3 or murmur or increase in P2, and no edema   ABD: quite obese soft and nontender with nl inspiratory excursion in the supine position. No bruits or organomegaly appreciated, bowel sounds nl  MS:  Nl gait/ ext warm  without deformities, calf tenderness, cyanosis or clubbing No obvious joint restrictions   SKIN: warm and dry without lesions    NEURO:  alert, approp, nl sensorium with  no motor or cerebellar deficits apparent.          Assessment     Outpatient Encounter Medications as of 05/27/2021  Medication Sig   albuterol (PROVENTIL HFA;VENTOLIN HFA) 108 (90 Base) MCG/ACT inhaler Inhale 1-2 puffs into the lungs every 6 (six) hours as needed for wheezing or shortness of breath.   amLODipine (NORVASC) 10 MG tablet Take 1 tablet by mouth daily.   aspirin EC 81 MG tablet Take 1 tablet (81 mg total) by mouth daily. Swallow whole.   azelastine (ASTELIN) 0.1 % nasal spray Place 1 spray into both nostrils 2 (two) times daily. Use in each nostril as directed   Carboxymethylcellul-Glycerin (REFRESH OPTIVE PF OP) Apply 1 drop to eye 3 (three) times daily as needed (dry eyes).   desoximetasone (TOPICORT) 0.25 % cream Apply 1 application topically 4 (four) times daily as needed (rash (applied to left ear daily)).    Dextromethorphan Polistirex (COUGH DM PO) Take by mouth. Delsom OTC medication   diclofenac sodium (VOLTAREN) 1 % GEL Apply 2 g topically 2 (two) times daily as needed (knee pain).    gabapentin (NEURONTIN) 100 MG capsule Take 1 capsule (100 mg total) by mouth daily. One three times daily   gabapentin (NEURONTIN) 300 MG capsule Take 600 mg by mouth daily with supper.    insulin NPH Human (HUMULIN N,NOVOLIN N) 100 UNIT/ML injection Inject 40-75 Units into the skin See admin instructions. Inject 75 units subcutaneously at breakfast, 40 units at lunch & 65 units at supper   insulin regular (NOVOLIN  R,HUMULIN R) 100 units/mL injection Inject 30-45 Units into the skin See admin instructions. 40 at breakfast, 30 at lunch, 45 at dinner - they does fluctuate doses   irbesartan (AVAPRO) 300 MG tablet Take 1 tablet by mouth daily.   metFORMIN (GLUCOPHAGE-XR) 500 MG 24 hr tablet Take 1,000 mg by mouth 2 (two) times daily.   omeprazole (PRILOSEC) 40 MG capsule Take 40 mg by mouth 2 (two) times daily.   PARoxetine (PAXIL) 20 MG tablet Take 10 mg by mouth daily.    rosuvastatin (CRESTOR) 10 MG tablet Take 1 tablet (10 mg total) by mouth daily.   spironolactone (ALDACTONE) 50 MG tablet Take 1 tablet (50 mg total) by mouth daily.   terconazole (TERAZOL 3) 0.8 % vaginal cream Place 1 applicator vaginally as needed (yeast infections).    vitamin C (ASCORBIC ACID) 500 MG tablet Take 500 mg by mouth 2 (two) times a day.    chlorpheniramine (CHLOR-TRIMETON) 4 MG tablet Take 4 mg by mouth 2 (two) times daily as needed for allergies. (Patient not taking: Reported on 05/27/2021)   [DISCONTINUED] FLUZONE HIGH-DOSE QUADRIVALENT 0.7 ML SUSY    [DISCONTINUED] NAPROSYN 500 MG tablet Take 500 mg by mouth as needed.   No facility-administered encounter medications on file as of 05/27/2021.

## 2021-05-27 NOTE — Patient Instructions (Addendum)
Flu shot today   No change in medications except add gabapentin 100 mg each am - ok to adjust up to a maximum of 300 mg three times daily    Please schedule a follow up visit in 3 months but call sooner if needed or let me refer you to DR Joya Gaskins at [facility] for irritable larynx syndrome

## 2021-05-28 ENCOUNTER — Encounter: Payer: Self-pay | Admitting: Internal Medicine

## 2021-05-28 ENCOUNTER — Telehealth: Payer: Self-pay | Admitting: Internal Medicine

## 2021-05-28 DIAGNOSIS — E119 Type 2 diabetes mellitus without complications: Secondary | ICD-10-CM | POA: Diagnosis not present

## 2021-05-28 DIAGNOSIS — E782 Mixed hyperlipidemia: Secondary | ICD-10-CM | POA: Diagnosis not present

## 2021-05-28 NOTE — Telephone Encounter (Signed)
Inbound call from patient inquiring about ultrasound results. Best contact number [phone]. States it is okay to leave detailed message

## 2021-05-28 NOTE — Assessment & Plan Note (Addendum)
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

## 2021-05-28 NOTE — Assessment & Plan Note (Signed)
?   Onset in pt with h/o sinus problems and freq cough  Dating back at least to 2015 @ initial [facility] eval  - Allergy profile 04/29/2021 > Eos 0.1 /  IgE  6 - - The proper method of use, as well as anticipated side effects, of a metered-dose inhaler were discussed and demonstrated to the patient using teach back method. Improved effectiveness after extensive coaching during this visit to a level of approximately 75 % from a baseline of 50 % so rx use saba prn   Difficult to exclude but not likely the cause for chronic cough/ rec keep saba on hand  I spent extra time with pt today reviewing appropriate use of albuterol for prn use on exertion with the following points: 1) saba is for relief of sob that does not improve by walking a slower pace or resting but rather if the pt does not improve after trying this first. 2) If the pt is convinced, as many are, that saba helps recover from activity faster then it's easy to tell if this is the case by re-challenging : ie stop, take the inhaler, then p 5 minutes try the exact same activity (intensity of workload) that just caused the symptoms and see if they are substantially diminished or not after saba 3) if there is an activity that reproducibly causes the symptoms, try the saba 15 min before the activity on alternate days   If in fact the saba really does help, then fine to continue to use it prn but advised may need to look closer at the maintenance regimen being used to achieve better control of airways disease with exertion.           Each maintenance medication was reviewed in detail including emphasizing most importantly the difference between maintenance and prns and under what circumstances the prns are to be triggered using an action plan format where appropriate.  Total time for H and P, chart review, counseling, reviewing hfa  device(s) and generating customized AVS unique to this office visit / same day charting = 22 min

## 2021-05-28 NOTE — Telephone Encounter (Signed)
I left a message that Dr. Carlean Purl is out of the office and we will call with results once the [place] has been reviewed

## 2021-06-02 DIAGNOSIS — Z0000 Encounter for general adult medical examination without abnormal findings: Secondary | ICD-10-CM | POA: Diagnosis not present

## 2021-06-02 DIAGNOSIS — F329 Major depressive disorder, single episode, unspecified: Secondary | ICD-10-CM | POA: Diagnosis not present

## 2021-06-02 DIAGNOSIS — R221 Localized swelling, mass and lump, neck: Secondary | ICD-10-CM | POA: Diagnosis not present

## 2021-06-02 DIAGNOSIS — C8300 Small cell B-cell lymphoma, unspecified site: Secondary | ICD-10-CM | POA: Diagnosis not present

## 2021-06-02 DIAGNOSIS — R82998 Other abnormal findings in urine: Secondary | ICD-10-CM | POA: Diagnosis not present

## 2021-06-02 DIAGNOSIS — G4733 Obstructive sleep apnea (adult) (pediatric): Secondary | ICD-10-CM | POA: Diagnosis not present

## 2021-06-02 DIAGNOSIS — Z1212 Encounter for screening for malignant neoplasm of rectum: Secondary | ICD-10-CM | POA: Diagnosis not present

## 2021-06-02 DIAGNOSIS — E119 Type 2 diabetes mellitus without complications: Secondary | ICD-10-CM | POA: Diagnosis not present

## 2021-06-02 DIAGNOSIS — K7460 Unspecified cirrhosis of liver: Secondary | ICD-10-CM | POA: Diagnosis not present

## 2021-06-02 DIAGNOSIS — G629 Polyneuropathy, unspecified: Secondary | ICD-10-CM | POA: Diagnosis not present

## 2021-06-02 DIAGNOSIS — K760 Fatty (change of) liver, not elsewhere classified: Secondary | ICD-10-CM | POA: Diagnosis not present

## 2021-06-02 DIAGNOSIS — I10 Essential (primary) hypertension: Secondary | ICD-10-CM | POA: Diagnosis not present

## 2021-06-02 DIAGNOSIS — R188 Other ascites: Secondary | ICD-10-CM | POA: Diagnosis not present

## 2021-06-03 NOTE — Progress Notes (Signed)
Sent message, via epic in basket, requesting orders in epic from surgeon.  

## 2021-06-04 NOTE — Patient Instructions (Addendum)
DUE TO COVID-19 ONLY ONE VISITOR IS ALLOWED TO COME WITH YOU AND STAY IN THE WAITING ROOM ONLY DURING PRE OP AND PROCEDURE DAY OF SURGERY IF YOU ARE GOING HOME AFTER SURGERY. IF YOU ARE SPENDING THE NIGHT 2 PEOPLE MAY VISIT WITH YOU IN YOUR PRIVATE ROOM AFTER SURGERY UNTIL VISITING  HOURS ARE OVER AT 800 PM AND THE 2 VISITORS CANNOT SPEND THE NIGHT.                Janice [name]     Your procedure is scheduled on: 06/24/21   Report to [facility] Main  Entrance   Report to admitting at   8:15 AM     Call this number if you have problems the morning of surgery [phone]    Remember: Do not eat food  :After Midnight the night before your surgery,   You may have clear liquids from midnight until --7:30 am    CLEAR LIQUID DIET   Foods Allowed                                                                     Foods Excluded  Coffee and tea, regular and decaf                             liquids that you cannot  Plain Jell-O any favor except red or purple                                           see through such as: Fruit ices (not with fruit pulp)                                     milk, soups, orange juice  Iced Popsicles                                    All solid food Carbonated beverages, regular and diet                                    Cranberry, grape and apple juices Sports drinks like Gatorade Lightly seasoned clear broth or consume(fat free) Sugar   BRUSH YOUR TEETH MORNING OF SURGERY AND RINSE YOUR MOUTH OUT, NO CHEWING GUM CANDY OR MINTS.     Take these medicines the morning of surgery with A SIP OF WATER: gabapentin, Paxil, Amlodipine, Omeprazole use your inhaler and bring it with you .  Stop taking ___________on __________as instructed by _____________.  Stop taking ____________as directed by your Surgeon/Cardiologist.  Contact your Surgeon/Cardiologist for instructions on Anticoagulant Therapy prior to surgery.Call [facility] and [facility] about  when to stop it             How to Manage Your Diabetes Before and After Surgery  Why is it important to control my blood sugar before and after surgery? Improving  blood sugar levels before and after surgery helps healing and can limit problems. A way of improving blood sugar control is eating a healthy diet by:  Eating less sugar and carbohydrates  Increasing activity/exercise  Talking with your doctor about reaching your blood sugar goals High blood sugars (greater than 180 mg/dL) can raise your risk of infections and slow your recovery, so you will need to focus on controlling your diabetes during the weeks before surgery. Make sure that the doctor who takes care of your diabetes knows about your planned surgery including the date and location.  How do I manage my blood sugar before surgery? Check your blood sugar at least 4 times a day, starting 2 days before surgery, to make sure that the level is not too high or low. Check your blood sugar the morning of your surgery when you wake up and every 2 hours until you get to the Short Stay unit. If your blood sugar is less than 70 mg/dL, you will need to treat for low blood sugar: Do not take insulin. Treat a low blood sugar (less than 70 mg/dL) with  cup of clear juice (cranberry or apple), 4 glucose tablets, OR glucose gel. Recheck blood sugar in 15 minutes after treatment (to make sure it is greater than 70 mg/dL). If your blood sugar is not greater than 70 mg/dL on recheck, call [phone] for further instructions. Report your blood sugar to the short stay nurse when you get to Short Stay.  If you are admitted to the hospital after surgery: Your blood sugar will be checked by the staff and you will probably be given insulin after surgery (instead of oral diabetes medicines) to make sure you have good blood sugar levels. The goal for blood sugar control after surgery is 80-180 mg/dL.   WHAT DO I DO ABOUT MY DIABETES MEDICATION? Day  before surgery:  Am dose day before surgery take 45 Units of novolin N Novolin R take usual dose in am.  Lunch dose take 30 units of Novolin  Night dose of Novolin N take 22 units No Novolin R at night  Do not take oral diabetes medicines (pills) the morning of surgery.       THE MORNING OF SURGERY, take 22  units of   novolin N   insulin.   If your CBG is greater than 220 mg/dL, you may take  of your sliding scale  (correction) dose of insulin.                   You may not have any metal on your body including hair pins and              piercings  Do not wear jewelry, make-up, lotions, powders or perfumes, deodorant             Do not wear nail polish on your fingernails.  Do not shave  48 hours prior to surgery.                 Do not bring valuables to the hospital. [place].  Contacts, dentures or bridgework may not be worn into surgery.   Patients discharged the day of surgery will not be allowed to drive home. IF YOU ARE HAVING SURGERY AND GOING HOME THE SAME DAY, YOU MUST HAVE AN ADULT TO DRIVE YOU HOME AND BE WITH  YOU FOR 24 HOURS. YOU MAY GO HOME BY TAXI OR UBER OR ORTHERWISE, BUT AN ADULT MUST ACCOMPANY YOU HOME AND STAY WITH YOU FOR 24 HOURS.  Name and phone number of your driver:  Special Instructions: N/A              Please read over the following fact sheets you were given: _____________________________________________________________________             [facility] - Preparing for Surgery Before surgery, you can play an important role.  Because skin is not sterile, your skin needs to be as free of germs as possible.  You can reduce the number of germs on your skin by washing with CHG (chlorahexidine gluconate) soap before surgery.  CHG is an antiseptic cleaner which kills germs and bonds with the skin to continue killing germs even after washing. Please DO NOT use if you have an allergy to CHG or antibacterial  soaps.  If your skin becomes reddened/irritated stop using the CHG and inform your nurse when you arrive at Short Stay. Do not shave (including legs and underarms) for at least 48 hours prior to the first CHG shower.  You may shave your face/neck. Please follow these instructions carefully:  1.  Shower with CHG Soap the night before surgery and the  morning of Surgery.  2.  If you choose to wash your hair, wash your hair first as usual with your  normal  shampoo.  3.  After you shampoo, rinse your hair and body thoroughly to remove the  shampoo.                            4.  Use CHG as you would any other liquid soap.  You can apply chg directly  to the skin and wash                       Gently with a scrungie or clean washcloth.  5.  Apply the CHG Soap to your body ONLY FROM THE NECK DOWN.   Do not use on face/ open                           Wound or open sores. Avoid contact with eyes, ears mouth and genitals (private parts).                       Wash face,  Genitals (private parts) with your normal soap.             6.  Wash thoroughly, paying special attention to the area where your surgery  will be performed.  7.  Thoroughly rinse your body with warm water from the neck down.  8.  DO NOT shower/wash with your normal soap after using and rinsing off  the CHG Soap.                9.  Pat yourself dry with a clean towel.            10.  Wear clean pajamas.            11.  Place clean sheets on your bed the night of your first shower and do not  sleep with pets. Day of Surgery : Do not apply any lotions/deodorants the morning of surgery.  Please wear clean clothes to the hospital/surgery center.  FAILURE  TO FOLLOW THESE INSTRUCTIONS MAY RESULT IN THE CANCELLATION OF YOUR SURGERY PATIENT SIGNATURE_________________________________  NURSE SIGNATURE__________________________________  ________________________________________________________________________

## 2021-06-07 ENCOUNTER — Other Ambulatory Visit: Payer: Self-pay

## 2021-06-07 ENCOUNTER — Encounter (HOSPITAL_COMMUNITY)
Admission: RE | Admit: 2021-06-07 | Discharge: 2021-06-07 | Disposition: A | Discharge status: Home / Self Care | Payer: HMO | Source: Ambulatory Visit | Attending: General Surgery | Admitting: General Surgery

## 2021-06-07 ENCOUNTER — Encounter (HOSPITAL_COMMUNITY): Payer: Self-pay

## 2021-06-07 DIAGNOSIS — Z01812 Encounter for preprocedural laboratory examination: Secondary | ICD-10-CM | POA: Diagnosis not present

## 2021-06-07 DIAGNOSIS — Z79899 Other long term (current) drug therapy: Secondary | ICD-10-CM | POA: Insufficient documentation

## 2021-06-07 LAB — CBC
HCT: 38.7 % (ref 36.0–46.0)
Hemoglobin: 11.9 g/dL — ABNORMAL LOW (ref 12.0–15.0)
MCH: 27.1 pg (ref 26.0–34.0)
MCHC: 30.7 g/dL (ref 30.0–36.0)
MCV: 88.2 fL (ref 80.0–100.0)
Platelets: 105 10*3/uL — ABNORMAL LOW (ref 150–400)
RBC: 4.39 MIL/uL (ref 3.87–5.11)
RDW: 15.1 % (ref 11.5–15.5)
WBC: 17.5 10*3/uL — ABNORMAL HIGH (ref 4.0–10.5)
nRBC: 0 % (ref 0.0–0.2)

## 2021-06-07 LAB — COMPREHENSIVE METABOLIC PANEL
ALT: 25 U/L (ref 0–44)
AST: 22 U/L (ref 15–41)
Albumin: 4.5 g/dL (ref 3.5–5.0)
Alkaline Phosphatase: 67 U/L (ref 38–126)
Anion gap: 6 (ref 5–15)
BUN: 15 mg/dL (ref 8–23)
CO2: 27 mmol/L (ref 22–32)
Calcium: 9.1 mg/dL (ref 8.9–10.3)
Chloride: 105 mmol/L (ref 98–111)
Creatinine, Ser: 0.67 mg/dL (ref 0.44–1.00)
GFR, Estimated: >60 mL/min (ref >60)
Glucose, Bld: 141 mg/dL — ABNORMAL HIGH (ref 70–99)
Potassium: 4.9 mmol/L (ref 3.5–5.1)
Sodium: 138 mmol/L (ref 135–145)
Total Bilirubin: 0.9 mg/dL (ref 0.3–1.2)
Total Protein: 6.7 g/dL (ref 6.5–8.1)

## 2021-06-07 LAB — HEMOGLOBIN A1C
Hgb A1c MFr Bld: 7.5 % — ABNORMAL HIGH (ref 4.8–5.6)
Mean Plasma Glucose: 168.55 mg/dL

## 2021-06-07 NOTE — Progress Notes (Addendum)
COVID test- NA    PCP - Dr. Alfonso Patten. [facility] 06/02/21 Pulmonologist- Dr. Melvyn Novas LOV 05/28/21 Cardiology- Dr. Virgina Jock LOV 03/08/21  Chest x-ray - no EKG - 03/08/21-epic Stress Test - no ECHO - 02/03/21-epic Cardiac Cath - no Pacemaker/ICD device last checked:NA  Sleep Study - yes CPAP - yes  Fasting Blood Sugar - 63-280 Checks Blood Sugar __4___ times a day  Blood Thinner Instructions:ASA 81 mg/ Dr. Dagmar Hait Aspirin Instructions:none . Pt will call [facility] and [facility] Last Dose:  Anesthesia review: yes  Patient denies shortness of breath, fever, cough and chest pain at PAT appointment Pt doesn't climb stairs. She had a lung infection in 8/ 2022 But is using her inhaler. She has needed a breathing treatment with other surgeries.  Patient verbalized understanding of instructions that were given to them at the PAT appointment. Patient was also instructed that they will need to review over the PAT instructions again at home before surgery. Yes  Pt will call Dr. Dagmar Hait and ask about how to manage her insulins before surgery.

## 2021-06-09 ENCOUNTER — Encounter: Payer: Self-pay | Admitting: Internal Medicine

## 2021-06-09 ENCOUNTER — Other Ambulatory Visit: Payer: Self-pay

## 2021-06-09 ENCOUNTER — Ambulatory Visit (AMBULATORY_SURGERY_CENTER): Payer: HMO | Admitting: Internal Medicine

## 2021-06-09 VITALS — BP 114/48 | HR 65 | Temp 98.4°F | Resp 14 | Ht 64.5 in | Wt 221.0 lb

## 2021-06-09 DIAGNOSIS — K319 Disease of stomach and duodenum, unspecified: Secondary | ICD-10-CM | POA: Diagnosis not present

## 2021-06-09 DIAGNOSIS — K7460 Unspecified cirrhosis of liver: Secondary | ICD-10-CM | POA: Diagnosis not present

## 2021-06-09 DIAGNOSIS — E669 Obesity, unspecified: Secondary | ICD-10-CM | POA: Diagnosis not present

## 2021-06-09 DIAGNOSIS — F32A Depression, unspecified: Secondary | ICD-10-CM | POA: Diagnosis not present

## 2021-06-09 DIAGNOSIS — J45909 Unspecified asthma, uncomplicated: Secondary | ICD-10-CM | POA: Diagnosis not present

## 2021-06-09 DIAGNOSIS — K7581 Nonalcoholic steatohepatitis (NASH): Secondary | ICD-10-CM

## 2021-06-09 DIAGNOSIS — G4733 Obstructive sleep apnea (adult) (pediatric): Secondary | ICD-10-CM | POA: Diagnosis not present

## 2021-06-09 DIAGNOSIS — E119 Type 2 diabetes mellitus without complications: Secondary | ICD-10-CM | POA: Diagnosis not present

## 2021-06-09 MED ORDER — SODIUM CHLORIDE 0.9 % IV SOLN: 500.0000 mL | INTRAVENOUS | Status: DC

## 2021-06-09 NOTE — Progress Notes (Signed)
Pt's states no medical or surgical changes since previsit or office visit. 

## 2021-06-09 NOTE — Op Note (Signed)
[facility] Patient Name: Janice [name] Procedure Date: 06/09/2021 8:57 AM MRN: [id number] Endoscopist: Gatha Mayer , MD Age: 70 Referring MD:  Date of Birth: 12-13-1950 Gender: Female Account #: [id number] Procedure:                Upper GI endoscopy Indications:              Cirrhosis rule out esophageal varices Medicines:                Propofol per Anesthesia, Monitored Anesthesia Care Procedure:                Pre-Anesthesia Assessment:                           - Prior to the procedure, a History and Physical                            was performed, and patient medications and                            allergies were reviewed. The patient's tolerance of                            previous anesthesia was also reviewed. The risks                            and benefits of the procedure and the sedation                            options and risks were discussed with the patient.                            All questions were answered, and informed consent                            was obtained. Prior Anticoagulants: The patient has                            taken no previous anticoagulant or antiplatelet                            agents. ASA Grade Assessment: III - A patient with                            severe systemic disease. After reviewing the risks                            and benefits, the patient was deemed in                            satisfactory condition to undergo the procedure.                           After obtaining informed consent, the endoscope was  passed under direct vision. Throughout the                            procedure, the patient's blood pressure, pulse, and                            oxygen saturations were monitored continuously. The                            GIF [id number] #[id number] was introduced through the                            mouth, and advanced to the second part of duodenum.                             The upper GI endoscopy was accomplished without                            difficulty. The patient tolerated the procedure                            well. Scope In: Scope Out: Findings:                 The examined esophagus was normal.                           A few small papules (nodules) were found in the                            prepyloric region of the stomach. Biopsies were                            taken with a cold forceps for histology.                            Verification of patient identification for the                            specimen was done. Estimated blood loss was minimal.                           Patchy mildly erythematous mucosa was found in the                            gastric fundus, in the gastric body and in the                            gastric antrum.                           The exam was otherwise without abnormality.                           The cardia and gastric fundus were normal  on                            retroflexion. Complications:            No immediate complications. Estimated Blood Loss:     Estimated blood loss was minimal. Impression:               - Normal esophagus.                           - A few papules (nodules) found in the stomach. One                            with superficial erosion. ? inflammatory, ? related                            to portal htn? Biopsied.                           - Erythematous mucosa in the gastric fundus,                            gastric body and antrum.                           - The examination was otherwise normal. Recommendation:           - Patient has a contact number available for                            emergencies. The signs and symptoms of potential                            delayed complications were discussed with the                            patient. Return to normal activities tomorrow.                            Written discharge instructions were provided to  the                            patient.                           - Resume previous diet.                           - Continue present medications.                           - Await pathology results.                           - I have messaged Dr. Irene Limbo for input on next  imaging steps (liver and splenic hyperechoic                            lesions). She is allergic to IV contrast and has                            claustrophobia, which impact choices Gatha Mayer, MD 06/09/2021 10:31:04 AM This report has been signed electronically.

## 2021-06-09 NOTE — Progress Notes (Signed)
[facility] History and Physical   Primary Care Physician:  Prince Solian, MD   Reason for Procedure:   Screen for varices  Plan:    EGD     HPI: Janice [name] is a 70 y.o. female w/ NASH cirrhosis here to screen for esophageal varices   Past Medical History:  Diagnosis Date   Allergy    Asthma    seasonal   Cellulitis 2013   Left toe   Cirrhosis ([place])    Common migraine    History of   Complication of anesthesia    Per pt/had breathing problems with "block" during rotator cuff surgery. 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[place])     Past Surgical History:  Procedure Laterality Date   ANKLE SURGERY     Left    COLONOSCOPY  05/2018   LEEP N/A 09/14/2018   Procedure: LOOP ELECTROSURGICAL EXCISION PROCEDURE (LEEP);  Surgeon: Arvella Nigh, MD;  Location: [facility];  Service: Gynecology;  Laterality: N/A;   ROTATOR CUFF REPAIR Bilateral 2012, 2015   TONSILLECTOMY     TOTAL KNEE ARTHROPLASTY Left 04/08/2019   Procedure: TOTAL KNEE ARTHROPLASTY;  Surgeon: Elsie Saas, MD;  Location: [facility];  Service: Orthopedics;  Laterality: Left;   UPPER GI ENDOSCOPY  05/2018    Prior to Admission medications   Medication Sig Start Date End Date Taking? Authorizing Provider  albuterol (PROVENTIL HFA;VENTOLIN HFA) 108 (90 Base) MCG/ACT inhaler Inhale 1-2 puffs  into the lungs every 6 (six) hours as needed (Cough).   Yes [provider]  amLODipine (NORVASC) 10 MG tablet Take 10 mg by mouth daily. 02/16/20  Yes [provider]  aspirin EC 81 MG tablet Take 1 tablet (81 mg total) by mouth daily. Swallow whole. 03/09/20  Yes Patwardhan, Manish J, MD  azelastine (ASTELIN) 0.1 % nasal spray Place 1 spray into both nostrils 2 (two) times daily. Use in each nostril as directed 04/29/21 07/28/21 Yes Tanda Rockers, MD  Calcium-Magnesium-Zinc (CAL-MAG-ZINC PO) Take 2 tablets by mouth daily.   Yes [provider]  Carboxymethylcellul-Glycerin (REFRESH OPTIVE PF OP) Place 1 drop into both eyes daily as needed (dry eyes). Non-pres   Yes [provider]  Coenzyme Q10 (CO Q-10) 100 MG CAPS Take 100 mg by mouth daily.   Yes [provider]  fluticasone (FLONASE) 50 MCG/ACT nasal spray Place 1 spray into both nostrils daily.   Yes [provider]  gabapentin (NEURONTIN) 100 MG capsule Take 1 capsule (100 mg total) by mouth daily. One three times daily Patient taking differently: Take 600 mg by mouth daily with supper. 05/27/21  Yes Tanda Rockers, MD  insulin NPH Human (HUMULIN N,NOVOLIN N) 100 UNIT/ML injection Inject  40-75 Units into the skin See admin instructions. Inject 45 units subcutaneously at breakfast, 30 units at lunch & 45 units at supper   Yes [provider]  insulin regular (NOVOLIN R,HUMULIN R) 100 units/mL injection Inject 30 Units into the skin 3 (three) times daily before meals.   Yes [provider]  irbesartan (AVAPRO) 300 MG tablet Take 300 mg by mouth daily.   Yes [provider]  metFORMIN (GLUCOPHAGE-XR) 500 MG 24 hr tablet Take 1,000 mg by mouth 2 (two) times daily.   Yes [provider]  omeprazole (PRILOSEC) 40 MG capsule Take 40 mg by mouth 2 (two) times daily. 03/11/17  Yes [provider]  PARoxetine (PAXIL) 10 MG tablet Take 10 mg by mouth daily.  06/08/21  Yes [provider]  Probiotic Product (PROBIOTIC DAILY PO) Take 1 tablet by mouth daily.   Yes [provider]  rosuvastatin (CRESTOR) 10 MG tablet Take 1 tablet (10 mg total) by mouth daily. Patient taking differently: Take 10 mg by mouth daily with supper. 03/08/21  Yes Cantwell, Celeste C, PA-C  spironolactone (ALDACTONE) 50 MG tablet Take 1 tablet (50 mg total) by mouth daily. 04/05/21  Yes Levin Erp, PA  vitamin C (ASCORBIC ACID) 500 MG tablet Take 500 mg by mouth 2 (two) times a day.    Yes [provider]  chlorpheniramine (CHLOR-TRIMETON) 4 MG tablet Take 4 mg by mouth 2 (two) times daily as needed for allergies. Patient not taking: Reported on 06/09/2021    [provider]  desoximetasone (TOPICORT) 0.25 % cream Apply 1 application topically 4 (four) times daily as needed (rash).    [provider]  diclofenac sodium (VOLTAREN) 1 % GEL Apply 2 g topically 2 (two) times daily as needed (knee pain/ foot /Triger finger). 03/27/18   [provider]  terconazole (TERAZOL 3) 0.8 % vaginal cream Place 1 applicator vaginally as needed (yeast infections).     [provider]    Current Outpatient Medications  Medication Sig Dispense Refill   albuterol (PROVENTIL HFA;VENTOLIN HFA) 108 (90 Base) MCG/ACT inhaler Inhale 1-2 puffs into the lungs every 6 (six) hours as needed (Cough).     amLODipine (NORVASC) 10 MG tablet Take 10 mg by mouth daily.     aspirin EC 81 MG tablet Take 1 tablet (81 mg total) by mouth daily. Swallow whole. 90 tablet 3   azelastine (ASTELIN) 0.1 % nasal spray Place 1 spray into both nostrils 2 (two) times daily. Use in each nostril as directed 90 mL 3   Calcium-Magnesium-Zinc (CAL-MAG-ZINC PO) Take 2 tablets by mouth daily.     Carboxymethylcellul-Glycerin (REFRESH OPTIVE PF OP) Place 1 drop into both eyes daily as needed (dry eyes). Non-pres     Coenzyme Q10 (CO Q-10) 100 MG CAPS Take 100 mg  by mouth daily.     fluticasone (FLONASE) 50 MCG/ACT nasal spray Place 1 spray into both nostrils daily.     gabapentin (NEURONTIN) 100 MG capsule Take 1 capsule (100 mg total) by mouth daily. One three times daily (Patient taking differently: Take 600 mg by mouth daily with supper.) 90 capsule 2   insulin NPH Human (HUMULIN N,NOVOLIN N) 100 UNIT/ML injection Inject 40-75 Units into the skin See admin instructions. Inject 45 units subcutaneously at breakfast, 30 units at lunch & 45 units at supper     insulin regular (NOVOLIN R,HUMULIN R) 100 units/mL injection Inject 30 Units into the skin 3 (three) times daily  before meals.     irbesartan (AVAPRO) 300 MG tablet Take 300 mg by mouth daily.     metFORMIN (GLUCOPHAGE-XR) 500 MG 24 hr tablet Take 1,000 mg by mouth 2 (two) times daily.     omeprazole (PRILOSEC) 40 MG capsule Take 40 mg by mouth 2 (two) times daily.     PARoxetine (PAXIL) 10 MG tablet Take 10 mg by mouth daily.     Probiotic Product (PROBIOTIC DAILY PO) Take 1 tablet by mouth daily.     rosuvastatin (CRESTOR) 10 MG tablet Take 1 tablet (10 mg total) by mouth daily. (Patient taking differently: Take 10 mg by mouth daily with supper.) 90 tablet 3   spironolactone (ALDACTONE) 50 MG tablet Take 1 tablet (50 mg total) by mouth daily. 90 tablet 0   vitamin C (ASCORBIC ACID) 500 MG tablet Take 500 mg by mouth 2 (two) times a day.      chlorpheniramine (CHLOR-TRIMETON) 4 MG tablet Take 4 mg by mouth 2 (two) times daily as needed for allergies. (Patient not taking: Reported on 06/09/2021)     desoximetasone (TOPICORT) 0.25 % cream Apply 1 application topically 4 (four) times daily as needed (rash).     diclofenac sodium (VOLTAREN) 1 % GEL Apply 2 g topically 2 (two) times daily as needed (knee pain/ foot /Triger finger).  2   terconazole (TERAZOL 3) 0.8 % vaginal cream Place 1 applicator vaginally as needed (yeast infections).      Current Facility-Administered Medications  Medication Dose  Route Frequency Provider Last Rate Last Admin   0.9 %  sodium chloride infusion  500 mL Intravenous Continuous Gatha Mayer, MD        Allergies as of 06/09/2021 - Review Complete 06/09/2021  Allergen Reaction Noted   Benzonatate Anaphylaxis and Hives 12/06/2016   Fluticasone furoate-vilanterol Anaphylaxis and Hives 02/01/2019   Iodine Hives, Swelling, and Other (See Comments) 07/19/2016   Latex Hives, Itching, Rash, and Other (See Comments) 09/28/2013   Simvastatin Other (See Comments) 01/12/2013   Bupropion Other (See Comments) 01/12/2013   Levofloxacin Other (See Comments) 12/30/2013   Penicillins Hives 01/27/2020   Povidone-iodine Hives 06/30/2016   Amoxicillin Hives 08/27/2018   Chlorthalidone Other (See Comments) 03/09/2020   Diflucan [fluconazole] Hives and Other (See Comments) 08/27/2018   Amoxicillin-pot clavulanate Rash 02/03/2021   Doxycycline Itching and Diarrhea 12/30/2013    Family History  Problem Relation Age of Onset   Breast cancer Mother    Bone cancer Mother    Heart failure Father    Diabetes Mellitus II Father    Hypertension Father    Diabetes Mellitus II Sister    Obesity Sister    Other Sister        retina problem   Hypertension Brother    Diabetes Mellitus II Brother    Colon cancer Neg Hx    Rectal cancer Neg Hx    Stomach cancer Neg Hx    Esophageal cancer Neg Hx    Pancreatic cancer Neg Hx    Liver disease Neg Hx     Social History   Socioeconomic History   Marital status: Widowed    Spouse name: Not on file   Number of children: 0   Years of education: 3 years college   Highest education level: Not on file  Occupational History   Occupation: Unemployed  Tobacco Use   Smoking status: Never   Smokeless tobacco: Never  Vaping Use   Vaping Use: Never used  Substance  and Sexual Activity   Alcohol use: Not Currently    Comment: very rare   Drug use: No   Sexual activity: Not Currently    Birth control/protection: None,  Post-menopausal  Other Topics Concern   Not on file  Social History Narrative   1 caffeine drink daily    Right-handed   Lives at home with husband.   Social Determinants of Health   Financial Resource Strain: Medium Risk   Difficulty of Paying Living Expenses: Somewhat hard  Food Insecurity: No Food Insecurity   Worried About Charity fundraiser in the Last Year: Never true   Ran Out of Food in the Last Year: Never true  Transportation Needs: No Transportation Needs   Lack of Transportation (Medical): No   Lack of Transportation (Non-Medical): No  Physical Activity: Not on file  Stress: Not on file  Social Connections: Socially Integrated   Frequency of Communication with Friends and Family: More than three times a week   Frequency of Social Gatherings with Friends and Family: More than three times a week   Attends Religious Services: More than 4 times per year   Active Member of [employer] or Organizations: Yes   Attends Music therapist: More than 4 times per year   Marital Status: Married  Human resources officer Violence: Not on file    Review of Systems:  other review of systems negative except as mentioned in the HPI.  Physical Exam: Vital signs BP (!) 133/49   Pulse 67   Temp 98.4 F (36.9 C) (Temporal)   Resp 13   Ht 5' 4.5" (1.638 m)   Wt 221 lb (100.2 kg)   SpO2 100%   BMI 37.35 kg/m   General:   Alert,  Well-developed, well-nourished, pleasant and cooperative in NAD Lungs:  Clear throughout to auscultation.   Heart:  Regular rate and rhythm; no murmurs, clicks, rubs,  or gallops. Abdomen:  Soft, nontender and nondistended. Normal bowel sounds.   Neuro/Psych:  Alert and cooperative. Normal mood and affect. A and O x 3   @Christeen Lai  Simonne Maffucci, MD, [facility] [phone] (pager) 06/09/2021 10:13 AM@

## 2021-06-09 NOTE — Progress Notes (Signed)
Called to room to assist during endoscopic procedure.  Patient ID and intended procedure confirmed with present staff. Received instructions for my participation in the procedure from the performing physician.  

## 2021-06-09 NOTE — Progress Notes (Signed)
Pt in recovery with monitors in place, VSS. Report given to receiving RN. Bite guard was placed with pt awake to ensure comfort. No dental or soft tissue damage noted. 

## 2021-06-09 NOTE — Patient Instructions (Addendum)
I did not see swollen esophageal veins (varices) and  that is good news.  I di see some nodules in the stomach and took biopsies to understand what they are.  I will let you know and I am messaging Dr. Irene Limbo about the next step to look at the liver and spleen.  I appreciate the opportunity to care for you.  Gatha Mayer, MD, [facility]  Read all of the handouts given to you by your recovery room nurse.  YOU HAD AN ENDOSCOPIC PROCEDURE TODAY AT [facility]:   Refer to the procedure report that was given to you for any specific questions about what was found during the examination.  If the procedure report does not answer your questions, please call your gastroenterologist to clarify.  If you requested that your care partner not be given the details of your procedure findings, then the procedure report has been included in a sealed envelope for you to review at your convenience later.  YOU SHOULD EXPECT: Some feelings of bloating in the abdomen. Passage of more gas than usual.  Walking can help get rid of the air that was put into your GI tract during the procedure and reduce the bloating.   Please Note:  You might notice some irritation and congestion in your nose or some drainage.  This is from the oxygen used during your procedure.  There is no need for concern and it should clear up in a day or so.  SYMPTOMS TO REPORT IMMEDIATELY:   Following upper endoscopy (EGD)  Vomiting of blood or coffee ground material  New chest pain or pain under the shoulder blades  Painful or persistently difficult swallowing  New shortness of breath  Fever of 100F or higher  Black, tarry-looking stools  For urgent or emergent issues, a gastroenterologist can be reached at any hour by calling [phone]. Do not use MyChart messaging for urgent concerns.    DIET:  We do recommend a small meal at first, but then you may proceed to your regular diet.  Drink plenty of fluids but you should  avoid alcoholic beverages for 24 hours.  ACTIVITY:  You should plan to take it easy for the rest of today and you should NOT DRIVE or use heavy machinery until tomorrow (because of the sedation medicines used during the test).    FOLLOW UP: Our staff will call the number listed on your records 48-72 hours following your procedure to check on you and address any questions or concerns that you may have regarding the information given to you following your procedure. If we do not reach you, we will leave a message.  We will attempt to reach you two times.  During this call, we will ask if you have developed any symptoms of COVID 19. If you develop any symptoms (ie: fever, flu-like symptoms, shortness of breath, cough etc.) before then, please call [phone].  If you test positive for Covid 19 in the 2 weeks post procedure, please call and report this information to [place].    If any biopsies were taken you will be contacted by phone or by letter within the next 1-3 weeks.  Please call us at [phone] if you have not heard about the biopsies in 3 weeks.    SIGNATURES/CONFIDENTIALITY: You and/or your care partner have signed paperwork which will be entered into your electronic medical record.  These signatures attest to the fact that that the information above on your After Visit  Summary has been reviewed and is understood.  Full responsibility of the confidentiality of this discharge information lies with you and/or your care-partner.

## 2021-06-10 ENCOUNTER — Ambulatory Visit: Payer: Self-pay | Admitting: General Surgery

## 2021-06-11 ENCOUNTER — Telehealth: Payer: Self-pay | Admitting: *Deleted

## 2021-06-11 NOTE — Telephone Encounter (Signed)
  Follow up Call-  Call back number 06/09/2021  Post procedure Call Back phone  # [phone]  Permission to leave phone message Yes  Some recent data might be hidden     Patient questions:  Do you have a fever, pain , or abdominal swelling? No. Pain Score  0 *  Have you tolerated food without any problems? Yes.    Have you been able to return to your normal activities? Yes.    Do you have any questions about your discharge instructions: Diet   No. Medications  No. Follow up visit  No.  Do you have questions or concerns about your Care? No.  Actions: * If pain score is 4 or above: No action needed, pain <4.Have you developed a fever since your procedure? no  2.   Have you had an respiratory symptoms (SOB or cough) since your procedure? no  3.   Have you tested positive for COVID 19 since your procedure no  4.   Have you had any family members/close contacts diagnosed with the COVID 19 since your procedure?  no   If yes to any of these questions please route to Joylene John, RN and Joella Prince, RN

## 2021-06-15 NOTE — Progress Notes (Addendum)
Anesthesia Chart Review   Case: [id number] Date/Time: 06/24/21 0845   Procedure: EXCISION RIGHT POSTERIOR NECK MASS (Right)   Anesthesia type: Choice   Pre-op diagnosis: right posterior neck mass   Location: [place] [facility]   Surgeons: Kinsinger, [name], MD       DISCUSSION:70 y.o. 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 Gurney Maxin.   Pt last seen by pulmonology 05/27/2021. Stable at this visit.   Pt last seen by cardiology 03/08/2021, followed for carotid artery stenosis and aortic regurg. Stable at this visit with 6 month follow up recommended.  No progression of carotid stenosis on imaging, Echo 02/03/2021 with mild AR, MR, and TR.  VS: BP (!) 143/43   Pulse 68   Temp 36.9 C (Oral)   Resp 20   Ht 5' 4.5" (1.638 m)   Wt 98.9 kg   SpO2 99%   BMI 36.84 kg/m   PROVIDERS: Avva, Ravisankar, MD is PCP   Vernell Leep, MD is Cardiologist  LABS: Labs reviewed: Acceptable for surgery. (all labs ordered are listed, but only abnormal results are displayed)  Labs Reviewed  HEMOGLOBIN A1C - Abnormal; Notable for the following components:      Result Value   Hgb A1c MFr Bld 7.5 (*)    All other components within normal limits  CBC - Abnormal; Notable for the following components:   WBC 17.5 (*)    Hemoglobin 11.9 (*)    Platelets 105 (*)    All other components within normal limits  COMPREHENSIVE METABOLIC PANEL - Abnormal; Notable for the following components:   Glucose, Bld 141 (*)    All other components within normal limits     IMAGES:   EKG: 03/08/2021 Rate 76 bpm  Sinus rhythm  Consider old anterior infarct  CV: Echocardiogram 02/03/2021:  Normal LV systolic function with visual EF 60-65%. Left ventricle cavity  is normal in size. Mild to moderate left ventricular  hypertrophy. Normal  global wall motion. Doppler evidence of grade II (pseudonormal) diastolic dysfunction, elevated LAP.  Left atrial cavity is mildly dilated.  Mild (Grade I) aortic regurgitation.  Mild (Grade I) mitral regurgitation.  Mild tricuspid regurgitation. No evidence of pulmonary hypertension.  Compared to study dated 02/27/2019: G1DD is now G2DD, elevated LAP is new, moderate AR is now mild, otherwise no significant change.   Mack Stress Test 02/18/2019: Stress EKG is non-diagnostic, as this is pharmacological stress test. Normal myocardial perfusion. Stress LV EF: 50%. Low risk stress test. Past Medical History:  Diagnosis Date   Allergy    Asthma    seasonal   Cellulitis 2013   Left toe   Cirrhosis ([place])    Common migraine    History of   Complication of anesthesia    Per pt/had breathing problems with "block" during rotator cuff surgery. 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[place])     Past Surgical History:  Procedure Laterality Date   ANKLE SURGERY     Left    COLONOSCOPY  05/2018   LEEP N/A 09/14/2018   Procedure: LOOP ELECTROSURGICAL EXCISION PROCEDURE (LEEP);  Surgeon: Arvella Nigh, MD;  Location: [facility];  Service: Gynecology;  Laterality: N/A;   ROTATOR CUFF REPAIR Bilateral 2012, 2015   TONSILLECTOMY     TOTAL KNEE ARTHROPLASTY Left  04/08/2019   Procedure: TOTAL KNEE ARTHROPLASTY;  Surgeon: Elsie Saas, MD;  Location: [facility];  Service: Orthopedics;  Laterality: Left;   UPPER GI ENDOSCOPY  05/2018    MEDICATIONS:  albuterol (PROVENTIL HFA;VENTOLIN HFA) 108 (90 Base) MCG/ACT inhaler   amLODipine (NORVASC) 10 MG tablet   aspirin EC 81 MG tablet   azelastine (ASTELIN) 0.1 % nasal spray   Calcium-Magnesium-Zinc (CAL-MAG-ZINC PO)   Carboxymethylcellul-Glycerin (REFRESH OPTIVE PF OP)   chlorpheniramine (CHLOR-TRIMETON) 4 MG tablet   Coenzyme Q10 (CO Q-10) 100 MG CAPS   desoximetasone (TOPICORT) 0.25 % cream   diclofenac sodium (VOLTAREN) 1 % GEL   fluticasone (FLONASE) 50 MCG/ACT nasal spray   gabapentin (NEURONTIN) 100 MG capsule   insulin NPH Human (HUMULIN N,NOVOLIN N) 100 UNIT/ML injection   insulin regular (NOVOLIN R,HUMULIN R) 100 units/mL injection   irbesartan (AVAPRO) 300 MG tablet   metFORMIN (GLUCOPHAGE-XR) 500 MG 24 hr tablet   omeprazole (PRILOSEC) 40 MG capsule   PARoxetine (PAXIL) 10 MG tablet   Probiotic Product (PROBIOTIC DAILY PO)   rosuvastatin (CRESTOR) 10 MG tablet   spironolactone (ALDACTONE) 50 MG tablet   terconazole (TERAZOL 3) 0.8 % vaginal cream   vitamin C (ASCORBIC ACID) 500 MG tablet   No current facility-administered medications for this encounter.   Konrad Felix Ward, PA-C [facility] [phone]

## 2021-06-15 NOTE — Anesthesia Preprocedure Evaluation (Addendum)
Anesthesia Evaluation  Patient identified by MRN, date of birth, ID band Patient awake    Reviewed: Allergy & Precautions, NPO status , Patient's Chart, lab work & pertinent test results  History of Anesthesia Complications (+) history of anesthetic complications (issues w/ breathing, memory loss w/ rotator cuff surgery)  Airway Mallampati: II  TM Distance: >3 FB Neck ROM: Full    Dental  (+) Dental Advisory Given   Pulmonary sleep apnea and Continuous Positive Airway Pressure Ventilation ,    Pulmonary exam normal breath sounds clear to auscultation       Cardiovascular hypertension (150/76 in preop), Pt. on medications +CHF (grade 2 diastolic dysfunction)  Normal cardiovascular exam+ Valvular Problems/Murmurs (mild AI, mild MR) AI and MR  Rhythm:Regular Rate:Normal  Pt last seen by cardiology 03/08/2021, followed for carotid artery stenosis and aortic regurg. Stable at this visit with 6 month follow up recommended.  No progression of carotid stenosis on imaging, Echo 02/03/2021 with mild AR, MR, and TR.   Echocardiogram 02/03/2021:  Normal LV systolic function with visual EF 60-65%. Left ventricle cavity  is normal in size. Mild to moderate left ventricular hypertrophy. Normal  global wall motion. Doppler evidence of grade II (pseudonormal) diastolic dysfunction, elevated LAP.  Left atrial cavity is mildly dilated.  Mild (Grade I) aortic regurgitation.  Mild (Grade I) mitral regurgitation.  Mild tricuspid regurgitation. No evidence of pulmonary hypertension.  Compared to study dated 02/27/2019: G1DD is now G2DD, elevated LAP is new, moderate AR is now mild, otherwise no significant change   Neuro/Psych  Headaches, PSYCHIATRIC DISORDERS Depression  Neuromuscular disease (peripheral neuropathy 2/2 T2DM)    GI/Hepatic GERD  Medicated and Controlled,(+) Cirrhosis  (NASH)      ,   Endo/Other  diabetes, Well Controlled, Type 2,  Insulin Dependent, Oral Hypoglycemic AgentsObesity BMI 37 a1c 7.5 FS 165 in preop Last night took novolin 22U  Renal/GU negative Renal ROS  negative genitourinary   Musculoskeletal  (+) Arthritis , Osteoarthritis,    Abdominal (+) + obese,   Peds  Hematology CLL followed by oncology (last seen 05/18/2021)   Anesthesia Other Findings HOH  Reproductive/Obstetrics negative OB ROS                           Anesthesia Physical Anesthesia Plan  ASA: 3  Anesthesia Plan: General   Post-op Pain Management:    Induction: Intravenous  PONV Risk Score and Plan: 3 and Ondansetron, Dexamethasone, Midazolam and Treatment may vary due to age or medical condition  Airway Management Planned: Oral ETT  Additional Equipment: None  Intra-op Plan:   Post-operative Plan: Extubation in OR  Informed Consent: I have reviewed the patients History and Physical, chart, labs and discussed the procedure including the risks, benefits and alternatives for the proposed anesthesia with the patient or authorized representative who has indicated his/her understanding and acceptance.   Patient has DNR.  Discussed DNR with patient and Suspend DNR.   Dental advisory given  Plan Discussed with: CRNA  Anesthesia Plan Comments: (Pt brought paperwork with her for DNR, d/w pt that she wishes to suspend her DNR for the case but continue to withhold chest compressions and defibrillation )      Anesthesia Quick Evaluation

## 2021-06-17 ENCOUNTER — Telehealth: Payer: Self-pay | Admitting: Internal Medicine

## 2021-06-17 ENCOUNTER — Other Ambulatory Visit: Payer: Self-pay

## 2021-06-17 DIAGNOSIS — R935 Abnormal findings on diagnostic imaging of other abdominal regions, including retroperitoneum: Secondary | ICD-10-CM

## 2021-06-17 NOTE — Telephone Encounter (Signed)
Inbound call from patient requesting results and following up on referral for MRI

## 2021-06-17 NOTE — Telephone Encounter (Signed)
Order in [facility] for MR-abd. w & w/o @ [facility], w/open ended machine. Called them and they will contact the patient to schedule. Patient had creatine on 06/07/21

## 2021-06-17 NOTE — Telephone Encounter (Signed)
Returned patient's call and let her know the path results from her EGD were neg./benign. Also that Dr. Carlean Purl has consulted Dr. Irene Limbo in reference to her possible MR, for the best way to proceed (because of her IV contrast Alg. And claustrophobia)

## 2021-06-17 NOTE — Telephone Encounter (Signed)
Please tell her I did communicate with dr. Irene Limbo and we recommedn the following:  MR abdomen without and with contrast  She will need an open MRI Recent GFR is ok ? If needs another BUN/creat  Dx:   liver and spleen lesions on ultrasound small lymphocytic lymphoma Cirrhosis  Please rule out cancer

## 2021-06-24 ENCOUNTER — Encounter (HOSPITAL_COMMUNITY): Payer: Self-pay | Admitting: General Surgery

## 2021-06-24 ENCOUNTER — Ambulatory Visit (HOSPITAL_COMMUNITY): Payer: HMO | Admitting: Physician Assistant

## 2021-06-24 ENCOUNTER — Encounter (HOSPITAL_COMMUNITY)
Admission: RE | Disposition: A | Discharge status: Home / Self Care | Payer: Self-pay | Source: Home / Self Care | Attending: General Surgery

## 2021-06-24 ENCOUNTER — Ambulatory Visit (HOSPITAL_COMMUNITY)
Admission: RE | Admit: 2021-06-24 | Discharge: 2021-06-24 | Disposition: A | Discharge status: Home / Self Care | Payer: HMO | Attending: General Surgery | Admitting: General Surgery

## 2021-06-24 ENCOUNTER — Other Ambulatory Visit: Payer: Self-pay

## 2021-06-24 DIAGNOSIS — G4733 Obstructive sleep apnea (adult) (pediatric): Secondary | ICD-10-CM | POA: Diagnosis not present

## 2021-06-24 DIAGNOSIS — K7581 Nonalcoholic steatohepatitis (NASH): Secondary | ICD-10-CM | POA: Insufficient documentation

## 2021-06-24 DIAGNOSIS — D170 Benign lipomatous neoplasm of skin and subcutaneous tissue of head, face and neck: Secondary | ICD-10-CM | POA: Diagnosis not present

## 2021-06-24 DIAGNOSIS — Z79899 Other long term (current) drug therapy: Secondary | ICD-10-CM | POA: Insufficient documentation

## 2021-06-24 DIAGNOSIS — K219 Gastro-esophageal reflux disease without esophagitis: Secondary | ICD-10-CM | POA: Diagnosis not present

## 2021-06-24 DIAGNOSIS — Z881 Allergy status to other antibiotic agents status: Secondary | ICD-10-CM | POA: Insufficient documentation

## 2021-06-24 DIAGNOSIS — Z794 Long term (current) use of insulin: Secondary | ICD-10-CM | POA: Diagnosis not present

## 2021-06-24 DIAGNOSIS — I5032 Chronic diastolic (congestive) heart failure: Secondary | ICD-10-CM | POA: Diagnosis not present

## 2021-06-24 DIAGNOSIS — I110 Hypertensive heart disease with heart failure: Secondary | ICD-10-CM | POA: Insufficient documentation

## 2021-06-24 DIAGNOSIS — R221 Localized swelling, mass and lump, neck: Secondary | ICD-10-CM | POA: Diagnosis not present

## 2021-06-24 DIAGNOSIS — Z888 Allergy status to other drugs, medicaments and biological substances status: Secondary | ICD-10-CM | POA: Insufficient documentation

## 2021-06-24 DIAGNOSIS — E119 Type 2 diabetes mellitus without complications: Secondary | ICD-10-CM | POA: Insufficient documentation

## 2021-06-24 DIAGNOSIS — K429 Umbilical hernia without obstruction or gangrene: Secondary | ICD-10-CM | POA: Insufficient documentation

## 2021-06-24 DIAGNOSIS — Z9989 Dependence on other enabling machines and devices: Secondary | ICD-10-CM | POA: Diagnosis not present

## 2021-06-24 DIAGNOSIS — Z6838 Body mass index (BMI) 38.0-38.9, adult: Secondary | ICD-10-CM | POA: Insufficient documentation

## 2021-06-24 DIAGNOSIS — E669 Obesity, unspecified: Secondary | ICD-10-CM | POA: Insufficient documentation

## 2021-06-24 DIAGNOSIS — E785 Hyperlipidemia, unspecified: Secondary | ICD-10-CM | POA: Insufficient documentation

## 2021-06-24 DIAGNOSIS — Z883 Allergy status to other anti-infective agents status: Secondary | ICD-10-CM | POA: Diagnosis not present

## 2021-06-24 DIAGNOSIS — J45909 Unspecified asthma, uncomplicated: Secondary | ICD-10-CM | POA: Diagnosis not present

## 2021-06-24 DIAGNOSIS — Z91048 Other nonmedicinal substance allergy status: Secondary | ICD-10-CM | POA: Insufficient documentation

## 2021-06-24 DIAGNOSIS — Z880 Allergy status to penicillin: Secondary | ICD-10-CM | POA: Insufficient documentation

## 2021-06-24 DIAGNOSIS — Z91040 Latex allergy status: Secondary | ICD-10-CM | POA: Diagnosis not present

## 2021-06-24 DIAGNOSIS — Z7984 Long term (current) use of oral hypoglycemic drugs: Secondary | ICD-10-CM | POA: Insufficient documentation

## 2021-06-24 HISTORY — PX: MASS EXCISION: SHX2000

## 2021-06-24 LAB — GLUCOSE, CAPILLARY
Glucose-Capillary: 165 mg/dL — ABNORMAL HIGH (ref 70–99)
Glucose-Capillary: 149 mg/dL — ABNORMAL HIGH (ref 70–99)

## 2021-06-24 SURGERY — EXCISION MASS
Anesthesia: General | Site: Neck | Laterality: Right

## 2021-06-24 MED ORDER — ONDANSETRON HCL 4 MG/2ML IJ SOLN
INTRAMUSCULAR | Status: AC
  Filled 2021-06-24: qty 2

## 2021-06-24 MED ORDER — SUGAMMADEX SODIUM 500 MG/5ML IV SOLN
INTRAVENOUS | Status: AC
  Filled 2021-06-24: qty 5

## 2021-06-24 MED ORDER — ORAL CARE MOUTH RINSE: 15.0000 mL | Freq: Once | OROMUCOSAL | Status: AC

## 2021-06-24 MED ORDER — ACETAMINOPHEN 500 MG PO TABS
1000.0000 mg | ORAL_TABLET | ORAL | Status: AC
  Administered 2021-06-24: 1000 mg via ORAL
  Filled 2021-06-24: qty 2

## 2021-06-24 MED ORDER — CHLORHEXIDINE GLUCONATE CLOTH 2 % EX PADS: 6.0000 | MEDICATED_PAD | Freq: Once | CUTANEOUS | Status: DC

## 2021-06-24 MED ORDER — FENTANYL CITRATE (PF) 250 MCG/5ML IJ SOLN
INTRAMUSCULAR | Status: DC | PRN
  Administered 2021-06-24 (×2): 50 ug via INTRAVENOUS

## 2021-06-24 MED ORDER — FENTANYL CITRATE PF 50 MCG/ML IJ SOSY: 25.0000 ug | PREFILLED_SYRINGE | INTRAMUSCULAR | Status: DC | PRN

## 2021-06-24 MED ORDER — OXYCODONE HCL 5 MG PO TABS: 5.0000 mg | ORAL_TABLET | Freq: Once | ORAL | Status: DC | PRN

## 2021-06-24 MED ORDER — MIDAZOLAM HCL 2 MG/2ML IJ SOLN
INTRAMUSCULAR | Status: DC | PRN
  Administered 2021-06-24: 2 mg via INTRAVENOUS

## 2021-06-24 MED ORDER — DEXAMETHASONE SODIUM PHOSPHATE 10 MG/ML IJ SOLN
INTRAMUSCULAR | Status: AC
  Filled 2021-06-24: qty 1

## 2021-06-24 MED ORDER — BUPIVACAINE-EPINEPHRINE 0.25% -1:200000 IJ SOLN
INTRAMUSCULAR | Status: DC | PRN
  Administered 2021-06-24: 10 mL

## 2021-06-24 MED ORDER — PROPOFOL 10 MG/ML IV BOLUS
INTRAVENOUS | Status: DC | PRN
  Administered 2021-06-24: 150 mg via INTRAVENOUS

## 2021-06-24 MED ORDER — LIDOCAINE HCL (PF) 2 % IJ SOLN
INTRAMUSCULAR | Status: AC
  Filled 2021-06-24: qty 5

## 2021-06-24 MED ORDER — 0.9 % SODIUM CHLORIDE (POUR BTL) OPTIME
TOPICAL | Status: DC | PRN
  Administered 2021-06-24: 1000 mL

## 2021-06-24 MED ORDER — ROCURONIUM BROMIDE 10 MG/ML (PF) SYRINGE
PREFILLED_SYRINGE | INTRAVENOUS | Status: DC | PRN
  Administered 2021-06-24: 80 mg via INTRAVENOUS

## 2021-06-24 MED ORDER — PROPOFOL 10 MG/ML IV BOLUS
INTRAVENOUS | Status: AC
  Filled 2021-06-24: qty 20

## 2021-06-24 MED ORDER — LIDOCAINE 2% (20 MG/ML) 5 ML SYRINGE
INTRAMUSCULAR | Status: DC | PRN
  Administered 2021-06-24: 60 mg via INTRAVENOUS

## 2021-06-24 MED ORDER — ONDANSETRON HCL 4 MG/2ML IJ SOLN
INTRAMUSCULAR | Status: DC | PRN
  Administered 2021-06-24: 4 mg via INTRAVENOUS

## 2021-06-24 MED ORDER — CLINDAMYCIN PHOSPHATE 900 MG/50ML IV SOLN
900.0000 mg | INTRAVENOUS | Status: AC
  Administered 2021-06-24: 900 mg via INTRAVENOUS
  Filled 2021-06-24: qty 50

## 2021-06-24 MED ORDER — BUPIVACAINE-EPINEPHRINE (PF) 0.25% -1:200000 IJ SOLN
INTRAMUSCULAR | Status: AC
  Filled 2021-06-24: qty 30

## 2021-06-24 MED ORDER — LACTATED RINGERS IV SOLN: INTRAVENOUS | Status: DC

## 2021-06-24 MED ORDER — SUGAMMADEX SODIUM 500 MG/5ML IV SOLN
INTRAVENOUS | Status: DC | PRN
  Administered 2021-06-24: 400 mg via INTRAVENOUS

## 2021-06-24 MED ORDER — AMISULPRIDE (ANTIEMETIC) 5 MG/2ML IV SOLN
INTRAVENOUS | Status: AC
  Filled 2021-06-24: qty 4

## 2021-06-24 MED ORDER — CHLORHEXIDINE GLUCONATE 0.12 % MT SOLN
15.0000 mL | Freq: Once | OROMUCOSAL | Status: AC
  Administered 2021-06-24: 15 mL via OROMUCOSAL

## 2021-06-24 MED ORDER — MIDAZOLAM HCL 2 MG/2ML IJ SOLN
INTRAMUSCULAR | Status: AC
  Filled 2021-06-24: qty 2

## 2021-06-24 MED ORDER — ROCURONIUM BROMIDE 10 MG/ML (PF) SYRINGE
PREFILLED_SYRINGE | INTRAVENOUS | Status: AC
  Filled 2021-06-24: qty 10

## 2021-06-24 MED ORDER — PHENYLEPHRINE HCL (PRESSORS) 10 MG/ML IV SOLN
INTRAVENOUS | Status: DC | PRN
  Administered 2021-06-24 (×2): 80 ug via INTRAVENOUS

## 2021-06-24 MED ORDER — ONDANSETRON HCL 4 MG/2ML IJ SOLN
4.0000 mg | Freq: Once | INTRAMUSCULAR | Status: AC | PRN
  Administered 2021-06-24: 4 mg via INTRAVENOUS

## 2021-06-24 MED ORDER — AMISULPRIDE (ANTIEMETIC) 5 MG/2ML IV SOLN
10.0000 mg | Freq: Once | INTRAVENOUS | Status: AC | PRN
  Administered 2021-06-24: 10 mg via INTRAVENOUS

## 2021-06-24 MED ORDER — DEXAMETHASONE SODIUM PHOSPHATE 10 MG/ML IJ SOLN
INTRAMUSCULAR | Status: DC | PRN
  Administered 2021-06-24: 5 mg via INTRAVENOUS

## 2021-06-24 MED ORDER — OXYCODONE HCL 5 MG/5ML PO SOLN
5.0000 mg | Freq: Once | ORAL | Status: DC | PRN
Start: 2021-06-24 — End: 2021-06-24

## 2021-06-24 MED ORDER — PHENYLEPHRINE 40 MCG/ML (10ML) SYRINGE FOR IV PUSH (FOR BLOOD PRESSURE SUPPORT)
PREFILLED_SYRINGE | INTRAVENOUS | Status: AC
  Filled 2021-06-24: qty 10

## 2021-06-24 MED ORDER — FENTANYL CITRATE (PF) 100 MCG/2ML IJ SOLN
INTRAMUSCULAR | Status: AC
  Filled 2021-06-24: qty 2

## 2021-06-24 MED ORDER — OXYCODONE HCL 5 MG PO TABS: 5.0000 mg | ORAL_TABLET | Freq: Four times a day (QID) | ORAL | 0 refills | Status: DC | PRN

## 2021-06-24 SURGICAL SUPPLY — 31 items
BAG COUNTER SPONGE SURGICOUNT (BAG) IMPLANT
BENZOIN TINCTURE PRP APPL 2/3 (GAUZE/BANDAGES/DRESSINGS) ×2 IMPLANT
BLADE CLIPPER SURG (BLADE) ×2 IMPLANT
CHLORAPREP W/TINT 26 (MISCELLANEOUS) ×2 IMPLANT
COVER SURGICAL LIGHT HANDLE (MISCELLANEOUS) ×2 IMPLANT
DERMABOND ADVANCED (GAUZE/BANDAGES/DRESSINGS) ×1
DERMABOND ADVANCED .7 DNX12 (GAUZE/BANDAGES/DRESSINGS) ×1 IMPLANT
DRAPE LAPAROTOMY T 98X78 PEDS (DRAPES) ×2 IMPLANT
DRAPE UTILITY XL STRL (DRAPES) ×2 IMPLANT
DRSG TEGADERM 4X4.75 (GAUZE/BANDAGES/DRESSINGS) IMPLANT
ELECT COATED BLADE 2.86 ST (ELECTRODE) ×2 IMPLANT
ELECT REM PT RETURN 15FT ADLT (MISCELLANEOUS) ×2 IMPLANT
GAUZE SPONGE 4X4 12PLY STRL (GAUZE/BANDAGES/DRESSINGS) IMPLANT
GLOVE SURG POLYISO LF SZ7 (GLOVE) ×2 IMPLANT
GLOVE SURG UNDER POLY LF SZ7.5 (GLOVE) ×2 IMPLANT
GOWN STRL REUS W/TWL LRG LVL3 (GOWN DISPOSABLE) ×4 IMPLANT
GOWN STRL REUS W/TWL XL LVL3 (GOWN DISPOSABLE) ×4 IMPLANT
KIT BASIN OR (CUSTOM PROCEDURE TRAY) ×2 IMPLANT
KIT TURNOVER KIT A (KITS) ×2 IMPLANT
NEEDLE HYPO 25X1 1.5 SAFETY (NEEDLE) ×2 IMPLANT
PACK BASIC VI WITH GOWN DISP (CUSTOM PROCEDURE TRAY) ×2 IMPLANT
PENCIL SMOKE EVACUATOR (MISCELLANEOUS) ×2 IMPLANT
SPONGE T-LAP 18X18 ~~LOC~~+RFID (SPONGE) ×2 IMPLANT
STRIP CLOSURE SKIN 1/2X4 (GAUZE/BANDAGES/DRESSINGS) IMPLANT
SUT MNCRL AB 4-0 PS2 18 (SUTURE) ×2 IMPLANT
SUT VIC AB 2-0 SH 27 (SUTURE) ×2
SUT VIC AB 2-0 SH 27X BRD (SUTURE) ×1 IMPLANT
SUT VIC AB 3-0 SH 27 (SUTURE) ×2
SUT VIC AB 3-0 SH 27XBRD (SUTURE) ×1 IMPLANT
SYR CONTROL 10ML LL (SYRINGE) ×2 IMPLANT
TOWEL OR 17X26 10 PK STRL BLUE (TOWEL DISPOSABLE) ×2 IMPLANT

## 2021-06-24 NOTE — Op Note (Signed)
Preoperative diagnosis: lipoma  Postoperative diagnosis: same   Procedure: excision of 2 cm lipoma of posterior neck  Surgeon: Gurney Maxin, M.D.  Asst: Genella Mech (resident)  Anesthesia: general  Indications for procedure: Janice [name] is a 70 y.o. year old female with symptoms of worsening pain over her right posterior neck and bulge on exam. She presents for excision.  Description of procedure: The patient was brought into the operative suite. Anesthesia was administered with General endotracheal anesthesia. WHO checklist was applied. The patient was then placed in prone position. The area was prepped and draped in the usual sterile fashion.  Next, marcaine was infused over the area. A transverse incision was made through the skin. Cautery was used to dissect through the subcutaneous tissue. Blunt dissection and cautery was used to dissect the mass free of surrounding attachments. The mass was yellow and fatty. The mass was removed in its entirety. The wound was irrigated. The incision was closed with 3-0 vicryl in interrupted fashion and the skin was closed with 4-0 monocryl in running subcuticular stitch. Dermabond was put in place for dressing. The patient awoke from anesthesia and brought to pacu in stable condition. All counts were correct. The patient tolerated the procedure well.  Findings: 2 cm fatty tumor  Specimen: right posterior neck mass  Implant: none   Blood loss: 5 ml  Local anesthesia:  10 ml marcaine   Complications: none  Gurney Maxin, M.D. General, Bariatric, & Minimally Invasive Surgery [facility]

## 2021-06-24 NOTE — Anesthesia Postprocedure Evaluation (Signed)
Anesthesia Post Note  Patient: Janice [name]  Procedure(s) Performed: EXCISION RIGHT POSTERIOR NECK MASS (Right: Neck)     Patient location during evaluation: PACU Anesthesia Type: General Level of consciousness: awake and alert, oriented and patient cooperative Pain management: pain level controlled Vital Signs Assessment: post-procedure vital signs reviewed and stable Respiratory status: spontaneous breathing, nonlabored ventilation and respiratory function stable Cardiovascular status: blood pressure returned to baseline and stable Postop Assessment: no apparent nausea or vomiting Anesthetic complications: no   No notable events documented.  Last Vitals:  Vitals:   06/24/21 1030 06/24/21 1045  BP: 120/72 (!) 122/55  Pulse: 61 60  Resp: 18 15  Temp:    SpO2: 99% 92%    Last Pain:  Vitals:   06/24/21 1030  TempSrc:   PainSc: 0-No pain                 Pervis Hocking

## 2021-06-24 NOTE — Anesthesia Procedure Notes (Signed)
Procedure Name: Intubation Date/Time: 06/24/2021 9:04 AM Performed by: Lollie Sails, CRNA Pre-anesthesia Checklist: Patient identified, Emergency Drugs available, Suction available, Patient being monitored and Timeout performed Patient Re-evaluated:Patient Re-evaluated prior to induction Oxygen Delivery Method: Circle system utilized Preoxygenation: Pre-oxygenation with 100% oxygen Induction Type: IV induction Ventilation: Mask ventilation without difficulty Laryngoscope Size: Miller and 3 Grade View: Grade II Tube type: Oral Tube size: 7.0 mm Number of attempts: 1 Airway Equipment and Method: Stylet Placement Confirmation: ETT inserted through vocal cords under direct vision, positive ETCO2 and breath sounds checked- equal and bilateral Secured at: 23 cm Tube secured with: Tape Dental Injury: Teeth and Oropharynx as per pre-operative assessment

## 2021-06-24 NOTE — Transfer of Care (Signed)
Immediate Anesthesia Transfer of Care Note  Patient: Janice [name]  Procedure(s) Performed: EXCISION RIGHT POSTERIOR NECK MASS (Right: Neck)  Patient Location: PACU  Anesthesia Type:General  Level of Consciousness: awake, alert  and responds to stimulation  Airway & Oxygen Therapy: Patient Spontanous Breathing and Patient connected to face mask oxygen  Post-op Assessment: Report given to RN and Post -op Vital signs reviewed and stable  Post vital signs: Reviewed and stable  Last Vitals:  Vitals Value Taken Time  BP 137/66 06/24/21 0950  Temp    Pulse 70 06/24/21 0954  Resp 19 06/24/21 0954  SpO2 100 % 06/24/21 0954  Vitals shown include unvalidated device data.  Last Pain:  Vitals:   06/24/21 0811  TempSrc: Oral  PainSc: 0-No pain         Complications: No notable events documented.

## 2021-06-24 NOTE — H&P (Signed)
Chief Complaint: lump in neck   History of Present Illness: Janice [name] is a 70 y.o. female who is seen today as an office consultation at the request of Dr. Dagmar Hait for evaluation of lump in neck .   She has noticed the pain for the last 3 months. It is uncomfortable when she wears her CPAP. She denies drainage. She has a history of lymphoma that began the work up for this area. She underwent US showing a lipoma in the area  Review of Systems: A complete review of systems was obtained from the patient. I have reviewed this information and discussed as appropriate with the patient. See HPI as well for other ROS.  Review of Systems  Constitutional: Negative.  HENT: Negative.  Eyes: Negative.  Respiratory: Negative.  Cardiovascular: Negative.  Gastrointestinal: Negative.  Genitourinary: Negative.  Musculoskeletal: Negative.  Skin: Negative.  Neurological: Negative.  Endo/Heme/Allergies: Bruises/bleeds easily.  Psychiatric/Behavioral: Negative.    Medical History: Past Medical History:  Diagnosis Date   Arthritis   Diabetes mellitus without complication (CMS-HCC)   GERD (gastroesophageal reflux disease)   History of cancer   Hypertension   Liver disease   Sleep apnea   There is no problem list on file for this patient.  Past Surgical History:  Procedure Laterality Date   left knee replacement Left   rotator cuff surgery N/A    Allergies  Allergen Reactions   Benzonatate Anaphylaxis and Hives   Fluticasone Furoate-Vilanterol Anaphylaxis and Hives   Iodine Hives, Other (See Comments) and Swelling  And Betadine/caused hives and swelling   Latex Hives, Itching, Other (See Comments) and Rash  Burning   Simvastatin Other (See Comments)  Caused neuropathy in arms Neuropathy in arms   Bupropion Other (See Comments)  Negative thoughts. Crazy thoughts   Levofloxacin Other (See Comments)  Joint aches//leg, shoulder pain Joint pain   Penicillins Hives and Rash   Blisters Did it involve swelling of the face/tongue/throat, SOB, or low BP? Yes Did it involve sudden or severe rash/hives, skin peeling, or any reaction on the inside of your mouth or nose? Yes Did you need to seek medical attention at a hospital or doctor's office?Unknown When did it last happen? 2019      If all above answers are "NO", may proceed with cephalosporin use.   Povidone-Iodine Hives   Promethazine Nausea and Other (See Comments)  Doesn't work   Chlorthalidone Other (See Comments)  Ears ringing   Fluconazole Hives and Other (See Comments)  blister   Doxycycline Diarrhea and Itching   Current Outpatient Medications on File Prior to Visit  Medication Sig Dispense Refill   amLODIPine (NORVASC) 10 MG tablet Take 1 tablet by mouth once daily   aspirin 81 MG EC tablet Take by mouth   azelastine (ASTELIN) 137 mcg nasal spray Place into one nostril   flu vacc qs 2022-23, PF,, 65 yrs+, (FLUZONE HIGHDOSE QUAD 22-23 PF) 240 mcg/0.7 mL IM Syringe   naproxen (NAPROSYN) 500 MG tablet Take 500 mg by mouth   rosuvastatin (CRESTOR) 10 MG tablet Take 1 tablet by mouth once daily   spironolactone (ALDACTONE) 50 MG tablet Take 50 mg by mouth once daily   albuterol 90 mcg/actuation inhaler Inhale into the lungs   ascorbic acid, vitamin C, (VITAMIN C) 500 MG tablet Take by mouth   azithromycin (ZITHROMAX) 250 MG tablet TAKE 2 TABLETS BY MOUTH ON DAY 1, AND THEN TAKE 1 TABLET BY MOUTH ONCE A DAY ON DAY 2 THROUGH  DAY 5   cefdinir (OMNICEF) 300 mg capsule Take 300 mg by mouth 2 (two) times daily   chlorpheniramine (CHLOR-TRIMETON) 4 mg tablet Take by mouth   desoximetasone (TOPICORT) 0.25 % cream Apply topically   fluticasone propionate (FLONASE) 50 mcg/actuation nasal spray USE 2 SPRAY(S) IN EACH NOSTRIL IN THE MORNING   gabapentin (NEURONTIN) 300 MG capsule Take by mouth   insulin NPH (HUMULIN N) injection (concentration 100 units/mL) Inject subcutaneously   insulin REGULAR (HUMULIN R)  injection (concentration 100 units/mL) Inject subcutaneously   irbesartan (AVAPRO) 300 MG tablet Take 1 tablet by mouth once daily   metFORMIN (GLUCOPHAGE-XR) 500 MG XR tablet Take 1,000 mg by mouth 2 (two) times daily   ONETOUCH VERIO FLEX METER Misc   ONETOUCH VERIO TEST STRIPS test strip   No current facility-administered medications on file prior to visit.   Family History  Problem Relation Age of Onset   Breast cancer Mother   Stroke Father   Obesity Father   High blood pressure (Hypertension) Father   Hyperlipidemia (Elevated cholesterol) Father   Diabetes Father   Obesity Sister   Diabetes Sister   Obesity Brother   High blood pressure (Hypertension) Brother   Diabetes Brother    Social History   Tobacco Use  Smoking Status Never Smoker  Smokeless Tobacco Never Used    Social History   Socioeconomic History   Marital status: Widowed  Tobacco Use   Smoking status: Never Smoker   Smokeless tobacco: Never Used  Vaping Use   Vaping Use: Never used  Substance and Sexual Activity   Alcohol use: Never   Drug use: Never   Objective:   Vitals:  05/19/21 1031  BP: (!) 164/88  Pulse: 74  Temp: (!) 35.8 C (96.4 F)  SpO2: 94%  Weight: (!) 102.1 kg (225 lb)  Height: 163.8 cm (5' 4.5")   Body mass index is 38.02 kg/m.  Physical Exam Constitutional:  Appearance: Normal appearance.  HENT:  Head: Normocephalic and atraumatic.  Pulmonary:  Effort: Pulmonary effort is normal.  Musculoskeletal:  General: Normal range of motion.  Cervical back: Normal range of motion.  Skin: Comments: 3 cm mass over right mastoid process, mobile to underlying bone  Neurological:  General: No focal deficit present.  Mental Status: She is alert and oriented to person, place, and time. Mental status is at baseline.  Psychiatric:  Mood and Affect: Mood normal.  Behavior: Behavior normal.  Thought Content: Thought content normal.   Labs, Imaging and Diagnostic  Testing: Reviewed Manish Patwardhan's notes  Assessment and Plan:  Diagnoses and all orders for this visit:  Neck mass  NASH (nonalcoholic steatohepatitis)  Umbilical hernia without obstruction or gangrene  Obstructive sleep apnea syndrome  We discussed the mass etiology and potential options for treatment. We discussed details of surgery with risks of bleeding, wound infection, wound break down and recurrence. Due to her diabetes and liver disease she has a higher risk of issues and needs to be down in a operating room. She showed understanding and wanted to move forward with surgery.  We also discussed her small umbilical hernia. It is not causing pain at this time. She is undergoing further liver work up with GI. I recommended observation of the hernia unless it starts causing pain or other symptoms.

## 2021-06-25 ENCOUNTER — Encounter (HOSPITAL_COMMUNITY): Payer: Self-pay | Admitting: General Surgery

## 2021-06-25 ENCOUNTER — Telehealth: Payer: Self-pay | Admitting: Internal Medicine

## 2021-06-25 LAB — SURGICAL PATHOLOGY

## 2021-06-25 NOTE — Telephone Encounter (Signed)
Inbound call from patient. States she have her MRI scheduled for 10/27. Is asking if there is anything to help with her claustrophobia.

## 2021-06-25 NOTE — Telephone Encounter (Signed)
Pt states that she has an MRI scheduled for 07/08/21. Pt states that she is very claustrophobic and was wondering if something can be prescribed for her to take prior to the exam. Please Advise.

## 2021-06-28 ENCOUNTER — Telehealth: Payer: Self-pay | Admitting: Internal Medicine

## 2021-06-28 MED ORDER — CLONAZEPAM 0.5 MG PO TBDP: ORAL_TABLET | ORAL | 0 refills | Status: DC

## 2021-06-28 NOTE — Telephone Encounter (Signed)
I prescribed clonazepam just 2 of them she can take 1 or 2.  Please confirm that she is in an open MRI.  She should take these probably 30 minutes to an hour before the test

## 2021-06-28 NOTE — Telephone Encounter (Signed)
Pt made aware  of the prescriptions that was sent to pharmacy and Dr. Carlean Purl recommendations. Pt verbalized understanding with all questions answered.

## 2021-07-06 ENCOUNTER — Ambulatory Visit: Payer: Self-pay | Admitting: Student

## 2021-07-06 ENCOUNTER — Other Ambulatory Visit: Payer: Self-pay

## 2021-07-06 ENCOUNTER — Encounter: Payer: Self-pay | Admitting: Student

## 2021-07-06 VITALS — BP 143/56 | HR 71 | Temp 98.3°F | Ht 64.5 in | Wt 225.0 lb

## 2021-07-06 DIAGNOSIS — I6522 Occlusion and stenosis of left carotid artery: Secondary | ICD-10-CM

## 2021-07-06 DIAGNOSIS — R0609 Other forms of dyspnea: Secondary | ICD-10-CM

## 2021-07-06 DIAGNOSIS — I10 Essential (primary) hypertension: Secondary | ICD-10-CM

## 2021-07-06 MED ORDER — HYDRALAZINE HCL 25 MG PO TABS
25.0000 mg | ORAL_TABLET | Freq: Two times a day (BID) | ORAL | 3 refills | Status: DC
Start: 2021-07-06 — End: 2021-11-29

## 2021-07-06 NOTE — Progress Notes (Signed)
Primary Physician/Referring:  Prince Solian, MD  Patient ID: Janice [name], female    DOB: April 30, 1951, 70 y.o.   MRN: [id number]  Chief Complaint  Patient presents with   Moderate aortic regurgitation   Hypertension   medication management    HPI:    Trina Asch  is a 70 y.o. female with hypertension, type 2 DM, moderate AI, carotid artery stenosis, liver cirrhosis, chronic lymphocytic lymphoma. History of significant anemia, which lead to discontinuation of aspirin. Patient reported intolerance to high dose Crestor in the past.   Patient presents for urgent visit with concerns of decreased exercise tolerance over the last 2 months.  She states that in 04/2021 she was diagnosed with a respiratory infection that lasted for approximately 1 month.  This limited her ability to do physical activity.  She has since followed up with pulmonology who does not feel continued decreased exercise tolerance is related to underlying lung pathology.  Patient also reports she has recently become motivated to be more aggressive with management of cardiovascular risk factors.  She is working on [employer] and lifestyle modifications, however she does continue to struggle with weight loss.  Denies chest pain, palpitations, syncope, near syncope.  However she does have dyspnea on exertion which is worse over the last 2 months as well.  Past Medical History:  Diagnosis Date   Allergy    Asthma    seasonal   Cellulitis 2013   Left toe   Cirrhosis ([place])    Common migraine    History of   Complication of anesthesia    Per pt/had breathing problems with "block" during rotator cuff surgery. 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[place])    Past Surgical History:  Procedure Laterality Date   ANKLE SURGERY     Left    COLONOSCOPY  05/2018   LEEP N/A 09/14/2018   Procedure: LOOP ELECTROSURGICAL EXCISION PROCEDURE (LEEP);  Surgeon: Arvella Nigh, MD;  Location: [facility];  Service: Gynecology;  Laterality: N/A;   MASS EXCISION Right 06/24/2021   Procedure: EXCISION RIGHT POSTERIOR NECK MASS;  Surgeon: Kinsinger, [name], MD;  Location: [facility];  Service: General;  Laterality: Right;   ROTATOR CUFF REPAIR Bilateral 2012, 2015   TONSILLECTOMY     TOTAL KNEE ARTHROPLASTY Left 04/08/2019   Procedure: TOTAL KNEE ARTHROPLASTY;  Surgeon: Elsie Saas, MD;  Location: [facility];  Service: Orthopedics;  Laterality: Left;   UPPER GI ENDOSCOPY  05/2018   Family History  Problem Relation Age of Onset   Breast cancer Mother    Bone cancer Mother    Heart failure Father    Diabetes Mellitus II Father    Hypertension Father    Diabetes Mellitus  II Sister    Obesity Sister    Other Sister        retina problem   Hypertension Brother    Diabetes Mellitus II Brother    Colon cancer Neg Hx    Rectal cancer Neg Hx    Stomach cancer Neg Hx    Esophageal cancer Neg Hx    Pancreatic cancer Neg Hx    Liver disease Neg Hx     Social History   Tobacco Use   Smoking status: Never   Smokeless tobacco: Never  Substance Use Topics   Alcohol use: Not Currently    Comment: very rare   Marital Status: Widowed   ROS  Review of Systems  Constitutional: Positive for malaise/fatigue and weight gain.  Cardiovascular:  Positive for dyspnea on exertion. Negative for chest pain, claudication,  leg swelling, near-syncope, orthopnea, palpitations, paroxysmal nocturnal dyspnea and syncope.       Decreased exercise tolerance   Musculoskeletal:  Positive for joint pain (left knee).  Neurological:  Negative for dizziness.   Objective  Blood pressure (!) 143/56, pulse 71, temperature 98.3 F (36.8 C), temperature source Temporal, height 5' 4.5" (1.638 m), weight 225 lb (102.1 kg), SpO2 98 %.  Vitals with BMI 07/06/2021 06/24/2021 06/24/2021  Height 5' 4.5" - -  Weight 225 lbs - -  BMI 75.64 - -  Systolic 332 951 884  Diastolic 56 46 55  Pulse 71 65 60      Physical Exam Vitals reviewed.  Constitutional:      Appearance: She is obese.  HENT:     Head: Normocephalic and atraumatic.  Cardiovascular:     Rate and Rhythm: Normal rate and regular rhythm.     Pulses: Intact distal pulses.          Carotid pulses are  on the left side with bruit.    Heart sounds: S1 normal and S2 normal. No murmur heard.   No gallop.  Pulmonary:     Effort: Pulmonary effort is normal. No respiratory distress.     Breath sounds: No wheezing, rhonchi or rales.  Musculoskeletal:     Right lower leg: No edema.     Left lower leg: No edema.  Skin:    General: Skin is warm and dry.  Neurological:     Mental Status: She is alert.    Laboratory examination:   Recent Labs    01/15/21 0901 05/18/21 1431 06/07/21 1149  NA 138 139 138  K 4.4 4.5 4.9  CL 102 106 105  CO2 _0 GLUCOSE 316* 324* 141*  BUN _1 CREATININE 0.82 1.01* 0.67  CALCIUM 8.9 8.9 9.1  GFRNONAA >60 60* >60   CrCl cannot be calculated (Patient's most recent lab result is older than the maximum 21 days allowed.).  CMP Latest Ref Rng & Units 06/07/2021 05/18/2021 01/15/2021  Glucose 70 - 99 mg/dL 141(H) 324(H) 316(H)  BUN 8 - 23 mg/dL _2 Creatinine 0.44 - 1.00 mg/dL 0.67 1.01(H) 0.82  Sodium 135 - 145 mmol/L 138 139 138  Potassium 3.5 - 5.1 mmol/L 4.9 4.5 4.4  Chloride 98 - 111 mmol/L 105 106 102  CO2 22  - 32 mmol/L _3 Calcium 8.9 - 10.3 mg/dL 9.1 8.9 8.9  Total Protein 6.5 - 8.1 g/dL 6.7 6.1(L) 6.4(L)  Total Bilirubin 0.3 - 1.2 mg/dL 0.9 0.5 0.7  Alkaline Phos 38 - 126 U/L 67 76 75  AST 15 - 41 U/L 22 14(L) 17  ALT 0 - 44 U/L _0 CBC Latest Ref Rng & Units 06/07/2021 05/18/2021 04/29/2021  WBC 4.0 - 10.5 K/uL 17.5(H) 12.5(H) 15.1(H)  Hemoglobin 12.0 - 15.0 g/dL 11.9(L) 10.6(L) 11.9(L)  Hematocrit 36.0 - 46.0 % 38.7 33.0(L) 36.5  Platelets 150 - 400 K/uL 105(L) 87(L) 93.0(L)    Lipid Panel No results for input(s): CHOL, TRIG, LDLCALC, VLDL, HDL, CHOLHDL, LDLDIRECT in the last 8760 hours.  HEMOGLOBIN A1C Lab Results  Component Value Date   HGBA1C 7.5 (H) 06/07/2021   MPG 168.55 06/07/2021   TSH No results for input(s): TSH in the last 8760 hours.  External labs:  05/28/2021: Glucose 141, creatinine 0.8, GFR >60, sodium 135, potassium 5.1, BUN 12 Hgb 11.8, HCT 36.8 Total cholesterol 113, triglycerides 70, HDL 35, LDL 64 Lipoprotein B 60 (normal) TSH 1.52 A1c 7.6%  05/13/2020: Glucose 71, BUN 11, creatinine 0.7, GFR >60, sodium 142, potassium 4.6, AST 22, ALT 34, alk phos 90 Hemoglobin 12.4, hematocrit 38.4, MCV 84.1, platelet 83 Total cholesterol 120, triglycerides 86, HDL 36, LDL 67, lipoprotein B 59 TSH 2.03 A1c 6.2%  12/31/2019: Glucose BUN/Cr 10/0.8. eGFR 71 HbA1C 6.7%   09/2018: Chol 151, TG 164, HDL 33, LDL 85 TSH 1.9 normal  Allergies   Allergies  Allergen Reactions   Benzonatate Anaphylaxis and Hives   Fluticasone Furoate-Vilanterol Anaphylaxis and Hives    Other reaction(s): hives  breo ellipta   Iodine Hives, Swelling and Other (See Comments)    And Betadine/caused hives and swelling   Latex Hives, Itching, Rash and Other (See Comments)    Burning   Simvastatin Other (See Comments)    Caused neuropathy in arms Neuropathy in arms   Bupropion Other (See Comments)    Negative thoughts. Crazy thoughts   Levofloxacin Other (See Comments)     Joint aches//leg, shoulder pain Joint pain   Penicillins Hives   Povidone-Iodine Hives   Amoxicillin Hives    Blisters Did it involve swelling of the face/tongue/throat, SOB, or low BP? Yes Did it involve sudden or severe rash/hives, skin peeling, or any reaction on the inside of your mouth or nose? Yes Did you need to seek medical attention at a hospital or doctor's office?Unknown When did it last happen? 2019      If all above answers are "NO", may proceed with cephalosporin use.    Chlorthalidone Other (See Comments)    Ears ringing    Diflucan [Fluconazole] Hives and Other (See Comments)    blister   Amoxicillin-Pot Clavulanate Rash   Doxycycline Itching and Diarrhea      Medications Prior to Visit:   Outpatient Medications Prior to Visit  Medication Sig Dispense Refill   albuterol (PROVENTIL HFA;VENTOLIN HFA) 108 (90 Base) MCG/ACT inhaler Inhale 1-2 puffs into the lungs every 6 (six) hours as needed (Cough).     amLODipine (NORVASC) 10 MG tablet Take 10 mg by mouth daily.     Apoaequorin (PREVAGEN) 10 MG CAPS Take by mouth.     aspirin EC 81 MG tablet Take 1 tablet (81 mg total) by mouth daily. Swallow whole. 90 tablet 3   azelastine (ASTELIN) 0.1 % nasal spray Place 1 spray into both nostrils 2 (two) times daily. Use in each nostril as directed 90 mL 3   Calcium-Magnesium-Zinc (CAL-MAG-ZINC PO) Take 2 tablets by mouth daily.     Carboxymethylcellul-Glycerin (REFRESH OPTIVE PF OP) Place 1 drop into both eyes daily  as needed (dry eyes). Non-pres     Coenzyme Q10 (CO Q-10) 100 MG CAPS Take 100 mg by mouth daily.     desoximetasone (TOPICORT) 0.25 % cream Apply 1 application topically 4 (four) times daily as needed (rash).     diclofenac sodium (VOLTAREN) 1 % GEL Apply 2 g topically 2 (two) times daily as needed (knee pain/ foot /Triger finger).  2   fluticasone (FLONASE) 50 MCG/ACT nasal spray Place 1 spray into both nostrils daily.     gabapentin (NEURONTIN) 100 MG capsule  Take 1 capsule (100 mg total) by mouth daily. One three times daily (Patient taking differently: Take 600 mg by mouth daily with supper.) 90 capsule 2   insulin NPH Human (HUMULIN N,NOVOLIN N) 100 UNIT/ML injection Inject 40-75 Units into the skin See admin instructions. Inject 45 units subcutaneously at breakfast, 30 units at lunch & 45 units at supper     insulin regular (NOVOLIN R,HUMULIN R) 100 units/mL injection Inject 30 Units into the skin 3 (three) times daily before meals.     irbesartan (AVAPRO) 300 MG tablet Take 300 mg by mouth daily.     metFORMIN (GLUCOPHAGE-XR) 500 MG 24 hr tablet Take 1,000 mg by mouth 2 (two) times daily.     omeprazole (PRILOSEC) 40 MG capsule Take 40 mg by mouth 2 (two) times daily.     PARoxetine (PAXIL) 10 MG tablet Take 10 mg by mouth daily.     Probiotic Product (PROBIOTIC DAILY PO) Take 1 tablet by mouth daily.     rosuvastatin (CRESTOR) 10 MG tablet Take 1 tablet (10 mg total) by mouth daily. (Patient taking differently: Take 10 mg by mouth daily with supper.) 90 tablet 3   spironolactone (ALDACTONE) 50 MG tablet Take 1 tablet (50 mg total) by mouth daily. 90 tablet 0   terconazole (TERAZOL 3) 0.8 % vaginal cream Place 1 applicator vaginally as needed (yeast infections).      vitamin C (ASCORBIC ACID) 500 MG tablet Take 500 mg by mouth 2 (two) times a day.      oxyCODONE (OXY IR/ROXICODONE) 5 MG immediate release tablet Take 1 tablet (5 mg total) by mouth every 6 (six) hours as needed for severe pain. 8 tablet 0   clonazePAM (KLONOPIN) 0.5 MG disintegrating tablet 1 or 2 prior to MRI 2 tablet 0   chlorpheniramine (CHLOR-TRIMETON) 4 MG tablet Take 4 mg by mouth 2 (two) times daily as needed for allergies. (Patient not taking: No sig reported)     No facility-administered medications prior to visit.     Final Medications at End of Visit    Current Meds  Medication Sig   albuterol (PROVENTIL HFA;VENTOLIN HFA) 108 (90 Base) MCG/ACT inhaler Inhale 1-2  puffs into the lungs every 6 (six) hours as needed (Cough).   amLODipine (NORVASC) 10 MG tablet Take 10 mg by mouth daily.   Apoaequorin (PREVAGEN) 10 MG CAPS Take by mouth.   aspirin EC 81 MG tablet Take 1 tablet (81 mg total) by mouth daily. Swallow whole.   azelastine (ASTELIN) 0.1 % nasal spray Place 1 spray into both nostrils 2 (two) times daily. Use in each nostril as directed   Calcium-Magnesium-Zinc (CAL-MAG-ZINC PO) Take 2 tablets by mouth daily.   Carboxymethylcellul-Glycerin (REFRESH OPTIVE PF OP) Place 1 drop into both eyes daily as needed (dry eyes). Non-pres   Coenzyme Q10 (CO Q-10) 100 MG CAPS Take 100 mg by mouth daily.   desoximetasone (TOPICORT) 0.25 % cream Apply  1 application topically 4 (four) times daily as needed (rash).   diclofenac sodium (VOLTAREN) 1 % GEL Apply 2 g topically 2 (two) times daily as needed (knee pain/ foot /Triger finger).   fluticasone (FLONASE) 50 MCG/ACT nasal spray Place 1 spray into both nostrils daily.   gabapentin (NEURONTIN) 100 MG capsule Take 1 capsule (100 mg total) by mouth daily. One three times daily (Patient taking differently: Take 600 mg by mouth daily with supper.)   hydrALAZINE (APRESOLINE) 25 MG tablet Take 1 tablet (25 mg total) by mouth 2 (two) times daily.   insulin NPH Human (HUMULIN N,NOVOLIN N) 100 UNIT/ML injection Inject 40-75 Units into the skin See admin instructions. Inject 45 units subcutaneously at breakfast, 30 units at lunch & 45 units at supper   insulin regular (NOVOLIN R,HUMULIN R) 100 units/mL injection Inject 30 Units into the skin 3 (three) times daily before meals.   irbesartan (AVAPRO) 300 MG tablet Take 300 mg by mouth daily.   metFORMIN (GLUCOPHAGE-XR) 500 MG 24 hr tablet Take 1,000 mg by mouth 2 (two) times daily.   omeprazole (PRILOSEC) 40 MG capsule Take 40 mg by mouth 2 (two) times daily.   PARoxetine (PAXIL) 10 MG tablet Take 10 mg by mouth daily.   Probiotic Product (PROBIOTIC DAILY PO) Take 1 tablet by  mouth daily.   rosuvastatin (CRESTOR) 10 MG tablet Take 1 tablet (10 mg total) by mouth daily. (Patient taking differently: Take 10 mg by mouth daily with supper.)   spironolactone (ALDACTONE) 50 MG tablet Take 1 tablet (50 mg total) by mouth daily.   terconazole (TERAZOL 3) 0.8 % vaginal cream Place 1 applicator vaginally as needed (yeast infections).    vitamin C (ASCORBIC ACID) 500 MG tablet Take 500 mg by mouth 2 (two) times a day.    [DISCONTINUED] oxyCODONE (OXY IR/ROXICODONE) 5 MG immediate release tablet Take 1 tablet (5 mg total) by mouth every 6 (six) hours as needed for severe pain.     Radiology:   No results found.  Cardiac Studies:   Carotid artery duplex 61/44/3154: Peak systolic velocities in the right bifurcation, internal, external andcommon carotid arteries are within normal limits. 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 Stenosis in the leftexternal carotid artery (<50%). Antegrade right vertebral artery flow. Antegrade left vertebral artery flow. Compared to 01/29/2020, left ICA stenosis was in the 50-69% range. Follow up in one year is appropriate if clinically indicated.   Echocardiogram 02/03/2021:  Normal LV systolic function with visual EF 60-65%. Left ventricle cavity  is normal in size. Mild to moderate left ventricular hypertrophy. Normal  global wall motion. Doppler evidence of grade II (pseudonormal) diastolic dysfunction, elevated LAP.  Left atrial cavity is mildly dilated.  Mild (Grade I) aortic regurgitation.  Mild (Grade I) mitral regurgitation.  Mild tricuspid regurgitation. No evidence of pulmonary hypertension.  Compared to study dated 02/27/2019: G1DD is now G2DD, elevated LAP is new, moderate AR is now mild, otherwise no significant change.   [place] Stress Test 02/18/2019: Stress EKG is non-diagnostic, as this is pharmacological stress test. Normal myocardial perfusion. Stress LV EF: 50%. Low risk stress test.  EKG:    03/08/2021: Sinus rhythm at a rate of 76 bpm.  Normal axis.  Poor R wave progression, cannot exclude anteroseptal infarct old.  No evidence of ischemia or underlying injury pattern.  EKG 03/09/2020: Sinus rhythm 76 bpm Normal EKG  Assessment     ICD-10-CM   1. Dyspnea on exertion  R06.09  PCV MYOCARDIAL PERFUSION WITH LEXISCAN    Brain natriuretic peptide    2. Asymptomatic stenosis of left carotid artery  I65.22     3. Essential hypertension  D80 Basic metabolic panel       Medications Discontinued During This Encounter  Medication Reason   chlorpheniramine (CHLOR-TRIMETON) 4 MG tablet Error   oxyCODONE (OXY IR/ROXICODONE) 5 MG immediate release tablet Completed Course    Meds ordered this encounter  Medications   hydrALAZINE (APRESOLINE) 25 MG tablet    Sig: Take 1 tablet (25 mg total) by mouth 2 (two) times daily.    Dispense:  60 tablet    Refill:  3    Recommendations:   Laurajean Hosek is a 70 y.o. female with hypertension, type 2 DM, moderate AI, carotid artery stenosis. History of significant anemia, which lead to discontinuation of aspirin. Patient reported intolerance to high dose Crestor in the past.   Patient presents for urgent visit with complaints of decreased exercise tolerance and dyspnea on exertion worsening over the last 2 months.  Given multiple cardiovascular risk factors and patient's present symptoms shared decision was to proceed with repeat ischemic evaluation.  Given patient's dyspnea with even minimal exertion will obtain Lexiscan nuclear stress test as she is not a treadmill candidate.  We will also obtain BNP given dyspnea on exertion.   In regard to hypertension, patient has multiple medication intolerances, therefore shared decision was to proceed with hydralazine 25 mg twice daily as her blood pressure remains uncontrolled.  Urged patient to continue to focus on diet and lifestyle modifications including low-sodium intake and calorie deficit  as well as increased physical activity.  Suspect patient's dyspnea on exertion is likely multifactorial, including deconditioning and obesity.  Patient is presently scheduled for repeat carotid artery duplex in May 2023.  We will continue aspirin and statin therapy.  Follow-up in 6 weeks, sooner if needed, for results of cardiac testing and hypertension.   Alethia Berthold, PA-C 07/06/2021, 2:06 PM Office: [phone]

## 2021-07-07 ENCOUNTER — Encounter (INDEPENDENT_AMBULATORY_CARE_PROVIDER_SITE_OTHER): Payer: HMO | Admitting: Ophthalmology

## 2021-07-07 ENCOUNTER — Other Ambulatory Visit: Payer: Self-pay | Admitting: Physician Assistant

## 2021-07-07 DIAGNOSIS — H43813 Vitreous degeneration, bilateral: Secondary | ICD-10-CM | POA: Diagnosis not present

## 2021-07-07 DIAGNOSIS — H35033 Hypertensive retinopathy, bilateral: Secondary | ICD-10-CM

## 2021-07-07 DIAGNOSIS — E113293 Type 2 diabetes mellitus with mild nonproliferative diabetic retinopathy without macular edema, bilateral: Secondary | ICD-10-CM

## 2021-07-07 DIAGNOSIS — H2513 Age-related nuclear cataract, bilateral: Secondary | ICD-10-CM | POA: Diagnosis not present

## 2021-07-07 DIAGNOSIS — I10 Essential (primary) hypertension: Secondary | ICD-10-CM

## 2021-07-07 LAB — BASIC METABOLIC PANEL
BUN/Creatinine Ratio: 19 (ref 12–28)
BUN: 14 mg/dL (ref 8–27)
CO2: 23 mmol/L (ref 20–29)
Calcium: 9.4 mg/dL (ref 8.7–10.3)
Chloride: 101 mmol/L (ref 96–106)
Creatinine, Ser: 0.75 mg/dL (ref 0.57–1.00)
Glucose: 210 mg/dL — ABNORMAL HIGH (ref 70–99)
Potassium: 4.9 mmol/L (ref 3.5–5.2)
Sodium: 138 mmol/L (ref 134–144)
eGFR: 86 mL/min/{1.73_m2} (ref >59)

## 2021-07-07 LAB — BRAIN NATRIURETIC PEPTIDE: BNP: 9.5 pg/mL (ref 0.0–100.0)

## 2021-07-08 ENCOUNTER — Ambulatory Visit
Admission: RE | Admit: 2021-07-08 | Discharge: 2021-07-08 | Disposition: A | Discharge status: Home / Self Care | Payer: HMO | Source: Ambulatory Visit | Attending: Internal Medicine | Admitting: Internal Medicine

## 2021-07-08 ENCOUNTER — Other Ambulatory Visit: Payer: Self-pay

## 2021-07-08 DIAGNOSIS — Z8572 Personal history of non-Hodgkin lymphomas: Secondary | ICD-10-CM | POA: Diagnosis not present

## 2021-07-08 DIAGNOSIS — K7460 Unspecified cirrhosis of liver: Secondary | ICD-10-CM | POA: Diagnosis not present

## 2021-07-08 DIAGNOSIS — R935 Abnormal findings on diagnostic imaging of other abdominal regions, including retroperitoneum: Secondary | ICD-10-CM

## 2021-07-08 DIAGNOSIS — D7389 Other diseases of spleen: Secondary | ICD-10-CM | POA: Diagnosis not present

## 2021-07-08 DIAGNOSIS — K7689 Other specified diseases of liver: Secondary | ICD-10-CM | POA: Diagnosis not present

## 2021-07-08 MED ORDER — GADOBENATE DIMEGLUMINE 529 MG/ML IV SOLN
20.0000 mL | Freq: Once | INTRAVENOUS | Status: AC | PRN
  Administered 2021-07-08: 20 mL via INTRAVENOUS

## 2021-07-19 ENCOUNTER — Other Ambulatory Visit: Payer: Self-pay

## 2021-07-21 ENCOUNTER — Other Ambulatory Visit: Payer: HMO

## 2021-07-27 DIAGNOSIS — Z1231 Encounter for screening mammogram for malignant neoplasm of breast: Secondary | ICD-10-CM | POA: Diagnosis not present

## 2021-07-27 DIAGNOSIS — Z6838 Body mass index (BMI) 38.0-38.9, adult: Secondary | ICD-10-CM | POA: Diagnosis not present

## 2021-07-27 DIAGNOSIS — Z124 Encounter for screening for malignant neoplasm of cervix: Secondary | ICD-10-CM | POA: Diagnosis not present

## 2021-08-03 ENCOUNTER — Other Ambulatory Visit: Payer: HMO

## 2021-08-03 DIAGNOSIS — E119 Type 2 diabetes mellitus without complications: Secondary | ICD-10-CM | POA: Diagnosis not present

## 2021-08-03 DIAGNOSIS — R0781 Pleurodynia: Secondary | ICD-10-CM | POA: Diagnosis not present

## 2021-08-03 DIAGNOSIS — I10 Essential (primary) hypertension: Secondary | ICD-10-CM | POA: Diagnosis not present

## 2021-08-03 DIAGNOSIS — R071 Chest pain on breathing: Secondary | ICD-10-CM | POA: Diagnosis not present

## 2021-08-03 DIAGNOSIS — W108XXA Fall (on) (from) other stairs and steps, initial encounter: Secondary | ICD-10-CM | POA: Diagnosis not present

## 2021-08-09 ENCOUNTER — Telehealth: Payer: Self-pay | Admitting: Family Medicine

## 2021-08-09 DIAGNOSIS — L820 Inflamed seborrheic keratosis: Secondary | ICD-10-CM | POA: Diagnosis not present

## 2021-08-09 DIAGNOSIS — D692 Other nonthrombocytopenic purpura: Secondary | ICD-10-CM | POA: Diagnosis not present

## 2021-08-09 DIAGNOSIS — D1801 Hemangioma of skin and subcutaneous tissue: Secondary | ICD-10-CM | POA: Diagnosis not present

## 2021-08-09 DIAGNOSIS — D2261 Melanocytic nevi of right upper limb, including shoulder: Secondary | ICD-10-CM | POA: Diagnosis not present

## 2021-08-09 DIAGNOSIS — D2239 Melanocytic nevi of other parts of face: Secondary | ICD-10-CM | POA: Diagnosis not present

## 2021-08-09 DIAGNOSIS — L309 Dermatitis, unspecified: Secondary | ICD-10-CM | POA: Diagnosis not present

## 2021-08-09 DIAGNOSIS — L918 Other hypertrophic disorders of the skin: Secondary | ICD-10-CM | POA: Diagnosis not present

## 2021-08-09 DIAGNOSIS — D485 Neoplasm of uncertain behavior of skin: Secondary | ICD-10-CM | POA: Diagnosis not present

## 2021-08-09 DIAGNOSIS — L821 Other seborrheic keratosis: Secondary | ICD-10-CM | POA: Diagnosis not present

## 2021-08-09 NOTE — Telephone Encounter (Signed)
Pt called wanting to inform provider and have it documented that she had a fall on the 20th of Nov and hurt her ribs. Pt will not be able to use her cpap machine until healed. Pt states about 4-6 weeks.

## 2021-08-09 NOTE — Telephone Encounter (Signed)
Noted. So sorry to hear of this.

## 2021-08-17 ENCOUNTER — Ambulatory Visit: Payer: HMO | Admitting: Student

## 2021-08-23 ENCOUNTER — Other Ambulatory Visit: Payer: Self-pay

## 2021-08-23 DIAGNOSIS — C9110 Chronic lymphocytic leukemia of B-cell type not having achieved remission: Secondary | ICD-10-CM

## 2021-08-24 ENCOUNTER — Inpatient Hospital Stay (HOSPITAL_BASED_OUTPATIENT_CLINIC_OR_DEPARTMENT_OTHER): Payer: HMO | Admitting: Hematology

## 2021-08-24 ENCOUNTER — Inpatient Hospital Stay: Payer: HMO | Attending: Hematology

## 2021-08-24 ENCOUNTER — Other Ambulatory Visit: Payer: Self-pay

## 2021-08-24 VITALS — BP 140/51 | HR 75 | Temp 97.9°F | Resp 18 | Wt 225.4 lb

## 2021-08-24 DIAGNOSIS — D509 Iron deficiency anemia, unspecified: Secondary | ICD-10-CM | POA: Diagnosis not present

## 2021-08-24 DIAGNOSIS — E785 Hyperlipidemia, unspecified: Secondary | ICD-10-CM | POA: Diagnosis not present

## 2021-08-24 DIAGNOSIS — K7460 Unspecified cirrhosis of liver: Secondary | ICD-10-CM | POA: Insufficient documentation

## 2021-08-24 DIAGNOSIS — E118 Type 2 diabetes mellitus with unspecified complications: Secondary | ICD-10-CM | POA: Diagnosis not present

## 2021-08-24 DIAGNOSIS — M1990 Unspecified osteoarthritis, unspecified site: Secondary | ICD-10-CM | POA: Insufficient documentation

## 2021-08-24 DIAGNOSIS — C9110 Chronic lymphocytic leukemia of B-cell type not having achieved remission: Secondary | ICD-10-CM | POA: Diagnosis not present

## 2021-08-24 DIAGNOSIS — D696 Thrombocytopenia, unspecified: Secondary | ICD-10-CM | POA: Diagnosis not present

## 2021-08-24 DIAGNOSIS — Z96652 Presence of left artificial knee joint: Secondary | ICD-10-CM | POA: Diagnosis not present

## 2021-08-24 DIAGNOSIS — I10 Essential (primary) hypertension: Secondary | ICD-10-CM | POA: Insufficient documentation

## 2021-08-24 DIAGNOSIS — Z803 Family history of malignant neoplasm of breast: Secondary | ICD-10-CM | POA: Insufficient documentation

## 2021-08-24 LAB — CMP (CANCER CENTER ONLY)
ALT: 19 U/L (ref 0–44)
AST: 14 U/L — ABNORMAL LOW (ref 15–41)
Albumin: 4.1 g/dL (ref 3.5–5.0)
Alkaline Phosphatase: 82 U/L (ref 38–126)
Anion gap: 8 (ref 5–15)
BUN: 14 mg/dL (ref 8–23)
CO2: 24 mmol/L (ref 22–32)
Calcium: 8.7 mg/dL — ABNORMAL LOW (ref 8.9–10.3)
Chloride: 105 mmol/L (ref 98–111)
Creatinine: 0.82 mg/dL (ref 0.44–1.00)
GFR, Estimated: >60 mL/min (ref >60)
Glucose, Bld: 229 mg/dL — ABNORMAL HIGH (ref 70–99)
Potassium: 4.6 mmol/L (ref 3.5–5.1)
Sodium: 137 mmol/L (ref 135–145)
Total Bilirubin: 0.5 mg/dL (ref 0.3–1.2)
Total Protein: 6.2 g/dL — ABNORMAL LOW (ref 6.5–8.1)

## 2021-08-24 LAB — CBC WITH DIFFERENTIAL (CANCER CENTER ONLY)
Abs Immature Granulocytes: 0.11 10*3/uL — ABNORMAL HIGH (ref 0.00–0.07)
Basophils Absolute: 0.1 10*3/uL (ref 0.0–0.1)
Basophils Relative: 1 %
Eosinophils Absolute: 0.2 10*3/uL (ref 0.0–0.5)
Eosinophils Relative: 1 %
HCT: 33.2 % — ABNORMAL LOW (ref 36.0–46.0)
Hemoglobin: 10.4 g/dL — ABNORMAL LOW (ref 12.0–15.0)
Immature Granulocytes: 1 %
Lymphocytes Relative: 65 %
Lymphs Abs: 13 10*3/uL — ABNORMAL HIGH (ref 0.7–4.0)
MCH: 26.7 pg (ref 26.0–34.0)
MCHC: 31.3 g/dL (ref 30.0–36.0)
MCV: 85.1 fL (ref 80.0–100.0)
Monocytes Absolute: 0.9 10*3/uL (ref 0.1–1.0)
Monocytes Relative: 5 %
Neutro Abs: 5.2 10*3/uL (ref 1.7–7.7)
Neutrophils Relative %: 27 %
Platelet Count: 96 10*3/uL — ABNORMAL LOW (ref 150–400)
RBC: 3.9 MIL/uL (ref 3.87–5.11)
RDW: 15.7 % — ABNORMAL HIGH (ref 11.5–15.5)
Smear Review: NORMAL
WBC Count: 19.5 10*3/uL — ABNORMAL HIGH (ref 4.0–10.5)
nRBC: 0 % (ref 0.0–0.2)

## 2021-08-24 LAB — LACTATE DEHYDROGENASE: LDH: 188 U/L (ref 98–192)

## 2021-08-27 ENCOUNTER — Ambulatory Visit (INDEPENDENT_AMBULATORY_CARE_PROVIDER_SITE_OTHER): Payer: HMO

## 2021-08-27 ENCOUNTER — Ambulatory Visit: Payer: HMO | Admitting: Internal Medicine

## 2021-08-27 ENCOUNTER — Ambulatory Visit
Admission: EM | Admit: 2021-08-27 | Discharge: 2021-08-27 | Disposition: A | Discharge status: Home / Self Care | Payer: HMO | Attending: Internal Medicine | Admitting: Internal Medicine

## 2021-08-27 ENCOUNTER — Ambulatory Visit: Payer: HMO

## 2021-08-27 DIAGNOSIS — J069 Acute upper respiratory infection, unspecified: Secondary | ICD-10-CM | POA: Diagnosis not present

## 2021-08-27 DIAGNOSIS — R079 Chest pain, unspecified: Secondary | ICD-10-CM

## 2021-08-27 DIAGNOSIS — R0602 Shortness of breath: Secondary | ICD-10-CM | POA: Diagnosis not present

## 2021-08-27 DIAGNOSIS — R0789 Other chest pain: Secondary | ICD-10-CM | POA: Diagnosis not present

## 2021-08-27 DIAGNOSIS — R0781 Pleurodynia: Secondary | ICD-10-CM | POA: Diagnosis not present

## 2021-08-27 DIAGNOSIS — W19XXXA Unspecified fall, initial encounter: Secondary | ICD-10-CM

## 2021-08-27 NOTE — Discharge Instructions (Signed)
It appears that you have a viral upper respiratory infection that should resolve in the next few days with symptomatic treatment.  Your COVID-19 and flu test is pending.  We will call you if it is positive.  Your chest x-ray appears normal.  Please use your albuterol inhaler as needed for shortness of breath or chest heaviness.  You may also take over-the-counter cough medication as well as Flonase to help alleviate your symptoms.  Please follow-up if symptoms persist or worsen.

## 2021-08-27 NOTE — ED Triage Notes (Signed)
Pt c/o Her ears hurt Tuesday, "heavy in the chest," sore throat, cough, headache, diarrhea, says had a fall in Nov and chest wall hurts when breathing deeply.   Denies body aches or chills, nausea, vomiting, constipation   Onset Tuesday

## 2021-08-27 NOTE — ED Provider Notes (Signed)
[facility]    CSN: [id number] Arrival date & time: 08/27/21  1410      History   Chief Complaint Chief Complaint  Patient presents with   Cough    HPI Jonnelle Lawniczak is a 70 y.o. female.   Patient presents with bilateral ear pain, nonproductive cough, sore throat, nasal congestion, diarrhea that started approximately 3 days ago.  Denies any known fever.  She does report that she has had "heaviness in the chest" but denies shortness of breath or chest pain.  She has taken cetirizine for symptoms with minimal improvement.  She reports that she has also has some persistent left rib pain after a fall that occurred on 07/30/2021.  She reports that she tripped over a rug at home and landed onto her left side.  Denies hitting head or losing consciousness during this fall.  She was seen by PCP a few days after fall and a chest x-ray was completed that showed no fracture per patient.  Pain occurs mainly with deep breathing.   Cough  Past Medical History:  Diagnosis Date   Allergy    Asthma    seasonal   Cellulitis 2013   Left toe   Cirrhosis ([place])    Common migraine    History of   Complication of anesthesia    Per pt/had breathing problems with "block" during rotator cuff surgery. 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[place])     Patient Active Problem List   Diagnosis Date  Noted   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   Upper airway cough syndrome 04/29/2021   Cough variant asthma 04/29/2021   Liver cirrhosis secondary to NASH (nonalcoholic steatohepatitis) ([place]) 08/21/2020   Abdominal obesity and metabolic syndrome 26/37/8588   Class 2 severe obesity with serious comorbidity and body mass index (BMI) of 37.0 to 37.9 in adult ([place]) 08/21/2020   Thrombocytopenia ([place]) 08/21/2020   Moderate aortic regurgitation 03/09/2020   Asymptomatic stenosis of left carotid artery 03/09/2020   Primary localized osteoarthritis of left knee 03/27/2019   DDD (degenerative disc disease), lumbar    Obstructive sleep apnea treated with continuous positive airway pressure (CPAP) 08/27/2018   Asthma with acute exacerbation 12/07/2013   Diverticulitis 04/23/2012   Essential hypertension 04/23/2012   Controlled type 2 diabetes mellitus without complication, with long-term current use of insulin ([place]) 04/23/2012   Colon polyps 04/23/2012    Past Surgical History:  Procedure Laterality Date   ANKLE SURGERY     Left    COLONOSCOPY  05/2018   LEEP N/A 09/14/2018   Procedure: LOOP ELECTROSURGICAL EXCISION PROCEDURE (LEEP);  Surgeon: Arvella Nigh, MD;  [facility];  Service: Gynecology;  Laterality: N/A;   MASS EXCISION Right  06/24/2021   Procedure: EXCISION RIGHT POSTERIOR NECK MASS;  Surgeon: Kinsinger, [name], MD;  Location: [facility];  Service: General;  Laterality: Right;   ROTATOR CUFF REPAIR Bilateral 2012, 2015   TONSILLECTOMY     TOTAL KNEE ARTHROPLASTY Left 04/08/2019   Procedure: TOTAL KNEE ARTHROPLASTY;  Surgeon: Elsie Saas, MD;  Location: [facility];  Service: Orthopedics;  Laterality: Left;   UPPER GI ENDOSCOPY  05/2018    OB History   No obstetric history on file.      Home Medications    Prior to Admission medications   Medication Sig Start Date End Date Taking? Authorizing Provider  albuterol (PROVENTIL HFA;VENTOLIN HFA) 108  (90 Base) MCG/ACT inhaler Inhale 1-2 puffs into the lungs every 6 (six) hours as needed (Cough).    [provider]  amLODipine (NORVASC) 10 MG tablet Take 10 mg by mouth daily. 02/16/20   [provider]  Apoaequorin (PREVAGEN) 10 MG CAPS Take by mouth.    [provider]  aspirin EC 81 MG tablet Take 1 tablet (81 mg total) by mouth daily. Swallow whole. 03/09/20   Patwardhan, Reynold Bowen, MD  azelastine (ASTELIN) 0.1 % nasal spray Place 1 spray into both nostrils 2 (two) times daily. Use in each nostril as directed 04/29/21 07/28/21  Tanda Rockers, MD  Calcium-Magnesium-Zinc (CAL-MAG-ZINC PO) Take 2 tablets by mouth daily.    [provider]  Carboxymethylcellul-Glycerin (REFRESH OPTIVE PF OP) Place 1 drop into both eyes daily as needed (dry eyes). Non-pres    [provider]  clonazePAM (KLONOPIN) 0.5 MG disintegrating tablet 1 or 2 prior to MRI 06/28/21   Gatha Mayer, MD  Coenzyme Q10 (CO Q-10) 100 MG CAPS Take 100 mg by mouth daily.    [provider]  desoximetasone (TOPICORT) 0.25 % cream Apply 1 application topically 4 (four) times daily as needed (rash).    [provider]  diclofenac sodium (VOLTAREN) 1 % GEL Apply 2 g topically 2 (two) times daily as needed (knee pain/ foot /Triger finger). 03/27/18   [provider]  fluticasone (FLONASE) 50 MCG/ACT nasal spray Place 1 spray into both nostrils daily.    [provider]  gabapentin (NEURONTIN) 100 MG capsule Take 1 capsule (100 mg total) by mouth daily. One three times daily Patient taking differently: Take 600 mg by mouth daily with supper. 05/27/21   Tanda Rockers, MD  hydrALAZINE (APRESOLINE) 25 MG tablet Take 1 tablet (25 mg total) by mouth 2 (two) times daily. 07/06/21 11/03/21  Cantwell, Celeste C, PA-C  insulin NPH Human (HUMULIN N,NOVOLIN N) 100 UNIT/ML injection Inject 40-75 Units into the skin See admin instructions. Inject 45 units subcutaneously at  breakfast, 30 units at lunch & 45 units at supper    [provider]  insulin regular (NOVOLIN R,HUMULIN R) 100 units/mL injection Inject 30 Units into the skin 3 (three) times daily before meals.    [provider]  irbesartan (AVAPRO) 300 MG tablet Take 300 mg by mouth daily.    [provider]  metFORMIN (GLUCOPHAGE-XR) 500 MG 24 hr tablet Take 1,000 mg by mouth 2 (two) times daily.    [provider]  omeprazole (PRILOSEC) 40 MG capsule Take 40 mg by mouth 2 (two) times daily. 03/11/17   [provider]  PARoxetine (PAXIL) 10 MG tablet Take 10 mg by mouth daily. 06/08/21   [provider]  Probiotic Product (PROBIOTIC DAILY PO) Take 1 tablet by mouth daily.  [provider]  rosuvastatin (CRESTOR) 10 MG tablet Take 1 tablet (10 mg total) by mouth daily. Patient taking differently: Take 10 mg by mouth daily with supper. 03/08/21   Cantwell, Celeste C, PA-C  spironolactone (ALDACTONE) 50 MG tablet Take 1 tablet by mouth once daily 07/08/21   Levin Erp, PA  terconazole (TERAZOL 3) 0.8 % vaginal cream Place 1 applicator vaginally as needed (yeast infections).     [provider]  vitamin C (ASCORBIC ACID) 500 MG tablet Take 500 mg by mouth 2 (two) times a day.     [provider]    Family History Family History  Problem Relation Age of Onset   Breast cancer Mother    Bone cancer Mother    Heart failure Father    Diabetes Mellitus II Father    Hypertension Father    Diabetes Mellitus II Sister    Obesity Sister    Other Sister        retina problem   Hypertension Brother    Diabetes Mellitus II Brother    Colon cancer Neg Hx    Rectal cancer Neg Hx    Stomach cancer Neg Hx    Esophageal cancer Neg Hx    Pancreatic cancer Neg Hx    Liver disease Neg Hx     Social History Social History   Tobacco Use   Smoking status: Never   Smokeless tobacco: Never  Vaping Use   Vaping Use: Never  used  Substance Use Topics   Alcohol use: Not Currently    Comment: very rare   Drug use: No     Allergies   Benzonatate, Fluticasone furoate-vilanterol, Iodine, Latex, Simvastatin, Bupropion, Levofloxacin, Penicillins, Povidone-iodine, Amoxicillin, Chlorthalidone, Diflucan [fluconazole], Amoxicillin-pot clavulanate, and Doxycycline   Review of Systems Review of Systems Per HPI  Physical Exam Triage Vital Signs ED Triage Vitals  Enc Vitals Group     BP 08/27/21 1502 (!) 157/70     Pulse Rate 08/27/21 1502 88     Resp 08/27/21 1502 18     Temp 08/27/21 1502 98.2 F (36.8 C)     Temp Source 08/27/21 1502 Oral     SpO2 08/27/21 1502 98 %     Weight --      Height --      Head Circumference --      Peak Flow --      Pain Score 08/27/21 1503 0     Pain Loc --      Pain Edu? --      Excl. in [place]? --    No data found.  Updated Vital Signs BP (!) 157/70 (BP Location: Right Arm)    Pulse 88    Temp 98.2 F (36.8 C) (Oral)    Resp 18    SpO2 98%   Visual Acuity Right Eye Distance:   Left Eye Distance:   Bilateral Distance:    Right Eye Near:   Left Eye Near:    Bilateral Near:     Physical Exam Constitutional:      General: She is not in acute distress.    Appearance: Normal appearance. She is not toxic-appearing or diaphoretic.  HENT:     Head: Normocephalic and atraumatic.     Right Ear: Tympanic membrane and ear canal normal.     Left Ear: Tympanic membrane and ear canal normal.     Nose: Congestion present.     Mouth/Throat:     Mouth: Mucous  membranes are moist.     Pharynx: No posterior oropharyngeal erythema.  Eyes:     Extraocular Movements: Extraocular movements intact.     Conjunctiva/sclera: Conjunctivae normal.     Pupils: Pupils are equal, round, and reactive to light.  Cardiovascular:     Rate and Rhythm: Normal rate and regular rhythm.     Pulses: Normal pulses.     Heart sounds: Normal heart sounds.  Pulmonary:     Effort: Pulmonary effort  is normal. No respiratory distress.     Breath sounds: Normal breath sounds. No stridor. No wheezing, rhonchi or rales.  Chest:     Chest wall: Tenderness present.       Comments: Tenderness to palpation to mid ribs.  No bruising noted. Abdominal:     General: Abdomen is flat. Bowel sounds are normal.     Palpations: Abdomen is soft.  Musculoskeletal:        General: Normal range of motion.     Cervical back: Normal range of motion.  Skin:    General: Skin is warm and dry.  Neurological:     General: No focal deficit present.     Mental Status: She is alert and oriented to person, place, and time. Mental status is at baseline.  Psychiatric:        Mood and Affect: Mood normal.        Behavior: Behavior normal.     UC Treatments / Results  Labs (all labs ordered are listed, but only abnormal results are displayed) Labs Reviewed  COVID-19, FLU A+B NAA    EKG   Radiology DG Chest 2 View  Result Date: 08/27/2021 CLINICAL DATA:  Short of breath EXAM: CHEST - 2 VIEW COMPARISON:  09/28/2013 FINDINGS: The heart size and mediastinal contours are within normal limits. Both lungs are clear. The visualized skeletal structures are unremarkable. IMPRESSION: No active cardiopulmonary disease. Electronically Signed   By: Franchot Gallo M.D.   On: 08/27/2021 16:06   DG Ribs Unilateral Left  Result Date: 08/27/2021 CLINICAL DATA:  Left chest pain EXAM: LEFT RIBS - 2 VIEW COMPARISON:  08/27/2021 FINDINGS: Lower ribs are image.  No fracture or bone lesion identified. IMPRESSION: Negative left lower ribs. Electronically Signed   By: Franchot Gallo M.D.   On: 08/27/2021 16:06    Procedures Procedures (including critical care time)  Medications Ordered in UC Medications - No data to display  Initial Impression / Assessment and Plan / UC Course  I have reviewed the triage vital signs and the nursing notes.  Pertinent labs & imaging results that were available during my care of the  patient were reviewed by me and considered in my medical decision making (see chart for details).     Patient presents with symptoms likely from a viral upper respiratory infection. Differential includes bacterial pneumonia, sinusitis, allergic rhinitis, COVID-19, flu. Do not suspect underlying cardiopulmonary process. Symptoms seem unlikely related to ACS, CHF or COPD exacerbations, pneumonia, pneumothorax. Patient is nontoxic appearing and not in need of emergent medical intervention.  Covid 19 and flu test pending.  Recommended symptom control with over the counter medications that are safe with high blood pressure.  Patient was offered several medications to help alleviate symptoms but declined all of them.  She reports that she has Flonase at home and is able to take that without allergic reaction.  Chest x-ray and left rib x-ray were both negative for any acute cardiopulmonary process or bony abnormality.  Suspect rib  contusion that is causing persistent rib pain.  Advised patient this can be persistent for multiple months.  Educated patient on deep breathing techniques.  Return if symptoms fail to improve in 1-2 weeks or you develop shortness of breath, chest pain, severe headache. Patient states understanding and is agreeable.  Discharged with PCP followup.  Final Clinical Impressions(s) / UC Diagnoses   Final diagnoses:  Viral upper respiratory tract infection with cough  Fall  Rib pain on left side     Discharge Instructions      It appears that you have a viral upper respiratory infection that should resolve in the next few days with symptomatic treatment.  Your COVID-19 and flu test is pending.  We will call you if it is positive.  Your chest x-ray appears normal.  Please use your albuterol inhaler as needed for shortness of breath or chest heaviness.  You may also take over-the-counter cough medication as well as Flonase to help alleviate your symptoms.  Please follow-up if  symptoms persist or worsen.     ED Prescriptions   None    PDMP not reviewed this encounter.   Teodora Medici, [place] 08/27/21 [phone]

## 2021-08-28 LAB — COVID-19, FLU A+B NAA
Influenza A, NAA: NOT DETECTED
Influenza B, NAA: NOT DETECTED
SARS-CoV-2, NAA: NOT DETECTED

## 2021-08-29 NOTE — Progress Notes (Signed)
HEMATOLOGY/ONCOLOGY CLINIC NOTE  Date of Service: .08/24/2021   Patient Care Team: Prince Solian, MD as PCP - General (Internal Medicine)  CHIEF COMPLAINTS/PURPOSE OF CONSULTATION:  F/u for CLL/SLL  HISTORY OF PRESENTING ILLNESS:  Plz see previous notes for details on initial presentation.  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 Notes some abdominal fullness.  No overt bleeding. No fevers/chils/night sweats/unexpected weight loss.  She was seen in f/u with Dr Carlean Purl for her liver cirrhosis and had a MRI abd on 07/08/2021 which showed. . Extensive abdominal adenopathy, similar to slightly increased compared to 05/01/2020 PET. 3. Cirrhosis and hepatosplenomegaly with suspicion of periesophageal varices. 4. Nonspecific hypoenhancing or nonenhancing splenic lesions. These could simply represent Crissie Figures bodies in the setting of cirrhosis. Lymphomatous involvement cannot be excluded. 5. Heterogeneous enhancement throughout the liver, without well-defined suspicious mass. Possibly due to altered perfusion in the setting of cirrhosis and porta hepatis adenopathy. Lymphomatous involvement cannot be excluded. Consider pre and post-contrast MRI follow-up at 2-3 months. A more aggressive approach would include random liver biopsy.  Patients labs from 08/24/2021 were reviewed with her  CBC shows WBC count of 19.5k, hemoglobin of 10.4, platelets of 96k. CMP within normal limits other than glucose level of 229. LDH 188.   MEDICAL HISTORY:  Past Medical History:  Diagnosis Date   Allergy    Asthma    seasonal   Cellulitis 2013   Left toe   Cirrhosis ([place])    Common migraine    History of   Complication of anesthesia    Per pt/had breathing problems with "block" during rotator cuff surgery. 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[place])     SURGICAL HISTORY: Past Surgical History:  Procedure Laterality Date   ANKLE SURGERY     Left    COLONOSCOPY  05/2018   LEEP N/A 09/14/2018   Procedure: LOOP ELECTROSURGICAL EXCISION PROCEDURE (LEEP);  Surgeon: Arvella Nigh, MD;  Location: [facility];  Service: Gynecology;  Laterality: N/A;   MASS EXCISION Right 06/24/2021   Procedure: EXCISION RIGHT POSTERIOR NECK MASS;  Surgeon: Kinsinger, [name], MD;  Location: [facility];  Service: General;  Laterality: Right;   ROTATOR CUFF REPAIR Bilateral 2012, 2015   TONSILLECTOMY     TOTAL KNEE ARTHROPLASTY Left 04/08/2019   Procedure: TOTAL KNEE ARTHROPLASTY;  Surgeon: Elsie Saas, MD;  Location: [facility];  Service: Orthopedics;  Laterality: Left;   UPPER GI ENDOSCOPY  05/2018    SOCIAL HISTORY: Social History   Socioeconomic History   Marital status: Widowed    Spouse name: Not on file   Number of children:  0   Years of education: 3 years college   Highest education level: Not on file  Occupational History   Occupation: Unemployed  Tobacco Use   Smoking status: Never   Smokeless tobacco: Never  Vaping Use   Vaping Use: Never used  Substance and Sexual Activity   Alcohol use: Not Currently    Comment: very rare   Drug use: No   Sexual activity: Not Currently    Birth control/protection: None, Post-menopausal  Other Topics Concern   Not on file  Social History  Narrative   1 caffeine drink daily    Right-handed   Lives at home with husband.   Social Determinants of Radio broadcast assistant Strain: Not on file  Food Insecurity: Not on file  Transportation Needs: Not on file  Physical Activity: Not on file  Stress: Not on file  Social Connections: Not on file  Intimate Partner Violence: Not on file    FAMILY HISTORY: Family History  Problem Relation Age of Onset   Breast cancer Mother    Bone cancer Mother    Heart failure Father    Diabetes Mellitus II Father    Hypertension Father    Diabetes Mellitus II Sister    Obesity Sister    Other Sister        retina problem   Hypertension Brother    Diabetes Mellitus II Brother    Colon cancer Neg Hx    Rectal cancer Neg Hx    Stomach cancer Neg Hx    Esophageal cancer Neg Hx    Pancreatic cancer Neg Hx    Liver disease Neg Hx     ALLERGIES:  is allergic to benzonatate, fluticasone furoate-vilanterol, iodine, latex, simvastatin, bupropion, levofloxacin, penicillins, povidone-iodine, amoxicillin, chlorthalidone, diflucan [fluconazole], amoxicillin-pot clavulanate, and doxycycline.  MEDICATIONS:  Current Outpatient Medications  Medication Sig Dispense Refill   albuterol (PROVENTIL HFA;VENTOLIN HFA) 108 (90 Base) MCG/ACT inhaler Inhale 1-2 puffs into the lungs every 6 (six) hours as needed (Cough).     amLODipine (NORVASC) 10 MG tablet Take 10 mg by mouth daily.     Apoaequorin (PREVAGEN) 10 MG CAPS Take by mouth.     aspirin EC 81 MG tablet Take 1 tablet (81 mg total) by mouth daily. Swallow whole. 90 tablet 3   azelastine (ASTELIN) 0.1 % nasal spray Place 1 spray into both nostrils 2 (two) times daily. Use in each nostril as directed 90 mL 3   Calcium-Magnesium-Zinc (CAL-MAG-ZINC PO) Take 2 tablets by mouth daily.     Carboxymethylcellul-Glycerin (REFRESH OPTIVE PF OP) Place 1 drop into both eyes daily as needed (dry eyes). Non-pres     clonazePAM (KLONOPIN) 0.5 MG disintegrating  tablet 1 or 2 prior to MRI 2 tablet 0   Coenzyme Q10 (CO Q-10) 100 MG CAPS Take 100 mg by mouth daily.     desoximetasone (TOPICORT) 0.25 % cream Apply 1 application topically 4 (four) times daily as needed (rash).     diclofenac sodium (VOLTAREN) 1 % GEL Apply 2 g topically 2 (two) times daily as needed (knee pain/ foot /Triger finger).  2   fluticasone (FLONASE) 50 MCG/ACT nasal spray Place 1 spray into both nostrils daily.     gabapentin (NEURONTIN) 100 MG capsule Take 1 capsule (100 mg total) by mouth daily. One three times daily (Patient taking differently: Take 600 mg by mouth daily with supper.) 90 capsule 2   hydrALAZINE (APRESOLINE) 25 MG tablet Take 1 tablet (25 mg total)  by mouth 2 (two) times daily. 60 tablet 3   insulin NPH Human (HUMULIN N,NOVOLIN N) 100 UNIT/ML injection Inject 40-75 Units into the skin See admin instructions. Inject 45 units subcutaneously at breakfast, 30 units at lunch & 45 units at supper     insulin regular (NOVOLIN R,HUMULIN R) 100 units/mL injection Inject 30 Units into the skin 3 (three) times daily before meals.     irbesartan (AVAPRO) 300 MG tablet Take 300 mg by mouth daily.     metFORMIN (GLUCOPHAGE-XR) 500 MG 24 hr tablet Take 1,000 mg by mouth 2 (two) times daily.     omeprazole (PRILOSEC) 40 MG capsule Take 40 mg by mouth 2 (two) times daily.     PARoxetine (PAXIL) 10 MG tablet Take 10 mg by mouth daily.     Probiotic Product (PROBIOTIC DAILY PO) Take 1 tablet by mouth daily.     rosuvastatin (CRESTOR) 10 MG tablet Take 1 tablet (10 mg total) by mouth daily. (Patient taking differently: Take 10 mg by mouth daily with supper.) 90 tablet 3   spironolactone (ALDACTONE) 50 MG tablet Take 1 tablet by mouth once daily 90 tablet 3   terconazole (TERAZOL 3) 0.8 % vaginal cream Place 1 applicator vaginally as needed (yeast infections).      vitamin C (ASCORBIC ACID) 500 MG tablet Take 500 mg by mouth 2 (two) times a day.      No current facility-administered  medications for this visit.    REVIEW OF SYSTEMS:   .10 Point review of Systems was done is negative except as noted above.    PHYSICAL EXAMINATION: ECOG PERFORMANCE STATUS: 1 - Symptomatic but completely ambulatory  . GENERAL:alert, in no acute distress and comfortable SKIN: no acute rashes, no significant lesions EYES: conjunctiva are pink and non-injected, sclera anicteric OROPHARYNX: MMM, no exudates, no oropharyngeal erythema or ulceration NECK: supple, no JVD LYMPH:  no palpable lymphadenopathy in the cervical, axillary or inguinal regions LUNGS: clear to auscultation b/l with normal respiratory effort HEART: regular rate & rhythm ABDOMEN:  normoactive bowel sounds , non tender, borderline palpable hepatomegaly, splenomegaly palpable 2 to 3 fingerbreadths below costal margin Extremity: Trace bilateral pedal edema PSYCH: alert & oriented x 3 with fluent speech NEURO: no focal motor/sensory deficits   LABORATORY DATA:  I have reviewed the data as listed  . CBC Latest Ref Rng & Units 08/24/2021 06/07/2021 05/18/2021  WBC 4.0 - 10.5 K/uL 19.5(H) 17.5(H) 12.5(H)  Hemoglobin 12.0 - 15.0 g/dL 10.4(L) 11.9(L) 10.6(L)  Hematocrit 36.0 - 46.0 % 33.2(L) 38.7 33.0(L)  Platelets 150 - 400 K/uL 96(L) 105(L) 87(L)   . CBC    Component Value Date/Time   WBC 19.5 (H) 08/24/2021 0940   WBC 17.5 (H) 06/07/2021 1149   RBC 3.90 08/24/2021 0940   HGB 10.4 (L) 08/24/2021 0940   HGB 12.8 03/11/2020 1315   HCT 33.2 (L) 08/24/2021 0940   HCT 40.1 03/11/2020 1315   PLT 96 (L) 08/24/2021 0940   PLT 99 (LL) 03/11/2020 1315   MCV 85.1 08/24/2021 0940   MCV 87 03/11/2020 1315   MCH 26.7 08/24/2021 0940   MCHC 31.3 08/24/2021 0940   RDW 15.7 (H) 08/24/2021 0940   RDW 14.0 03/11/2020 1315   LYMPHSABS 13.0 (H) 08/24/2021 0940   MONOABS 0.9 08/24/2021 0940   EOSABS 0.2 08/24/2021 0940   BASOSABS 0.1 08/24/2021 0940     . CMP Latest Ref Rng & Units 08/24/2021 07/06/2021 06/07/2021   Glucose 70 - 99  mg/dL 229(H) 210(H) 141(H)  BUN 8 - 23 mg/dL 14 14 15   Creatinine 0.44 - 1.00 mg/dL 0.82 0.75 0.67  Sodium 135 - 145 mmol/L 137 138 138  Potassium 3.5 - 5.1 mmol/L 4.6 4.9 4.9  Chloride 98 - 111 mmol/L 105 101 105  CO2 22 - 32 mmol/L 24 23 27   Calcium 8.9 - 10.3 mg/dL 8.7(L) 9.4 9.1  Total Protein 6.5 - 8.1 g/dL 6.2(L) - 6.7  Total Bilirubin 0.3 - 1.2 mg/dL 0.5 - 0.9  Alkaline Phos 38 - 126 U/L 82 - 67  AST 15 - 41 U/L 14(L) - 22  ALT 0 - 44 U/L 19 - 25   . Lab Results  Component Value Date   LDH 188 08/24/2021    04/08/2020 FISH/CLL Panel:   03/30/2020 Left Axilla Flow Pathology Report [id number]):   03/30/2020 Left axilla lymph node Bx ([id number]):   RADIOGRAPHIC STUDIES: I have personally reviewed the radiological images as listed and agreed with the findings in the report. DG Chest 2 View  Result Date: 08/27/2021 CLINICAL DATA:  Short of breath EXAM: CHEST - 2 VIEW COMPARISON:  09/28/2013 FINDINGS: The heart size and mediastinal contours are within normal limits. Both lungs are clear. The visualized skeletal structures are unremarkable. IMPRESSION: No active cardiopulmonary disease. Electronically Signed   By: Franchot Gallo M.D.   On: 08/27/2021 16:06   DG Ribs Unilateral Left  Result Date: 08/27/2021 CLINICAL DATA:  Left chest pain EXAM: LEFT RIBS - 2 VIEW COMPARISON:  08/27/2021 FINDINGS: Lower ribs are image.  No fracture or bone lesion identified. IMPRESSION: Negative left lower ribs. Electronically Signed   By: Franchot Gallo M.D.   On: 08/27/2021 16:06    MRI abdomen with and without contrast 07/08/2021 showed IMPRESSION: 1. Moderate motion and patient body habitus degraded exam. 2. Extensive abdominal adenopathy, similar to slightly increased compared to 05/01/2020 PET. 3. Cirrhosis and hepatosplenomegaly with suspicion of periesophageal varices. 4. Nonspecific hypoenhancing or nonenhancing splenic lesions. These could simply  represent Crissie Figures bodies in the setting of cirrhosis. Lymphomatous involvement cannot be excluded. 5. Heterogeneous enhancement throughout the liver, without well-defined suspicious mass. Possibly due to altered perfusion in the setting of cirrhosis and porta hepatis adenopathy. Lymphomatous involvement cannot be excluded. Consider pre and post-contrast MRI follow-up at 2-3 months. A more aggressive approach would include random liver biopsy.  ASSESSMENT & PLAN:   70 yo with   1) CLL/SLL -02/28/2020 MM Breast ([id number]) revealed "Bilateral axillary adenopathy." -03/30/2020 Left axilla lymph node biopsy ([id number]) revealed "CHRONIC LYMPHOCYTIC LYMPHOMA/SMALL LYMPHOCYTIC LYMPHOMA".  -03/30/2020 Left Axilla Flow Pathology Report [id number]) revealed "Monoclonal B-cell population with coexpression of CD5 comprises 80% of all lymphocytes". -04/20/20 PET/CT revealed 1. Adenopathy within the neck, chest, abdomen, and pelvis, consistent with active lymphoma. Much of this is not significantly hypermetabolic. Some upper abdominal nodes are moderately hypermetabolic. 2) Mild thrombocytopenia PLT 96k Most likely related to advanced liver cirrhosis, less likely related to CLL  PLAN: -Discussed pt labwork today, 08/24/2021.CBC shows WBC count of 19.5k, hemoglobin of 10.4, platelets of 96k. CMP within normal limits other than glucose level of 229. LDH 188.  MRI of the abdomen shows liver cirrhosis with hepatosplenomegaly and periesophageal varices.  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   Splenic lesions and some element of the hepatomegaly could potentially represent involvement by SLL. However overall lymphadenopathy and blood counts are only minimally progressive for a fair period of time.  Would recommend repeat MRI  imaging with Dr. Carlean Purl in 3 to 4 months.  If there is continued significant increase in hepatomegaly, worsening of liver functions or  increasing splenic lesions could consider earlier consideration for treatment of the patient's CLL/SLL.  Again we discussed with the patient the need for optimization of her diabetes management with her primary care physician.  No acute indication to start treatment with the patient CLL/SLL at this time.  FOLLOW UP: RTC with Dr Irene Limbo with labs in 4 months  All of the patient's questions were answered with apparent satisfaction. The patient knows to call the clinic with any problems, questions or concerns.    Sullivan Lone MD [place] AAHIVMS [facility] Hematology/Oncology Physician [facility]  (Office):       [phone] (Work cell):  [phone] (Fax):           [phone]

## 2021-09-07 DIAGNOSIS — Z124 Encounter for screening for malignant neoplasm of cervix: Secondary | ICD-10-CM | POA: Diagnosis not present

## 2021-09-08 DIAGNOSIS — E119 Type 2 diabetes mellitus without complications: Secondary | ICD-10-CM | POA: Diagnosis not present

## 2021-09-08 DIAGNOSIS — J069 Acute upper respiratory infection, unspecified: Secondary | ICD-10-CM | POA: Diagnosis not present

## 2021-09-08 DIAGNOSIS — I10 Essential (primary) hypertension: Secondary | ICD-10-CM | POA: Diagnosis not present

## 2021-09-08 DIAGNOSIS — R051 Acute cough: Secondary | ICD-10-CM | POA: Diagnosis not present

## 2021-09-09 DIAGNOSIS — E119 Type 2 diabetes mellitus without complications: Secondary | ICD-10-CM | POA: Diagnosis not present

## 2021-09-09 DIAGNOSIS — E782 Mixed hyperlipidemia: Secondary | ICD-10-CM | POA: Diagnosis not present

## 2021-09-09 DIAGNOSIS — N182 Chronic kidney disease, stage 2 (mild): Secondary | ICD-10-CM | POA: Diagnosis not present

## 2021-09-09 DIAGNOSIS — I129 Hypertensive chronic kidney disease with stage 1 through stage 4 chronic kidney disease, or unspecified chronic kidney disease: Secondary | ICD-10-CM | POA: Diagnosis not present

## 2021-09-09 DIAGNOSIS — Z794 Long term (current) use of insulin: Secondary | ICD-10-CM | POA: Diagnosis not present

## 2021-09-15 ENCOUNTER — Other Ambulatory Visit: Payer: HMO

## 2021-09-15 ENCOUNTER — Ambulatory Visit: Payer: HMO | Admitting: Hematology

## 2021-09-30 ENCOUNTER — Other Ambulatory Visit (HOSPITAL_COMMUNITY): Payer: Self-pay | Admitting: Physician Assistant

## 2021-09-30 ENCOUNTER — Encounter (HOSPITAL_COMMUNITY): Payer: HMO

## 2021-09-30 DIAGNOSIS — M7989 Other specified soft tissue disorders: Secondary | ICD-10-CM

## 2021-09-30 DIAGNOSIS — M545 Low back pain, unspecified: Secondary | ICD-10-CM | POA: Diagnosis not present

## 2021-09-30 DIAGNOSIS — M79604 Pain in right leg: Secondary | ICD-10-CM

## 2021-09-30 DIAGNOSIS — M25561 Pain in right knee: Secondary | ICD-10-CM | POA: Diagnosis not present

## 2021-10-05 ENCOUNTER — Ambulatory Visit (HOSPITAL_COMMUNITY)
Admission: RE | Admit: 2021-10-05 | Discharge: 2021-10-05 | Disposition: A | Discharge status: Home / Self Care | Payer: HMO | Source: Ambulatory Visit | Attending: Orthopedic Surgery | Admitting: Orthopedic Surgery

## 2021-10-05 ENCOUNTER — Other Ambulatory Visit: Payer: Self-pay

## 2021-10-05 DIAGNOSIS — M79604 Pain in right leg: Secondary | ICD-10-CM | POA: Diagnosis not present

## 2021-10-05 DIAGNOSIS — M7989 Other specified soft tissue disorders: Secondary | ICD-10-CM | POA: Diagnosis not present

## 2021-10-07 ENCOUNTER — Telehealth: Payer: Self-pay | Admitting: Student

## 2021-10-07 DIAGNOSIS — F329 Major depressive disorder, single episode, unspecified: Secondary | ICD-10-CM | POA: Diagnosis not present

## 2021-10-07 DIAGNOSIS — C8300 Small cell B-cell lymphoma, unspecified site: Secondary | ICD-10-CM | POA: Diagnosis not present

## 2021-10-07 DIAGNOSIS — K760 Fatty (change of) liver, not elsewhere classified: Secondary | ICD-10-CM | POA: Diagnosis not present

## 2021-10-07 DIAGNOSIS — R188 Other ascites: Secondary | ICD-10-CM | POA: Diagnosis not present

## 2021-10-07 DIAGNOSIS — I10 Essential (primary) hypertension: Secondary | ICD-10-CM | POA: Diagnosis not present

## 2021-10-07 DIAGNOSIS — R221 Localized swelling, mass and lump, neck: Secondary | ICD-10-CM | POA: Diagnosis not present

## 2021-10-07 DIAGNOSIS — E119 Type 2 diabetes mellitus without complications: Secondary | ICD-10-CM | POA: Diagnosis not present

## 2021-10-07 DIAGNOSIS — Z13828 Encounter for screening for other musculoskeletal disorder: Secondary | ICD-10-CM | POA: Diagnosis not present

## 2021-10-07 DIAGNOSIS — R0600 Dyspnea, unspecified: Secondary | ICD-10-CM | POA: Diagnosis not present

## 2021-10-07 DIAGNOSIS — K7460 Unspecified cirrhosis of liver: Secondary | ICD-10-CM | POA: Diagnosis not present

## 2021-10-07 DIAGNOSIS — G629 Polyneuropathy, unspecified: Secondary | ICD-10-CM | POA: Diagnosis not present

## 2021-10-07 NOTE — Telephone Encounter (Signed)
I dont have anything in my last note about PT, would recommend she reach out to her PCP.

## 2021-10-07 NOTE — Telephone Encounter (Signed)
Please call patient when you can.

## 2021-10-07 NOTE — Telephone Encounter (Signed)
Patient is aware DVT scan results. Patient stated that she will reconsider PT now.

## 2021-10-08 NOTE — Telephone Encounter (Signed)
Attempted to call pt, no answer. Left vm requesting call back.

## 2021-10-11 ENCOUNTER — Other Ambulatory Visit: Payer: HMO

## 2021-10-11 ENCOUNTER — Other Ambulatory Visit: Payer: Self-pay | Admitting: Internal Medicine

## 2021-10-11 ENCOUNTER — Ambulatory Visit: Payer: HMO | Admitting: Internal Medicine

## 2021-10-11 ENCOUNTER — Encounter: Payer: Self-pay | Admitting: Internal Medicine

## 2021-10-11 VITALS — BP 142/58 | HR 76 | Ht 64.5 in | Wt 220.0 lb

## 2021-10-11 DIAGNOSIS — K7460 Unspecified cirrhosis of liver: Secondary | ICD-10-CM | POA: Diagnosis not present

## 2021-10-11 DIAGNOSIS — R932 Abnormal findings on diagnostic imaging of liver and biliary tract: Secondary | ICD-10-CM

## 2021-10-11 DIAGNOSIS — K7581 Nonalcoholic steatohepatitis (NASH): Secondary | ICD-10-CM

## 2021-10-11 NOTE — Progress Notes (Signed)
Janice [name] 71 y.o. 1951-04-28 [id number]  Assessment & Plan:   Encounter Diagnoses  Name Primary?   Liver cirrhosis secondary to NASH (nonalcoholic steatohepatitis) (HCC) Yes   Abnormal MRI, liver      She appears to have compensated cirrhosis.  Imaging has suggested the possibility of malignancy of the liver hepatocellular carcinoma versus her lymphoma.  I told her that statistically I think it is unlikely that she has problems like that but we cannot tell.  She has a problem lying for the MRI and apparently an open MRI is not good enough for liver imaging so she had to do 1 that was larger but it was still a problem for her.  In addition she is concerned about costs.  I am going to have her do an alpha-fetoprotein.  If it is significantly elevated then we may have an answer.  If its not significantly elevated then follow-up imaging as per radiology would be the recommendation but it will depend upon her willingness to do so.  I understand where she is coming from and tried to explain this as best possible.  Ultimately it will be her decision as to how to proceed with more imaging or not.  CT scan is not possible due to a contrast allergy.  Regarding her liver status she is a [place] [facility] [place]  Regarding colonoscopy she is slated to have another one routinely in 2024.  CC: Avva, Ravisankar, MD   Subjective:   Chief Complaint: Follow-up of cirrhosis and abnormal MRI  HPI Janice [name] is a 71 year old woman who was diagnosed with [name] related cirrhosis of the liver last year (after her husband died of the same problem) who had abnormal ultrasound and MRI as part of her work-up.  She also has small lymphocytic lymphoma.   In September she had an ultrasound as below:   IMPRESSION: 1. Limited exam due to patient's body habitus. No gallstones. 2 mm gallbladder polyp. Gallbladder wall is thickened at 6 mm. Although cholecystitis can not be excluded, these findings may be  related to hypoproteinemic state. No biliary distention. Negative Murphy sign.   2. Hyperechoic foci are noted in the liver and spleen. Differential diagnosis includes hemangiomas, metastatic disease, and lymphoma.   3. Heterogeneous hepatic parenchymal pattern with slightly nodular hepatic contour consistent with the patient's known history of cirrhosis.   4.  Splenomegaly to 19.6 cm.   5. Punctate hyperechoic nonshadowing focus noted in the left kidney. This could represent a nonshadowing cortical calcification or tiny angiomyolipoma. No associated mass.   An MRI July 12, 2021 to follow-up as below: (This was difficult for her we asked for an open but apparently that is not sensitive enough to examine the liver so she had to go onto a larger MRI table but it was still problematic with a lot of pain lying there and coughing etc. and some of the claustrophobia issues)  IMPRESSION: 1. Moderate motion and patient body habitus degraded exam. 2. Extensive abdominal adenopathy, similar to slightly increased compared to 05/01/2020 PET. 3. Cirrhosis and hepatosplenomegaly with suspicion of periesophageal varices. 4. Nonspecific hypoenhancing or nonenhancing splenic lesions. These could simply represent Crissie Figures bodies in the setting of cirrhosis. Lymphomatous involvement cannot be excluded. 5. Heterogeneous enhancement throughout the liver, without well-defined suspicious mass. Possibly due to altered perfusion in the setting of cirrhosis and porta hepatis adenopathy. Lymphomatous involvement cannot be excluded. Consider pre and post-contrast MRI follow-up at 2-3 months. A more aggressive approach would include  random liver biopsy.   She has followed up with Dr. Irene Limbo and he has thought repeating an MRI makes sense.  He does not think she has recurrent lymphoma at this point.  She also had an EGD to screen for varices and did not have any.  Some antral nodules that were reactive  gastropathy.  No signs of portal hypertension.  Recall that her husband died of liver failure from [place] last year.  She ended up selling her house to pay all of his bills and her medical bills.  She is concerned about cost of ongoing work-up and testing.   Allergies  Allergen Reactions   Benzonatate Anaphylaxis and Hives   Fluticasone Furoate-Vilanterol Anaphylaxis and Hives    Other reaction(s): hives  breo ellipta   Iodine Hives, Swelling and Other (See Comments)    And Betadine/caused hives and swelling   Latex Hives, Itching, Rash and Other (See Comments)    Burning   Simvastatin Other (See Comments)    Caused neuropathy in arms Neuropathy in arms   Bupropion Other (See Comments)    Negative thoughts. Crazy thoughts   Levofloxacin Other (See Comments)    Joint aches//leg, shoulder pain Joint pain   Penicillins Hives   Povidone-Iodine Hives   Amoxicillin Hives    Blisters Did it involve swelling of the face/tongue/throat, SOB, or low BP? Yes Did it involve sudden or severe rash/hives, skin peeling, or any reaction on the inside of your mouth or nose? Yes Did you need to seek medical attention at a hospital or doctor's office?Unknown When did it last happen? 2019      If all above answers are NO, may proceed with cephalosporin use.    Chlorthalidone Other (See Comments)    Ears ringing    Diflucan [Fluconazole] Hives and Other (See Comments)    blister   Amoxicillin-Pot Clavulanate Rash   Doxycycline Itching and Diarrhea   Current Meds  Medication Sig   albuterol (PROVENTIL HFA;VENTOLIN HFA) 108 (90 Base) MCG/ACT inhaler Inhale 1-2 puffs into the lungs every 6 (six) hours as needed (Cough).   amLODipine (NORVASC) 10 MG tablet Take 10 mg by mouth daily.   aspirin EC 81 MG tablet Take 1 tablet (81 mg total) by mouth daily. Swallow whole.   Calcium-Magnesium-Zinc (CAL-MAG-ZINC PO) Take 2 tablets by mouth daily.   Carboxymethylcellul-Glycerin (REFRESH OPTIVE PF OP)  Place 1 drop into both eyes daily as needed (dry eyes). Non-pres   Coenzyme Q10 (CO Q-10) 100 MG CAPS Take 100 mg by mouth daily.   desoximetasone (TOPICORT) 0.25 % cream Apply 1 application topically 4 (four) times daily as needed (rash).   diclofenac sodium (VOLTAREN) 1 % GEL Apply 2 g topically 2 (two) times daily as needed (knee pain/ foot /Triger finger).   fluticasone (FLONASE) 50 MCG/ACT nasal spray Place 1 spray into both nostrils daily.   gabapentin (NEURONTIN) 300 MG capsule 600 mg daily.   hydrALAZINE (APRESOLINE) 25 MG tablet Take 1 tablet (25 mg total) by mouth 2 (two) times daily.   insulin NPH Human (HUMULIN N,NOVOLIN N) 100 UNIT/ML injection Inject 40-75 Units into the skin See admin instructions. Inject 45 units subcutaneously at breakfast, 30 units at lunch & 45 units at supper   insulin regular (NOVOLIN R,HUMULIN R) 100 units/mL injection Inject 30 Units into the skin 3 (three) times daily before meals.   irbesartan (AVAPRO) 300 MG tablet Take 300 mg by mouth daily.   metFORMIN (GLUCOPHAGE-XR) 500 MG 24 hr tablet  Take 1,000 mg by mouth 2 (two) times daily.   omeprazole (PRILOSEC) 40 MG capsule Take 40 mg by mouth 2 (two) times daily.   PARoxetine (PAXIL) 10 MG tablet Take 10 mg by mouth daily.   rosuvastatin (CRESTOR) 10 MG tablet Take 1 tablet (10 mg total) by mouth daily. (Patient taking differently: Take 10 mg by mouth daily with supper.)   spironolactone (ALDACTONE) 50 MG tablet Take 1 tablet by mouth once daily   terconazole (TERAZOL 3) 0.8 % vaginal cream Place 1 applicator vaginally as needed (yeast infections).    vitamin C (ASCORBIC ACID) 500 MG tablet Take 500 mg by mouth 2 (two) times a day.    Wheat Dextrin (BENEFIBER PO) Take by mouth. Take 3/4 tsp daily   Past Medical History:  Diagnosis Date   Allergy    Asthma    seasonal   Cellulitis 2013   Left toe   Cirrhosis ([place])    Common migraine    History of   Complication of anesthesia    Per pt/had breathing  problems with "block" during rotator cuff surgery. 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[place])    Past Surgical History:  Procedure Laterality Date   ANKLE SURGERY     Left    COLONOSCOPY  05/2018   LEEP N/A 09/14/2018   Procedure: LOOP ELECTROSURGICAL EXCISION PROCEDURE (LEEP);  Surgeon: Arvella Nigh, MD;  [facility];  Service: Gynecology;  Laterality: N/A;   MASS EXCISION Right 06/24/2021   Procedure: EXCISION RIGHT POSTERIOR NECK MASS;  Surgeon: Kinsinger, [name], MD;  Location: [facility];  Service: General;  Laterality: Right;   ROTATOR CUFF REPAIR Bilateral 2012, 2015   TONSILLECTOMY     TOTAL KNEE ARTHROPLASTY Left 04/08/2019   Procedure: TOTAL KNEE ARTHROPLASTY;  Surgeon: Elsie Saas, MD;  Location: [facility];  Service: Orthopedics;  Laterality: Left;   UPPER GI ENDOSCOPY  05/2018   Social History   Social History Narrative   1 caffeine drink daily    Right-handed   Lives at home with husband.   family history includes Bone cancer in her mother; Breast cancer in her mother; Diabetes Mellitus II in her brother, father, and sister; Heart failure in her father; Hypertension in her brother and father; Obesity in her sister; Other in her sister.   Review of Systems See HPI  Objective:   Physical Exam BP (!) 142/58    Pulse 76    Ht 5' 4.5" (1.638 m)    Wt 220 lb  (99.8 kg)    SpO2 97%    BMI 37.18 kg/m  Obese white woman no acute distress Anicteric Lungs are clear Heart sounds distant S1-S2 no rubs murmurs or gallops The abdomen  is very obese there is a small reducible umbilical hernia.  I cannot detect any hepatosplenomegaly though the abdomen is very large.  There might be slightly prominent abdominal wall veins. She does not have any skin stigmata of chronic liver disease like telangiectasia or spiders. She is alert and oriented x3 and there is no asterixis.  No peripheral edema.

## 2021-10-11 NOTE — Patient Instructions (Signed)
If you are age 71 or older, your body mass index should be between 23-30. Your Body mass index is 37.18 kg/m. If this is out of the aforementioned range listed, please consider follow up with your Primary Care Provider.  ________________________________________________________  The [facility] providers would like to encourage you to use [facility] to communicate with providers for non-urgent requests or questions.  Due to long hold times on the telephone, sending your provider a message by [facility] may be a faster and more efficient way to get a response.  Please allow 48 business hours for a response.  Please remember that this is for non-urgent requests.  _______________________________________________________ Due to recent changes in healthcare laws, you may see the results of your imaging and laboratory studies on MyChart before your provider has had a chance to review them.  We understand that in some cases there may be results that are confusing or concerning to you. Not all laboratory results come back in the same time frame and the provider may be waiting for multiple results in order to interpret others.  Please give [place] 48 hours in order for your provider to thoroughly review all the results before contacting the office for clarification of your results.    Your provider has requested that you go to the basement level for lab work before leaving today. Press "B" on the elevator. The lab is located at the first door on the left as you exit the elevator.  Thank you for choosing me and [facility].  Silvano Rusk, M.D.

## 2021-10-13 LAB — SPECIMEN STATUS REPORT

## 2021-10-13 LAB — AFP TUMOR MARKER: AFP, Serum, Tumor Marker: 3.5 ng/mL (ref 0.0–9.2)

## 2021-10-18 ENCOUNTER — Ambulatory Visit: Payer: HMO | Admitting: Student

## 2021-10-18 ENCOUNTER — Encounter: Payer: Self-pay | Admitting: Student

## 2021-10-18 ENCOUNTER — Ambulatory Visit: Payer: Self-pay | Admitting: Physical Therapy

## 2021-10-18 ENCOUNTER — Other Ambulatory Visit: Payer: Self-pay

## 2021-10-18 VITALS — BP 139/47 | HR 76 | Temp 97.9°F | Ht 64.5 in | Wt 226.6 lb

## 2021-10-18 DIAGNOSIS — R0609 Other forms of dyspnea: Secondary | ICD-10-CM | POA: Diagnosis not present

## 2021-10-18 DIAGNOSIS — I6522 Occlusion and stenosis of left carotid artery: Secondary | ICD-10-CM | POA: Diagnosis not present

## 2021-10-18 DIAGNOSIS — R5383 Other fatigue: Secondary | ICD-10-CM

## 2021-10-18 DIAGNOSIS — R000 Tachycardia, unspecified: Secondary | ICD-10-CM | POA: Diagnosis not present

## 2021-10-18 DIAGNOSIS — R002 Palpitations: Secondary | ICD-10-CM

## 2021-10-18 DIAGNOSIS — R072 Precordial pain: Secondary | ICD-10-CM | POA: Diagnosis not present

## 2021-10-18 NOTE — Progress Notes (Signed)
Primary Physician/Referring:  Prince Solian, MD  Patient ID: Janice [name], female    DOB: 02-07-1951, 71 y.o.   MRN: [id number]  Chief Complaint  Patient presents with   Shortness of Breath   Tachycardia   HPI:    Janice [name]  is a 71 y.o. female with hypertension, type 2 DM, moderate AI, carotid artery stenosis, liver cirrhosis, chronic lymphocytic lymphoma. History of significant anemia, which lead to discontinuation of aspirin. Patient reported intolerance to high dose Crestor in the past.   Patient was last seen in our office 07/06/2021 with complaints of decreased exercise tolerance.  At that time she was advised to undergo nuclear stress testing.  Unfortunately when stress test was scheduled patient had a fall and therefore canceled it and never rescheduled, therefore stress test has not been done.  She now presents for follow-up with similar symptoms including chest discomfort which she describes as "nervous feeling" as well as palpitations and shortness of breath.  Patient's symptoms have been ongoing for the last few months, however they have gotten worse in the last several weeks.  Denies syncope, near syncope, orthopnea, PND, leg edema.  Past Medical History:  Diagnosis Date   Allergy    Asthma    seasonal   Cellulitis 2013   Left toe   Cirrhosis ([place])    Common migraine    History of   Complication of anesthesia    Per pt/had breathing problems with "block" during rotator cuff surgery. 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[place])    Past Surgical History:  Procedure Laterality Date   ANKLE SURGERY     Left    COLONOSCOPY  05/2018   LEEP N/A 09/14/2018   Procedure: LOOP ELECTROSURGICAL EXCISION PROCEDURE (LEEP);  Surgeon: Arvella Nigh, MD;  Location: [facility];  Service: Gynecology;  Laterality: N/A;   MASS EXCISION Right 06/24/2021   Procedure: EXCISION RIGHT POSTERIOR NECK MASS;  Surgeon: Kinsinger, [name], MD;  Location: [facility];  Service: General;  Laterality: Right;   ROTATOR CUFF REPAIR Bilateral 2012, 2015   TONSILLECTOMY     TOTAL KNEE ARTHROPLASTY Left 04/08/2019   Procedure: TOTAL KNEE ARTHROPLASTY;  Surgeon: Elsie Saas, MD;  Location: [facility];  Service: Orthopedics;  Laterality: Left;   UPPER GI ENDOSCOPY  05/2018   Family History  Problem Relation Age of Onset   Breast cancer Mother    Bone cancer Mother    Heart failure Father    Diabetes Mellitus II Father    Hypertension Father    Diabetes Mellitus II Sister    Obesity Sister    Other Sister        retina problem   Hypertension Brother  Diabetes Mellitus II Brother    Colon cancer Neg Hx    Rectal cancer Neg Hx    Stomach cancer Neg Hx    Esophageal cancer Neg Hx    Pancreatic cancer Neg Hx    Liver disease Neg Hx     Social History   Tobacco Use   Smoking status: Never   Smokeless tobacco: Never  Substance Use Topics   Alcohol use: Not Currently    Comment: very rare   Marital Status: Widowed   ROS  Review of Systems  Cardiovascular:  Positive for chest pain, dyspnea on exertion and palpitations. Negative for claudication, leg swelling, near-syncope, orthopnea, paroxysmal nocturnal dyspnea and syncope.       Decreased activity tolerance   Respiratory:  Positive for shortness of breath.   Neurological:  Negative for dizziness.   Objective   Blood pressure (!) 139/47, pulse 76, temperature 97.9 F (36.6 C), temperature source Temporal, height 5' 4.5" (1.638 m), weight 226 lb 9.6 oz (102.8 kg), SpO2 96 %.  Vitals with BMI 10/18/2021 10/18/2021 10/11/2021  Height - 5' 4.5" 5' 4.5"  Weight - 226 lbs 10 oz 220 lbs  BMI - 44.03 47.42  Systolic 595 638 756  Diastolic 47 39 58  Pulse 76 75 76      Physical Exam Vitals reviewed.  Constitutional:      Appearance: She is obese.  Cardiovascular:     Rate and Rhythm: Normal rate and regular rhythm.     Pulses: Intact distal pulses.          Carotid pulses are  on the left side with bruit.    Heart sounds: S1 normal and S2 normal. No murmur heard.   No gallop.  Pulmonary:     Effort: Pulmonary effort is normal. No respiratory distress.     Breath sounds: No wheezing, rhonchi or rales.  Musculoskeletal:     Right lower leg: No edema.     Left lower leg: No edema.  Neurological:     Mental Status: She is alert.    Laboratory examination:   Recent Labs    05/18/21 1431 06/07/21 1149 07/06/21 1424 08/24/21 0940  NA 139 138 138 137  K 4.5 4.9 4.9 4.6  CL 106 105 101 105  CO2 $Re'24 27 23 24  'Hnw$ GLUCOSE 324* 141* 210* 229*  BUN $Re'13 15 14 14  'ass$ CREATININE 1.01* 0.67 0.75 0.82  CALCIUM 8.9 9.1 9.4 8.7*  GFRNONAA 60* >60  --  >60   CrCl cannot be calculated (Patient's most recent lab result is older than the maximum 21 days allowed.).  CMP Latest Ref Rng & Units 08/24/2021 07/06/2021 06/07/2021  Glucose 70 - 99 mg/dL 229(H) 210(H) 141(H)  BUN 8 - 23 mg/dL $Remove'14 14 15  'bweDeac$ Creatinine 0.44 - 1.00 mg/dL 0.82 0.75 0.67  Sodium 135 - 145 mmol/L 137 138 138  Potassium 3.5 - 5.1 mmol/L 4.6 4.9 4.9  Chloride 98 - 111 mmol/L 105 101 105  CO2 22 - 32 mmol/L $RemoveB'24 23 27  'oHPMKAsO$ Calcium 8.9 - 10.3 mg/dL 8.7(L) 9.4 9.1  Total Protein 6.5 - 8.1 g/dL 6.2(L) - 6.7  Total Bilirubin 0.3 - 1.2 mg/dL 0.5 - 0.9  Alkaline Phos 38 - 126 U/L 82 - 67  AST 15 - 41 U/L 14(L) - 22  ALT 0 - 44 U/L 19 - 25   CBC Latest  Ref Rng & Units 08/24/2021 06/07/2021 05/18/2021  WBC 4.0 - 10.5 K/uL 19.5(H) 17.5(H) 12.5(H)  Hemoglobin 12.0 - 15.0 g/dL 10.4(L) 11.9(L) 10.6(L)  Hematocrit 36.0 - 46.0 % 33.2(L) 38.7 33.0(L)  Platelets 150 - 400 K/uL 96(L) 105(L) 87(L)    Lipid Panel No results for input(s): CHOL, TRIG, LDLCALC, VLDL, HDL, CHOLHDL, LDLDIRECT in the last 8760 hours.  HEMOGLOBIN A1C Lab Results  Component Value Date   HGBA1C 7.5 (H) 06/07/2021   MPG 168.55 06/07/2021   TSH No results for input(s): TSH in the last 8760 hours.  External labs:  05/28/2021: Glucose 141, creatinine 0.8, GFR >60, sodium 135, potassium 5.1, BUN 12 Hgb 11.8, HCT 36.8 Total cholesterol 113, triglycerides 70, HDL 35, LDL 64 Lipoprotein B 60 (normal) TSH 1.52 A1c 7.6%  05/13/2020: Glucose 71, BUN 11, creatinine 0.7, GFR >60, sodium 142, potassium 4.6, AST 22, ALT 34, alk phos 90 Hemoglobin 12.4, hematocrit 38.4, MCV 84.1, platelet 83 Total cholesterol 120, triglycerides 86, HDL 36, LDL 67, lipoprotein B 59 TSH 2.03 A1c 6.2%  12/31/2019: Glucose BUN/Cr 10/0.8. eGFR 71 HbA1C 6.7%   09/2018: Chol 151, TG 164, HDL 33, LDL 85 TSH 1.9 normal  Allergies   Allergies  Allergen Reactions   Benzonatate Anaphylaxis and Hives   Fluticasone Furoate-Vilanterol Anaphylaxis and Hives    Other reaction(s): hives  breo ellipta   Iodine Hives, Swelling and Other (See Comments)    And Betadine/caused hives and swelling   Latex Hives, Itching, Rash and Other (See Comments)    Burning   Simvastatin Other (See Comments)    Caused neuropathy in arms Neuropathy in arms   Bupropion Other (See Comments)    Negative thoughts. Crazy thoughts   Levofloxacin Other (See Comments)    Joint aches//leg, shoulder pain Joint pain   Penicillins Hives   Povidone-Iodine Hives   Amoxicillin Hives    Blisters Did it involve swelling of the face/tongue/throat, SOB, or low BP? Yes Did it involve sudden or severe rash/hives, skin peeling, or  any reaction on the inside of your mouth or nose? Yes Did you need to seek medical attention at a hospital or doctor's office?Unknown When did it last happen? 2019      If all above answers are NO, may proceed with cephalosporin use.    Chlorthalidone Other (See Comments)    Ears ringing    Diflucan [Fluconazole] Hives and Other (See Comments)    blister   Povidone Iodine    Amoxicillin-Pot Clavulanate Rash   Doxycycline Itching and Diarrhea    Medications Prior to Visit:   Outpatient Medications Prior to Visit  Medication Sig Dispense Refill   albuterol (PROVENTIL HFA;VENTOLIN HFA) 108 (90 Base) MCG/ACT inhaler Inhale 1-2 puffs into the lungs every 6 (six) hours as needed (Cough).     amLODipine (NORVASC) 10 MG tablet Take 10 mg by mouth daily.     azelastine (ASTELIN) 0.1 % nasal spray Place 1 spray into both nostrils 2 (two) times daily. Use in each nostril as directed 90 mL 3   Calcium-Magnesium-Zinc (CAL-MAG-ZINC PO) Take 2 tablets by mouth daily.     Carboxymethylcellul-Glycerin (REFRESH OPTIVE PF OP) Place 1 drop into both eyes daily as needed (dry eyes). Non-pres     Coenzyme Q10 (CO Q-10) 100 MG CAPS Take 100 mg by mouth daily.     desoximetasone (TOPICORT) 0.25 % cream Apply 1 application topically 4 (four) times daily as needed (rash).     diclofenac sodium (VOLTAREN) 1 % GEL Apply 2 g topically 2 (two) times daily as needed (knee pain/ foot /Triger finger).  2   fluticasone (FLONASE) 50 MCG/ACT nasal spray Place 1 spray into both nostrils daily.     gabapentin (NEURONTIN) 300 MG capsule 600 mg daily.     hydrALAZINE (APRESOLINE) 25 MG tablet Take 1 tablet (25 mg total) by mouth 2 (two) times daily. 60 tablet 3   insulin NPH Human (HUMULIN N,NOVOLIN N) 100 UNIT/ML injection Inject 40-75 Units into the skin See admin instructions. Inject 45 units subcutaneously at breakfast, 30 units at lunch & 45 units at supper     insulin regular (NOVOLIN R,HUMULIN R) 100 units/mL  injection Inject 30 Units into the skin 3 (three) times daily before meals.     irbesartan (AVAPRO) 300 MG tablet Take 300 mg by mouth daily.     metFORMIN (GLUCOPHAGE-XR) 500 MG 24 hr tablet Take 1,000 mg by mouth 2 (two) times daily.     omeprazole (PRILOSEC) 40 MG capsule Take 40 mg by mouth 2 (two) times daily.     PARoxetine (PAXIL) 10 MG tablet Take 10 mg by mouth daily.     rosuvastatin (CRESTOR) 10 MG tablet Take 1 tablet (10 mg total) by mouth daily. (Patient taking differently: Take 10 mg by mouth daily with supper.) 90 tablet 3   spironolactone (ALDACTONE) 50 MG tablet Take 1 tablet by mouth once daily 90 tablet 3   terconazole (TERAZOL 3) 0.8 % vaginal cream Place 1 applicator vaginally as needed (yeast infections).      Wheat Dextrin (BENEFIBER PO) Take by mouth. Take 3/4 tsp daily     aspirin EC 81 MG tablet Take 1 tablet (81 mg total) by mouth daily. Swallow whole. (Patient not taking: Reported on 10/18/2021) 90 tablet 3   vitamin C (ASCORBIC ACID) 500 MG tablet Take 500 mg by mouth 2 (two) times a day.  (Patient not taking: Reported on 10/18/2021)     No facility-administered medications prior to visit.   Final Medications at End of Visit    Current Meds  Medication Sig   albuterol (PROVENTIL HFA;VENTOLIN HFA) 108 (90 Base) MCG/ACT inhaler Inhale 1-2 puffs into the lungs every 6 (six) hours as needed (Cough).   amLODipine (NORVASC) 10 MG tablet Take 10 mg by mouth daily.   azelastine (ASTELIN) 0.1 % nasal spray Place 1 spray into both nostrils 2 (two) times daily. Use in each nostril as directed   Calcium-Magnesium-Zinc (CAL-MAG-ZINC PO) Take 2 tablets by mouth daily.   Carboxymethylcellul-Glycerin (REFRESH OPTIVE PF OP) Place 1 drop into both eyes daily as needed (dry eyes). Non-pres   Coenzyme Q10 (CO Q-10) 100 MG CAPS Take 100 mg by mouth daily.   desoximetasone (TOPICORT) 0.25 % cream Apply 1 application topically 4 (four) times daily as needed (rash).   diclofenac sodium  (VOLTAREN) 1 % GEL Apply 2 g topically 2 (two) times daily as needed (knee pain/ foot /Triger finger).   fluticasone (FLONASE) 50 MCG/ACT nasal spray Place 1 spray into both nostrils daily.   gabapentin (NEURONTIN) 300 MG capsule 600 mg daily.   hydrALAZINE (APRESOLINE) 25 MG tablet Take 1 tablet (25 mg total) by mouth 2 (two) times daily.   insulin NPH Human (HUMULIN N,NOVOLIN N) 100 UNIT/ML injection Inject 40-75 Units into the skin See admin instructions. Inject 45 units subcutaneously at breakfast, 30 units at lunch & 45 units at supper   insulin regular (NOVOLIN R,HUMULIN R) 100 units/mL injection Inject 30 Units into the skin 3 (three) times daily before meals.   irbesartan (AVAPRO) 300 MG tablet Take  300 mg by mouth daily.   metFORMIN (GLUCOPHAGE-XR) 500 MG 24 hr tablet Take 1,000 mg by mouth 2 (two) times daily.   omeprazole (PRILOSEC) 40 MG capsule Take 40 mg by mouth 2 (two) times daily.   PARoxetine (PAXIL) 10 MG tablet Take 10 mg by mouth daily.   rosuvastatin (CRESTOR) 10 MG tablet Take 1 tablet (10 mg total) by mouth daily. (Patient taking differently: Take 10 mg by mouth daily with supper.)   spironolactone (ALDACTONE) 50 MG tablet Take 1 tablet by mouth once daily   terconazole (TERAZOL 3) 0.8 % vaginal cream Place 1 applicator vaginally as needed (yeast infections).    Wheat Dextrin (BENEFIBER PO) Take by mouth. Take 3/4 tsp daily   Radiology:   No results found.  Cardiac Studies:   Carotid artery duplex 35/32/9924:  Peak systolic velocities in the right bifurcation, internal, external and  common carotid arteries are within normal limits.  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 Stenosis in the  left external carotid artery (<50%).  Antegrade right vertebral artery flow. Antegrade left vertebral artery  flow.  Compared to 01/29/2020, left ICA stenosis was in the 50-69% range. Follow  up in one year is appropriate if clinically indicated.    Echocardiogram 02/03/2021:  Normal LV systolic function with visual EF 60-65%. Left ventricle cavity  is normal in size. Mild to moderate left ventricular hypertrophy. Normal  global wall motion. Doppler evidence of grade II (pseudonormal) diastolic dysfunction, elevated LAP.  Left atrial cavity is mildly dilated.  Mild (Grade I) aortic regurgitation.  Mild (Grade I) mitral regurgitation.  Mild tricuspid regurgitation. No evidence of pulmonary hypertension.  Compared to study dated 02/27/2019: G1DD is now G2DD, elevated LAP is new, moderate AR is now mild, otherwise no significant change.   [place] Stress Test 02/18/2019: Stress EKG is non-diagnostic, as this is pharmacological stress test. Normal myocardial perfusion. Stress LV EF: 50%. Low risk stress test.  EKG:  10/18/2021: Sinus rhythm at a rate of 69 bpm.  Normal axis.  Poor progression, cannot exclude intraseptal infarct old.  No evidence of ischemia or underlying injury pattern.  Compared EKG 03/08/2021, no significant change.  EKG 03/09/2020: Sinus rhythm 76 bpm Normal EKG  Assessment     ICD-10-CM   1. Dyspnea on exertion  R06.09 EKG 12-Lead    Brain natriuretic peptide    Magnesium    PCV ECHOCARDIOGRAM COMPLETE    PCV MYOCARDIAL PERFUSION WITH LEXISCAN    Basic metabolic panel    CBC    2. Tachycardia  R00.0 EKG 12-Lead    3. Precordial pain  R07.2 Magnesium    PCV ECHOCARDIOGRAM COMPLETE    PCV MYOCARDIAL PERFUSION WITH LEXISCAN    Basic metabolic panel    CBC    4. Other fatigue  R53.83 Brain natriuretic peptide    TSH    Basic metabolic panel    CBC    5. Palpitations  R00.2 TSH    Magnesium    Basic metabolic panel    CBC    6. Asymptomatic stenosis of left carotid artery  I65.22 Lipid Panel With LDL/HDL Ratio       There are no discontinued medications.   No orders of the defined types were placed in this encounter.   Recommendations:   Janice [name] is a 71 y.o. female with  hypertension, type 2 DM, moderate AI, carotid artery stenosis. History of significant anemia, which lead to discontinuation of aspirin. Patient reported  intolerance to high dose Crestor in the past.   Patient was unfortunately lost to follow-up after last office visit in October 2022.  She now presents with complaints of shortness of breath, palpitations, and chest discomfort.  Given multiple cardiovascular risk factors recommend further ischemic evaluation.  We will therefore obtain Lexiscan nuclear stress test as she has not a treadmill candidate given significant dyspnea.  We will also obtain repeat echocardiogram.  Have placed lab orders including CBC, BMP, BNP, TSH, magnesium, and lipid profile testing.   Patient had discontinued aspirin with concerns that it was contributing to palpitations.  I have advised her to resume aspirin 81 mg daily.  BMP is fairly well controlled, will not make changes to medications at this time.  Physical exam and EKG unchanged compared to previous.  Follow-up in 4 weeks, sooner if needed, for results of cardiac testing.   Janice Berthold, PA-C 10/18/2021, 1:03 PM Office: [phone]

## 2021-10-19 LAB — CBC
Hematocrit: 33 % — ABNORMAL LOW (ref 34.0–46.6)
Hemoglobin: 10.3 g/dL — ABNORMAL LOW (ref 11.1–15.9)
MCH: 26.1 pg — ABNORMAL LOW (ref 26.6–33.0)
MCHC: 31.2 g/dL — ABNORMAL LOW (ref 31.5–35.7)
MCV: 84 fL (ref 79–97)
Platelets: 100 10*3/uL — CL (ref 150–450)
RBC: 3.94 x10E6/uL (ref 3.77–5.28)
RDW: 15.1 % (ref 11.7–15.4)
WBC: 18.4 10*3/uL — ABNORMAL HIGH (ref 3.4–10.8)

## 2021-10-19 LAB — LIPID PANEL WITH LDL/HDL RATIO
Cholesterol, Total: 125 mg/dL (ref 100–199)
HDL: 43 mg/dL
LDL Chol Calc (NIH): 65 mg/dL (ref 0–99)
LDL/HDL Ratio: 1.5 ratio (ref 0.0–3.2)
Triglycerides: 86 mg/dL (ref 0–149)
VLDL Cholesterol Cal: 17 mg/dL (ref 5–40)

## 2021-10-19 LAB — BRAIN NATRIURETIC PEPTIDE: BNP: 30.9 pg/mL (ref 0.0–100.0)

## 2021-10-19 LAB — TSH: TSH: 1.4 u[IU]/mL (ref 0.450–4.500)

## 2021-10-19 LAB — BASIC METABOLIC PANEL
BUN/Creatinine Ratio: 14 (ref 12–28)
BUN: 11 mg/dL (ref 8–27)
CO2: 24 mmol/L (ref 20–29)
Calcium: 9.1 mg/dL (ref 8.7–10.3)
Chloride: 102 mmol/L (ref 96–106)
Creatinine, Ser: 0.77 mg/dL (ref 0.57–1.00)
Glucose: 150 mg/dL — ABNORMAL HIGH (ref 70–99)
Potassium: 4.9 mmol/L (ref 3.5–5.2)
Sodium: 137 mmol/L (ref 134–144)
eGFR: 83 mL/min/{1.73_m2} (ref >59)

## 2021-10-19 LAB — MAGNESIUM: Magnesium: 2.3 mg/dL (ref 1.6–2.3)

## 2021-10-20 ENCOUNTER — Telehealth: Payer: Self-pay

## 2021-10-20 NOTE — Telephone Encounter (Signed)
Patient was advised to go to the ED given neurologic symptoms and worsening chest pain, patient was agreeable.

## 2021-10-21 ENCOUNTER — Ambulatory Visit: Payer: HMO | Attending: Physician Assistant | Admitting: Physical Therapy

## 2021-10-21 ENCOUNTER — Encounter: Payer: Self-pay | Admitting: Physical Therapy

## 2021-10-21 ENCOUNTER — Other Ambulatory Visit: Payer: Self-pay

## 2021-10-21 DIAGNOSIS — M25571 Pain in right ankle and joints of right foot: Secondary | ICD-10-CM | POA: Diagnosis not present

## 2021-10-21 DIAGNOSIS — R262 Difficulty in walking, not elsewhere classified: Secondary | ICD-10-CM | POA: Diagnosis not present

## 2021-10-21 DIAGNOSIS — M6281 Muscle weakness (generalized): Secondary | ICD-10-CM | POA: Diagnosis not present

## 2021-10-21 DIAGNOSIS — R600 Localized edema: Secondary | ICD-10-CM | POA: Diagnosis not present

## 2021-10-21 NOTE — Patient Instructions (Signed)
Access Code: [email or web address] URL: [email or web address] Date: 10/21/2021 Prepared by: Ethel Rana  Exercises Seated Ankle Pumps - 2-3 x daily - 7 x weekly - 1 sets - 10 reps Heel Toe Raises with Counter Support - 1 x daily - 7 x weekly - 1 sets - 10 reps Standing Ankle Dorsiflexion Stretch - 1 x daily - 7 x weekly - 3 sets - 20 hold Gastroc Stretch on Wall - 1 x daily - 7 x weekly - 3 sets - 20 hold Seated Heel Toe Raises - 1 x daily - 7 x weekly - 2 sets - 10 reps Seated Ankle Circles - 2 x daily - 7 x weekly - 3 sets - 10 reps

## 2021-10-22 NOTE — Therapy (Signed)
[address] [place], [place], [address] Phone: [phone]   Fax:  [phone]  Physical Therapy Evaluation  Patient Details  Name: Janice [name] MRN: [id number] Date of Birth: 04/17/1951 Referring Provider (PT): Murphy-Wainer   Encounter Date: 10/21/2021   PT End of Session - 10/22/21 0748     Visit Number 1    Number of Visits 16    Date for PT Re-Evaluation 12/17/21    PT Start Time 1401    PT Stop Time 1444    PT Time Calculation (min) 43 min    Activity Tolerance Patient tolerated treatment well;Patient limited by pain    Behavior During Therapy [facility] for tasks assessed/performed             Past Medical History:  Diagnosis Date   Allergy    Asthma    seasonal   Cellulitis 2013   Left toe   Cirrhosis ([place])    Common migraine    History of   Complication of anesthesia    Per pt/had breathing problems with "block" during rotator cuff surgery. 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[place])     Past Surgical History:  Procedure Laterality Date   ANKLE SURGERY     Left    COLONOSCOPY  05/2018   LEEP N/A 09/14/2018   Procedure: LOOP ELECTROSURGICAL EXCISION PROCEDURE (LEEP);  Surgeon: Arvella Nigh, MD;  Location: [facility];  Service: Gynecology;  Laterality: N/A;   MASS EXCISION Right 06/24/2021    Procedure: EXCISION RIGHT POSTERIOR NECK MASS;  Surgeon: Kinsinger, [name], MD;  Location: [facility];  Service: General;  Laterality: Right;   ROTATOR CUFF REPAIR Bilateral 2012, 2015   TONSILLECTOMY     TOTAL KNEE ARTHROPLASTY Left 04/08/2019   Procedure: TOTAL KNEE ARTHROPLASTY;  Surgeon: Elsie Saas, MD;  Location: [facility];  Service: Orthopedics;  Laterality: Left;   UPPER GI ENDOSCOPY  05/2018    There were no vitals filed for this visit.    Subjective Assessment - 10/21/21 1402     Subjective Patient reports that she was very sedentary for about a month due to illness. She tried to walk after that, but noted a severe pain in her R calf. [place] rulled out blood clot.    Pertinent History type 2 DM, moderate AI, carotid artery stenosis, liver cirrhosis, chronic lymphocytic lymphoma    Limitations Walking;House hold activities    How long can you walk comfortably? Less than 5 minutes.    Diagnostic tests Doppler US (-) per patient    Patient Stated Goals Get back to stationary bike and walk, would like to get back to swim, get back to her old exercise routine.  Currently in Pain? Yes    Pain Score 2     Pain Location Calf    Pain Orientation Right;Posterior;Distal;Medial    Pain Descriptors / Indicators Tightness;Sore    Pain Type Acute pain    Pain Onset More than a month ago    Pain Frequency Intermittent    Aggravating Factors  Walking    Pain Relieving Factors Rest, elevation    Effect of Pain on Daily Activities Unable to walk or exercise.                [facility] PT Assessment - 10/21/21 0001       Assessment   Medical Diagnosis calf injury    Referring Provider (PT) Murphy-Wainer      Balance Screen   Has the patient fallen in the past 6 months Yes    How many times? 1    Has the patient had a decrease in activity level because of a fear of falling?  No    Is the patient reluctant to leave their home because of a fear of falling?  No      Home Environment    Living Environment Private residence    Living Arrangements Other relatives    Available Help at Discharge Family    Additional Comments Moving to an apartment in just over a week, which has 1 step, uses wall for support.      Prior Function   Level of Independence Independent      ROM / Strength   AROM / PROM / Strength AROM;Strength      AROM   Overall AROM Comments WFL-R ankle eversion minimally impaired      Strength   Overall Strength Comments BLE-R hip flex and IR 4-/5, eversion 3+/5, PF limited due to pain.      Palpation   Palpation comment R lower soleus and achilles tendon severely TTP. Tightness and trigger points throughout lower calf.                        Objective measurements completed on examination: See above findings.                PT Education - 10/21/21 1434     Education Details HEP, POC    Person(s) Educated Patient    Methods Explanation;Demonstration;Handout    Comprehension Verbalized understanding;Returned demonstration              PT Short Term Goals - 10/21/21 1405       PT SHORT TERM GOAL #1   Title I with HEP    Time 4    Period Weeks    Status New    Target Date 11/18/21               PT Long Term Goals - 10/21/21 1405       PT LONG TERM GOAL #1   Title I with final HEP    Time 8    Period Weeks    Status New    Target Date 12/16/21      PT LONG TERM GOAL #2   Title Patient will demosntrate RLE strength of at least 4/5 throughout all joints and all planes of movement.    Baseline R hip and ankle with weakness-3+/5    Time 8    Period Weeks    Status New    Target Date 12/17/21      PT LONG TERM GOAL #3  Title Patient will perform 20 single limb calf raises on R with pain</=3/10    Baseline 0/10    Time 8    Period Weeks    Status New    Target Date 12/17/21      PT LONG TERM GOAL #4   Title Patient will be able to walk for at least 15 minutes with R LE pain</= 3/10     Baseline < 5 minutes    Time 8    Period Weeks    Status New    Target Date 12/17/21                    Plan - 10/21/21 1443     Clinical Impression Statement Patient presents with pain and weakness in R calf, appears to have sustained a strain of her Soleus, whihc has healed, but is not inflammed and tight, with achilles tendonitis. She will benefit from PT for STM, stretch and strengthening as well as education on how to return to her gym routine.    Personal Factors and Comorbidities Age;Fitness;Past/Current Experience    Examination-Activity Limitations Locomotion Level;Transfers;Squat;Stairs;Stand;Lift    Examination-Participation Restrictions Community Activity    Stability/Clinical Decision Making Stable/Uncomplicated    Clinical Decision Making Low    Rehab Potential Good    PT Frequency 1x / week    PT Duration 8 weeks    PT Treatment/Interventions ADLs/Self Care Home Management;Moist Heat;Iontophoresis 4mg /ml Dexamethasone;Ultrasound;Cryotherapy;Electrical Stimulation;Manual techniques;Balance training;Therapeutic exercise;Therapeutic activities;Functional mobility training;Stair training;Gait training;Patient/family education;Dry needling;Passive range of motion    PT Next Visit Plan Assess tolerance to HEP and update.    PT Home Exercise Plan [id number]    Consulted and Agree with Plan of Care Patient             Patient will benefit from skilled therapeutic intervention in order to improve the following deficits and impairments:  Abnormal gait, Decreased range of motion, Difficulty walking, Increased fascial restricitons, Pain, Decreased mobility, Decreased strength, Postural dysfunction, Increased edema, Improper body mechanics, Impaired flexibility  Visit Diagnosis: Pain in right ankle and joints of right foot  Localized edema  Muscle weakness (generalized)  Difficulty in walking, not elsewhere classified     Problem List Patient Active Problem List    Diagnosis Date Noted   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[place]) 05/06/2021   Upper airway cough syndrome 04/29/2021   Cough variant asthma 04/29/2021   Liver cirrhosis secondary to NASH (nonalcoholic steatohepatitis) ([place]) 08/21/2020   Abdominal obesity and metabolic syndrome 45/11/8880   Class 2 severe obesity with serious comorbidity and body mass index (BMI) of 37.0 to 37.9 in adult ([place]) 08/21/2020   Thrombocytopenia ([place]) 08/21/2020   Moderate aortic regurgitation 03/09/2020   Asymptomatic stenosis of left carotid artery 03/09/2020   Primary localized osteoarthritis of left knee 03/27/2019   DDD (degenerative disc disease), lumbar    Obstructive sleep apnea treated with continuous positive airway pressure (CPAP) 08/27/2018   Asthma with acute exacerbation 12/07/2013   Diverticulitis 04/23/2012   Essential hypertension 04/23/2012   Controlled type 2 diabetes mellitus without complication, with long-term current use of insulin ([place]) 04/23/2012   Colon polyps 04/23/2012    Marcelina Morel, DPT 10/22/2021, 7:53 AM  [address] [place], [place], [address] Phone: [phone]   Fax:  [phone]  Name: Janice [name] MRN: [id number] Date of Birth: 1951-02-05

## 2021-10-27 ENCOUNTER — Telehealth: Payer: Self-pay | Admitting: Student

## 2021-10-27 ENCOUNTER — Encounter: Payer: HMO | Admitting: Physical Therapy

## 2021-10-27 NOTE — Telephone Encounter (Signed)
Patient asking for results from her blood work. She voiced consent to leave detailed message on voicemail regarding results. She says she might not be able to answer the call later, so this is ok.

## 2021-10-27 NOTE — Telephone Encounter (Signed)
Called pt, no answer. Left detailed message regarding pts lab results on pts VM.

## 2021-11-04 ENCOUNTER — Encounter: Payer: Self-pay | Admitting: Physical Therapy

## 2021-11-04 ENCOUNTER — Other Ambulatory Visit: Payer: Self-pay

## 2021-11-04 ENCOUNTER — Ambulatory Visit: Payer: HMO | Admitting: Physical Therapy

## 2021-11-04 DIAGNOSIS — M25571 Pain in right ankle and joints of right foot: Secondary | ICD-10-CM

## 2021-11-04 DIAGNOSIS — R262 Difficulty in walking, not elsewhere classified: Secondary | ICD-10-CM

## 2021-11-04 DIAGNOSIS — M6281 Muscle weakness (generalized): Secondary | ICD-10-CM

## 2021-11-04 DIAGNOSIS — R600 Localized edema: Secondary | ICD-10-CM

## 2021-11-04 NOTE — Therapy (Signed)
Janice [name]. [place], [place], [address] Phone: [phone]   Fax:  [phone]  Physical Therapy Treatment  Patient Details  Name: Janice [name] MRN: [id number] Date of Birth: 02/25/1951 Referring Provider (PT): Janice [name]   Encounter Date: 11/04/2021   PT End of Session - 11/04/21 1745     Visit Number 2    Number of Visits 16    Date for PT Re-Evaluation 12/17/21    PT Start Time 1702    PT Stop Time 1740    PT Time Calculation (min) 38 min    Activity Tolerance Patient tolerated treatment well    Behavior During Therapy Janice [name] for tasks assessed/performed             Past Medical History:  Diagnosis Date   Allergy    Asthma    seasonal   Cellulitis 2013   Left toe   Cirrhosis ([place])    Common migraine    History of   Complication of anesthesia    Per pt/had breathing problems with "block" during rotator cuff surgery. 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[place])     Past Surgical History:  Procedure Laterality Date   ANKLE SURGERY     Left    COLONOSCOPY  05/2018   LEEP N/A 09/14/2018   Procedure: LOOP ELECTROSURGICAL EXCISION PROCEDURE (LEEP);  Surgeon: Janice Nigh, MD;  [facility];  Service: Gynecology;  Laterality: N/A;   MASS EXCISION Right 06/24/2021   Procedure: EXCISION  RIGHT POSTERIOR NECK MASS;  Surgeon: Janice [name], Janice Bruce, MD;  Location: [facility];  Service: General;  Laterality: Right;   ROTATOR CUFF REPAIR Bilateral 2012, 2015   TONSILLECTOMY     TOTAL KNEE ARTHROPLASTY Left 04/08/2019   Procedure: TOTAL KNEE ARTHROPLASTY;  Surgeon: Janice Saas, MD;  Location: [facility];  Service: Orthopedics;  Laterality: Left;   UPPER GI ENDOSCOPY  05/2018    There were no vitals filed for this visit.   Subjective Assessment - 11/04/21 1706     Subjective I'm doing OK, I feel like the HEP is OK but I have some questions about them. I've been getting better about doing them consistently. I'm not sure I'm doing them right.    Patient Stated Goals Get back to stationary bike and walk, would like to get back to swim, get back to her old exercise routine.    Currently in Pain? Yes    Pain Score 3     Pain Location Calf    Pain Orientation Right;Posterior    Pain Descriptors / Indicators Tightness;Sore    Pain Type Acute pain  [facility] PT Treatment/Exercise - 11/04/21 0001       Exercises   Exercises Ankle;Knee/Hip      Knee/Hip Exercises: Aerobic   Nustep nustep L3 x5 minutes BLEs only      Manual Therapy   Manual Therapy Soft tissue mobilization;Other (comment)    Soft tissue mobilization IASTM R calf in sidelying    Other Manual Therapy ice massage distal R achilles      Ankle Exercises: Standing   Rocker Board 3 minutes    Rocker Board Limitations AP only                     PT Education - 11/04/21 1744     Education Details revamped HEP to exercises she can perform with more accurate form, gastroc/soleus anatomy and how tight mm groups puts strain on achilles tendon, benfeits of heat to proximal mm group and ice to distal tendon    Person(s) Educated Patient    Methods Explanation;Handout;Demonstration    Comprehension Verbalized understanding;Returned demonstration               PT Short Term Goals - 10/21/21 1405       PT SHORT TERM GOAL #1   Title I with HEP    Time 4    Period Weeks    Status New    Target Date 11/18/21               PT Long Term Goals - 10/21/21 1405       PT LONG TERM GOAL #1   Title I with final HEP    Time 8    Period Weeks    Status New    Target Date 12/16/21      PT LONG TERM GOAL #2   Title Patient will demosntrate RLE strength of at least 4/5 throughout all joints and all planes of movement.    Baseline R hip and ankle with weakness-3+/5    Time 8    Period Weeks    Status New    Target Date 12/17/21      PT LONG TERM GOAL #3   Title Patient will perform 20 single limb calf raises on R with pain</=3/10    Baseline 0/10    Time 8    Period Weeks    Status New    Target Date 12/17/21      PT LONG TERM GOAL #4   Title Patient will be able to walk for at least 15 minutes with R LE pain</= 3/10    Baseline < 5 minutes    Time 8    Period Weeks    Status New    Target Date 12/17/21                   Plan - 11/04/21 1745     Clinical Impression Statement Ms. Cosio arrives today doing OK, getting better at keeping up with her HEP but confused about some; she was unable to perform some stretches correctly so I revamped her HEP a bit today. Warmed up on Nustep, then proceeded with STM and ice massage to R calf with significant decrease in pain. She politely declines ionto, did not feel like it helped her. Otherwise spent some time working closed chain mobility exercises for R ankle. Gait pattern much improved at end of session.    Personal Factors and Comorbidities Age;Fitness;Past/Current Experience    Examination-Activity Limitations Locomotion Level;Transfers;Squat;Stairs;Stand;Lift    Examination-Participation Restrictions  Community Activity    Stability/Clinical Decision Making Stable/Uncomplicated    Clinical Decision Making Low    Rehab Potential Good    PT Frequency 1x / week    PT  Duration 8 weeks    PT Treatment/Interventions ADLs/Self Care Home Management;Moist Heat;Iontophoresis 4mg /ml Dexamethasone;Ultrasound;Cryotherapy;Electrical Stimulation;Manual techniques;Balance training;Therapeutic exercise;Therapeutic activities;Functional mobility training;Stair training;Gait training;Patient/family education;Dry needling;Passive range of motion    PT Next Visit Plan has enough of an HEP for now- focus on STM to R calf, also gait training and closed chain challenges. Did not like/does not want more ionto.    PT Home Exercise Plan [id number]             Patient will benefit from skilled therapeutic intervention in order to improve the following deficits and impairments:  Abnormal gait, Decreased range of motion, Difficulty walking, Increased fascial restricitons, Pain, Decreased mobility, Decreased strength, Postural dysfunction, Increased edema, Improper body mechanics, Impaired flexibility  Visit Diagnosis: Pain in right ankle and joints of right foot  Localized edema  Muscle weakness (generalized)  Difficulty in walking, not elsewhere classified     Problem List Patient Active Problem List   Diagnosis Date Noted   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[place]) 05/06/2021   Upper airway cough syndrome 04/29/2021   Cough variant asthma 04/29/2021   Liver cirrhosis secondary to NASH (nonalcoholic steatohepatitis) ([place]) 08/21/2020   Abdominal obesity and metabolic syndrome 71/02/2693   Class 2 severe obesity with serious comorbidity and body mass index (BMI) of 37.0 to 37.9 in adult ([place]) 08/21/2020   Thrombocytopenia ([place]) 08/21/2020   Moderate aortic regurgitation 03/09/2020   Asymptomatic stenosis of left carotid artery 03/09/2020   Primary localized osteoarthritis of left knee 03/27/2019   DDD (degenerative disc disease), lumbar    Obstructive sleep apnea treated with continuous positive airway pressure (CPAP) 08/27/2018   Asthma with acute exacerbation 12/07/2013    Diverticulitis 04/23/2012   Essential hypertension 04/23/2012   Controlled type 2 diabetes mellitus without complication, with long-term current use of insulin ([place]) 04/23/2012   Colon polyps 04/23/2012   Ann Lions PT, DPT, PN2   [facility] [address] [place], [place], [address] Phone: [phone]   Fax:  [phone]  Name: Janice [name] MRN: [id number] Date of Birth: [birth year]

## 2021-11-08 ENCOUNTER — Ambulatory Visit: Payer: HMO

## 2021-11-08 ENCOUNTER — Other Ambulatory Visit: Payer: Self-pay

## 2021-11-08 DIAGNOSIS — R0609 Other forms of dyspnea: Secondary | ICD-10-CM

## 2021-11-08 DIAGNOSIS — R072 Precordial pain: Secondary | ICD-10-CM

## 2021-11-09 ENCOUNTER — Ambulatory Visit
Admission: EM | Admit: 2021-11-09 | Discharge: 2021-11-09 | Disposition: A | Discharge status: Home / Self Care | Payer: HMO | Attending: Internal Medicine | Admitting: Internal Medicine

## 2021-11-09 ENCOUNTER — Ambulatory Visit: Payer: HMO

## 2021-11-09 ENCOUNTER — Encounter: Payer: Self-pay | Admitting: Emergency Medicine

## 2021-11-09 ENCOUNTER — Other Ambulatory Visit: Payer: Self-pay

## 2021-11-09 DIAGNOSIS — J069 Acute upper respiratory infection, unspecified: Secondary | ICD-10-CM

## 2021-11-09 LAB — POCT INFLUENZA A/B
Influenza A, POC: NEGATIVE
Influenza B, POC: NEGATIVE

## 2021-11-09 NOTE — ED Triage Notes (Signed)
Pt here with cough and congestion; pt sts fever last night; pt sts negative covid testing

## 2021-11-09 NOTE — Progress Notes (Signed)
Called and spoke to pt, pt voiced understanding.

## 2021-11-09 NOTE — ED Provider Notes (Signed)
[facility]    CSN: [id number] Arrival date & time: 11/09/21  0820      History   Chief Complaint Chief Complaint  Patient presents with   Cough    HPI Janice [name] is a 71 y.o. female.   Patient here today for evaluation of cough and congestion and fever that started last night.  She took COVID test at home that was negative.  She has not had any nausea or vomiting.  She denies any diarrhea.  She denies sore throat.  She reports that her pulmonologist recommended she be seen quickly after developing upper respiratory symptoms given chronic medical issues.  The history is provided by the patient.  Cough Associated symptoms: fever   Associated symptoms: no chills, no ear pain, no eye discharge, no shortness of breath, no sore throat and no wheezing    Past Medical History:  Diagnosis Date   Allergy    Asthma    seasonal   Cellulitis 2013   Left toe   Cirrhosis (HCC)    Common migraine    History of   Complication of anesthesia    Per pt/had breathing problems with "block" during rotator cuff surgery. 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[place])     Patient Active Problem List   Diagnosis Date Noted   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   Upper airway cough syndrome 04/29/2021   Cough variant asthma 04/29/2021   Liver cirrhosis secondary to NASH  (nonalcoholic steatohepatitis) ([place]) 08/21/2020   Abdominal obesity and metabolic syndrome 95/05/3266   Class 2 severe obesity with serious comorbidity and body mass index (BMI) of 37.0 to 37.9 in adult ([place]) 08/21/2020   Thrombocytopenia ([place]) 08/21/2020   Moderate aortic regurgitation 03/09/2020   Asymptomatic stenosis of left carotid artery 03/09/2020   Primary localized osteoarthritis of left knee 03/27/2019   DDD (degenerative disc disease), lumbar    Obstructive sleep apnea treated with continuous positive airway pressure (CPAP) 08/27/2018   Asthma with acute exacerbation 12/07/2013   Diverticulitis 04/23/2012   Essential hypertension 04/23/2012   Controlled type 2 diabetes mellitus without complication, with long-term current use of insulin ([place]) 04/23/2012   Colon polyps 04/23/2012    Past Surgical History:  Procedure Laterality Date   ANKLE SURGERY     Left    COLONOSCOPY  05/2018   LEEP N/A 09/14/2018   Procedure: LOOP ELECTROSURGICAL EXCISION PROCEDURE (LEEP);  Surgeon: Arvella Nigh, MD;  [facility];  Service: Gynecology;  Laterality: N/A;   MASS EXCISION Right 06/24/2021   Procedure: EXCISION RIGHT POSTERIOR NECK MASS;  Surgeon: Kinsinger, [name], MD;  Location: [facility];  Service: General;  Laterality: Right;   ROTATOR CUFF REPAIR Bilateral 2012, 2015  TONSILLECTOMY     TOTAL KNEE ARTHROPLASTY Left 04/08/2019   Procedure: TOTAL KNEE ARTHROPLASTY;  Surgeon: Elsie Saas, MD;  Location: [facility];  Service: Orthopedics;  Laterality: Left;   UPPER GI ENDOSCOPY  05/2018    OB History   No obstetric history on file.      Home Medications    Prior to Admission medications   Medication Sig Start Date End Date Taking? Authorizing Provider  albuterol (PROVENTIL HFA;VENTOLIN HFA) 108 (90 Base) MCG/ACT inhaler Inhale 1-2 puffs into the lungs every 6 (six) hours as needed (Cough).    [provider]  amLODipine (NORVASC) 10 MG tablet Take  10 mg by mouth daily. 02/16/20   [provider]  aspirin EC 81 MG tablet Take 1 tablet (81 mg total) by mouth daily. Swallow whole. Patient not taking: Reported on 10/18/2021 03/09/20   Nigel Mormon, MD  azelastine (ASTELIN) 0.1 % nasal spray Place 1 spray into both nostrils 2 (two) times daily. Use in each nostril as directed 04/29/21 10/18/21  Tanda Rockers, MD  Calcium-Magnesium-Zinc (CAL-MAG-ZINC PO) Take 2 tablets by mouth daily.    [provider]  Carboxymethylcellul-Glycerin (REFRESH OPTIVE PF OP) Place 1 drop into both eyes daily as needed (dry eyes). Non-pres    [provider]  Coenzyme Q10 (CO Q-10) 100 MG CAPS Take 100 mg by mouth daily.    [provider]  desoximetasone (TOPICORT) 0.25 % cream Apply 1 application topically 4 (four) times daily as needed (rash).    [provider]  diclofenac sodium (VOLTAREN) 1 % GEL Apply 2 g topically 2 (two) times daily as needed (knee pain/ foot /Triger finger). 03/27/18   [provider]  fluticasone (FLONASE) 50 MCG/ACT nasal spray Place 1 spray into both nostrils daily.    [provider]  gabapentin (NEURONTIN) 300 MG capsule 600 mg daily.    [provider]  hydrALAZINE (APRESOLINE) 25 MG tablet Take 1 tablet (25 mg total) by mouth 2 (two) times daily. 07/06/21 11/03/21  Cantwell, Celeste C, PA-C  insulin NPH Human (HUMULIN N,NOVOLIN N) 100 UNIT/ML injection Inject 40-75 Units into the skin See admin instructions. Inject 45 units subcutaneously at breakfast, 30 units at lunch & 45 units at supper    [provider]  insulin regular (NOVOLIN R,HUMULIN R) 100 units/mL injection Inject 30 Units into the skin 3 (three) times daily before meals.    [provider]  irbesartan (AVAPRO) 300 MG tablet Take 300 mg by mouth daily.    [provider]  metFORMIN (GLUCOPHAGE-XR) 500 MG 24 hr tablet Take 1,000 mg by mouth 2 (two) times daily.    [provider]  omeprazole (PRILOSEC) 40 MG capsule Take 40 mg by mouth 2 (two) times daily. 03/11/17   [provider]  PARoxetine (PAXIL) 10 MG tablet Take 10 mg by mouth daily. 06/08/21   [provider]  rosuvastatin (CRESTOR) 10 MG tablet Take 1 tablet (10 mg total) by mouth daily. Patient taking differently: Take 10 mg by mouth daily with supper. 03/08/21   Cantwell, Celeste C, PA-C  spironolactone (ALDACTONE) 50 MG tablet Take 1 tablet by mouth once daily 07/08/21   Levin Erp, PA  terconazole (TERAZOL 3) 0.8 % vaginal cream Place 1 applicator vaginally as needed (yeast infections).     [provider]  vitamin C (ASCORBIC ACID) 500 MG tablet Take 500 mg by mouth 2 (two) times a day.  Patient not taking:  Reported on 10/18/2021    [provider]  Wheat Dextrin (BENEFIBER PO) Take by mouth. Take 3/4 tsp daily    [provider]    Family History Family History  Problem Relation Age of Onset   Breast cancer Mother    Bone cancer Mother    Heart failure Father    Diabetes Mellitus II Father    Hypertension Father    Diabetes Mellitus II Sister    Obesity Sister    Other Sister        retina problem   Hypertension Brother    Diabetes Mellitus II Brother    Colon cancer Neg Hx    Rectal cancer Neg Hx    Stomach cancer Neg Hx    Esophageal cancer Neg Hx    Pancreatic cancer Neg Hx    Liver disease Neg Hx     Social History Social History   Tobacco Use   Smoking status: Never   Smokeless tobacco: Never  Vaping Use   Vaping Use: Never used  Substance Use Topics   Alcohol use: Not Currently    Comment: very rare   Drug use: No     Allergies   Benzonatate, Fluticasone furoate-vilanterol, Iodine, Latex, Simvastatin, Bupropion, Levofloxacin, Penicillins, Povidone-iodine, Amoxicillin, Chlorthalidone, Diflucan [fluconazole], Povidone iodine, Amoxicillin-pot clavulanate, and Doxycycline   Review of Systems Review of  Systems  Constitutional:  Positive for fever. Negative for chills.  HENT:  Positive for congestion. Negative for ear pain and sore throat.   Eyes:  Negative for discharge and redness.  Respiratory:  Positive for cough. Negative for shortness of breath and wheezing.   Gastrointestinal:  Negative for abdominal pain, diarrhea, nausea and vomiting.    Physical Exam Triage Vital Signs ED Triage Vitals [11/09/21 0904]  Enc Vitals Group     BP (!) 148/57     Pulse Rate 73     Resp 18     Temp 98.3 F (36.8 C)     Temp Source Oral     SpO2 96 %     Weight      Height      Head Circumference      Peak Flow      Pain Score 3     Pain Loc      Pain Edu?      Excl. in [place]?    No data found.  Updated Vital Signs BP (!) 148/57 (BP Location: Left Arm)    Pulse 73    Temp 98.3 F (36.8 C) (Oral)    Resp 18    SpO2 96%      Physical Exam Vitals and nursing note reviewed.  Constitutional:      General: She is not in acute distress.    Appearance: Normal appearance. She is not ill-appearing.  HENT:     Head: Normocephalic and atraumatic.     Nose: Congestion present.     Mouth/Throat:     Mouth: Mucous membranes are moist.     Pharynx: No oropharyngeal exudate or posterior oropharyngeal erythema.  Eyes:     Conjunctiva/sclera: Conjunctivae normal.  Cardiovascular:     Rate and Rhythm: Normal rate and regular rhythm.     Heart sounds: Normal heart sounds. No murmur heard. Pulmonary:     Effort: Pulmonary effort is normal. No respiratory distress.     Breath sounds: Normal breath sounds. No wheezing, rhonchi or rales.  Skin:    General: Skin is warm and dry.  Neurological:     Mental Status: She is alert.  Psychiatric:        Mood and Affect: Mood normal.        Thought Content: Thought content normal.     UC Treatments / Results  Labs (all labs ordered are listed, but only abnormal results are displayed) Labs Reviewed  COVID-19, FLU A+B NAA  POCT INFLUENZA A/B     EKG   Radiology PCV MYOCARDIAL PERFUSION WITH LEXISCAN  Result Date: 11/09/2021 Lexiscan Tetrofosmin stress test 11/08/2021: Lexiscan nuclear stress test performed using 1-day protocol. Decreased tracer uptake in inferior myocardium, more prominent in rest images. In absence of any associated wall motion and thickening abnormality, this most likely represents breast tissue attenuation, with imaging performed in sitting position. Stress LVEF 72%. Low risk study.    Procedures Procedures (including critical care time)  Medications Ordered in UC Medications - No data to display  Initial Impression / Assessment and Plan / UC Course  I have reviewed the triage vital signs and the nursing notes.  Pertinent labs & imaging results that were available during my care of the patient were reviewed by me and considered in my medical decision making (see chart for details).    Suspect viral etiology of illness. Will screen for covid and flu. Recommend she follow up with pulmonology. Encouraged follow up here sooner with any concerns.   Final Clinical Impressions(s) / UC Diagnoses   Final diagnoses:  Acute upper respiratory infection   Discharge Instructions   None    ED Prescriptions   None    PDMP not reviewed this encounter.   Francene Finders, PA-C 11/09/21 1108

## 2021-11-09 NOTE — Progress Notes (Signed)
Stress test is overall low risk. Will discuss further at upcoming office visit.

## 2021-11-10 LAB — COVID-19, FLU A+B NAA
Influenza A, NAA: NOT DETECTED
Influenza B, NAA: NOT DETECTED
SARS-CoV-2, NAA: NOT DETECTED

## 2021-11-11 DIAGNOSIS — J069 Acute upper respiratory infection, unspecified: Secondary | ICD-10-CM | POA: Diagnosis not present

## 2021-11-11 DIAGNOSIS — E119 Type 2 diabetes mellitus without complications: Secondary | ICD-10-CM | POA: Diagnosis not present

## 2021-11-11 NOTE — Progress Notes (Signed)
Primary Physician/Referring:  Prince Solian, MD  Patient ID: Janice [name], female    DOB: [birth year], 71 y.o.   MRN: [id number]  Chief Complaint  Patient presents with   Hypertension   Results   Follow-up    4 weeks   HPI:    Janice [name]  is a 71 y.o. female with hypertension, type 2 DM, moderate AI, carotid artery stenosis, liver cirrhosis, chronic lymphocytic lymphoma. History of significant anemia, which lead to discontinuation of aspirin. Patient reported intolerance to high dose Crestor in the past.   Patient presents for 4 weeks follow up and results of cardiac testing. At last office visit due to dyspnea and chest pain we ordered echocardiogram and stress test as well as lab work. Lipids are well controlled, CBC stable, and BNP/BMP/TSH within normal limits. Patient stress test was overall low risk. Echocardiogram revealed normal LVEF with grade 2 diastolic dysfunction and mild valvular disease as well as mild pulmonary hypertension, this is unchanged compared to previous echo 01/2021.   Patient is currently being treated by PCP for pneumonia.  She continues to complain of dyspnea.  Since last office visit she has had 1 additional episode of chest pressure 2 to 3 weeks ago lasting several hours.   Past Medical History:  Diagnosis Date   Allergy    Asthma    seasonal   Cellulitis 2013   Left toe   Cirrhosis ([place])    Common migraine    History of   Complication of anesthesia    Per pt/had breathing problems with "block" during rotator cuff surgery. 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[place])    Past Surgical History:  Procedure Laterality Date   ANKLE SURGERY     Left    COLONOSCOPY  05/2018   LEEP N/A 09/14/2018   Procedure: LOOP ELECTROSURGICAL EXCISION PROCEDURE (LEEP);  Surgeon: Arvella Nigh, MD;  Location: [facility];  Service: Gynecology;  Laterality: N/A;   MASS EXCISION Right 06/24/2021   Procedure: EXCISION RIGHT POSTERIOR NECK MASS;  Surgeon: Kinsinger, [name], MD;  Location: [facility];  Service: General;  Laterality: Right;   ROTATOR CUFF REPAIR Bilateral 2012, 2015   TONSILLECTOMY     TOTAL KNEE ARTHROPLASTY Left 04/08/2019   Procedure: TOTAL KNEE ARTHROPLASTY;  Surgeon: Elsie Saas, MD;  Location: [facility];  Service: Orthopedics;  Laterality: Left;   UPPER GI ENDOSCOPY  05/2018   Family History  Problem Relation Age of Onset   Breast cancer Mother    Bone cancer Mother    Heart failure Father    Diabetes Mellitus II Father    Hypertension Father    Diabetes Mellitus II Sister    Obesity Sister  Other Sister        retina problem   Hypertension Brother    Diabetes Mellitus II Brother    Colon cancer Neg Hx    Rectal cancer Neg Hx    Stomach cancer Neg Hx    Esophageal cancer Neg Hx    Pancreatic cancer Neg Hx    Liver disease Neg Hx     Social History   Tobacco Use   Smoking status: Never   Smokeless tobacco: Never  Substance Use Topics   Alcohol use: Not Currently    Comment: very rare   Marital Status: Widowed   ROS  Review of Systems  Cardiovascular:  Positive for chest pain (single episode) and dyspnea on exertion. Negative for claudication, leg swelling, near-syncope, orthopnea, palpitations, paroxysmal nocturnal dyspnea and syncope.        Decreased activity tolerance   Respiratory:  Positive for shortness of breath.   Neurological:  Negative for dizziness.   Objective  Blood pressure 129/63, pulse 85, temperature 97.7 F (36.5 C), temperature source Temporal, resp. rate 16, height 5' 4.5" (1.638 m), weight 220 lb 6.4 oz (100 kg), SpO2 96 %.  Vitals with BMI 11/15/2021 11/09/2021 10/18/2021  Height 5' 4.5" - -  Weight 220 lbs 6 oz - -  BMI 17.40 - -  Systolic 814 481 856  Diastolic 63 57 47  Pulse 85 73 76      Physical Exam Vitals reviewed.  Constitutional:      Appearance: She is obese.  Cardiovascular:     Rate and Rhythm: Normal rate and regular rhythm.     Pulses: Intact distal pulses.          Carotid pulses are  on the left side with bruit.    Heart sounds: S1 normal and S2 normal. No murmur heard.   No gallop.  Pulmonary:     Effort: Pulmonary effort is normal. No respiratory distress.     Breath sounds: No wheezing, rhonchi or rales.  Musculoskeletal:     Right lower leg: No edema.     Left lower leg: No edema.  Neurological:     Mental Status: She is alert.  Physical exam unchanged compared to previous office visit.  Laboratory examination:   Recent Labs    05/18/21 1431 05/18/21 1431 06/07/21 1149 07/06/21 1424 08/24/21 0940 10/18/21 1222  NA 139  --  138 138 137 137  K 4.5  --  4.9 4.9 4.6 4.9  CL 106  --  105 101 105 102  CO2 24  --  $R'27 23 24 24  'MM$ GLUCOSE 324*  --  141* 210* 229* 150*  BUN 13  --  $R'15 14 14 11  'rI$ CREATININE 1.01*   < > 0.67 0.75 0.82 0.77  CALCIUM 8.9  --  9.1 9.4 8.7* 9.1  GFRNONAA 60*  --  >60  --  >60  --    < > = values in this interval not displayed.   CrCl cannot be calculated (Patient's most recent lab result is older than the maximum 21 days allowed.).  CMP Latest Ref Rng & Units 10/18/2021 08/24/2021 07/06/2021  Glucose 70 - 99 mg/dL 150(H) 229(H) 210(H)  BUN 8 - 27 mg/dL $Remove'11 14 14  'gttyxhx$ Creatinine 0.57 - 1.00 mg/dL 0.77 0.82 0.75  Sodium 134 - 144 mmol/L 137  137 138  Potassium 3.5 - 5.2 mmol/L 4.9 4.6 4.9  Chloride 96 - 106 mmol/L 102 105 101  CO2 20 - 29  mmol/L '24 24 23  '$ Calcium 8.7 - 10.3 mg/dL 9.1 8.7(L) 9.4  Total Protein 6.5 - 8.1 g/dL - 6.2(L) -  Total Bilirubin 0.3 - 1.2 mg/dL - 0.5 -  Alkaline Phos 38 - 126 U/L - 82 -  AST 15 - 41 U/L - 14(L) -  ALT 0 - 44 U/L - 19 -   CBC Latest Ref Rng & Units 10/18/2021 08/24/2021 06/07/2021  WBC 3.4 - 10.8 x10E3/uL 18.4(H) 19.5(H) 17.5(H)  Hemoglobin 11.1 - 15.9 g/dL 10.3(L) 10.4(L) 11.9(L)  Hematocrit 34.0 - 46.6 % 33.0(L) 33.2(L) 38.7  Platelets 150 - 450 x10E3/uL 100(LL) 96(L) 105(L)    Lipid Panel Recent Labs    10/18/21 1222  CHOL 125  TRIG 86  LDLCALC 65  HDL 43    HEMOGLOBIN A1C Lab Results  Component Value Date   HGBA1C 7.5 (H) 06/07/2021   MPG 168.55 06/07/2021   TSH Recent Labs    10/18/21 1222  TSH 1.400    External labs:  05/28/2021: Glucose 141, creatinine 0.8, GFR >60, sodium 135, potassium 5.1, BUN 12 Hgb 11.8, HCT 36.8 Total cholesterol 113, triglycerides 70, HDL 35, LDL 64 Lipoprotein B 60 (normal) TSH 1.52 A1c 7.6%  05/13/2020: Glucose 71, BUN 11, creatinine 0.7, GFR >60, sodium 142, potassium 4.6, AST 22, ALT 34, alk phos 90 Hemoglobin 12.4, hematocrit 38.4, MCV 84.1, platelet 83 Total cholesterol 120, triglycerides 86, HDL 36, LDL 67, lipoprotein B 59 TSH 2.03 A1c 6.2%  12/31/2019: Glucose BUN/Cr 10/0.8. eGFR 71 HbA1C 6.7%   09/2018: Chol 151, TG 164, HDL 33, LDL 85 TSH 1.9 normal  Allergies   Allergies  Allergen Reactions   Benzonatate Anaphylaxis and Hives   Fluticasone Furoate-Vilanterol Anaphylaxis and Hives    Other reaction(s): hives  breo ellipta   Iodine Hives, Swelling and Other (See Comments)    And Betadine/caused hives and swelling   Latex Hives, Itching, Rash and Other (See Comments)    Burning   Simvastatin Other (See Comments)    Caused neuropathy in arms Neuropathy in arms   Bupropion Other (See Comments)     Negative thoughts. Crazy thoughts   Levofloxacin Other (See Comments)    Joint aches//leg, shoulder pain Joint pain   Penicillins Hives   Povidone-Iodine Hives   Amoxicillin Hives    Blisters Did it involve swelling of the face/tongue/throat, SOB, or low BP? Yes Did it involve sudden or severe rash/hives, skin peeling, or any reaction on the inside of your mouth or nose? Yes Did you need to seek medical attention at a hospital or doctor's office?Unknown When did it last happen? 2019      If all above answers are NO, may proceed with cephalosporin use.    Chlorthalidone Other (See Comments)    Ears ringing    Diflucan [Fluconazole] Hives and Other (See Comments)    blister   Povidone Iodine    Amoxicillin-Pot Clavulanate Rash   Doxycycline Itching and Diarrhea    Medications Prior to Visit:   Outpatient Medications Prior to Visit  Medication Sig Dispense Refill   albuterol (PROVENTIL HFA;VENTOLIN HFA) 108 (90 Base) MCG/ACT inhaler Inhale 1-2 puffs into the lungs every 6 (six) hours as needed (Cough).     amLODipine (NORVASC) 10 MG tablet Take 10 mg by mouth daily.     aspirin EC 81 MG tablet Take 1 tablet (81 mg total) by mouth daily. Swallow whole. 90 tablet 3   azelastine (ASTELIN) 0.1 % nasal spray Place 1 spray into  both nostrils 2 (two) times daily. Use in each nostril as directed 90 mL 3   Calcium-Magnesium-Zinc (CAL-MAG-ZINC PO) Take 2 tablets by mouth daily.     Carboxymethylcellul-Glycerin (REFRESH OPTIVE PF OP) Place 1 drop into both eyes daily as needed (dry eyes). Non-pres     Coenzyme Q10 (CO Q-10) 100 MG CAPS Take 100 mg by mouth daily.     desoximetasone (TOPICORT) 0.25 % cream Apply 1 application topically 4 (four) times daily as needed (rash).     diclofenac sodium (VOLTAREN) 1 % GEL Apply 2 g topically 2 (two) times daily as needed (knee pain/ foot /Triger finger).  2   fluticasone (FLONASE) 50 MCG/ACT nasal spray Place 1 spray into both  nostrils daily.     gabapentin (NEURONTIN) 300 MG capsule 600 mg daily.     insulin NPH Human (HUMULIN N,NOVOLIN N) 100 UNIT/ML injection Inject 40-75 Units into the skin See admin instructions. Inject 45 units subcutaneously at breakfast, 30 units at lunch & 45 units at supper     insulin regular (NOVOLIN R,HUMULIN R) 100 units/mL injection Inject 30 Units into the skin 3 (three) times daily before meals.     irbesartan (AVAPRO) 300 MG tablet Take 300 mg by mouth daily.     metFORMIN (GLUCOPHAGE-XR) 500 MG 24 hr tablet Take 1,000 mg by mouth 2 (two) times daily.     omeprazole (PRILOSEC) 40 MG capsule Take 40 mg by mouth 2 (two) times daily.     PARoxetine (PAXIL) 10 MG tablet Take 10 mg by mouth daily.     rosuvastatin (CRESTOR) 10 MG tablet Take 1 tablet (10 mg total) by mouth daily. (Patient taking differently: Take 10 mg by mouth daily with supper.) 90 tablet 3   spironolactone (ALDACTONE) 50 MG tablet Take 1 tablet by mouth once daily 90 tablet 3   terconazole (TERAZOL 3) 0.8 % vaginal cream Place 1 applicator vaginally as needed (yeast infections).      vitamin C (ASCORBIC ACID) 500 MG tablet Take 500 mg by mouth 2 (two) times a day.     Wheat Dextrin (BENEFIBER PO) Take by mouth. Take 3/4 tsp daily     hydrALAZINE (APRESOLINE) 25 MG tablet Take 1 tablet (25 mg total) by mouth 2 (two) times daily. 60 tablet 3   No facility-administered medications prior to visit.   Final Medications at End of Visit    Current Meds  Medication Sig   albuterol (PROVENTIL HFA;VENTOLIN HFA) 108 (90 Base) MCG/ACT inhaler Inhale 1-2 puffs into the lungs every 6 (six) hours as needed (Cough).   amLODipine (NORVASC) 10 MG tablet Take 10 mg by mouth daily.   aspirin EC 81 MG tablet Take 1 tablet (81 mg total) by mouth daily. Swallow whole.   azelastine (ASTELIN) 0.1 % nasal spray Place 1 spray into both nostrils 2 (two) times daily. Use in each nostril as directed   Calcium-Magnesium-Zinc  (CAL-MAG-ZINC PO) Take 2 tablets by mouth daily.   Carboxymethylcellul-Glycerin (REFRESH OPTIVE PF OP) Place 1 drop into both eyes daily as needed (dry eyes). Non-pres   Coenzyme Q10 (CO Q-10) 100 MG CAPS Take 100 mg by mouth daily.   desoximetasone (TOPICORT) 0.25 % cream Apply 1 application topically 4 (four) times daily as needed (rash).   diclofenac sodium (VOLTAREN) 1 % GEL Apply 2 g topically 2 (two) times daily as needed (knee pain/ foot /Triger finger).   fluticasone (FLONASE) 50 MCG/ACT nasal spray Place 1 spray into both nostrils daily.  gabapentin (NEURONTIN) 300 MG capsule 600 mg daily.   insulin NPH Human (HUMULIN N,NOVOLIN N) 100 UNIT/ML injection Inject 40-75 Units into the skin See admin instructions. Inject 45 units subcutaneously at breakfast, 30 units at lunch & 45 units at supper   insulin regular (NOVOLIN R,HUMULIN R) 100 units/mL injection Inject 30 Units into the skin 3 (three) times daily before meals.   irbesartan (AVAPRO) 300 MG tablet Take 300 mg by mouth daily.   metFORMIN (GLUCOPHAGE-XR) 500 MG 24 hr tablet Take 1,000 mg by mouth 2 (two) times daily.   omeprazole (PRILOSEC) 40 MG capsule Take 40 mg by mouth 2 (two) times daily.   PARoxetine (PAXIL) 10 MG tablet Take 10 mg by mouth daily.   rosuvastatin (CRESTOR) 10 MG tablet Take 1 tablet (10 mg total) by mouth daily. (Patient taking differently: Take 10 mg by mouth daily with supper.)   spironolactone (ALDACTONE) 50 MG tablet Take 1 tablet by mouth once daily   terconazole (TERAZOL 3) 0.8 % vaginal cream Place 1 applicator vaginally as needed (yeast infections).    vitamin C (ASCORBIC ACID) 500 MG tablet Take 500 mg by mouth 2 (two) times a day.   Wheat Dextrin (BENEFIBER PO) Take by mouth. Take 3/4 tsp daily   Radiology:   No results found.  Cardiac Studies:  [facility] ECHOCARDIOGRAM COMPLETE 16/06/9603 Normal LV systolic function with visual EF 60-65%. Left ventricle cavity is normal in size. Mild  to moderate left ventricular hypertrophy. Normal global wall motion. Doppler evidence of grade II (pseudonormal) diastolic dysfunction, elevated LAP. Left atrial cavity is mildly dilated. Mild (Grade I) aortic regurgitation. Mild (Grade I) mitral regurgitation. Mild tricuspid regurgitation. No evidence of pulmonary hypertension. Compared to study dated 02/27/2019: G1DD is now G2DD, elevated LAP is new, moderate AR is now mild, otherwise no significant change.   [facility] MYOCARDIAL PERFUSION WITH LEXISCAN 11/08/2021 Lexiscan nuclear stress test performed using 1-day protocol. Decreased tracer uptake in inferior myocardium, more prominent in rest images. In absence of any associated wall motion and thickening abnormality, this most likely represents breast tissue attenuation, with imaging performed in sitting position. Stress LVEF 72%. Low risk study.  Carotid artery duplex 54/05/8118:  Peak systolic velocities in the right bifurcation, internal, external and  common carotid arteries are within normal limits.  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 Stenosis in the  left external carotid artery (<50%).  Antegrade right vertebral artery flow. Antegrade left vertebral artery  flow.  Compared to 01/29/2020, left ICA stenosis was in the 50-69% range. Follow  up in one year is appropriate if clinically indicated.   Echocardiogram 02/03/2021:  Normal LV systolic function with visual EF 60-65%. Left ventricle cavity  is normal in size. Mild to moderate left ventricular hypertrophy. Normal  global wall motion. Doppler evidence of grade II (pseudonormal) diastolic dysfunction, elevated LAP.  Left atrial cavity is mildly dilated.  Mild (Grade I) aortic regurgitation.  Mild (Grade I) mitral regurgitation.  Mild tricuspid regurgitation. No evidence of pulmonary hypertension.  Compared to study dated 02/27/2019: G1DD is now G2DD, elevated LAP is new, moderate AR is now mild, otherwise no  significant change.   EKG:  10/18/2021: Sinus rhythm at a rate of 69 bpm.  Normal axis.  Poor progression, cannot exclude intraseptal infarct old.  No evidence of ischemia or underlying injury pattern.  Compared EKG 03/08/2021, no significant change.  EKG 03/09/2020: Sinus rhythm 76 bpm Normal EKG  Assessment     ICD-10-CM   1.  Dyspnea on exertion  R06.09     2. Precordial pain  R07.2     3. Grade II diastolic dysfunction  X95.84        There are no discontinued medications.   No orders of the defined types were placed in this encounter.   Recommendations:   Janice [name] is a 71 y.o. female with hypertension, type 2 DM, moderate AI, carotid artery stenosis. History of significant anemia, which lead to discontinuation of aspirin. Patient reported intolerance to high dose Crestor in the past.   Patient presents for 4 weeks follow up and results of cardiac testing. At last office visit due to dyspnea and chest pain we ordered echocardiogram and stress test as well as lab work. Lipids are well controlled, CBC stable, and BNP/BMP/TSH within normal limits. Patient stress test was overall low risk. Echocardiogram revealed normal LVEF with grade 2 diastolic dysfunction, mild valvular disease, and mild pulmonary hypertension, which is unchanged compared to previous.  Reviewed and discussed results of stress test and echocardiogram.  Suspect patient's dyspnea to be multifactorial including diastolic dysfunction, obesity and deconditioning as well as current underlying pulmonary etiology given pneumonia.  Patient's symptoms of chest pain are quite atypical and stress test is reassuring that there is no significant CAD.  Discussed with patient that I would recommend watchful waiting as well as focusing on weight loss and increasing physical activity.  If patient continues to have chest pain could consider further ischemic investigation, however at this time do not feel this is indicated.  Blood  pressure is well controlled.  Will not make changes to patient's medications at this time.  Continue aspirin, amlodipine, hydralazine, irbesartan, spironolactone, Crestor.  Follow-up in 3 months, sooner if needed.   Alethia Berthold, PA-C 11/15/2021, 12:01 PM Office: [phone]

## 2021-11-15 ENCOUNTER — Ambulatory Visit: Payer: HMO | Admitting: Student

## 2021-11-15 ENCOUNTER — Other Ambulatory Visit: Payer: Self-pay

## 2021-11-15 ENCOUNTER — Encounter: Payer: Self-pay | Admitting: Student

## 2021-11-15 VITALS — BP 129/63 | HR 85 | Temp 97.7°F | Resp 16 | Ht 64.5 in | Wt 220.4 lb

## 2021-11-15 DIAGNOSIS — I5189 Other ill-defined heart diseases: Secondary | ICD-10-CM | POA: Diagnosis not present

## 2021-11-15 DIAGNOSIS — R0609 Other forms of dyspnea: Secondary | ICD-10-CM | POA: Diagnosis not present

## 2021-11-15 DIAGNOSIS — R072 Precordial pain: Secondary | ICD-10-CM

## 2021-11-16 DIAGNOSIS — J069 Acute upper respiratory infection, unspecified: Secondary | ICD-10-CM | POA: Diagnosis not present

## 2021-11-16 DIAGNOSIS — R051 Acute cough: Secondary | ICD-10-CM | POA: Diagnosis not present

## 2021-11-16 DIAGNOSIS — E119 Type 2 diabetes mellitus without complications: Secondary | ICD-10-CM | POA: Diagnosis not present

## 2021-11-18 ENCOUNTER — Ambulatory Visit: Payer: HMO | Admitting: Physical Therapy

## 2021-11-25 ENCOUNTER — Encounter: Payer: Self-pay | Admitting: Physical Therapy

## 2021-11-25 ENCOUNTER — Other Ambulatory Visit: Payer: Self-pay

## 2021-11-25 ENCOUNTER — Ambulatory Visit: Payer: HMO | Attending: Physician Assistant | Admitting: Physical Therapy

## 2021-11-25 DIAGNOSIS — M25571 Pain in right ankle and joints of right foot: Secondary | ICD-10-CM | POA: Diagnosis not present

## 2021-11-25 DIAGNOSIS — R600 Localized edema: Secondary | ICD-10-CM | POA: Insufficient documentation

## 2021-11-25 DIAGNOSIS — M6281 Muscle weakness (generalized): Secondary | ICD-10-CM | POA: Insufficient documentation

## 2021-11-25 DIAGNOSIS — R262 Difficulty in walking, not elsewhere classified: Secondary | ICD-10-CM | POA: Insufficient documentation

## 2021-11-25 NOTE — Therapy (Signed)
[facility] ?[place] [address] ?[place], [place], [address] ?Phone: [phone]   Fax:  [phone] ? ?Physical Therapy Treatment ? ?Patient Details  ?Name: Janice [name] ?MRN: [id number] ?Date of Birth: 09/24/1950 ?Referring Provider (PT): Janice [name] ? ? ?Encounter Date: 11/25/2021 ? ? PT End of Session - 11/25/21 1752   ? ? Visit Number 3   ? Number of Visits 16   ? Date for PT Re-Evaluation 12/17/21   ? PT Start Time 9211   ? PT Stop Time 9417   ? PT Time Calculation (min) 39 min   ? Activity Tolerance Patient tolerated treatment well   ? Behavior During Therapy Janice [name] for tasks assessed/performed   ? ?  ?  ? ?  ? ? ?Past Medical History:  ?Diagnosis Date  ? Allergy   ? Asthma   ? seasonal  ? Cellulitis 2013  ? Left toe  ? Cirrhosis ([place])   ? Common migraine   ? History of  ? Complication of anesthesia   ? Per pt/had breathing problems with "block" during rotator cuff surgery. 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  ? DDD (degenerative disc disease)   ? Depression   ? denies takes paxil for migraines  ? Diabetes mellitus   ? type 2  ? Diabetic peripheral neuropathy ([place])   ? Diverticulitis   ? Diverticulosis   ? DJD (degenerative joint disease)   ? GERD (gastroesophageal reflux disease)   ? History of colon polyps   ? hyperplastic  ? History of gastric polyp   ? Hyperlipidemia   ? Hypertension   ? Iron deficiency anemia   ? Obesity   ? OSA on CPAP   ? cpap  ? Pneumonia   ? april 2020  mild  ? Primary localized osteoarthritis of left knee 03/27/2019  ? Sensorineural hearing loss   ? 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 ? ?Past Surgical History:  ?Procedure Laterality Date  ? ANKLE SURGERY    ? Left   ? COLONOSCOPY  05/2018  ? LEEP N/A 09/14/2018  ? Procedure: LOOP ELECTROSURGICAL EXCISION PROCEDURE (LEEP);  Surgeon: Janice Nigh, MD;  [facility];  Service: Gynecology;  Laterality: N/A;  ? MASS EXCISION Right 06/24/2021  ? Procedure: EXCISION  RIGHT POSTERIOR NECK MASS;  Surgeon: Janice [name], Janice Bruce, MD;  [facility];  Service: General;  Laterality: Right;  ? ROTATOR CUFF REPAIR Bilateral 2012, 2015  ? TONSILLECTOMY    ? TOTAL KNEE ARTHROPLASTY Left 04/08/2019  ? Procedure: TOTAL KNEE ARTHROPLASTY;  Surgeon: Janice Saas, MD;  [facility];  Service: Orthopedics;  Laterality: Left;  ? UPPER GI ENDOSCOPY  05/2018  ? ? ?There were no vitals filed for this visit. ? ? Subjective Assessment - 11/25/21 1708   ? ? Subjective I felt really good after you worked on me last time, but right after that I got really with with a respiratory infection and wasn't able to keep up what I was supposed to be doing in terms of HEP. It still feels very tight and in spasm. The stretches really helped too.   ? Pertinent History type 2 DM, moderate AI, carotid artery stenosis, liver cirrhosis, chronic lymphocytic lymphoma   ? Patient Stated Goals Get back to stationary bike and walk, would like to get back to swim, get back to her old exercise routine.   ? Currently in Pain? Yes   ? Pain Score 3    ?  Pain Location Calf   ? Pain Orientation Right;Posterior   ? Pain Descriptors / Indicators Sore;Tightness   ? Pain Type Acute pain   ? ?  ?  ? ?  ? ? ? ? ? ? ? ? ? ? ? ? ? ? ? ? ? ? ? ? Janice [name] Adult PT Treatment/Exercise - 11/25/21 0001   ? ?  ? Knee/Hip Exercises: Stretches  ? Gastroc Stretch 4 reps;30 seconds;Right   ? Soleus Stretch Right;4 reps;30 seconds   ?  ? Knee/Hip Exercises: Aerobic  ? Nustep nustep L3 x5 minutes BLEs only   ?  ? Knee/Hip Exercises: Standing  ? Gait Training heel toe gait training 1x280f   ? Other Standing Knee Exercises heel walking x237f toe walking x2525f ?  ? Knee/Hip Exercises: Seated  ? Other Seated Knee/Hip Exercises 4 way ankle with red TB 1x15   ?  ? Manual Therapy  ? Manual Therapy Soft tissue mobilization   ? Soft tissue mobilization IASTM R calf in sidelying   ? ?  ?  ? ?  ? ? ? ? ? ? ? ? ? ? PT Education - 11/25/21 1752   ? ?  Education Details POC moving forward   ? Person(s) Educated Patient   ? Methods Explanation   ? Comprehension Verbalized understanding   ? ?  ?  ? ?  ? ? ? PT Short Term Goals - 10/21/21 1405   ? ?  ? PT SHORT TERM GOAL #1  ? Title I with HEP   ? Time 4   ? Period Weeks   ? Status New   ? Target Date 11/18/21   ? ?  ?  ? ?  ? ? ? ? PT Long Term Goals - 10/21/21 1405   ? ?  ? PT LONG TERM GOAL #1  ? Title I with final HEP   ? Time 8   ? Period Weeks   ? Status New   ? Target Date 12/16/21   ?  ? PT LONG TERM GOAL #2  ? Title Patient will demosntrate RLE strength of at least 4/5 throughout all joints and all planes of movement.   ? Baseline R hip and ankle with weakness-3+/5   ? Time 8   ? Period Weeks   ? Status New   ? Target Date 12/17/21   ?  ? PT LONG TERM GOAL #3  ? Title Patient will perform 20 single limb calf raises on R with pain</=3/10   ? Baseline 0/10   ? Time 8   ? Period Weeks   ? Status New   ? Target Date 12/17/21   ?  ? PT LONG TERM GOAL #4  ? Title Patient will be able to walk for at least 15 minutes with R LE pain</= 3/10   ? Baseline < 5 minutes   ? Time 8   ? Period Weeks   ? Status New   ? Target Date 12/17/21   ? ?  ?  ? ?  ? ? ? ? ? ? ? ? Plan - 11/25/21 1752   ? ? Clinical Impression Statement [name] today doing OK, she got really sick with a respiratory infection after last time I saw her and had to cancel her appointments. She?s doing OK, her R calf is still pretty tight. We warmed up on the Nustep, then worked on appropriate stretches and open and closed chain exercises, finished with  IASTM to R calf. Very tender medial gastroc head and I think she would benefit from DN to gastroc/soleus complex if she is open to this.   ? Personal Factors and Comorbidities Age;Fitness;Past/Current Experience   ? Examination-Activity Limitations Locomotion Level;Transfers;Squat;Stairs;Stand;Lift   ? Examination-Participation Restrictions Community Activity   ? Stability/Clinical Decision Making  Stable/Uncomplicated   ? Clinical Decision Making Low   ? Rehab Potential Good   ? PT Frequency 1x / week   ? PT Duration 8 weeks   ? PT Treatment/Interventions ADLs/Self Care Home Management;Moist Heat;Iontophoresis '4mg'$ /ml Dexamethasone;Ultrasound;Cryotherapy;Electrical Stimulation;Manual techniques;Balance training;Therapeutic exercise;Therapeutic activities;Functional mobility training;Stair training;Gait training;Patient/family education;Dry needling;Passive range of motion   ? PT Next Visit Plan DN to gastroc/soleus? Needs gastroc stretching and strengthening R. Did not like ionto   ? PT [place]   ? Consulted and Agree with Plan of Care Patient   ? ?  ?  ? ?  ? ? ?Patient will benefit from skilled therapeutic intervention in order to improve the following deficits and impairments:  Abnormal gait, Decreased range of motion, Difficulty walking, Increased fascial restricitons, Pain, Decreased mobility, Decreased strength, Postural dysfunction, Increased edema, Improper body mechanics, Impaired flexibility ? ?Visit Diagnosis: ?Pain in right ankle and joints of right foot ? ?Muscle weakness (generalized) ? ?Localized edema ? ?Difficulty in walking, not elsewhere classified ? ? ? ? ?Problem List ?Patient Active Problem List  ? Diagnosis Date Noted  ? 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 Upper airway cough syndrome 04/29/2021  ? Cough variant asthma 04/29/2021  ? Liver cirrhosis secondary to NASH (nonalcoholic steatohepatitis) ([place]) 08/21/2020  ? Abdominal obesity and metabolic syndrome 90/38/3338  ? Class 2 severe obesity with serious comorbidity and body mass index (BMI) of 37.0 to 37.9 in adult [facility]) 08/21/2020  ? Thrombocytopenia ([place]) 08/21/2020  ? Moderate aortic regurgitation 03/09/2020  ? Asymptomatic stenosis of left carotid artery 03/09/2020  ? Primary localized osteoarthritis of left knee 03/27/2019  ? DDD (degenerative disc disease), lumbar   ? Obstructive sleep apnea treated  with continuous positive airway pressure (CPAP) 08/27/2018  ? Asthma with acute exacerbation 12/07/2013  ? Diverticulitis 04/23/2012  ? Essential hypertension 04/23/2012  ? Controlled type 2 diabetes mellitus w

## 2021-11-27 ENCOUNTER — Other Ambulatory Visit: Payer: Self-pay | Admitting: Student

## 2021-11-30 ENCOUNTER — Encounter: Payer: Self-pay | Admitting: Physical Therapy

## 2021-11-30 ENCOUNTER — Ambulatory Visit: Payer: HMO | Admitting: Physical Therapy

## 2021-11-30 ENCOUNTER — Other Ambulatory Visit: Payer: Self-pay

## 2021-11-30 DIAGNOSIS — R600 Localized edema: Secondary | ICD-10-CM

## 2021-11-30 DIAGNOSIS — M25571 Pain in right ankle and joints of right foot: Secondary | ICD-10-CM

## 2021-11-30 DIAGNOSIS — R262 Difficulty in walking, not elsewhere classified: Secondary | ICD-10-CM

## 2021-11-30 DIAGNOSIS — M6281 Muscle weakness (generalized): Secondary | ICD-10-CM

## 2021-11-30 NOTE — Therapy (Signed)
[facility] ?[place] ?[address] ?[place], [place], [address] ?Phone: [phone]   Fax:  [phone] ? ?Physical Therapy Treatment ? ?Patient Details  ?Name: Janice [name] ?MRN: [id number] ?Date of Birth: August 07, 1951 ?Referring Provider (PT): Murphy-Wainer ? ? ?Encounter Date: 11/30/2021 ? ? PT End of Session - 11/30/21 1848   ? ? Visit Number 4   ? Date for PT Re-Evaluation 12/17/21   ? PT Start Time 1708   ? PT Stop Time 3382   ? PT Time Calculation (min) 44 min   ? Activity Tolerance Patient tolerated treatment well   ? Behavior During Therapy [facility] for tasks assessed/performed   ? ?  ?  ? ?  ? ? ?Past Medical History:  ?Diagnosis Date  ? Allergy   ? Asthma   ? seasonal  ? Cellulitis 2013  ? Left toe  ? Cirrhosis ([place])   ? Common migraine   ? History of  ? Complication of anesthesia   ? Per pt/had breathing problems with "block" during rotator cuff surgery. 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  ? DDD (degenerative disc disease)   ? Depression   ? denies takes paxil for migraines  ? Diabetes mellitus   ? type 2  ? Diabetic peripheral neuropathy ([place])   ? Diverticulitis   ? Diverticulosis   ? DJD (degenerative joint disease)   ? GERD (gastroesophageal reflux disease)   ? History of colon polyps   ? hyperplastic  ? History of gastric polyp   ? Hyperlipidemia   ? Hypertension   ? Iron deficiency anemia   ? Obesity   ? OSA on CPAP   ? cpap  ? Pneumonia   ? april 2020  mild  ? Primary localized osteoarthritis of left knee 03/27/2019  ? Sensorineural hearing loss   ? 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 ? ?Past Surgical History:  ?Procedure Laterality Date  ? ANKLE SURGERY    ? Left   ? COLONOSCOPY  05/2018  ? LEEP N/A 09/14/2018  ? Procedure: LOOP ELECTROSURGICAL EXCISION PROCEDURE (LEEP);  Surgeon: Arvella Nigh, MD;  Location: [facility];  Service: Gynecology;  Laterality: N/A;  ? MASS EXCISION Right 06/24/2021  ? Procedure: EXCISION RIGHT POSTERIOR NECK  MASS;  Surgeon: Kinsinger, [name], MD;  [facility];  Service: General;  Laterality: Right;  ? ROTATOR CUFF REPAIR Bilateral 2012, 2015  ? TONSILLECTOMY    ? TOTAL KNEE ARTHROPLASTY Left 04/08/2019  ? Procedure: TOTAL KNEE ARTHROPLASTY;  Surgeon: Elsie Saas, MD;  [facility];  Service: Orthopedics;  Laterality: Left;  ? UPPER GI ENDOSCOPY  05/2018  ? ? ?There were no vitals filed for this visit. ? ? Subjective Assessment - 11/30/21 1721   ? ? Subjective Patient reports that she feels like she is getting better, reports had a good weekend, reports some cramping in the left leg   ? Pain Score 2    ? Pain Location Calf   ? Pain Orientation Right   ? Pain Descriptors / Indicators Sore;Tightness   ? Pain Relieving Factors stretches help   ? ?  ?  ? ?  ? ? ? ? ? ? ? ? ? ? ? ? ? ? ? ? ? ? ? ? [facility] Adult PT Treatment/Exercise - 11/30/21 0001   ? ?  ? Knee/Hip Exercises: Stretches  ? Passive Hamstring Stretch Both;3 reps;10 seconds   ? Gastroc Stretch 4 reps;30 seconds;Right   ?  Soleus Stretch Right;4 reps;30 seconds   ?  ? Knee/Hip Exercises: Aerobic  ? Nustep level 4 x 5 minutes   ?  ? Knee/Hip Exercises: Machines for Strengthening  ? Cybex Knee Extension no weight2x10   ? Cybex Knee Flexion 15# 2x10   ?  ? Knee/Hip Exercises: Seated  ? Ball Squeeze 4x5 reps   ? Knee/Hip Flexion clamshells 4x5   ? Other Seated Knee/Hip Exercises 4 way ankle with red TB 1x15   ?  ? Knee/Hip Exercises: Supine  ? Other Supine Knee/Hip Exercises pelvic tilts   ? Other Supine Knee/Hip Exercises feet on ball K2C, small trunk rotation, posterior activation, tried isometric abs but very difficulty to get abs engaged, did pelvic tilts   ? ?  ?  ? ?  ? ? ? ? ? ? ? ? ? ? ? ? PT Short Term Goals - 11/30/21 1850   ? ?  ? PT SHORT TERM GOAL #1  ? Title I with HEP   ? Status Achieved   ? ?  ?  ? ?  ? ? ? ? PT Long Term Goals - 10/21/21 1405   ? ?  ? PT LONG TERM GOAL #1  ? Title I with final HEP   ? Time 8   ? Period Weeks   ? Status New    ? Target Date 12/16/21   ?  ? PT LONG TERM GOAL #2  ? Title Patient will demosntrate RLE strength of at least 4/5 throughout all joints and all planes of movement.   ? Baseline R hip and ankle with weakness-3+/5   ? Time 8   ? Period Weeks   ? Status New   ? Target Date 12/17/21   ?  ? PT LONG TERM GOAL #3  ? Title Patient will perform 20 single limb calf raises on R with pain</=3/10   ? Baseline 0/10   ? Time 8   ? Period Weeks   ? Status New   ? Target Date 12/17/21   ?  ? PT LONG TERM GOAL #4  ? Title Patient will be able to walk for at least 15 minutes with R LE pain</= 3/10   ? Baseline < 5 minutes   ? Time 8   ? Period Weeks   ? Status New   ? Target Date 12/17/21   ? ?  ?  ? ?  ? ? ? ? ? ? ? ? Plan - 11/30/21 1849   ? ? Clinical Impression Statement Patient is very debilitated, had to do mostly sets of 5 due to fatigue and mms pain, she is also very tight in the LE's especially the calves, I tried to add some generall overall LE and core strength, she has a significant hernia and with a cough I felt good core activation but with trying to do isometric abs, no core mm activation, had to go back to basic pelvic tilts to get some core.   ? ?  ?  ? ?  ? ? ?Patient will benefit from skilled therapeutic intervention in order to improve the following deficits and impairments:  Abnormal gait, Decreased range of motion, Difficulty walking, Increased fascial restricitons, Pain, Decreased mobility, Decreased strength, Postural dysfunction, Increased edema, Improper body mechanics, Impaired flexibility ? ?Visit Diagnosis: ?Pain in right ankle and joints of right foot ? ?Muscle weakness (generalized) ? ?Localized edema ? ?Difficulty in walking, not elsewhere classified ? ? ? ? ?Problem List ?Patient Active  Problem List  ? Diagnosis Date Noted  ? 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 Upper airway cough syndrome 04/29/2021  ? Cough variant asthma 04/29/2021  ? Liver cirrhosis secondary to NASH (nonalcoholic  steatohepatitis) ([place]) 08/21/2020  ? Abdominal obesity and metabolic syndrome 50/38/8828  ? Class 2 severe obesity with serious comorbidity and body mass index (BMI) of 37.0 to 37.9 in adult [facility]) 08/21/2020  ? Thrombocytopenia ([place]) 08/21/2020  ? Moderate aortic regurgitation 03/09/2020  ? Asymptomatic stenosis of left carotid artery 03/09/2020  ? Primary localized osteoarthritis of left knee 03/27/2019  ? DDD (degenerative disc disease), lumbar   ? Obstructive sleep apnea treated with continuous positive airway pressure (CPAP) 08/27/2018  ? Asthma with acute exacerbation 12/07/2013  ? Diverticulitis 04/23/2012  ? Essential hypertension 04/23/2012  ? Controlled type 2 diabetes mellitus without complication, with long-term current use of insulin ([place]) 04/23/2012  ? Colon polyps 04/23/2012  ? ? Sumner Boast, PT ?11/30/2021, 6:51 PM ? ?[place] ?[place] ?[address] ?[place], [place], [address] ?Phone: [phone]   Fax:  [phone] ? ?Name: Janice [name] ?MRN: [id number] ?Date of Birth: [birth year] ? ? ? ?

## 2021-12-08 ENCOUNTER — Ambulatory Visit: Payer: HMO | Admitting: Physical Therapy

## 2021-12-08 ENCOUNTER — Encounter: Payer: Self-pay | Admitting: Physical Therapy

## 2021-12-08 DIAGNOSIS — R600 Localized edema: Secondary | ICD-10-CM

## 2021-12-08 DIAGNOSIS — M6281 Muscle weakness (generalized): Secondary | ICD-10-CM

## 2021-12-08 DIAGNOSIS — M25571 Pain in right ankle and joints of right foot: Secondary | ICD-10-CM | POA: Diagnosis not present

## 2021-12-08 DIAGNOSIS — R262 Difficulty in walking, not elsewhere classified: Secondary | ICD-10-CM

## 2021-12-08 NOTE — Therapy (Signed)
[facility] ?[place] ?[address] ?[place], [place], [address] ?Phone: [phone]   Fax:  [phone] ? ?Physical Therapy Treatment ? ?Patient Details  ?Name: Janice [name] ?MRN: [id number] ?Date of Birth: 1951/03/31 ?Referring Provider (PT): Murphy-Wainer ? ? ?Encounter Date: 12/08/2021 ? ? PT End of Session - 12/08/21 1749   ? ? Visit Number 5   ? Number of Visits 6   ? Date for PT Re-Evaluation 01/19/22   to allow for flexibility with scheduling  ? PT Start Time 1749   ? PT Stop Time 4496   ? PT Time Calculation (min) 39 min   ? Activity Tolerance Patient tolerated treatment well   ? Behavior During Therapy [facility] for tasks assessed/performed   ? ?  ?  ? ?  ? ? ?Past Medical History:  ?Diagnosis Date  ? Allergy   ? Asthma   ? seasonal  ? Cellulitis 2013  ? Left toe  ? Cirrhosis ([place])   ? Common migraine   ? History of  ? Complication of anesthesia   ? Per pt/had breathing problems with "block" during rotator cuff surgery. 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  ? DDD (degenerative disc disease)   ? Depression   ? denies takes paxil for migraines  ? Diabetes mellitus   ? type 2  ? Diabetic peripheral neuropathy ([place])   ? Diverticulitis   ? Diverticulosis   ? DJD (degenerative joint disease)   ? GERD (gastroesophageal reflux disease)   ? History of colon polyps   ? hyperplastic  ? History of gastric polyp   ? Hyperlipidemia   ? Hypertension   ? Iron deficiency anemia   ? Obesity   ? OSA on CPAP   ? cpap  ? Pneumonia   ? april 2020  mild  ? Primary localized osteoarthritis of left knee 03/27/2019  ? Sensorineural hearing loss   ? 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 ? ?Past Surgical History:  ?Procedure Laterality Date  ? ANKLE SURGERY    ? Left   ? COLONOSCOPY  05/2018  ? LEEP N/A 09/14/2018  ? Procedure: LOOP ELECTROSURGICAL EXCISION PROCEDURE (LEEP);  Surgeon: Arvella Nigh, MD;  Location: [facility];  Service: Gynecology;  Laterality: N/A;  ? MASS EXCISION  Right 06/24/2021  ? Procedure: EXCISION RIGHT POSTERIOR NECK MASS;  Surgeon: Kinsinger, [name], MD;  [facility];  Service: General;  Laterality: Right;  ? ROTATOR CUFF REPAIR Bilateral 2012, 2015  ? TONSILLECTOMY    ? TOTAL KNEE ARTHROPLASTY Left 04/08/2019  ? Procedure: TOTAL KNEE ARTHROPLASTY;  Surgeon: Elsie Saas, MD;  [facility];  Service: Orthopedics;  Laterality: Left;  ? UPPER GI ENDOSCOPY  05/2018  ? ? ?There were no vitals filed for this visit. ? ? Subjective Assessment - 12/08/21 1706   ? ? Subjective I feel like I'm getting better, I bought a ball to do some of those exercises that guy gave me but I didn't try it yet I only imagined I did. I was off work today. I have a hard time standing for a long time but I've always had trouble with this.   ? Pertinent History type 2 DM, moderate AI, carotid artery stenosis, liver cirrhosis, chronic lymphocytic lymphoma   ? Patient Stated Goals Get back to stationary bike and walk, would like to get back to swim, get back to her old exercise routine.   ? Currently in Pain? No/denies   ? ?  ?  ? ?  ? ? ? ? ?  [facility] PT Assessment - 12/08/21 0001   ? ?  ? Assessment  ? Medical Diagnosis calf injury   ? Referring Provider (PT) Murphy-Wainer   ?  ? Balance Screen  ? Has the patient fallen in the past 6 months Yes   ? How many times? 1   ? Has the patient had a decrease in activity level because of a fear of falling?  No   ? Is the patient reluctant to leave their home because of a fear of falling?  No   ?  ? Home Environment  ? Living Environment Private residence   ? Living Arrangements Other relatives   ? Available Help at Discharge Family   ? Additional Comments Moving to an apartment in just over a week, which has 1 step, uses wall for support.   ?  ? Prior Function  ? Level of Independence Independent   ?  ? AROM  ? Overall AROM Comments PF ROM 62, DF 18, eversion 18, inversion 35   ?  ? Strength  ? Overall Strength Comments hip flexors and ABD 3/5 at  best, ankle strength 4/5 in general   ?  ? Palpation  ? Palpation comment spasms in gastroc/soleus at least 50% better   ? ?  ?  ? ?  ? ? ? ? ? ? ? ? ? ? ? ? ? ? ? ? [facility] Adult PT Treatment/Exercise - 12/08/21 0001   ? ?  ? Ankle Exercises: Stretches  ? Other Stretch bar stretch for calves 3x30 seconds   ?  ? Ankle Exercises: Standing  ? Other Standing Ankle Exercises PF stretch 3x30 seconds; ankle PF/DF 1x10 B on rocker board   ? Other Standing Ankle Exercises eccentric heel lower (2 up down on 1) 1x10 B   use of BUEs to help control descent  ?  ? Ankle Exercises: Seated  ? Other Seated Ankle Exercises seated eversion/invresion on rocker board 1x10 B   ? ?  ?  ? ?  ?Nustep level 4 x5 minutes BLEs only  ? ? ? ? ? ? ? ? ? PT Education - 12/08/21 1748   ? ? Education Details POC moving forward- one more session for final HEP, importance of regular physical activity and minimizing sedentary lifestyle   ? Person(s) Educated Patient   ? Methods Explanation   ? Comprehension Verbalized understanding   ? ?  ?  ? ?  ? ? ? PT Short Term Goals - 12/08/21 1717   ? ?  ? PT SHORT TERM GOAL #1  ? Title I with HEP   ? Baseline 3/29- "I'm struggling with it", maybe doing 4x/week   ? Time 4   ? Period Weeks   ? Status Partially Met   ? ?  ?  ? ?  ? ? ? ? PT Long Term Goals - 12/08/21 1717   ? ?  ? PT LONG TERM GOAL #1  ? Title I with final HEP   ? Time 8   ? Period Weeks   ? Status On-going   ?  ? PT LONG TERM GOAL #2  ? Title Patient will demosntrate RLE strength of at least 4/5 throughout all joints and all planes of movement.   ? Baseline 3/29- ankle much stronger, hip still quite weak   ? Time 8   ? Period Weeks   ? Status Partially Met   ?  ? PT LONG TERM GOAL #3  ?  Title Patient will perform 20 single limb calf raises on R with pain</=3/10   ? Baseline unable   ? Time 8   ? Period Weeks   ? Status On-going   ?  ? PT LONG TERM GOAL #4  ? Title Patient will be able to walk for at least 15 minutes with R LE pain</= 3/10   ?  Baseline 3/29-5- 8 minutes   ? Time 8   ? Period Weeks   ? Status On-going   ? ?  ?  ? ?  ? ? ? ? ? ? ? ? Plan - 12/08/21 1750   ? ? Clinical Impression Statement Janice [name] arrives today doing OK, her calf is feeling much better in general. She hasn?t really been able to keep up with her HEP too well due to a busy work schedule. Took some quick objective measures today, she is improving but one thing really holding her back is sedentary lifestyle- we did talk about this and role of sedentary behaviors/habits increasing risk for reinjury or injury of a different body part. She feels she is close to her baseline, requests to have one last session before DC to finalize HEP so she has options to use to help manage this on her own if it were to start to come back.   ? Personal Factors and Comorbidities Age;Fitness;Past/Current Experience   ? Examination-Activity Limitations Locomotion Level;Transfers;Squat;Stairs;Stand;Lift   ? Examination-Participation Restrictions Community Activity   ? Stability/Clinical Decision Making Stable/Uncomplicated   ? Clinical Decision Making Low   ? Rehab Potential Good   ? PT Frequency Other (comment)   1 last visit  ? PT Duration Other (comment)   one last visit  ? PT Treatment/Interventions ADLs/Self Care Home Management;Moist Heat;Iontophoresis 4mg /ml Dexamethasone;Ultrasound;Cryotherapy;Electrical Stimulation;Manual techniques;Balance training;Therapeutic exercise;Therapeutic activities;Functional mobility training;Stair training;Gait training;Patient/family education;Dry needling;Passive range of motion   ? PT Next Visit Plan finalize HEP, DC   ? PT [place]   ? Consulted and Agree with Plan of Care Patient   ? ?  ?  ? ?  ? ? ?Patient will benefit from skilled therapeutic intervention in order to improve the following deficits and impairments:  Abnormal gait, Decreased range of motion, Difficulty walking, Increased fascial restricitons, Pain, Decreased mobility,  Decreased strength, Postural dysfunction, Increased edema, Improper body mechanics, Impaired flexibility ? ?Visit Diagnosis: ?Pain in right ankle and joints of right foot ? ?Muscle weakness (generalized) ? ?Lo

## 2021-12-26 ENCOUNTER — Other Ambulatory Visit: Payer: Self-pay | Admitting: Student

## 2021-12-30 ENCOUNTER — Encounter: Payer: Self-pay | Admitting: Physical Therapy

## 2021-12-30 ENCOUNTER — Ambulatory Visit: Payer: HMO | Attending: Physician Assistant | Admitting: Physical Therapy

## 2021-12-30 DIAGNOSIS — M6281 Muscle weakness (generalized): Secondary | ICD-10-CM | POA: Diagnosis not present

## 2021-12-30 DIAGNOSIS — M25571 Pain in right ankle and joints of right foot: Secondary | ICD-10-CM | POA: Insufficient documentation

## 2021-12-30 DIAGNOSIS — R600 Localized edema: Secondary | ICD-10-CM | POA: Insufficient documentation

## 2021-12-30 NOTE — Therapy (Signed)
[facility] ?[place], [place], [address] ?Phone: [phone]   Fax:  [phone] ? ?Physical Therapy Treatment ? ?Patient Details  ?Name: Janice [name] ?MRN: [id number] ?Date of Birth: 07-28-1951 ?Referring Provider (PT): Murphy-Wainer ? ? ?Encounter Date: 12/30/2021 ? ?PHYSICAL THERAPY DISCHARGE SUMMARY ? ?Visits from Start of Care: 6 ? ?Current functional level related to goals / functional outcomes: ?Pleased with current level of function- DC to advanced HEP  ?  ?Remaining deficits: ?Sedentary lifestyle and associated generalized weakness  ?  ?Education / Equipment: ?See below   ? ?Patient agrees to discharge. Patient goals were partially met. Patient is being discharged due to being pleased with the current functional level. ? ? ? PT End of Session - 12/30/21 1748   ? ? Visit Number 6   ? Number of Visits 6   ? Date for PT Re-Evaluation 01/19/22   ? PT Start Time 1534   ? PT Stop Time 1612   ? PT Time Calculation (min) 38 min   ? Activity Tolerance Patient tolerated treatment well   ? Behavior During Therapy [facility] for tasks assessed/performed   ? ?  ?  ? ?  ? ? ?Past Medical History:  ?Diagnosis Date  ? Allergy   ? Asthma   ? seasonal  ? Cellulitis 2013  ? Left toe  ? Cirrhosis ([place])   ? Common migraine   ? History of  ? Complication of anesthesia   ? Per pt/had breathing problems with "block" during rotator cuff surgery. 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  ? DDD (degenerative disc disease)   ? Depression   ? denies takes paxil for migraines  ? Diabetes mellitus   ? type 2  ? Diabetic peripheral neuropathy ([place])   ? Diverticulitis   ? Diverticulosis   ? DJD (degenerative joint disease)   ? GERD (gastroesophageal reflux disease)   ? History of colon polyps   ? hyperplastic  ? History of gastric polyp   ? Hyperlipidemia   ? Hypertension   ? Iron deficiency anemia   ? Obesity   ? OSA on CPAP   ? cpap  ? Pneumonia   ? april 2020  mild  ? Primary  localized osteoarthritis of left knee 03/27/2019  ? Sensorineural hearing loss   ? 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 ? ?Past Surgical History:  ?Procedure Laterality Date  ? ANKLE SURGERY    ? Left   ? COLONOSCOPY  05/2018  ? LEEP N/A 09/14/2018  ? Procedure: LOOP ELECTROSURGICAL EXCISION PROCEDURE (LEEP);  Surgeon: Arvella Nigh, MD;  Location: [facility];  Service: Gynecology;  Laterality: N/A;  ? MASS EXCISION Right 06/24/2021  ? Procedure: EXCISION RIGHT POSTERIOR NECK MASS;  Surgeon: Kinsinger, [name], MD;  Location: [facility];  Service: General;  Laterality: Right;  ? ROTATOR CUFF REPAIR Bilateral 2012, 2015  ? TONSILLECTOMY    ? TOTAL KNEE ARTHROPLASTY Left 04/08/2019  ? Procedure: TOTAL KNEE ARTHROPLASTY;  Surgeon: Elsie Saas, MD;  Location: [facility];  Service: Orthopedics;  Laterality: Left;  ? UPPER GI ENDOSCOPY  05/2018  ? ? ?There were no vitals filed for this visit. ? ? Subjective Assessment - 12/30/21 1544   ? ? [facility] was [facility]. Haven't gotten a ball for self-calf STM yet.   ? Pertinent History type 2 DM, moderate AI, carotid artery stenosis, liver cirrhosis, chronic  lymphocytic lymphoma   ? Patient Stated Goals Get back to stationary bike and walk, would like to get back to swim, get back to her old exercise routine.   ? Currently in Pain? No/denies   ? ?  ?  ? ?  ? ? ? ? ? ? ? ? ? ? ? ? ? ? ? ? ? ? ? ? [facility] Adult PT Treatment/Exercise - 12/30/21 0001   ? ?  ? Knee/Hip Exercises: Seated  ? Clamshell with TheraBand Red   1x10  ? Other Seated Knee/Hip Exercises seated marches with red TB 1x10 B; LAQs with red TB 1x10 B   ?  ? Knee/Hip Exercises: Supine  ? Bridges Both;1 set;10 reps   ?  ? Ankle Exercises: Seated  ? Other Seated Ankle Exercises 4 way ankle x10 each each direction red TB   ? ?  ?  ? ?  ? ? ? ? ? ? ? ? ? ? PT Education - 12/30/21 1748   ? ? Education Details DC today, final HEP   ? Person(s) Educated Patient   ?  Methods Explanation   ? Comprehension Verbalized understanding   ? ?  ?  ? ?  ? ? ? PT Short Term Goals - 12/08/21 1717   ? ?  ? PT SHORT TERM GOAL #1  ? Title I with HEP   ? Baseline 3/29- "I'm struggling with it", maybe doing 4x/week   ? Time 4   ? Period Weeks   ? Status Partially Met   ? ?  ?  ? ?  ? ? ? ? PT Long Term Goals - 12/08/21 1717   ? ?  ? PT LONG TERM GOAL #1  ? Title I with final HEP   ? Time 8   ? Period Weeks   ? Status On-going   ?  ? PT LONG TERM GOAL #2  ? Title Patient will demosntrate RLE strength of at least 4/5 throughout all joints and all planes of movement.   ? Baseline 3/29- ankle much stronger, hip still quite weak   ? Time 8   ? Period Weeks   ? Status Partially Met   ?  ? PT LONG TERM GOAL #3  ? Title Patient will perform 20 single limb calf raises on R with pain</=3/10   ? Baseline unable   ? Time 8   ? Period Weeks   ? Status On-going   ?  ? PT LONG TERM GOAL #4  ? Title Patient will be able to walk for at least 15 minutes with R LE pain</= 3/10   ? Baseline 3/29-5- 8 minutes   ? Time 8   ? Period Weeks   ? Status On-going   ? ?  ?  ? ?  ? ? ? ? ? ? ? ? Plan - 12/30/21 1748   ? ? Clinical Impression Statement Janice [name] arrives today feeling OK, [facility]. We warmed up on the bike then focused on expanding HEP. Ready for DC, thank you for the referral!   ? Personal Factors and Comorbidities Age;Fitness;Past/Current Experience   ? Examination-Activity Limitations Locomotion Level;Transfers;Squat;Stairs;Stand;Lift   ? Examination-Participation Restrictions Community Activity   ? Stability/Clinical Decision Making Stable/Uncomplicated   ? Clinical Decision Making Low   ? Rehab Potential Good   ? PT Frequency Other (comment)   DC  ? PT Duration Other (comment)   DC  ?  PT Treatment/Interventions ADLs/Self Care Home Management;Moist Heat;Iontophoresis 88m/ml Dexamethasone;Ultrasound;Cryotherapy;Electrical Stimulation;Manual techniques;Balance  training;Therapeutic exercise;Therapeutic activities;Functional mobility training;Stair training;Gait training;Patient/family education;Dry needling;Passive range of motion   ? PT Next Visit Plan DC   ? PT [facility]  ? Consulted and Agree with Plan of Care Patient   ? ?  ?  ? ?  ? ? ?Patient will benefit from skilled therapeutic intervention in order to improve the following deficits and impairments:  Abnormal gait, Decreased range of motion, Difficulty walking, Increased fascial restricitons, Pain, Decreased mobility, Decreased strength, Postural dysfunction, Increased edema, Improper body mechanics, Impaired flexibility ? ?Visit Diagnosis: ?Pain in right ankle and joints of right foot ? ?Muscle weakness (generalized) ? ?Localized edema ? ? ? ? ?Problem List ?Patient Active Problem List  ? Diagnosis Date Noted  ? 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[facility] 05/06/2021  ? Upper airway cough syndrome 04/29/2021  ? Cough variant asthma 04/29/2021  ? Liver cirrhosis secondary to NASH (nonalcoholic steatohepatitis) ([facility] 08/21/2020  ? Abdominal obesity and metabolic syndrome 134/11/5246 ? Class 2 severe obesity with serious comorbidity and body mass index (BMI) of 37.0 to 37.9 in adult ([facility] 08/21/2020  ? Thrombocytopenia ([facility] 08/21/2020  ? Moderate aortic regurgitation 03/09/2020  ? Asymptomatic stenosis of left carotid artery 03/09/2020  ? Primary localized osteoarthritis of left knee 03/27/2019  ? DDD (degenerative disc disease), lumbar   ? Obstructive sleep apnea treated with continuous positive airway pressure (CPAP) 08/27/2018  ? Asthma with acute exacerbation 12/07/2013  ? Diverticulitis 04/23/2012  ? Essential hypertension 04/23/2012  ? Controlled type 2 diabetes mellitus without complication, with long-term current use of insulin ([facility] 04/23/2012  ? Colon polyps 04/23/2012  ? ?March Steyer U PT, DPT, PN2  ? ?Supplemental Physical Therapist ?[facility] ? ? ? ? ? ?[place] [address] ?[place] [address]?Phone: [phone]  Fax:  [phone]? ?Name: Janice [name] ?MRN: [id number]?Date of Birth: 604-25-1952? ? ? ?

## 2022-01-04 ENCOUNTER — Other Ambulatory Visit: Payer: Self-pay

## 2022-01-04 ENCOUNTER — Inpatient Hospital Stay: Payer: HMO | Admitting: Hematology

## 2022-01-04 ENCOUNTER — Inpatient Hospital Stay: Payer: HMO | Attending: Hematology

## 2022-01-04 VITALS — BP 122/47 | HR 71 | Temp 98.1°F | Resp 20 | Wt 219.4 lb

## 2022-01-04 DIAGNOSIS — M1990 Unspecified osteoarthritis, unspecified site: Secondary | ICD-10-CM | POA: Insufficient documentation

## 2022-01-04 DIAGNOSIS — Z96652 Presence of left artificial knee joint: Secondary | ICD-10-CM | POA: Diagnosis not present

## 2022-01-04 DIAGNOSIS — Z803 Family history of malignant neoplasm of breast: Secondary | ICD-10-CM | POA: Insufficient documentation

## 2022-01-04 DIAGNOSIS — K7460 Unspecified cirrhosis of liver: Secondary | ICD-10-CM | POA: Insufficient documentation

## 2022-01-04 DIAGNOSIS — D509 Iron deficiency anemia, unspecified: Secondary | ICD-10-CM | POA: Insufficient documentation

## 2022-01-04 DIAGNOSIS — D696 Thrombocytopenia, unspecified: Secondary | ICD-10-CM | POA: Insufficient documentation

## 2022-01-04 DIAGNOSIS — I10 Essential (primary) hypertension: Secondary | ICD-10-CM | POA: Insufficient documentation

## 2022-01-04 DIAGNOSIS — E785 Hyperlipidemia, unspecified: Secondary | ICD-10-CM | POA: Diagnosis not present

## 2022-01-04 DIAGNOSIS — E118 Type 2 diabetes mellitus with unspecified complications: Secondary | ICD-10-CM | POA: Insufficient documentation

## 2022-01-04 DIAGNOSIS — C9110 Chronic lymphocytic leukemia of B-cell type not having achieved remission: Secondary | ICD-10-CM | POA: Diagnosis not present

## 2022-01-04 LAB — CBC WITH DIFFERENTIAL/PLATELET
Abs Immature Granulocytes: 0.09 10*3/uL — ABNORMAL HIGH (ref 0.00–0.07)
Basophils Absolute: 0.1 10*3/uL (ref 0.0–0.1)
Basophils Relative: 1 %
Eosinophils Absolute: 0.2 10*3/uL (ref 0.0–0.5)
Eosinophils Relative: 1 %
HCT: 33.2 % — ABNORMAL LOW (ref 36.0–46.0)
Hemoglobin: 9.7 g/dL — ABNORMAL LOW (ref 12.0–15.0)
Immature Granulocytes: 0 %
Lymphocytes Relative: 72 %
Lymphs Abs: 15.6 10*3/uL — ABNORMAL HIGH (ref 0.7–4.0)
MCH: 24.7 pg — ABNORMAL LOW (ref 26.0–34.0)
MCHC: 29.2 g/dL — ABNORMAL LOW (ref 30.0–36.0)
MCV: 84.5 fL (ref 80.0–100.0)
Monocytes Absolute: 0.8 10*3/uL (ref 0.1–1.0)
Monocytes Relative: 4 %
Neutro Abs: 4.6 10*3/uL (ref 1.7–7.7)
Neutrophils Relative %: 22 %
Platelets: 106 10*3/uL — ABNORMAL LOW (ref 150–400)
RBC: 3.93 MIL/uL (ref 3.87–5.11)
RDW: 16 % — ABNORMAL HIGH (ref 11.5–15.5)
WBC: 21.4 10*3/uL — ABNORMAL HIGH (ref 4.0–10.5)
nRBC: 0.1 % (ref 0.0–0.2)

## 2022-01-04 LAB — CMP (CANCER CENTER ONLY)
ALT: 17 U/L (ref 0–44)
AST: 15 U/L (ref 15–41)
Albumin: 4.4 g/dL (ref 3.5–5.0)
Alkaline Phosphatase: 66 U/L (ref 38–126)
Anion gap: 4 — ABNORMAL LOW (ref 5–15)
BUN: 14 mg/dL (ref 8–23)
CO2: 28 mmol/L (ref 22–32)
Calcium: 9.3 mg/dL (ref 8.9–10.3)
Chloride: 107 mmol/L (ref 98–111)
Creatinine: 0.81 mg/dL (ref 0.44–1.00)
GFR, Estimated: >60 mL/min (ref >60)
Glucose, Bld: 176 mg/dL — ABNORMAL HIGH (ref 70–99)
Potassium: 4.8 mmol/L (ref 3.5–5.1)
Sodium: 139 mmol/L (ref 135–145)
Total Bilirubin: 0.5 mg/dL (ref 0.3–1.2)
Total Protein: 6.4 g/dL — ABNORMAL LOW (ref 6.5–8.1)

## 2022-01-04 LAB — LACTATE DEHYDROGENASE: LDH: 183 U/L (ref 98–192)

## 2022-01-04 NOTE — Progress Notes (Signed)
? ? ?HEMATOLOGY/ONCOLOGY CLINIC NOTE ? ?Date of Service: 01/04/2022 ? ? ?Patient Care Team: ?Prince Solian, MD as PCP - General (Internal Medicine) ? ?CHIEF COMPLAINTS/PURPOSE OF CONSULTATION:  ?F/u for evaluation and consultation for CLL/SLL ? ?HISTORY OF PRESENTING ILLNESS:  ?Plz see previous notes for details on initial presentation. ? ?INTERVAL HISTORY: ? ?Janice [name] is a 71 y.o. female here for f/u for evaluation and consultation for her CLL/SLL>. She reports She is doing well with no new symptoms or concerns. ? ?We discussed repeat MRI imaging with Dr. Carlean Purl in 3 to 4 months again per previous recommendation which she was agreeable to. ? ?She reports that she will feel fine and then get fatigued. She notes it is an intermittent feeling. ? ?She notes easy bruisability in her arms. ? ?We discussed taking a B complex which she was agreeable to. ? ?She notes intermittent tenderness in left upper abdominal area. ? ?No fever, chills, night sweats. ?No new lumps, bumps, or lesions/rashes. ?No abdominal pain or change in bowel habits. ?No new or unexpected weight loss. ?No SOB or chest pain. ?No bleeding issues. No blood in stools. ?No other new or acute focal symptoms. ? ?Discussed pt labwork today, 01/04/2022. CBC shows increased WBC count of 21.4k, hemoglobin of 9.7, platelets of 106k. ?CMP within normal limits other than glucose level of 176. ?LDH 183. ? ?MEDICAL HISTORY:  ?Past Medical History:  ?Diagnosis Date  ? Allergy   ? Asthma   ? seasonal  ? Cellulitis 2013  ? Left toe  ? Cirrhosis ([place])   ? Common migraine   ? History of  ? Complication of anesthesia   ? Per pt/had breathing problems with "block" during rotator cuff surgery. 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  ? DDD (degenerative disc disease)   ? Depression   ? denies takes paxil for migraines  ? Diabetes mellitus   ? type 2  ? Diabetic peripheral neuropathy ([place])   ? Diverticulitis   ? Diverticulosis   ? DJD (degenerative joint disease)    ? GERD (gastroesophageal reflux disease)   ? History of colon polyps   ? hyperplastic  ? History of gastric polyp   ? Hyperlipidemia   ? Hypertension   ? Iron deficiency anemia   ? Obesity   ? OSA on CPAP   ? cpap  ? Pneumonia   ? april 2020  mild  ? Primary localized osteoarthritis of left knee 03/27/2019  ? Sensorineural hearing loss   ? 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 ? ?SURGICAL HISTORY: ?Past Surgical History:  ?Procedure Laterality Date  ? ANKLE SURGERY    ? Left   ? COLONOSCOPY  05/2018  ? LEEP N/A 09/14/2018  ? Procedure: LOOP ELECTROSURGICAL EXCISION PROCEDURE (LEEP);  Surgeon: Arvella Nigh, MD;  Location: [facility];  Service: Gynecology;  Laterality: N/A;  ? MASS EXCISION Right 06/24/2021  ? Procedure: EXCISION RIGHT POSTERIOR NECK MASS;  Surgeon: Kinsinger, [name], MD;  Location: [facility];  Service: General;  Laterality: Right;  ? ROTATOR CUFF REPAIR Bilateral 2012, 2015  ? TONSILLECTOMY    ? TOTAL KNEE ARTHROPLASTY Left 04/08/2019  ? Procedure: TOTAL KNEE ARTHROPLASTY;  Surgeon: Elsie Saas, MD;  Location: [facility];  Service: Orthopedics;  Laterality: Left;  ? UPPER GI ENDOSCOPY  05/2018  ? ? ?SOCIAL HISTORY: ?Social History  ? ?Socioeconomic History  ? Marital status: Widowed  ?  Spouse name: Not on file  ? Number of children: 0  ?  Years of education: 3 years college  ? Highest education level: Not on file  ?Occupational History  ? Occupation: Unemployed  ?Tobacco Use  ? Smoking status: Never  ? Smokeless tobacco: Never  ?Vaping Use  ? Vaping Use: Never used  ?Substance and Sexual Activity  ? Alcohol use: Not Currently  ?  Comment: very rare  ? Drug use: No  ? Sexual activity: Not Currently  ?  Birth control/protection: None, Post-menopausal  ?Other Topics Concern  ? Not on file  ?Social History Narrative  ? 1 caffeine drink daily   ? Right-handed  ? Lives at home with husband.  ? ?Social Determinants of Health  ? ?Financial Resource Strain: Not on file  ?Food Insecurity: Not on  file  ?Transportation Needs: Not on file  ?Physical Activity: Not on file  ?Stress: Not on file  ?Social Connections: Not on file  ?Intimate Partner Violence: Not on file  ? ? ?FAMILY HISTORY: ?Family History  ?Problem Relation Age of Onset  ? Breast cancer Mother   ? Bone cancer Mother   ? Heart failure Father   ? Diabetes Mellitus II Father   ? Hypertension Father   ? Diabetes Mellitus II Sister   ? Obesity Sister   ? Other Sister   ?     retina problem  ? Hypertension Brother   ? Diabetes Mellitus II Brother   ? Colon cancer Neg Hx   ? Rectal cancer Neg Hx   ? Stomach cancer Neg Hx   ? Esophageal cancer Neg Hx   ? Pancreatic cancer Neg Hx   ? Liver disease Neg Hx   ? ? ?ALLERGIES:  is allergic to benzonatate, fluticasone furoate-vilanterol, iodine, latex, simvastatin, bupropion, levofloxacin, penicillins, povidone-iodine, amoxicillin, chlorthalidone, diflucan [fluconazole], povidone iodine, amoxicillin-pot clavulanate, and doxycycline. ? ?MEDICATIONS:  ?Current Outpatient Medications  ?Medication Sig Dispense Refill  ? albuterol (PROVENTIL HFA;VENTOLIN HFA) 108 (90 Base) MCG/ACT inhaler Inhale 1-2 puffs into the lungs every 6 (six) hours as needed (Cough).    ? amLODipine (NORVASC) 10 MG tablet Take 10 mg by mouth daily.    ? aspirin EC 81 MG tablet Take 1 tablet (81 mg total) by mouth daily. Swallow whole. 90 tablet 3  ? azelastine (ASTELIN) 0.1 % nasal spray Place 1 spray into both nostrils 2 (two) times daily. Use in each nostril as directed 90 mL 3  ? Calcium-Magnesium-Zinc (CAL-MAG-ZINC PO) Take 2 tablets by mouth daily.    ? Carboxymethylcellul-Glycerin (REFRESH OPTIVE PF OP) Place 1 drop into both eyes daily as needed (dry eyes). Non-pres    ? Coenzyme Q10 (CO Q-10) 100 MG CAPS Take 100 mg by mouth daily.    ? desoximetasone (TOPICORT) 0.25 % cream Apply 1 application topically 4 (four) times daily as needed (rash).    ? diclofenac sodium (VOLTAREN) 1 % GEL Apply 2 g topically 2 (two) times daily as needed  (knee pain/ foot /Triger finger).  2  ? fluticasone (FLONASE) 50 MCG/ACT nasal spray Place 1 spray into both nostrils daily.    ? gabapentin (NEURONTIN) 300 MG capsule 600 mg daily.    ? hydrALAZINE (APRESOLINE) 25 MG tablet Take 1 tablet by mouth twice daily 60 tablet 0  ? insulin NPH Human (HUMULIN N,NOVOLIN N) 100 UNIT/ML injection Inject 40-75 Units into the skin See admin instructions. Inject 45 units subcutaneously at breakfast, 30 units at lunch & 45 units at supper    ? insulin regular (NOVOLIN R,HUMULIN R) 100 units/mL injection Inject  30 Units into the skin 3 (three) times daily before meals.    ? irbesartan (AVAPRO) 300 MG tablet Take 300 mg by mouth daily.    ? metFORMIN (GLUCOPHAGE-XR) 500 MG 24 hr tablet Take 1,000 mg by mouth 2 (two) times daily.    ? omeprazole (PRILOSEC) 40 MG capsule Take 40 mg by mouth 2 (two) times daily.    ? PARoxetine (PAXIL) 10 MG tablet Take 10 mg by mouth daily.    ? rosuvastatin (CRESTOR) 10 MG tablet Take 1 tablet (10 mg total) by mouth daily. (Patient taking differently: Take 10 mg by mouth daily with supper.) 90 tablet 3  ? spironolactone (ALDACTONE) 50 MG tablet Take 1 tablet by mouth once daily 90 tablet 3  ? terconazole (TERAZOL 3) 0.8 % vaginal cream Place 1 applicator vaginally as needed (yeast infections).     ? vitamin C (ASCORBIC ACID) 500 MG tablet Take 500 mg by mouth 2 (two) times a day.    ? Wheat Dextrin (BENEFIBER PO) Take by mouth. Take 3/4 tsp daily    ? ?No current facility-administered medications for this visit.  ? ? ?REVIEW OF SYSTEMS:   ?.10 Point review of Systems was done is negative except as noted above. ? ? ? ?PHYSICAL EXAMINATION: ?ECOG PERFORMANCE STATUS: 1 - Symptomatic but completely ambulatory  ?NAD ?GENERAL:alert, in no acute distress and comfortable ?SKIN: no acute rashes, no significant lesions ?EYES: conjunctiva are pink and non-injected, sclera anicteric ?NECK: supple, no JVD ?LYMPH:  no palpable lymphadenopathy in the cervical,  axillary or inguinal regions ?LUNGS: clear to auscultation b/l with normal respiratory effort ?HEART: regular rate & rhythm ?ABDOMEN:  normoactive bowel sounds, not distended. Tenderness palpable in left

## 2022-01-05 ENCOUNTER — Telehealth: Payer: Self-pay | Admitting: Internal Medicine

## 2022-01-05 NOTE — Telephone Encounter (Signed)
Patient called stating she was supposed to call back and see if Dr. Carlean Purl had made a decision about her having an MRI.  Please call patient and advise.  Thank you. ?

## 2022-01-06 NOTE — Telephone Encounter (Signed)
Left message for pt to call back  °

## 2022-01-07 NOTE — Telephone Encounter (Signed)
Left message for pt to call back  °

## 2022-01-07 NOTE — Telephone Encounter (Signed)
Patient returned your phone call.  Please call her back on Monday.  Thank you. ?

## 2022-01-10 NOTE — Telephone Encounter (Signed)
Left message for pt to call back  °

## 2022-01-11 NOTE — Telephone Encounter (Signed)
Left message to call back  

## 2022-01-11 NOTE — Telephone Encounter (Signed)
Pt called in stating that she is ready to proceed with MRI:  ?Requesting that the MRI be scheduled at [facility]  due to the fact that they have a large MRI machine ?Pt also requesting medication for Claustrophobia:   ?Please advise  ?

## 2022-01-12 DIAGNOSIS — L720 Epidermal cyst: Secondary | ICD-10-CM | POA: Diagnosis not present

## 2022-01-12 DIAGNOSIS — D2239 Melanocytic nevi of other parts of face: Secondary | ICD-10-CM | POA: Diagnosis not present

## 2022-01-12 DIAGNOSIS — D171 Benign lipomatous neoplasm of skin and subcutaneous tissue of trunk: Secondary | ICD-10-CM | POA: Diagnosis not present

## 2022-01-12 DIAGNOSIS — D485 Neoplasm of uncertain behavior of skin: Secondary | ICD-10-CM | POA: Diagnosis not present

## 2022-01-13 MED ORDER — LORAZEPAM 1 MG PO TABS: ORAL_TABLET | ORAL | 0 refills | Status: DC

## 2022-01-13 NOTE — Telephone Encounter (Signed)
Okay please schedule MR abdomen with and without contrast ? ?Schedule at [facility] imaging as she mentions, ask for open or larger MRI.  You may need to talk to them because the open MRI might not be appropriate to evaluate the liver as needed ? ?Diagnosis is: Cirrhosis and liver lesion (in description where they asked you can say to follow-up abnormal liver on 07/01/2021 MRI) ? ?I sent in a lorazepam prescription ?

## 2022-01-14 ENCOUNTER — Other Ambulatory Visit: Payer: Self-pay

## 2022-01-14 DIAGNOSIS — K769 Liver disease, unspecified: Secondary | ICD-10-CM

## 2022-01-14 DIAGNOSIS — K7460 Unspecified cirrhosis of liver: Secondary | ICD-10-CM

## 2022-01-14 NOTE — Telephone Encounter (Signed)
MR abdomen with and without contrast ordered for [facility] imaging: They will contact pt in regard to scheduling: ?Left message for pt to call back:  ? ?

## 2022-01-17 NOTE — Telephone Encounter (Signed)
Pt was made aware of Dr.Gessner recommendations: Order was placed in Epic Pt was made aware that [facility] will contact her: Pt notified to call our office by the end of the week if she has not heard anything from them: ?Pt was made aware of lorazepam prescription: ?Pt verbalized understanding with all questions answered.  ? ?

## 2022-01-17 NOTE — Telephone Encounter (Signed)
Left message for pt to call back  °

## 2022-01-18 ENCOUNTER — Telehealth (INDEPENDENT_AMBULATORY_CARE_PROVIDER_SITE_OTHER): Payer: HMO | Admitting: Family Medicine

## 2022-01-18 ENCOUNTER — Encounter: Payer: Self-pay | Admitting: Family Medicine

## 2022-01-18 DIAGNOSIS — G4733 Obstructive sleep apnea (adult) (pediatric): Secondary | ICD-10-CM | POA: Diagnosis not present

## 2022-01-18 DIAGNOSIS — R188 Other ascites: Secondary | ICD-10-CM | POA: Diagnosis not present

## 2022-01-18 DIAGNOSIS — E1140 Type 2 diabetes mellitus with diabetic neuropathy, unspecified: Secondary | ICD-10-CM | POA: Diagnosis not present

## 2022-01-18 DIAGNOSIS — R0600 Dyspnea, unspecified: Secondary | ICD-10-CM | POA: Diagnosis not present

## 2022-01-18 DIAGNOSIS — I10 Essential (primary) hypertension: Secondary | ICD-10-CM | POA: Diagnosis not present

## 2022-01-18 DIAGNOSIS — K7460 Unspecified cirrhosis of liver: Secondary | ICD-10-CM | POA: Diagnosis not present

## 2022-01-18 DIAGNOSIS — G629 Polyneuropathy, unspecified: Secondary | ICD-10-CM | POA: Diagnosis not present

## 2022-01-18 DIAGNOSIS — Z9989 Dependence on other enabling machines and devices: Secondary | ICD-10-CM

## 2022-01-18 DIAGNOSIS — F329 Major depressive disorder, single episode, unspecified: Secondary | ICD-10-CM | POA: Diagnosis not present

## 2022-01-18 DIAGNOSIS — Z1389 Encounter for screening for other disorder: Secondary | ICD-10-CM | POA: Diagnosis not present

## 2022-01-18 DIAGNOSIS — K760 Fatty (change of) liver, not elsewhere classified: Secondary | ICD-10-CM | POA: Diagnosis not present

## 2022-01-18 NOTE — Progress Notes (Signed)
? ?PATIENT: Janice [name] ?DOB: [birth year] ? ?REASON FOR VISIT: follow up ?HISTORY FROM: patient ? ?Virtual Visit via Telephone Note ? ?I connected with Janice [name] on 01/18/22 at  9:00 AM EDT by telephone and verified that I am speaking with the correct person using two identifiers. ?  ?I discussed the limitations, risks, security and privacy concerns of performing an evaluation and management service by telephone and the availability of in person appointments. I also discussed with the patient that there may be a patient responsible charge related to this service. The patient expressed understanding and agreed to proceed. ? ? ?History of Present Illness: ? ?01/18/22 ALL ([place]): ?Janice [name] returns for CPAP follow up. She is doing well on therapy. She is using CPAP nightly for about 7 hours. She denies concerns with machine. Her machine is approaching 71 years of age but works well. She does report mask rubs her nose and can create a sore at times. She uses a circular band aid that helps. She is followed closely by care team.  ? ? ? ?01/12/2021 ALL ([place]): ?Janice [name] returns for follow up for OSA on CPAP. She continues to do very well with therapy. She lost her husband this year and has recently been diagnosed with non hodgkin's lymphoma. She has a lymph node on her scalp that is being looked at by oncology this week. She does have some difficulty with her headgear rubbing that area. She has not slept as soundly since losing her husband. She is followed closely by PCP and has a great support system.  ? ? ? ? ?01/07/2020 ALL:  ?Janice [name] is a 71 y.o. female here today for follow up for OSA on CPAP. She is doing well. She is complaint with daily usage.  Compliance report dated 12/07/2019 through 01/05/2020 reveals that she is used CPAP every day for compliance of 100%.  She used CPAP greater than 4 hours every day for compliance of 100%.  Average usage was 7 hours and 28 minutes.  Residual AHI was 0.4 on  5 to 20 cm of water and an EPR of 3.  There was no significant leak noted. ? ?History (copied from [employer] note on 08/27/2018)  ? ?UPDATE 12/16/2019CM Janice [name], 71 year old female returns for follow-up with a history of obstructive sleep apnea here for CPAP compliance.  She has had no problems with her machine. Compliance data dated 07/28/2018-08/26/2018 shows compliance greater than 4 hours at 100%.  Average usage 7 hours 34 minutes.  Set pressure 5 to 20 cm.  EPR level 3 leak 95th percentile 0.3.  AHI 0.6 ESS 0 she returns for reevaluation ? ?Janice [name] is a 71 y.o. female , seen here in a RV: ?05-25-2017, the patient was issued a new CPAP after a HST confirmed the presence of OSA: She is a compliant user now, but she experienced bronchitis, Pneumonia and constant non- productive cough at night. Spasms of coughing, after 2 antibiotic rounds.  ?She has been prescribed a narcotic cough syrup with codeine and tried to discontinue this medication but couldn't. Just coughing too much still at night to get restful sleep, just last night took some cough syrup to allow her to sleep. She reports that the CPAP is not pleasant and comfortable to wear while she has sees respiratory tract infections which may partially be viral and partially be allergic in origin. I would like for her to start to take a daily Claritin or Allegra, nondrowsy making to at  least eliminate the possibility of allergies playing a role. She gets choked during the day, too. No sleep related, not even food intake related. Epiglottis spasm?  ?I will give her the medical leave from CPAP use for the next 7 days until she has completely recovered. She should then start CPAP again. ? ? ?Observations/Objective: ? ?Generalized: Well developed, in no acute distress  ?Mentation: Alert oriented to time, place, history taking. Follows all commands speech and language fluent ? ? ?Assessment and Plan: ? ?71 y.o. 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 here with ? ?  ICD-10-CM   ?1. Obstructive sleep apnea treated with continuous positive airway pressure (CPAP)  G47.33 For home use only DME continuous positive airway pressure (CPAP)  ? Z99.89   ?  ? ?  ? ?She is doing very well with CPAP therapy.  Compliance report reveals excellent compliance.  I have encouraged her to continue using CPAP nightly and for greater than 4 hours each night. She will keep a close eye on her mask. May reach out to DME for mask refitting if needed. We will update supply orders. May consider repeat HST for new CPAP machine when eligible (05/2022). She will continue close follow up with PCP. She was encouraged to work on healthy lifestyle habits with well-balanced diet and regular exercise.  She will follow-up with [place] in 1 year, sooner if needed.  She verbalizes understanding and agreement with this plan. ? ? ?Orders Placed This Encounter  ?Procedures  ? For home use only DME continuous positive airway pressure (CPAP)  ?  Supplies  ?  Order Specific Question:   Length of Need  ?  Answer:   Lifetime  ?  Order Specific Question:   Patient has OSA or probable OSA  ?  Answer:   Yes  ?  Order Specific Question:   Is the patient currently using CPAP in the home  ?  Answer:   Yes  ?  Order Specific Question:   Settings  ?  Answer:   Other see comments  ?  Order Specific Question:   CPAP supplies needed  ?  Answer:    Mask, headgear, cushions, filters, heated tubing and water chamber  ? ? ?No orders of the defined types were placed in this encounter. ? ? ? ?Follow Up Instructions: ? ?I discussed the assessment and treatment plan with the patient. The patient was provided an opportunity to ask questions and all were answered. The patient agreed with the plan and demonstrated an understanding of the instructions. ?  ?The patient was advised to call back or seek an in-person evaluation if the symptoms worsen or if the condition fails to improve as anticipated. ? ?I provided 15 minutes of non-face-to-face time during this encounter.  Patient is located at her place of residence during my chart visit.  Provider is in the office. ? ? ?Debbora Presto, NP  ? ?

## 2022-01-18 NOTE — Patient Instructions (Signed)

## 2022-01-19 NOTE — Progress Notes (Signed)
CM sent to [facility] for new order ?

## 2022-02-01 ENCOUNTER — Ambulatory Visit: Payer: HMO

## 2022-02-01 DIAGNOSIS — I6523 Occlusion and stenosis of bilateral carotid arteries: Secondary | ICD-10-CM | POA: Diagnosis not present

## 2022-02-01 DIAGNOSIS — R0989 Other specified symptoms and signs involving the circulatory and respiratory systems: Secondary | ICD-10-CM | POA: Diagnosis not present

## 2022-02-01 DIAGNOSIS — I6522 Occlusion and stenosis of left carotid artery: Secondary | ICD-10-CM

## 2022-02-02 NOTE — Progress Notes (Signed)
You can order f/u duplex

## 2022-02-03 ENCOUNTER — Other Ambulatory Visit: Payer: Self-pay | Admitting: Student

## 2022-02-03 DIAGNOSIS — I6522 Occlusion and stenosis of left carotid artery: Secondary | ICD-10-CM

## 2022-02-04 ENCOUNTER — Ambulatory Visit: Payer: HMO

## 2022-02-04 ENCOUNTER — Ambulatory Visit
Admission: RE | Admit: 2022-02-04 | Discharge: 2022-02-04 | Disposition: A | Discharge status: Home / Self Care | Payer: HMO | Source: Ambulatory Visit | Attending: Internal Medicine | Admitting: Internal Medicine

## 2022-02-04 DIAGNOSIS — K769 Liver disease, unspecified: Secondary | ICD-10-CM

## 2022-02-04 DIAGNOSIS — K7460 Unspecified cirrhosis of liver: Secondary | ICD-10-CM

## 2022-02-04 MED ORDER — GADOBENATE DIMEGLUMINE 529 MG/ML IV SOLN
19.0000 mL | Freq: Once | INTRAVENOUS | Status: AC | PRN
  Administered 2022-02-04: 19 mL via INTRAVENOUS

## 2022-02-14 ENCOUNTER — Other Ambulatory Visit: Payer: Self-pay | Admitting: Student

## 2022-02-17 ENCOUNTER — Encounter: Payer: Self-pay | Admitting: Student

## 2022-02-17 ENCOUNTER — Ambulatory Visit: Payer: HMO | Admitting: Student

## 2022-02-17 VITALS — BP 137/70 | HR 79 | Temp 98.6°F | Resp 16 | Ht 64.5 in | Wt 215.0 lb

## 2022-02-17 DIAGNOSIS — I6522 Occlusion and stenosis of left carotid artery: Secondary | ICD-10-CM | POA: Diagnosis not present

## 2022-02-17 DIAGNOSIS — R0609 Other forms of dyspnea: Secondary | ICD-10-CM | POA: Diagnosis not present

## 2022-02-17 NOTE — Progress Notes (Signed)
Primary Physician/Referring:  Chilton Greathouse, MD  Patient ID: Janice [name], female    DOB: 12-18-1950, 71 y.o.   MRN: [id number]  Chief Complaint  Patient presents with   Shortness of Breath   Follow-up    3 months   HPI:    Janice [name]  is a 71 y.o. female with hypertension, type 2 DM, moderate AI, carotid artery stenosis, liver cirrhosis, chronic lymphocytic lymphoma. History of significant anemia, which lead to discontinuation of aspirin. Patient reported intolerance to high dose Crestor in the past.   Patient was last seen in the office 11/15/2021 at which time echocardiogram and stress test were reassuring, therefore advised patient to focus on increasing physical activity and weight loss, no changes were made.  Patient now presents for 16-month follow-up. Patient continues to have ongoing anemia, most recent Hgb 9.7. Patient continues to complain of fatigue and dyspnea on exertion, although dyspnea has improved since last visit. Patient was having occasional palpitations however she pendantly reduce hydralazine to 12.5 mg in the morning and 25 mg in the afternoon, plan palpitations have since resolved.  Blood pressure remains well controlled.  Denies chest pain, orthopnea, leg edema.  Presently riding a stationary bike at the [facility] without issue regularly.  Past Medical History:  Diagnosis Date   Allergy    Asthma    seasonal   Cellulitis 2013   Left toe   Cirrhosis (HCC)    Common migraine    History of   Complication of anesthesia    Per pt/had breathing problems with "block" during rotator cuff surgery. 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   DDD (degenerative disc disease)    Depression    denies takes paxil for migraines   Diabetes mellitus    type 2   Diabetic peripheral neuropathy (HCC)    Diverticulitis    Diverticulosis    DJD (degenerative joint disease)    GERD (gastroesophageal reflux disease)    History of colon polyps    hyperplastic    History of gastric polyp    Hyperlipidemia    Hypertension    Iron deficiency anemia    Obesity    OSA on CPAP    cpap   Pneumonia    april 2020  mild   Primary localized osteoarthritis of left knee 03/27/2019   Sensorineural hearing loss    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HCC)    Past Surgical History:  Procedure Laterality Date   ANKLE SURGERY     Left    COLONOSCOPY  05/2018   LEEP N/A 09/14/2018   Procedure: LOOP ELECTROSURGICAL EXCISION PROCEDURE (LEEP);  Surgeon: Richardean Chimera, MD;  Location: [facility];  Service: Gynecology;  Laterality: N/A;   MASS EXCISION Right 06/24/2021   Procedure: EXCISION RIGHT POSTERIOR NECK MASS;  Surgeon: Kinsinger, De Blanch, MD;  Location: [facility];  Service: General;  Laterality: Right;   ROTATOR CUFF REPAIR Bilateral 2012, 2015   TONSILLECTOMY     TOTAL KNEE ARTHROPLASTY Left 04/08/2019   Procedure: TOTAL KNEE ARTHROPLASTY;  Surgeon: Salvatore Marvel, MD;  Location: [facility];  Service: Orthopedics;  Laterality: Left;   UPPER GI ENDOSCOPY  05/2018   Family History  Problem Relation Age of Onset   Breast cancer Mother    Bone cancer Mother    Heart failure Father    Diabetes Mellitus II Father    Hypertension Father    Diabetes Mellitus II Sister    Obesity Sister    Other  Sister        retina problem   Hypertension Brother    Diabetes Mellitus II Brother    Colon cancer Neg Hx    Rectal cancer Neg Hx    Stomach cancer Neg Hx    Esophageal cancer Neg Hx    Pancreatic cancer Neg Hx    Liver disease Neg Hx     Social History   Tobacco Use   Smoking status: Never   Smokeless tobacco: Never  Substance Use Topics   Alcohol use: Not Currently    Comment: very rare   Marital Status: Widowed   ROS  Review of Systems  Cardiovascular:  Positive for dyspnea on exertion (improved). Negative for chest pain, claudication, leg swelling, near-syncope, orthopnea, palpitations, paroxysmal nocturnal dyspnea and syncope.  Neurological:   Negative for dizziness.   Objective  Blood pressure 137/70, pulse 79, temperature 98.6 F (37 C), temperature source Temporal, resp. rate 16, height 5' 4.5" (1.638 m), weight 215 lb (97.5 kg), SpO2 96 %.     02/17/2022    9:34 AM 01/04/2022   10:08 AM 11/15/2021    8:42 AM  Vitals with BMI  Height 5' 4.5"  5' 4.5"  Weight 215 lbs 219 lbs 6 oz 220 lbs 6 oz  BMI 34.28  76.81  Systolic 157 262 035  Diastolic 70 47 63  Pulse 79 71 85      Physical Exam Vitals reviewed.  Constitutional:      Appearance: She is obese.  Cardiovascular:     Rate and Rhythm: Normal rate and regular rhythm.     Pulses: Intact distal pulses.          Carotid pulses are  on the left side with bruit.    Heart sounds: S1 normal and S2 normal. No murmur heard.    No gallop.  Pulmonary:     Effort: Pulmonary effort is normal. No respiratory distress.     Breath sounds: No wheezing, rhonchi or rales.  Musculoskeletal:     Right lower leg: No edema.     Left lower leg: No edema.  Neurological:     Mental Status: She is alert.   Physical exam unchanged compared to previous office visit.  Laboratory examination:   Recent Labs    06/07/21 1149 07/06/21 1424 08/24/21 0940 10/18/21 1222 01/04/22 0935  NA 138   < > 137 137 139  K 4.9   < > 4.6 4.9 4.8  CL 105   < > 105 102 107  CO2 27   < > $R'24 24 28  'Ot$ GLUCOSE 141*   < > 229* 150* 176*  BUN 15   < > $R'14 11 14  'Xy$ CREATININE 0.67   < > 0.82 0.77 0.81  CALCIUM 9.1   < > 8.7* 9.1 9.3  GFRNONAA >60  --  >60  --  >60   < > = values in this interval not displayed.   CrCl cannot be calculated (Patient's most recent lab result is older than the maximum 21 days allowed.).     Latest Ref Rng & Units 01/04/2022    9:35 AM 10/18/2021   12:22 PM 08/24/2021    9:40 AM  CMP  Glucose 70 - 99 mg/dL 176  150  229   BUN 8 - 23 mg/dL $Remove'14  11  14   'UFgMrxU$ Creatinine 0.44 - 1.00 mg/dL 0.81  0.77  0.82   Sodium 135 - 145 mmol/L 139  137  137  Potassium 3.5 - 5.1 mmol/L 4.8  4.9   4.6   Chloride 98 - 111 mmol/L 107  102  105   CO2 22 - 32 mmol/L $RemoveB'28  24  24   'nTIypHYJ$ Calcium 8.9 - 10.3 mg/dL 9.3  9.1  8.7   Total Protein 6.5 - 8.1 g/dL 6.4   6.2   Total Bilirubin 0.3 - 1.2 mg/dL 0.5   0.5   Alkaline Phos 38 - 126 U/L 66   82   AST 15 - 41 U/L 15   14   ALT 0 - 44 U/L 17   19       Latest Ref Rng & Units 01/04/2022    9:35 AM 10/18/2021   12:22 PM 08/24/2021    9:40 AM  CBC  WBC 4.0 - 10.5 K/uL 21.4  18.4  19.5   Hemoglobin 12.0 - 15.0 g/dL 9.7  10.3  10.4   Hematocrit 36.0 - 46.0 % 33.2  33.0  33.2   Platelets 150 - 400 K/uL 106  100  96     Lipid Panel Recent Labs    10/18/21 1222  CHOL 125  TRIG 86  LDLCALC 65  HDL 43    HEMOGLOBIN A1C Lab Results  Component Value Date   HGBA1C 7.5 (H) 06/07/2021   MPG 168.55 06/07/2021   TSH Recent Labs    10/18/21 1222  TSH 1.400    External labs:  05/28/2021: Glucose 141, creatinine 0.8, GFR >60, sodium 135, potassium 5.1, BUN 12 Hgb 11.8, HCT 36.8 Total cholesterol 113, triglycerides 70, HDL 35, LDL 64 Lipoprotein B 60 (normal) TSH 1.52 A1c 7.6%  05/13/2020: Glucose 71, BUN 11, creatinine 0.7, GFR >60, sodium 142, potassium 4.6, AST 22, ALT 34, alk phos 90 Hemoglobin 12.4, hematocrit 38.4, MCV 84.1, platelet 83 Total cholesterol 120, triglycerides 86, HDL 36, LDL 67, lipoprotein B 59 TSH 2.03 A1c 6.2%  12/31/2019: Glucose BUN/Cr 10/0.8. eGFR 71 HbA1C 6.7%   09/2018: Chol 151, TG 164, HDL 33, LDL 85 TSH 1.9 normal  Allergies   Allergies  Allergen Reactions   Benzonatate Anaphylaxis and Hives   Fluticasone Furoate-Vilanterol Anaphylaxis and Hives    Other reaction(s): hives  breo ellipta   Iodine Hives, Swelling and Other (See Comments)    And Betadine/caused hives and swelling   Latex Hives, Itching, Rash and Other (See Comments)    Burning   Simvastatin Other (See Comments)    Caused neuropathy in arms Neuropathy in arms   Bupropion Other (See Comments)    Negative thoughts. Crazy  thoughts   Levofloxacin Other (See Comments)    Joint aches//leg, shoulder pain Joint pain   Penicillins Hives   Povidone-Iodine Hives   Amoxicillin Hives    Blisters Did it involve swelling of the face/tongue/throat, SOB, or low BP? Yes Did it involve sudden or severe rash/hives, skin peeling, or any reaction on the inside of your mouth or nose? Yes Did you need to seek medical attention at a hospital or doctor's office?Unknown When did it last happen? 2019      If all above answers are "NO", may proceed with cephalosporin use.    Chlorthalidone Other (See Comments)    Ears ringing    Diflucan [Fluconazole] Hives and Other (See Comments)    blister   Povidone Iodine    Amoxicillin-Pot Clavulanate Rash   Doxycycline Itching and Diarrhea    Medications Prior to Visit:   Outpatient Medications Prior to Visit  Medication Sig Dispense  Refill   albuterol (PROVENTIL HFA;VENTOLIN HFA) 108 (90 Base) MCG/ACT inhaler Inhale 1-2 puffs into the lungs every 6 (six) hours as needed (Cough).     amLODipine (NORVASC) 10 MG tablet Take 10 mg by mouth daily.     aspirin EC 81 MG tablet Take 1 tablet (81 mg total) by mouth daily. Swallow whole. 90 tablet 3   azelastine (ASTELIN) 0.1 % nasal spray Place 1 spray into both nostrils 2 (two) times daily. Use in each nostril as directed 90 mL 3   b complex vitamins capsule Take 1 capsule by mouth daily.     Calcium-Magnesium-Zinc (CAL-MAG-ZINC PO) Take 2 tablets by mouth daily.     Carboxymethylcellul-Glycerin (REFRESH OPTIVE PF OP) Place 1 drop into both eyes daily as needed (dry eyes). Non-pres     Coenzyme Q10 (CO Q-10) 100 MG CAPS Take 100 mg by mouth daily.     desoximetasone (TOPICORT) 0.25 % cream Apply 1 application topically 4 (four) times daily as needed (rash).     diclofenac sodium (VOLTAREN) 1 % GEL Apply 2 g topically 2 (two) times daily as needed (knee pain/ foot /Triger finger).  2   fluticasone (FLONASE) 50 MCG/ACT nasal spray Place 1  spray into both nostrils daily.     gabapentin (NEURONTIN) 300 MG capsule 600 mg daily.     hydrALAZINE (APRESOLINE) 25 MG tablet Take 1 tablet by mouth twice daily 60 tablet 0   insulin NPH Human (HUMULIN N,NOVOLIN N) 100 UNIT/ML injection Inject 30 Units into the skin See admin instructions. Inject 30 units subcutaneously at breakfast, 30 units at lunch & 30 units at supper     insulin regular (NOVOLIN R,HUMULIN R) 100 units/mL injection Inject 30 Units into the skin 3 (three) times daily before meals.     irbesartan (AVAPRO) 300 MG tablet Take 300 mg by mouth daily.     metFORMIN (GLUCOPHAGE-XR) 500 MG 24 hr tablet Take 1,000 mg by mouth 2 (two) times daily.     omeprazole (PRILOSEC) 40 MG capsule Take 40 mg by mouth 2 (two) times daily.     rosuvastatin (CRESTOR) 10 MG tablet Take 1 tablet (10 mg total) by mouth daily. (Patient taking differently: Take 10 mg by mouth daily with supper.) 90 tablet 3   sertraline (ZOLOFT) 50 MG tablet Take 50 mg by mouth daily.     spironolactone (ALDACTONE) 50 MG tablet Take 1 tablet by mouth once daily 90 tablet 3   terconazole (TERAZOL 3) 0.8 % vaginal cream Place 1 applicator vaginally as needed (yeast infections).      Wheat Dextrin (BENEFIBER PO) Take by mouth. Take 3/4 tsp daily     LORazepam (ATIVAN) 1 MG tablet 1-2 tabs prior to MRI (1 hour before) (Patient not taking: Reported on 02/17/2022) 2 tablet 0   PARoxetine (PAXIL) 10 MG tablet Take 10 mg by mouth daily. (Patient not taking: Reported on 01/04/2022)     vitamin C (ASCORBIC ACID) 500 MG tablet Take 500 mg by mouth 2 (two) times a day. (Patient not taking: Reported on 01/04/2022)     No facility-administered medications prior to visit.   Final Medications at End of Visit    Current Meds  Medication Sig   albuterol (PROVENTIL HFA;VENTOLIN HFA) 108 (90 Base) MCG/ACT inhaler Inhale 1-2 puffs into the lungs every 6 (six) hours as needed (Cough).   amLODipine (NORVASC) 10 MG tablet Take 10 mg by mouth  daily.   aspirin EC 81 MG tablet Take  1 tablet (81 mg total) by mouth daily. Swallow whole.   azelastine (ASTELIN) 0.1 % nasal spray Place 1 spray into both nostrils 2 (two) times daily. Use in each nostril as directed   b complex vitamins capsule Take 1 capsule by mouth daily.   Calcium-Magnesium-Zinc (CAL-MAG-ZINC PO) Take 2 tablets by mouth daily.   Carboxymethylcellul-Glycerin (REFRESH OPTIVE PF OP) Place 1 drop into both eyes daily as needed (dry eyes). Non-pres   Coenzyme Q10 (CO Q-10) 100 MG CAPS Take 100 mg by mouth daily.   desoximetasone (TOPICORT) 0.25 % cream Apply 1 application topically 4 (four) times daily as needed (rash).   diclofenac sodium (VOLTAREN) 1 % GEL Apply 2 g topically 2 (two) times daily as needed (knee pain/ foot /Triger finger).   fluticasone (FLONASE) 50 MCG/ACT nasal spray Place 1 spray into both nostrils daily.   gabapentin (NEURONTIN) 300 MG capsule 600 mg daily.   hydrALAZINE (APRESOLINE) 25 MG tablet Take 1 tablet by mouth twice daily   insulin NPH Human (HUMULIN N,NOVOLIN N) 100 UNIT/ML injection Inject 30 Units into the skin See admin instructions. Inject 30 units subcutaneously at breakfast, 30 units at lunch & 30 units at supper   insulin regular (NOVOLIN R,HUMULIN R) 100 units/mL injection Inject 30 Units into the skin 3 (three) times daily before meals.   irbesartan (AVAPRO) 300 MG tablet Take 300 mg by mouth daily.   metFORMIN (GLUCOPHAGE-XR) 500 MG 24 hr tablet Take 1,000 mg by mouth 2 (two) times daily.   omeprazole (PRILOSEC) 40 MG capsule Take 40 mg by mouth 2 (two) times daily.   rosuvastatin (CRESTOR) 10 MG tablet Take 1 tablet (10 mg total) by mouth daily. (Patient taking differently: Take 10 mg by mouth daily with supper.)   sertraline (ZOLOFT) 50 MG tablet Take 50 mg by mouth daily.   spironolactone (ALDACTONE) 50 MG tablet Take 1 tablet by mouth once daily   terconazole (TERAZOL 3) 0.8 % vaginal cream Place 1 applicator vaginally as needed  (yeast infections).    Wheat Dextrin (BENEFIBER PO) Take by mouth. Take 3/4 tsp daily   Radiology:   No results found.  Cardiac Studies:  PC[facility] ECHOCARDIOGRAM COMPLETE 65/78/4696 Normal LV systolic function with visual EF 60-65%. Left ventricle cavity is normal in size. Mild to moderate left ventricular hypertrophy. Normal global wall motion. Doppler evidence of grade II (pseudonormal) diastolic dysfunction, elevated LAP. Left atrial cavity is mildly dilated. Mild (Grade I) aortic regurgitation. Mild (Grade I) mitral regurgitation. Mild tricuspid regurgitation. No evidence of pulmonary hypertension. Compared to study dated 02/27/2019: G1DD is now G2DD, elevated LAP is new, moderate AR is now mild, otherwise no significant change.   PCV MYOCARDIAL PERFUSION WITH LEXISCAN 11/08/2021 Lexiscan nuclear stress test performed using 1-day protocol. Decreased tracer uptake in inferior myocardium, more prominent in rest images. In absence of any associated wall motion and thickening abnormality, this most likely represents breast tissue attenuation, with imaging performed in sitting position. Stress LVEF 72%. Low risk study.   Echocardiogram 02/03/2021:  Normal LV systolic function with visual EF 60-65%. Left ventricle cavity  is normal in size. Mild to moderate left ventricular hypertrophy. Normal  global wall motion. Doppler evidence of grade II (pseudonormal) diastolic dysfunction, elevated LAP.  Left atrial cavity is mildly dilated.  Mild (Grade I) aortic regurgitation.  Mild (Grade I) mitral regurgitation.  Mild tricuspid regurgitation. No evidence of pulmonary hypertension.  Compared to study dated 02/27/2019: G1DD is now G2DD, elevated LAP is new, moderate AR  is now mild, otherwise no significant change.   Carotid artery duplex 02/01/2022:  Duplex suggests stenosis in the right internal carotid artery (50-69%).  Duplex suggests stenosis in the right external carotid artery (<50%).  Duplex  suggests stenosis in the left internal carotid artery (50-69%).  Duplex suggests stenosis in the left external carotid artery (<50%).  Compared to 02/03/2021, there is progression of stenosis severity in bilateral carotid arteries. Follow up in six months is appropriate if clinically indicated.  EKG:  10/18/2021: Sinus rhythm at a rate of 69 bpm.  Normal axis.  Poor progression, cannot exclude intraseptal infarct old.  No evidence of ischemia or underlying injury pattern.  Compared EKG 03/08/2021, no significant change.  EKG 03/09/2020: Sinus rhythm 76 bpm Normal EKG  Assessment     ICD-10-CM   1. Asymptomatic stenosis of left carotid artery  I65.22     2. Dyspnea on exertion  R06.09        Medications Discontinued During This Encounter  Medication Reason   LORazepam (ATIVAN) 1 MG tablet    PARoxetine (PAXIL) 10 MG tablet    vitamin C (ASCORBIC ACID) 500 MG tablet      No orders of the defined types were placed in this encounter.   Recommendations:   Janice [name] is a 71 y.o. female with hypertension, type 2 DM, moderate AI, carotid artery stenosis. History of significant anemia, which lead to discontinuation of aspirin. Patient reported intolerance to high dose Crestor in the past.   Patient was last seen in the office 11/15/2021 at which time echocardiogram and stress test were reassuring, therefore advised patient to focus on increasing physical activity and weight loss, no changes were made.  Patient now presents for 56-month follow-up.  Suspect ongoing dyspnea is related to underlying anemia, will defer further management to PCP and hematology.  Again reviewed and discussed echocardiogram and stress test, which were reassuring.  Patient's dyspnea is likely multifactorial including obesity, deconditioning, and underlying diastolic dysfunction.  Patient has had no recurrence of chest pain, will therefore hold off on further ischemic evaluation.  Blood pressure is fairly well  controlled.  Continue aspirin, amlodipine, hydralazine, irbesartan, spironolactone, and Crestor.  Discussed results of carotid artery duplex, details above.  Will repeat surveillance in 6 months given progression of carotid disease.  Follow-up in 6 months, sooner if needed.   Janice Berthold, PA-C 02/17/2022, 10:26 AM Office: [phone]

## 2022-02-25 DIAGNOSIS — G4733 Obstructive sleep apnea (adult) (pediatric): Secondary | ICD-10-CM | POA: Diagnosis not present

## 2022-03-19 ENCOUNTER — Other Ambulatory Visit: Payer: Self-pay | Admitting: Student

## 2022-03-29 DIAGNOSIS — R7989 Other specified abnormal findings of blood chemistry: Secondary | ICD-10-CM | POA: Diagnosis not present

## 2022-03-29 DIAGNOSIS — I10 Essential (primary) hypertension: Secondary | ICD-10-CM | POA: Diagnosis not present

## 2022-03-29 DIAGNOSIS — E782 Mixed hyperlipidemia: Secondary | ICD-10-CM | POA: Diagnosis not present

## 2022-03-29 DIAGNOSIS — D649 Anemia, unspecified: Secondary | ICD-10-CM | POA: Diagnosis not present

## 2022-03-30 ENCOUNTER — Ambulatory Visit: Payer: PPO | Admitting: Internal Medicine

## 2022-03-30 ENCOUNTER — Encounter: Payer: Self-pay | Admitting: Internal Medicine

## 2022-03-30 VITALS — BP 124/74 | HR 76 | Ht 64.5 in | Wt 214.0 lb

## 2022-03-30 DIAGNOSIS — D649 Anemia, unspecified: Secondary | ICD-10-CM

## 2022-03-30 DIAGNOSIS — R932 Abnormal findings on diagnostic imaging of liver and biliary tract: Secondary | ICD-10-CM | POA: Diagnosis not present

## 2022-03-30 DIAGNOSIS — K7460 Unspecified cirrhosis of liver: Secondary | ICD-10-CM | POA: Diagnosis not present

## 2022-03-30 DIAGNOSIS — K7581 Nonalcoholic steatohepatitis (NASH): Secondary | ICD-10-CM | POA: Diagnosis not present

## 2022-03-30 DIAGNOSIS — R0609 Other forms of dyspnea: Secondary | ICD-10-CM

## 2022-03-30 NOTE — Progress Notes (Addendum)
Janice [name] 71 y.o. Nov 17, 1950 [id number]  Assessment & Plan:   Encounter Diagnoses  Name Primary?   Liver cirrhosis secondary to NASH (nonalcoholic steatohepatitis) (HCC) Yes   Abnormal MRI, liver    Normocytic anemia    DOE (dyspnea on exertion)    Recent MRI does not show focal lesions of the liver so do not think she has any signs of hepatocellular carcinoma or other liver lesions.  Plan to reassess with ultrasound (less likely MRI) in 1 year from that exam so May or June 2024.    Regarding anemia and dyspnea on exertion they may be related.  She has a normocytic anemia.  She reports having labs performed at primary care office yesterday I do not have those results.  Multiple possible causes for anemia.  She has a history of iron deficiency anemia which could have been related to a bleeding gastric polyp that was removed a number of years ago.  She apparently does not have celiac disease she has been tested for that with negative IgG and IgA antibody levels though she has slightly low IgG and IgA total levels which I suppose could affect that test.  I do not think she has had duodenal biopsies before.  I have a low index of suspicion of celiac disease.  Anemia could be related to CLL.  B12 deficiency is also a possibility.  Await results of Janice [name] lab testing from yesterday.  I am not inclined to schedule any endoscopic evaluation at this time given what she has had over the past several years.  We did have a discussion about small bowel capsule endoscopy and how that might be used to investigate, she is not inclined to pursue that regardless.  Follow-up me in approximately 6 months routinely sooner as needed.  CC: Janice [name] Janice [name]   Subjective:   Chief Complaint: Follow-up of cirrhosis and previous possible liver lesions on MRI, dyspnea/anemia  HPI Janice [name] is a 71 year old woman with Janice [name] cirrhosis, CLL/SLL who has had MRI imaging earlier this year raising  question of liver lesions and she had a follow-up MRI as below:   IMPRESSION: 1. Examination is generally limited by breath motion artifact, particularly multiphasic contrast enhanced sequences. Within this limitation, cirrhotic morphology of the liver without focal liver lesion. 2. Splenomegaly with numerous small unchanged hypoenhancing splenic lesions, again most likely Janice [name] bodies in the setting of cirrhosis. 3. Trace ascites. 4. Numerous enlarged gastrohepatic ligament, portacaval, retroperitoneal, and small bowel mesenteric lymph nodes are unchanged, in keeping with history of lymphoma.     Electronically Signed   By: Janice [name] M.D.   On: 02/07/2022 14:59  She is reporting problems with dyspnea on exertion she went to cardiology and had a negative evaluation and was told it was not her heart.  She has had progressive anemia.  There is a history of iron deficiency anemia in the past. She saw Janice [name] in April and he did discuss her anemia but referred her back to Janice [name] for that she says.  She reports he told her that parenteral iron infusions are not used much anymore.  I believe she has had those in the past.  She has not responded to oral iron in the past she says so she does not take that anymore.  September 2022 Hemosure stool test at primary care was negative.     Latest Ref Rng & Units 01/04/2022    9:35 AM 10/18/2021   12:22  PM 08/24/2021    9:40 AM  CBC  WBC 4.0 - 10.5 K/uL 21.4  18.4  19.5   Hemoglobin 12.0 - 15.0 g/dL 9.7  10.3  10.4   Hematocrit 36.0 - 46.0 % 33.2  33.0  33.2   Platelets 150 - 400 K/uL 106  100  96    Colonoscopy 08/01/2016 3 adenomas max 6 mm and diverticulosis EGD 9/26/202019 10 mm antral hyperplastic polyp thought to have been causing some bleeding removed via snare Colonoscopy 06/08/2019 diverticulosis EGD 06/09/2021 no varices no portal gastropathy, reactive gastropathy in the antrum (nodular)  No recent ferritin or B12 level  she did have labs yesterday at primary care she thinks they checked her iron and her hemoglobin.  She denies any bleeding abdominal pain change in bowel habits.  He does have iron in her diet. Celiac testing with tissue transglutaminase antibodies IgG and IgA were negative in 2021.  She has a slightly low IgA level and IgG level.   Data reviewed as above, I have reviewed Janice [name]. Allergies  Allergen Reactions   Benzonatate Anaphylaxis and Hives   Fluticasone Furoate-Vilanterol Anaphylaxis and Hives    Other reaction(s): hives  breo ellipta   Iodine Hives, Swelling and Other (See Comments)    And Betadine/caused hives and swelling   Latex Hives, Itching, Rash and Other (See Comments)    Burning   Simvastatin Other (See Comments)    Caused neuropathy in arms Neuropathy in arms   Bupropion Other (See Comments)    Negative thoughts. Crazy thoughts   Levofloxacin Other (See Comments)    Joint aches//leg, shoulder pain Joint pain   Penicillins Hives   Povidone-Iodine Hives   Amoxicillin Hives    Blisters Did it involve swelling of the face/tongue/throat, SOB, or low BP? Yes Did it involve sudden or severe rash/hives, skin peeling, or any reaction on the inside of your mouth or nose? Yes Did you need to seek medical attention at a hospital or doctor's office?Unknown When did it last happen? 2019      If all above answers are "NO", may proceed with cephalosporin use.    Chlorthalidone Other (See Comments)    Ears ringing    Diflucan [Fluconazole] Hives and Other (See Comments)    blister   Povidone Iodine    Amoxicillin-Pot Clavulanate Rash   Doxycycline Itching and Diarrhea   Current Meds  Medication Sig   albuterol (PROVENTIL HFA;VENTOLIN HFA) 108 (90 Base) MCG/ACT inhaler Inhale 1-2 puffs into the lungs every 6 (six) hours as needed (Cough).   amLODipine (NORVASC) 10 MG tablet Take 10 mg by mouth daily.   aspirin EC 81 MG tablet Take  1 tablet (81 mg total) by mouth daily. Swallow whole.   b complex vitamins capsule Take 1 capsule by mouth daily.   Calcium-Magnesium-Zinc (CAL-MAG-ZINC PO) Take 2 tablets by mouth daily.   Carboxymethylcellul-Glycerin (REFRESH OPTIVE PF OP) Place 1 drop into both eyes daily as needed (dry eyes). Non-pres   Coenzyme Q10 (CO Q-10) 100 MG CAPS Take 100 mg by mouth daily.   desoximetasone (TOPICORT) 0.25 % cream Apply 1 application topically 4 (four) times daily as needed (rash).   diclofenac sodium (VOLTAREN) 1 % GEL Apply 2 g topically 2 (two) times daily as needed (knee pain/ foot /Triger finger).   fluticasone (FLONASE) 50 MCG/ACT nasal spray Place 1 spray into both nostrils daily.   gabapentin (NEURONTIN) 300 MG capsule 600 mg daily.  hydrALAZINE (APRESOLINE) 25 MG tablet Take 1 tablet by mouth twice daily   insulin NPH Human (HUMULIN N,NOVOLIN N) 100 UNIT/ML injection Inject 30 Units into the skin See admin instructions. Inject 30 units subcutaneously at breakfast, 30 units at lunch & 30 units at supper   insulin regular (NOVOLIN R,HUMULIN R) 100 units/mL injection Inject 30 Units into the skin 3 (three) times daily before meals.   irbesartan (AVAPRO) 300 MG tablet Take 300 mg by mouth daily.   metFORMIN (GLUCOPHAGE-XR) 500 MG 24 hr tablet Take 1,000 mg by mouth 2 (two) times daily.   omeprazole (PRILOSEC) 40 MG capsule Take 40 mg by mouth 2 (two) times daily.   rosuvastatin (CRESTOR) 10 MG tablet Take 1 tablet (10 mg total) by mouth daily. (Patient taking differently: Take 10 mg by mouth daily with supper.)   sertraline (ZOLOFT) 50 MG tablet Take 50 mg by mouth daily.   spironolactone (ALDACTONE) 50 MG tablet Take 1 tablet by mouth once daily   terconazole (TERAZOL 3) 0.8 % vaginal cream Place 1 applicator vaginally as needed (yeast infections).    Wheat Dextrin (BENEFIBER PO) Take by mouth. Take 3/4 tsp daily   Past Medical History:  Diagnosis Date   Allergy    Asthma    seasonal    Cellulitis 2013   Left toe   Cirrhosis ([place])    Common migraine    History of   Complication of anesthesia    Per pt/had breathing problems with "block" during rotator cuff surgery. 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[place])    Past Surgical History:  Procedure Laterality Date   ANKLE SURGERY     Left    COLONOSCOPY  05/2018   LEEP N/A 09/14/2018   Procedure: LOOP ELECTROSURGICAL EXCISION PROCEDURE (LEEP);  Surgeon: Arvella Nigh, [name];  Location[facility];  Service: Gynecology;  Laterality: N/A;   MASS EXCISION Right 06/24/2021   Procedure: EXCISION RIGHT POSTERIOR NECK MASS;  Surgeon: Kinsinger, [name], [name];  Location: [facility];  Service: General;  Laterality: Right;   ROTATOR CUFF REPAIR Bilateral 2012, 2015   TONSILLECTOMY     TOTAL KNEE ARTHROPLASTY Left 04/08/2019   Procedure: TOTAL KNEE ARTHROPLASTY;  Surgeon: Elsie Saas, [name];  Location: [facility];  Service: Orthopedics;  Laterality: Left;   UPPER GI ENDOSCOPY  05/2018   Social History   Social History Narrative   1 caffeine drink daily    Right-handed   widow (husband died from [place] cirrhosis)   family history includes Bone cancer in her mother; Breast cancer in her mother; Diabetes Mellitus II in her brother, father, and sister; Heart failure in her father; Hypertension in her brother and father; Obesity in her sister;  Other in  her sister.   Review of Systems As above  Objective:   Physical Exam '@BP'$  124/74   Pulse 76   Ht 5' 4.5" (1.638 m)   Wt 214 lb (97.1 kg)   BMI 36.17 kg/m @  General:  NAD Eyes:   anicteric Lungs:  clear Heart::  S1S2 no rubs, murmurs or gallops Abdomen:  Obese, soft and nontender, BS+ no obvious hepatosplenomegaly though obesity could limit exam sensitivity Ext:   no edema, cyanosis or clubbing    Data Reviewed:  See HPI

## 2022-03-30 NOTE — Patient Instructions (Signed)
I will wait to see what the labs Dr. Dagmar Hait checked tell us re: anemia.   Unless there is a reason to see you sooner please call us in November to get a January appointment.  I appreciate the opportunity to care for you. Gatha Mayer, MD, [facility]

## 2022-04-06 DIAGNOSIS — N182 Chronic kidney disease, stage 2 (mild): Secondary | ICD-10-CM | POA: Diagnosis not present

## 2022-04-06 DIAGNOSIS — E119 Type 2 diabetes mellitus without complications: Secondary | ICD-10-CM | POA: Diagnosis not present

## 2022-04-06 DIAGNOSIS — I129 Hypertensive chronic kidney disease with stage 1 through stage 4 chronic kidney disease, or unspecified chronic kidney disease: Secondary | ICD-10-CM | POA: Diagnosis not present

## 2022-04-06 DIAGNOSIS — Z794 Long term (current) use of insulin: Secondary | ICD-10-CM | POA: Diagnosis not present

## 2022-04-06 DIAGNOSIS — E782 Mixed hyperlipidemia: Secondary | ICD-10-CM | POA: Diagnosis not present

## 2022-05-02 IMAGING — US US ABDOMEN COMPLETE
1 series · 13 of 25 positions shown · non-contrast
Comparison: PET-CT 05/01/2020.

CLINICAL DATA: History of cirrhosis, ascites, splenomegaly. History
of lymphoma.

EXAM:
ABDOMEN ULTRASOUND COMPLETE

[Series 1: us abdomen complete · 13 of 102 slices shown]
[im 1/102]
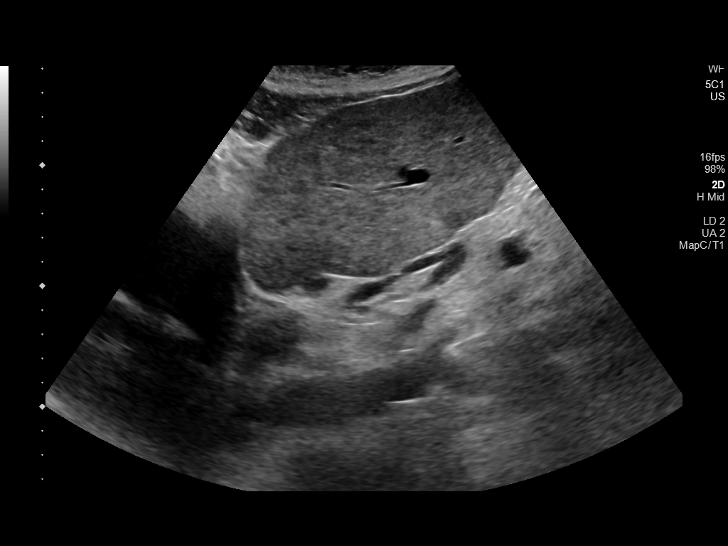
[im 9/102]
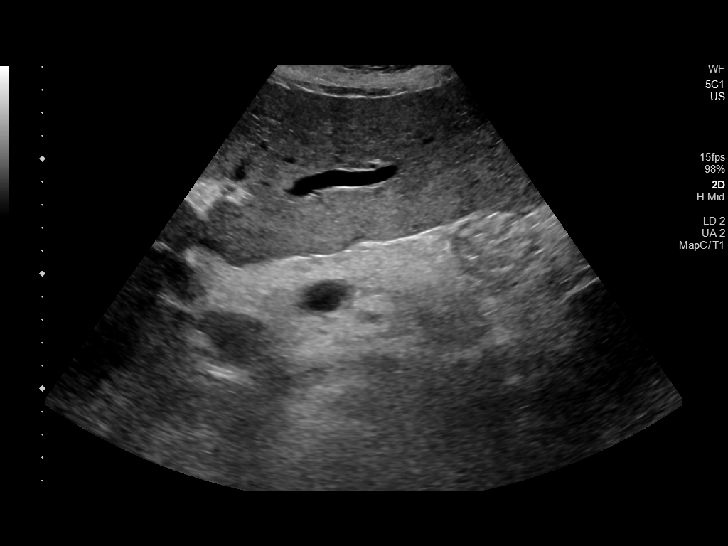
[im 17/102]
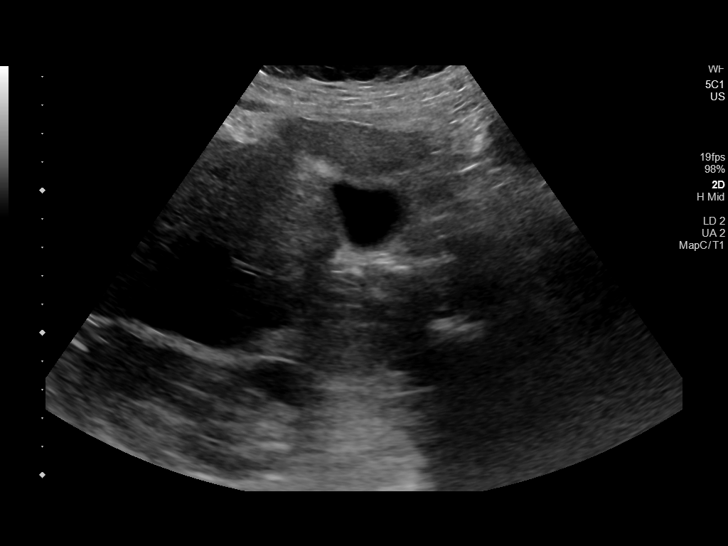
[im 26/102]
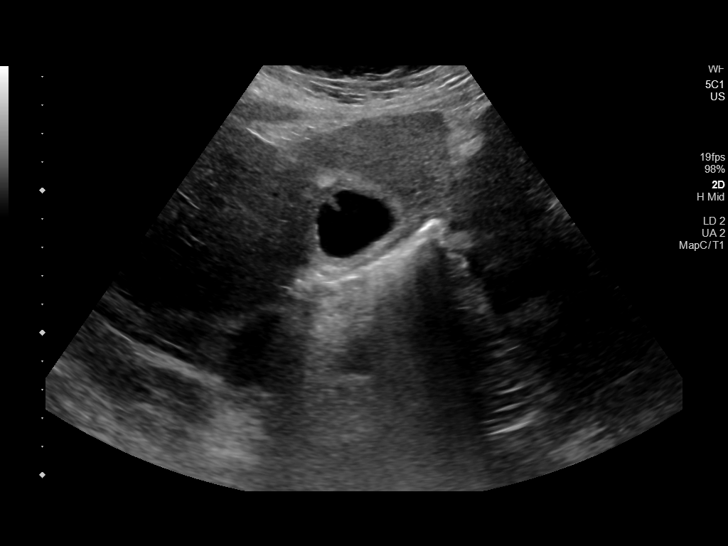
[im 34/102]
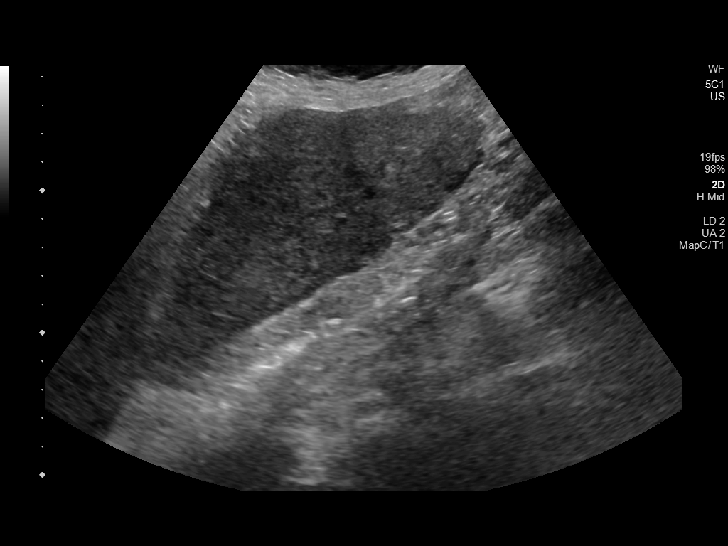
[im 43/102]
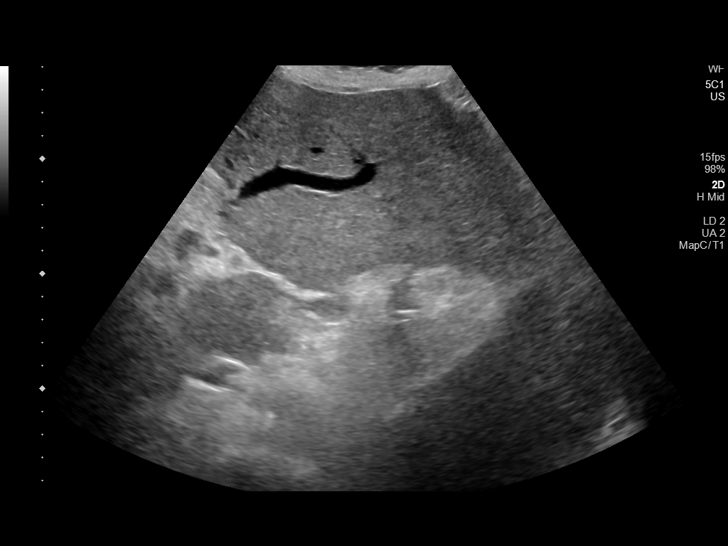
[im 51/102]
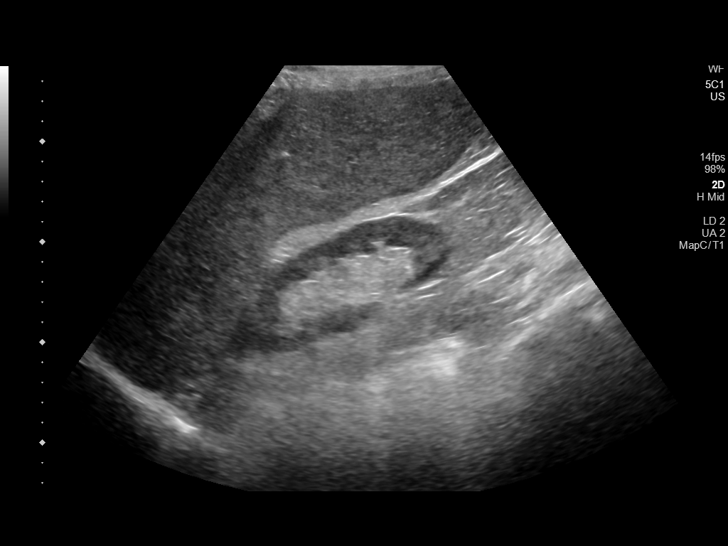
[im 59/102]
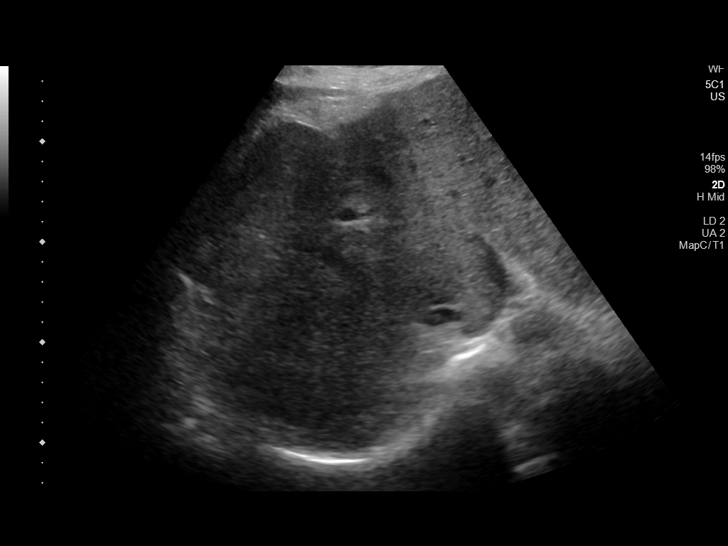
[im 68/102]
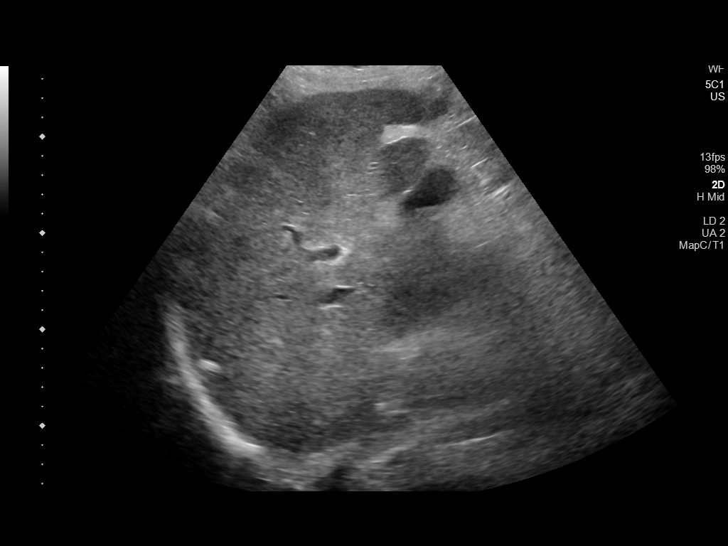
[im 76/102]
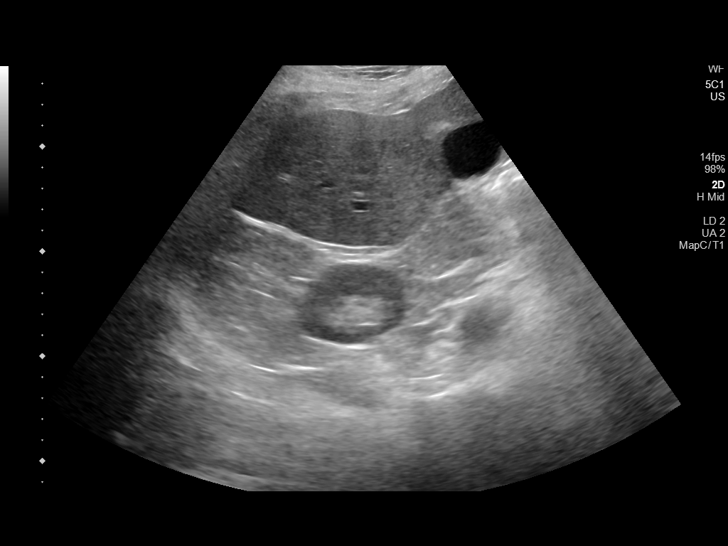
[im 85/102]
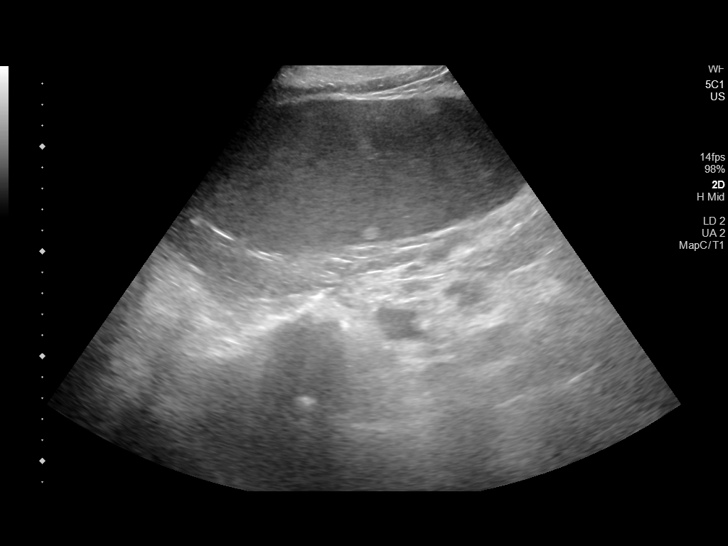
[im 93/102]
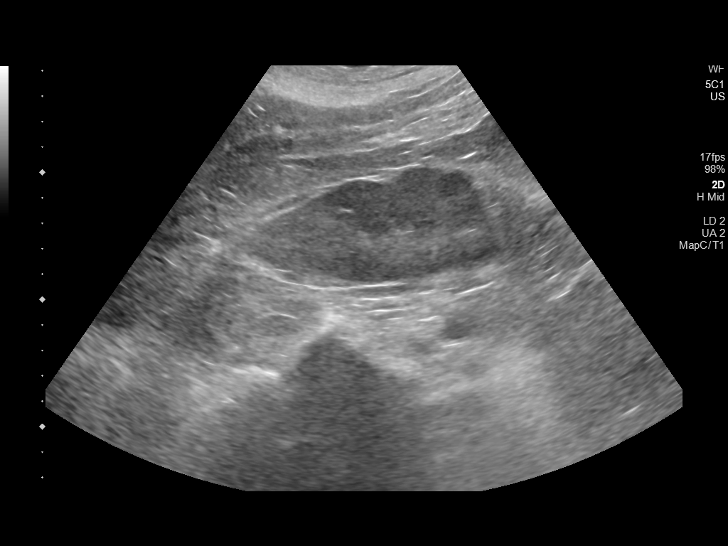
[im 102/102]
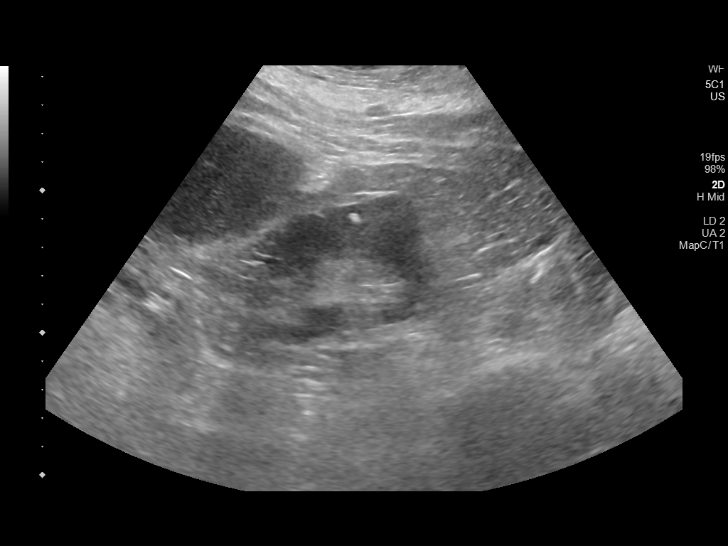

[13 of 25 positions shown; findings below may reference images not displayed]

FINDINGS: Gallbladder: Limited exam due to patient's body habitus. No
gallstones noted. 2 mm gallbladder polyp. Gallbladder wall is
thickened at 6 mm. Although cholecystitis can not be excluded, these
findings may be related to hypoproteinemic state. Negative Murphy
sign.

Common bile duct: Diameter: 2 mm

Liver: Heterogeneous hepatic parenchymal pattern and slightly
nodular hepatic contour consistent with the patient's known
cirrhosis. Differential diagnosis includes hemangiomas, infectious
etiologies, metastatic disease, and lymphoma. A 1.4 cm and 0.8 cm
hyperechoic focus noted the left hepatic lobe. Punctate echogenic
focus noted right hepatic lobe, most likely calcification. Portal
vein is patent on color Doppler imaging with normal direction of
blood flow towards the liver.

IVC: No abnormality visualized.

Pancreas: Visualized portion unremarkable.

Spleen: The spleen is enlarged at 19.6 cm. Multiple hyperechoic foci
are noted with the largest measuring 1.5 cm. Differential diagnosis
includes multiple vascular lesions, infectious etiologies,
metastatic disease, lymphoma.

Right Kidney: Length: 12.3 cm. Echogenicity within normal limits. No
mass or hydronephrosis visualized.

Left Kidney: Length: 12.9 cm. Echogenicity within normal limits.
Punctate hyperechoic nonshadowing focus noted in the left kidney.
This could represent a nonshadowing cortical calcification or tiny
angiomyolipoma. No associated mass. No hydronephrosis visualized.

Abdominal aorta: Upper abdominal aorta appears normal with a
diameter 1.7 cm distal abdominal aorta difficult to visualize due to
overlying bowel gas.

Other findings: No evidence of ascites.
IMPRESSION: 1. Limited exam due to patient's body habitus. No gallstones. 2 mm
gallbladder polyp. Gallbladder wall is thickened at 6 mm. Although
cholecystitis can not be excluded, these findings may be related to
hypoproteinemic state. No biliary distention. Negative Murphy sign.

2. Hyperechoic foci are noted in the liver and spleen. Differential
diagnosis includes hemangiomas, metastatic disease, and lymphoma.

3. Heterogeneous hepatic parenchymal pattern with slightly nodular
hepatic contour consistent with the patient's known history of
cirrhosis.

4.  Splenomegaly to 19.6 cm.

5. Punctate hyperechoic nonshadowing focus noted in the left kidney.
This could represent a nonshadowing cortical calcification or tiny
angiomyolipoma. No associated mass.

## 2022-05-06 ENCOUNTER — Other Ambulatory Visit: Payer: Self-pay

## 2022-05-06 MED ORDER — HYDRALAZINE HCL 25 MG PO TABS
25.0000 mg | ORAL_TABLET | Freq: Two times a day (BID) | ORAL | 2 refills | Status: DC
Start: 2022-05-06 — End: 2022-09-29

## 2022-05-09 DIAGNOSIS — D485 Neoplasm of uncertain behavior of skin: Secondary | ICD-10-CM | POA: Diagnosis not present

## 2022-05-09 DIAGNOSIS — D1801 Hemangioma of skin and subcutaneous tissue: Secondary | ICD-10-CM | POA: Diagnosis not present

## 2022-05-09 DIAGNOSIS — D2261 Melanocytic nevi of right upper limb, including shoulder: Secondary | ICD-10-CM | POA: Diagnosis not present

## 2022-05-09 DIAGNOSIS — D225 Melanocytic nevi of trunk: Secondary | ICD-10-CM | POA: Diagnosis not present

## 2022-05-09 DIAGNOSIS — L821 Other seborrheic keratosis: Secondary | ICD-10-CM | POA: Diagnosis not present

## 2022-05-17 ENCOUNTER — Telehealth: Payer: Self-pay | Admitting: Internal Medicine

## 2022-05-17 NOTE — Telephone Encounter (Signed)
Pt notified of Dr. Carlean Purl recommendations: Pt stated that she has not gained any weight: Pt notified to contact Dr. Kieth Brightly  at [facility]:  Pt stated that she would like to schedule a follow up with Dr. Carlean Purl:  Pt was scheduled for 07/19/2022 at 10:10 AM  Pt verbalized understanding with all questions answered.

## 2022-05-17 NOTE — Telephone Encounter (Signed)
Inbound call from patient inquiring about Umbilical Hernia? Please give a call back to further advise.

## 2022-05-17 NOTE — Telephone Encounter (Signed)
Pt stated that she is having Umbilical Hernia discomfort  for about a week and ahalf: Pt stated that she feels the discomfort when she is brushing her teeth or cooking and leans agaist the counter top: Pt states that her shorts are also causing the discomfort: Pt stated that she has discussed this with Dr Carlean Purl at past office visits:  Please advise

## 2022-05-17 NOTE — Telephone Encounter (Signed)
Hernia care is through surgery in most cases so she should see [facility] - was seen by Dr. Kieth Brightly in past and his notes indicated reassess if having pain at hernia  In her situation surgery might not be a good option (liver disease increases risks)  However, she should go there to be evaluated.  If she has gained a lot of weight and believes abdomen is more swollen, let me know and we will check for increasing fluid in abdomen with an ultrasound.  If she gets severe unrelenting pain go to ED

## 2022-05-17 NOTE — Telephone Encounter (Addendum)
Patient called stating that she called [facility] and they stated that she needs a referral to be sent before she can schedule. Patient is requesting a call back to let her know that the referral has been sent. Please advise.

## 2022-05-19 NOTE — Telephone Encounter (Signed)
Referral was sent  Dr. Kieth Brightly  at [facility] along with Pt records: Pt made aware and notified that if she has not heard anything in 2 weeks from there office to contact us: Pt verbalized understanding with all questions answered.

## 2022-05-26 DIAGNOSIS — R82998 Other abnormal findings in urine: Secondary | ICD-10-CM | POA: Diagnosis not present

## 2022-05-31 ENCOUNTER — Other Ambulatory Visit: Payer: Self-pay

## 2022-05-31 DIAGNOSIS — C9110 Chronic lymphocytic leukemia of B-cell type not having achieved remission: Secondary | ICD-10-CM

## 2022-06-03 ENCOUNTER — Inpatient Hospital Stay: Payer: HMO | Admitting: Hematology

## 2022-06-03 ENCOUNTER — Other Ambulatory Visit: Payer: Self-pay

## 2022-06-03 ENCOUNTER — Inpatient Hospital Stay: Payer: HMO | Attending: Hematology

## 2022-06-03 VITALS — BP 143/46 | HR 76 | Temp 97.9°F | Resp 16 | Wt 227.6 lb

## 2022-06-03 DIAGNOSIS — M1712 Unilateral primary osteoarthritis, left knee: Secondary | ICD-10-CM | POA: Diagnosis not present

## 2022-06-03 DIAGNOSIS — E785 Hyperlipidemia, unspecified: Secondary | ICD-10-CM | POA: Diagnosis not present

## 2022-06-03 DIAGNOSIS — Z79899 Other long term (current) drug therapy: Secondary | ICD-10-CM | POA: Diagnosis not present

## 2022-06-03 DIAGNOSIS — D509 Iron deficiency anemia, unspecified: Secondary | ICD-10-CM | POA: Diagnosis not present

## 2022-06-03 DIAGNOSIS — C9110 Chronic lymphocytic leukemia of B-cell type not having achieved remission: Secondary | ICD-10-CM

## 2022-06-03 DIAGNOSIS — D508 Other iron deficiency anemias: Secondary | ICD-10-CM | POA: Diagnosis not present

## 2022-06-03 DIAGNOSIS — Z96652 Presence of left artificial knee joint: Secondary | ICD-10-CM | POA: Diagnosis not present

## 2022-06-03 DIAGNOSIS — C8300 Small cell B-cell lymphoma, unspecified site: Secondary | ICD-10-CM | POA: Diagnosis not present

## 2022-06-03 DIAGNOSIS — E118 Type 2 diabetes mellitus with unspecified complications: Secondary | ICD-10-CM | POA: Diagnosis not present

## 2022-06-03 DIAGNOSIS — K7460 Unspecified cirrhosis of liver: Secondary | ICD-10-CM | POA: Insufficient documentation

## 2022-06-03 DIAGNOSIS — D696 Thrombocytopenia, unspecified: Secondary | ICD-10-CM | POA: Insufficient documentation

## 2022-06-03 DIAGNOSIS — I10 Essential (primary) hypertension: Secondary | ICD-10-CM | POA: Insufficient documentation

## 2022-06-03 LAB — CMP (CANCER CENTER ONLY)
ALT: 16 U/L (ref 0–44)
AST: 14 U/L — ABNORMAL LOW (ref 15–41)
Albumin: 4.4 g/dL (ref 3.5–5.0)
Alkaline Phosphatase: 71 U/L (ref 38–126)
Anion gap: 3 — ABNORMAL LOW (ref 5–15)
BUN: 20 mg/dL (ref 8–23)
CO2: 29 mmol/L (ref 22–32)
Calcium: 8.8 mg/dL — ABNORMAL LOW (ref 8.9–10.3)
Chloride: 105 mmol/L (ref 98–111)
Creatinine: 0.82 mg/dL (ref 0.44–1.00)
GFR, Estimated: >60 mL/min (ref >60)
Glucose, Bld: 192 mg/dL — ABNORMAL HIGH (ref 70–99)
Potassium: 4.7 mmol/L (ref 3.5–5.1)
Sodium: 137 mmol/L (ref 135–145)
Total Bilirubin: 0.5 mg/dL (ref 0.3–1.2)
Total Protein: 6.1 g/dL — ABNORMAL LOW (ref 6.5–8.1)

## 2022-06-03 LAB — CBC WITH DIFFERENTIAL (CANCER CENTER ONLY)
Abs Immature Granulocytes: 0.2 10*3/uL — ABNORMAL HIGH (ref 0.00–0.07)
Basophils Absolute: 0.1 10*3/uL (ref 0.0–0.1)
Basophils Relative: 0 %
Eosinophils Absolute: 0.2 10*3/uL (ref 0.0–0.5)
Eosinophils Relative: 1 %
HCT: 27.9 % — ABNORMAL LOW (ref 36.0–46.0)
Hemoglobin: 8.1 g/dL — ABNORMAL LOW (ref 12.0–15.0)
Immature Granulocytes: 1 %
Lymphocytes Relative: 79 %
Lymphs Abs: 29.1 10*3/uL — ABNORMAL HIGH (ref 0.7–4.0)
MCH: 22.6 pg — ABNORMAL LOW (ref 26.0–34.0)
MCHC: 29 g/dL — ABNORMAL LOW (ref 30.0–36.0)
MCV: 77.9 fL — ABNORMAL LOW (ref 80.0–100.0)
Monocytes Absolute: 1.3 10*3/uL — ABNORMAL HIGH (ref 0.1–1.0)
Monocytes Relative: 4 %
Neutro Abs: 5.4 10*3/uL (ref 1.7–7.7)
Neutrophils Relative %: 15 %
Platelet Count: 125 10*3/uL — ABNORMAL LOW (ref 150–400)
RBC: 3.58 MIL/uL — ABNORMAL LOW (ref 3.87–5.11)
RDW: 17.2 % — ABNORMAL HIGH (ref 11.5–15.5)
WBC Count: 36.4 10*3/uL — ABNORMAL HIGH (ref 4.0–10.5)
nRBC: 0.1 % (ref 0.0–0.2)

## 2022-06-03 LAB — IRON AND IRON BINDING CAPACITY (CC-WL,HP ONLY)
Iron: 29 ug/dL (ref 28–170)
Saturation Ratios: 6 % — ABNORMAL LOW (ref 10.4–31.8)
TIBC: 529 ug/dL — ABNORMAL HIGH (ref 250–450)
UIBC: 500 ug/dL — ABNORMAL HIGH (ref 148–442)

## 2022-06-03 LAB — LACTATE DEHYDROGENASE: LDH: 171 U/L (ref 98–192)

## 2022-06-03 LAB — FERRITIN: Ferritin: 4 ng/mL — ABNORMAL LOW (ref 11–307)

## 2022-06-06 ENCOUNTER — Other Ambulatory Visit: Payer: Self-pay

## 2022-06-06 ENCOUNTER — Ambulatory Visit: Payer: Self-pay | Admitting: Hematology

## 2022-06-07 DIAGNOSIS — E1140 Type 2 diabetes mellitus with diabetic neuropathy, unspecified: Secondary | ICD-10-CM | POA: Diagnosis not present

## 2022-06-07 DIAGNOSIS — E782 Mixed hyperlipidemia: Secondary | ICD-10-CM | POA: Diagnosis not present

## 2022-06-07 DIAGNOSIS — I10 Essential (primary) hypertension: Secondary | ICD-10-CM | POA: Diagnosis not present

## 2022-06-07 DIAGNOSIS — R7989 Other specified abnormal findings of blood chemistry: Secondary | ICD-10-CM | POA: Diagnosis not present

## 2022-06-09 ENCOUNTER — Other Ambulatory Visit: Payer: Self-pay

## 2022-06-09 DIAGNOSIS — C9110 Chronic lymphocytic leukemia of B-cell type not having achieved remission: Secondary | ICD-10-CM

## 2022-06-09 NOTE — Progress Notes (Signed)
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  INTERVAL HISTORY: Janice [name] is a 71 y.o. 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  We discussed her repeat MRI done 02/04/2022 which revealed splenomegaly with numerous unchanged hypoenhancing splenic lesions likely in the setting of cirrhosis. Trace ascities. Multiple lymph nodes that remain unchanged in the context of lymphoma. Otherwise reviewed as noted below.  No fever, chills, night sweats. No new lumps, bumps, or lesions/rashes. No abdominal pain or change in bowel habits. No new or unexpected weight loss. No SOB or chest pain. No bleeding issues. No blood in stools. No other new or acute focal symptoms.  Discussed pt labwork today, 06/03/2022.  MEDICAL HISTORY:  Past Medical History:  Diagnosis Date   Allergy    Asthma    seasonal   Cellulitis 2013   Left toe   Cirrhosis ([place])    Common migraine    History of   Complication of anesthesia    Per pt/had breathing problems with "block" during rotator cuff surgery. 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[place])     SURGICAL HISTORY: Past Surgical History:  Procedure Laterality Date   ANKLE SURGERY     Left    COLONOSCOPY  05/2018   LEEP N/A 09/14/2018   Procedure: LOOP ELECTROSURGICAL EXCISION PROCEDURE (LEEP);  Surgeon: Arvella Nigh, MD;  Location: [facility];  Service: Gynecology;  Laterality: N/A;   MASS EXCISION Right 06/24/2021   Procedure: EXCISION RIGHT POSTERIOR NECK MASS;  Surgeon: Kinsinger, [name], MD;  Location: [facility];  Service: General;  Laterality: Right;   ROTATOR CUFF REPAIR Bilateral 2012, 2015   TONSILLECTOMY     TOTAL KNEE ARTHROPLASTY Left 04/08/2019   Procedure: TOTAL KNEE ARTHROPLASTY;  Surgeon: Elsie Saas, MD;  Location: [facility];  Service: Orthopedics;  Laterality: Left;   UPPER GI ENDOSCOPY  05/2018    SOCIAL HISTORY: Social History   Socioeconomic History   Marital status: Widowed    Spouse name: Not on file   Number of children: 0   Years of education: 3 years college   Highest education level: Not on file  Occupational History   Occupation: Unemployed  Tobacco Use   Smoking status: Never   Smokeless tobacco: Never  Vaping Use   Vaping Use: Never used  Substance and Sexual Activity   Alcohol use: Not Currently  Comment: very rare   Drug use: No   Sexual activity: Not Currently    Birth control/protection: None, Post-menopausal  Other Topics Concern   Not on file  Social History Narrative   1 caffeine drink daily    Right-handed   widow (husband died from [place] cirrhosis)   Social Determinants of Health   Financial Resource Strain: Medium Risk (08/20/2020)   Overall Financial Resource Strain (CARDIA)    Difficulty of Paying Living Expenses: Somewhat hard  Food Insecurity: No Food Insecurity (08/20/2020)   Hunger Vital Sign    Worried About Running Out of Food in the Last Year: Never true    Ran Out of Food in the Last  Year: Never true  Transportation Needs: No Transportation Needs (08/20/2020)   PRAPARE - Hydrologist (Medical): No    Lack of Transportation (Non-Medical): No  Physical Activity: Not on file  Stress: Not on file  Social Connections: Socially Integrated (08/20/2020)   Social Connection and Isolation Panel [NHANES]    Frequency of Communication with Friends and Family: More than three times a week    Frequency of Social Gatherings with Friends and Family: More than three times a week    Attends Religious Services: More than 4 times per year    Active Member of [employer] or Organizations: Yes    Attends Music therapist: More than 4 times per year    Marital Status: Married  Human resources officer Violence: Not on file    FAMILY HISTORY: Family History  Problem Relation Age of Onset   Breast cancer Mother    Bone cancer Mother    Heart failure Father    Diabetes Mellitus II Father    Hypertension Father    Diabetes Mellitus II Sister    Obesity Sister    Other Sister        retina problem   Hypertension Brother    Diabetes Mellitus II Brother    Colon cancer Neg Hx    Rectal cancer Neg Hx    Stomach cancer Neg Hx    Esophageal cancer Neg Hx    Pancreatic cancer Neg Hx    Liver disease Neg Hx     ALLERGIES:  is allergic to benzonatate, fluticasone furoate-vilanterol, iodine, latex, simvastatin, bupropion, levofloxacin, penicillins, povidone-iodine, amoxicillin, chlorthalidone, diflucan [fluconazole], povidone iodine, amoxicillin-pot clavulanate, and doxycycline.  MEDICATIONS:  Current Outpatient Medications  Medication Sig Dispense Refill   albuterol (PROVENTIL HFA;VENTOLIN HFA) 108 (90 Base) MCG/ACT inhaler Inhale 1-2 puffs into the lungs every 6 (six) hours as needed (Cough).     amLODipine (NORVASC) 10 MG tablet Take 10 mg by mouth daily.     aspirin EC 81 MG tablet Take 1 tablet (81 mg total) by mouth daily. Swallow whole. 90 tablet 3    azelastine (ASTELIN) 0.1 % nasal spray Place 1 spray into both nostrils 2 (two) times daily. Use in each nostril as directed 90 mL 3   b complex vitamins capsule Take 1 capsule by mouth daily.     Calcium-Magnesium-Zinc (CAL-MAG-ZINC PO) Take 2 tablets by mouth daily.     Carboxymethylcellul-Glycerin (REFRESH OPTIVE PF OP) Place 1 drop into both eyes daily as needed (dry eyes). Non-pres     Coenzyme Q10 (CO Q-10) 100 MG CAPS Take 100 mg by mouth daily.     Continuous Blood Gluc Sensor (FREESTYLE LIBRE 2 SENSOR) MISC Replace every 14 days and use to check blood sugar continuously topically change every  14 days to monitor blood glucose continuously for 84 days     desoximetasone (TOPICORT) 0.25 % cream Apply 1 application topically 4 (four) times daily as needed (rash).     diclofenac sodium (VOLTAREN) 1 % GEL Apply 2 g topically 2 (two) times daily as needed (knee pain/ foot /Triger finger).  2   fluticasone (FLONASE) 50 MCG/ACT nasal spray Place 1 spray into both nostrils daily.     gabapentin (NEURONTIN) 300 MG capsule 600 mg daily.     hydrALAZINE (APRESOLINE) 25 MG tablet Take 1 tablet (25 mg total) by mouth 2 (two) times daily. 180 tablet 2   insulin aspart (NOVOLOG FLEXPEN) 100 UNIT/ML FlexPen inject 30 units with each meal Subcutaneous three times daily for 30 days     insulin degludec (TRESIBA FLEXTOUCH) 200 UNIT/ML FlexTouch Pen inject 80 units Subcutaneous once daily for 30 days     irbesartan (AVAPRO) 300 MG tablet Take 300 mg by mouth daily.     metFORMIN (GLUCOPHAGE-XR) 500 MG 24 hr tablet Take 1,000 mg by mouth 2 (two) times daily.     omeprazole (PRILOSEC) 40 MG capsule Take 40 mg by mouth 2 (two) times daily.     rosuvastatin (CRESTOR) 10 MG tablet Take 1 tablet (10 mg total) by mouth daily. (Patient taking differently: Take 10 mg by mouth daily with supper.) 90 tablet 3   sertraline (ZOLOFT) 50 MG tablet Take 50 mg by mouth daily. Takes 1/2 tablet daily     spironolactone  (ALDACTONE) 50 MG tablet Take 1 tablet by mouth once daily 90 tablet 3   terconazole (TERAZOL 3) 0.8 % vaginal cream Place 1 applicator vaginally as needed (yeast infections).      Wheat Dextrin (BENEFIBER PO) Take by mouth. Take 3/4 tsp daily     insulin NPH Human (HUMULIN N,NOVOLIN N) 100 UNIT/ML injection Inject 30 Units into the skin See admin instructions. Inject 30 units subcutaneously at breakfast, 30 units at lunch & 30 units at supper     insulin regular (NOVOLIN R,HUMULIN R) 100 units/mL injection Inject 30 Units into the skin 3 (three) times daily before meals.     No current facility-administered medications for this visit.    REVIEW OF SYSTEMS:   .10 Point review of Systems was done is negative except as noted above.  PHYSICAL EXAMINATION: ECOG PERFORMANCE STATUS: 1 - Symptomatic but completely ambulatory  Vitals:   06/03/22 1249  BP: (!) 143/46  Pulse: 76  Resp: 16  Temp: 97.9 F (36.6 C)  SpO2: 94%   NAD GENERAL:alert, in no acute distress and comfortable SKIN: no acute rashes, no significant lesions EYES: conjunctiva are pink and non-injected, sclera anicteric NECK: supple, no JVD LYMPH:  no palpable lymphadenopathy in the cervical, axillary or inguinal regions LUNGS: clear to auscultation b/l with normal respiratory effort HEART: regular rate & rhythm ABDOMEN:  normoactive bowel sounds , non tender, not distended. Extremity: no pedal edema PSYCH: alert & oriented x 3 with fluent speech NEURO: no focal motor/sensory deficits  LABORATORY DATA:  I have reviewed the data as listed  .    Latest Ref Rng & Units 06/03/2022   11:45 AM 01/04/2022    9:35 AM 10/18/2021   12:22 PM  CBC  WBC 4.0 - 10.5 K/uL 36.4  21.4  18.4   Hemoglobin 12.0 - 15.0 g/dL 8.1  9.7  10.3   Hematocrit 36.0 - 46.0 % 27.9  33.2  33.0   Platelets 150 - 400 K/uL  125  106  100    . CBC    Component Value Date/Time   WBC 36.4 (H) 06/03/2022 1145   WBC 21.4 (H) 01/04/2022 0935   RBC  3.58 (L) 06/03/2022 1145   HGB 8.1 (L) 06/03/2022 1145   HGB 10.3 (L) 10/18/2021 1222   HCT 27.9 (L) 06/03/2022 1145   HCT 33.0 (L) 10/18/2021 1222   PLT 125 (L) 06/03/2022 1145   PLT 100 (LL) 10/18/2021 1222   MCV 77.9 (L) 06/03/2022 1145   MCV 84 10/18/2021 1222   MCH 22.6 (L) 06/03/2022 1145   MCHC 29.0 (L) 06/03/2022 1145   RDW 17.2 (H) 06/03/2022 1145   RDW 15.1 10/18/2021 1222   LYMPHSABS 29.1 (H) 06/03/2022 1145   MONOABS 1.3 (H) 06/03/2022 1145   EOSABS 0.2 06/03/2022 1145   BASOSABS 0.1 06/03/2022 1145     .    Latest Ref Rng & Units 06/03/2022   11:45 AM 01/04/2022    9:35 AM 10/18/2021   12:22 PM  CMP  Glucose 70 - 99 mg/dL 192  176  150   BUN 8 - 23 mg/dL 20  14  11    Creatinine 0.44 - 1.00 mg/dL 0.82  0.81  0.77   Sodium 135 - 145 mmol/L 137  139  137   Potassium 3.5 - 5.1 mmol/L 4.7  4.8  4.9   Chloride 98 - 111 mmol/L 105  107  102   CO2 22 - 32 mmol/L 29  28  24    Calcium 8.9 - 10.3 mg/dL 8.8  9.3  9.1   Total Protein 6.5 - 8.1 g/dL 6.1  6.4    Total Bilirubin 0.3 - 1.2 mg/dL 0.5  0.5    Alkaline Phos 38 - 126 U/L 71  66    AST 15 - 41 U/L 14  15    ALT 0 - 44 U/L 16  17     . Lab Results  Component Value Date   LDH 171 06/03/2022    04/08/2020 FISH/CLL Panel:   03/30/2020 Left Axilla Flow Pathology Report [id number]   03/30/2020 Left axilla lymph node Bx ([id number]):   RADIOGRAPHIC STUDIES: I have personally reviewed the radiological images as listed and agreed with the findings in the report. No results found.  MRI abdomen with and without contrast 07/08/2021 showed IMPRESSION: 1. Moderate motion and patient body habitus degraded exam. 2. Extensive abdominal adenopathy, similar to slightly increased compared to 05/01/2020 PET. 3. Cirrhosis and hepatosplenomegaly with suspicion of periesophageal varices. 4. Nonspecific hypoenhancing or nonenhancing splenic lesions. These could simply represent Crissie Figures bodies in the setting  of cirrhosis. Lymphomatous involvement cannot be excluded. 5. Heterogeneous enhancement throughout the liver, without well-defined suspicious mass. Possibly due to altered perfusion in the setting of cirrhosis and porta hepatis adenopathy. Lymphomatous involvement cannot be excluded. Consider pre and post-contrast MRI follow-up at 2-3 months. A more aggressive approach would include random liver biopsy.  MRI abdomen with and without contrast 02/04/2022 showed IMPRESSION: 1. Examination is generally limited by breath motion artifact, particularly multiphasic contrast enhanced sequences. Within this limitation, cirrhotic morphology of the liver without focal liver lesion. 2. 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 3. Trace ascites. 4. Numerous enlarged gastrohepatic ligament, portacaval, retroperitoneal, and small bowel mesenteric lymph nodes are unchanged, in keeping with history of lymphoma.  ASSESSMENT & PLAN:   71 yo with   1) CLL/SLL -02/28/2020 MM  Breast ([id number]) revealed "Bilateral axillary adenopathy." -03/30/2020 Left axilla lymph node biopsy ([id number]) revealed "CHRONIC LYMPHOCYTIC LYMPHOMA/SMALL LYMPHOCYTIC LYMPHOMA".  -03/30/2020 Left Axilla Flow Pathology Report [id number]) revealed "Monoclonal B-cell population with coexpression of CD5 comprises 80% of all lymphocytes". -04/20/20 PET/CT revealed 1. Adenopathy within the neck, chest, abdomen, and pelvis, consistent with active lymphoma. Much of this is not significantly hypermetabolic. Some upper abdominal nodes are moderately hypermetabolic.  2) Mild thrombocytopenia PLT 124k Most likely related to advanced liver cirrhosis, less likely related to CLL  PLAN: -Discussed pt labwork today, 06/03/2022. CBC shows increased WBC count of 36.4k, hemoglobin of 8.1, platelets of 125k. CMP within normal limits other than  glucose level of 192. LDH 171. -Previous MRI of the abdomen shows liver cirrhosis with hepatosplenomegaly and periesophageal varices.  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   -We discussed her repeat MRI done 02/04/2022 which revealed splenomegaly with numerous unchanged hypoenhancing splenic lesions likely in the setting of cirrhosis. Trace ascities. Multiple lymph nodes that remain unchanged in the context of lymphoma. Otheriwse reviewed as noted above. -Splenic lesions and some element of the hepatomegaly could potentially represent involvement by SLL. -However overall lymphadenopathy and blood counts are only minimally progressive for a fair period of time. -Would recommend repeat MRI imaging with Dr. Carlean Purl as was planned previously. -If there is continued significant increase in hepatomegaly, worsening of liver functions or increasing splenic lesions could consider earlier consideration for treatment of the patient's CLL/SLL. -No acute indication to start treatment with the patient CLL/SLL at this time. -Continue taking B complex -IV Venofer to address anemia.  FOLLOW UP: 1 unit of PRBC in 2-3 days IV Venofer 314m weekly x 3 doses PET/CT in 1 week RTC with Dr KIrene Limbowith labs in 3-4 weeks  The total time spent in the appointment was 30 minutes* .  All of the patient's questions were answered with apparent satisfaction. The patient knows to call the clinic with any problems, questions or concerns.   GSullivan LoneMD MS AAHIVMS SSsm [facility] Center LLCCBeckett SpringsHematology/Oncology Physician CAdvanced [facility] .*Total Encounter Time as defined by the Centers for Medicare and Medicaid Services includes, in addition to the face-to-face time of a patient visit (documented in the note above) non-face-to-face time: obtaining and reviewing outside history, ordering and reviewing medications, tests or procedures, care coordination (communications with other health care  professionals or caregivers) and documentation in the medical record.  I, GMelene Muller am acting as scribe for Dr. GSullivan Lone MD.  .I have reviewed the above documentation for accuracy and completeness, and I agree with the above..Brunetta GeneraMD

## 2022-06-10 ENCOUNTER — Inpatient Hospital Stay: Payer: HMO

## 2022-06-10 ENCOUNTER — Other Ambulatory Visit: Payer: Self-pay

## 2022-06-10 ENCOUNTER — Other Ambulatory Visit: Payer: Self-pay | Admitting: Hematology

## 2022-06-10 ENCOUNTER — Other Ambulatory Visit: Payer: Self-pay | Admitting: *Deleted

## 2022-06-10 ENCOUNTER — Encounter: Payer: Self-pay | Admitting: Hematology

## 2022-06-10 DIAGNOSIS — D696 Thrombocytopenia, unspecified: Secondary | ICD-10-CM

## 2022-06-10 DIAGNOSIS — E119 Type 2 diabetes mellitus without complications: Secondary | ICD-10-CM | POA: Diagnosis not present

## 2022-06-10 DIAGNOSIS — D509 Iron deficiency anemia, unspecified: Secondary | ICD-10-CM | POA: Insufficient documentation

## 2022-06-10 DIAGNOSIS — R82998 Other abnormal findings in urine: Secondary | ICD-10-CM | POA: Diagnosis not present

## 2022-06-10 DIAGNOSIS — C9110 Chronic lymphocytic leukemia of B-cell type not having achieved remission: Secondary | ICD-10-CM

## 2022-06-10 DIAGNOSIS — D649 Anemia, unspecified: Secondary | ICD-10-CM

## 2022-06-10 LAB — CMP (CANCER CENTER ONLY)
ALT: 19 U/L (ref 0–44)
AST: 17 U/L (ref 15–41)
Albumin: 4.4 g/dL (ref 3.5–5.0)
Alkaline Phosphatase: 75 U/L (ref 38–126)
Anion gap: 6 (ref 5–15)
BUN: 16 mg/dL (ref 8–23)
CO2: 27 mmol/L (ref 22–32)
Calcium: 8.8 mg/dL — ABNORMAL LOW (ref 8.9–10.3)
Chloride: 104 mmol/L (ref 98–111)
Creatinine: 0.82 mg/dL (ref 0.44–1.00)
GFR, Estimated: >60 mL/min (ref >60)
Glucose, Bld: 167 mg/dL — ABNORMAL HIGH (ref 70–99)
Potassium: 5.2 mmol/L — ABNORMAL HIGH (ref 3.5–5.1)
Sodium: 137 mmol/L (ref 135–145)
Total Bilirubin: 0.5 mg/dL (ref 0.3–1.2)
Total Protein: 6.4 g/dL — ABNORMAL LOW (ref 6.5–8.1)

## 2022-06-10 LAB — CBC WITH DIFFERENTIAL (CANCER CENTER ONLY)
Abs Immature Granulocytes: 0.2 10*3/uL — ABNORMAL HIGH (ref 0.00–0.07)
Basophils Absolute: 0.1 10*3/uL (ref 0.0–0.1)
Basophils Relative: 0 %
Eosinophils Absolute: 0.2 10*3/uL (ref 0.0–0.5)
Eosinophils Relative: 1 %
HCT: 27.4 % — ABNORMAL LOW (ref 36.0–46.0)
Hemoglobin: 7.9 g/dL — ABNORMAL LOW (ref 12.0–15.0)
Immature Granulocytes: 1 %
Lymphocytes Relative: 83 %
Lymphs Abs: 34 10*3/uL — ABNORMAL HIGH (ref 0.7–4.0)
MCH: 22.2 pg — ABNORMAL LOW (ref 26.0–34.0)
MCHC: 28.8 g/dL — ABNORMAL LOW (ref 30.0–36.0)
MCV: 77 fL — ABNORMAL LOW (ref 80.0–100.0)
Monocytes Absolute: 1 10*3/uL (ref 0.1–1.0)
Monocytes Relative: 2 %
Neutro Abs: 5.4 10*3/uL (ref 1.7–7.7)
Neutrophils Relative %: 13 %
Platelet Count: 124 10*3/uL — ABNORMAL LOW (ref 150–400)
RBC: 3.56 MIL/uL — ABNORMAL LOW (ref 3.87–5.11)
RDW: 17.4 % — ABNORMAL HIGH (ref 11.5–15.5)
WBC Count: 40.9 10*3/uL — ABNORMAL HIGH (ref 4.0–10.5)
nRBC: 0.1 % (ref 0.0–0.2)

## 2022-06-10 LAB — SAMPLE TO BLOOD BANK

## 2022-06-10 LAB — PREPARE RBC (CROSSMATCH)

## 2022-06-10 MED ORDER — SODIUM CHLORIDE 0.9% IV SOLUTION
250.0000 mL | Freq: Once | INTRAVENOUS | Status: AC
  Administered 2022-06-10: 250 mL via INTRAVENOUS

## 2022-06-10 MED ORDER — ACETAMINOPHEN 325 MG PO TABS
650.0000 mg | ORAL_TABLET | Freq: Once | ORAL | Status: AC
  Administered 2022-06-10: 650 mg via ORAL
  Filled 2022-06-10: qty 2

## 2022-06-10 MED ORDER — DIPHENHYDRAMINE HCL 25 MG PO CAPS
25.0000 mg | ORAL_CAPSULE | Freq: Once | ORAL | Status: AC
  Administered 2022-06-10: 25 mg via ORAL
  Filled 2022-06-10: qty 1

## 2022-06-10 NOTE — Patient Instructions (Signed)

## 2022-06-11 LAB — TYPE AND SCREEN
ABO/RH(D): A POS
Antibody Screen: NEGATIVE
Unit division: 0

## 2022-06-11 LAB — BPAM RBC
Blood Product Expiration Date: 202310232359
ISSUE DATE / TIME: 202309291102
Unit Type and Rh: 6200

## 2022-06-14 ENCOUNTER — Encounter: Payer: Self-pay | Admitting: Hematology

## 2022-06-20 ENCOUNTER — Other Ambulatory Visit: Payer: Self-pay | Admitting: Hematology

## 2022-06-20 MED ORDER — LORAZEPAM 0.5 MG PO TABS: 0.5000 mg | ORAL_TABLET | Freq: Once | ORAL | 0 refills | Status: DC | PRN

## 2022-06-21 ENCOUNTER — Other Ambulatory Visit: Payer: Self-pay

## 2022-06-21 ENCOUNTER — Inpatient Hospital Stay: Payer: HMO | Attending: Hematology

## 2022-06-21 VITALS — BP 112/52 | HR 68 | Temp 97.8°F | Resp 18

## 2022-06-21 DIAGNOSIS — C9110 Chronic lymphocytic leukemia of B-cell type not having achieved remission: Secondary | ICD-10-CM | POA: Insufficient documentation

## 2022-06-21 DIAGNOSIS — D508 Other iron deficiency anemias: Secondary | ICD-10-CM

## 2022-06-21 DIAGNOSIS — D509 Iron deficiency anemia, unspecified: Secondary | ICD-10-CM | POA: Diagnosis present

## 2022-06-21 MED ORDER — SODIUM CHLORIDE 0.9 % IV SOLN: Freq: Once | INTRAVENOUS | Status: AC

## 2022-06-21 MED ORDER — LORATADINE 10 MG PO TABS
10.0000 mg | ORAL_TABLET | Freq: Once | ORAL | Status: AC
  Administered 2022-06-21: 10 mg via ORAL
  Filled 2022-06-21: qty 1

## 2022-06-21 MED ORDER — ACETAMINOPHEN 325 MG PO TABS
650.0000 mg | ORAL_TABLET | Freq: Once | ORAL | Status: AC
  Administered 2022-06-21: 650 mg via ORAL
  Filled 2022-06-21: qty 2

## 2022-06-21 MED ORDER — SODIUM CHLORIDE 0.9 % IV SOLN
300.0000 mg | Freq: Once | INTRAVENOUS | Status: AC
  Administered 2022-06-21: 300 mg via INTRAVENOUS
  Filled 2022-06-21: qty 300

## 2022-06-21 NOTE — Patient Instructions (Signed)

## 2022-06-22 ENCOUNTER — Encounter (HOSPITAL_COMMUNITY)
Admission: RE | Admit: 2022-06-22 | Discharge: 2022-06-22 | Disposition: A | Discharge status: Home / Self Care | Payer: HMO | Source: Ambulatory Visit | Attending: Hematology | Admitting: Hematology

## 2022-06-22 DIAGNOSIS — C8300 Small cell B-cell lymphoma, unspecified site: Secondary | ICD-10-CM | POA: Insufficient documentation

## 2022-06-22 DIAGNOSIS — C9110 Chronic lymphocytic leukemia of B-cell type not having achieved remission: Secondary | ICD-10-CM | POA: Insufficient documentation

## 2022-06-22 LAB — GLUCOSE, CAPILLARY: Glucose-Capillary: 144 mg/dL — ABNORMAL HIGH (ref 70–99)

## 2022-06-22 MED ORDER — FLUDEOXYGLUCOSE F - 18 (FDG) INJECTION
12.0000 | Freq: Once | INTRAVENOUS | Status: AC
  Administered 2022-06-22: 11.22 via INTRAVENOUS

## 2022-06-25 ENCOUNTER — Other Ambulatory Visit: Payer: Self-pay | Admitting: Physician Assistant

## 2022-06-28 ENCOUNTER — Inpatient Hospital Stay: Payer: HMO

## 2022-06-28 ENCOUNTER — Other Ambulatory Visit: Payer: Self-pay

## 2022-06-28 VITALS — BP 112/52 | HR 68 | Temp 97.9°F | Resp 18

## 2022-06-28 DIAGNOSIS — D508 Other iron deficiency anemias: Secondary | ICD-10-CM

## 2022-06-28 DIAGNOSIS — D509 Iron deficiency anemia, unspecified: Secondary | ICD-10-CM | POA: Diagnosis not present

## 2022-06-28 MED ORDER — SODIUM CHLORIDE 0.9 % IV SOLN
300.0000 mg | Freq: Once | INTRAVENOUS | Status: AC
  Administered 2022-06-28: 300 mg via INTRAVENOUS
  Filled 2022-06-28: qty 300

## 2022-06-28 MED ORDER — SODIUM CHLORIDE 0.9 % IV SOLN: Freq: Once | INTRAVENOUS | Status: AC

## 2022-06-28 MED ORDER — LORATADINE 10 MG PO TABS
10.0000 mg | ORAL_TABLET | Freq: Once | ORAL | Status: AC
  Administered 2022-06-28: 10 mg via ORAL
  Filled 2022-06-28: qty 1

## 2022-06-28 MED ORDER — ACETAMINOPHEN 325 MG PO TABS
650.0000 mg | ORAL_TABLET | Freq: Once | ORAL | Status: AC
  Administered 2022-06-28: 650 mg via ORAL
  Filled 2022-06-28: qty 2

## 2022-06-28 NOTE — Patient Instructions (Signed)

## 2022-06-28 NOTE — Progress Notes (Signed)
Pt declined to be observed for 30 minutes post Venofer infusion. Pt tolerated trtmt well w/out incident. VSS at discharge.  Ambulatory w/ cane to lobby.

## 2022-07-05 ENCOUNTER — Ambulatory Visit: Payer: HMO

## 2022-07-05 ENCOUNTER — Other Ambulatory Visit: Payer: Self-pay

## 2022-07-05 ENCOUNTER — Inpatient Hospital Stay: Payer: HMO

## 2022-07-05 VITALS — BP 113/66 | HR 89 | Temp 97.6°F | Resp 18

## 2022-07-05 DIAGNOSIS — D508 Other iron deficiency anemias: Secondary | ICD-10-CM

## 2022-07-05 DIAGNOSIS — D509 Iron deficiency anemia, unspecified: Secondary | ICD-10-CM | POA: Diagnosis not present

## 2022-07-05 MED ORDER — LORATADINE 10 MG PO TABS
10.0000 mg | ORAL_TABLET | Freq: Once | ORAL | Status: AC
  Administered 2022-07-05: 10 mg via ORAL
  Filled 2022-07-05: qty 1

## 2022-07-05 MED ORDER — SODIUM CHLORIDE 0.9 % IV SOLN
300.0000 mg | Freq: Once | INTRAVENOUS | Status: AC
  Administered 2022-07-05: 300 mg via INTRAVENOUS
  Filled 2022-07-05: qty 300

## 2022-07-05 MED ORDER — SODIUM CHLORIDE 0.9 % IV SOLN: Freq: Once | INTRAVENOUS | Status: AC

## 2022-07-05 MED ORDER — ACETAMINOPHEN 325 MG PO TABS
650.0000 mg | ORAL_TABLET | Freq: Once | ORAL | Status: AC
  Administered 2022-07-05: 650 mg via ORAL
  Filled 2022-07-05: qty 2

## 2022-07-05 NOTE — Patient Instructions (Signed)

## 2022-07-08 ENCOUNTER — Other Ambulatory Visit: Payer: HMO

## 2022-07-08 ENCOUNTER — Encounter (INDEPENDENT_AMBULATORY_CARE_PROVIDER_SITE_OTHER): Payer: HMO | Admitting: Ophthalmology

## 2022-07-08 ENCOUNTER — Ambulatory Visit: Payer: HMO | Admitting: Hematology

## 2022-07-08 DIAGNOSIS — H35033 Hypertensive retinopathy, bilateral: Secondary | ICD-10-CM | POA: Diagnosis not present

## 2022-07-08 DIAGNOSIS — E113211 Type 2 diabetes mellitus with mild nonproliferative diabetic retinopathy with macular edema, right eye: Secondary | ICD-10-CM | POA: Diagnosis not present

## 2022-07-08 DIAGNOSIS — E113392 Type 2 diabetes mellitus with moderate nonproliferative diabetic retinopathy without macular edema, left eye: Secondary | ICD-10-CM

## 2022-07-08 DIAGNOSIS — H43813 Vitreous degeneration, bilateral: Secondary | ICD-10-CM

## 2022-07-08 DIAGNOSIS — I10 Essential (primary) hypertension: Secondary | ICD-10-CM | POA: Diagnosis not present

## 2022-07-11 ENCOUNTER — Other Ambulatory Visit: Payer: Self-pay

## 2022-07-11 DIAGNOSIS — C9110 Chronic lymphocytic leukemia of B-cell type not having achieved remission: Secondary | ICD-10-CM

## 2022-07-12 ENCOUNTER — Other Ambulatory Visit: Payer: Self-pay

## 2022-07-12 ENCOUNTER — Inpatient Hospital Stay: Payer: HMO

## 2022-07-12 ENCOUNTER — Inpatient Hospital Stay: Payer: HMO | Admitting: Hematology

## 2022-07-12 VITALS — BP 131/60 | HR 81 | Temp 98.9°F | Resp 17 | Ht 64.5 in | Wt 230.3 lb

## 2022-07-12 DIAGNOSIS — D509 Iron deficiency anemia, unspecified: Secondary | ICD-10-CM | POA: Diagnosis not present

## 2022-07-12 DIAGNOSIS — C9110 Chronic lymphocytic leukemia of B-cell type not having achieved remission: Secondary | ICD-10-CM | POA: Diagnosis not present

## 2022-07-12 LAB — CBC WITH DIFFERENTIAL (CANCER CENTER ONLY)
Abs Immature Granulocytes: 0.15 10*3/uL — ABNORMAL HIGH (ref 0.00–0.07)
Basophils Absolute: 0.1 10*3/uL (ref 0.0–0.1)
Basophils Relative: 0 %
Eosinophils Absolute: 0.2 10*3/uL (ref 0.0–0.5)
Eosinophils Relative: 1 %
HCT: 33.8 % — ABNORMAL LOW (ref 36.0–46.0)
Hemoglobin: 10 g/dL — ABNORMAL LOW (ref 12.0–15.0)
Immature Granulocytes: 0 %
Lymphocytes Relative: 81 %
Lymphs Abs: 27.3 10*3/uL — ABNORMAL HIGH (ref 0.7–4.0)
MCH: 24.7 pg — ABNORMAL LOW (ref 26.0–34.0)
MCHC: 29.6 g/dL — ABNORMAL LOW (ref 30.0–36.0)
MCV: 83.5 fL (ref 80.0–100.0)
Monocytes Absolute: 0.8 10*3/uL (ref 0.1–1.0)
Monocytes Relative: 3 %
Neutro Abs: 4.9 10*3/uL (ref 1.7–7.7)
Neutrophils Relative %: 15 %
Platelet Count: 97 10*3/uL — ABNORMAL LOW (ref 150–400)
RBC: 4.05 MIL/uL (ref 3.87–5.11)
RDW: 22.9 % — ABNORMAL HIGH (ref 11.5–15.5)
WBC Count: 33.6 10*3/uL — ABNORMAL HIGH (ref 4.0–10.5)
nRBC: 0.1 % (ref 0.0–0.2)

## 2022-07-12 LAB — LACTATE DEHYDROGENASE: LDH: 176 U/L (ref 98–192)

## 2022-07-12 LAB — CMP (CANCER CENTER ONLY)
ALT: 20 U/L (ref 0–44)
AST: 17 U/L (ref 15–41)
Albumin: 4.4 g/dL (ref 3.5–5.0)
Alkaline Phosphatase: 71 U/L (ref 38–126)
Anion gap: 5 (ref 5–15)
BUN: 15 mg/dL (ref 8–23)
CO2: 28 mmol/L (ref 22–32)
Calcium: 9.1 mg/dL (ref 8.9–10.3)
Chloride: 102 mmol/L (ref 98–111)
Creatinine: 0.82 mg/dL (ref 0.44–1.00)
GFR, Estimated: >60 mL/min (ref >60)
Glucose, Bld: 172 mg/dL — ABNORMAL HIGH (ref 70–99)
Potassium: 5.1 mmol/L (ref 3.5–5.1)
Sodium: 135 mmol/L (ref 135–145)
Total Bilirubin: 0.5 mg/dL (ref 0.3–1.2)
Total Protein: 6.4 g/dL — ABNORMAL LOW (ref 6.5–8.1)

## 2022-07-12 LAB — SAMPLE TO BLOOD BANK

## 2022-07-12 NOTE — Progress Notes (Signed)
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  INTERVAL HISTORY: Janice [name] is a 71 y.o. 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 She reports She is doing well with no new symptoms or concerns.  She reports significant fatigue   She notes burning pain sensation in left flank. We discussed continued f/u with Dr. Carlean Purl for liver cirrhosis.  She also notes some slight enlargement in nodules in cervical region in addition to a burning pain sensation.  No fever, chills, night sweats. No new lumps, bumps, or lesions/rashes. No abdominal pain or change in bowel habits. No new or unexpected weight loss. No SOB or chest pain. No bleeding issues. No blood in stools. No other new or acute focal symptoms.  Discussed pt labwork today, 07/12/2022  MEDICAL HISTORY:  Past Medical History:  Diagnosis Date   Allergy    Asthma    seasonal   Cellulitis 2013   Left toe   Cirrhosis ([place])    Common migraine    History of   Complication of anesthesia    Per pt/had breathing problems with "block" during rotator cuff surgery. 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   DDD (degenerative disc disease)    Depression    denies takes paxil for migraines   Diabetes mellitus    type 2   Diabetic peripheral neuropathy (HCC)    Diverticulitis    Diverticulosis    DJD (degenerative joint disease)    Esophageal varices (HCC)    GERD (gastroesophageal reflux disease)    History of colon polyps    hyperplastic   History of gastric polyp    Hyperlipidemia    Hypertension    Iron deficiency anemia    Obesity    OSA on CPAP    cpap   Peripheral neuropathy    Pneumonia    april  2020  mild   Primary localized osteoarthritis of left knee 03/27/2019   Sensorineural hearing loss    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[place])     SURGICAL HISTORY: Past Surgical History:  Procedure Laterality Date   ANKLE SURGERY     Left    COLONOSCOPY  05/2018   LEEP N/A 09/14/2018   Procedure: LOOP ELECTROSURGICAL EXCISION PROCEDURE (LEEP);  Surgeon: Arvella Nigh, MD;  Location: [facility];  Service: Gynecology;  Laterality: N/A;   MASS EXCISION Right 06/24/2021   Procedure: EXCISION RIGHT POSTERIOR NECK MASS;  Surgeon: Kinsinger, [name], MD;  Location: [facility];  Service: General;  Laterality: Right;   ROTATOR CUFF REPAIR Bilateral 2012, 2015   TONSILLECTOMY     TOTAL KNEE ARTHROPLASTY Left 04/08/2019   Procedure: TOTAL KNEE ARTHROPLASTY;  Surgeon: Elsie Saas, MD;  Location: [facility];  Service: Orthopedics;  Laterality: Left;   UPPER GI ENDOSCOPY  05/2018    SOCIAL HISTORY: Social History   Socioeconomic History   Marital status: Widowed    Spouse name: Not on file   Number of children: 0   Years of education: 3 years college   Highest education level: Not on file  Occupational History   Occupation: Unemployed  Tobacco Use   Smoking status: Never   Smokeless tobacco: Never  Vaping Use   Vaping Use: Never used  Substance and Sexual Activity   Alcohol use: Not Currently    Comment: very rare   Drug use: No   Sexual activity: Not Currently    Birth control/protection: None, Post-menopausal  Other Topics Concern   Not on file  Social History Narrative   1 caffeine drink daily    Right-handed   widow (husband died from [place] cirrhosis)   Social Determinants of Health   Financial Resource Strain: Medium Risk (08/20/2020)   Overall Financial Resource Strain (CARDIA)    Difficulty of Paying Living Expenses: Somewhat hard  Food Insecurity: No Food Insecurity (08/20/2020)   Hunger Vital Sign    Worried About Running Out of Food in the Last Year: Never true     Ran Out of Food in the Last Year: Never true  Transportation Needs: No Transportation Needs (08/20/2020)   PRAPARE - Hydrologist (Medical): No    Lack of Transportation (Non-Medical): No  Physical Activity: Not on file  Stress: Not on file  Social Connections: Socially Integrated (08/20/2020)   Social Connection and Isolation Panel [NHANES]    Frequency of Communication with Friends and Family: More than three times a week    Frequency of Social Gatherings with Friends and Family: More than three times a week    Attends Religious Services: More than 4 times per year    Active Member of [employer] or Organizations: Yes    Attends Music therapist: More than 4 times per year    Marital Status: Married  Human resources officer Violence: Not on file    FAMILY HISTORY: Family History  Problem Relation Age of Onset   Breast cancer Mother    Bone cancer Mother    Heart failure Father    Diabetes Mellitus II Father    Hypertension Father    Diabetes Mellitus II Sister    Obesity Sister    Other Sister        retina problem   Hypertension Brother    Diabetes Mellitus II Brother    Colon cancer Neg Hx    Rectal cancer Neg Hx    Stomach cancer Neg Hx    Esophageal cancer Neg Hx    Pancreatic cancer Neg Hx    Liver disease Neg Hx     ALLERGIES:  is allergic to benzonatate, fluticasone furoate-vilanterol, iodine, latex, simvastatin, bupropion, levofloxacin, penicillins, povidone-iodine, amoxicillin, chlorthalidone, diflucan [fluconazole], povidone iodine, amoxicillin-pot clavulanate, and doxycycline.  MEDICATIONS:  Current Outpatient Medications  Medication Sig Dispense Refill   albuterol (PROVENTIL HFA;VENTOLIN HFA) 108 (90 Base) MCG/ACT inhaler Inhale 1-2 puffs into the lungs every 6 (six) hours as needed (Cough).     amLODipine (NORVASC) 10 MG tablet Take 10 mg by mouth daily.     aspirin EC 81 MG tablet Take 1 tablet (81 mg total) by mouth daily.  Swallow whole. 90 tablet 3   azelastine (ASTELIN) 0.1 % nasal spray Place 1 spray into both nostrils 2 (two) times daily. Use in each nostril as directed 90 mL 3   b complex vitamins capsule Take 1 capsule by mouth daily.     Calcium-Magnesium-Zinc (CAL-MAG-ZINC PO) Take 2 tablets by mouth daily.     Carboxymethylcellul-Glycerin (REFRESH OPTIVE PF OP) Place 1 drop into both eyes daily as needed (dry eyes). Non-pres     Coenzyme Q10 (CO Q-10) 100 MG CAPS Take 100 mg by mouth daily.     Continuous Blood Gluc Sensor (FREESTYLE LIBRE 2 SENSOR) MISC Replace  every 14 days and use to check blood sugar continuously topically change every 14 days to monitor blood glucose continuously for 84 days     desoximetasone (TOPICORT) 0.25 % cream Apply 1 application topically 4 (four) times daily as needed (rash).     diclofenac sodium (VOLTAREN) 1 % GEL Apply 2 g topically 2 (two) times daily as needed (knee pain/ foot /Triger finger).  2   fluticasone (FLONASE) 50 MCG/ACT nasal spray Place 1 spray into both nostrils daily.     gabapentin (NEURONTIN) 300 MG capsule 600 mg daily.     hydrALAZINE (APRESOLINE) 25 MG tablet Take 1 tablet (25 mg total) by mouth 2 (two) times daily. 180 tablet 2   insulin aspart (NOVOLOG FLEXPEN) 100 UNIT/ML FlexPen inject 30 units with each meal Subcutaneous three times daily for 30 days     insulin degludec (TRESIBA FLEXTOUCH) 200 UNIT/ML FlexTouch Pen inject 80 units Subcutaneous once daily for 30 days     irbesartan (AVAPRO) 300 MG tablet Take 300 mg by mouth daily.     LORazepam (ATIVAN) 0.5 MG tablet Take 1-2 tablets (0.5-1 mg total) by mouth once as needed for up to 1 dose (prior to MRI or CT scan for claustrophobia). 4 tablet 0   metFORMIN (GLUCOPHAGE-XR) 500 MG 24 hr tablet Take 1,000 mg by mouth 2 (two) times daily.     omeprazole (PRILOSEC) 40 MG capsule Take 40 mg by mouth 2 (two) times daily.     rosuvastatin (CRESTOR) 10 MG tablet Take 1 tablet (10 mg total) by mouth daily.  (Patient taking differently: Take 10 mg by mouth daily with supper.) 90 tablet 3   sertraline (ZOLOFT) 50 MG tablet Take 50 mg by mouth daily. Takes 1/2 tablet daily     spironolactone (ALDACTONE) 50 MG tablet Take 1 tablet by mouth once daily 90 tablet 0   terconazole (TERAZOL 3) 0.8 % vaginal cream Place 1 applicator vaginally as needed (yeast infections).      Wheat Dextrin (BENEFIBER PO) Take by mouth. Take 3/4 tsp daily     insulin NPH Human (HUMULIN N,NOVOLIN N) 100 UNIT/ML injection Inject 30 Units into the skin See admin instructions. Inject 30 units subcutaneously at breakfast, 30 units at lunch & 30 units at supper     insulin regular (NOVOLIN R,HUMULIN R) 100 units/mL injection Inject 30 Units into the skin 3 (three) times daily before meals.     No current facility-administered medications for this visit.    REVIEW OF SYSTEMS:   .10 Point review of Systems was done is negative except as noted above.  PHYSICAL EXAMINATION: ECOG PERFORMANCE STATUS: 1 - Symptomatic but completely ambulatory  Vitals:   07/12/22 1120  BP: 131/60  Pulse: 81  Resp: 17  Temp: 98.9 F (37.2 C)  SpO2: 98%   NAD GENERAL:alert, in no acute distress and comfortable SKIN: no acute rashes, no significant lesions EYES: conjunctiva are pink and non-injected, sclera anicteric NECK: supple, no JVD LYMPH:  small palpable LNadenopathy b/l neck and axillary regions. LUNGS: clear to auscultation b/l with normal respiratory effort HEART: regular rate & rhythm ABDOMEN:  normoactive bowel sounds , non tender, not distended. Extremity: no pedal edema PSYCH: alert & oriented x 3 with fluent speech NEURO: no focal motor/sensory deficits  Exam performed in chair.  LABORATORY DATA:  I have reviewed the data as listed  .    Latest Ref Rng & Units 07/12/2022   10:52 AM 06/10/2022    8:51 AM  06/03/2022   11:45 AM  CBC  WBC 4.0 - 10.5 K/uL 33.6  40.9  36.4   Hemoglobin 12.0 - 15.0 g/dL 10.0  7.9  8.1    Hematocrit 36.0 - 46.0 % 33.8  27.4  27.9   Platelets 150 - 400 K/uL 97  124  125    . CBC    Component Value Date/Time   WBC 33.6 (H) 07/12/2022 1052   WBC 21.4 (H) 01/04/2022 0935   RBC 4.05 07/12/2022 1052   HGB 10.0 (L) 07/12/2022 1052   HGB 10.3 (L) 10/18/2021 1222   HCT 33.8 (L) 07/12/2022 1052   HCT 33.0 (L) 10/18/2021 1222   PLT 97 (L) 07/12/2022 1052   PLT 100 (LL) 10/18/2021 1222   MCV 83.5 07/12/2022 1052   MCV 84 10/18/2021 1222   MCH 24.7 (L) 07/12/2022 1052   MCHC 29.6 (L) 07/12/2022 1052   RDW 22.9 (H) 07/12/2022 1052   RDW 15.1 10/18/2021 1222   LYMPHSABS 27.3 (H) 07/12/2022 1052   MONOABS 0.8 07/12/2022 1052   EOSABS 0.2 07/12/2022 1052   BASOSABS 0.1 07/12/2022 1052     .    Latest Ref Rng & Units 07/12/2022   10:52 AM 06/10/2022    8:51 AM 06/03/2022   11:45 AM  CMP  Glucose 70 - 99 mg/dL 172  167  192   BUN 8 - 23 mg/dL 15  16  20    Creatinine 0.44 - 1.00 mg/dL 0.82  0.82  0.82   Sodium 135 - 145 mmol/L 135  137  137   Potassium 3.5 - 5.1 mmol/L 5.1  5.2  4.7   Chloride 98 - 111 mmol/L 102  104  105   CO2 22 - 32 mmol/L 28  27  29    Calcium 8.9 - 10.3 mg/dL 9.1  8.8  8.8   Total Protein 6.5 - 8.1 g/dL 6.4  6.4  6.1   Total Bilirubin 0.3 - 1.2 mg/dL 0.5  0.5  0.5   Alkaline Phos 38 - 126 U/L 71  75  71   AST 15 - 41 U/L 17  17  14    ALT 0 - 44 U/L 20  19  16     . Lab Results  Component Value Date   LDH 171 06/03/2022    04/08/2020 FISH/CLL Panel:   03/30/2020 Left Axilla Flow Pathology Report [id number]):   03/30/2020 Left axilla lymph node Bx ([id number]):   RADIOGRAPHIC STUDIES: I have personally reviewed the radiological images as listed and agreed with the findings in the report. NM PET Image Initial (PI) Skull Base To Thigh  Result Date: 06/23/2022 CLINICAL DATA:  Initial treatment strategy for hematologic malignancy. Staging CLL/small lymphocytic lymphoma. EXAM: NUCLEAR MEDICINE PET SKULL BASE TO THIGH TECHNIQUE: 11.22  mCi F-18 FDG was injected intravenously. Full-ring PET imaging was performed from the skull base to thigh after the radiotracer. CT data was obtained and used for attenuation correction and anatomic localization. Fasting blood glucose: 146 mg/dl COMPARISON:  Abdominal MRI 02/04/2022.  PET-CT 05/01/2020. FINDINGS: Mediastinal blood pool activity: SUV max 2.3 Liver activity: SUV max 4.4 NECK: Compared with the previous PET-CT, there is progressive enlargement of numerous cervical lymph nodes bilaterally, demonstrating only low level hypermetabolic activity. There is a 1.1 cm level II node on the left (image 30/4) which has an SUV max of 2.3.No suspicious activity identified within the pharyngeal mucosal space. Incidental CT findings: Bilateral carotid atherosclerosis. CHEST: Progressive thoracic adenopathy, especially in both axillary  regions. Representative nodes include a right axillary node measuring 2.2 cm on image 56/4 (SUV max 4.1) and a 1.4 cm left axillary node on image 59/4 (SUV max 2.6). The mediastinal and hilar lymph nodes are not significantly enlarged, and demonstrate no hypermetabolic activity. No hypermetabolic pulmonary activity or suspicious nodularity. Incidental CT findings: Aortic and branch vessel atherosclerosis. Peripheral lung scarring and subpleural nodularity appears unchanged. ABDOMEN/PELVIS: There is no hypermetabolic activity within the liver, adrenal glands, spleen or pancreas. The spleen remains moderately enlarged, measuring up to 17.3 cm in length. No significant hypermetabolic activity (SUV max 3.5). There are numerous enlarged lymph nodes in the porta hepatis, base of mesentery and pelvis which have enlarged from the previous PET-CT. These nodes demonstrate only low level metabolic activity. For example, a pre aortic node measuring 2.1 cm on image 137/4 has an SUV max of 3.9. A 2.3 cm left external iliac node on image 178/4 has an SUV max of 2.5. Bowel activity within physiologic  limits. Incidental CT findings: Morphologic changes of cirrhosis are again noted. Moderate diverticulosis of the descending and sigmoid colon. There is a small amount of free pelvic fluid which is similar to the prior examination. SKELETON: There is no hypermetabolic activity to suggest osseous metastatic disease. Incidental CT findings: Prominent facet hypertrophy in the lower lumbar spine. Stable lipoma anteriorly in the proximal right thigh. IMPRESSION: 1. Compared with the previous PET-CT of 05/01/2020, there is progressive enlargement of multiple lymph nodes in the neck, chest, abdomen and pelvis, demonstrating only low level hypermetabolic activity (predominantly Deauville 3). 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 2. Stable splenomegaly without significant hypermetabolic activity, nonspecific in light of the patient's cirrhosis and portal hypertension. 3. No evidence of osseous metastatic disease. 4. Stable incidental findings including distal colonic diverticulosis, cirrhosis and a small amount of free pelvic fluid. 5.  Aortic Atherosclerosis (ICD10-I70.0). Electronically Signed   By: Richardean Sale M.D.   On: 06/23/2022 10:34    MRI abdomen with and without contrast 07/08/2021 showed IMPRESSION: 1. Moderate motion and patient body habitus degraded exam. 2. Extensive abdominal adenopathy, similar to slightly increased compared to 05/01/2020 PET. 3. Cirrhosis and hepatosplenomegaly with suspicion of periesophageal varices. 4. Nonspecific hypoenhancing or nonenhancing splenic lesions. These could simply represent Crissie Figures bodies in the setting of cirrhosis. Lymphomatous involvement cannot be excluded. 5. Heterogeneous enhancement throughout the liver, without well-defined suspicious mass. Possibly due to altered perfusion in the setting of cirrhosis and porta hepatis adenopathy. Lymphomatous involvement cannot be excluded. Consider pre and post-contrast MRI follow-up at 2-3 months. A more  aggressive approach would include random liver biopsy.  MRI abdomen with and without contrast 02/04/2022 showed IMPRESSION: 1. Examination is generally limited by breath motion artifact, particularly multiphasic contrast enhanced sequences. Within this limitation, cirrhotic morphology of the liver without focal liver lesion. 2. 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 3. Trace ascites. 4. Numerous enlarged gastrohepatic ligament, portacaval, retroperitoneal, and small bowel mesenteric lymph nodes are unchanged, in keeping with history of lymphoma.  ASSESSMENT & PLAN:   71 yo with   1) CLL/SLL -02/28/2020 MM Breast ([id number]) revealed "Bilateral axillary adenopathy." -03/30/2020 Left axilla lymph node biopsy ([id number]) revealed "CHRONIC LYMPHOCYTIC LYMPHOMA/SMALL LYMPHOCYTIC LYMPHOMA".  -03/30/2020 Left Axilla Flow Pathology Report [id number]) revealed "Monoclonal B-cell population with coexpression of CD5 comprises 80% of all lymphocytes". -04/20/20 PET/CT revealed 1. Adenopathy within the neck, chest, abdomen, and pelvis, consistent with active lymphoma. Much  of this is not significantly hypermetabolic. Some upper abdominal nodes are moderately hypermetabolic.  2) Mild thrombocytopenia PLT 124k Most likely related to advanced liver cirrhosis, less likely related to CLL  PLAN: -Discussed pt labwork today, was discussed in details with the patient. -CBC with hgb showing hgb of 10 with WBC count of 33.6k and PLT of 97k CMP stable LDH wnl at 176 PET/CT from  06/22/2022 discussed in details with the patient .Shows some progressive enlargement of LN but no bulky or organ threatening disease. Stable splenomegaly. -no acute indication to initiated CLL/SLL treatment at this time. -continue B complex  FOLLOW UP: -RTC with Dr Irene Limbo with labs in 10 weeks   The total time spent in  the appointment was 30 minutes* .  All of the patient's questions were answered with apparent satisfaction. The patient knows to call the clinic with any problems, questions or concerns.   Sullivan Lone MD MS AAHIVMS [facility] Hematology/Oncology Physician [facility]  .*Total Encounter Time as defined by the Centers for Medicare and Medicaid Services includes, in addition to the face-to-face time of a patient visit (documented in the note above) non-face-to-face time: obtaining and reviewing outside history, ordering and reviewing medications, tests or procedures, care coordination (communications with other health care professionals or caregivers) and documentation in the medical record.  I, Melene Muller, am acting as scribe for Dr. Sullivan Lone, MD.  .I have reviewed the above documentation for accuracy and completeness, and I agree with the above. Brunetta Genera MD

## 2022-07-14 ENCOUNTER — Encounter: Payer: Self-pay | Admitting: Hematology

## 2022-07-19 ENCOUNTER — Ambulatory Visit: Payer: PPO | Admitting: Internal Medicine

## 2022-07-19 ENCOUNTER — Encounter: Payer: Self-pay | Admitting: Hematology

## 2022-07-20 ENCOUNTER — Encounter (HOSPITAL_COMMUNITY): Payer: Self-pay | Admitting: *Deleted

## 2022-07-20 ENCOUNTER — Other Ambulatory Visit: Payer: Self-pay

## 2022-07-20 ENCOUNTER — Emergency Department (HOSPITAL_COMMUNITY)
Admission: EM | Admit: 2022-07-20 | Discharge: 2022-07-21 | Disposition: A | Discharge status: Home / Self Care | Payer: HMO | Attending: Emergency Medicine | Admitting: Emergency Medicine

## 2022-07-20 ENCOUNTER — Emergency Department (HOSPITAL_COMMUNITY): Payer: HMO

## 2022-07-20 DIAGNOSIS — Z91040 Latex allergy status: Secondary | ICD-10-CM | POA: Insufficient documentation

## 2022-07-20 DIAGNOSIS — Z20822 Contact with and (suspected) exposure to covid-19: Secondary | ICD-10-CM | POA: Diagnosis not present

## 2022-07-20 DIAGNOSIS — Z856 Personal history of leukemia: Secondary | ICD-10-CM | POA: Insufficient documentation

## 2022-07-20 DIAGNOSIS — R059 Cough, unspecified: Secondary | ICD-10-CM | POA: Diagnosis present

## 2022-07-20 DIAGNOSIS — J40 Bronchitis, not specified as acute or chronic: Secondary | ICD-10-CM | POA: Insufficient documentation

## 2022-07-20 DIAGNOSIS — Z7982 Long term (current) use of aspirin: Secondary | ICD-10-CM | POA: Insufficient documentation

## 2022-07-20 LAB — CBC WITH DIFFERENTIAL/PLATELET
Abs Immature Granulocytes: 0.29 10*3/uL — ABNORMAL HIGH (ref 0.00–0.07)
Basophils Absolute: 0.1 10*3/uL (ref 0.0–0.1)
Basophils Relative: 0 %
Eosinophils Absolute: 0.2 10*3/uL (ref 0.0–0.5)
Eosinophils Relative: 1 %
HCT: 32.5 % — ABNORMAL LOW (ref 36.0–46.0)
Hemoglobin: 9.7 g/dL — ABNORMAL LOW (ref 12.0–15.0)
Immature Granulocytes: 1 %
Lymphocytes Relative: 72 %
Lymphs Abs: 22.4 10*3/uL — ABNORMAL HIGH (ref 0.7–4.0)
MCH: 25.3 pg — ABNORMAL LOW (ref 26.0–34.0)
MCHC: 29.8 g/dL — ABNORMAL LOW (ref 30.0–36.0)
MCV: 84.6 fL (ref 80.0–100.0)
Monocytes Absolute: 1 10*3/uL (ref 0.1–1.0)
Monocytes Relative: 3 %
Neutro Abs: 7 10*3/uL (ref 1.7–7.7)
Neutrophils Relative %: 23 %
Platelets: 97 10*3/uL — ABNORMAL LOW (ref 150–400)
RBC: 3.84 MIL/uL — ABNORMAL LOW (ref 3.87–5.11)
RDW: 22.9 % — ABNORMAL HIGH (ref 11.5–15.5)
WBC: 31 10*3/uL — ABNORMAL HIGH (ref 4.0–10.5)
nRBC: 0 % (ref 0.0–0.2)

## 2022-07-20 LAB — COMPREHENSIVE METABOLIC PANEL
ALT: 23 U/L (ref 0–44)
AST: 21 U/L (ref 15–41)
Albumin: 4 g/dL (ref 3.5–5.0)
Alkaline Phosphatase: 80 U/L (ref 38–126)
Anion gap: 10 (ref 5–15)
BUN: 13 mg/dL (ref 8–23)
CO2: 24 mmol/L (ref 22–32)
Calcium: 8.6 mg/dL — ABNORMAL LOW (ref 8.9–10.3)
Chloride: 101 mmol/L (ref 98–111)
Creatinine, Ser: 0.69 mg/dL (ref 0.44–1.00)
GFR, Estimated: >60 mL/min (ref >60)
Glucose, Bld: 176 mg/dL — ABNORMAL HIGH (ref 70–99)
Potassium: 4.5 mmol/L (ref 3.5–5.1)
Sodium: 135 mmol/L (ref 135–145)
Total Bilirubin: 0.6 mg/dL (ref 0.3–1.2)
Total Protein: 6.5 g/dL (ref 6.5–8.1)

## 2022-07-20 LAB — RESP PANEL BY RT-PCR (FLU A&B, COVID) ARPGX2
Influenza A by PCR: NEGATIVE
Influenza B by PCR: NEGATIVE
SARS Coronavirus 2 by RT PCR: NEGATIVE

## 2022-07-20 LAB — TROPONIN I (HIGH SENSITIVITY): Troponin I (High Sensitivity): 8 ng/L (ref <18)

## 2022-07-20 NOTE — ED Provider Notes (Signed)
Patient allergic to iodine contrast media, do these orders need to be modified?

## 2022-07-20 NOTE — ED Triage Notes (Signed)
Pt says that she usually has a dry cough, on Saturday she started having productive cough and sore throat, temps around 99. States she has "coughing spasms" that cause her to be SOB. She was started on abx by her PCP and is started prednisone today.

## 2022-07-21 ENCOUNTER — Emergency Department (HOSPITAL_COMMUNITY): Payer: HMO

## 2022-07-21 ENCOUNTER — Encounter: Payer: Self-pay | Admitting: Hematology

## 2022-07-21 LAB — RESP PANEL BY RT-PCR (FLU A&B, COVID) ARPGX2
Influenza A by PCR: NEGATIVE
Influenza B by PCR: NEGATIVE
SARS Coronavirus 2 by RT PCR: NEGATIVE

## 2022-07-21 LAB — TROPONIN I (HIGH SENSITIVITY): Troponin I (High Sensitivity): 8 ng/L (ref <18)

## 2022-07-21 MED ORDER — ALBUTEROL SULFATE HFA 108 (90 BASE) MCG/ACT IN AERS: 1.0000 | INHALATION_SPRAY | Freq: Four times a day (QID) | RESPIRATORY_TRACT | 0 refills | Status: AC | PRN

## 2022-07-21 MED ORDER — DEXAMETHASONE SODIUM PHOSPHATE 10 MG/ML IJ SOLN
10.0000 mg | Freq: Once | INTRAMUSCULAR | Status: AC
  Administered 2022-07-21: 10 mg via INTRAMUSCULAR
  Filled 2022-07-21: qty 1

## 2022-07-21 NOTE — ED Provider Notes (Signed)
[facility] DEPT Provider Note   CSN: [id number] Arrival date & time: 07/20/22  1901     History  Chief Complaint  Patient presents with   Cough    Janice [name] is a 71 y.o. female.  Patient presents to the emergency department for evaluation of cough.  Patient reports that she has had ongoing cough for 4 to 5 days.  Her doctor started her on Zithromax and she had persistent cough which worsened.  She was given a prescription for prednisone today and told to come to the ER if her coughing worsens.  Tonight she has been experiencing spasms of coughing does make her feeling short of breath and she feels like her throat is tight.  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       Home Medications Prior to Admission medications   Medication Sig Start Date End Date Taking? Authorizing Provider  albuterol (PROVENTIL HFA;VENTOLIN HFA) 108 (90 Base) MCG/ACT inhaler Inhale 1-2 puffs into the lungs every 6 (six) hours as needed (Cough).    [provider]  amLODipine (NORVASC) 10 MG tablet Take 10 mg by mouth daily. 02/16/20   [provider]  aspirin EC 81 MG tablet Take 1 tablet (81 mg total) by mouth daily. Swallow whole. 03/09/20   Patwardhan, Reynold Bowen, MD  azelastine (ASTELIN) 0.1 % nasal spray Place 1 spray into both nostrils 2 (two) times daily. Use in each nostril as directed 04/29/21 07/12/22  Tanda Rockers, MD  b complex vitamins capsule Take 1 capsule by mouth daily.    [provider]  Calcium-Magnesium-Zinc (CAL-MAG-ZINC PO) Take 2 tablets by mouth daily.    [provider]  Carboxymethylcellul-Glycerin (REFRESH OPTIVE PF OP) Place 1 drop into both eyes daily as needed (dry eyes). Non-pres    [provider]  Coenzyme Q10 (CO Q-10) 100 MG CAPS Take 100 mg by mouth daily.    [provider]  Continuous Blood Gluc Sensor (FREESTYLE LIBRE 2 SENSOR) MISC Replace every 14 days  and use to check blood sugar continuously topically change every 14 days to monitor blood glucose continuously for 84 days    [provider]  desoximetasone (TOPICORT) 0.25 % cream Apply 1 application topically 4 (four) times daily as needed (rash).    [provider]  diclofenac sodium (VOLTAREN) 1 % GEL Apply 2 g topically 2 (two) times daily as needed (knee pain/ foot /Triger finger). 03/27/18   [provider]  fluticasone (FLONASE) 50 MCG/ACT nasal spray Place 1 spray into both nostrils daily.    [provider]  gabapentin (NEURONTIN) 300 MG capsule 600 mg daily.    [provider]  hydrALAZINE (APRESOLINE) 25 MG tablet Take 1 tablet (25 mg total) by mouth 2 (two) times daily. 05/06/22   Custovic, Collene Mares, DO  insulin aspart (NOVOLOG FLEXPEN) 100 UNIT/ML FlexPen inject 30 units with each meal Subcutaneous three times daily for 30 days 04/07/22   [provider]  insulin degludec (TRESIBA FLEXTOUCH) 200 UNIT/ML FlexTouch Pen inject 80 units Subcutaneous once daily for 30 days 04/07/22   [provider]  insulin NPH Human (HUMULIN N,NOVOLIN N) 100 UNIT/ML injection Inject 30 Units into the skin See admin instructions. Inject 30 units subcutaneously at breakfast, 30 units at lunch & 30 units at supper    [provider]  insulin regular (NOVOLIN R,HUMULIN R) 100 units/mL injection Inject 30 Units into the skin 3 (three)  times daily before meals.    [provider]  irbesartan (AVAPRO) 300 MG tablet Take 300 mg by mouth daily.    [provider]  LORazepam (ATIVAN) 0.5 MG tablet Take 1-2 tablets (0.5-1 mg total) by mouth once as needed for up to 1 dose (prior to MRI or CT scan for claustrophobia). 06/20/22   Brunetta Genera, MD  metFORMIN (GLUCOPHAGE-XR) 500 MG 24 hr tablet Take 1,000 mg by mouth 2 (two) times daily.    [provider]  omeprazole (PRILOSEC) 40 MG capsule Take 40 mg by mouth 2 (two)  times daily. 03/11/17   [provider]  rosuvastatin (CRESTOR) 10 MG tablet Take 1 tablet (10 mg total) by mouth daily. Patient taking differently: Take 10 mg by mouth daily with supper. 03/08/21   Cantwell, Celeste C, PA-C  sertraline (ZOLOFT) 50 MG tablet Take 50 mg by mouth daily. Takes 1/2 tablet daily    [provider]  spironolactone (ALDACTONE) 50 MG tablet Take 1 tablet by mouth once daily 06/27/22   Gatha Mayer, MD  terconazole (TERAZOL 3) 0.8 % vaginal cream Place 1 applicator vaginally as needed (yeast infections).     [provider]  Wheat Dextrin (BENEFIBER PO) Take by mouth. Take 3/4 tsp daily    [provider]      Allergies    Benzonatate, Fluticasone furoate-vilanterol, Iodine, Latex, Simvastatin, Bupropion, Levofloxacin, Penicillins, Povidone-iodine, Amoxicillin, Chlorthalidone, Diflucan [fluconazole], Povidone iodine, Amoxicillin-pot clavulanate, and Doxycycline    Review of Systems   Review of Systems  Physical Exam Updated Vital Signs BP (!) 135/50 (BP Location: Left Arm)   Pulse 75   Temp 98.2 F (36.8 C) (Oral)   Resp 17   SpO2 92%  Physical Exam Vitals and nursing note reviewed.  Constitutional:      General: She is not in acute distress.    Appearance: She is well-developed.  HENT:     Head: Normocephalic and atraumatic.     Mouth/Throat:     Mouth: Mucous membranes are moist.  Eyes:     General: Vision grossly intact. Gaze aligned appropriately.     Extraocular Movements: Extraocular movements intact.     Conjunctiva/sclera: Conjunctivae normal.  Cardiovascular:     Rate and Rhythm: Normal rate and regular rhythm.     Pulses: Normal pulses.     Heart sounds: Normal heart sounds, S1 normal and S2 normal. No murmur heard.    No friction rub. No gallop.  Pulmonary:     Effort: Pulmonary effort is normal. No respiratory distress.     Breath sounds: Normal breath sounds.  Abdominal:     General: Bowel sounds  are normal.     Palpations: Abdomen is soft.     Tenderness: There is no abdominal tenderness. There is no guarding or rebound.     Hernia: No hernia is present.  Musculoskeletal:        General: No swelling.     Cervical back: Full passive range of motion without pain, normal range of motion and neck supple. No spinous process tenderness or muscular tenderness. Normal range of motion.     Right lower leg: No edema.     Left lower leg: No edema.  Skin:    General: Skin is warm and dry.     Capillary Refill: Capillary refill takes less than 2 seconds.     Findings: No ecchymosis, erythema, rash or wound.  Neurological:     General: No focal  deficit present.     Mental Status: She is alert and oriented to person, place, and time.     GCS: GCS eye subscore is 4. GCS verbal subscore is 5. GCS motor subscore is 6.     Cranial Nerves: Cranial nerves 2-12 are intact.     Sensory: Sensation is intact.     Motor: Motor function is intact.     Coordination: Coordination is intact.  Psychiatric:        Attention and Perception: Attention normal.        Mood and Affect: Mood normal.        Speech: Speech normal.        Behavior: Behavior normal.     ED Results / Procedures / Treatments   Labs (all labs ordered are listed, but only abnormal results are displayed) Labs Reviewed  CBC WITH DIFFERENTIAL/PLATELET - Abnormal; Notable for the following components:      Result Value   WBC 31.0 (*)    RBC 3.84 (*)    Hemoglobin 9.7 (*)    HCT 32.5 (*)    MCH 25.3 (*)    MCHC 29.8 (*)    RDW 22.9 (*)    Platelets 97 (*)    Lymphs Abs 22.4 (*)    Abs Immature Granulocytes 0.29 (*)    All other components within normal limits  COMPREHENSIVE METABOLIC PANEL - Abnormal; Notable for the following components:   Glucose, Bld 176 (*)    Calcium 8.6 (*)    All other components within normal limits  RESP PANEL BY RT-PCR (FLU A&B, COVID) ARPGX2  RESP PANEL BY RT-PCR (FLU A&B, COVID) ARPGX2   PATHOLOGIST SMEAR REVIEW  TROPONIN I (HIGH SENSITIVITY)  TROPONIN I (HIGH SENSITIVITY)    EKG None  Radiology CT Soft Tissue Neck Wo Contrast  Result Date: 07/21/2022 CLINICAL DATA:  Cough, sore throat; CLL EXAM: CT NECK WITHOUT CONTRAST TECHNIQUE: Multidetector CT imaging of the neck was performed following the standard protocol without intravenous contrast. RADIATION DOSE REDUCTION: This exam was performed according to the departmental dose-optimization program which includes automated exposure control, adjustment of the mA and/or kV according to patient size and/or use of iterative reconstruction technique. COMPARISON:  No prior CT of the neck, correlation is made with 06/22/2022 FINDINGS: Pharynx and larynx: Mild enlargement of the palatine tonsils, without low-density collection to suggest abscess or phlegmon. Bilateral tonsil stones. The larynx is unremarkable. Salivary glands: No inflammation, mass, or stone. Thyroid: Normal. Lymph nodes: Numerous enlarged cervical, mediastinal, and subpectoral lymph nodes, which appear similar to the 06/22/2022 PET-CT and consistent with the patient's diagnosis of CLL. Vascular: Negative. Limited intracranial: Negative. Visualized orbits: Negative. Mastoids and visualized paranasal sinuses: Clear. Skeleton: No acute osseous abnormality. Upper chest: No focal pulmonary opacity or pleural effusion. Aortic atherosclerosis. Other: None. IMPRESSION: 1. Mild enlargement of the palatine tonsils, as can be seen with tonsillitis, without low-density collection to suggest abscess or phlegmon. 2. Numerous enlarged cervical, mediastinal, and subpectoral lymph nodes, which appear similar to the 06/22/2022 PET-CT and consistent with the patient's diagnosis of CLL. 3. Aortic atherosclerosis. Aortic Atherosclerosis (ICD10-I70.0). Electronically Signed   By: Merilyn Baba M.D.   On: 07/21/2022 03:30   DG Chest 2 View  Result Date: 07/20/2022 CLINICAL DATA:  Cough for 1 week.  Shortness of breath. EXAM: CHEST - 2 VIEW COMPARISON:  Chest radiograph 08/27/2021. PET CT 06/22/2022 FINDINGS: The cardiomediastinal contours are normal. Mild scarring in the left lung. Pulmonary vasculature is normal. No  consolidation, pleural effusion, or pneumothorax. No acute osseous abnormalities are seen. IMPRESSION: No acute abnormality. Mild scarring in the left lung. Electronically Signed   By: Keith Rake M.D.   On: 07/20/2022 20:35    Procedures Procedures    Medications Ordered in ED Medications  dexamethasone (DECADRON) injection 10 mg (10 mg Intramuscular Given 07/21/22 0243)    ED Course/ Medical Decision Making/ A&P                           Medical Decision Making Amount and/or Complexity of Data Reviewed External Data Reviewed: labs, radiology and notes. Labs: ordered. Decision-making details documented in ED Course. Radiology: ordered and independent interpretation performed. Decision-making details documented in ED Course.  Risk Prescription drug management.   Presents to the emergency department with cough.  Patient has had a cough for a number of days, already started on Zithromax by her primary care doctor.  Cough persisted today and her doctor called in prednisone which she has taken 1 dose.  Tonight she felt like her throat was closing.  She has a known history of CLL with cervical lymphadenopathy at baseline.  CT neck was performed and there is no significant change.  No airway edema.  She does not have any stridor.  Chest x-ray without evidence of pneumonia.  She was given Decadron IM to help with her sore throat which is most likely viral in nature.  She is already on Zithromax, no additional antibiotics needed.  She can continue to take the prednisone at home.          Final Clinical Impression(s) / ED Diagnoses Final diagnoses:  Bronchitis    Rx / DC Orders ED Discharge Orders     None         Mykah Shin, Gwenyth Allegra, MD 07/21/22  [phone]

## 2022-07-22 LAB — PATHOLOGIST SMEAR REVIEW

## 2022-07-28 ENCOUNTER — Other Ambulatory Visit: Payer: HMO

## 2022-08-05 ENCOUNTER — Telehealth: Payer: Self-pay | Admitting: Internal Medicine

## 2022-08-05 MED ORDER — AZELASTINE HCL 0.1 % NA SOLN: 1.0000 | Freq: Two times a day (BID) | NASAL | 0 refills | Status: DC

## 2022-08-05 NOTE — Telephone Encounter (Signed)
Spoke with pt and informed her she is overdue for an appt. to renew her medications. Pt verbalized understanding. I also made an appt with Dr Melvyn Novas for her and refilled her Azelastine nasal spray. Nothing further needed at this time.

## 2022-08-11 ENCOUNTER — Other Ambulatory Visit: Payer: HMO

## 2022-08-11 ENCOUNTER — Ambulatory Visit: Payer: HMO | Admitting: Student

## 2022-08-11 ENCOUNTER — Ambulatory Visit: Payer: HMO | Admitting: Internal Medicine

## 2022-08-16 ENCOUNTER — Ambulatory Visit: Payer: HMO | Admitting: Internal Medicine

## 2022-08-19 ENCOUNTER — Other Ambulatory Visit: Payer: Self-pay | Admitting: Obstetrics and Gynecology

## 2022-08-19 DIAGNOSIS — Z1231 Encounter for screening mammogram for malignant neoplasm of breast: Secondary | ICD-10-CM

## 2022-08-23 ENCOUNTER — Encounter: Payer: Self-pay | Admitting: Internal Medicine

## 2022-08-23 ENCOUNTER — Encounter: Payer: Self-pay | Admitting: Hematology

## 2022-08-23 ENCOUNTER — Ambulatory Visit (INDEPENDENT_AMBULATORY_CARE_PROVIDER_SITE_OTHER): Payer: HMO | Admitting: Internal Medicine

## 2022-08-23 VITALS — BP 128/58 | HR 79 | Temp 99.0°F | Ht 64.5 in | Wt 224.0 lb

## 2022-08-23 DIAGNOSIS — J45991 Cough variant asthma: Secondary | ICD-10-CM | POA: Diagnosis not present

## 2022-08-23 DIAGNOSIS — R058 Other specified cough: Secondary | ICD-10-CM | POA: Diagnosis not present

## 2022-08-23 MED ORDER — AZELASTINE HCL 0.1 % NA SOLN: 1.0000 | Freq: Two times a day (BID) | NASAL | 11 refills | Status: DC

## 2022-08-23 MED ORDER — BUDESONIDE-FORMOTEROL FUMARATE 80-4.5 MCG/ACT IN AERO: INHALATION_SPRAY | RESPIRATORY_TRACT | 12 refills | Status: DC

## 2022-08-23 MED ORDER — METHYLPREDNISOLONE ACETATE 80 MG/ML IJ SUSP
120.0000 mg | Freq: Once | INTRAMUSCULAR | Status: AC
  Administered 2022-08-23: 120 mg via INTRAMUSCULAR

## 2022-08-23 NOTE — Patient Instructions (Addendum)
Try generic symbicort 80 (or dulera 100)   Take 2 puffs first thing in am and then another 2 puffs about 12 hours later.   Work on inhaler technique:  relax and gently blow all the way out then take a nice smooth full deep breath back in, triggering the inhaler at same time you start breathing in.  Hold breath in for at least  5 seconds if you can. Blow out symbicort 80  thru nose. Rinse and gargle with water when done.  If mouth or throat bother you at all,  try brushing teeth/gums/tongue with arm and hammer toothpaste/ make a slurry and gargle and spit out.      Only use your albuterol as a rescue medication to be used if you can't catch your breath by resting or doing a relaxed purse lip breathing pattern.  - The less you use it, the better it will work when you need it. - Ok to use up to 2 puffs  every 4 hours if you must but call for immediate appointment if use goes up over your usual need - Don't leave home without it !!  (think of it like the spare tire for your car)    My office will be contacting you by phone for referral to voice center at [facility]   - if you don't hear back from my office within one week please call us back or notify us thru MyChart and we'll address it right away.  Depomedrol 120 mg IM   Please schedule a follow up visit in 3 months but call sooner if needed

## 2022-08-23 NOTE — Assessment & Plan Note (Signed)
?   Onset in pt with h/o sinus problems and freq cough  Dating back at least to 2015 @ initial [facility] eval - Allergy profile 04/29/2021 > Eos 0.1 /  IgE  6 - 08/23/2022  After extensive coaching inhaler device,  effectiveness =    75% so try symb 80 2bid and depomedrol 120 mg IM   I doubt she has asthma but she may have an element of airways inflammation that respond to steroids so rec depomedrol 120 mg IM and symbicort 80 2bid along with rx for UACS (see separate a/p) and f/u here q 3 m, call sooner if needed          Each maintenance medication was reviewed in detail including emphasizing most importantly the difference between maintenance and prns and under what circumstances the prns are to be triggered using an action plan format where appropriate.  Total time for H and P, chart review, counseling, reviewing hfa device(s) and generating customized AVS unique to this office visit / same day charting > 30 min for multiple  refractory respiratory  symptoms of uncertain etiology

## 2022-08-23 NOTE — Progress Notes (Addendum)
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 rec dysmista / zyrtec referred to pulmonary clinic 04/29/2021 by Dr   Janice [name] for cough/wheeze with prn use of albuterol x years but rarely used it until around March 12 2021    History of Present Illness  04/29/2021  Pulmonary/ 1st office eval/Janice [name]  Chief Complaint  Patient presents with   Consult    Cough and wheezing for 3 weeks  Onset July 27th 2022 lots of nasal congestion/ drainage turned slt yellow while maint on flonase and clariton rx zpak > worse with cough/ wheeze rx prednisone > nebulizer  in office helped some > but still cough to point of gagging despite breztri 04/22/21 Dyspnea:  not limiting but not longer working out on ex bike Cough: minimal  Sleep: cpap / at least once a night wakes up cough  SABA use: last used 5 h  prior Finishing omnicef / no longer purulent secretions at this point  Rec Stop breztri Take omeprazole 40 mg Take 30- 60 min before your first and last meals of the day  add gabapentin 300 mg each am until the cough is gone for at least a week  GERD diet reviewed, bed blocks rec  Only use your albuterol as a rescue medication Take delsym two tsp every 12 hours and supplement if needed with  Tylenol #3   up to 1-2 every 4 hours to suppress the urge to cough. Swallowing water and/or using ice chips/non mint and menthol containing candies (such as lifesavers or sugarless jolly ranchers) are also effective.  You should rest your voice and avoid activities that you know make you cough. Once you have eliminated the cough for 3 straight days try reducing the Tylenol #3 first,  then the delsym as tolerated.    Stop clariton  Dymista (takes the place of flonas)  twice daily should eliminate the drippy nose (if too expensive ok  For drainage / throat tickle try take CHLORPHENIRAMINE  4 mg  (Chlortab 110m  at [employer] be easiest to find in the green box)  take one every 4 hours as needed - available over the counter- may cause drowsiness so start with just a dose or two an hour before bedtime and see how you tolerate it before trying in daytime )  Allergy profile 04/29/2021 >  Eos 0.1 /  IgE  6 Depomedrol 120  mg IM today Please schedule a follow up office visit in 4 weeks, sooner if needed    05/27/2021  f/u ov/Janice [name] re: cough x 2015 c/w uacs maint on prilosec 40 mg bid  and gabapentin 300 mg with supper - could not tol 300 mg in am  Dyspnea:  one block takes about 23 min and stops a couple of times due to sob but overall improving with regular walking  Cough: still some throat clearing  Sleeping: cpap/ risers and sleeping fine  SABA use: none  02: none  Covid status:   vax x 3   Rec Flu shot today  No change in medications except add gabapentin 100 mg each am - ok to adjust up to  a maximum of 300 mg three times daily  Please schedule a follow up visit in 3 months but call sooner if needed or let me refer you to DR Janice [name] at [facility] for irritable larynx syndrome> did not seek referral   07/20/22 cough so bad could not catch her breath so went to er with nl exam, nl CT neck  08/23/2022  f/u ov/Janice [name] re: UACS  maint on astelin and flonase and  300 mg x 2 hs  Chief Complaint  Patient presents with   Follow-up    Pt states she has been very anemic since LOV.     Dyspnea:  no longer walking around block Cough: dry hack to point of choking "but it's better than it was"   Sleeping: on risers  SABA use: seems to help though took it w/in 2 h ov ov and still having coughing fits in office 02: none      No obvious day to day or daytime variability or assoc excess/ purulent sputum or  mucus plugs or hemoptysis or cp or chest tightness, subjective wheeze or overt sinus or hb symptoms.    . Also denies any obvious fluctuation of symptoms with weather or environmental changes or other aggravating or alleviating factors except as outlined above   No unusual exposure hx or h/o childhood pna/ asthma or knowledge of premature birth.  Current Allergies, Complete Past Medical History, Past Surgical History, Family History, and Social History were reviewed in [employer] record.  ROS  The following are not active complaints unless bolded Hoarseness, sore throat, dysphagia, dental problems, itching, sneezing,  nasal congestion or discharge of excess mucus or purulent secretions, ear ache,   fever, chills, sweats, unintended wt loss or wt gain, classically pleuritic or exertional cp,  orthopnea pnd or arm/hand swelling  or leg swelling, presyncope, palpitations, abdominal pain, anorexia, nausea, vomiting, diarrhea  or change in bowel habits or change in bladder habits, change in stools or change in urine, dysuria, hematuria,  rash, arthralgias, visual complaints, headache, numbness, weakness or ataxia or problems with walking or coordination,  change in mood or  memory.        Current Meds  Medication Sig   albuterol (VENTOLIN HFA) 108 (90 Base) MCG/ACT inhaler Inhale 1-2 puffs into the lungs every 6 (six) hours as needed (Cough).   amLODipine (NORVASC) 10 MG tablet Take 10 mg by mouth daily.   aspirin EC 81 MG tablet Take 1 tablet (81 mg total) by mouth daily. Swallow whole.   azelastine (ASTELIN) 0.1 % nasal spray Place 1 spray into both nostrils 2 (two) times daily. Use in each nostril as directed   b complex vitamins capsule Take 1 capsule by mouth daily.   Calcium-Magnesium-Zinc (CAL-MAG-ZINC PO) Take 2 tablets by mouth daily.   Carboxymethylcellul-Glycerin (REFRESH OPTIVE PF OP) Place 1 drop into both eyes daily as needed (dry eyes). Non-pres   Coenzyme Q10  (CO Q-10) 100 MG CAPS Take 100 mg by mouth daily.   Continuous Blood Gluc Sensor (FREESTYLE LIBRE 2 SENSOR) MISC Replace every 14 days and use to check blood sugar continuously topically change every 14 days to monitor blood glucose continuously for 84 days   desoximetasone (TOPICORT) 0.25 % cream Apply 1 application topically 4 (four) times daily as needed (rash).   diclofenac sodium (VOLTAREN) 1 % GEL Apply 2 g topically 2 (two) times daily as needed (knee pain/ foot /Triger finger).   fluticasone (FLONASE) 50 MCG/ACT nasal spray Place  1 spray into both nostrils daily.   gabapentin (NEURONTIN) 300 MG capsule 600 mg daily.   HUMALOG KWIKPEN 100 UNIT/ML KwikPen Inject into the skin.   hydrALAZINE (APRESOLINE) 25 MG tablet Take 1 tablet (25 mg total) by mouth 2 (two) times daily.   insulin degludec (TRESIBA FLEXTOUCH) 200 UNIT/ML FlexTouch Pen inject 80 units Subcutaneous once daily for 30 days   irbesartan (AVAPRO) 300 MG tablet Take 300 mg by mouth daily.   LORazepam (ATIVAN) 0.5 MG tablet Take 1-2 tablets (0.5-1 mg total) by mouth once as needed for up to 1 dose (prior to MRI or CT scan for claustrophobia).   metFORMIN (GLUCOPHAGE-XR) 500 MG 24 hr tablet Take 1,000 mg by mouth 2 (two) times daily.   omeprazole (PRILOSEC) 40 MG capsule Take 40 mg by mouth 2 (two) times daily.   rosuvastatin (CRESTOR) 10 MG tablet Take 1 tablet (10 mg total) by mouth daily. (Patient taking differently: Take 10 mg by mouth daily with supper.)   sertraline (ZOLOFT) 50 MG tablet Take 50 mg by mouth daily. Takes 1/2 tablet daily   spironolactone (ALDACTONE) 50 MG tablet Take 1 tablet by mouth once daily   terconazole (TERAZOL 3) 0.8 % vaginal cream Place 1 applicator vaginally as needed (yeast infections).                        Past Medical History:  Diagnosis Date   Allergy    Asthma    seasonal   Cellulitis 2013   Left toe   Cirrhosis ([place])    Common migraine    History of   Complication of  anesthesia    Per pt/had breathing problems with "block" during rotator cuff surgery. 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   DDD (degenerative disc disease)    Depression    denies takes paxil for migraines   Diabetes mellitus    type 2   Diabetic peripheral neuropathy (HCC)    Diverticulitis    Diverticulosis    DJD (degenerative joint disease)    GERD (gastroesophageal reflux disease)    History of colon polyps    hyperplastic   History of gastric polyp    Hyperlipidemia    Hypertension    Iron deficiency anemia    Obesity    OSA on CPAP    cpap   Pneumonia    april 2020  mild   Primary localized osteoarthritis of left knee 03/27/2019   Sensorineural hearing loss        Objective:    wts   08/23/2022     224    05/27/21 221 lb 6.4 oz (100.4 kg)  05/18/21 225 lb (102.1 kg)  05/06/21 222 lb (100.7 kg)    Vital signs reviewed  08/23/2022  - Note at rest 02 sats  97% on RA   General appearance:    mod obese amb wf extremely  harsh barking cough and vigorous throat clearing  w/in 2 h of last sba   HEENT : Oropharynx  pristine     Nasal turbinates mild non-specific edema    NECK :  without  apparent JVD/ palpable Nodes/TM    LUNGS: no acc muscle use,  Nl contour chest which is clear to A and P bilaterally without cough on insp or exp maneuvers   CV:  RRR  no s3 or murmur or increase in P2, and no edema   ABD:  soft and nontender with nl inspiratory excursion  in the supine position. No bruits or organomegaly appreciated   MS:  Nl gait/ ext warm without deformities Or obvious joint restrictions  calf tenderness, cyanosis or clubbing    SKIN: warm and dry without lesions    NEURO:  alert, approp, nl sensorium with  no motor or cerebellar deficits apparent.        I personally reviewed images and agree with radiology impression as follows:  CXR:   pa and lateral 07/20/22 No acute abnormality. Mild scarring in the left lung.    I personally reviewed images  and agree with radiology impression as follows:    CT neck s contrast    07/21/22   1. Mild enlargement of the palatine tonsils, as can be seen with tonsillitis, without low-density collection to suggest abscess or phlegmon. 2. Numerous enlarged cervical, mediastinal, and subpectoral lymph nodes, which appear similar to the 06/22/2022 PET-CT and consistent with the patient's diagnosis of CLL.         Assessment

## 2022-08-23 NOTE — Assessment & Plan Note (Signed)
Onset around 1975 with tendency to "sinus infections"  - 2015 clance eval stop acei and use dysmista  - flared July 2022 neg resp to rx for AB >  04/29/2021 d/c breztri/flonase - cyclical cough rx 04/02/5749 >>>  - Allergy profile 04/29/2021 >  Eos 0.1 /  IgE  6 - 05/27/2021 added am dose of gabapentin 100 with rec to gradually build to max of 300 mg tid if tol or see Dr Joya Gaskins at [facility] > referred 08/23/2022   Although initially reported "cough is better"  this definitely relative to what it was in ER where she had a nl lung exam which is again the case today   Of the three most common causes of  Sub-acute / recurrent or chronic cough, only one (GERD)  can actually contribute to/ trigger  the other two (asthma and post nasal drip syndrome)  and perpetuate the cylce of cough.  While not intuitively obvious, many patients with chronic low grade reflux do not cough until there is a primary insult that disturbs the protective epithelial barrier and exposes sensitive nerve endings.   This is typically viral but can due to PNDS and  either may apply here.   The point is that once this occurs, it is difficult to eliminate the cycle  using anything but a maximally effective acid suppression regimen at least in the short run, accompanied by an appropriate diet to address non acid GERD and control / eliminate the cough itself with the use of hard rockk candy and expedite eval at [facility] voice center which I strongly rec over a year ago.

## 2022-08-24 ENCOUNTER — Telehealth: Payer: Self-pay | Admitting: Internal Medicine

## 2022-08-24 NOTE — Telephone Encounter (Signed)
Patient called [place] and informed us that her Symbicort is too expensive for her. Can we run a ticket to see what is covered under her insurance  Thank you

## 2022-08-24 NOTE — Telephone Encounter (Signed)
Patient called to inform the doctor that the medication, Symbicort is too expensive and she would like an alternate medication that is less expensive.  She would like it sent to [facility] on [address] in [place].  Please call patient to confirm at [phone]

## 2022-08-25 ENCOUNTER — Encounter: Payer: Self-pay | Admitting: Hematology

## 2022-08-25 ENCOUNTER — Other Ambulatory Visit (HOSPITAL_COMMUNITY): Payer: Self-pay

## 2022-08-25 NOTE — Telephone Encounter (Signed)
Called patient and she states the Symbicort was too expensive per pharmacy benefits    Advair Diskus and Adair Patter are also covered.    Please advise sir

## 2022-08-25 NOTE — Telephone Encounter (Signed)
Per benefits investigation Advair Diskus and Adair Patter are also covered.

## 2022-08-25 NOTE — Telephone Encounter (Signed)
Those are not the same device at all and tend to cause a lot of cough, which was her main concern,  so I would just stay on hfa albterol prn for now and regroup if satisfied with this plus all the other meds rec on AVS

## 2022-08-25 NOTE — Telephone Encounter (Signed)
Called patient and went over recommendations from Dr Melvyn Novas. She verbalized understanding. Nothing further needed

## 2022-08-26 ENCOUNTER — Encounter: Payer: Self-pay | Admitting: Hematology

## 2022-08-26 ENCOUNTER — Other Ambulatory Visit (INDEPENDENT_AMBULATORY_CARE_PROVIDER_SITE_OTHER): Payer: PPO

## 2022-08-26 ENCOUNTER — Ambulatory Visit: Payer: PPO | Admitting: Internal Medicine

## 2022-08-26 ENCOUNTER — Encounter: Payer: Self-pay | Admitting: Internal Medicine

## 2022-08-26 VITALS — BP 132/64 | HR 80 | Ht 64.5 in | Wt 222.0 lb

## 2022-08-26 DIAGNOSIS — K7581 Nonalcoholic steatohepatitis (NASH): Secondary | ICD-10-CM | POA: Diagnosis not present

## 2022-08-26 DIAGNOSIS — K7460 Unspecified cirrhosis of liver: Secondary | ICD-10-CM

## 2022-08-26 DIAGNOSIS — K429 Umbilical hernia without obstruction or gangrene: Secondary | ICD-10-CM | POA: Diagnosis not present

## 2022-08-26 DIAGNOSIS — R161 Splenomegaly, not elsewhere classified: Secondary | ICD-10-CM

## 2022-08-26 LAB — PROTIME-INR
INR: 1.1 ratio — ABNORMAL HIGH (ref 0.8–1.0)
Prothrombin Time: 12.3 s (ref 9.6–13.1)

## 2022-08-26 NOTE — Patient Instructions (Signed)
Your provider has requested that you go to the basement level for lab work before leaving today. Press "B" on the elevator. The lab is located at the first door on the left as you exit the elevator.  Due to recent changes in healthcare laws, you may see the results of your imaging and laboratory studies on MyChart before your provider has had a chance to review them.  We understand that in some cases there may be results that are confusing or concerning to you. Not all laboratory results come back in the same time frame and the provider may be waiting for multiple results in order to interpret others.  Please give [place] 48 hours in order for your provider to thoroughly review all the results before contacting the office for clarification of your results.   I appreciate the opportunity to care for you. Silvano Rusk, MD

## 2022-08-26 NOTE — Progress Notes (Unsigned)
Janice [name] 71 y.o. [birth year] [id number]  Assessment & Plan:   Encounter Diagnoses  Name Primary?   Liver cirrhosis secondary to NASH (nonalcoholic steatohepatitis) ([place]) Yes   Umbilical hernia without obstruction and without gangrene    Splenomegaly    I think her liver disease is stable.  I need an INR and I will calculate child's class and make a recommendation about risk-benefit ratio and risks of umbilical hernia surgery.  It is appropriate to be cautious here but this may be the best time to perform surgery as she may need immunotherapy for her lymphoma and liver disease could deteriorate.  We will clarify immunization status for AMB hepatitis.  We had given her a printed prescription but I do not know if she ever had that done  Lab Results  Component Value Date   INR 1.1 (H) 08/26/2022   INR 1.1 (H) 05/06/2021   INR 1.1 (H) 07/01/2020    Child-Pugh score is 6 points Grade A and carries a 10% peri-operative mortality for abdominal surgery.  So it is a reasonable time to pursue the hernia repair, if she accepts the risks.   AY:TKZS, Ravisankar, MD Gurney Maxin, MD      Subjective:   Gastroenterology summary:  NASH  cirrhosis diagnosed 2021 trace ascites at the time no encephalopathy no varices last EGD 2022-hyperplastic polypoid changes in the antrum.  On low-dose spironolactone.  She is not immune to hepatitis A or B and has not been vaccinated  Husband died from liver failure due to NASH  History of adenomatous colonic polyps 2012-2 cm pedunculated tubulovillous adenoma removed, complicated by post polypectomy bleed clipped 01/29/2021 08/01/2016 3 adenomas max 6 mm 06/07/2018 no polyps (iron deficiency anemia evaluation). Does have diverticulosis  History of ulcerated hyperplastic gastric polyp snared 2019 Iron deficiency anemia related to this polyp presumably  Umbilical hernia-considering surgical repair Dr. Kieth Brightly  Chief Complaint:  Follow-up of cirrhosis this, questions about possible hernia surgery and implications of therapy for lymphoma  HPI 71 year old white woman with Janice [name] cirrhosis, small cell lymphoma, diabetes, obesity/metabolic syndrome, colon polyps, gastric polyps, iron deficiency anemia who presents for follow-up. She has had symptoms from an umbilical hernia and has seen Dr. Kieth Brightly and she has enlarging lymphadenopathy in the setting of small lymphocytic lymphoma and may start treatment next year and has questions about impact of that on her liver.  She reports Dr. Dell Ponto is concerned about recurrent respiratory infections and thinks she probably needs to start therapy for her lymphoma.  She saw Dr. Irene Limbo on October 31 and the plan was for closer follow-up.  She has started having some night sweats she reports.  She was last seen here March 30, 2022 and things were stable.  She is maintained on low-dose spironolactone she had trace ascites and initial imaging detecting cirrhosis.  I have reviewed her office visit with Dr. From 06/10/2022 as well as October 31 visit with Dr. Irene Limbo.  Imaging and other studies reviewed and reflected below.  I have also reviewed surgical consultation with Dr. Kieth Brightly 08/15/2022.  She has also been in the ER and then back to primary care with respiratory infections and cough.  Lab Results  Component Value Date   WBC 31.0 (H) 07/20/2022   HGB 9.7 (L) 07/20/2022   HCT 32.5 (L) 07/20/2022   MCV 84.6 07/20/2022   PLT 97 (L) 07/20/2022     Chemistry      Component Value Date/Time   NA 135 07/20/2022 1933  NA 137 10/18/2021 1222   K 4.5 07/20/2022 1933   CL 101 07/20/2022 1933   CO2 24 07/20/2022 1933   BUN 13 07/20/2022 1933   BUN 11 10/18/2021 1222   CREATININE 0.69 07/20/2022 1933   CREATININE 0.82 07/12/2022 1052      Component Value Date/Time   CALCIUM 8.6 (L) 07/20/2022 1933   ALKPHOS 80 07/20/2022 1933   AST 21 07/20/2022 1933   AST 17 07/12/2022 1052   ALT 23  07/20/2022 1933   ALT 20 07/12/2022 1052   BILITOT 0.6 07/20/2022 1933   BILITOT 0.5 07/12/2022 1052       Wt Readings from Last 3 Encounters:  08/26/22 222 lb (100.7 kg)  08/23/22 224 lb (101.6 kg)  07/12/22 230 lb 4.8 oz (104.5 kg)     CT PET skull base to thigh 06/23/2022 IMPRESSION: 1. Compared with the previous PET-CT of 05/01/2020, there is progressive enlargement of multiple lymph nodes in the neck, chest, abdomen and pelvis, demonstrating only low level hypermetabolic activity (predominantly [place] 3). 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 2. Stable splenomegaly without significant hypermetabolic activity, nonspecific in light of the patient's cirrhosis and portal hypertension. 3. No evidence of osseous metastatic disease. 4. Stable incidental findings including distal colonic diverticulosis, cirrhosis and a small amount of free pelvic fluid. 5.  Aortic Atherosclerosis (ICD10-I70.0).   Allergies  Allergen Reactions   Benzonatate Anaphylaxis and Hives   Fluticasone Furoate-Vilanterol Anaphylaxis and Hives    Other reaction(s): hives  breo ellipta   Iodine Hives, Swelling and Other (See Comments)    And Betadine/caused hives and swelling   Latex Hives, Itching, Rash and Other (See Comments)    Burning   Simvastatin Other (See Comments)    Caused neuropathy in arms Neuropathy in arms   Bupropion Other (See Comments)    Negative thoughts. Crazy thoughts   Levofloxacin Other (See Comments)    Joint aches//leg, shoulder pain Joint pain   Penicillins Hives   Povidone-Iodine Hives   Amoxicillin Hives    Blisters Did it involve swelling of the face/tongue/throat, SOB, or low BP? Yes Did it involve sudden or severe rash/hives, skin peeling, or any reaction on the inside of your mouth or nose? Yes Did you need to seek medical attention at a hospital or doctor's office?Unknown When did it last happen? 2019      If all above answers are "NO", may proceed  with cephalosporin use.    Chlorthalidone Other (See Comments)    Ears ringing    Diflucan [Fluconazole] Hives and Other (See Comments)    blister   Povidone Iodine    Amoxicillin-Pot Clavulanate Rash   Doxycycline Itching and Diarrhea   Current Meds  Medication Sig   albuterol (VENTOLIN HFA) 108 (90 Base) MCG/ACT inhaler Inhale 1-2 puffs into the lungs every 6 (six) hours as needed (Cough).   amLODipine (NORVASC) 10 MG tablet Take 10 mg by mouth daily.   aspirin EC 81 MG tablet Take 1 tablet (81 mg total) by mouth daily. Swallow whole.   azelastine (ASTELIN) 0.1 % nasal spray Place 1 spray into both nostrils 2 (two) times daily. Use in each nostril as directed   b complex vitamins capsule Take 1 capsule by mouth daily.   budesonide-formoterol (SYMBICORT) 80-4.5 MCG/ACT inhaler Take 2 puffs first thing in am and then another 2 puffs about 12 hours later.   Calcium-Magnesium-Zinc (CAL-MAG-ZINC PO) Take 2 tablets by mouth daily.   Carboxymethylcellul-Glycerin (REFRESH OPTIVE PF OP)  Place 1 drop into both eyes daily as needed (dry eyes). Non-pres   Coenzyme Q10 (CO Q-10) 100 MG CAPS Take 100 mg by mouth daily.   Continuous Blood Gluc Sensor (FREESTYLE LIBRE 2 SENSOR) MISC Replace every 14 days and use to check blood sugar continuously topically change every 14 days to monitor blood glucose continuously for 84 days   desoximetasone (TOPICORT) 0.25 % cream Apply 1 application topically 4 (four) times daily as needed (rash).   diclofenac sodium (VOLTAREN) 1 % GEL Apply 2 g topically 2 (two) times daily as needed (knee pain/ foot /Triger finger).   fluticasone (FLONASE) 50 MCG/ACT nasal spray Place 1 spray into both nostrils daily.   gabapentin (NEURONTIN) 300 MG capsule 600 mg daily.   HUMALOG KWIKPEN 100 UNIT/ML KwikPen Inject into the skin.   hydrALAZINE (APRESOLINE) 25 MG tablet Take 1 tablet (25 mg total) by mouth 2 (two) times daily.   insulin degludec (TRESIBA FLEXTOUCH) 200 UNIT/ML  FlexTouch Pen inject 80 units Subcutaneous once daily for 30 days   irbesartan (AVAPRO) 300 MG tablet Take 300 mg by mouth daily.   LORazepam (ATIVAN) 0.5 MG tablet Take 1-2 tablets (0.5-1 mg total) by mouth once as needed for up to 1 dose (prior to MRI or CT scan for claustrophobia).   metFORMIN (GLUCOPHAGE-XR) 500 MG 24 hr tablet Take 1,000 mg by mouth 2 (two) times daily.   omeprazole (PRILOSEC) 40 MG capsule Take 40 mg by mouth 2 (two) times daily.   rosuvastatin (CRESTOR) 10 MG tablet Take 1 tablet (10 mg total) by mouth daily. (Patient taking differently: Take 10 mg by mouth daily with supper.)   sertraline (ZOLOFT) 50 MG tablet Take 50 mg by mouth daily. Takes 1/2 tablet daily   spironolactone (ALDACTONE) 50 MG tablet Take 1 tablet by mouth once daily   terconazole (TERAZOL 3) 0.8 % vaginal cream Place 1 applicator vaginally as needed (yeast infections).    Past Medical History:  Diagnosis Date   Allergy    Asthma    seasonal   Cellulitis 2013   Left toe   Cirrhosis ([place])    Common migraine    History of   Complication of anesthesia    Per pt/had breathing problems with "block" during rotator cuff surgery. 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   DDD (degenerative disc disease)    Depression    denies takes paxil for migraines   Diabetes mellitus    type 2   Diabetic peripheral neuropathy ([facility])    Diverticulitis    Diverticulosis    DJD (degenerative joint disease)    Esophageal varices ([facility])    GERD (gastroesophageal reflux disease)    History of colon polyps    hyperplastic   History of gastric polyp    Hyperlipidemia    Hypertension    Iron deficiency anemia    Obesity    OSA on CPAP    cpap   Peripheral neuropathy    Pneumonia    april 2020  mild   Primary localized osteoarthritis of left knee 03/27/2019   Sensorineural hearing loss    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[place])    Past Surgical History:  Procedure Laterality Date   ANKLE SURGERY     Left     COLONOSCOPY  05/2018   LEEP N/A 09/14/2018   Procedure: LOOP ELECTROSURGICAL EXCISION PROCEDURE (LEEP);  Surgeon: Arvella Nigh, MD;  Location[facility];  Service: Gynecology;  Laterality: N/A;   MASS EXCISION Right  06/24/2021   Procedure: EXCISION RIGHT POSTERIOR NECK MASS;  Surgeon: Kinsinger, [name], MD;  Location: [facility];  Service: General;  Laterality: Right;   ROTATOR CUFF REPAIR Bilateral 2012, 2015   TONSILLECTOMY     TOTAL KNEE ARTHROPLASTY Left 04/08/2019   Procedure: TOTAL KNEE ARTHROPLASTY;  Surgeon: Elsie Saas, MD;  Location: [facility];  Service: Orthopedics;  Laterality: Left;   UPPER GI ENDOSCOPY  05/2018   Social History   Social History Narrative   1 caffeine drink daily    Right-handed   widow (husband died from [place] cirrhosis)   family history includes Bone cancer in her mother; Breast cancer in her mother; Diabetes Mellitus II in her brother, father, and sister; Heart failure in her father; Hypertension in her brother and father; Obesity in her sister; Other in her sister.   Review of Systems See HPI  Objective:   Physical Exam @BP  132/64   Pulse 80   Ht 5' 4.5" (1.638 m)   Wt 222 lb (100.7 kg)   BMI 37.52 kg/m @  General:  Well-developed, well-nourished and in no acute distress Eyes:  anicteric. ENT:   Mouth and posterior pharynx free of lesions.  Neck:   supple w/o thyromegaly or mass.  Lungs: Clear to auscultation bilaterally. Heart:  S1S2, no rubs, murmurs, gallops. Abdomen:  Obese soft, non-tender, no hepatosplenomegaly palpated,  or mass and BS+.  Small soft umbilical hernia w/ some violaceous discoloration Extremities:   no edema, cyanosis or clubbing Skin   no rash.no spider angiomata Neuro:  A&O x 3.  Psych:  appropriate mood and  Affect.   Data Reviewed: See HPI for description

## 2022-08-29 ENCOUNTER — Encounter: Payer: Self-pay | Admitting: Internal Medicine

## 2022-08-29 DIAGNOSIS — K7460 Unspecified cirrhosis of liver: Secondary | ICD-10-CM

## 2022-08-30 ENCOUNTER — Telehealth: Payer: Self-pay | Admitting: Internal Medicine

## 2022-08-30 NOTE — Telephone Encounter (Signed)
1.  Current child classification is A - 6 points-10% perioperative mortality risk  Reviewed with patient regarding possible hernia repair per Dr. Kieth Brightly.  2.  She asked if a keto diet was acceptable in her state.  I think probably so.  She is not sure she could do it we had conversations about lower carbohydrate eating (had mentioned in the past as well).  I think low-carb as opposed to extreme low-carb (keto) is worth trying.  Not sure if diverse liver fibrosis but certainly would help with metabolic health.  3.  She has not been vaccinated for hepatitis A or B yet we recommended that in the past and she said she was afraid of the potential side effects of the vaccines.  She has upcoming follow-up in hematology because of CLL issues.  I do not think this is a critical need at this time right now and she is not inclined to pursue.  I do think that it would make sense to have these prior to any type of immunotherapy to generate the best immune response if she decides to have these.

## 2022-09-15 DIAGNOSIS — Z0110 Encounter for examination of ears and hearing without abnormal findings: Secondary | ICD-10-CM | POA: Diagnosis not present

## 2022-09-15 DIAGNOSIS — H93233 Hyperacusis, bilateral: Secondary | ICD-10-CM | POA: Diagnosis not present

## 2022-09-19 ENCOUNTER — Other Ambulatory Visit: Payer: Self-pay

## 2022-09-19 DIAGNOSIS — C9110 Chronic lymphocytic leukemia of B-cell type not having achieved remission: Secondary | ICD-10-CM

## 2022-09-20 ENCOUNTER — Other Ambulatory Visit: Payer: Self-pay | Admitting: *Deleted

## 2022-09-20 ENCOUNTER — Other Ambulatory Visit: Payer: Self-pay

## 2022-09-20 ENCOUNTER — Inpatient Hospital Stay (HOSPITAL_BASED_OUTPATIENT_CLINIC_OR_DEPARTMENT_OTHER): Payer: PPO | Admitting: Hematology

## 2022-09-20 ENCOUNTER — Inpatient Hospital Stay: Payer: PPO | Attending: Hematology

## 2022-09-20 VITALS — BP 142/53 | HR 88 | Temp 97.7°F | Resp 18 | Wt 220.1 lb

## 2022-09-20 DIAGNOSIS — C9110 Chronic lymphocytic leukemia of B-cell type not having achieved remission: Secondary | ICD-10-CM

## 2022-09-20 DIAGNOSIS — K7581 Nonalcoholic steatohepatitis (NASH): Secondary | ICD-10-CM | POA: Diagnosis not present

## 2022-09-20 DIAGNOSIS — R162 Hepatomegaly with splenomegaly, not elsewhere classified: Secondary | ICD-10-CM | POA: Insufficient documentation

## 2022-09-20 DIAGNOSIS — K7460 Unspecified cirrhosis of liver: Secondary | ICD-10-CM | POA: Insufficient documentation

## 2022-09-20 DIAGNOSIS — D509 Iron deficiency anemia, unspecified: Secondary | ICD-10-CM | POA: Insufficient documentation

## 2022-09-20 DIAGNOSIS — R61 Generalized hyperhidrosis: Secondary | ICD-10-CM | POA: Insufficient documentation

## 2022-09-20 DIAGNOSIS — D696 Thrombocytopenia, unspecified: Secondary | ICD-10-CM | POA: Insufficient documentation

## 2022-09-20 LAB — CMP (CANCER CENTER ONLY)
ALT: 27 U/L (ref 0–44)
AST: 19 U/L (ref 15–41)
Albumin: 4.7 g/dL (ref 3.5–5.0)
Alkaline Phosphatase: 71 U/L (ref 38–126)
Anion gap: 6 (ref 5–15)
BUN: 23 mg/dL (ref 8–23)
CO2: 25 mmol/L (ref 22–32)
Calcium: 9.4 mg/dL (ref 8.9–10.3)
Chloride: 100 mmol/L (ref 98–111)
Creatinine: 0.82 mg/dL (ref 0.44–1.00)
GFR, Estimated: >60 mL/min (ref >60)
Glucose, Bld: 97 mg/dL (ref 70–99)
Potassium: 5.2 mmol/L — ABNORMAL HIGH (ref 3.5–5.1)
Sodium: 131 mmol/L — ABNORMAL LOW (ref 135–145)
Total Bilirubin: 0.5 mg/dL (ref 0.3–1.2)
Total Protein: 6.7 g/dL (ref 6.5–8.1)

## 2022-09-20 LAB — CBC WITH DIFFERENTIAL (CANCER CENTER ONLY)
Abs Immature Granulocytes: 0.21 10*3/uL — ABNORMAL HIGH (ref 0.00–0.07)
Basophils Absolute: 0.2 10*3/uL — ABNORMAL HIGH (ref 0.0–0.1)
Basophils Relative: 1 %
Eosinophils Absolute: 0.2 10*3/uL (ref 0.0–0.5)
Eosinophils Relative: 1 %
HCT: 34.5 % — ABNORMAL LOW (ref 36.0–46.0)
Hemoglobin: 11.1 g/dL — ABNORMAL LOW (ref 12.0–15.0)
Immature Granulocytes: 1 %
Lymphocytes Relative: 75 %
Lymphs Abs: 30.6 10*3/uL — ABNORMAL HIGH (ref 0.7–4.0)
MCH: 27.8 pg (ref 26.0–34.0)
MCHC: 32.2 g/dL (ref 30.0–36.0)
MCV: 86.5 fL (ref 80.0–100.0)
Monocytes Absolute: 1.4 10*3/uL — ABNORMAL HIGH (ref 0.1–1.0)
Monocytes Relative: 4 %
Neutro Abs: 7.3 10*3/uL (ref 1.7–7.7)
Neutrophils Relative %: 18 %
Platelet Count: 101 10*3/uL — ABNORMAL LOW (ref 150–400)
RBC: 3.99 MIL/uL (ref 3.87–5.11)
RDW: 18.2 % — ABNORMAL HIGH (ref 11.5–15.5)
Smear Review: NORMAL
WBC Count: 39.9 10*3/uL — ABNORMAL HIGH (ref 4.0–10.5)
nRBC: 0 % (ref 0.0–0.2)

## 2022-09-20 LAB — LACTATE DEHYDROGENASE: LDH: 187 U/L (ref 98–192)

## 2022-09-20 LAB — FERRITIN: Ferritin: 22 ng/mL (ref 11–307)

## 2022-09-20 LAB — SAMPLE TO BLOOD BANK

## 2022-09-20 NOTE — Progress Notes (Signed)
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  INTERVAL HISTORY: Janice [name] is a 72 y.o. 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   Patient was last seen by me on 07/12/2022 and she complained of fatigue, burning pain sensation in left flank, and slightly enlarged nodules in the cervical region.   Patient is accompanied by her friend during this visit. She reports she has been well overall since the last month. She has been feeling less fatigued and has been walking more often.   Patient denies fever, chills, unexpected weight loss, new lumps/bumps, new infection issues, chest pain, abdominal pain, back pain, or leg swelling. She complains of occasional swallowing problem when drinking any liquid.   Patient complains of occasional sharp pain near her right head. She reports of getting frequent respiratory illnesses since our last visit. The last time she had respiratory illness was in November of 2023.   She complains of occasional night sweats in the past month. She notes that her night sweats are not drenching.   She reports that she gets full quickly which causes her to stop eating.   Patient notes she is planning on getting umbilical hernia surgery in the upcoming month.  She has received the influenza vaccine, COVID-19 Booster, and RSV vaccine.   MEDICAL HISTORY:  Past Medical History:  Diagnosis Date   Allergy    Asthma    seasonal   Cellulitis 2013   Left toe   Cirrhosis ([place])    Common migraine    History of   Complication of anesthesia    Per pt/had breathing problems with "block" during rotator cuff surgery. 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    DDD (degenerative disc disease)    Depression    denies takes paxil for migraines   Diabetes mellitus    type 2   Diabetic peripheral neuropathy (HCC)    Diverticulitis    Diverticulosis    DJD (degenerative joint disease)    Esophageal varices (HCC)    GERD (gastroesophageal reflux disease)    History of colon polyps    hyperplastic   History of gastric polyp    Hyperlipidemia    Hypertension    Iron deficiency anemia    Obesity    OSA on CPAP    cpap   Peripheral neuropathy    Pneumonia    april 2020  mild   Primary localized osteoarthritis of left knee 03/27/2019   Sensorineural hearing loss    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[place])     SURGICAL HISTORY: Past Surgical History:  Procedure Laterality Date   ANKLE SURGERY     Left    COLONOSCOPY  05/2018   LEEP N/A 09/14/2018   Procedure: LOOP ELECTROSURGICAL EXCISION PROCEDURE (LEEP);  Surgeon: Arvella Nigh, MD;  Location: [facility];  Service: Gynecology;  Laterality: N/A;   MASS EXCISION Right 06/24/2021   Procedure: EXCISION RIGHT POSTERIOR NECK MASS;  Surgeon: Kinsinger, [name], MD;  Location: [facility];  Service: General;  Laterality: Right;   ROTATOR CUFF REPAIR Bilateral 2012, 2015   TONSILLECTOMY     TOTAL KNEE ARTHROPLASTY Left 04/08/2019   Procedure: TOTAL KNEE ARTHROPLASTY;  Surgeon: Elsie Saas, MD;  Location: [facility];  Service: Orthopedics;  Laterality: Left;   UPPER GI ENDOSCOPY  05/2018    SOCIAL HISTORY: Social History   Socioeconomic History   Marital status: Widowed    Spouse name: Not on file   Number of children: 0   Years of education: 3 years college   Highest education level: Not on file  Occupational History   Occupation: Unemployed  Tobacco Use   Smoking status: Never   Smokeless tobacco: Never  Vaping Use   Vaping Use: Never used  Substance and Sexual Activity   Alcohol use: Not Currently    Comment: very rare   Drug use: No   Sexual activity: Not Currently     Birth control/protection: None, Post-menopausal  Other Topics Concern   Not on file  Social History Narrative   1 caffeine drink daily    Right-handed   widow (husband died from [place] cirrhosis)   Social Determinants of Health   Financial Resource Strain: Medium Risk (08/20/2020)   Overall Financial Resource Strain (CARDIA)    Difficulty of Paying Living Expenses: Somewhat hard  Food Insecurity: No Food Insecurity (08/20/2020)   Hunger Vital Sign    Worried About Running Out of Food in the Last Year: Never true    [place] in the Last Year: Never true  Transportation Needs: No Transportation Needs (08/20/2020)   PRAPARE - Hydrologist (Medical): No    Lack of Transportation (Non-Medical): No  Physical Activity: Not on file  Stress: Not on file  Social Connections: Socially Integrated (08/20/2020)   Social Connection and Isolation Panel [NHANES]    Frequency of Communication with Friends and Family: More than three times a week    Frequency of Social Gatherings with Friends and Family: More than three times a week    Attends Religious Services: More than 4 times per year    Active Member of [employer] or Organizations: Yes    Attends Music therapist: More than 4 times per year    Marital Status: Married  Human resources officer Violence: Not on file    FAMILY HISTORY: Family History  Problem Relation Age of Onset   Breast cancer Mother    Bone cancer Mother    Heart failure Father    Diabetes Mellitus II Father    Hypertension Father    Diabetes Mellitus II Sister    Obesity Sister    Other Sister        retina problem   Hypertension Brother    Diabetes Mellitus II Brother    Colon cancer Neg Hx    Rectal cancer Neg Hx    Stomach cancer Neg Hx    Esophageal cancer Neg Hx    Pancreatic cancer Neg Hx    Liver disease Neg Hx     ALLERGIES:  is allergic to benzonatate, fluticasone furoate-vilanterol, iodine, latex, simvastatin,  bupropion, levofloxacin, penicillins, povidone-iodine, amoxicillin, chlorthalidone, diflucan [fluconazole], povidone iodine, amoxicillin-pot clavulanate, and doxycycline.  MEDICATIONS:  Current Outpatient Medications  Medication Sig Dispense Refill   albuterol (VENTOLIN HFA) 108 (90 Base) MCG/ACT inhaler Inhale 1-2 puffs into the lungs every 6 (six) hours as needed (Cough). 18 g 0   amLODipine (NORVASC) 10 MG tablet Take 10 mg by mouth daily.     aspirin EC 81 MG tablet Take 1 tablet (81 mg total) by mouth daily. Swallow whole. 90 tablet 3   azelastine (ASTELIN) 0.1 % nasal spray Place 1 spray into both nostrils  2 (two) times daily. Use in each nostril as directed 9 mL 11   b complex vitamins capsule Take 1 capsule by mouth daily.     budesonide-formoterol (SYMBICORT) 80-4.5 MCG/ACT inhaler Take 2 puffs first thing in am and then another 2 puffs about 12 hours later. 1 each 12   Calcium-Magnesium-Zinc (CAL-MAG-ZINC PO) Take 2 tablets by mouth daily.     Carboxymethylcellul-Glycerin (REFRESH OPTIVE PF OP) Place 1 drop into both eyes daily as needed (dry eyes). Non-pres     Coenzyme Q10 (CO Q-10) 100 MG CAPS Take 100 mg by mouth daily.     Continuous Blood Gluc Sensor (FREESTYLE LIBRE 2 SENSOR) MISC Replace every 14 days and use to check blood sugar continuously topically change every 14 days to monitor blood glucose continuously for 84 days     desoximetasone (TOPICORT) 0.25 % cream Apply 1 application topically 4 (four) times daily as needed (rash).     diclofenac sodium (VOLTAREN) 1 % GEL Apply 2 g topically 2 (two) times daily as needed (knee pain/ foot /Triger finger).  2   fluticasone (FLONASE) 50 MCG/ACT nasal spray Place 1 spray into both nostrils daily.     gabapentin (NEURONTIN) 300 MG capsule 600 mg daily.     HUMALOG KWIKPEN 100 UNIT/ML KwikPen Inject into the skin.     hydrALAZINE (APRESOLINE) 25 MG tablet Take 1 tablet (25 mg total) by mouth 2 (two) times daily. 180 tablet 2    insulin degludec (TRESIBA FLEXTOUCH) 200 UNIT/ML FlexTouch Pen inject 80 units Subcutaneous once daily for 30 days     irbesartan (AVAPRO) 300 MG tablet Take 300 mg by mouth daily.     LORazepam (ATIVAN) 0.5 MG tablet Take 1-2 tablets (0.5-1 mg total) by mouth once as needed for up to 1 dose (prior to MRI or CT scan for claustrophobia). 4 tablet 0   metFORMIN (GLUCOPHAGE-XR) 500 MG 24 hr tablet Take 1,000 mg by mouth 2 (two) times daily.     omeprazole (PRILOSEC) 40 MG capsule Take 40 mg by mouth 2 (two) times daily.     rosuvastatin (CRESTOR) 10 MG tablet Take 1 tablet (10 mg total) by mouth daily. (Patient taking differently: Take 10 mg by mouth daily with supper.) 90 tablet 3   sertraline (ZOLOFT) 50 MG tablet Take 50 mg by mouth daily. Takes 1/2 tablet daily     spironolactone (ALDACTONE) 50 MG tablet Take 1 tablet by mouth once daily 90 tablet 0   terconazole (TERAZOL 3) 0.8 % vaginal cream Place 1 applicator vaginally as needed (yeast infections).      No current facility-administered medications for this visit.    REVIEW OF SYSTEMS:   .10 Point review of Systems was done is negative except as noted above.  PHYSICAL EXAMINATION: ECOG PERFORMANCE STATUS: 1 - Symptomatic but completely ambulatory  There were no vitals filed for this visit.  NAD GENERAL:alert, in no acute distress and comfortable SKIN: no acute rashes, no significant lesions EYES: conjunctiva are pink and non-injected, sclera anicteric NECK: supple, no JVD LYMPH:  small palpable LNadenopathy b/l neck and axillary regions. LUNGS: clear to auscultation b/l with normal respiratory effort HEART: regular rate & rhythm ABDOMEN:  normoactive bowel sounds , non tender, not distended. Extremity: no pedal edema PSYCH: alert & oriented x 3 with fluent speech NEURO: no focal motor/sensory deficits  Exam performed in chair.  LABORATORY DATA:  I have reviewed the data as listed  .    Latest Ref Rng &  Units 09/20/2022    12:10 PM 07/20/2022    7:33 PM 07/12/2022   10:52 AM  CBC  WBC 4.0 - 10.5 K/uL 39.9  31.0  33.6   Hemoglobin 12.0 - 15.0 g/dL 11.1  9.7  10.0   Hematocrit 36.0 - 46.0 % 34.5  32.5  33.8   Platelets 150 - 400 K/uL 101  97  97    . CBC    Component Value Date/Time   WBC 39.9 (H) 09/20/2022 1210   WBC 31.0 (H) 07/20/2022 1933   RBC 3.99 09/20/2022 1210   HGB 11.1 (L) 09/20/2022 1210   HGB 10.3 (L) 10/18/2021 1222   HCT 34.5 (L) 09/20/2022 1210   HCT 33.0 (L) 10/18/2021 1222   PLT 101 (L) 09/20/2022 1210   PLT 100 (LL) 10/18/2021 1222   MCV 86.5 09/20/2022 1210   MCV 84 10/18/2021 1222   MCH 27.8 09/20/2022 1210   MCHC 32.2 09/20/2022 1210   RDW 18.2 (H) 09/20/2022 1210   RDW 15.1 10/18/2021 1222   LYMPHSABS 30.6 (H) 09/20/2022 1210   MONOABS 1.4 (H) 09/20/2022 1210   EOSABS 0.2 09/20/2022 1210   BASOSABS 0.2 (H) 09/20/2022 1210     .    Latest Ref Rng & Units 09/20/2022   12:10 PM 07/20/2022    7:33 PM 07/12/2022   10:52 AM  CMP  Glucose 70 - 99 mg/dL 97  176  172   BUN 8 - 23 mg/dL '23  13  15   '$ Creatinine 0.44 - 1.00 mg/dL 0.82  0.69  0.82   Sodium 135 - 145 mmol/L 131  135  135   Potassium 3.5 - 5.1 mmol/L 5.2  4.5  5.1   Chloride 98 - 111 mmol/L 100  101  102   CO2 22 - 32 mmol/L '25  24  28   '$ Calcium 8.9 - 10.3 mg/dL 9.4  8.6  9.1   Total Protein 6.5 - 8.1 g/dL 6.7  6.5  6.4   Total Bilirubin 0.3 - 1.2 mg/dL 0.5  0.6  0.5   Alkaline Phos 38 - 126 U/L 71  80  71   AST 15 - 41 U/L '19  21  17   '$ ALT 0 - 44 U/L '27  23  20    '$ . Lab Results  Component Value Date   LDH 187 09/20/2022    04/08/2020 FISH/CLL Panel:   03/30/2020 Left Axilla Flow Pathology Report [id number]):   03/30/2020 Left axilla lymph node Bx ([id number]):   RADIOGRAPHIC STUDIES: I have personally reviewed the radiological images as listed and agreed with the findings in the report. No results found.  MRI abdomen with and without contrast 07/08/2021 showed IMPRESSION: 1. Moderate  motion and patient body habitus degraded exam. 2. Extensive abdominal adenopathy, similar to slightly increased compared to 05/01/2020 PET. 3. Cirrhosis and hepatosplenomegaly with suspicion of periesophageal varices. 4. Nonspecific hypoenhancing or nonenhancing splenic lesions. These could simply represent Crissie Figures bodies in the setting of cirrhosis. Lymphomatous involvement cannot be excluded. 5. Heterogeneous enhancement throughout the liver, without well-defined suspicious mass. Possibly due to altered perfusion in the setting of cirrhosis and porta hepatis adenopathy. Lymphomatous involvement cannot be excluded. Consider pre and post-contrast MRI follow-up at 2-3 months. A more aggressive approach would include random liver biopsy.  MRI abdomen with and without contrast 02/04/2022 showed IMPRESSION: 1. Examination is generally limited by breath motion artifact, particularly multiphasic contrast enhanced sequences. Within this limitation, cirrhotic morphology of the liver  without focal liver lesion. 2. 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 3. Trace ascites. 4. Numerous enlarged gastrohepatic ligament, portacaval, retroperitoneal, and small bowel mesenteric lymph nodes are unchanged, in keeping with history of lymphoma.  ASSESSMENT & PLAN:   72 yo with   1) CLL/SLL -02/28/2020 MM Breast ([id number]) revealed "Bilateral axillary adenopathy." -03/30/2020 Left axilla lymph node biopsy ([id number]) revealed "CHRONIC LYMPHOCYTIC LYMPHOMA/SMALL LYMPHOCYTIC LYMPHOMA".  -03/30/2020 Left Axilla Flow Pathology Report [id number]) revealed "Monoclonal B-cell population with coexpression of CD5 comprises 80% of all lymphocytes". -04/20/20 PET/CT revealed 1. Adenopathy within the neck, chest, abdomen, and pelvis, consistent with active lymphoma. Much of this is not significantly  hypermetabolic. Some upper abdominal nodes are moderately hypermetabolic.  2) Mild thrombocytopenia PLT 124k Most likely related to advanced liver cirrhosis, less likely related to CLL  PLAN: -discussed the lab results from today, 09/20/2022. CBC shows WBC of 39.9 K, hemoglobin of 11.1, hematocrit of 34.5, and platelets of 101. CMP stable. -Educated the patient on the symptoms for progressive CLL.  -Answered all of patient's questions regarding CLL and other general questions.  -Discussed the option of checking IgG level because she has been having recurrent respiratory illnesses. Will plan on getting IgG level today.  -Educated patient on what else could cause abnormal hemoglobin levels other than CLL.  -Educated the patient on what could cause iron deficiency.  -no acute indication to initiated CLL/SLL treatment at this time. -continue B complex.   FOLLOW-UP: Additional labs today RTC with Dr Irene Limbo with labs in 3 months  The total time spent in the appointment was 30 minutes* .  All of the patient's questions were answered with apparent satisfaction. The patient knows to call the clinic with any problems, questions or concerns.   Sullivan Lone MD MS AAHIVMS [facility] Hematology/Oncology Physician [facility]  .*Total Encounter Time as defined by the Centers for Medicare and Medicaid Services includes, in addition to the face-to-face time of a patient visit (documented in the note above) non-face-to-face time: obtaining and reviewing outside history, ordering and reviewing medications, tests or procedures, care coordination (communications with other health care professionals or caregivers) and documentation in the medical record.  I, Cleda Mccreedy, am acting as a Education administrator for Sullivan Lone, MD. .I have reviewed the above documentation for accuracy and completeness, and I agree with the above. Brunetta Genera MD

## 2022-09-22 ENCOUNTER — Encounter: Payer: Self-pay | Admitting: Hematology

## 2022-09-22 LAB — IGG, IGA, IGM
IgA: 15 mg/dL — ABNORMAL LOW (ref 64–422)
IgG (Immunoglobin G), Serum: 272 mg/dL — ABNORMAL LOW (ref 586–1602)
IgM (Immunoglobulin M), Srm: <5 mg/dL — ABNORMAL LOW (ref 26–217)

## 2022-09-24 ENCOUNTER — Encounter: Payer: Self-pay | Admitting: Internal Medicine

## 2022-09-26 ENCOUNTER — Other Ambulatory Visit (HOSPITAL_COMMUNITY): Payer: Self-pay

## 2022-09-26 ENCOUNTER — Other Ambulatory Visit: Payer: Self-pay | Admitting: Internal Medicine

## 2022-09-26 ENCOUNTER — Encounter: Payer: Self-pay | Admitting: Hematology

## 2022-09-26 MED ORDER — OMEPRAZOLE 40 MG PO CPDR
DELAYED_RELEASE_CAPSULE | ORAL | 3 refills | Status: DC
  Filled 2022-09-26: qty 180, 90d supply, fill #0
  Filled 2022-12-21: qty 180, 90d supply, fill #1
  Filled 2023-03-01: qty 180, 90d supply, fill #2
  Filled 2023-07-06: qty 180, 90d supply, fill #3

## 2022-09-26 MED ORDER — SPIRONOLACTONE 50 MG PO TABS
50.0000 mg | ORAL_TABLET | Freq: Every day | ORAL | 1 refills | Status: DC
  Filled 2022-09-26: qty 90, 90d supply, fill #0

## 2022-09-26 MED ORDER — GABAPENTIN 300 MG PO CAPS
ORAL_CAPSULE | ORAL | 3 refills | Status: DC
  Filled 2022-09-26: qty 180, 90d supply, fill #0
  Filled 2022-12-21: qty 180, 90d supply, fill #1
  Filled 2023-03-20: qty 180, 90d supply, fill #2
  Filled 2023-07-06: qty 180, 90d supply, fill #3

## 2022-09-27 ENCOUNTER — Other Ambulatory Visit (HOSPITAL_COMMUNITY): Payer: Self-pay

## 2022-09-28 ENCOUNTER — Ambulatory Visit: Payer: Self-pay | Admitting: General Surgery

## 2022-09-28 ENCOUNTER — Other Ambulatory Visit (HOSPITAL_COMMUNITY): Payer: Self-pay

## 2022-09-28 MED ORDER — BD PEN NEEDLE NANO 2ND GEN 32G X 4 MM MISC
5 refills | Status: DC
  Filled 2022-11-18: qty 400, 67d supply, fill #0
  Filled 2023-03-01: qty 400, 67d supply, fill #1
  Filled 2023-05-28: qty 400, 67d supply, fill #2

## 2022-09-28 MED ORDER — METFORMIN HCL ER 500 MG PO TB24
1000.0000 mg | ORAL_TABLET | Freq: Two times a day (BID) | ORAL | 3 refills | Status: DC
  Filled 2022-11-18: qty 360, 90d supply, fill #0
  Filled 2023-02-14: qty 360, 90d supply, fill #1
  Filled 2023-05-23: qty 360, 90d supply, fill #2

## 2022-09-28 MED ORDER — ONETOUCH DELICA PLUS LANCET33G MISC: 2 refills | Status: AC

## 2022-09-28 MED ORDER — GABAPENTIN 300 MG PO CAPS
600.0000 mg | ORAL_CAPSULE | Freq: Every day | ORAL | 3 refills | Status: DC
  Filled 2022-09-28: qty 180, 90d supply, fill #0

## 2022-09-28 MED ORDER — ROSUVASTATIN CALCIUM 10 MG PO TABS
10.0000 mg | ORAL_TABLET | Freq: Every day | ORAL | 2 refills | Status: DC
  Filled 2022-12-07: qty 90, 90d supply, fill #0
  Filled 2023-03-01: qty 90, 90d supply, fill #1

## 2022-09-28 MED ORDER — INSULIN LISPRO (1 UNIT DIAL) 100 UNIT/ML (KWIKPEN): 30.0000 [IU] | PEN_INJECTOR | Freq: Three times a day (TID) | SUBCUTANEOUS | 3 refills | Status: DC

## 2022-09-28 MED ORDER — GLUCOSE BLOOD VI STRP
ORAL_STRIP | 3 refills | Status: AC
  Filled 2022-09-28: qty 300, 100d supply, fill #0

## 2022-09-28 MED ORDER — HYDRALAZINE HCL 25 MG PO TABS
25.0000 mg | ORAL_TABLET | Freq: Two times a day (BID) | ORAL | 2 refills | Status: DC
  Filled 2022-11-07: qty 180, 90d supply, fill #0
  Filled 2023-02-06: qty 180, 90d supply, fill #1

## 2022-09-28 MED ORDER — NOVOLOG FLEXPEN 100 UNIT/ML ~~LOC~~ SOPN
30.0000 [IU] | PEN_INJECTOR | Freq: Three times a day (TID) | SUBCUTANEOUS | 4 refills | Status: DC
  Filled 2022-10-03: qty 15, 16d supply, fill #0

## 2022-09-28 MED ORDER — INSULIN ASPART 100 UNIT/ML FLEXPEN: 30.0000 [IU] | PEN_INJECTOR | Freq: Three times a day (TID) | SUBCUTANEOUS | 2 refills | Status: DC

## 2022-09-28 MED ORDER — IRBESARTAN 300 MG PO TABS
300.0000 mg | ORAL_TABLET | Freq: Every morning | ORAL | 3 refills | Status: DC
  Filled 2022-11-07: qty 90, 90d supply, fill #0
  Filled 2023-01-30: qty 90, 90d supply, fill #1
  Filled 2023-05-02: qty 90, 90d supply, fill #2

## 2022-09-28 MED ORDER — FLUTICASONE PROPIONATE 50 MCG/ACT NA SUSP: 2.0000 | Freq: Every morning | NASAL | 2 refills | Status: DC

## 2022-09-28 MED ORDER — INSULIN DEGLUDEC 200 UNIT/ML ~~LOC~~ SOPN
80.0000 [IU] | PEN_INJECTOR | Freq: Every day | SUBCUTANEOUS | 5 refills | Status: DC
  Filled 2022-10-07: qty 9, 23d supply, fill #0
  Filled 2022-10-08 (×2): qty 9, 11d supply, fill #0
  Filled 2022-10-10: qty 9, 23d supply, fill #0
  Filled 2022-10-10: qty 9, 22d supply, fill #0
  Filled 2022-10-25: qty 9, 23d supply, fill #1

## 2022-09-28 MED ORDER — OMEPRAZOLE 40 MG PO CPDR
40.0000 mg | DELAYED_RELEASE_CAPSULE | Freq: Two times a day (BID) | ORAL | 7 refills | Status: DC
  Filled 2022-09-28: qty 180, 90d supply, fill #0

## 2022-09-28 MED ORDER — CEPHALEXIN 500 MG PO CAPS
1000.0000 mg | ORAL_CAPSULE | ORAL | 0 refills | Status: AC
  Filled 2022-09-28: qty 20, 10d supply, fill #0

## 2022-09-28 MED ORDER — SERTRALINE HCL 50 MG PO TABS
50.0000 mg | ORAL_TABLET | Freq: Every day | ORAL | 1 refills | Status: DC
  Filled 2022-09-28 – 2022-10-27 (×3): qty 90, 90d supply, fill #0

## 2022-09-28 MED ORDER — BD PEN NEEDLE MICRO U/F 32G X 6 MM MISC: 3 refills | Status: DC

## 2022-09-28 MED ORDER — BUDESONIDE-FORMOTEROL FUMARATE 80-4.5 MCG/ACT IN AERO
2.0000 | INHALATION_SPRAY | Freq: Two times a day (BID) | RESPIRATORY_TRACT | 12 refills | Status: DC
  Filled 2022-10-03: qty 10.2, 30d supply, fill #0

## 2022-09-28 MED ORDER — AZELASTINE HCL 0.1 % NA SOLN: 1.0000 | Freq: Two times a day (BID) | NASAL | 11 refills | Status: DC

## 2022-09-29 ENCOUNTER — Other Ambulatory Visit: Payer: Self-pay

## 2022-09-29 ENCOUNTER — Other Ambulatory Visit (HOSPITAL_COMMUNITY): Payer: Self-pay

## 2022-09-29 ENCOUNTER — Encounter: Payer: Self-pay | Admitting: Pharmacist

## 2022-09-29 ENCOUNTER — Encounter: Payer: Self-pay | Admitting: Internal Medicine

## 2022-09-29 ENCOUNTER — Inpatient Hospital Stay (HOSPITAL_BASED_OUTPATIENT_CLINIC_OR_DEPARTMENT_OTHER): Payer: PPO | Admitting: Hematology

## 2022-09-29 DIAGNOSIS — C9110 Chronic lymphocytic leukemia of B-cell type not having achieved remission: Secondary | ICD-10-CM

## 2022-09-29 DIAGNOSIS — D801 Nonfamilial hypogammaglobulinemia: Secondary | ICD-10-CM | POA: Diagnosis not present

## 2022-09-29 DIAGNOSIS — D500 Iron deficiency anemia secondary to blood loss (chronic): Secondary | ICD-10-CM

## 2022-09-29 NOTE — Progress Notes (Unsigned)
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  INTERVAL HISTORY: .Marland KitchenI connected with Janice [name] on 09/29/2022 at  2:00 PM EST by telephone visit and verified that I am speaking with the correct person using two identifiers.   I discussed the limitations, risks, security and privacy concerns of performing an evaluation and management service by telemedicine and the availability of in-person appointments. I also discussed with the patient that there may be a patient responsible charge related to this service. The patient expressed understanding and agreed to proceed.   Other persons participating in the visit and their role in the encounter: none   Patient's location: home  Provider's location: [address] [facility]   Chief Complaint: discussion of treatment of newly noted severe hypogammaglobulinemia  Patient was called to discuss her recent lab results from 09/20/2022 which showed severe hypogammaglobinemia with an IgG level of 272 IgA of 15 and IgM of less than 5. Patient has been having recurrent sinopulmonary infections and we discussed the option of IVIG replacement to reduce the risk of her recurrent infections in the setting of significant hypogammaglobinemia recurrent infections with CLL. She is enthusiastic about starting IVIG replacement monthly.  Prescription close and cons of IVIG replacement and potential adverse effects including but not limited to allergic reactions, aseptic meningitis, hemolysis etc.. We also discussed the importance of starting her first dose of IVIG prior to her upcoming umbilical hernia surgery.  Also noted to be iron deficient and we discussed IV iron  replacement in the context of upcoming surgery with expected blood loss.  Patient is agreeable to this as well.  MEDICAL HISTORY:  Past Medical History:  Diagnosis Date   Allergy    Asthma    seasonal   Cellulitis 2013   Left toe   Cirrhosis ([place])    Common migraine    History of   Complication of anesthesia    Per pt/had breathing problems with "block" during rotator cuff surgery. 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   DDD (degenerative disc disease)    Depression    denies takes paxil for migraines   Diabetes mellitus    type 2   Diabetic peripheral neuropathy (HCC)    Diverticulitis    Diverticulosis    DJD (degenerative joint disease)    Esophageal varices (HCC)    GERD (gastroesophageal reflux disease)    History of colon polyps    hyperplastic   History of gastric polyp    Hyperlipidemia    Hypertension    Iron deficiency anemia    Obesity    OSA on CPAP    cpap   Peripheral neuropathy    Pneumonia    april 2020  mild   Primary localized osteoarthritis of left knee 03/27/2019   Sensorineural hearing loss    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[place])     SURGICAL HISTORY: Past Surgical History:  Procedure Laterality Date   ANKLE SURGERY     Left    COLONOSCOPY  05/2018   LEEP N/A 09/14/2018   Procedure: LOOP ELECTROSURGICAL EXCISION PROCEDURE (LEEP);  Surgeon: Arvella Nigh, MD;  [facility];  Service: Gynecology;  Laterality: N/A;   MASS EXCISION Right 06/24/2021   Procedure: EXCISION RIGHT POSTERIOR NECK MASS;  Surgeon: Kieth Brightly, [name], MD;  Location: [facility];  Service: General;  Laterality: Right;   ROTATOR CUFF REPAIR Bilateral 2012, 2015   TONSILLECTOMY     TOTAL KNEE ARTHROPLASTY Left 04/08/2019   Procedure: TOTAL KNEE ARTHROPLASTY;  Surgeon: Elsie Saas, MD;  Location: [facility];  Service: Orthopedics;  Laterality: Left;   UPPER GI ENDOSCOPY  05/2018    SOCIAL HISTORY: Social History   Socioeconomic History   Marital  status: Widowed    Spouse name: Not on file   Number of children: 0   Years of education: 3 years college   Highest education level: Not on file  Occupational History   Occupation: Unemployed  Tobacco Use   Smoking status: Never   Smokeless tobacco: Never  Vaping Use   Vaping Use: Never used  Substance and Sexual Activity   Alcohol use: Not Currently    Comment: very rare   Drug use: No   Sexual activity: Not Currently    Birth control/protection: None, Post-menopausal  Other Topics Concern   Not on file  Social History Narrative   1 caffeine drink daily    Right-handed   widow (husband died from [place] cirrhosis)   Social Determinants of Health   Financial Resource Strain: Medium Risk (08/20/2020)   Overall Financial Resource Strain (CARDIA)    Difficulty of Paying Living Expenses: Somewhat hard  Food Insecurity: No Food Insecurity (08/20/2020)   Hunger Vital Sign    Worried About Running Out of Food in the Last Year: Never true    [place] in the Last Year: Never true  Transportation Needs: No Transportation Needs (08/20/2020)   PRAPARE - Hydrologist (Medical): No    Lack of Transportation (Non-Medical): No  Physical Activity: Not on file  Stress: Not on file  Social Connections: Socially Integrated (08/20/2020)   Social Connection and Isolation Panel [NHANES]    Frequency of Communication with Friends and Family: More than three times a week    Frequency of Social Gatherings with Friends and Family: More than three times a week    Attends Religious Services: More than 4 times per year    Active Member of [employer] or Organizations: Yes    Attends Music therapist: More than 4 times per year    Marital Status: Married  Human resources officer Violence: Not on file    FAMILY HISTORY: Family History  Problem Relation Age of Onset   Breast cancer Mother    Bone cancer Mother    Heart failure Father    Diabetes Mellitus II Father     Hypertension Father    Diabetes Mellitus II Sister    Obesity Sister    Other Sister        retina problem   Hypertension Brother    Diabetes Mellitus II Brother    Colon cancer Neg Hx    Rectal cancer Neg Hx    Stomach cancer Neg Hx    Esophageal cancer Neg Hx    Pancreatic cancer Neg Hx    Liver disease Neg Hx     ALLERGIES:  is allergic to benzonatate, fluticasone furoate-vilanterol, iodine, latex, simvastatin, bupropion, levofloxacin, penicillins, povidone-iodine, amoxicillin, chlorthalidone, diflucan [fluconazole], povidone iodine, amoxicillin-pot clavulanate, and doxycycline.  MEDICATIONS:  Current Outpatient Medications  Medication Sig Dispense Refill   albuterol (VENTOLIN HFA) 108 (90 Base) MCG/ACT inhaler Inhale 1-2 puffs into the lungs every 6 (six) hours as needed (Cough). 18 g 0  amLODipine (NORVASC) 10 MG tablet Take 10 mg by mouth daily.     aspirin EC 81 MG tablet Take 1 tablet (81 mg total) by mouth daily. Swallow whole. 90 tablet 3   azelastine (ASTELIN) 0.1 % nasal spray Place 1 spray into both nostrils 2 (two) times daily. Use in each nostril as directed 9 mL 11   b complex vitamins capsule Take 1 capsule by mouth daily.     budesonide-formoterol (SYMBICORT) 80-4.5 MCG/ACT inhaler Take 2 puffs first thing in am and then another 2 puffs about 12 hours later. 1 each 12   Calcium-Magnesium-Zinc (CAL-MAG-ZINC PO) Take 2 tablets by mouth daily.     Carboxymethylcellul-Glycerin (REFRESH OPTIVE PF OP) Place 1 drop into both eyes daily as needed (dry eyes). Non-pres     Coenzyme Q10 (CO Q-10) 100 MG CAPS Take 100 mg by mouth daily.     Continuous Blood Gluc Sensor (FREESTYLE LIBRE 2 SENSOR) MISC Replace every 14 days and use to check blood sugar continuously topically change every 14 days to monitor blood glucose continuously for 84 days     desoximetasone (TOPICORT) 0.25 % cream Apply 1 application topically 4 (four) times daily as needed (rash).     diclofenac sodium  (VOLTAREN) 1 % GEL Apply 2 g topically 2 (two) times daily as needed (knee pain/ foot /Triger finger).  2   fluticasone (FLONASE) 50 MCG/ACT nasal spray Place 1 spray into both nostrils daily.     gabapentin (NEURONTIN) 300 MG capsule 600 mg daily.     HUMALOG KWIKPEN 100 UNIT/ML KwikPen Inject into the skin.     hydrALAZINE (APRESOLINE) 25 MG tablet Take 1 tablet (25 mg total) by mouth 2 (two) times daily. 180 tablet 2   insulin degludec (TRESIBA FLEXTOUCH) 200 UNIT/ML FlexTouch Pen inject 80 units Subcutaneous once daily for 30 days     irbesartan (AVAPRO) 300 MG tablet Take 300 mg by mouth daily.     LORazepam (ATIVAN) 0.5 MG tablet Take 1-2 tablets (0.5-1 mg total) by mouth once as needed for up to 1 dose (prior to MRI or CT scan for claustrophobia). 4 tablet 0   metFORMIN (GLUCOPHAGE-XR) 500 MG 24 hr tablet Take 1,000 mg by mouth 2 (two) times daily.     omeprazole (PRILOSEC) 40 MG capsule Take 40 mg by mouth 2 (two) times daily.     rosuvastatin (CRESTOR) 10 MG tablet Take 1 tablet (10 mg total) by mouth daily. (Patient taking differently: Take 10 mg by mouth daily with supper.) 90 tablet 3   sertraline (ZOLOFT) 50 MG tablet Take 50 mg by mouth daily. Takes 1/2 tablet daily     spironolactone (ALDACTONE) 50 MG tablet Take 1 tablet by mouth once daily 90 tablet 0   terconazole (TERAZOL 3) 0.8 % vaginal cream Place 1 applicator vaginally as needed (yeast infections).      No current facility-administered medications for this visit.    REVIEW OF SYSTEMS:   . 10 Point review of Systems was done is negative except as noted above.  PHYSICAL EXAMINATION: Telemedicine visit  LABORATORY DATA:  I have reviewed the data as listed  .    Latest Ref Rng & Units 09/20/2022   12:10 PM 07/20/2022    7:33 PM 07/12/2022   10:52 AM  CBC  WBC 4.0 - 10.5 K/uL 39.9  31.0  33.6   Hemoglobin 12.0 - 15.0 g/dL 11.1  9.7  10.0   Hematocrit 36.0 - 46.0 % 34.5  32.5  33.8   Platelets 150 - 400 K/uL 101  97   97    . CBC    Component Value Date/Time   WBC 39.9 (H) 09/20/2022 1210   WBC 31.0 (H) 07/20/2022 1933   RBC 3.99 09/20/2022 1210   HGB 11.1 (L) 09/20/2022 1210   HGB 10.3 (L) 10/18/2021 1222   HCT 34.5 (L) 09/20/2022 1210   HCT 33.0 (L) 10/18/2021 1222   PLT 101 (L) 09/20/2022 1210   PLT 100 (LL) 10/18/2021 1222   MCV 86.5 09/20/2022 1210   MCV 84 10/18/2021 1222   MCH 27.8 09/20/2022 1210   MCHC 32.2 09/20/2022 1210   RDW 18.2 (H) 09/20/2022 1210   RDW 15.1 10/18/2021 1222   LYMPHSABS 30.6 (H) 09/20/2022 1210   MONOABS 1.4 (H) 09/20/2022 1210   EOSABS 0.2 09/20/2022 1210   BASOSABS 0.2 (H) 09/20/2022 1210     .    Latest Ref Rng & Units 09/20/2022   12:10 PM 07/20/2022    7:33 PM 07/12/2022   10:52 AM  CMP  Glucose 70 - 99 mg/dL 97  176  172   BUN 8 - 23 mg/dL '23  13  15   '$ Creatinine 0.44 - 1.00 mg/dL 0.82  0.69  0.82   Sodium 135 - 145 mmol/L 131  135  135   Potassium 3.5 - 5.1 mmol/L 5.2  4.5  5.1   Chloride 98 - 111 mmol/L 100  101  102   CO2 22 - 32 mmol/L '25  24  28   '$ Calcium 8.9 - 10.3 mg/dL 9.4  8.6  9.1   Total Protein 6.5 - 8.1 g/dL 6.7  6.5  6.4   Total Bilirubin 0.3 - 1.2 mg/dL 0.5  0.6  0.5   Alkaline Phos 38 - 126 U/L 71  80  71   AST 15 - 41 U/L '19  21  17   '$ ALT 0 - 44 U/L '27  23  20    '$ . Lab Results  Component Value Date   LDH 187 09/20/2022   . Lab Results  Component Value Date   IRON 29 06/03/2022   TIBC 529 (H) 06/03/2022   IRONPCTSAT 6 (L) 06/03/2022   (Iron and TIBC)  Lab Results  Component Value Date   FERRITIN 22 09/20/2022    04/08/2020 FISH/CLL Panel:   03/30/2020 Left Axilla Flow Pathology Report [id number]):   03/30/2020 Left axilla lymph node Bx ([id number]):   RADIOGRAPHIC STUDIES: I have personally reviewed the radiological images as listed and agreed with the findings in the report. No results found.  MRI abdomen with and without contrast 07/08/2021 showed IMPRESSION: 1. Moderate motion and patient  body habitus degraded exam. 2. Extensive abdominal adenopathy, similar to slightly increased compared to 05/01/2020 PET. 3. Cirrhosis and hepatosplenomegaly with suspicion of periesophageal varices. 4. Nonspecific hypoenhancing or nonenhancing splenic lesions. These could simply represent Janice [name] bodies in the setting of cirrhosis. Lymphomatous involvement cannot be excluded. 5. Heterogeneous enhancement throughout the liver, without well-defined suspicious mass. Possibly due to altered perfusion in the setting of cirrhosis and porta hepatis adenopathy. Lymphomatous involvement cannot be excluded. Consider pre and post-contrast MRI follow-up at 2-3 months. A more aggressive approach would include random liver biopsy.  MRI abdomen with and without contrast 02/04/2022 showed IMPRESSION: 1. Examination is generally limited by breath motion artifact, particularly multiphasic contrast enhanced sequences. Within this limitation, cirrhotic morphology of the liver without focal liver lesion. 2. Splenomegaly with numerous  small unchanged hypoenhancing splenic lesions, again most likely Janice [name] bodies in the setting of cirrhosis. 3. Trace ascites. 4. Numerous enlarged gastrohepatic ligament, portacaval, retroperitoneal, and small bowel mesenteric lymph nodes are unchanged, in keeping with history of lymphoma.  ASSESSMENT & PLAN:   72 yo with   1) CLL/SLL -02/28/2020 MM Breast ([id number]) revealed "Bilateral axillary adenopathy." -03/30/2020 Left axilla lymph node biopsy ([id number]) revealed "CHRONIC LYMPHOCYTIC LYMPHOMA/SMALL LYMPHOCYTIC LYMPHOMA".  -03/30/2020 Left Axilla Flow Pathology Report [id number]) revealed "Monoclonal B-cell population with coexpression of CD5 comprises 80% of all lymphocytes". -04/20/20 PET/CT revealed 1. Adenopathy within the neck, chest, abdomen, and pelvis, consistent with active lymphoma. Much of this is not significantly hypermetabolic. Some  upper abdominal nodes are moderately hypermetabolic.  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 Lab Results  Component Value Date   IRON 29 06/03/2022   TIBC 529 (H) 06/03/2022   IRONPCTSAT 6 (L) 06/03/2022   (Iron and TIBC)  Lab Results  Component Value Date   FERRITIN 22 09/20/2022    PLAN: -Discussed quantitative immunoglobulin levels which show severe hypogammaglobinemia along with recurrent sinopulmonary infections in the context of CLL. -We discussed that this would be an indication for consideration of IVIG replacement monthly.  Discussed the pros and cons of IVIG and potential adverse effects. -Patient is enthusiastic about initiating IVIG replacement and we shall start her on 40 mg/kg monthly with a plan to give her her first dose prior to her upcoming surgery on 04/16/8849 for umbilical hernia. -With a ferritin of 22 and iron saturation of 6%  -We discussed and patient is agreeable with replacing her iron aggressively with IV Venofer 300 mg weekly x 3 doses with Claritin Tylenol and Pepcid as premedications. -No acute indication for initiating CLL/SLL treatment at this time -Patient recommended to stay up to speed with all her age-appropriate vaccinations with PCP. -She has been recommended hepatitis A and hepatitis B vaccinations and can have these 2 weeks before or 2 weeks after her IVIG treatments. -continue B complex.   FOLLOW-UP:   IVIG '400mg'$ /kg every 4 weeks x 6 starting in 7-10 days IV venofer '300mg'$  weekly x 2 doses ASAP (patient having surgery on 2/4) RTC with Dr Irene Limbo in 2 months with labs   The total time  spent in the appointment was 25 minutes*.  All of the patient's questions were answered with apparent satisfaction. The patient knows to call the clinic with any problems, questions or concerns.   Sullivan Lone MD MS AAHIVMS [facility] Hematology/Oncology Physician [facility]  .*Total Encounter Time as defined by the Centers for Medicare and Medicaid Services includes, in addition to the face-to-face time of a patient visit (documented in the note above) non-face-to-face time: obtaining and reviewing outside history, ordering and reviewing medications, tests or procedures, care coordination (communications with other health care professionals or caregivers) and documentation in the medical record.

## 2022-09-30 NOTE — Patient Instructions (Signed)
DUE TO COVID-19 ONLY TWO VISITORS  (aged 72 and older)  ARE ALLOWED TO COME WITH YOU AND STAY IN THE WAITING ROOM ONLY DURING PRE OP AND PROCEDURE.   **NO VISITORS ARE ALLOWED IN THE SHORT STAY AREA OR RECOVERY ROOM!!**  IF YOU WILL BE ADMITTED INTO THE HOSPITAL YOU ARE ALLOWED ONLY FOUR SUPPORT PEOPLE DURING VISITATION HOURS ONLY (7 AM -8PM)   The support person(s) must pass our screening, gel in and out, and wear a mask at all times, including in the patient's room. Patients must also wear a mask when staff or their support person are in the room. Visitors GUEST BADGE MUST BE WORN VISIBLY  One adult visitor may remain with you overnight and MUST be in the room by 8 P.M.     Your procedure is scheduled on: 10/20/22   Report to [facility] Main Entrance    Report to admitting at : 5:15 AM   Call this number if you have problems the morning of surgery [phone]   Do not eat food :After Midnight.   After Midnight you may have the following liquids until : 4:30 AM DAY OF SURGERY  Water Black Coffee (sugar ok, NO MILK/CREAM OR CREAMERS)  Tea (sugar ok, NO MILK/CREAM OR CREAMERS) regular and decaf                             Plain Jell-O (NO RED)                                           Fruit ices (not with fruit pulp, NO RED)                                     Popsicles (NO RED)                                                                  Juice: apple, WHITE grape, WHITE cranberry Sports drinks like Gatorade (NO RED)    Oral Hygiene is also important to reduce your risk of infection.                                    Remember - BRUSH YOUR TEETH THE MORNING OF SURGERY WITH YOUR REGULAR TOOTHPASTE  DENTURES WILL BE REMOVED PRIOR TO SURGERY PLEASE DO NOT APPLY "Poly grip" OR ADHESIVES!!!   Do NOT smoke after Midnight   Take these medicines the morning of surgery with A SIP OF WATER: apresoline,amlodipine,omeprazole.Use inhalers as usual.Lorazepam as needed.  How to  Manage Your Diabetes Before and After Surgery  Why is it important to control my blood sugar before and after surgery? Improving blood sugar levels before and after surgery helps healing and can limit problems. A way of improving blood sugar control is eating a healthy diet by:  Eating less sugar and carbohydrates  Increasing activity/exercise  Talking with your doctor about reaching your blood sugar goals High blood sugars (  greater than 180 mg/dL) can raise your risk of infections and slow your recovery, so you will need to focus on controlling your diabetes during the weeks before surgery. Make sure that the doctor who takes care of your diabetes knows about your planned surgery including the date and location.  How do I manage my blood sugar before surgery? Check your blood sugar at least 4 times a day, starting 2 days before surgery, to make sure that the level is not too high or low. Check your blood sugar the morning of your surgery when you wake up and every 2 hours until you get to the Short Stay unit. If your blood sugar is less than 70 mg/dL, you will need to treat for low blood sugar: Do not take insulin. Treat a low blood sugar (less than 70 mg/dL) with  cup of clear juice (cranberry or apple), 4 glucose tablets, OR glucose gel. Recheck blood sugar in 15 minutes after treatment (to make sure it is greater than 70 mg/dL). If your blood sugar is not greater than 70 mg/dL on recheck, call [phone] for further instructions. Report your blood sugar to the short stay nurse when you get to Short Stay.  If you are admitted to the hospital after surgery: Your blood sugar will be checked by the staff and you will probably be given insulin after surgery (instead of oral diabetes medicines) to make sure you have good blood sugar levels. The goal for blood sugar control after surgery is 80-180 mg/dL.   WHAT DO I DO ABOUT MY DIABETES MEDICATION?  Do not take oral diabetes medicines  (pills) the morning of surgery.  THE NIGHT BEFORE SURGERY, DO NOT take novolog insulin night dose.      THE MORNING OF SURGERY,DO NOT TAKE ANY ORAL DIABETIC MEDICATIONS DAY OF YOUR SURGERY  If your CBG is greater than 220 mg/dL, you may take  of your sliding scale  (correction) dose of insulin.    Bring CPAP mask and tubing day of surgery.                              You may not have any metal on your body including hair pins, jewelry, and body piercing             Do not wear make-up, lotions, powders, perfumes/cologne, or deodorant  Do not wear nail polish including gel and S&S, artificial/acrylic nails, or any other type of covering on natural nails including finger and toenails. If you have artificial nails, gel coating, etc. that needs to be removed by a nail salon please have this removed prior to surgery or surgery may need to be canceled/ delayed if the surgeon/ anesthesia feels like they are unable to be safely monitored.   Do not shave  48 hours prior to surgery.    Do not bring valuables to the hospital. [place].   Contacts, glasses, or bridgework may not be worn into surgery.   Bring small overnight bag day of surgery.   DO NOT [place]. PHARMACY WILL DISPENSE MEDICATIONS LISTED ON YOUR MEDICATION LIST TO YOU DURING YOUR ADMISSION [place]!    Patients discharged on the day of surgery will not be allowed to drive home.  Someone NEEDS to stay with you for the  first 24 hours after anesthesia.   Special Instructions: Bring a copy of your healthcare power of attorney and living will documents         the day of surgery if you haven't scanned them before.              Please read over the following fact sheets you were given: IF YOU HAVE QUESTIONS ABOUT YOUR PRE-OP INSTRUCTIONS PLEASE CALL [phone]    [facility] - Preparing for Surgery Before surgery, you can play an  important role.  Because skin is not sterile, your skin needs to be as free of germs as possible.  You can reduce the number of germs on your skin by washing with CHG (chlorahexidine gluconate) soap before surgery.  CHG is an antiseptic cleaner which kills germs and bonds with the skin to continue killing germs even after washing. Please DO NOT use if you have an allergy to CHG or antibacterial soaps.  If your skin becomes reddened/irritated stop using the CHG and inform your nurse when you arrive at Short Stay. Do not shave (including legs and underarms) for at least 48 hours prior to the first CHG shower.  You may shave your face/neck. Please follow these instructions carefully:  1.  Shower with CHG Soap the night before surgery and the  morning of Surgery.  2.  If you choose to wash your hair, wash your hair first as usual with your  normal  shampoo.  3.  After you shampoo, rinse your hair and body thoroughly to remove the  shampoo.                           4.  Use CHG as you would any other liquid soap.  You can apply chg directly  to the skin and wash                       Gently with a scrungie or clean washcloth.  5.  Apply the CHG Soap to your body ONLY FROM THE NECK DOWN.   Do not use on face/ open                           Wound or open sores. Avoid contact with eyes, ears mouth and genitals (private parts).                       Wash face,  Genitals (private parts) with your normal soap.             6.  Wash thoroughly, paying special attention to the area where your surgery  will be performed.  7.  Thoroughly rinse your body with warm water from the neck down.  8.  DO NOT shower/wash with your normal soap after using and rinsing off  the CHG Soap.                9.  Pat yourself dry with a clean towel.            10.  Wear clean pajamas.            11.  Place clean sheets on your bed the night of your first shower and do not  sleep with pets. Day of Surgery : Do not apply any  lotions/deodorants the morning of surgery.  Please wear clean clothes to the hospital/surgery center.  FAILURE TO FOLLOW THESE INSTRUCTIONS MAY RESULT IN THE CANCELLATION OF YOUR SURGERY PATIENT SIGNATURE_________________________________  NURSE SIGNATURE__________________________________  ________________________________________________________________________

## 2022-10-01 ENCOUNTER — Other Ambulatory Visit: Payer: Self-pay | Admitting: Internal Medicine

## 2022-10-01 DIAGNOSIS — K7581 Nonalcoholic steatohepatitis (NASH): Secondary | ICD-10-CM

## 2022-10-03 ENCOUNTER — Encounter (HOSPITAL_COMMUNITY): Payer: Self-pay

## 2022-10-03 ENCOUNTER — Encounter (HOSPITAL_COMMUNITY)
Admission: RE | Admit: 2022-10-03 | Discharge: 2022-10-03 | Disposition: A | Discharge status: Home / Self Care | Payer: PPO | Source: Ambulatory Visit | Attending: General Surgery | Admitting: General Surgery

## 2022-10-03 ENCOUNTER — Encounter: Payer: Self-pay | Admitting: Hematology

## 2022-10-03 ENCOUNTER — Other Ambulatory Visit: Payer: Self-pay

## 2022-10-03 ENCOUNTER — Other Ambulatory Visit: Payer: HMO

## 2022-10-03 VITALS — BP 97/57 | HR 78 | Temp 99.2°F | Ht 64.5 in | Wt 223.0 lb

## 2022-10-03 DIAGNOSIS — K7581 Nonalcoholic steatohepatitis (NASH): Secondary | ICD-10-CM

## 2022-10-03 DIAGNOSIS — Z794 Long term (current) use of insulin: Secondary | ICD-10-CM | POA: Insufficient documentation

## 2022-10-03 DIAGNOSIS — K7460 Unspecified cirrhosis of liver: Secondary | ICD-10-CM | POA: Diagnosis not present

## 2022-10-03 DIAGNOSIS — E119 Type 2 diabetes mellitus without complications: Secondary | ICD-10-CM

## 2022-10-03 DIAGNOSIS — Z01818 Encounter for other preprocedural examination: Secondary | ICD-10-CM | POA: Insufficient documentation

## 2022-10-03 DIAGNOSIS — I10 Essential (primary) hypertension: Secondary | ICD-10-CM | POA: Insufficient documentation

## 2022-10-03 HISTORY — DX: Chronic kidney disease, unspecified: N18.9

## 2022-10-03 HISTORY — DX: Atherosclerotic heart disease of native coronary artery without angina pectoris: I25.10

## 2022-10-03 LAB — CBC
HCT: 33.7 % — ABNORMAL LOW (ref 36.0–46.0)
Hemoglobin: 10.3 g/dL — ABNORMAL LOW (ref 12.0–15.0)
MCH: 27.4 pg (ref 26.0–34.0)
MCHC: 30.6 g/dL (ref 30.0–36.0)
MCV: 89.6 fL (ref 80.0–100.0)
Platelets: 103 10*3/uL — ABNORMAL LOW (ref 150–400)
RBC: 3.76 MIL/uL — ABNORMAL LOW (ref 3.87–5.11)
RDW: 17.3 % — ABNORMAL HIGH (ref 11.5–15.5)
WBC: 36.4 10*3/uL — ABNORMAL HIGH (ref 4.0–10.5)
nRBC: 0.1 % (ref 0.0–0.2)

## 2022-10-03 LAB — BASIC METABOLIC PANEL
Anion gap: 8 (ref 5–15)
BUN: 20 mg/dL (ref 8–23)
CO2: 25 mmol/L (ref 22–32)
Calcium: 8.9 mg/dL (ref 8.9–10.3)
Chloride: 99 mmol/L (ref 98–111)
Creatinine, Ser: 0.77 mg/dL (ref 0.44–1.00)
GFR, Estimated: >60 mL/min (ref >60)
Glucose, Bld: 209 mg/dL — ABNORMAL HIGH (ref 70–99)
Potassium: 6.1 mmol/L — ABNORMAL HIGH (ref 3.5–5.1)
Sodium: 132 mmol/L — ABNORMAL LOW (ref 135–145)

## 2022-10-03 LAB — HEMOGLOBIN A1C
Hgb A1c MFr Bld: 7.5 % — ABNORMAL HIGH (ref 4.8–5.6)
Mean Plasma Glucose: 168.55 mg/dL

## 2022-10-03 LAB — GLUCOSE, CAPILLARY: Glucose-Capillary: 209 mg/dL — ABNORMAL HIGH (ref 70–99)

## 2022-10-03 NOTE — Progress Notes (Addendum)
Lab. Results: Potassium: 6.1 WBC: 36.4

## 2022-10-03 NOTE — Progress Notes (Signed)
For Short Stay: [place] appointment date:  Bowel Prep reminder:   For Anesthesia: PCP - Dr. Prince Solian Cardiologist - [facility].: Dr. Jarrett Ables. LOV: 6/8/23Celeste Cantwell: PA  Chest x-ray - 07/20/22 EKG -  Stress Test -  ECHO - 11/08/21 Cardiac Cath -  Pacemaker/ICD device last checked: Pacemaker orders received: Device Rep notified:  Spinal Cord Stimulator:  Sleep Study - Yes CPAP - Yes  Fasting Blood Sugar - 60' - 150's Checks Blood Sugar : continuous free libre Date and result of last Hgb A1c-  Last dose of GLP1 agonist-  GLP1 instructions:   Last dose of SGLT-2 inhibitors-  SGLT-2 instructions:   Blood Thinner Instructions: Aspirin Instructions: Last Dose:  Activity level: Can go up a flight of stairs and activities of daily living without stopping and without chest pain and/or shortness of breath   Able to exercise without chest pain and/or shortness of breath    Anesthesia review: Hx: DIA,HTN,CAD,OSA(CPAP)  Patient denies shortness of breath, fever, cough and chest pain at PAT appointment   Patient verbalized understanding of instructions that were given to them at the PAT appointment. Patient was also instructed that they will need to review over the PAT instructions again at home before surgery.

## 2022-10-04 ENCOUNTER — Other Ambulatory Visit (HOSPITAL_COMMUNITY): Payer: Self-pay

## 2022-10-04 ENCOUNTER — Other Ambulatory Visit: Payer: Self-pay

## 2022-10-04 LAB — HEPATITIS B CORE ANTIBODY, TOTAL: Hep B Core Total Ab: NONREACTIVE

## 2022-10-04 LAB — HEPATITIS B SURFACE ANTIBODY,QUALITATIVE: Hep B S Ab: NONREACTIVE

## 2022-10-04 LAB — HEPATITIS A ANTIBODY, TOTAL: Hepatitis A AB,Total: REACTIVE — AB

## 2022-10-05 ENCOUNTER — Other Ambulatory Visit: Payer: Self-pay

## 2022-10-05 ENCOUNTER — Encounter: Payer: Self-pay | Admitting: Hematology

## 2022-10-05 ENCOUNTER — Inpatient Hospital Stay: Payer: PPO

## 2022-10-05 ENCOUNTER — Other Ambulatory Visit (HOSPITAL_COMMUNITY): Payer: Self-pay

## 2022-10-05 ENCOUNTER — Ambulatory Visit (HOSPITAL_BASED_OUTPATIENT_CLINIC_OR_DEPARTMENT_OTHER): Payer: PPO | Admitting: Physician Assistant

## 2022-10-05 VITALS — BP 141/45 | HR 76 | Temp 98.5°F | Resp 18

## 2022-10-05 DIAGNOSIS — D500 Iron deficiency anemia secondary to blood loss (chronic): Secondary | ICD-10-CM

## 2022-10-05 DIAGNOSIS — D508 Other iron deficiency anemias: Secondary | ICD-10-CM

## 2022-10-05 DIAGNOSIS — R110 Nausea: Secondary | ICD-10-CM

## 2022-10-05 DIAGNOSIS — C9110 Chronic lymphocytic leukemia of B-cell type not having achieved remission: Secondary | ICD-10-CM

## 2022-10-05 DIAGNOSIS — D801 Nonfamilial hypogammaglobulinemia: Secondary | ICD-10-CM

## 2022-10-05 MED ORDER — ONDANSETRON 4 MG PO TBDP
4.0000 mg | ORAL_TABLET | Freq: Three times a day (TID) | ORAL | 0 refills | Status: DC | PRN
  Filled 2022-10-05 (×2): qty 20, 7d supply, fill #0

## 2022-10-05 MED ORDER — SODIUM CHLORIDE 0.9 % IV SOLN: Freq: Once | INTRAVENOUS | Status: AC

## 2022-10-05 MED ORDER — DEXTROSE 5 % IV SOLN: Freq: Once | INTRAVENOUS | Status: AC

## 2022-10-05 MED ORDER — SODIUM CHLORIDE 0.9 % IV SOLN
300.0000 mg | Freq: Once | INTRAVENOUS | Status: AC
  Administered 2022-10-05: 300 mg via INTRAVENOUS
  Filled 2022-10-05: qty 300

## 2022-10-05 MED ORDER — DIPHENHYDRAMINE HCL 25 MG PO CAPS
25.0000 mg | ORAL_CAPSULE | ORAL | Status: DC
  Administered 2022-10-05: 25 mg via ORAL
  Filled 2022-10-05: qty 1

## 2022-10-05 MED ORDER — ACETAMINOPHEN 325 MG PO TABS: 650.0000 mg | ORAL_TABLET | ORAL | Status: DC

## 2022-10-05 MED ORDER — ONDANSETRON HCL 4 MG/2ML IJ SOLN
4.0000 mg | Freq: Once | INTRAMUSCULAR | Status: AC
  Administered 2022-10-05: 4 mg via INTRAVENOUS
  Filled 2022-10-05: qty 2

## 2022-10-05 MED ORDER — ACETAMINOPHEN 325 MG PO TABS
650.0000 mg | ORAL_TABLET | Freq: Once | ORAL | Status: AC
  Administered 2022-10-05: 650 mg via ORAL
  Filled 2022-10-05: qty 2

## 2022-10-05 MED ORDER — LORATADINE 10 MG PO TABS
10.0000 mg | ORAL_TABLET | Freq: Once | ORAL | Status: AC
  Administered 2022-10-05: 10 mg via ORAL
  Filled 2022-10-05: qty 1

## 2022-10-05 MED ORDER — IMMUNE GLOBULIN (HUMAN) 10 GM/100ML IV SOLN
400.0000 mg/kg | INTRAVENOUS | Status: DC
  Administered 2022-10-05: 40 g via INTRAVENOUS
  Filled 2022-10-05: qty 400

## 2022-10-05 MED ORDER — METHYLPREDNISOLONE SODIUM SUCC 125 MG IJ SOLR
60.0000 mg | INTRAMUSCULAR | Status: DC
  Administered 2022-10-05: 60 mg via INTRAVENOUS
  Filled 2022-10-05: qty 2

## 2022-10-05 NOTE — Progress Notes (Signed)
Patient finished with IVIG, on post infusion observation, became very nauseated, no vomiting Anda Kraft PA @ chairside to see patient.

## 2022-10-05 NOTE — Patient Instructions (Signed)

## 2022-10-05 NOTE — Progress Notes (Signed)
Per Irene Limbo MD, ok to give IVIG today. Pt will need to wait two weeks post IVIG to receive Hepatitis vaccination. This will put pt's vaccination date after her surgery. Ok per [place] to delay vaccination. Pt expressed understanding.   Per [facility], pt qualified as PI and can be bumped up to max infusion rate for first time IVIG.

## 2022-10-06 ENCOUNTER — Encounter: Payer: Self-pay | Admitting: Hematology

## 2022-10-06 ENCOUNTER — Telehealth: Payer: Self-pay | Admitting: Internal Medicine

## 2022-10-06 NOTE — Progress Notes (Signed)
Symptom Management Consult note [place]    Patient Care Team: Prince Solian, MD as PCP - General (Internal Medicine)    Name of the patient: Janice [name]  [id number]  Jun 24, 1951   Date of visit: 10/05/2022   Chief Complaint/Reason for visit: nausea   Current Therapy: IVIG and IV venofer 300 mg.   Last treatment:  Treatment 1 today. Previous iron infusion 07/05/22.    ASSESSMENT & PLAN: Patient is a 72 y.o. 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 Irene Limbo.  I have viewed most recent oncology note and lab work.    #Hypogammaglobinemia associated with CLL/SLL  -Getting first time IVIG. Also received iron infusion which she has had before and tolerated well. - Next appointment with oncologist is 12/02/22.   #Nausea -Patient complaining of nausea without vomiting while on observation after treatment. -Exam reveals nontender abdomen.  Does have abdominal distention which is baseline per patient. -Patient given IV Zofran and nausea resolved.  She was able to tolerate p.o. intake on reassessment.  She checked her blood sugar with home meter and it was in the 200s.  She forgot her insulin today and plans to take it when she gets home. -Nausea could be side effect of IVIG or a result of treatment today being both IVIG and iron infusion. -Patient given prescription for Zofran prn use at home.  I encouraged her to call clinic if nausea persists. -Strict ED precautions discussed should symptoms worsen.      Heme/Onc History: Oncology History   No history exists.      Interval history-: Janice [name] is a 72 y.o. female with oncologic history hypogammaglobinemia associated with CLL/SLL seen in the infusion center today with chief complaint of sudden onset nausea. Patient states she felt fine during her treatment today.  This was the first time she received IVIG and her third iron infusion she tells me.   She has not had any side effects or reactions with previous iron infusions.  She admits to eating lunch while here today.  She has her home meter and checked her blood sugar to make sure it was not low and result was in the low 200s.  She denies any associated abdominal pain.  She denies nausea being persistent problem at home.  She received premedications of Benadryl and Solu-Medrol. She denies fever, chills, urinary symptoms, diarrhea.    ROS  All other systems are reviewed and are negative for acute change except as noted in the HPI.    Allergies  Allergen Reactions   Benzonatate Anaphylaxis, Hives and Other (See Comments)   Fluticasone Furoate-Vilanterol Anaphylaxis and Hives    Other reaction(s): hives  breo ellipta   Iodine Hives, Swelling and Other (See Comments)    And Betadine/caused hives and swelling   Latex Hives, Itching, Rash and Other (See Comments)    Burning   Simvastatin Other (See Comments)    Caused neuropathy in arms Neuropathy in arms   Bupropion Other (See Comments)    Negative thoughts. Crazy thoughts   Levofloxacin Other (See Comments)    Joint aches//leg, shoulder pain Joint pain   Penicillins Hives   Povidone-Iodine Hives   Amoxicillin Hives    Blisters Did it involve swelling of the face/tongue/throat, SOB, or low BP? Yes Did it involve sudden or severe rash/hives, skin peeling, or any reaction on the inside of your mouth or nose? Yes Did you need to seek medical  attention at a hospital or doctor's office?Unknown When did it last happen? 2019      If all above answers are "NO", may proceed with cephalosporin use.    Chlorthalidone Other (See Comments)    Ears ringing    Diflucan [Fluconazole] Hives and Other (See Comments)    blister   Povidone Iodine    Amoxicillin-Pot Clavulanate Rash   Doxycycline Itching and Diarrhea     Past Medical History:  Diagnosis Date   Allergy    Asthma    seasonal   Cellulitis 2013   Left toe   Chronic  kidney disease    stage 1   Cirrhosis (HCC)    Common migraine    History of   Complication of anesthesia    Per pt/had breathing problems with "block" during rotator cuff surgery. 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   Coronary artery disease    DDD (degenerative disc disease)    Depression    denies takes paxil for migraines   Diabetes mellitus    type 2   Diabetic peripheral neuropathy (HCC)    Diverticulitis    Diverticulosis    DJD (degenerative joint disease)    Esophageal varices (HCC)    GERD (gastroesophageal reflux disease)    History of colon polyps    hyperplastic   History of gastric polyp    Hyperlipidemia    Hypertension    Iron deficiency anemia    Obesity    OSA on CPAP    cpap   Peripheral neuropathy    Pneumonia    april 2020  mild   Primary localized osteoarthritis of left knee 03/27/2019   Sensorineural hearing loss    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[place])      Past Surgical History:  Procedure Laterality Date   ANKLE SURGERY     Left    COLONOSCOPY  05/2018   LEEP N/A 09/14/2018   Procedure: LOOP ELECTROSURGICAL EXCISION PROCEDURE (LEEP);  Surgeon: Arvella Nigh, MD;  Location: [facility];  Service: Gynecology;  Laterality: N/A;   MASS EXCISION Right 06/24/2021   Procedure: EXCISION RIGHT POSTERIOR NECK MASS;  Surgeon: Kinsinger, [name], MD;  Location: [facility];  Service: General;  Laterality: Right;   ROTATOR CUFF REPAIR Bilateral 2012, 2015   TONSILLECTOMY     TOTAL KNEE ARTHROPLASTY Left 04/08/2019   Procedure: TOTAL KNEE ARTHROPLASTY;  Surgeon: Elsie Saas, MD;  Location: [facility];  Service: Orthopedics;  Laterality: Left;   UPPER GI ENDOSCOPY  05/2018    Social History   Socioeconomic History   Marital status: Widowed    Spouse name: Not on file   Number of children: 0   Years of education: 3 years college   Highest education level: Not on file  Occupational History   Occupation: Unemployed  Tobacco Use   Smoking  status: Never   Smokeless tobacco: Never  Vaping Use   Vaping Use: Never used  Substance and Sexual Activity   Alcohol use: Not Currently    Comment: very rare   Drug use: No   Sexual activity: Not Currently    Birth control/protection: None, Post-menopausal  Other Topics Concern   Not on file  Social History Narrative   1 caffeine drink daily    Right-handed   widow (husband died from [place] cirrhosis)   Social Determinants of Health   Financial Resource Strain: Medium Risk (08/20/2020)   Overall Financial Resource Strain (CARDIA)    Difficulty of Paying Living  Expenses: Somewhat hard  Food Insecurity: No Food Insecurity (08/20/2020)   Hunger Vital Sign    Worried About Running Out of Food in the Last Year: Never true    Ran Out of Food in the Last Year: Never true  Transportation Needs: No Transportation Needs (08/20/2020)   PRAPARE - Hydrologist (Medical): No    Lack of Transportation (Non-Medical): No  Physical Activity: Not on file  Stress: Not on file  Social Connections: Socially Integrated (08/20/2020)   Social Connection and Isolation Panel [NHANES]    Frequency of Communication with Friends and Family: More than three times a week    Frequency of Social Gatherings with Friends and Family: More than three times a week    Attends Religious Services: More than 4 times per year    Active Member of [employer] or Organizations: Yes    Attends Music therapist: More than 4 times per year    Marital Status: Married  Human resources officer Violence: Not on file    Family History  Problem Relation Age of Onset   Breast cancer Mother    Bone cancer Mother    Heart failure Father    Diabetes Mellitus II Father    Hypertension Father    Diabetes Mellitus II Sister    Obesity Sister    Other Sister        retina problem   Hypertension Brother    Diabetes Mellitus II Brother    Colon cancer Neg Hx    Rectal cancer Neg Hx    Stomach cancer  Neg Hx    Esophageal cancer Neg Hx    Pancreatic cancer Neg Hx    Liver disease Neg Hx      Current Outpatient Medications:    ondansetron (ZOFRAN-ODT) 4 MG disintegrating tablet, Take 1 tablet (4 mg total) by mouth every 8 (eight) hours as needed for nausea or vomiting., Disp: 20 tablet, Rfl: 0   albuterol (VENTOLIN HFA) 108 (90 Base) MCG/ACT inhaler, Inhale 1-2 puffs into the lungs every 6 (six) hours as needed (Cough)., Disp: 18 g, Rfl: 0   amLODipine (NORVASC) 10 MG tablet, Take 10 mg by mouth daily., Disp: , Rfl:    aspirin EC 81 MG tablet, Take 1 tablet (81 mg total) by mouth daily. Swallow whole., Disp: 90 tablet, Rfl: 3   azelastine (ASTELIN) 0.1 % nasal spray, Place 1 spray into both nostrils 2 (two) times daily. Use in each nostril as directed, Disp: 9 mL, Rfl: 11   azelastine (ASTELIN) 0.1 % nasal spray, Place 1 spray into both nostrils 2 (two) times daily. (Patient not taking: Reported on 09/29/2022), Disp: 30 mL, Rfl: 11   b complex vitamins capsule, Take 1 capsule by mouth daily., Disp: , Rfl:    budesonide-formoterol (SYMBICORT) 80-4.5 MCG/ACT inhaler, Take 2 puffs first thing in am and then another 2 puffs about 12 hours later. (Patient not taking: Reported on 09/29/2022), Disp: 1 each, Rfl: 12   budesonide-formoterol (SYMBICORT) 80-4.5 MCG/ACT inhaler, Inhale 2 puffs into the lungs first thing in the morning and then 2 puffs about 12 (twelve) hours later., Disp: 10.2 g, Rfl: 12   CALCIUM PO, Take 1,000 mg by mouth every morning. 500 mg each, Disp: , Rfl:    Carboxymethylcellul-Glycerin (REFRESH OPTIVE PF OP), Place 1 drop into both eyes daily as needed (dry eyes). Non-pres, Disp: , Rfl:    cephALEXin (KEFLEX) 500 MG capsule, Take 2 capsules (1,000 mg total)  by mouth as directed 1 hour prior to dental work., Disp: 20 capsule, Rfl: 0   clindamycin (CLEOCIN) 300 MG capsule, Take 300 mg by mouth every 12 (twelve) hours., Disp: , Rfl:    Coenzyme Q10 (CO Q-10) 100 MG CAPS, Take 100 mg  by mouth daily., Disp: , Rfl:    Continuous Blood Gluc Sensor (FREESTYLE LIBRE 2 SENSOR) MISC, Replace every 14 days and use to check blood sugar continuously topically change every 14 days to monitor blood glucose continuously for 84 days, Disp: , Rfl:    diclofenac sodium (VOLTAREN) 1 % GEL, Apply 2 g topically 2 (two) times daily as needed (knee pain/ foot /Triger finger)., Disp: , Rfl: 2   fluticasone (FLONASE) 50 MCG/ACT nasal spray, Place 2 sprays into both nostrils in the morning. (Patient taking differently: Place 2 sprays into both nostrils 2 (two) times daily.), Disp: 48 g, Rfl: 2   gabapentin (NEURONTIN) 300 MG capsule, Take 1 capsule by mouth twice a day (Patient taking differently: Take 600 mg by mouth at bedtime.), Disp: 180 capsule, Rfl: 3   gabapentin (NEURONTIN) 300 MG capsule, Take 2 capsules (600 mg total) by mouth at bedtime. (Patient not taking: Reported on 09/29/2022), Disp: 180 capsule, Rfl: 3   glucose blood test strip, Use to test blood sugar 3 times daily as directed, Disp: 300 each, Rfl: 3   hydrALAZINE (APRESOLINE) 25 MG tablet, Take 1 tablet (25 mg total) by mouth 2 (two) times daily., Disp: 180 tablet, Rfl: 2   insulin aspart (NOVOLOG FLEXPEN) 100 UNIT/ML FlexPen, Inject 30 Units into the skin 3 (three) times daily with meals., Disp: 15 mL, Rfl: 4   insulin aspart (NOVOLOG) 100 UNIT/ML FlexPen, Inject 30 Units into the skin 3 (three) times daily with meals. (Patient not taking: Reported on 09/29/2022), Disp: 30 mL, Rfl: 2   insulin degludec (TRESIBA) 200 UNIT/ML FlexTouch Pen, Inject 80 Units into the skin daily., Disp: 9 mL, Rfl: 5   insulin lispro (HUMALOG) 100 UNIT/ML KwikPen, Inject 30 Units into the skin 3 (three) times daily with meals. (Patient not taking: Reported on 09/29/2022), Disp: 45 mL, Rfl: 3   Insulin Pen Needle (BD PEN NEEDLE MICRO U/F) 32G X 6 MM MISC, Use as directed 6 times daily, Disp: 400 each, Rfl: 3   Insulin Pen Needle (BD PEN NEEDLE NANO 2ND GEN) 32G  X 4 MM MISC, Use as directed 6 times daily, Disp: 400 each, Rfl: 5   irbesartan (AVAPRO) 300 MG tablet, Take 1 tablet (300 mg total) by mouth in the morning., Disp: 90 tablet, Rfl: 3   Lancets (ONETOUCH DELICA PLUS [id number]) MISC, Use to test blood sugar 3 times daily, Disp: 300 each, Rfl: 2   LORazepam (ATIVAN) 0.5 MG tablet, Take 1-2 tablets (0.5-1 mg total) by mouth once as needed for up to 1 dose (prior to MRI or CT scan for claustrophobia)., Disp: 4 tablet, Rfl: 0   metFORMIN (GLUCOPHAGE-XR) 500 MG 24 hr tablet, Take 2 tablets (1,000 mg total) by mouth 2 (two) times daily., Disp: 360 tablet, Rfl: 3   omeprazole (PRILOSEC) 40 MG capsule, Take 1 capsule by mouth twice daily, Disp: 180 capsule, Rfl: 3   omeprazole (PRILOSEC) 40 MG capsule, Take 1 capsule (40 mg total) by mouth 2 (two) times daily. (Patient not taking: Reported on 09/29/2022), Disp: 180 capsule, Rfl: 7   rosuvastatin (CRESTOR) 10 MG tablet, Take 1 tablet (10 mg total) by mouth daily. (Patient taking differently: Take 10 mg by  mouth at bedtime.), Disp: 90 tablet, Rfl: 2   sertraline (ZOLOFT) 50 MG tablet, Take 1 tablet (50 mg total) by mouth daily. (Patient taking differently: Take 25 mg by mouth every evening.), Disp: 90 tablet, Rfl: 1   spironolactone (ALDACTONE) 50 MG tablet, Take 1 tablet (50 mg total) by mouth daily., Disp: 90 tablet, Rfl: 1   terconazole (TERAZOL 3) 0.8 % vaginal cream, Place 1 applicator vaginally daily as needed (yeast infections)., Disp: , Rfl:   PHYSICAL EXAM: ECOG FS:1 - Symptomatic but completely ambulatory  T: 98.5   BP: 141/45   HR: 76   Resp: 18  SpO2:98% Physical Exam Vitals and nursing note reviewed.  Constitutional:      Appearance: She is well-developed. She is not ill-appearing or toxic-appearing.  HENT:     Head: Normocephalic.     Nose: Nose normal.  Eyes:     Conjunctiva/sclera: Conjunctivae normal.  Neck:     Vascular: No JVD.  Cardiovascular:     Rate and Rhythm: Normal rate and  regular rhythm.     Pulses: Normal pulses.     Heart sounds: Normal heart sounds.  Pulmonary:     Effort: Pulmonary effort is normal.     Breath sounds: Normal breath sounds.  Abdominal:     General: Bowel sounds are normal. There is distension (baseline per parient).     Palpations: There is no mass.     Tenderness: There is no abdominal tenderness. There is no guarding or rebound.     Hernia: A hernia (soft umbilical hernia) is present.  Musculoskeletal:     Cervical back: Normal range of motion.  Skin:    General: Skin is warm and dry.  Neurological:     Mental Status: She is oriented to person, place, and time.        LABORATORY DATA: I have reviewed the data as listed    Latest Ref Rng & Units 10/03/2022    1:53 PM 09/20/2022   12:10 PM 07/20/2022    7:33 PM  CBC  WBC 4.0 - 10.5 K/uL 36.4  39.9  31.0   Hemoglobin 12.0 - 15.0 g/dL 10.3  11.1  9.7   Hematocrit 36.0 - 46.0 % 33.7  34.5  32.5   Platelets 150 - 400 K/uL 103  101  97         Latest Ref Rng & Units 10/03/2022    1:53 PM 09/20/2022   12:10 PM 07/20/2022    7:33 PM  CMP  Glucose 70 - 99 mg/dL 209  97  176   BUN 8 - 23 mg/dL '20  23  13   '$ Creatinine 0.44 - 1.00 mg/dL 0.77  0.82  0.69   Sodium 135 - 145 mmol/L 132  131  135   Potassium 3.5 - 5.1 mmol/L 6.1  5.2  4.5   Chloride 98 - 111 mmol/L 99  100  101   CO2 22 - 32 mmol/L '25  25  24   '$ Calcium 8.9 - 10.3 mg/dL 8.9  9.4  8.6   Total Protein 6.5 - 8.1 g/dL  6.7  6.5   Total Bilirubin 0.3 - 1.2 mg/dL  0.5  0.6   Alkaline Phos 38 - 126 U/L  71  80   AST 15 - 41 U/L  19  21   ALT 0 - 44 U/L  27  23        RADIOGRAPHIC STUDIES (from last 24 hours if applicable)  I have personally reviewed the radiological images as listed and agreed with the findings in the report. No results found.      Visit Diagnosis: 1. Nausea without vomiting   2. Iron deficiency anemia due to chronic blood loss   3. Hypogammaglobulinemia ([place])   4. CLL (chronic lymphocytic  leukemia) ([place])      No orders of the defined types were placed in this encounter.   All questions were answered. The patient knows to call the clinic with any problems, questions or concerns. No barriers to learning was detected.  I have spent a total of 30 minutes minutes of face-to-face and non-face-to-face time, preparing to see the patient, obtaining and/or reviewing separately obtained history, performing a medically appropriate examination, counseling and educating the patient, ordering and prescribing medications, documenting clinical information in the electronic health record.  Thank you for allowing me to participate in the care of this patient.    Barrie Folk, PA-C Department of Hematology/Oncology [facility] Phone: [phone]  [phone]    10/06/2022 10:39 AM

## 2022-10-06 NOTE — Telephone Encounter (Signed)
Before routing to Dr. Melvyn Novas, sending to prior auth team to see which inhalers are covered by pt's insurance that are similar to Symbicort.

## 2022-10-07 ENCOUNTER — Other Ambulatory Visit (HOSPITAL_COMMUNITY): Payer: Self-pay

## 2022-10-07 ENCOUNTER — Other Ambulatory Visit: Payer: Self-pay

## 2022-10-07 MED ORDER — FLUTICASONE-SALMETEROL 250-50 MCG/ACT IN AEPB
1.0000 | INHALATION_SPRAY | Freq: Two times a day (BID) | RESPIRATORY_TRACT | 11 refills | Status: DC
  Filled 2022-10-07: qty 60, 30d supply, fill #0

## 2022-10-07 NOTE — Telephone Encounter (Signed)
Prefer symbicort but if can't afford  it breo 100 daily

## 2022-10-07 NOTE — Telephone Encounter (Signed)
Called and spoke with pt letting her know the info from Dr. Melvyn Novas after our prior auth team did the benefits investigation.  Pt said the Symbicort is too expensive for her and pt is allergic to [facility].  Dr. Melvyn Novas, please advise what you now recommend.

## 2022-10-07 NOTE — Telephone Encounter (Signed)
Called and spoke to patient about Melvyn Novas wanting to send in [place]. She verbalized understanding. Verified pharmacy. Nothing further needed

## 2022-10-07 NOTE — Telephone Encounter (Signed)
Advair 250 one bid

## 2022-10-07 NOTE — Telephone Encounter (Signed)
If would like to stay with ICS+LABA, test claims return the following:   Symbicort: $100 Transition Fill, continued will need prior authorization Dulera: Non-formulary Breo: $47.00 Advair Diskus: $0.00

## 2022-10-07 NOTE — Telephone Encounter (Signed)
Routing to Dr. Melvyn Novas so he can see the message from pt and also see the response from prior auth team about inhalers and then advise what he wants [place] to do.

## 2022-10-08 ENCOUNTER — Other Ambulatory Visit (HOSPITAL_COMMUNITY): Payer: Self-pay

## 2022-10-10 ENCOUNTER — Other Ambulatory Visit: Payer: Self-pay

## 2022-10-10 ENCOUNTER — Other Ambulatory Visit (HOSPITAL_COMMUNITY): Payer: Self-pay

## 2022-10-11 NOTE — Progress Notes (Signed)
Anesthesia Chart Review   Case: [id number] Date/Time: 10/20/22 0715   Procedure: OPEN HERNIA REPAIR UMBILICAL ADULT   Anesthesia type: General   Pre-op diagnosis: UMBILICAL HERNIA   Location: [facility]   Surgeons: Kinsinger, [name], MD       DISCUSSION:72 y.o. never smoker with h/o HTN, DM II, OSA on CPAP, carotid artery stenosis, CKD Stage I, iron deficiency anemia, CLL, liver cirrhosis secondary to NASH, umbilical hernia scheduled for above procedure 10/20/2022 with Dr. Gurney Maxin.   Per GI liver disease stable.  Per GI note 08/30/2022, "Current child classification is A - 6 points-10% perioperative mortality risk  Reviewed with patient regarding possible hernia repair per Dr. Kieth Brightly."  Pt last seen by cardiology 10/18/2022. Stress test and Echo ordered at this visit.   Low risk stress test 11/08/2021  Echo 11/08/2021 with EF 60-65%, mild aortic regurgitation, mild mitral regurgitation, mild pulmonary HTN. Unchanged from 2022 study.   Last seen by pulmonology 08/23/2022.   VS: There were no vitals taken for this visit.  PROVIDERS: Prince Solian, MD is PCP   Dr. Jarrett Ables is Cardiologist  LABS: Labs reviewed: Acceptable for surgery. (all labs ordered are listed, but only abnormal results are displayed)  Labs Reviewed - No data to display   IMAGES: Carotid artery duplex 02/01/2022:  Duplex suggests stenosis in the right internal carotid artery (50-69%).  Duplex suggests stenosis in the right external carotid artery (<50%).  Duplex suggests stenosis in the left internal carotid artery (50-69%).  Duplex suggests stenosis in the left external carotid artery (<50%).  Compared to 02/03/2021, there is progression of stenosis severity in  bilateral carotid arteries. Follow up in six months is appropriate if  clinically indicated.   EKG:   CV: Lexiscan Tetrofosmin stress test 11/08/2021: Lexiscan nuclear stress test performed using 1-day  protocol. Decreased tracer uptake in inferior myocardium, more prominent in rest images. In absence of any associated wall motion and thickening abnormality, this most likely represents breast tissue attenuation, with imaging performed in sitting position. Stress LVEF 72%. Low risk study.  Echocardiogram 11/08/2021: Normal LV systolic function with visual EF 60-65%. Left ventricle cavity is normal in size. No obvious regional wall motion abnormalities. Normal left ventricular wall thickness. Normal global wall motion. Doppler evidence of grade II (pseudonormal) diastolic dysfunction, elevated LAP. Mild (Grade I) aortic regurgitation. Mild (Grade I) mitral regurgitation. Mild tricuspid regurgitation. Mild pulmonary hypertension. RVSP measures 41 mmHg. Compared to study 02/03/2021 no significant change.   Past Medical History:  Diagnosis Date   Allergy    Asthma    seasonal   Cellulitis 2013   Left toe   Chronic kidney disease    stage 1   Cirrhosis (HCC)    Common migraine    History of   Complication of anesthesia    Per pt/had breathing problems with "block" during rotator cuff surgery. 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   Coronary artery disease    DDD (degenerative disc disease)    Depression    denies takes paxil for migraines   Diabetes mellitus    type 2   Diabetic peripheral neuropathy (HCC)    Diverticulitis    Diverticulosis    DJD (degenerative joint disease)    Esophageal varices (HCC)    GERD (gastroesophageal reflux disease)    History of colon polyps    hyperplastic   History of gastric polyp    Hyperlipidemia    Hypertension    Iron  deficiency anemia    Obesity    OSA on CPAP    cpap   Peripheral neuropathy    Pneumonia    april 2020  mild   Primary localized osteoarthritis of left knee 03/27/2019   Sensorineural hearing loss    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[place])     Past Surgical History:  Procedure Laterality Date   ANKLE SURGERY      Left    COLONOSCOPY  05/2018   LEEP N/A 09/14/2018   Procedure: LOOP ELECTROSURGICAL EXCISION PROCEDURE (LEEP);  Surgeon: Arvella Nigh, MD;  Location: [facility];  Service: Gynecology;  Laterality: N/A;   MASS EXCISION Right 06/24/2021   Procedure: EXCISION RIGHT POSTERIOR NECK MASS;  Surgeon: Kinsinger, [name], MD;  Location: [facility];  Service: General;  Laterality: Right;   ROTATOR CUFF REPAIR Bilateral 2012, 2015   TONSILLECTOMY     TOTAL KNEE ARTHROPLASTY Left 04/08/2019   Procedure: TOTAL KNEE ARTHROPLASTY;  Surgeon: Elsie Saas, MD;  Location: [facility];  Service: Orthopedics;  Laterality: Left;   UPPER GI ENDOSCOPY  05/2018    MEDICATIONS: No current facility-administered medications for this encounter.    albuterol (VENTOLIN HFA) 108 (90 Base) MCG/ACT inhaler   amLODipine (NORVASC) 10 MG tablet   aspirin EC 81 MG tablet   azelastine (ASTELIN) 0.1 % nasal spray   b complex vitamins capsule   budesonide-formoterol (SYMBICORT) 80-4.5 MCG/ACT inhaler   CALCIUM PO   Carboxymethylcellul-Glycerin (REFRESH OPTIVE PF OP)   cephALEXin (KEFLEX) 500 MG capsule   clindamycin (CLEOCIN) 300 MG capsule   Coenzyme Q10 (CO Q-10) 100 MG CAPS   diclofenac sodium (VOLTAREN) 1 % GEL   fluticasone (FLONASE) 50 MCG/ACT nasal spray   gabapentin (NEURONTIN) 300 MG capsule   hydrALAZINE (APRESOLINE) 25 MG tablet   insulin aspart (NOVOLOG FLEXPEN) 100 UNIT/ML FlexPen   insulin degludec (TRESIBA) 200 UNIT/ML FlexTouch Pen   irbesartan (AVAPRO) 300 MG tablet   LORazepam (ATIVAN) 0.5 MG tablet   metFORMIN (GLUCOPHAGE-XR) 500 MG 24 hr tablet   omeprazole (PRILOSEC) 40 MG capsule   rosuvastatin (CRESTOR) 10 MG tablet   sertraline (ZOLOFT) 50 MG tablet   spironolactone (ALDACTONE) 50 MG tablet   terconazole (TERAZOL 3) 0.8 % vaginal cream   azelastine (ASTELIN) 0.1 % nasal spray   budesonide-formoterol (SYMBICORT) 80-4.5 MCG/ACT inhaler   Continuous Blood Gluc Sensor (FREESTYLE LIBRE  2 SENSOR) MISC   fluticasone-salmeterol (ADVAIR) 250-50 MCG/ACT AEPB   gabapentin (NEURONTIN) 300 MG capsule   glucose blood test strip   insulin aspart (NOVOLOG) 100 UNIT/ML FlexPen   insulin lispro (HUMALOG) 100 UNIT/ML KwikPen   Insulin Pen Needle (BD PEN NEEDLE MICRO U/F) 32G X 6 MM MISC   Insulin Pen Needle (BD PEN NEEDLE NANO 2ND GEN) 32G X 4 MM MISC   Lancets (ONETOUCH DELICA PLUS [id number]) MISC   omeprazole (PRILOSEC) 40 MG capsule   ondansetron (ZOFRAN-ODT) 4 MG disintegrating tablet    Illona Bulman Ward, PA-C [facility] [phone]

## 2022-10-12 ENCOUNTER — Inpatient Hospital Stay: Payer: PPO

## 2022-10-12 VITALS — BP 121/54 | HR 67 | Temp 97.8°F | Resp 19

## 2022-10-12 DIAGNOSIS — D508 Other iron deficiency anemias: Secondary | ICD-10-CM

## 2022-10-12 DIAGNOSIS — C9110 Chronic lymphocytic leukemia of B-cell type not having achieved remission: Secondary | ICD-10-CM | POA: Diagnosis not present

## 2022-10-12 MED ORDER — LORATADINE 10 MG PO TABS
10.0000 mg | ORAL_TABLET | Freq: Once | ORAL | Status: AC
  Administered 2022-10-12: 10 mg via ORAL
  Filled 2022-10-12: qty 1

## 2022-10-12 MED ORDER — ACETAMINOPHEN 325 MG PO TABS
650.0000 mg | ORAL_TABLET | Freq: Once | ORAL | Status: AC
  Administered 2022-10-12: 650 mg via ORAL
  Filled 2022-10-12: qty 2

## 2022-10-12 MED ORDER — SODIUM CHLORIDE 0.9 % IV SOLN
300.0000 mg | Freq: Once | INTRAVENOUS | Status: AC
  Administered 2022-10-12: 300 mg via INTRAVENOUS
  Filled 2022-10-12: qty 10

## 2022-10-12 MED ORDER — SODIUM CHLORIDE 0.9 % IV SOLN: Freq: Once | INTRAVENOUS | Status: AC

## 2022-10-12 NOTE — Progress Notes (Signed)
Pt declined to stay for full 30 min post obs. Observed for 15 min post venofer, discharged with VSS, ambulatory to lobby

## 2022-10-12 NOTE — Patient Instructions (Signed)
Iron Sucrose Injection What is this medication? IRON SUCROSE (EYE ern SOO krose) treats low levels of iron (iron deficiency anemia) in people with kidney disease. Iron is a mineral that plays an important role in making red blood cells, which carry oxygen from your lungs to the rest of your body. This medicine may be used for other purposes; ask your health care provider or pharmacist if you have questions. COMMON BRAND NAME(S): Venofer What should I tell my care team before I take this medication? They need to know if you have any of these conditions: Anemia not caused by low iron levels Heart disease High levels of iron in the blood Kidney disease Liver disease An unusual or allergic reaction to iron, other medications, foods, dyes, or preservatives Pregnant or trying to get pregnant Breastfeeding How should I use this medication? This medication is for infusion into a vein. It is given in a hospital or clinic setting. Talk to your care team about the use of this medication in children. While this medication may be prescribed for children as young as 2 years for selected conditions, precautions do apply. Overdosage: If you think you have taken too much of this medicine contact a poison control center or emergency room at once. NOTE: This medicine is only for you. Do not share this medicine with others. What if I miss a dose? Keep appointments for follow-up doses. It is important not to miss your dose. Call your care team if you are unable to keep an appointment. What may interact with this medication? Do not take this medication with any of the following: Deferoxamine Dimercaprol Other iron products This medication may also interact with the following: Chloramphenicol Deferasirox This list may not describe all possible interactions. Give your health care provider a list of all the medicines, herbs, non-prescription drugs, or dietary supplements you use. Also tell them if you smoke,  drink alcohol, or use illegal drugs. Some items may interact with your medicine. What should I watch for while using this medication? Visit your care team regularly. Tell your care team if your symptoms do not start to get better or if they get worse. You may need blood work done while you are taking this medication. You may need to follow a special diet. Talk to your care team. Foods that contain iron include: whole grains/cereals, dried fruits, beans, or peas, leafy green vegetables, and organ meats (liver, kidney). What side effects may I notice from receiving this medication? Side effects that you should report to your care team as soon as possible: Allergic reactions--skin rash, itching, hives, swelling of the face, lips, tongue, or throat Low blood pressure--dizziness, feeling faint or lightheaded, blurry vision Shortness of breath Side effects that usually do not require medical attention (report to your care team if they continue or are bothersome): Flushing Headache Joint pain Muscle pain Nausea Pain, redness, or irritation at injection site This list may not describe all possible side effects. Call your doctor for medical advice about side effects. You may report side effects to FDA at 1-800-FDA-1088. Where should I keep my medication? This medication is given in a hospital or clinic and will not be stored at home. NOTE: This sheet is a summary. It may not cover all possible information. If you have questions about this medicine, talk to your doctor, pharmacist, or health care provider.  2023 Elsevier/Gold Standard (2020-12-10 00:00:00)

## 2022-10-13 ENCOUNTER — Telehealth: Payer: Self-pay | Admitting: Internal Medicine

## 2022-10-13 NOTE — Telephone Encounter (Signed)
Patient would like to talk with the nurse regarding her inhaler.  She stated that ones that were sent in have powder in it which she is allergic to.  Please call patient to discuss further.  CB# [phone]

## 2022-10-13 NOTE — Telephone Encounter (Signed)
Called patient and she states the Advair we sent in she is allergic to. Pharmacy told her that it has one ingredient as Breo and she can not take it she is allergic to it.   Please advise sir

## 2022-10-13 NOTE — Telephone Encounter (Signed)
Can we run a ticket to see what this patients insurance will cover. She is allergic to a lot of medications. And she states that she is unsure what is covered.   Thank you

## 2022-10-13 NOTE — Telephone Encounter (Signed)
Cannot solve this problem over the phone > needs ov next available and in meantime just use albuterol up to every 4 h prn > needs to bring in drug fomulary with her and all inhalers and neb solutions if applicable

## 2022-10-14 ENCOUNTER — Ambulatory Visit
Admission: RE | Admit: 2022-10-14 | Discharge: 2022-10-14 | Disposition: A | Discharge status: Home / Self Care | Payer: PPO | Source: Ambulatory Visit | Attending: Obstetrics and Gynecology | Admitting: Obstetrics and Gynecology

## 2022-10-14 DIAGNOSIS — Z1231 Encounter for screening mammogram for malignant neoplasm of breast: Secondary | ICD-10-CM | POA: Diagnosis not present

## 2022-10-14 NOTE — Telephone Encounter (Signed)
Patient called and set up follow up appointment with him. Will bring in all inhalers with her to visit. Nothing further needed

## 2022-10-15 NOTE — Progress Notes (Unsigned)
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 rec dysmista / zyrtec referred to pulmonary clinic 04/29/2021 by Dr   Dagmar Hait for cough/wheeze with prn use of albuterol x years but rarely used it until around March 12 2021    History of Present Illness  04/29/2021  Pulmonary/ 1st office eval/Janice [name]  Chief Complaint  Patient presents with   Consult    Cough and wheezing for 3 weeks  Onset July 27th 2022 lots of nasal congestion/ drainage turned slt yellow while maint on flonase and clariton rx zpak > worse with cough/ wheeze rx prednisone > nebulizer  in office helped some > but still cough to point of gagging despite breztri 04/22/21 Dyspnea:  not limiting but not longer working out on ex bike Cough: minimal  Sleep: cpap / at least once a night wakes up cough  SABA use: last used 5 h  prior Finishing omnicef / no longer purulent secretions at this point  Rec Stop breztri Take omeprazole 40 mg Take 30- 60 min before your first and last meals of the day  add gabapentin 300 mg each am until the cough is gone for at least a week  GERD diet reviewed, bed blocks rec  Only use your albuterol as a rescue medication Take delsym two tsp every 12 hours and supplement if needed with  Tylenol #3   up to 1-2 every 4 hours to suppress the urge to cough. Swallowing water and/or using ice chips/non mint and menthol containing candies (such as lifesavers or sugarless jolly ranchers) are also effective.  You should rest your voice and avoid activities that you know make you cough. Once you have eliminated the cough for 3 straight days try reducing the Tylenol #3 first,  then the delsym as tolerated.    Stop clariton  Dymista (takes the place of flonase)  twice daily should eliminate the drippy nose (if too expensive ok  For drainage / throat tickle try take CHLORPHENIRAMINE  4 mg  (Chlortab '4mg'$   at [employer] should be easiest to find in the green box)  take one every 4 hours as needed - available over the counter- may cause drowsiness so start with just a dose or two an hour before bedtime and see how you tolerate it before trying in daytime )  Allergy profile 04/29/2021 >  Eos 0.1 /  IgE  6 Depomedrol 120  mg IM today Please schedule a follow up office visit in 4 weeks, sooner if needed    05/27/2021  f/u ov/Janice [name] re: cough x 2015 c/w uacs maint on prilosec 40 mg bid  and gabapentin 300 mg with supper - could not tol 300 mg in am  Dyspnea:  one block takes about 23 min and stops a couple of times due to sob but overall improving with regular walking  Cough: still some throat clearing  Sleeping: cpap/ risers and sleeping fine  SABA use: none  02: none  Covid status:   vax x 3   Rec Flu shot today  No change in medications except add gabapentin 100 mg each am - ok to adjust up to a  maximum of 300 mg three times daily  Please schedule a follow up visit in 3 months but call sooner if needed or let me refer you to DR Joya Gaskins at [facility] for irritable larynx syndrome> did not seek referral   07/20/22 cough so bad could not catch her breath so went to er with nl exam, nl CT neck  08/23/2022  f/u ov/Janice [name] re: UACS  maint on astelin and flonase and  300 mg x 2 hs  Chief Complaint  Patient presents with   Follow-up    Pt states she has been very anemic since LOV.    Dyspnea:  no longer walking around block Cough: dry hack to point of choking "but it's better than it was"   Sleeping: on risers  SABA use: seems to help though took it w/in 2 h ov ov and still having coughing fits in office 02: none  Rec Try generic symbicort 80 (or dulera 100)   Take 2 puffs first thing in am and then  another 2 puffs about 12 hours later.  Work on inhaler technique:   Only use your albuterol as a rescue medication  My office will be contacting you by phone for referral to voice center at [facility] > March 2024    Depomedrol 120 mg IM    10/17/2022  f/u ov/Janice [name] re: UACS maint off symbicort x 2 weeks and having more cough since ran out  Chief Complaint  Patient presents with   Follow-up    Dx anemia.  Has dyspnea. IV iron tx.    Dyspnea:  more doe attributes to anemia but not clear whether corrlates with symbicort and last hgb 10  Cough: onset 10/15/22 daytime >  noct / non-productive / urge to clear throat. Sleeping: on risers cough has recurred noct as well  SABA use: none  02: none  Covid status:   4 total vax     No obvious day to day or daytime variability or assoc excess/ purulent sputum or mucus plugs or hemoptysis or cp or chest tightness, subjective wheeze or overt   hb symptoms.   Also denies any obvious fluctuation of symptoms with weather or environmental changes or other aggravating or alleviating factors except as outlined above   No unusual exposure hx or h/o childhood asthma or knowledge of premature birth.  Current Allergies, Complete Past Medical History, Past Surgical History, Family History, and Social History were reviewed in [employer] record.  ROS  The following are not active complaints unless bolded Hoarseness, sore throat, dysphagia, dental problems, itching, sneezing,  nasal congestion or discharge of excess mucus or purulent secretions, ear ache,   fever, chills, sweats, unintended wt loss or wt gain, classically pleuritic or exertional cp,  orthopnea pnd or arm/hand swelling  or leg swelling, presyncope, palpitations, abdominal pain, anorexia, nausea, vomiting, diarrhea  or change in bowel habits or change in bladder habits, change in stools or change in urine, dysuria, hematuria,  rash, arthralgias, visual complaints, headache, numbness,  weakness or ataxia or problems with walking or coordination,  change in mood or  memory.        Current Meds - - NOTE:   Unable to verify as accurately reflecting what pt takes    Medication Sig   albuterol (VENTOLIN HFA) 108 (90 Base) MCG/ACT inhaler Inhale 1-2 puffs into the lungs every 6 (six) hours as needed (Cough).   amLODipine (NORVASC) 10 MG tablet Take 10 mg by mouth daily.   aspirin  EC 81 MG tablet Take 1 tablet (81 mg total) by mouth daily. Swallow whole.   azelastine (ASTELIN) 0.1 % nasal spray Place 1 spray into both nostrils 2 (two) times daily. Use in each nostril as directed   b complex vitamins capsule Take 1 capsule by mouth daily.   CALCIUM PO Take 1,000 mg by mouth every morning. 500 mg each   Carboxymethylcellul-Glycerin (REFRESH OPTIVE PF OP) Place 1 drop into both eyes daily as needed (dry eyes). Non-pres   cephALEXin (KEFLEX) 500 MG capsule Take 2 capsules (1,000 mg total) by mouth as directed 1 hour prior to dental work.   Coenzyme Q10 (CO Q-10) 100 MG CAPS Take 100 mg by mouth daily.   Continuous Blood Gluc Sensor (FREESTYLE LIBRE 2 SENSOR) MISC Replace every 14 days and use to check blood sugar continuously topically change every 14 days to monitor blood glucose continuously for 84 days   diclofenac sodium (VOLTAREN) 1 % GEL Apply 2 g topically 2 (two) times daily as needed (knee pain/ foot /Triger finger).   fluticasone (FLONASE) 50 MCG/ACT nasal spray Place 2 sprays into both nostrils in the morning. (Patient taking differently: Place 2 sprays into both nostrils 2 (two) times daily.)   gabapentin (NEURONTIN) 300 MG capsule Take 1 capsule by mouth twice a day (Patient taking differently: Take 600 mg by mouth at bedtime.)   glucose blood test strip Use to test blood sugar 3 times daily as directed   hydrALAZINE (APRESOLINE) 25 MG tablet Take 1 tablet (25 mg total) by mouth 2 (two) times daily.   insulin aspart (NOVOLOG FLEXPEN) 100 UNIT/ML FlexPen Inject 30 Units into  the skin 3 (three) times daily with meals.   insulin degludec (TRESIBA) 200 UNIT/ML FlexTouch Pen Inject 80 Units into the skin daily.   Insulin Pen Needle (BD PEN NEEDLE MICRO U/F) 32G X 6 MM MISC Use as directed 6 times daily   Insulin Pen Needle (BD PEN NEEDLE NANO 2ND GEN) 32G X 4 MM MISC Use as directed 6 times daily   irbesartan (AVAPRO) 300 MG tablet Take 1 tablet (300 mg total) by mouth in the morning.   Lancets (ONETOUCH DELICA PLUS [id number]) MISC Use to test blood sugar 3 times daily   LORazepam (ATIVAN) 0.5 MG tablet Take 1-2 tablets (0.5-1 mg total) by mouth once as needed for up to 1 dose (prior to MRI or CT scan for claustrophobia).   metFORMIN (GLUCOPHAGE-XR) 500 MG 24 hr tablet Take 2 tablets (1,000 mg total) by mouth 2 (two) times daily.   omeprazole (PRILOSEC) 40 MG capsule Take 1 capsule by mouth twice daily   ondansetron (ZOFRAN-ODT) 4 MG disintegrating tablet Take 1 tablet (4 mg total) by mouth every 8 (eight) hours as needed for nausea or vomiting.   rosuvastatin (CRESTOR) 10 MG tablet Take 1 tablet (10 mg total) by mouth daily. (Patient taking differently: Take 10 mg by mouth at bedtime.)   sertraline (ZOLOFT) 50 MG tablet Take 1 tablet (50 mg total) by mouth daily. (Patient taking differently: Take 25 mg by mouth every evening.)   spironolactone (ALDACTONE) 50 MG tablet Take 1 tablet (50 mg total) by mouth daily.   terconazole (TERAZOL 3) 0.8 % vaginal cream Place 1 applicator vaginally daily as needed (yeast infections).                      Past Medical History:  Diagnosis Date   Allergy    Asthma  seasonal   Cellulitis 2013   Left toe   Cirrhosis ([place])    Common migraine    History of   Complication of anesthesia    Per pt/had breathing problems with "block" during rotator cuff surgery. 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   DDD (degenerative disc disease)    Depression    denies takes paxil for migraines   Diabetes mellitus    type 2   Diabetic  peripheral neuropathy (HCC)    Diverticulitis    Diverticulosis    DJD (degenerative joint disease)    GERD (gastroesophageal reflux disease)    History of colon polyps    hyperplastic   History of gastric polyp    Hyperlipidemia    Hypertension    Iron deficiency anemia    Obesity    OSA on CPAP    cpap   Pneumonia    april 2020  mild   Primary localized osteoarthritis of left knee 03/27/2019   Sensorineural hearing loss        Objective:    wts  10/17/2022         226  08/23/2022     224    05/27/21 221 lb 6.4 oz (100.4 kg)  05/18/21 225 lb (102.1 kg)  05/06/21 222 lb (100.7 kg)    Vital signs reviewed  10/17/2022  - Note at rest 02 sats  97% on RA   General appearance:    amb  mod obese amb wf nad/ freq thoat clearing     HEENT : Oropharynx  clear      NECK :  without  apparent JVD/ palpable Nodes/TM    LUNGS: no acc muscle use,  Nl contour chest which is clear to A and P bilaterally without cough on insp or exp maneuvers   CV:  RRR  no s3 or murmur or increase in P2, and no edema   ABD:  soft and nontender with nl inspiratory excursion in the supine position. No bruits or organomegaly appreciated   MS:  Nl gait/ ext warm without deformities Or obvious joint restrictions  calf tenderness, cyanosis or clubbing    SKIN: warm and dry without lesions    NEURO:  alert, approp, nl sensorium with  no motor or cerebellar deficits apparent.         I personally reviewed images and agree with radiology impression as follows:    CT neck s contrast    07/21/22   1. Mild enlargement of the palatine tonsils, as can be seen with tonsillitis, without low-density collection to suggest abscess or phlegmon. 2. Numerous enlarged cervical, mediastinal, and subpectoral lymph nodes, which appear similar to the 06/22/2022 PET-CT and consistent with the patient's diagnosis of CLL.         Assessment

## 2022-10-17 ENCOUNTER — Ambulatory Visit: Payer: PPO | Admitting: Internal Medicine

## 2022-10-17 ENCOUNTER — Telehealth: Payer: Self-pay | Admitting: Internal Medicine

## 2022-10-17 ENCOUNTER — Encounter: Payer: Self-pay | Admitting: Hematology

## 2022-10-17 ENCOUNTER — Encounter: Payer: Self-pay | Admitting: Internal Medicine

## 2022-10-17 ENCOUNTER — Other Ambulatory Visit (HOSPITAL_COMMUNITY): Payer: Self-pay

## 2022-10-17 VITALS — BP 134/60 | HR 70 | Temp 98.0°F | Ht 64.5 in | Wt 226.2 lb

## 2022-10-17 DIAGNOSIS — R058 Other specified cough: Secondary | ICD-10-CM

## 2022-10-17 DIAGNOSIS — J45991 Cough variant asthma: Secondary | ICD-10-CM | POA: Diagnosis not present

## 2022-10-17 LAB — POCT EXHALED NITRIC OXIDE: FeNO level (ppb): 18

## 2022-10-17 MED ORDER — MOMETASONE FURO-FORMOTEROL FUM 100-5 MCG/ACT IN AERO
INHALATION_SPRAY | RESPIRATORY_TRACT | 11 refills | Status: DC
  Filled 2022-10-17: qty 13, 30d supply, fill #0
  Filled 2022-10-17: qty 1, fill #0

## 2022-10-17 NOTE — Telephone Encounter (Signed)
Mychart message sent by pt: Janice [name] "[facility] (supporting Tanda Rockers, MD)5 minutes ago (3:09 PM)    You sent to pharmacy Rx for inhaler [place] had to send you a request for prior authorization to [employer], please respond, thank you, S. Jake Seats     Routing to prior [place] team for review.

## 2022-10-17 NOTE — Assessment & Plan Note (Addendum)
?   Onset in pt with h/o sinus problems and freq cough  Dating back at least to 2015 @ initial [facility] eval - Allergy profile 04/29/2021 > Eos 0.1 /  IgE  6 - 08/23/2022  After extensive coaching inhaler device,  effectiveness =    75% so try symb 80 2bid and depomedrol 120 mg IM  -  10/17/2022  After extensive coaching inhaler device,  effectiveness =    80% try dulera 100 2bid  - 10/17/2022  FENO =   She's convinced symbicort 80 worked "90%" on her cough but note also rx for GERD, rhinitis and given depomedrol 120 mg all at same time so this time rec just restart laba/ics = dulera 100 2bid and not change in other components of rx to see what if any benefit this has.   Will keep f/u appt  11/29/22 with ENT eval in meantime       Each maintenance medication was reviewed in detail including emphasizing most importantly the difference between maintenance and prns and under what circumstances the prns are to be triggered using an action plan format where appropriate.  Total time for H and P, chart review, counseling, reviewing hfa  device(s) and generating customized AVS unique to this office visit / same day charting > 30 min for multiple  refractory respiratory  symptoms of uncertain etiology

## 2022-10-17 NOTE — Patient Instructions (Addendum)
Dulera 100 Take 2 puffs first thing in am and then another 2 puffs about 12 hours later. Blow out thru [employer] if insurance not covering   No other change in meds   For itching/sneezing runny nose Zyrtec 10 mg one daily as needed   Keep your follow up appt :  with all medications /inhalers/ solutions in hand so we can verify exactly what you are taking. This includes all medications from all doctors and over the [place] separate them into two bags:  the ones you take automatically, no matter what, vs the ones you take just when you feel you need them "BAG #2 is UP TO YOU"  - this will really help [place] help you take your medications more effectively.

## 2022-10-18 ENCOUNTER — Telehealth: Payer: Self-pay

## 2022-10-18 ENCOUNTER — Other Ambulatory Visit (HOSPITAL_COMMUNITY): Payer: Self-pay

## 2022-10-18 MED ORDER — INSULIN LISPRO (1 UNIT DIAL) 100 UNIT/ML (KWIKPEN)
30.0000 [IU] | PEN_INJECTOR | Freq: Three times a day (TID) | SUBCUTANEOUS | 11 refills | Status: DC
  Filled 2022-10-18: qty 30, 33d supply, fill #0

## 2022-10-18 NOTE — Telephone Encounter (Signed)
Patient Advocate Encounter   Received notification from [employer] [place] [employer] that prior authorization is required for [facility] 100-5MCG/ACT aerosol  Submitted: 10-18-2022 Key [id number]   Status is pending

## 2022-10-18 NOTE — Telephone Encounter (Signed)
A PA has been submitted for [facility] and will be in another encounter. If appropriate, inhalers in the same class (ICS+LABA) returned the following results:  Symbicort  Both generic and brand   $100 Breo   $47 Advair Diskus   generic  $0.00  Please advise if changing or wish to continue PA

## 2022-10-19 ENCOUNTER — Other Ambulatory Visit: Payer: Self-pay

## 2022-10-19 ENCOUNTER — Other Ambulatory Visit (HOSPITAL_COMMUNITY): Payer: Self-pay

## 2022-10-19 DIAGNOSIS — G4733 Obstructive sleep apnea (adult) (pediatric): Secondary | ICD-10-CM | POA: Diagnosis not present

## 2022-10-19 MED ORDER — NOVOLOG FLEXPEN 100 UNIT/ML ~~LOC~~ SOPN
30.0000 [IU] | PEN_INJECTOR | Freq: Three times a day (TID) | SUBCUTANEOUS | 11 refills | Status: DC
  Filled 2022-10-19: qty 30, 32d supply, fill #0
  Filled 2022-12-07: qty 30, 32d supply, fill #1
  Filled 2023-01-30: qty 30, 32d supply, fill #2

## 2022-10-19 NOTE — Telephone Encounter (Signed)
Try pa for generic symbiocrt as she has refractory cough and DPI's typically make this worse

## 2022-10-19 NOTE — Telephone Encounter (Signed)
Please try a PA for generic symbicort; pt has a refractory cough and DPI's typically make it worse per Dr.Wert

## 2022-10-19 NOTE — Anesthesia Preprocedure Evaluation (Addendum)
Anesthesia Evaluation  Patient identified by MRN, date of birth, ID band Patient awake    Reviewed: Allergy & Precautions, NPO status , Patient's Chart, lab work & pertinent test results  History of Anesthesia Complications Negative for: history of anesthetic complications  Airway Mallampati: III  TM Distance: >3 FB Neck ROM: Full    Dental  (+) Dental Advisory Given   Pulmonary asthma , sleep apnea    Pulmonary exam normal        Cardiovascular hypertension, Pt. on medications + CAD  Normal cardiovascular exam     Neuro/Psych  Headaches PSYCHIATRIC DISORDERS  Depression     Hearing loss   Neuromuscular disease    GI/Hepatic Neg liver ROS,GERD  Medicated and Controlled,,  Endo/Other  diabetes, Type 2, Oral Hypoglycemic Agents, Insulin Dependent   Obesity   Renal/GU negative Renal ROS     Musculoskeletal  (+) Arthritis ,    Abdominal   Peds  Hematology negative hematology ROS (+)   Anesthesia Other Findings   Reproductive/Obstetrics                             Anesthesia Physical Anesthesia Plan  ASA: 3  Anesthesia Plan: General   Post-op Pain Management:    Induction: Intravenous  PONV Risk Score and Plan: 3 and Treatment may vary due to age or medical condition, Ondansetron and Dexamethasone  Airway Management Planned: Oral ETT  Additional Equipment: None  Intra-op Plan:   Post-operative Plan: Extubation in OR  Informed Consent: I have reviewed the patients History and Physical, chart, labs and discussed the procedure including the risks, benefits and alternatives for the proposed anesthesia with the patient or authorized representative who has indicated his/her understanding and acceptance.     Dental advisory given  Plan Discussed with: CRNA and Anesthesiologist  Anesthesia Plan Comments:        Anesthesia Quick Evaluation

## 2022-10-19 NOTE — Telephone Encounter (Signed)
Test claims return the following:  [facility] Trelegy  $47 Breztri  $47  ICS+ LABA Advair Diskus PA Breo  $47 Dulera  PA pending Symbicort  $100  LAMA  Incruse  $47 Spiriva Handihaler  $47 Spiriva Respimat $47 Tudorza  PA  LABA+LAMA Anoro  $47 Stiolto  $47 Bevespi  PA  LABA Striverdi  PA  ICS [place]ves[place]  PA Arnuity  $47 Asmanex  $100 Flovent HFA generic  PA Flovent Diskus generic  PA Pulmicort Flexhaler  PA Qvar Redihaler  PA

## 2022-10-19 NOTE — Telephone Encounter (Signed)
Dr. Melvyn Novas please advise on the message from prior auth team if you want them to continue doing the PA for [facility] or if you want to switch to one of the other inhalers.

## 2022-10-20 ENCOUNTER — Encounter (HOSPITAL_COMMUNITY)
Admission: RE | Disposition: A | Discharge status: Home / Self Care | Payer: Self-pay | Source: Home / Self Care | Attending: General Surgery

## 2022-10-20 ENCOUNTER — Ambulatory Visit (HOSPITAL_BASED_OUTPATIENT_CLINIC_OR_DEPARTMENT_OTHER): Payer: PPO | Admitting: Physician Assistant

## 2022-10-20 ENCOUNTER — Observation Stay (HOSPITAL_COMMUNITY)
Admission: RE | Admit: 2022-10-20 | Discharge: 2022-10-21 | Disposition: A | Discharge status: Home / Self Care | Payer: PPO | Attending: General Surgery | Admitting: General Surgery

## 2022-10-20 ENCOUNTER — Other Ambulatory Visit: Payer: Self-pay

## 2022-10-20 ENCOUNTER — Encounter (HOSPITAL_COMMUNITY): Payer: Self-pay | Admitting: General Surgery

## 2022-10-20 ENCOUNTER — Other Ambulatory Visit (HOSPITAL_COMMUNITY): Payer: Self-pay

## 2022-10-20 ENCOUNTER — Ambulatory Visit (HOSPITAL_COMMUNITY): Payer: PPO | Admitting: Physician Assistant

## 2022-10-20 DIAGNOSIS — Z7982 Long term (current) use of aspirin: Secondary | ICD-10-CM | POA: Insufficient documentation

## 2022-10-20 DIAGNOSIS — I2510 Atherosclerotic heart disease of native coronary artery without angina pectoris: Secondary | ICD-10-CM | POA: Diagnosis not present

## 2022-10-20 DIAGNOSIS — E119 Type 2 diabetes mellitus without complications: Secondary | ICD-10-CM | POA: Insufficient documentation

## 2022-10-20 DIAGNOSIS — E1149 Type 2 diabetes mellitus with other diabetic neurological complication: Secondary | ICD-10-CM

## 2022-10-20 DIAGNOSIS — Z794 Long term (current) use of insulin: Secondary | ICD-10-CM | POA: Diagnosis not present

## 2022-10-20 DIAGNOSIS — G4733 Obstructive sleep apnea (adult) (pediatric): Secondary | ICD-10-CM

## 2022-10-20 DIAGNOSIS — I10 Essential (primary) hypertension: Secondary | ICD-10-CM | POA: Diagnosis not present

## 2022-10-20 DIAGNOSIS — Z79899 Other long term (current) drug therapy: Secondary | ICD-10-CM | POA: Insufficient documentation

## 2022-10-20 DIAGNOSIS — Z7952 Long term (current) use of systemic steroids: Secondary | ICD-10-CM | POA: Insufficient documentation

## 2022-10-20 DIAGNOSIS — K429 Umbilical hernia without obstruction or gangrene: Principal | ICD-10-CM | POA: Diagnosis present

## 2022-10-20 DIAGNOSIS — Z7984 Long term (current) use of oral hypoglycemic drugs: Secondary | ICD-10-CM | POA: Diagnosis not present

## 2022-10-20 DIAGNOSIS — Z96652 Presence of left artificial knee joint: Secondary | ICD-10-CM | POA: Insufficient documentation

## 2022-10-20 DIAGNOSIS — T884XXA Failed or difficult intubation, initial encounter: Secondary | ICD-10-CM

## 2022-10-20 HISTORY — PX: UMBILICAL HERNIA REPAIR: SHX196

## 2022-10-20 HISTORY — DX: Failed or difficult intubation, initial encounter: T88.4XXA

## 2022-10-20 LAB — CBC
HCT: 34.9 % — ABNORMAL LOW (ref 36.0–46.0)
Hemoglobin: 10.5 g/dL — ABNORMAL LOW (ref 12.0–15.0)
MCH: 27.9 pg (ref 26.0–34.0)
MCHC: 30.1 g/dL (ref 30.0–36.0)
MCV: 92.8 fL (ref 80.0–100.0)
Platelets: 108 10*3/uL — ABNORMAL LOW (ref 150–400)
RBC: 3.76 MIL/uL — ABNORMAL LOW (ref 3.87–5.11)
RDW: 17.5 % — ABNORMAL HIGH (ref 11.5–15.5)
WBC: 56.9 10*3/uL (ref 4.0–10.5)
nRBC: 0.1 % (ref 0.0–0.2)

## 2022-10-20 LAB — GLUCOSE, CAPILLARY
Glucose-Capillary: 198 mg/dL — ABNORMAL HIGH (ref 70–99)
Glucose-Capillary: 177 mg/dL — ABNORMAL HIGH (ref 70–99)
Glucose-Capillary: 209 mg/dL — ABNORMAL HIGH (ref 70–99)
Glucose-Capillary: 296 mg/dL — ABNORMAL HIGH (ref 70–99)
Glucose-Capillary: 321 mg/dL — ABNORMAL HIGH (ref 70–99)

## 2022-10-20 LAB — CREATININE, SERUM
Creatinine, Ser: 0.81 mg/dL (ref 0.44–1.00)
GFR, Estimated: >60 mL/min (ref >60)

## 2022-10-20 SURGERY — REPAIR, HERNIA, UMBILICAL, ADULT
Anesthesia: General

## 2022-10-20 MED ORDER — ONDANSETRON HCL 4 MG/2ML IJ SOLN
INTRAMUSCULAR | Status: AC
  Filled 2022-10-20: qty 2

## 2022-10-20 MED ORDER — PANTOPRAZOLE SODIUM 40 MG PO TBEC
40.0000 mg | DELAYED_RELEASE_TABLET | Freq: Every day | ORAL | Status: DC
  Administered 2022-10-21: 40 mg via ORAL
  Filled 2022-10-20: qty 1

## 2022-10-20 MED ORDER — AZELASTINE HCL 0.1 % NA SOLN
1.0000 | Freq: Two times a day (BID) | NASAL | Status: DC
  Administered 2022-10-20 – 2022-10-21 (×2): 1 via NASAL
  Filled 2022-10-20: qty 30

## 2022-10-20 MED ORDER — ONDANSETRON 4 MG PO TBDP
4.0000 mg | ORAL_TABLET | Freq: Four times a day (QID) | ORAL | Status: DC | PRN
  Administered 2022-10-20: 4 mg via ORAL
  Filled 2022-10-20: qty 1

## 2022-10-20 MED ORDER — LIDOCAINE HCL (PF) 2 % IJ SOLN
INTRAMUSCULAR | Status: AC
  Filled 2022-10-20: qty 5

## 2022-10-20 MED ORDER — BUPIVACAINE LIPOSOME 1.3 % IJ SUSP: 20.0000 mL | Freq: Once | INTRAMUSCULAR | Status: DC

## 2022-10-20 MED ORDER — LIDOCAINE HCL (CARDIAC) PF 100 MG/5ML IV SOSY
PREFILLED_SYRINGE | INTRAVENOUS | Status: DC | PRN
  Administered 2022-10-20: 60 mg via INTRAVENOUS

## 2022-10-20 MED ORDER — CALCIUM CARBONATE ANTACID 500 MG PO CHEW
1000.0000 mg | CHEWABLE_TABLET | Freq: Every morning | ORAL | Status: DC
  Administered 2022-10-21: 1000 mg via ORAL
  Filled 2022-10-20: qty 5

## 2022-10-20 MED ORDER — BUPIVACAINE LIPOSOME 1.3 % IJ SUSP
INTRAMUSCULAR | Status: AC
  Filled 2022-10-20: qty 20

## 2022-10-20 MED ORDER — TRAMADOL HCL 50 MG PO TABS
50.0000 mg | ORAL_TABLET | Freq: Four times a day (QID) | ORAL | Status: DC | PRN
  Administered 2022-10-20: 50 mg via ORAL
  Filled 2022-10-20: qty 1

## 2022-10-20 MED ORDER — OXYCODONE HCL 5 MG PO TABS
5.0000 mg | ORAL_TABLET | ORAL | Status: DC | PRN
  Administered 2022-10-20 – 2022-10-21 (×3): 5 mg via ORAL
  Filled 2022-10-20 (×3): qty 1

## 2022-10-20 MED ORDER — FENTANYL CITRATE (PF) 100 MCG/2ML IJ SOLN
INTRAMUSCULAR | Status: DC | PRN
  Administered 2022-10-20 (×2): 50 ug via INTRAVENOUS

## 2022-10-20 MED ORDER — AMLODIPINE BESYLATE 10 MG PO TABS
10.0000 mg | ORAL_TABLET | Freq: Every day | ORAL | Status: DC
  Administered 2022-10-21: 10 mg via ORAL
  Filled 2022-10-20 (×2): qty 1

## 2022-10-20 MED ORDER — GABAPENTIN 400 MG PO CAPS
600.0000 mg | ORAL_CAPSULE | Freq: Every day | ORAL | Status: DC
  Administered 2022-10-20: 600 mg via ORAL
  Filled 2022-10-20: qty 2

## 2022-10-20 MED ORDER — HYDRALAZINE HCL 25 MG PO TABS
25.0000 mg | ORAL_TABLET | Freq: Two times a day (BID) | ORAL | Status: DC
  Administered 2022-10-20 – 2022-10-21 (×2): 25 mg via ORAL
  Filled 2022-10-20 (×2): qty 1

## 2022-10-20 MED ORDER — FLUTICASONE PROPIONATE 50 MCG/ACT NA SUSP
2.0000 | Freq: Two times a day (BID) | NASAL | Status: DC
  Administered 2022-10-20 – 2022-10-21 (×2): 2 via NASAL
  Filled 2022-10-20: qty 16

## 2022-10-20 MED ORDER — PROPOFOL 10 MG/ML IV BOLUS
INTRAVENOUS | Status: DC | PRN
  Administered 2022-10-20: 130 mg via INTRAVENOUS

## 2022-10-20 MED ORDER — IRBESARTAN 150 MG PO TABS
300.0000 mg | ORAL_TABLET | Freq: Every day | ORAL | Status: DC
  Administered 2022-10-21: 300 mg via ORAL
  Filled 2022-10-20: qty 2

## 2022-10-20 MED ORDER — ENOXAPARIN SODIUM 40 MG/0.4ML IJ SOSY
40.0000 mg | PREFILLED_SYRINGE | INTRAMUSCULAR | Status: DC
  Administered 2022-10-21: 40 mg via SUBCUTANEOUS
  Filled 2022-10-20: qty 0.4

## 2022-10-20 MED ORDER — BUPIVACAINE HCL (PF) 0.25 % IJ SOLN
INTRAMUSCULAR | Status: AC
  Filled 2022-10-20: qty 30

## 2022-10-20 MED ORDER — SUGAMMADEX SODIUM 500 MG/5ML IV SOLN
INTRAVENOUS | Status: DC | PRN
  Administered 2022-10-20: 200 mg via INTRAVENOUS

## 2022-10-20 MED ORDER — BUPIVACAINE HCL (PF) 0.25 % IJ SOLN
INTRAMUSCULAR | Status: DC | PRN
  Administered 2022-10-20: 30 mL

## 2022-10-20 MED ORDER — OXYCODONE HCL 5 MG/5ML PO SOLN: 5.0000 mg | Freq: Once | ORAL | Status: DC | PRN

## 2022-10-20 MED ORDER — LORATADINE 10 MG PO TABS
10.0000 mg | ORAL_TABLET | Freq: Every day | ORAL | Status: DC
  Filled 2022-10-20: qty 1

## 2022-10-20 MED ORDER — ROCURONIUM BROMIDE 100 MG/10ML IV SOLN
INTRAVENOUS | Status: DC | PRN
  Administered 2022-10-20: 50 mg via INTRAVENOUS

## 2022-10-20 MED ORDER — DIPHENHYDRAMINE HCL 50 MG/ML IJ SOLN: 12.5000 mg | Freq: Four times a day (QID) | INTRAMUSCULAR | Status: DC | PRN

## 2022-10-20 MED ORDER — SPIRONOLACTONE 25 MG PO TABS
50.0000 mg | ORAL_TABLET | Freq: Every day | ORAL | Status: DC
  Administered 2022-10-20 – 2022-10-21 (×2): 50 mg via ORAL
  Filled 2022-10-20 (×2): qty 2

## 2022-10-20 MED ORDER — ORAL CARE MOUTH RINSE: 15.0000 mL | Freq: Once | OROMUCOSAL | Status: AC

## 2022-10-20 MED ORDER — DEXAMETHASONE SODIUM PHOSPHATE 10 MG/ML IJ SOLN
INTRAMUSCULAR | Status: DC | PRN
  Administered 2022-10-20: 4 mg via INTRAVENOUS

## 2022-10-20 MED ORDER — EPHEDRINE SULFATE (PRESSORS) 50 MG/ML IJ SOLN
INTRAMUSCULAR | Status: DC | PRN
  Administered 2022-10-20: 10 mg via INTRAVENOUS

## 2022-10-20 MED ORDER — ROCURONIUM BROMIDE 10 MG/ML (PF) SYRINGE
PREFILLED_SYRINGE | INTRAVENOUS | Status: AC
  Filled 2022-10-20: qty 10

## 2022-10-20 MED ORDER — CHLORHEXIDINE GLUCONATE CLOTH 2 % EX PADS: 6.0000 | MEDICATED_PAD | Freq: Once | CUTANEOUS | Status: DC

## 2022-10-20 MED ORDER — CHLORHEXIDINE GLUCONATE 0.12 % MT SOLN
15.0000 mL | Freq: Once | OROMUCOSAL | Status: AC
  Administered 2022-10-20: 15 mL via OROMUCOSAL

## 2022-10-20 MED ORDER — ONDANSETRON HCL 4 MG/2ML IJ SOLN
4.0000 mg | Freq: Once | INTRAMUSCULAR | Status: AC | PRN
  Administered 2022-10-20: 4 mg via INTRAVENOUS

## 2022-10-20 MED ORDER — MIDAZOLAM HCL 5 MG/5ML IJ SOLN
INTRAMUSCULAR | Status: DC | PRN
  Administered 2022-10-20: 2 mg via INTRAVENOUS

## 2022-10-20 MED ORDER — ONDANSETRON HCL 4 MG/2ML IJ SOLN: 4.0000 mg | Freq: Four times a day (QID) | INTRAMUSCULAR | Status: DC | PRN

## 2022-10-20 MED ORDER — ALBUTEROL SULFATE (2.5 MG/3ML) 0.083% IN NEBU
2.5000 mg | INHALATION_SOLUTION | Freq: Four times a day (QID) | RESPIRATORY_TRACT | Status: DC | PRN
  Administered 2022-10-20: 2.5 mg via RESPIRATORY_TRACT
  Filled 2022-10-20 (×2): qty 3

## 2022-10-20 MED ORDER — FENTANYL CITRATE PF 50 MCG/ML IJ SOSY
PREFILLED_SYRINGE | INTRAMUSCULAR | Status: AC
  Filled 2022-10-20: qty 1

## 2022-10-20 MED ORDER — OXYCODONE HCL 5 MG PO TABS: 5.0000 mg | ORAL_TABLET | Freq: Once | ORAL | Status: DC | PRN

## 2022-10-20 MED ORDER — INSULIN ASPART 100 UNIT/ML IJ SOLN
0.0000 [IU] | Freq: Three times a day (TID) | INTRAMUSCULAR | Status: DC
  Administered 2022-10-20: 5 [IU] via SUBCUTANEOUS
  Administered 2022-10-20: 8 [IU] via SUBCUTANEOUS
  Administered 2022-10-21: 3 [IU] via SUBCUTANEOUS

## 2022-10-20 MED ORDER — INSULIN GLARGINE-YFGN 100 UNIT/ML ~~LOC~~ SOLN
80.0000 [IU] | Freq: Every day | SUBCUTANEOUS | Status: DC
  Administered 2022-10-20 – 2022-10-21 (×2): 80 [IU] via SUBCUTANEOUS
  Filled 2022-10-20 (×2): qty 0.8

## 2022-10-20 MED ORDER — FENTANYL CITRATE (PF) 250 MCG/5ML IJ SOLN
INTRAMUSCULAR | Status: AC
  Filled 2022-10-20: qty 5

## 2022-10-20 MED ORDER — ACETAMINOPHEN 500 MG PO TABS
1000.0000 mg | ORAL_TABLET | ORAL | Status: AC
  Administered 2022-10-20: 1000 mg via ORAL
  Filled 2022-10-20: qty 2

## 2022-10-20 MED ORDER — SERTRALINE HCL 50 MG PO TABS: 50.0000 mg | ORAL_TABLET | Freq: Every day | ORAL | Status: DC

## 2022-10-20 MED ORDER — MIDAZOLAM HCL 2 MG/2ML IJ SOLN
INTRAMUSCULAR | Status: AC
  Filled 2022-10-20: qty 2

## 2022-10-20 MED ORDER — POLYVINYL ALCOHOL 1.4 % OP SOLN
Freq: Every day | OPHTHALMIC | Status: DC
  Filled 2022-10-20: qty 15

## 2022-10-20 MED ORDER — ALBUTEROL SULFATE HFA 108 (90 BASE) MCG/ACT IN AERS: 1.0000 | INHALATION_SPRAY | Freq: Four times a day (QID) | RESPIRATORY_TRACT | Status: DC | PRN

## 2022-10-20 MED ORDER — 0.9 % SODIUM CHLORIDE (POUR BTL) OPTIME
TOPICAL | Status: DC | PRN
  Administered 2022-10-20: 1000 mL

## 2022-10-20 MED ORDER — FENTANYL CITRATE PF 50 MCG/ML IJ SOSY
25.0000 ug | PREFILLED_SYRINGE | INTRAMUSCULAR | Status: DC | PRN
  Administered 2022-10-20: 50 ug via INTRAVENOUS

## 2022-10-20 MED ORDER — METFORMIN HCL ER 500 MG PO TB24
1000.0000 mg | ORAL_TABLET | Freq: Two times a day (BID) | ORAL | Status: DC
  Administered 2022-10-20 – 2022-10-21 (×2): 1000 mg via ORAL
  Filled 2022-10-20 (×2): qty 2

## 2022-10-20 MED ORDER — ONDANSETRON HCL 4 MG/2ML IJ SOLN
INTRAMUSCULAR | Status: DC | PRN
  Administered 2022-10-20: 4 mg via INTRAVENOUS

## 2022-10-20 MED ORDER — DIPHENHYDRAMINE HCL 12.5 MG/5ML PO ELIX
12.5000 mg | ORAL_SOLUTION | Freq: Four times a day (QID) | ORAL | Status: DC | PRN
  Administered 2022-10-20 – 2022-10-21 (×2): 12.5 mg via ORAL
  Filled 2022-10-20 (×2): qty 5

## 2022-10-20 MED ORDER — POLYETHYLENE GLYCOL 3350 17 G PO PACK: 17.0000 g | PACK | Freq: Every day | ORAL | Status: DC | PRN

## 2022-10-20 MED ORDER — VANCOMYCIN HCL IN DEXTROSE 1-5 GM/200ML-% IV SOLN
1000.0000 mg | INTRAVENOUS | Status: AC
  Administered 2022-10-20: 1000 mg via INTRAVENOUS
  Filled 2022-10-20: qty 200

## 2022-10-20 MED ORDER — METOPROLOL TARTRATE 5 MG/5ML IV SOLN: 5.0000 mg | Freq: Four times a day (QID) | INTRAVENOUS | Status: DC | PRN

## 2022-10-20 MED ORDER — LACTATED RINGERS IV SOLN: INTRAVENOUS | Status: DC

## 2022-10-20 MED ORDER — SERTRALINE HCL 50 MG PO TABS
50.0000 mg | ORAL_TABLET | Freq: Every day | ORAL | Status: DC
  Filled 2022-10-20: qty 1

## 2022-10-20 MED ORDER — MOMETASONE FURO-FORMOTEROL FUM 100-5 MCG/ACT IN AERO
2.0000 | INHALATION_SPRAY | Freq: Two times a day (BID) | RESPIRATORY_TRACT | Status: DC
  Administered 2022-10-20 – 2022-10-21 (×2): 2 via RESPIRATORY_TRACT
  Filled 2022-10-20: qty 8.8

## 2022-10-20 MED ORDER — DEXAMETHASONE SODIUM PHOSPHATE 10 MG/ML IJ SOLN
INTRAMUSCULAR | Status: AC
  Filled 2022-10-20: qty 1

## 2022-10-20 MED ORDER — BUPIVACAINE LIPOSOME 1.3 % IJ SUSP
INTRAMUSCULAR | Status: DC | PRN
  Administered 2022-10-20: 20 mL

## 2022-10-20 MED ORDER — HYDROMORPHONE HCL 1 MG/ML IJ SOLN
0.5000 mg | INTRAMUSCULAR | Status: DC | PRN
  Administered 2022-10-20: 0.5 mg via INTRAVENOUS
  Filled 2022-10-20: qty 0.5

## 2022-10-20 MED ORDER — PROPOFOL 10 MG/ML IV BOLUS
INTRAVENOUS | Status: AC
  Filled 2022-10-20: qty 20

## 2022-10-20 SURGICAL SUPPLY — 33 items
BAG COUNTER SPONGE SURGICOUNT (BAG) IMPLANT
BINDER ABDOMINAL 12 ML 46-62 (SOFTGOODS) IMPLANT
BLADE SURG SZ10 CARB STEEL (BLADE) ×1 IMPLANT
CHLORAPREP W/TINT 26 (MISCELLANEOUS) ×1 IMPLANT
COVER SURGICAL LIGHT HANDLE (MISCELLANEOUS) ×1 IMPLANT
DERMABOND ADVANCED .7 DNX12 (GAUZE/BANDAGES/DRESSINGS) ×1 IMPLANT
DRAPE LAPAROSCOPIC ABDOMINAL (DRAPES) ×1 IMPLANT
ELECT REM PT RETURN 15FT ADLT (MISCELLANEOUS) ×1 IMPLANT
GLOVE BIOGEL PI IND STRL 7.0 (GLOVE) ×1 IMPLANT
GLOVE SURG SS PI 7.0 STRL IVOR (GLOVE) ×1 IMPLANT
GOWN STRL REUS W/ TWL LRG LVL3 (GOWN DISPOSABLE) ×1 IMPLANT
GOWN STRL REUS W/ TWL XL LVL3 (GOWN DISPOSABLE) IMPLANT
GOWN STRL REUS W/TWL LRG LVL3 (GOWN DISPOSABLE) ×1
GOWN STRL REUS W/TWL XL LVL3 (GOWN DISPOSABLE)
KIT BASIN OR (CUSTOM PROCEDURE TRAY) ×2 IMPLANT
KIT TURNOVER KIT A (KITS) IMPLANT
MARKER SKIN DUAL TIP RULER LAB (MISCELLANEOUS) ×1 IMPLANT
MESH BARD SOFT 6X6IN (Mesh General) IMPLANT
NEEDLE HYPO 22GX1.5 SAFETY (NEEDLE) ×1 IMPLANT
PACK BASIC VI WITH GOWN DISP (CUSTOM PROCEDURE TRAY) IMPLANT
PENCIL SMOKE EVACUATOR (MISCELLANEOUS) ×1 IMPLANT
SPIKE FLUID TRANSFER (MISCELLANEOUS) ×1 IMPLANT
SPONGE T-LAP 4X18 ~~LOC~~+RFID (SPONGE) ×1 IMPLANT
SUT MNCRL AB 4-0 PS2 18 (SUTURE) ×1 IMPLANT
SUT NOVA NAB GS-21 0 18 T12 DT (SUTURE) IMPLANT
SUT PDS AB 0 CT1 36 (SUTURE) IMPLANT
SUT VIC AB 3-0 SH 27 (SUTURE) ×2
SUT VIC AB 3-0 SH 27X BRD (SUTURE) ×1 IMPLANT
SUT VIC AB 3-0 SH 27XBRD (SUTURE) IMPLANT
SYR CONTROL 10ML LL (SYRINGE) ×1 IMPLANT
TOWEL OR 17X26 10 PK STRL BLUE (TOWEL DISPOSABLE) ×1 IMPLANT
TOWEL OR NON WOVEN STRL DISP B (DISPOSABLE) ×1 IMPLANT
YANKAUER SUCT BULB TIP 10FT TU (MISCELLANEOUS) ×1 IMPLANT

## 2022-10-20 NOTE — Anesthesia Postprocedure Evaluation (Signed)
Anesthesia Post Note  Patient: Seanna Sisler Tvedt  Procedure(s) Performed: OPEN HERNIA REPAIR UMBILICAL ADULT with Mesh     Patient location during evaluation: PACU Anesthesia Type: General Level of consciousness: awake and alert Pain management: pain level controlled Vital Signs Assessment: post-procedure vital signs reviewed and stable Respiratory status: spontaneous breathing, nonlabored ventilation and respiratory function stable Cardiovascular status: stable and blood pressure returned to baseline Anesthetic complications: yes   Encounter Notable Events  Notable Event Outcome Phase Comment  Difficult to intubate - expected  Intraprocedure Filed from anesthesia note documentation.    Last Vitals:  Vitals:   10/20/22 0945 10/20/22 1007  BP: (!) 142/59 (!) 116/40  Pulse: 73 74  Resp: 14 16  Temp:  36.5 C  SpO2: 95% 93%    Last Pain:  Vitals:   10/20/22 1007  TempSrc: Oral  PainSc:                  Audry Pili

## 2022-10-20 NOTE — Progress Notes (Signed)
PT Cancellation Note  Patient Details Name: Janice [name] MRN: [id number] DOB: [birth year]   Cancelled Treatment:    Reason Eval/Treat Not Completed: Other (comment) currently eating lunch, will return shortly.   Deniece Ree PT DPT PN2

## 2022-10-20 NOTE — H&P (Signed)
Chief Complaint: Re-Check (Umbilical Hernia )  History of Present Illness: Janice [name] is a 72 y.o. female who is seen today for umbilical hernia.  She continues to have some sensitivity to her umbilical hernia with pressure. She is eating well. She has received iron infused over the last month for anemia.  Review of Systems: A complete review of systems was obtained from the patient. I have reviewed this information and discussed as appropriate with the patient. See HPI as well for other ROS.  Review of Systems  Constitutional: Positive for malaise/fatigue.  HENT: Negative.  Eyes: Negative.  Respiratory: Negative.  Cardiovascular: Negative.  Genitourinary: Negative.  Musculoskeletal: Negative.  Skin: Negative.  Neurological: Negative.  Endo/Heme/Allergies: Negative.  Psychiatric/Behavioral: Negative.   Medical History: Past Medical History:  Diagnosis Date  Anemia  Arthritis  Diabetes mellitus without complication (CMS-HCC)  GERD (gastroesophageal reflux disease)  History of cancer  Hyperlipidemia  Hypertension  Liver disease  Sleep apnea   There is no problem list on file for this patient.  Past Surgical History:  Procedure Laterality Date  EXCISION RIGHT POSTERIOR NECK MASS 06/24/2021  Dr. Kieth Brightly  HERNIA REPAIR  Umbilical  left knee replacement Left  rotator cuff surgery N/A    Allergies  Allergen Reactions  Benzonatate Anaphylaxis and Hives  Fluticasone Furoate-Vilanterol Anaphylaxis and Hives  Iodine Hives, Other (See Comments) and Swelling  And Betadine/caused hives and swelling  Latex Hives, Itching, Other (See Comments) and Rash  Burning  Simvastatin Other (See Comments)  Caused neuropathy in arms Neuropathy in arms  Bupropion Other (See Comments)  Negative thoughts. Crazy thoughts  Levofloxacin Other (See Comments)  Joint aches//leg, shoulder pain Joint pain  Penicillins Hives and Rash  Blisters Did it involve swelling of the  face/tongue/throat, SOB, or low BP? Yes Did it involve sudden or severe rash/hives, skin peeling, or any reaction on the inside of your mouth or nose? Yes Did you need to seek medical attention at a hospital or doctor's office?Unknown When did it last happen? 2019  If all above answers are "NO", may proceed with cephalosporin use.  Povidone-Iodine Hives  Promethazine Nausea and Other (See Comments)  Doesn't work  Chlorthalidone Other (See Comments)  Ears ringing  Fluconazole Hives and Other (See Comments)  blister  Doxycycline Diarrhea and Itching   Current Outpatient Medications on File Prior to Visit  Medication Sig Dispense Refill  albuterol 90 mcg/actuation inhaler Inhale into the lungs  amLODIPine (NORVASC) 10 MG tablet Take 1 tablet by mouth once daily  ascorbic acid, vitamin C, (VITAMIN C) 500 MG tablet Take by mouth  aspirin 81 MG EC tablet Take by mouth  azithromycin (ZITHROMAX) 250 MG tablet TAKE 2 TABLETS BY MOUTH ON DAY 1, AND THEN TAKE 1 TABLET BY MOUTH ONCE A DAY ON DAY 2 THROUGH DAY 5  cefdinir (OMNICEF) 300 mg capsule Take 300 mg by mouth 2 (two) times daily  chlorpheniramine (CHLOR-TRIMETON) 4 mg tablet Take by mouth  desoximetasone (TOPICORT) 0.25 % cream Apply topically  flu vacc qs 2022-23, PF,, 65 yrs+, (FLUZONE HIGHDOSE QUAD 22-23 PF) 240 mcg/0.7 mL IM Syringe  fluticasone propionate (FLONASE) 50 mcg/actuation nasal spray USE 2 SPRAY(S) IN EACH NOSTRIL IN THE MORNING  gabapentin (NEURONTIN) 300 MG capsule Take by mouth  hydrALAZINE (APRESOLINE) 25 MG tablet Take 1 tablet by mouth 2 (two) times daily  insulin NPH (HUMULIN N) injection (concentration 100 units/mL) Inject subcutaneously  insulin REGULAR (HUMULIN R) injection (concentration 100 units/mL) Inject subcutaneously  irbesartan (AVAPRO)  300 MG tablet Take 1 tablet by mouth once daily  metFORMIN (GLUCOPHAGE-XR) 500 MG XR tablet Take 1,000 mg by mouth 2 (two) times daily  naproxen (NAPROSYN) 500 MG tablet  Take 500 mg by mouth  NOVOLOG FLEXPEN U-100 INSULIN pen injector (concentration 100 units/mL) inject 30 units with each meal Subcutaneous three times daily for 30 days  omeprazole (PRILOSEC) 40 MG DR capsule TAKE 1 CAPSULE BY MOUTH TWICE DAILY FOR 90 DAYS  ONETOUCH VERIO FLEX METER Misc  ONETOUCH VERIO TEST STRIPS test strip  rosuvastatin (CRESTOR) 10 MG tablet Take 1 tablet by mouth once daily  sertraline (ZOLOFT) 50 MG tablet Take by mouth  spironolactone (ALDACTONE) 50 MG tablet Take 50 mg by mouth once daily  TRESIBA FLEXTOUCH U-200 pen injector (concentration 200 units/mL) inject 80 units Subcutaneous once daily for 30 days   No current facility-administered medications on file prior to visit.   Family History  Problem Relation Age of Onset  Breast cancer Mother  Stroke Father  Obesity Father  High blood pressure (Hypertension) Father  Hyperlipidemia (Elevated cholesterol) Father  Diabetes Father  Obesity Sister  Diabetes Sister  Obesity Brother  High blood pressure (Hypertension) Brother  Diabetes Brother    Social History   Tobacco Use  Smoking Status Never  Smokeless Tobacco Never    Social History   Socioeconomic History  Marital status: Widowed  Tobacco Use  Smoking status: Never  Smokeless tobacco: Never  Vaping Use  Vaping Use: Never used  Substance and Sexual Activity  Alcohol use: Never  Drug use: Never   Objective:   There were no vitals filed for this visit.  There is no height or weight on file to calculate BMI.  Physical Exam Constitutional:  Appearance: Normal appearance.  HENT:  Head: Normocephalic and atraumatic.  Pulmonary:  Effort: Pulmonary effort is normal.  Abdominal:  Comments: Moderate soft umbilical hernia, abdominal obesity  Musculoskeletal:  General: Normal range of motion.  Cervical back: Normal range of motion.  Neurological:  General: No focal deficit present.  Mental Status: She is alert and oriented to person,  place, and time. Mental status is at baseline.  Psychiatric:  Mood and Affect: Mood normal.  Behavior: Behavior normal.  Thought Content: Thought content normal.    Labs, Imaging and Diagnostic Testing:  I reviewed recent PET/CT images. I reviewed notes by Silvano Rusk and Sullivan Lone  Assessment and Plan:   Diagnoses and all orders for this visit:  Umbilical hernia without obstruction or gangrene  CLL (chronic lymphocytic leukemia) (CMS-HCC)  NASH (nonalcoholic steatohepatitis)  Class 2 severe obesity with body mass index (BMI) of 35 to 39.9 with serious comorbidity (CMS-HCC)    She has a moderate umbilical hernia. She is a complicated patient with CLL that is being watched and NASH with splenomegaly, concern for varices, and trace ascites. Her lab work is within normal limits. Her symptoms are present but minor. We had a long discussion about fixing the hernia or continuing with observation. The cancer team is considering starting an antibody treatment followed by chemotherapy. We discussed that the umbilical hernia surgery would not likely lead to life prolonging and because of liver disease has significant risk. She will continue to think about her options.  No follow-ups on file.

## 2022-10-20 NOTE — Op Note (Signed)
PATIENT:  Janice [name]  72 y.o. female  PRE-OPERATIVE DIAGNOSIS:  UMBILICAL HERNIA  POST-OPERATIVE DIAGNOSIS:  UMBILICAL HERNIA  PROCEDURE:  Procedure(s): OPEN HERNIA REPAIR UMBILICAL ADULT with Mesh   SURGEON:  Surgeon(s): Tobie Perdue, [name], MD  ASSISTANT: none   ANESTHESIA:   local and general  Indications for procedure: Janice [name] is a 72 y.o. year old female with symptoms of abdominal pain and umbilical hernia.  Description of procedure: The patient was brought into the operative suite. Anesthesia was administered with General endotracheal anesthesia. WHO checklist was applied. The patient was then placed in supine. The area was prepped and draped in the usual sterile fashion.  Next the infraumbilical skin was anesthetized with Marcaine/Exparel mix. A semilunar infraumbilical incision was made. Cautery and blunt dissection was used to dissect down to the fascia. The hernia sac was dissected free from surrounding tissues in 360 degrees. The umbilical skin was dissected free of the hernia sac with cautery. The hernia sac was reduced and contained no visceral structures. The hernia defect was 2 cm in diameter. The hernia sac was removed. Due to the size of the hernia, a 10 cm Bard mesh was inserted into the preperitoneal space. Care was taken to lay the mesh flat. The mesh was secured to the fascia with 0 Novafil in 4 areas. The fascial defect was then primarily closed with interrupted 0 PDS sutures. The umbilical skin was sutured to the fascia with a 3-0 vicryl. The deep dermal space was closed with a 3-0 vicryl. Marcaine/Exparel mix was injected into the muscle layer and around the fascia. The skin was closed with a 4-0 monocryl subcuticular suture. Dermabond was put in place for dressing. The patient awoke from anesthesia and was brought to pacu in stable condition. All counts were correct.  Findings: 2 cm umbilical hernia with sac containing thin fluid  Specimen:  none  Blood loss: 10 ml  Local anesthesia: 50 ml Marcaine/Exparel mix  Complications: none  PLAN OF CARE: Admit for overnight observation  PATIENT DISPOSITION:  PACU - hemodynamically stable.  Gurney Maxin, M.D. General, Bariatric, & Minimally Invasive Surgery [facility], [place]  10/20/2022 8:49 AM

## 2022-10-20 NOTE — Transfer of Care (Signed)
Immediate Anesthesia Transfer of Care Note  Patient: Janice Mungin Ma  Procedure(s) Performed: OPEN HERNIA REPAIR UMBILICAL ADULT with Mesh  Patient Location: PACU  Anesthesia Type:General  Level of Consciousness: awake, alert , oriented, and patient cooperative  Airway & Oxygen Therapy: Patient Spontanous Breathing and Patient connected to face mask oxygen  Post-op Assessment: Report given to RN, Post -op Vital signs reviewed and stable, and Patient moving all extremities X 4  Post vital signs: Reviewed and stable  Last Vitals:  Vitals Value Taken Time  BP 143/62 10/20/22 0856  Temp    Pulse 74 10/20/22 0857  Resp 15 10/20/22 0857  SpO2 98 % 10/20/22 0857  Vitals shown include unvalidated device data.  Last Pain:  Vitals:   10/20/22 0644  TempSrc:   PainSc: 0-No pain         Complications:  Encounter Notable Events  Notable Event Outcome Phase Comment  Difficult to intubate - expected  Intraprocedure Filed from anesthesia note documentation.

## 2022-10-20 NOTE — Anesthesia Procedure Notes (Signed)
Procedure Name: Intubation Date/Time: 10/20/2022 7:43 AM  Performed by: Jonna Munro, CRNAPre-anesthesia Checklist: Patient identified, Emergency Drugs available, Suction available, Patient being monitored and Timeout performed Patient Re-evaluated:Patient Re-evaluated prior to induction Oxygen Delivery Method: Circle system utilized Preoxygenation: Pre-oxygenation with 100% oxygen Induction Type: IV induction Ventilation: Mask ventilation without difficulty Laryngoscope Size: Mac, 3 and Glidescope Grade View: Grade II Tube type: Oral Tube size: 7.0 mm Number of attempts: 2 Airway Equipment and Method: Stylet Placement Confirmation: ETT inserted through vocal cords under direct vision, positive ETCO2, CO2 detector and breath sounds checked- equal and bilateral Secured at: 22 cm Tube secured with: Tape Dental Injury: Teeth and Oropharynx as per pre-operative assessment  Difficulty Due To: Difficulty was anticipated Comments: DLx 1 with MAC 4, unable to see cords, DL with lopro S3 glidescope, easy atraumatic intubation, VSS throughout induction

## 2022-10-20 NOTE — Evaluation (Signed)
Physical Therapy Evaluation Patient Details Name: Janice [name] MRN: [id number] DOB: 1951-04-17 Today's Date: 10/20/2022  History of Present Illness  72yo female who received open hernia repair with mesh 10/20/22. PMH anemia, DM, CA, HLD, HTN, liver disease, L TKR, rotator cuff repair  Clinical Impression   Patient received in recliner, pleasant and cooperative this afternoon. Able to mobilize well today, only needed min guard/cues for safety and assist with line management. Did have a desat to 88% on room air, able to maintain above 96% on 2LPM with mobility in the hallway. Left up in recliner with all needs met, family present and RN aware of pt status. Really did well, may benefit from skilled OP PT once deemed ready per surgeon.        Recommendations for follow up therapy are one component of a multi-disciplinary discharge planning process, led by the attending physician.  Recommendations may be updated based on patient status, additional functional criteria and insurance authorization.  Follow Up Recommendations Outpatient PT (when medically ready per surgeon)      Assistance Recommended at Discharge Intermittent Supervision/Assistance  Patient can return home with the following  A little help with walking and/or transfers;Assistance with cooking/housework;Assist for transportation;A little help with bathing/dressing/bathroom;Help with stairs or ramp for entrance    Equipment Recommendations Rolling walker (2 wheels);BSC/3in1  Recommendations for Other Services       Functional Status Assessment Patient has had a recent decline in their functional status and demonstrates the ability to make significant improvements in function in a reasonable and predictable amount of time.     Precautions / Restrictions Precautions Precautions: Other (comment) Precaution Comments: s/p abdominal surgery, watch O2 Restrictions Weight Bearing Restrictions: No      Mobility  Bed Mobility                General bed mobility comments: up in recliner upon entry    Transfers Overall transfer level: Needs assistance Equipment used: Rolling walker (2 wheels) Transfers: Sit to/from Stand Sit to Stand: Min guard           General transfer comment: min guard for safety/cues for hand placement    Ambulation/Gait Ambulation/Gait assistance: Supervision Gait Distance (Feet): 224 Feet (61f, 136f 20019fAssistive device: Rolling walker (2 wheels) Gait Pattern/deviations: Step-through pattern, Wide base of support Gait velocity: decreased     General Gait Details: slow but steady with RW no physical assist given other than line management  Stairs            Wheelchair Mobility    Modified Rankin (Stroke Patients Only)       Balance Overall balance assessment: Mild deficits observed, not formally tested                                           Pertinent Vitals/Pain Pain Assessment Pain Assessment: No/denies pain    Home Living Family/patient expects to be discharged to:: Private residence Living Arrangements: Alone Available Help at Discharge: Family;Available 24 hours/day Type of Home: Apartment Home Access: Stairs to enter Entrance Stairs-Rails: None Entrance Stairs-Number of Steps: but does have 2 small steps to enter, can enter back door with key this is a flat entrance but would have to walk over uneven ground to get there   Home Layout: One level Home Equipment: Cane - single point;Rolling Walker (2 wheels) Additional Comments: 2 falls in  the  past 6 months, was having leg cramps- trying to get OOB and put heel down to help and couldn't get up, every time she tried to stand she got closer to the edge of the bed and fell off the edge; other fall she was having a coughing spasm and fell onto her knee    Prior Function Prior Level of Function : Independent/Modified Independent             Mobility Comments: not on O2  at baseline       Hand Dominance   Dominant Hand: Right    Extremity/Trunk Assessment   Upper Extremity Assessment Upper Extremity Assessment: Defer to OT evaluation    Lower Extremity Assessment Lower Extremity Assessment: Generalized weakness    Cervical / Trunk Assessment Cervical / Trunk Assessment: Kyphotic  Communication   Communication: No difficulties  Cognition Arousal/Alertness: Awake/alert Behavior During Therapy: WFL for tasks assessed/performed Overall Cognitive Status: Within Functional Limits for tasks assessed                                 General Comments: very pleasant and cooperative        General Comments General comments (skin integrity, edema, etc.): SpO2 to 88% on room air walking to/from bathroom, used 2LPM In hallway and stayed above 96%, HR WNL    Exercises     Assessment/Plan    PT Assessment Patient needs continued PT services  PT Problem List Decreased strength;Decreased knowledge of use of DME;Obesity;Decreased activity tolerance;Decreased safety awareness;Pain;Decreased mobility;Decreased coordination       PT Treatment Interventions DME instruction;Balance training;Gait training;Neuromuscular re-education;Stair training;Functional mobility training;Patient/family education;Therapeutic activities;Therapeutic exercise    PT Goals (Current goals can be found in the Care Plan section)  Acute Rehab PT Goals Patient Stated Goal: go home tomorrow PT Goal Formulation: With patient/family Time For Goal Achievement: 11/03/22 Potential to Achieve Goals: Good    Frequency Min 3X/week     Co-evaluation               AM-PAC PT "6 Clicks" Mobility  Outcome Measure Help needed turning from your back to your side while in a flat bed without using bedrails?: A Little Help needed moving from lying on your back to sitting on the side of a flat bed without using bedrails?: A Little Help needed moving to and from a bed to  a chair (including a wheelchair)?: A Little Help needed standing up from a chair using your arms (e.g., wheelchair or bedside chair)?: A Little Help needed to walk in hospital room?: A Little Help needed climbing 3-5 steps with a railing? : A Little 6 Click Score: 18    End of Session Equipment Utilized During Treatment: Other (comment) (abdominal binder) Activity Tolerance: Patient tolerated treatment well Patient left: in chair;with call bell/phone within reach;with family/visitor present Nurse Communication: Mobility status;Other (comment) (desat on room air) PT Visit Diagnosis: Muscle weakness (generalized) (M62.81);Difficulty in walking, not elsewhere classified (R26.2);Pain Pain - Right/Left:  (abdomen) Pain - part of body:  (abdomen)    Time: 7425-9563 PT Time Calculation (min) (ACUTE ONLY): 42 min   Charges:   PT Evaluation $PT Eval Low Complexity: 1 Low PT Treatments $Gait Training: 8-22 mins $Therapeutic Activity: 8-22 mins       Deniece Ree PT DPT PN2

## 2022-10-21 ENCOUNTER — Encounter: Payer: Self-pay | Admitting: General Surgery

## 2022-10-21 ENCOUNTER — Other Ambulatory Visit (HOSPITAL_COMMUNITY): Payer: Self-pay

## 2022-10-21 ENCOUNTER — Encounter (HOSPITAL_COMMUNITY): Payer: Self-pay | Admitting: General Surgery

## 2022-10-21 DIAGNOSIS — K429 Umbilical hernia without obstruction or gangrene: Secondary | ICD-10-CM | POA: Diagnosis not present

## 2022-10-21 LAB — GLUCOSE, CAPILLARY: Glucose-Capillary: 153 mg/dL — ABNORMAL HIGH (ref 70–99)

## 2022-10-21 MED ORDER — OXYCODONE HCL 5 MG PO TABS
5.0000 mg | ORAL_TABLET | Freq: Four times a day (QID) | ORAL | 0 refills | Status: DC | PRN
  Filled 2022-10-21: qty 20, 5d supply, fill #0

## 2022-10-21 MED ORDER — NOVOLOG FLEXPEN 100 UNIT/ML ~~LOC~~ SOPN: PEN_INJECTOR | SUBCUTANEOUS | 11 refills | Status: DC

## 2022-10-21 NOTE — Discharge Instructions (Signed)
[facility] _______[facility]  UMBILICAL OR INGUINAL HERNIA REPAIR: POST OP INSTRUCTIONS  Always review your discharge instruction sheet given to you by the facility where your surgery was performed. IF YOU HAVE DISABILITY OR FAMILY LEAVE FORMS, YOU MUST BRING THEM TO THE OFFICE FOR PROCESSING.   DO NOT GIVE THEM TO YOUR DOCTOR.  1. A  prescription for pain medication may be given to you upon discharge.  Take your pain medication as prescribed, if needed.  If narcotic pain medicine is not needed, then you may take acetaminophen (Tylenol) or ibuprofen (Advil) as needed. 2. Take your usually prescribed medications unless otherwise directed. If you need a refill on your pain medication, please contact your pharmacy.  They will contact our office to request authorization. Prescriptions will not be filled after 5 pm or on week-ends. 3. You should follow a light diet the first 24 hours after arrival home, such as soup and crackers, etc.  Be sure to include lots of fluids daily.  Resume your normal diet the day after surgery. 4.Most patients will experience some swelling and bruising around the umbilicus or in the groin and scrotum.  Ice packs and reclining will help.  Swelling and bruising can take several days to resolve.  6. It is common to experience some constipation if taking pain medication after surgery.  Increasing fluid intake and taking a stool softener (such as Colace) will usually help or prevent this problem from occurring.  A mild laxative (Milk of Magnesia or Miralax) should be taken according to package directions if there are no bowel movements after 48 hours. 7. Unless discharge instructions indicate otherwise, you may remove your bandages 24-48 hours after surgery, and you may shower at that time.  You may have steri-strips (small skin tapes) in place directly over the incision.  These strips should be left on the skin for 7-10 days.  If your surgeon used skin glue on the  incision, you may shower in 24 hours.  The glue will flake off over the next 2-3 weeks.  Any sutures or staples will be removed at the office during your follow-up visit. 8. ACTIVITIES:  You may resume regular (light) daily activities beginning the next day--such as daily self-care, walking, climbing stairs--gradually increasing activities as tolerated.  You may have sexual intercourse when it is comfortable.  Refrain from any heavy lifting or straining until approved by your doctor.  a.You may drive when you are no longer taking prescription pain medication, you can comfortably wear a seatbelt, and you can safely maneuver your car and apply brakes. b.RETURN TO WORK:   _____________________________________________  9.You should see your doctor in the office for a follow-up appointment approximately 2-3 weeks after your surgery.  Make sure that you call for this appointment within a day or two after you arrive home to insure a convenient appointment time. 10.OTHER INSTRUCTIONS: _________________________    _____________________________________  WHEN TO CALL YOUR DOCTOR: Fever over 101.0 Inability to urinate Nausea and/or vomiting Extreme swelling or bruising Continued bleeding from incision. Increased pain, redness, or drainage from the incision  The clinic staff is available to answer your questions during regular business hours.  Please don't hesitate to call and ask to speak to one of the nurses for clinical concerns.  If you have a medical emergency, go to the nearest emergency room or call 911.  A surgeon from [facility] is always on call at the hospital   [address], [address],   [place], [place]  [address] ?  [address] [address], [place], [place]   [address] [phone] ? [phone] ? FAX [phone] Web site: [email or web address]  

## 2022-10-21 NOTE — TOC CM/SW Note (Signed)
Transition of Care [facility]) Screening Note  Patient Details  Name: Kataryna Kin Date of Birth: Jul 22, 1951  Transition of Care [facility] CM/SW Contact:    Sherie Don, LCSW Phone Number: 10/21/2022, 11:37 AM  Transition of Care Department [facility]) has reviewed patient and no TOC needs have been identified at this time. We will continue to monitor patient advancement through interdisciplinary progression rounds. If new patient transition needs arise, please place a TOC consult.

## 2022-10-21 NOTE — Progress Notes (Signed)
Patient was given discharge instructions, and all questions were answered.  Patient was stable for discharge and was taken to the main exit by wheelchair. 

## 2022-10-21 NOTE — Discharge Summary (Signed)
Physician Discharge Summary  Janice [name] [id number] DOB: 1951-02-18 DOA: 10/20/2022  PCP: Prince Solian, MD  Admit date: 10/20/2022 Discharge date: 10/21/2022  Recommendations for Outpatient Follow-up:   (include homehealth, outpatient follow-up instructions, specific recommendations for PCP to follow-up on, etc.)   Follow-up Information     Janice [name], Janice Bruce, MD Follow up on 11/08/2022.   Specialty: General Surgery Contact information: [id number] [place] [address] [phone]                Discharge Diagnoses:  Principal Problem:   Umbilical hernia   Surgical Procedure: umbilical hernia repair with mesh  Discharge Condition: Good Disposition: Home  Diet recommendation: carb mod diet   Hospital Course:  72 yo female underwent umbilical hernia. Post op she was observed on the surgical floor. She ambulated well and tolerated a diet and was discharged home. She had multiple hyperglycemic readings.  Discharge Instructions  Discharge Instructions     Call MD for:  difficulty breathing, headache or visual disturbances   Complete by: As directed    Call MD for:  hives   Complete by: As directed    Call MD for:  persistant nausea and vomiting   Complete by: As directed    Call MD for:  redness, tenderness, or signs of infection (pain, swelling, redness, odor or green/yellow discharge around incision site)   Complete by: As directed    Call MD for:  severe uncontrolled pain   Complete by: As directed    Call MD for:  temperature >100.4   Complete by: As directed    Diet - low sodium heart healthy   Complete by: As directed    Discharge wound care:   Complete by: As directed    Ok to shower tomorrow. Glue will likely peel off in 1-3 weeks. No bandage required   Driving Restrictions   Complete by: As directed    No driving while on narcotics   Increase activity slowly   Complete by: As directed    Lifting restrictions    Complete by: As directed    No lifting greater than 20 pounds for 3 weeks      Allergies as of 10/21/2022       Reactions   Benzonatate Anaphylaxis, Hives, Other (See Comments)   Fluticasone Furoate-vilanterol Anaphylaxis, Hives   Other reaction(s): hives breo ellipta   Iodine Hives, Swelling, Other (See Comments)   And Betadine/caused hives and swelling   Latex Hives, Itching, Rash, Other (See Comments)   Burning   Simvastatin Other (See Comments)   Caused neuropathy in arms Neuropathy in arms   Bupropion Other (See Comments)   Negative thoughts. Crazy thoughts   Levofloxacin Other (See Comments)   Joint aches//leg, shoulder pain Joint pain   Penicillins Hives   Povidone-iodine Hives   Amoxicillin Hives   Blisters Did it involve swelling of the face/tongue/throat, SOB, or low BP? Yes Did it involve sudden or severe rash/hives, skin peeling, or any reaction on the inside of your mouth or nose? Yes Did you need to seek medical attention at a hospital or doctor's office?Unknown When did it last happen? 2019      If all above answers are "NO", may proceed with cephalosporin use.   Chlorthalidone Other (See Comments)   Ears ringing    Diflucan [fluconazole] Hives, Other (See Comments)   blister   Povidone Iodine    Amoxicillin-pot Clavulanate Rash   Doxycycline Itching, Diarrhea  Medication List     TAKE these medications    albuterol 108 (90 Base) MCG/ACT inhaler Commonly known as: VENTOLIN HFA Inhale 1-2 puffs into the lungs every 6 (six) hours as needed (Cough).   amLODipine 10 MG tablet Commonly known as: NORVASC Take 10 mg by mouth daily.   aspirin EC 81 MG tablet Take 1 tablet (81 mg total) by mouth daily. Swallow whole.   azelastine 0.1 % nasal spray Commonly known as: ASTELIN Place 1 spray into both nostrils 2 (two) times daily. Use in each nostril as directed   b complex vitamins capsule Take 1 capsule by mouth daily.   BD Pen Needle Nano  2nd Gen 32G X 4 MM Misc Generic drug: Insulin Pen Needle Use as directed 6 times daily   BD Pen Needle Micro U/F 32G X 6 MM Misc Generic drug: Insulin Pen Needle Use as directed 6 times daily   CALCIUM PO Take 1,000 mg by mouth every morning. 500 mg each   cephALEXin 500 MG capsule Commonly known as: KEFLEX Take 2 capsules (1,000 mg total) by mouth as directed 1 hour prior to dental work.   cetirizine 10 MG tablet Commonly known as: ZYRTEC Take 10 mg by mouth daily.   Co Q-10 100 MG Caps Take 100 mg by mouth daily.   diclofenac sodium 1 % Gel Commonly known as: VOLTAREN Apply 2 g topically 2 (two) times daily as needed (knee pain/ foot /Triger finger).   fluticasone 50 MCG/ACT nasal spray Commonly known as: FLONASE Place 2 sprays into both nostrils in the morning. What changed: when to take this   FreeStyle Libre 2 Sensor Misc Replace every 14 days and use to check blood sugar continuously topically change every 14 days to monitor blood glucose continuously for 84 days   gabapentin 300 MG capsule Commonly known as: NEURONTIN Take 1 capsule by mouth twice a day What changed:  how much to take how to take this when to take this   glucose blood test strip Use to test blood sugar 3 times daily as directed   hydrALAZINE 25 MG tablet Commonly known as: APRESOLINE Take 1 tablet (25 mg total) by mouth 2 (two) times daily.   irbesartan 300 MG tablet Commonly known as: AVAPRO Take 1 tablet (300 mg total) by mouth in the morning.   LORazepam 0.5 MG tablet Commonly known as: ATIVAN Take 1-2 tablets (0.5-1 mg total) by mouth once as needed for up to 1 dose (prior to MRI or CT scan for claustrophobia).   metFORMIN 500 MG 24 hr tablet Commonly known as: GLUCOPHAGE-XR Take 2 tablets (1,000 mg total) by mouth 2 (two) times daily.   mometasone-formoterol 100-5 MCG/ACT Aero Commonly known as: DULERA Take 2 puffs first thing in am and then another 2 puffs about 12 hours  later.   NovoLOG FlexPen 100 UNIT/ML FlexPen Generic drug: insulin aspart Inject 30 Units into the skin 3 (three) times daily with meals.   NovoLOG FlexPen 100 UNIT/ML FlexPen Generic drug: insulin aspart Inject 30 Units into the skin 3 (three) times daily with meals.   omeprazole 40 MG capsule Commonly known as: PRILOSEC Take 1 capsule by mouth twice daily   ondansetron 4 MG disintegrating tablet Commonly known as: ZOFRAN-ODT Take 1 tablet (4 mg total) by mouth every 8 (eight) hours as needed for nausea or vomiting.   OneTouch Delica Plus 0000000 Misc Use to test blood sugar 3 times daily   oxyCODONE 5 MG immediate release  tablet Commonly known as: Oxy IR/ROXICODONE Take 1 tablet (5 mg total) by mouth every 6 (six) hours as needed for severe pain.   REFRESH OPTIVE PF OP Place 1 drop into both eyes daily as needed (dry eyes). Non-pres   rosuvastatin 10 MG tablet Commonly known as: CRESTOR Take 1 tablet (10 mg total) by mouth daily. What changed: when to take this   sertraline 50 MG tablet Commonly known as: ZOLOFT Take 1 tablet (50 mg total) by mouth daily. What changed:  how much to take when to take this   spironolactone 50 MG tablet Commonly known as: ALDACTONE Take 1 tablet (50 mg total) by mouth daily.   terconazole 0.8 % vaginal cream Commonly known as: TERAZOL 3 Place 1 applicator vaginally daily as needed (yeast infections).   Tyler Aas FlexTouch 200 UNIT/ML FlexTouch Pen Generic drug: insulin degludec Inject 80 Units into the skin daily.               Discharge Care Instructions  (From admission, onward)           Start     Ordered   10/21/22 0000  Discharge wound care:       Comments: Ok to shower tomorrow. Glue will likely peel off in 1-3 weeks. No bandage required   10/21/22 0741            Follow-up Information     Rosalba Totty, Janice Bruce, MD Follow up on 11/08/2022.   Specialty: General Surgery Contact information: [id number] [place] [address] [phone]                  The results of significant diagnostics from this hospitalization (including imaging, microbiology, ancillary and laboratory) are listed below for reference.    Significant Diagnostic Studies: MM 3D SCREEN BREAST BILATERAL  Result Date: 10/18/2022 CLINICAL DATA:  Screening. Known chronic lymphocytic leukemia. EXAM: DIGITAL SCREENING BILATERAL MAMMOGRAM WITH TOMOSYNTHESIS AND CAD TECHNIQUE: Bilateral screening digital craniocaudal and mediolateral oblique mammograms were obtained. Bilateral screening digital breast tomosynthesis was performed. The images were evaluated with computer-aided detection. COMPARISON:  Previous exam(s). ACR Breast Density Category b: There are scattered areas of fibroglandular density. FINDINGS: There are no findings suspicious for malignancy. IMPRESSION: No mammographic evidence of malignancy. A result letter of this screening mammogram will be mailed directly to the patient. RECOMMENDATION: Screening mammogram in one year. (Code:SM-B-01Y) BI-RADS CATEGORY  1: Negative. Electronically Signed   By: Kristopher Oppenheim M.D.   On: 10/18/2022 10:24   Labs: Basic Metabolic Panel: Recent Labs  Lab 10/20/22 1148  CREATININE 0.81   Liver Function Tests: No results for input(s): "AST", "ALT", "ALKPHOS", "BILITOT", "PROT", "ALBUMIN" in the last 168 hours.  CBC: Recent Labs  Lab 10/20/22 1148  WBC 56.9*  HGB 10.5*  HCT 34.9*  MCV 92.8  PLT 108*    CBG: Recent Labs  Lab 10/20/22 0859 10/20/22 1138 10/20/22 1625 10/20/22 2035 10/21/22 0736  GLUCAP 177* 209* 296* 321* 153*    Principal Problem:   Umbilical hernia   Time coordinating discharge: 15 min

## 2022-10-25 ENCOUNTER — Other Ambulatory Visit: Payer: Self-pay

## 2022-10-26 ENCOUNTER — Other Ambulatory Visit: Payer: Self-pay

## 2022-10-26 ENCOUNTER — Other Ambulatory Visit (HOSPITAL_COMMUNITY): Payer: Self-pay

## 2022-10-26 MED ORDER — SERTRALINE HCL 50 MG PO TABS
25.0000 mg | ORAL_TABLET | Freq: Every day | ORAL | 3 refills | Status: DC
  Filled 2022-10-26: qty 45, 90d supply, fill #0

## 2022-10-26 MED ORDER — TRESIBA FLEXTOUCH 200 UNIT/ML ~~LOC~~ SOPN
80.0000 [IU] | PEN_INJECTOR | Freq: Every day | SUBCUTANEOUS | 11 refills | Status: DC
  Filled 2022-10-26 – 2022-10-27 (×2): qty 30, 77d supply, fill #0
  Filled 2022-10-28: qty 27, 68d supply, fill #0
  Filled 2023-01-13: qty 27, 68d supply, fill #1
  Filled 2023-03-20: qty 27, 68d supply, fill #2
  Filled 2023-05-18 – 2023-05-28 (×2): qty 27, 68d supply, fill #3
  Filled 2023-07-26: qty 27, 68d supply, fill #4
  Filled 2023-10-09: qty 27, 68d supply, fill #5

## 2022-10-26 MED ORDER — AMLODIPINE BESYLATE 10 MG PO TABS
10.0000 mg | ORAL_TABLET | Freq: Every day | ORAL | 3 refills | Status: DC
  Filled 2022-10-26: qty 90, 90d supply, fill #0
  Filled 2023-01-20: qty 90, 90d supply, fill #1
  Filled 2023-04-20: qty 90, 90d supply, fill #2
  Filled 2023-07-24: qty 90, 90d supply, fill #3

## 2022-10-27 ENCOUNTER — Other Ambulatory Visit: Payer: Self-pay

## 2022-10-28 ENCOUNTER — Other Ambulatory Visit (HOSPITAL_BASED_OUTPATIENT_CLINIC_OR_DEPARTMENT_OTHER): Payer: Self-pay

## 2022-10-28 ENCOUNTER — Other Ambulatory Visit: Payer: Self-pay

## 2022-10-28 ENCOUNTER — Other Ambulatory Visit (HOSPITAL_COMMUNITY): Payer: Self-pay

## 2022-11-01 ENCOUNTER — Other Ambulatory Visit: Payer: Self-pay | Admitting: Hematology

## 2022-11-01 ENCOUNTER — Other Ambulatory Visit (HOSPITAL_COMMUNITY): Payer: Self-pay

## 2022-11-02 ENCOUNTER — Other Ambulatory Visit: Payer: Self-pay | Admitting: Physician Assistant

## 2022-11-02 ENCOUNTER — Other Ambulatory Visit: Payer: Self-pay | Admitting: Hematology

## 2022-11-02 ENCOUNTER — Inpatient Hospital Stay: Payer: PPO | Attending: Hematology

## 2022-11-02 VITALS — BP 118/58 | HR 72 | Temp 98.2°F | Resp 18 | Wt 230.0 lb

## 2022-11-02 DIAGNOSIS — K7581 Nonalcoholic steatohepatitis (NASH): Secondary | ICD-10-CM | POA: Diagnosis not present

## 2022-11-02 DIAGNOSIS — K7460 Unspecified cirrhosis of liver: Secondary | ICD-10-CM | POA: Diagnosis not present

## 2022-11-02 DIAGNOSIS — D696 Thrombocytopenia, unspecified: Secondary | ICD-10-CM | POA: Diagnosis not present

## 2022-11-02 DIAGNOSIS — D509 Iron deficiency anemia, unspecified: Secondary | ICD-10-CM | POA: Insufficient documentation

## 2022-11-02 DIAGNOSIS — R162 Hepatomegaly with splenomegaly, not elsewhere classified: Secondary | ICD-10-CM | POA: Diagnosis not present

## 2022-11-02 DIAGNOSIS — R61 Generalized hyperhidrosis: Secondary | ICD-10-CM | POA: Insufficient documentation

## 2022-11-02 DIAGNOSIS — C8300 Small cell B-cell lymphoma, unspecified site: Secondary | ICD-10-CM

## 2022-11-02 DIAGNOSIS — D801 Nonfamilial hypogammaglobulinemia: Secondary | ICD-10-CM

## 2022-11-02 DIAGNOSIS — C9110 Chronic lymphocytic leukemia of B-cell type not having achieved remission: Secondary | ICD-10-CM | POA: Diagnosis not present

## 2022-11-02 MED ORDER — DEXTROSE 5 % IV SOLN: Freq: Once | INTRAVENOUS | Status: AC

## 2022-11-02 MED ORDER — ACETAMINOPHEN 325 MG PO TABS
650.0000 mg | ORAL_TABLET | Freq: Once | ORAL | Status: AC
  Administered 2022-11-02: 650 mg via ORAL
  Filled 2022-11-02: qty 2

## 2022-11-02 MED ORDER — IMMUNE GLOBULIN (HUMAN) 10 GM/100ML IV SOLN
400.0000 mg/kg | INTRAVENOUS | Status: DC
  Administered 2022-11-02: 40 g via INTRAVENOUS
  Filled 2022-11-02: qty 400

## 2022-11-02 MED ORDER — DIPHENHYDRAMINE HCL 25 MG PO CAPS
25.0000 mg | ORAL_CAPSULE | Freq: Once | ORAL | Status: AC
  Administered 2022-11-02: 25 mg via ORAL
  Filled 2022-11-02: qty 1

## 2022-11-02 MED ORDER — METHYLPREDNISOLONE SODIUM SUCC 40 MG IJ SOLR
40.0000 mg | Freq: Once | INTRAMUSCULAR | Status: AC
  Administered 2022-11-02: 40 mg via INTRAVENOUS
  Filled 2022-11-02: qty 1

## 2022-11-02 NOTE — Progress Notes (Signed)
Patient waited 30 minute post IVG treatment with no issue. VSS upon discharge.

## 2022-11-08 ENCOUNTER — Other Ambulatory Visit: Payer: Self-pay

## 2022-11-08 DIAGNOSIS — K429 Umbilical hernia without obstruction or gangrene: Secondary | ICD-10-CM | POA: Diagnosis not present

## 2022-11-08 DIAGNOSIS — K7581 Nonalcoholic steatohepatitis (NASH): Secondary | ICD-10-CM | POA: Diagnosis not present

## 2022-11-08 DIAGNOSIS — M542 Cervicalgia: Secondary | ICD-10-CM | POA: Diagnosis not present

## 2022-11-08 NOTE — Telephone Encounter (Signed)
Symbicort 80 Take 2 puffs first thing in am and then another 2 puffs about 12 hours later.

## 2022-11-09 ENCOUNTER — Telehealth: Payer: Self-pay

## 2022-11-09 ENCOUNTER — Other Ambulatory Visit (HOSPITAL_COMMUNITY): Payer: Self-pay

## 2022-11-09 NOTE — Telephone Encounter (Addendum)
Patient Advocate Encounter   Received notification from [place] [employer] that prior authorization is required for Budesonide-Formoterol Fumarate 80-4.5MCG/ACT aerosol  Submitted: 11-09-2022 Key [id number]   Status is pending  A transitional fill has been approved until authorization is granted. Co-pay is $100

## 2022-11-12 ENCOUNTER — Other Ambulatory Visit (HOSPITAL_COMMUNITY): Payer: Self-pay

## 2022-11-14 ENCOUNTER — Telehealth: Payer: Self-pay | Admitting: Internal Medicine

## 2022-11-14 NOTE — Telephone Encounter (Signed)
Pt called in bc she is trying to figure out what is going on with the PA for Symbicort, informed her that the status was pending but she does have a a transition fill that she trying to obtain for the meantime, pls advise.

## 2022-11-14 NOTE — Telephone Encounter (Signed)
Do we have an update on patients prior authorization?  Please route message back to triage

## 2022-11-15 ENCOUNTER — Other Ambulatory Visit: Payer: Self-pay | Admitting: Internal Medicine

## 2022-11-15 ENCOUNTER — Other Ambulatory Visit (HOSPITAL_COMMUNITY): Payer: Self-pay

## 2022-11-15 DIAGNOSIS — R053 Chronic cough: Secondary | ICD-10-CM | POA: Diagnosis not present

## 2022-11-15 DIAGNOSIS — R490 Dysphonia: Secondary | ICD-10-CM | POA: Diagnosis not present

## 2022-11-15 DIAGNOSIS — J385 Laryngeal spasm: Secondary | ICD-10-CM | POA: Diagnosis not present

## 2022-11-15 DIAGNOSIS — R0989 Other specified symptoms and signs involving the circulatory and respiratory systems: Secondary | ICD-10-CM | POA: Diagnosis not present

## 2022-11-15 NOTE — Telephone Encounter (Signed)
PA is still pending determination, there was an issue with processing the original request; it has been resubmitted.

## 2022-11-15 NOTE — Telephone Encounter (Signed)
Issue with original request, resubmitted and pending determination.  Key: [id number]

## 2022-11-15 NOTE — Telephone Encounter (Signed)
Ins Co will send an RX advance that must be filled w/i 14 days for Symbicort. The fax will arrive w/I 72 hours.

## 2022-11-16 ENCOUNTER — Other Ambulatory Visit: Payer: Self-pay

## 2022-11-16 ENCOUNTER — Other Ambulatory Visit (HOSPITAL_COMMUNITY): Payer: Self-pay

## 2022-11-16 DIAGNOSIS — J385 Laryngeal spasm: Secondary | ICD-10-CM | POA: Diagnosis not present

## 2022-11-16 DIAGNOSIS — R0989 Other specified symptoms and signs involving the circulatory and respiratory systems: Secondary | ICD-10-CM | POA: Diagnosis not present

## 2022-11-16 DIAGNOSIS — R490 Dysphonia: Secondary | ICD-10-CM | POA: Diagnosis not present

## 2022-11-16 MED ORDER — BUDESONIDE-FORMOTEROL FUMARATE 80-4.5 MCG/ACT IN AERO
2.0000 | INHALATION_SPRAY | Freq: Every day | RESPIRATORY_TRACT | 12 refills | Status: DC
  Filled 2022-11-16: qty 10.2, 30d supply, fill #0
  Filled 2022-12-21: qty 10.2, 30d supply, fill #1
  Filled 2023-02-17: qty 10.2, 30d supply, fill #2
  Filled 2023-03-20: qty 10.2, 30d supply, fill #3
  Filled 2023-05-11: qty 10.2, 30d supply, fill #4
  Filled 2023-06-12: qty 10.2, 30d supply, fill #5
  Filled 2023-07-08: qty 10.2, 30d supply, fill #6
  Filled 2023-08-06: qty 10.2, 30d supply, fill #7
  Filled 2023-09-05: qty 10.2, 30d supply, fill #8
  Filled 2023-11-11: qty 10.2, 30d supply, fill #9

## 2022-11-16 NOTE — Telephone Encounter (Signed)
PA has been APPROVED from 11/15/2022-09/12/2023

## 2022-11-16 NOTE — Telephone Encounter (Signed)
Called and spoke with patient. Patient verbalized understanding. Advised patient I would send in symbicort to her pharmacy. Patient verified pharmacy. Rx has been sent.   Nothing further needed.

## 2022-11-17 ENCOUNTER — Other Ambulatory Visit (HOSPITAL_COMMUNITY): Payer: Self-pay

## 2022-11-18 ENCOUNTER — Other Ambulatory Visit: Payer: Self-pay

## 2022-11-19 ENCOUNTER — Other Ambulatory Visit (HOSPITAL_COMMUNITY): Payer: Self-pay

## 2022-11-22 ENCOUNTER — Other Ambulatory Visit (HOSPITAL_COMMUNITY): Payer: Self-pay

## 2022-11-22 ENCOUNTER — Other Ambulatory Visit: Payer: Self-pay

## 2022-11-22 DIAGNOSIS — K429 Umbilical hernia without obstruction or gangrene: Secondary | ICD-10-CM | POA: Diagnosis not present

## 2022-11-22 MED ORDER — DESOXIMETASONE 0.25 % EX CREA
TOPICAL_CREAM | CUTANEOUS | 0 refills | Status: AC
  Filled 2022-11-22: qty 60, 30d supply, fill #0

## 2022-11-23 ENCOUNTER — Other Ambulatory Visit (HOSPITAL_COMMUNITY): Payer: Self-pay

## 2022-11-23 ENCOUNTER — Other Ambulatory Visit: Payer: Self-pay

## 2022-11-24 ENCOUNTER — Other Ambulatory Visit (HOSPITAL_COMMUNITY): Payer: Self-pay

## 2022-11-24 DIAGNOSIS — K429 Umbilical hernia without obstruction or gangrene: Secondary | ICD-10-CM | POA: Diagnosis not present

## 2022-11-24 DIAGNOSIS — R188 Other ascites: Secondary | ICD-10-CM | POA: Diagnosis not present

## 2022-11-24 DIAGNOSIS — K7460 Unspecified cirrhosis of liver: Secondary | ICD-10-CM | POA: Diagnosis not present

## 2022-11-24 DIAGNOSIS — I10 Essential (primary) hypertension: Secondary | ICD-10-CM | POA: Diagnosis not present

## 2022-11-24 DIAGNOSIS — C8300 Small cell B-cell lymphoma, unspecified site: Secondary | ICD-10-CM | POA: Diagnosis not present

## 2022-11-24 DIAGNOSIS — G629 Polyneuropathy, unspecified: Secondary | ICD-10-CM | POA: Diagnosis not present

## 2022-11-24 DIAGNOSIS — E119 Type 2 diabetes mellitus without complications: Secondary | ICD-10-CM | POA: Diagnosis not present

## 2022-11-24 DIAGNOSIS — D509 Iron deficiency anemia, unspecified: Secondary | ICD-10-CM | POA: Diagnosis not present

## 2022-11-24 DIAGNOSIS — K760 Fatty (change of) liver, not elsewhere classified: Secondary | ICD-10-CM | POA: Diagnosis not present

## 2022-11-24 DIAGNOSIS — R221 Localized swelling, mass and lump, neck: Secondary | ICD-10-CM | POA: Diagnosis not present

## 2022-11-24 MED ORDER — AZITHROMYCIN 250 MG PO TABS
ORAL_TABLET | ORAL | 0 refills | Status: AC
  Filled 2022-11-24 – 2022-11-25 (×2): qty 6, 5d supply, fill #0

## 2022-11-25 ENCOUNTER — Encounter: Payer: Self-pay | Admitting: Pharmacist

## 2022-11-25 ENCOUNTER — Other Ambulatory Visit: Payer: Self-pay

## 2022-11-25 ENCOUNTER — Other Ambulatory Visit (HOSPITAL_COMMUNITY): Payer: Self-pay

## 2022-11-28 ENCOUNTER — Telehealth: Payer: Self-pay | Admitting: Hematology

## 2022-11-28 NOTE — Telephone Encounter (Signed)
Called patient per 3/18 IB message to reschedule IVIG infusion for another day. Patient rescheduled and notified.

## 2022-11-29 ENCOUNTER — Encounter: Payer: Self-pay | Admitting: Internal Medicine

## 2022-11-29 ENCOUNTER — Ambulatory Visit: Payer: PPO | Admitting: Internal Medicine

## 2022-11-29 VITALS — BP 120/68 | HR 74 | Temp 97.7°F | Ht 64.5 in | Wt 231.6 lb

## 2022-11-29 DIAGNOSIS — R058 Other specified cough: Secondary | ICD-10-CM | POA: Diagnosis not present

## 2022-11-29 DIAGNOSIS — J45991 Cough variant asthma: Secondary | ICD-10-CM | POA: Diagnosis not present

## 2022-11-29 DIAGNOSIS — R0609 Other forms of dyspnea: Secondary | ICD-10-CM | POA: Diagnosis not present

## 2022-11-29 NOTE — Assessment & Plan Note (Addendum)
Post op feb 8th with assoc wt gain   Feels her hgb is down and declined walking study so rec  Re SABA :  I spent extra time with pt today reviewing appropriate use of albuterol for prn use on exertion with the following points: 1) saba is for relief of sob that does not improve by walking a slower pace or resting but rather if the pt does not improve after trying this first. 2) If the pt is convinced, as many are, that saba helps recover from activity faster then it's easy to tell if this is the case by re-challenging : ie stop, take the inhaler, then p 5 minutes try the exact same activity (intensity of workload) that just caused the symptoms and see if they are substantially diminished or not after saba 3) if there is an activity that reproducibly causes the symptoms, try the saba 15 min before the activity on alternate days   If in fact the saba really does help, then fine to continue to use it prn but advised may need to look closer at the maintenance regimen being used to achieve better control of airways disease with exertion.   Check labs as planned  Make sure you check your oxygen saturation at your highest level of activity(NOT after you stop)  to be sure it stays over 90% and keep track of it at least once a week, more often if breathing getting worse, and let me know if losing ground. (Collect the dots to connect the dots approach)        Each maintenance medication was reviewed in detail including emphasizing most importantly the difference between maintenance and prns and under what circumstances the prns are to be triggered using an action plan format where appropriate.  Total time for H and P, chart review, counseling, reviewing hfa device(s) and generating customized AVS unique to this office visit / same day charting > 30 min for multiple  refractory respiratory  symptoms of uncertain etiology

## 2022-11-29 NOTE — Progress Notes (Signed)
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 rec dysmista / zyrtec referred to pulmonary clinic 04/29/2021 by Dr   Janice [name] for cough/wheeze with prn use of albuterol x years but rarely used it until around March 12 2021    History of Present Illness  04/29/2021  Pulmonary/ 1st office eval/Janice [name]  Chief Complaint  Patient presents with   Consult    Cough and wheezing for 3 weeks  Onset July 27th 2022 lots of nasal congestion/ drainage turned slt yellow while maint on flonase and clariton rx zpak > worse with cough/ wheeze rx prednisone > nebulizer  in office helped some > but still cough to point of gagging despite breztri 04/22/21 Dyspnea:  not limiting but not longer working out on ex bike Cough: minimal  Sleep: cpap / at least once a night wakes up cough  SABA use: last used 5 h  prior Finishing omnicef / no longer purulent secretions at this point  Rec Stop breztri Take omeprazole 40 mg Take 30- 60 min before your first and last meals of the day  add gabapentin 300 mg each am until the cough is gone for at least a week  GERD diet reviewed, bed blocks rec  Only use your albuterol as a rescue medication Take delsym two tsp every 12 hours and supplement if needed with  Tylenol #3   up to 1-2 every 4 hours to suppress the urge to cough. Swallowing water and/or using ice chips/non mint and menthol containing candies (such as lifesavers or sugarless jolly ranchers) are also effective.  You should rest your voice and avoid activities that you know make you cough. Once you have eliminated the cough for 3 straight days try reducing the Tylenol #3 first,  then the delsym as tolerated.    Stop clariton  Dymista (takes the place of flonase)  twice daily should eliminate the drippy nose (if too expensive ok  For drainage / throat tickle try take CHLORPHENIRAMINE  4 mg  (Chlortab '4mg'$   at [employer] should be easiest to find in the green box)  take one every 4 hours as needed - available over the counter- may cause drowsiness so start with just a dose or two an hour before bedtime and see how you tolerate it before trying in daytime )  Allergy profile 04/29/2021 >  Eos 0.1 /  IgE  6 Depomedrol 120  mg IM today Please schedule a follow up office visit in 4 weeks, sooner if needed    05/27/2021  f/u ov/Janice [name] re: cough x 2015 c/w uacs maint on prilosec 40 mg bid  and gabapentin 300 mg with supper - could not tol 300 mg in am  Dyspnea:  one block takes about 23 min and stops a couple of times due to sob but overall improving with regular walking  Cough: still some throat clearing  Sleeping: cpap/ risers and sleeping fine  SABA use: none  02: none  Covid status:   vax x 3   Rec Flu shot today  No change in medications except add gabapentin 100 mg each am - ok to adjust up to a  maximum of 300 mg three times daily  Please schedule a follow up visit in 3 months but call sooner if needed or let me refer you to DR Janice [name] at [facility] for irritable larynx syndrome> did not seek referral   07/20/22 cough so bad could not catch her breath so went to er with nl exam, nl CT neck  08/23/2022  f/u ov/Janice [name] re: UACS  maint on astelin and flonase and  300 mg x 2 hs  Chief Complaint  Patient presents with   Follow-up    Pt states she has been very anemic since LOV.    Dyspnea:  no longer walking around block Cough: dry hack to point of choking "but it's better than it was"   Sleeping: on risers  SABA use: seems to help though took it w/in 2 h ov ov and still having coughing fits in office 02: none  Rec Try generic symbicort 80 (or dulera 100)   Take 2 puffs first thing in am and then  another 2 puffs about 12 hours later.  Work on inhaler technique:   Only use your albuterol as a rescue medication  My office will be contacting you by phone for referral to voice center at [facility] > March 2024    Depomedrol 120 mg IM    10/17/2022  f/u ov/Janice [name] re: UACS maint off symbicort x 2 weeks and having more cough since ran out  Chief Complaint  Patient presents with   Follow-up    Dx anemia.  Has dyspnea. IV iron tx.    Dyspnea:  more doe attributes to anemia but not clear whether corrlates with symbicort and last hgb 10  Cough: onset 10/15/22 daytime >  noct / non-productive / urge to clear throat. Sleeping: on risers cough has recurred noct as well  SABA use: none  02: none  Covid status:   4 total vax Rec Dulera 100 Take 2 puffs first thing in am and then another 2 puffs about 12 hours later. Blow out thru [employer] if insurance not covering  For itching/sneezing runny nose Zyrtec 10 mg one daily as needed Keep your follow up appt :  with all medications /inhalers/ solutions in hand   Feb 8th 2024 open hernia repair did fine but put on 10 lbs    11/29/2022  f/u ov/Janice [name] re: uacs vs upper airway cough maint on symbicort 80 2 in am and 1 in pm   Chief Complaint  Patient presents with   Follow-up    SOB x 1 week.  Cough has improved.  Dyspnea:  walking to mb 60 ft slt incline to box s stopping/  Cough: better but still wakes most nights around 3 am  Sleeping: bed risers/ one pillow  SABA use: rarely  02:  none      No obvious day to day or daytime variability or assoc excess/ purulent sputum or mucus plugs or hemoptysis or cp or chest tightness, subjective wheeze or overt sinus or hb symptoms.     Also denies any obvious fluctuation of symptoms with weather or environmental changes or other aggravating or alleviating factors except as outlined above   No unusual exposure hx or h/o childhood pna/ asthma or knowledge of premature birth.  Current Allergies, Complete Past  Medical History, Past Surgical History, Family History, and Social History were reviewed in [employer] record.  ROS  The following are not active complaints unless bolded Hoarseness, sore throat, dysphagia, dental problems,  itching, sneezing,  nasal congestion or discharge of excess mucus or purulent secretions, ear ache,   fever, chills, sweats, unintended wt loss or wt gain, classically pleuritic or exertional cp,  orthopnea pnd or arm/hand swelling  or leg swelling, presyncope, palpitations, abdominal pain, anorexia, nausea, vomiting, diarrhea  or change in bowel habits or change in bladder habits, change in stools or change in urine, dysuria, hematuria,  rash, arthralgias, visual complaints, headache, numbness, weakness or ataxia or problems with walking or coordination,  change in mood or  memory.        Current Meds  Medication Sig   albuterol (VENTOLIN HFA) 108 (90 Base) MCG/ACT inhaler Inhale 1-2 puffs into the lungs every 6 (six) hours as needed (Cough).   amLODipine (NORVASC) 10 MG tablet Take 1 tablet (10 mg total) by mouth daily.   aspirin EC 81 MG tablet Take 1 tablet (81 mg total) by mouth daily. Swallow whole.   azelastine (ASTELIN) 0.1 % nasal spray Place 1 spray into both nostrils 2 (two) times daily. Use in each nostril as directed   azithromycin (ZITHROMAX) 250 MG tablet Take 2 tablets (500 mg total) by mouth daily for 1 day, THEN 1 tablet (250 mg total) daily for 4 days.   b complex vitamins capsule Take 1 capsule by mouth daily.   budesonide-formoterol (SYMBICORT) 80-4.5 MCG/ACT inhaler Inhale 2 puffs into the lungs daily. (Patient taking differently: Inhale 2 puffs into the lungs 2 (two) times daily. Patient usually does 2 puffs in morning and 1 puff in evening.)   CALCIUM PO Take 1,000 mg by mouth every morning. 500 mg each   Carboxymethylcellul-Glycerin (REFRESH OPTIVE PF OP) Place 1 drop into both eyes daily as needed (dry eyes). Non-pres   cephALEXin  (KEFLEX) 500 MG capsule Take 2 capsules (1,000 mg total) by mouth as directed 1 hour prior to dental work.   cetirizine (ZYRTEC) 10 MG tablet Take 10 mg by mouth daily.   Coenzyme Q10 (CO Q-10) 100 MG CAPS Take 100 mg by mouth daily.   Continuous Blood Gluc Sensor (FREESTYLE LIBRE 2 SENSOR) MISC Replace every 14 days and use to check blood sugar continuously topically change every 14 days to monitor blood glucose continuously for 84 days   desoximetasone (TOPICORT) 0.25 % cream Apply a small amount to skin twice a day as needed for flares   diclofenac sodium (VOLTAREN) 1 % GEL Apply 2 g topically 2 (two) times daily as needed (knee pain/ foot /Triger finger).   fluticasone (FLONASE) 50 MCG/ACT nasal spray Place 2 sprays into both nostrils in the morning. (Patient taking differently: Place 2 sprays into both nostrils 2 (two) times daily.)   gabapentin (NEURONTIN) 300 MG capsule Take 1 capsule by mouth twice a day (Patient taking differently: Take 600 mg by mouth at bedtime.)   glucose blood test strip Use to test blood sugar 3 times daily as directed   hydrALAZINE (APRESOLINE) 25 MG tablet Take 1 tablet (25 mg total) by mouth 2 (two) times daily.   insulin aspart (NOVOLOG FLEXPEN) 100 UNIT/ML FlexPen Inject 30 Units into the skin 3 (three) times daily with meals.   insulin degludec (TRESIBA FLEXTOUCH) 200 UNIT/ML FlexTouch Pen Inject 80 Units into the skin daily.   Insulin Pen Needle (BD PEN NEEDLE MICRO U/F) 32G X 6 MM MISC Use as directed 6 times daily   Insulin Pen Needle (BD PEN NEEDLE NANO 2ND GEN) 32G X 4 MM MISC Use as directed 6 times daily  irbesartan (AVAPRO) 300 MG tablet Take 1 tablet (300 mg total) by mouth in the morning.   Lancets (ONETOUCH DELICA PLUS 123XX123) MISC Use to test blood sugar 3 times daily   LORazepam (ATIVAN) 0.5 MG tablet Take 1-2 tablets (0.5-1 mg total) by mouth once as needed for up to 1 dose (prior to MRI or CT scan for claustrophobia).   metFORMIN  (GLUCOPHAGE-XR) 500 MG 24 hr tablet Take 2 tablets (1,000 mg total) by mouth 2 (two) times daily.        omeprazole (PRILOSEC) 40 MG capsule Take 1 capsule by mouth twice daily   ondansetron (ZOFRAN-ODT) 4 MG disintegrating tablet Take 1 tablet (4 mg total) by mouth every 8 (eight) hours as needed for nausea or vomiting.   rosuvastatin (CRESTOR) 10 MG tablet Take 1 tablet (10 mg total) by mouth daily. (Patient taking differently: Take 10 mg by mouth at bedtime.)   sertraline (ZOLOFT) 50 MG tablet Take 1 tablet (50 mg total) by mouth daily. (Patient taking differently: Take 25 mg by mouth every evening.)   spironolactone (ALDACTONE) 50 MG tablet Take 1 tablet (50 mg total) by mouth daily.   terconazole (TERAZOL 3) 0.8 % vaginal cream Place 1 applicator vaginally daily as needed (yeast infections).                    Past Medical History:  Diagnosis Date   Allergy    Asthma    seasonal   Cellulitis 2013   Left toe   Cirrhosis ([place])    Common migraine    History of   Complication of anesthesia    Per pt/had breathing problems with "block" during rotator cuff surgery. 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   DDD (degenerative disc disease)    Depression    denies takes paxil for migraines   Diabetes mellitus    type 2   Diabetic peripheral neuropathy (HCC)    Diverticulitis    Diverticulosis    DJD (degenerative joint disease)    GERD (gastroesophageal reflux disease)    History of colon polyps    hyperplastic   History of gastric polyp    Hyperlipidemia    Hypertension    Iron deficiency anemia    Obesity    OSA on CPAP    cpap   Pneumonia    april 2020  mild   Primary localized osteoarthritis of left knee 03/27/2019   Sensorineural hearing loss        Objective:    wts  11/29/2022       231  10/17/2022         226  08/23/2022     224    05/27/21 221 lb 6.4 oz (100.4 kg)  05/18/21 225 lb (102.1 kg)  05/06/21 222 lb (100.7 kg)    Vital signs reviewed  11/29/2022   - Note at rest 02 sats  98% on RA   General appearance:    amb mod obese wf nad   HEENT : Oropharynx  clear         NECK :  without  apparent JVD/ palpable Nodes/TM    LUNGS: no acc muscle use,  Nl contour chest which is clear to A and P bilaterally without cough on insp or exp maneuvers   CV:  RRR  no s3 or murmur or increase in P2, and trace pitting both LEs  ABD:  mod obese but soft   MS:  Nl gait/ ext warm  without deformities Or obvious joint restrictions  calf tenderness, cyanosis or clubbing    SKIN: warm and dry without lesions    NEURO:  alert, approp, nl sensorium with  no motor or cerebellar deficits apparent.              Assessment

## 2022-11-29 NOTE — Patient Instructions (Signed)
Symbicort 80 1-2 every 12 hours  - always leave the dose higher for the full benefit then a slow wean   Only use your albuterol as a rescue medication to be used if you can't catch your breath by resting or doing a relaxed purse lip breathing pattern.  - The less you use it, the better it will work when you need it. - Ok to use up to 2 puffs  every 4 hours if you must but call for immediate appointment if use goes up over your usual need - Don't leave home without it !!  (think of it like the spare tire for your car)   For night time cough >  For drainage / throat tickle try take CHLORPHENIRAMINE  4 mg  ("Allergy Relief" 4mg   at [facility] should be easiest to find in the blue box usually on bottom shelf)  take one every 4 hours as needed - extremely effective and inexpensive over the counter- may cause drowsiness so start with just a dose an hour before bed   Please schedule a follow up visit in 3 months but call sooner if needed

## 2022-11-29 NOTE — Assessment & Plan Note (Signed)
Onset around 1975 with tendency to "sinus infections"  - 2015 [facility] eval stop acei and use dysmista  - flared July 2022 neg resp to rx for AB >  04/29/2021 d/c breztri/flonase - cyclical cough rx 0000000 >>>  - Allergy profile 04/29/2021 >  Eos 0.1 /  IgE  6 - 05/27/2021 added am dose of gabapentin 100 with rec to gradually build to max of 300 mg tid if tol or see Dr Joya Gaskins at [facility] > referred 08/23/2022  Improved p ST at [facility], only a problem now around 3 am which sounds more like asthma or pnds > see sep a/p

## 2022-11-29 NOTE — Assessment & Plan Note (Signed)
?   Onset in pt with h/o sinus problems and freq cough  Dating back at least to 2015 @ initial [facility] eval - Allergy profile 04/29/2021 > Eos 0.1 /  IgE  6 - 08/23/2022  After extensive coaching inhaler device,  effectiveness =    75% so try symb 80 2bid and depomedrol 120 mg IM  -  10/17/2022   try dulera 100 2bid for insurance > preferred symbicort  - - 11/29/2022  After extensive coaching inhaler device,  effectiveness =    75% (short Ti)     3 am cough may be due to asthma or pnds so rec take symbicort 80 x 2 at hs x one week and if not better add  hs 1st gen H1 blockers per guidelines

## 2022-11-30 ENCOUNTER — Other Ambulatory Visit (HOSPITAL_COMMUNITY): Payer: Self-pay

## 2022-12-01 ENCOUNTER — Other Ambulatory Visit: Payer: Self-pay

## 2022-12-01 ENCOUNTER — Other Ambulatory Visit (HOSPITAL_COMMUNITY): Payer: Self-pay

## 2022-12-01 DIAGNOSIS — D801 Nonfamilial hypogammaglobulinemia: Secondary | ICD-10-CM

## 2022-12-01 DIAGNOSIS — C8300 Small cell B-cell lymphoma, unspecified site: Secondary | ICD-10-CM

## 2022-12-02 ENCOUNTER — Ambulatory Visit: Payer: PPO

## 2022-12-02 ENCOUNTER — Inpatient Hospital Stay (HOSPITAL_BASED_OUTPATIENT_CLINIC_OR_DEPARTMENT_OTHER): Payer: PPO | Admitting: Hematology

## 2022-12-02 ENCOUNTER — Inpatient Hospital Stay: Payer: PPO | Attending: Hematology

## 2022-12-02 VITALS — BP 130/37 | HR 85 | Temp 98.1°F | Resp 20 | Wt 234.6 lb

## 2022-12-02 DIAGNOSIS — D801 Nonfamilial hypogammaglobulinemia: Secondary | ICD-10-CM | POA: Insufficient documentation

## 2022-12-02 DIAGNOSIS — C9110 Chronic lymphocytic leukemia of B-cell type not having achieved remission: Secondary | ICD-10-CM

## 2022-12-02 DIAGNOSIS — D508 Other iron deficiency anemias: Secondary | ICD-10-CM | POA: Diagnosis not present

## 2022-12-02 LAB — CMP (CANCER CENTER ONLY)
ALT: 21 U/L (ref 0–44)
AST: 21 U/L (ref 15–41)
Albumin: 4.3 g/dL (ref 3.5–5.0)
Alkaline Phosphatase: 71 U/L (ref 38–126)
Anion gap: 5 (ref 5–15)
BUN: 12 mg/dL (ref 8–23)
CO2: 26 mmol/L (ref 22–32)
Calcium: 8.9 mg/dL (ref 8.9–10.3)
Chloride: 107 mmol/L (ref 98–111)
Creatinine: 0.79 mg/dL (ref 0.44–1.00)
GFR, Estimated: >60 mL/min (ref >60)
Glucose, Bld: 232 mg/dL — ABNORMAL HIGH (ref 70–99)
Potassium: 4.5 mmol/L (ref 3.5–5.1)
Sodium: 138 mmol/L (ref 135–145)
Total Bilirubin: 0.4 mg/dL (ref 0.3–1.2)
Total Protein: 6.4 g/dL — ABNORMAL LOW (ref 6.5–8.1)

## 2022-12-02 LAB — SAMPLE TO BLOOD BANK

## 2022-12-02 LAB — CBC WITH DIFFERENTIAL (CANCER CENTER ONLY)
Abs Immature Granulocytes: 0.29 10*3/uL — ABNORMAL HIGH (ref 0.00–0.07)
Basophils Absolute: 0.1 10*3/uL (ref 0.0–0.1)
Basophils Relative: 0 %
Eosinophils Absolute: 0.2 10*3/uL (ref 0.0–0.5)
Eosinophils Relative: 1 %
HCT: 34.8 % — ABNORMAL LOW (ref 36.0–46.0)
Hemoglobin: 10.9 g/dL — ABNORMAL LOW (ref 12.0–15.0)
Immature Granulocytes: 1 %
Lymphocytes Relative: 80 %
Lymphs Abs: 33.7 10*3/uL — ABNORMAL HIGH (ref 0.7–4.0)
MCH: 29.4 pg (ref 26.0–34.0)
MCHC: 31.3 g/dL (ref 30.0–36.0)
MCV: 93.8 fL (ref 80.0–100.0)
Monocytes Absolute: 2.6 10*3/uL — ABNORMAL HIGH (ref 0.1–1.0)
Monocytes Relative: 6 %
Neutro Abs: 5.2 10*3/uL (ref 1.7–7.7)
Neutrophils Relative %: 12 %
Platelet Count: 112 10*3/uL — ABNORMAL LOW (ref 150–400)
RBC: 3.71 MIL/uL — ABNORMAL LOW (ref 3.87–5.11)
RDW: 16.9 % — ABNORMAL HIGH (ref 11.5–15.5)
WBC Count: 42 10*3/uL — ABNORMAL HIGH (ref 4.0–10.5)
nRBC: 0 % (ref 0.0–0.2)

## 2022-12-02 LAB — FERRITIN: Ferritin: 39 ng/mL (ref 11–307)

## 2022-12-02 LAB — LACTATE DEHYDROGENASE: LDH: 208 U/L — ABNORMAL HIGH (ref 98–192)

## 2022-12-02 NOTE — Progress Notes (Signed)
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  INTERVAL HISTORY:  Janice [name] is a 72 y.o. 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 Patient was last seen by me on 09/29/2022 and complained of recurrent sinopulmonary infections. Patient did undergo an umbilical hernia repair [id number].  Today, she reports that following her umbilical hernia repair, she did endorse significant drainage from the surgical site. Patient does note that she did have a hematoma above her surgical site. She continues to have an open wound, and uses saline 2-3 times day. She does report that she has gained 10 pounds since surgery and does note bilateral LE edema. Patient is compliant with taking Spironolactone. She confirms that she also continues to take Gabapentin regularly.  She does endorse regular bowel movements but does note some previous diarrhea. She reports that she is passing less gas. She does note having a fever for one day which resolved with antibiotics. She denies any other fever. She does complain of SOB which began 6 days ago.   Patient has generally tolerated her treatment well with no significant toxicities, but does complain of extreme fatigue after receiving treatment.  Patient does note some ear pain and was given Zithromax. Her ear pain has since resolved. She denies any jaw or dental issues. She denies any other infection issues. Patient does report three lumps, two of which were on her left neck.   MEDICAL HISTORY:  Past Medical History:  Diagnosis Date   Allergy     Asthma    seasonal   Cellulitis 2013   Left toe   Chronic kidney disease    stage 1   Cirrhosis (HCC)    Common migraine    History of   Complication of anesthesia    Per pt/had breathing problems with "block" during rotator cuff surgery. 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   Coronary artery disease    DDD (degenerative disc disease)    Depression    denies takes paxil for migraines   Diabetes mellitus    type 2   Diabetic peripheral neuropathy (HCC)    Diverticulitis    Diverticulosis    DJD (degenerative joint disease)    Esophageal varices (HCC)    GERD (gastroesophageal reflux disease)    History of colon polyps    hyperplastic   History of gastric polyp    Hyperlipidemia    Hypertension    Iron deficiency anemia    Obesity    OSA on CPAP    cpap   Peripheral neuropathy    Pneumonia    april 2020  mild   Primary localized osteoarthritis of left knee 03/27/2019   Sensorineural hearing loss    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[place])     SURGICAL HISTORY: Past Surgical History:  Procedure Laterality Date   ANKLE SURGERY     Left    BREAST BIOPSY Left 2021   left axilla, lymphoma   COLONOSCOPY  05/2018   LEEP N/A 09/14/2018   Procedure: LOOP ELECTROSURGICAL EXCISION PROCEDURE (LEEP);  Surgeon: Arvella Nigh, MD;  Location: [facility];  Service: Gynecology;  Laterality: N/A;   MASS EXCISION Right 06/24/2021  Procedure: EXCISION RIGHT POSTERIOR NECK MASS;  Surgeon: Kinsinger, [name], MD;  Location: [facility];  Service: General;  Laterality: Right;   ROTATOR CUFF REPAIR Bilateral 2012, 2015   TONSILLECTOMY     TOTAL KNEE ARTHROPLASTY Left 04/08/2019   Procedure: TOTAL KNEE ARTHROPLASTY;  Surgeon: Elsie Saas, MD;  Location: [facility];  Service: Orthopedics;  Laterality: Left;   UMBILICAL HERNIA REPAIR N/A 10/20/2022   Procedure: OPEN HERNIA REPAIR UMBILICAL ADULT with Mesh;  Surgeon: Kinsinger, [name], MD;  Location: [facility];  Service: General;   Laterality: N/A;   UPPER GI ENDOSCOPY  05/2018    SOCIAL HISTORY: Social History   Socioeconomic History   Marital status: Widowed    Spouse name: Not on file   Number of children: 0   Years of education: 3 years college   Highest education level: Not on file  Occupational History   Occupation: Unemployed  Tobacco Use   Smoking status: Never    Passive exposure: Past   Smokeless tobacco: Never  Vaping Use   Vaping Use: Never used  Substance and Sexual Activity   Alcohol use: Not Currently    Comment: very rare   Drug use: No   Sexual activity: Not Currently    Birth control/protection: None, Post-menopausal  Other Topics Concern   Not on file  Social History Narrative   1 caffeine drink daily    Right-handed   widow (husband died from [place] cirrhosis)   Social Determinants of Health   Financial Resource Strain: Medium Risk (08/20/2020)   Overall Financial Resource Strain (CARDIA)    Difficulty of Paying Living Expenses: Somewhat hard  Food Insecurity: No Food Insecurity (10/20/2022)   Hunger Vital Sign    Worried About Running Out of Food in the Last Year: Never true    [place] in the Last Year: Never true  Transportation Needs: No Transportation Needs (10/20/2022)   PRAPARE - Hydrologist (Medical): No    Lack of Transportation (Non-Medical): No  Physical Activity: Not on file  Stress: Not on file  Social Connections: Socially Integrated (08/20/2020)   Social Connection and Isolation Panel [NHANES]    Frequency of Communication with Friends and Family: More than three times a week    Frequency of Social Gatherings with Friends and Family: More than three times a week    Attends Religious Services: More than 4 times per year    Active Member of [employer] or Organizations: Yes    Attends Music therapist: More than 4 times per year    Marital Status: Married  Human resources officer Violence: Not At Risk (10/20/2022)   Humiliation,  Afraid, Rape, and Kick questionnaire    Fear of Current or Ex-Partner: No    Emotionally Abused: No    Physically Abused: No    Sexually Abused: No    FAMILY HISTORY: Family History  Problem Relation Age of Onset   Breast cancer Mother    Bone cancer Mother    Heart failure Father    Diabetes Mellitus II Father    Hypertension Father    Diabetes Mellitus II Sister    Obesity Sister    Other Sister        retina problem   Hypertension Brother    Diabetes Mellitus II Brother    Colon cancer Neg Hx    Rectal cancer Neg Hx    Stomach cancer Neg Hx    Esophageal cancer Neg  Hx    Pancreatic cancer Neg Hx    Liver disease Neg Hx     ALLERGIES:  is allergic to benzonatate, fluticasone furoate-vilanterol, iodine, latex, simvastatin, bupropion, levofloxacin, penicillins, povidone-iodine, amoxicillin, chlorthalidone, diflucan [fluconazole], povidone iodine, amoxicillin-pot clavulanate, and doxycycline.  MEDICATIONS:  Current Outpatient Medications  Medication Sig Dispense Refill   albuterol (VENTOLIN HFA) 108 (90 Base) MCG/ACT inhaler Inhale 1-2 puffs into the lungs every 6 (six) hours as needed (Cough). 18 g 0   amLODipine (NORVASC) 10 MG tablet Take 1 tablet (10 mg total) by mouth daily. 90 tablet 3   aspirin EC 81 MG tablet Take 1 tablet (81 mg total) by mouth daily. Swallow whole. 90 tablet 3   azelastine (ASTELIN) 0.1 % nasal spray Place 1 spray into both nostrils 2 (two) times daily. Use in each nostril as directed 9 mL 11   b complex vitamins capsule Take 1 capsule by mouth daily.     budesonide-formoterol (SYMBICORT) 80-4.5 MCG/ACT inhaler Inhale 2 puffs into the lungs daily. (Patient taking differently: Inhale 2 puffs into the lungs 2 (two) times daily. Patient usually does 2 puffs in morning and 1 puff in evening.) 10.2 g 12   CALCIUM PO Take 1,000 mg by mouth every morning. 500 mg each     Carboxymethylcellul-Glycerin (REFRESH OPTIVE PF OP) Place 1 drop into both eyes daily  as needed (dry eyes). Non-pres     cephALEXin (KEFLEX) 500 MG capsule Take 2 capsules (1,000 mg total) by mouth as directed 1 hour prior to dental work. 20 capsule 0   cetirizine (ZYRTEC) 10 MG tablet Take 10 mg by mouth daily.     Coenzyme Q10 (CO Q-10) 100 MG CAPS Take 100 mg by mouth daily.     Continuous Blood Gluc Sensor (FREESTYLE LIBRE 2 SENSOR) MISC Replace every 14 days and use to check blood sugar continuously topically change every 14 days to monitor blood glucose continuously for 84 days     desoximetasone (TOPICORT) 0.25 % cream Apply a small amount to skin twice a day as needed for flares 60 g 0   diclofenac sodium (VOLTAREN) 1 % GEL Apply 2 g topically 2 (two) times daily as needed (knee pain/ foot /Triger finger).  2   fluticasone (FLONASE) 50 MCG/ACT nasal spray Place 2 sprays into both nostrils in the morning. (Patient taking differently: Place 2 sprays into both nostrils 2 (two) times daily.) 48 g 2   gabapentin (NEURONTIN) 300 MG capsule Take 1 capsule by mouth twice a day (Patient taking differently: Take 600 mg by mouth at bedtime.) 180 capsule 3   glucose blood test strip Use to test blood sugar 3 times daily as directed 300 each 3   hydrALAZINE (APRESOLINE) 25 MG tablet Take 1 tablet (25 mg total) by mouth 2 (two) times daily. 180 tablet 2   insulin aspart (NOVOLOG FLEXPEN) 100 UNIT/ML FlexPen Inject 30 Units into the skin 3 (three) times daily with meals. 30 mL 11   insulin degludec (TRESIBA FLEXTOUCH) 200 UNIT/ML FlexTouch Pen Inject 80 Units into the skin daily. 30 mL 11   Insulin Pen Needle (BD PEN NEEDLE MICRO U/F) 32G X 6 MM MISC Use as directed 6 times daily 400 each 3   Insulin Pen Needle (BD PEN NEEDLE NANO 2ND GEN) 32G X 4 MM MISC Use as directed 6 times daily 400 each 5   irbesartan (AVAPRO) 300 MG tablet Take 1 tablet (300 mg total) by mouth in the morning. 90 tablet  3   Lancets (ONETOUCH DELICA PLUS 123XX123) MISC Use to test blood sugar 3 times daily 300 each 2    LORazepam (ATIVAN) 0.5 MG tablet Take 1-2 tablets (0.5-1 mg total) by mouth once as needed for up to 1 dose (prior to MRI or CT scan for claustrophobia). 4 tablet 0   metFORMIN (GLUCOPHAGE-XR) 500 MG 24 hr tablet Take 2 tablets (1,000 mg total) by mouth 2 (two) times daily. 360 tablet 3   mometasone-formoterol (DULERA) 100-5 MCG/ACT AERO Take 2 puffs first thing in am and then another 2 puffs about 12 hours later. 13 g 11   omeprazole (PRILOSEC) 40 MG capsule Take 1 capsule by mouth twice daily 180 capsule 3   ondansetron (ZOFRAN-ODT) 4 MG disintegrating tablet Take 1 tablet (4 mg total) by mouth every 8 (eight) hours as needed for nausea or vomiting. 20 tablet 0   rosuvastatin (CRESTOR) 10 MG tablet Take 1 tablet (10 mg total) by mouth daily. (Patient taking differently: Take 10 mg by mouth at bedtime.) 90 tablet 2   spironolactone (ALDACTONE) 50 MG tablet Take 1 tablet (50 mg total) by mouth daily. 90 tablet 1   terconazole (TERAZOL 3) 0.8 % vaginal cream Place 1 applicator vaginally daily as needed (yeast infections).     No current facility-administered medications for this visit.    REVIEW OF SYSTEMS:    10 Point review of Systems was done is negative except as noted above.   PHYSICAL EXAMINATION: .BP (!) 130/37 Comment: re-take  Pulse 85   Temp 98.1 F (36.7 C)   Resp 20   Wt 234 lb 9.6 oz (106.4 kg)   SpO2 97%   BMI 39.65 kg/m   GENERAL:alert, in no acute distress and comfortable SKIN: no acute rashes, no significant lesions EYES: conjunctiva are pink and non-injected, sclera anicteric OROPHARYNX: MMM, no exudates, no oropharyngeal erythema or ulceration NECK: supple, no JVD LYMPH:  no palpable lymphadenopathy in the cervical, axillary or inguinal regions LUNGS: clear to auscultation b/l with normal respiratory effort HEART: regular rate & rhythm ABDOMEN:  normoactive bowel sounds , non tender, not distended. Extremity: no pedal edema PSYCH: alert & oriented x 3 with  fluent speech NEURO: no focal motor/sensory deficits   LABORATORY DATA:  I have reviewed the data as listed  .    Latest Ref Rng & Units 12/02/2022   11:33 AM 10/20/2022   11:48 AM 10/03/2022    1:53 PM  CBC  WBC 4.0 - 10.5 K/uL 42.0  56.9  36.4   Hemoglobin 12.0 - 15.0 g/dL 10.9  10.5  10.3   Hematocrit 36.0 - 46.0 % 34.8  34.9  33.7   Platelets 150 - 400 K/uL 112  108  103    . CBC    Component Value Date/Time   WBC 42.0 (H) 12/02/2022 1133   WBC 56.9 (HH) 10/20/2022 1148   RBC 3.71 (L) 12/02/2022 1133   HGB 10.9 (L) 12/02/2022 1133   HGB 10.3 (L) 10/18/2021 1222   HCT 34.8 (L) 12/02/2022 1133   HCT 33.0 (L) 10/18/2021 1222   PLT 112 (L) 12/02/2022 1133   PLT 100 (LL) 10/18/2021 1222   MCV 93.8 12/02/2022 1133   MCV 84 10/18/2021 1222   MCH 29.4 12/02/2022 1133   MCHC 31.3 12/02/2022 1133   RDW 16.9 (H) 12/02/2022 1133   RDW 15.1 10/18/2021 1222   LYMPHSABS PENDING 12/02/2022 1133   MONOABS PENDING 12/02/2022 1133   EOSABS PENDING 12/02/2022 1133  BASOSABS PENDING 12/02/2022 1133     .    Latest Ref Rng & Units 12/02/2022   11:33 AM 10/20/2022   11:48 AM 10/03/2022    1:53 PM  CMP  Glucose 70 - 99 mg/dL 232   209   BUN 8 - 23 mg/dL 12   20   Creatinine 0.44 - 1.00 mg/dL 0.79  0.81  0.77   Sodium 135 - 145 mmol/L 138   132   Potassium 3.5 - 5.1 mmol/L 4.5   6.1   Chloride 98 - 111 mmol/L 107   99   CO2 22 - 32 mmol/L 26   25   Calcium 8.9 - 10.3 mg/dL 8.9   8.9   Total Protein 6.5 - 8.1 g/dL 6.4     Total Bilirubin 0.3 - 1.2 mg/dL 0.4     Alkaline Phos 38 - 126 U/L 71     AST 15 - 41 U/L 21     ALT 0 - 44 U/L 21      . Lab Results  Component Value Date   LDH 187 09/20/2022   . Lab Results  Component Value Date   IRON 29 06/03/2022   TIBC 529 (H) 06/03/2022   IRONPCTSAT 6 (L) 06/03/2022   (Iron and TIBC)  Lab Results  Component Value Date   FERRITIN 39 12/02/2022    04/08/2020 FISH/CLL Panel:   03/30/2020 Left Axilla Flow Pathology  Report [id number]):   03/30/2020 Left axilla lymph node Bx ([id number]):   RADIOGRAPHIC STUDIES: I have personally reviewed the radiological images as listed and agreed with the findings in the report. No results found.  MRI abdomen with and without contrast 07/08/2021 showed IMPRESSION: 1. Moderate motion and patient body habitus degraded exam. 2. Extensive abdominal adenopathy, similar to slightly increased compared to 05/01/2020 PET. 3. Cirrhosis and hepatosplenomegaly with suspicion of periesophageal varices. 4. Nonspecific hypoenhancing or nonenhancing splenic lesions. These could simply represent Crissie Figures bodies in the setting of cirrhosis. Lymphomatous involvement cannot be excluded. 5. Heterogeneous enhancement throughout the liver, without well-defined suspicious mass. Possibly due to altered perfusion in the setting of cirrhosis and porta hepatis adenopathy. Lymphomatous involvement cannot be excluded. Consider pre and post-contrast MRI follow-up at 2-3 months. A more aggressive approach would include random liver biopsy.  MRI abdomen with and without contrast 02/04/2022 showed IMPRESSION: 1. Examination is generally limited by breath motion artifact, particularly multiphasic contrast enhanced sequences. Within this limitation, cirrhotic morphology of the liver without focal liver lesion. 2. 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 3. Trace ascites. 4. Numerous enlarged gastrohepatic ligament, portacaval, retroperitoneal, and small bowel mesenteric lymph nodes are unchanged, in keeping with history of lymphoma.  ASSESSMENT & PLAN:   72 yo with   1) CLL/SLL -02/28/2020 MM Breast ([id number]) revealed "Bilateral axillary adenopathy." -03/30/2020 Left axilla lymph node biopsy ([id number]) revealed "CHRONIC LYMPHOCYTIC LYMPHOMA/SMALL LYMPHOCYTIC LYMPHOMA".   -03/30/2020 Left Axilla Flow Pathology Report [id number]) revealed "Monoclonal B-cell population with coexpression of CD5 comprises 80% of all lymphocytes". -04/20/20 PET/CT revealed 1. Adenopathy within the neck, chest, abdomen, and pelvis, consistent with active lymphoma. Much of this is not significantly hypermetabolic. Some upper abdominal nodes are moderately hypermetabolic.  2) Mild thrombocytopenia PLT 124k Most likely related to advanced liver cirrhosis, less likely related to CLL  #3 severe hypogammaglobinemia due to CLL IgG level 272 Component     Latest Ref Rng 09/20/2022  IgG (Immunoglobin  G), Serum     586 - 1,602 mg/dL 272 (L)   IgA     64 - 422 mg/dL 15 (L)   IgM (Immunoglobulin M), Srm     26 - 217 mg/dL <5 (L)     Legend: (L) Low  #4 iron deficiency . Lab Results  Component Value Date   IRON 29 06/03/2022   TIBC 529 (H) 06/03/2022   IRONPCTSAT 6 (L) 06/03/2022   (Iron and TIBC)  Lab Results  Component Value Date   FERRITIN 22 09/20/2022    PLAN:  -discussed lab results on 12/02/2022 in detail with patient. CBC showed WBC stable at 42.0K, hemoglobin improved to 10.9, and platelets stable at 112K -Patient's low platelet levels may also be related to history of liver sclerosis in addition to CLL -mild anemia -no major changes in blood count from a CLL standpoint -No acute indication for initiating CLL/SLL treatment at this time  -CMP stable, potassium level 4.5 -discussed need to ensure that sclerosis does not show any signs of decomposition -continue IVIG replacement monthly 40 mg/kg -Continue IV Venofer 300 mg weekly x 3 doses with Claritin Tylenol and Pepcid as premedications  -continue vitamin B complex supplements -answered all of patient's questions in detail -Informed patient that IV fluids may attribute to weight gain -informed patient that fatigue may be due to several factors such as CLL, recurrent infections, and surgeries -Recommend an [place]  to scan for any fluid in the abdomen which could be from liver sclerosis, and would be treated with adjusting diuretics   FOLLOW-UP: Continue IVIG as schedule monthly x 4 months Plz cancel labs and MD visit currently schedule on 4/9 RTC with Dr Irene Limbo with labs in 3 months  The total time spent in the appointment was 30 minutes* .  All of the patient's questions were answered with apparent satisfaction. The patient knows to call the clinic with any problems, questions or concerns.   Sullivan Lone MD MS AAHIVMS [facility] Hematology/Oncology Physician [facility]  .*Total Encounter Time as defined by the Centers for Medicare and Medicaid Services includes, in addition to the face-to-face time of a patient visit (documented in the note above) non-face-to-face time: obtaining and reviewing outside history, ordering and reviewing medications, tests or procedures, care coordination (communications with other health care professionals or caregivers) and documentation in the medical record.    I,Mitra Faeizi,acting as a Education administrator for Sullivan Lone, MD.,have documented all relevant documentation on the behalf of Sullivan Lone, MD,as directed by  Sullivan Lone, MD while in the presence of Sullivan Lone, MD.  .I have reviewed the above documentation for accuracy and completeness, and I agree with the above. Brunetta Genera MD

## 2022-12-03 ENCOUNTER — Other Ambulatory Visit (HOSPITAL_COMMUNITY): Payer: Self-pay

## 2022-12-03 MED ORDER — FREESTYLE LIBRE 2 SENSOR MISC
3 refills | Status: DC
  Filled 2022-12-03: qty 2, 28d supply, fill #0

## 2022-12-04 ENCOUNTER — Encounter: Payer: Self-pay | Admitting: Internal Medicine

## 2022-12-05 ENCOUNTER — Inpatient Hospital Stay: Payer: PPO

## 2022-12-05 ENCOUNTER — Other Ambulatory Visit: Payer: Self-pay

## 2022-12-05 VITALS — BP 136/52 | HR 73 | Temp 97.7°F | Resp 17

## 2022-12-05 DIAGNOSIS — C8300 Small cell B-cell lymphoma, unspecified site: Secondary | ICD-10-CM

## 2022-12-05 DIAGNOSIS — D801 Nonfamilial hypogammaglobulinemia: Secondary | ICD-10-CM

## 2022-12-05 MED ORDER — DEXTROSE 5 % IV SOLN: Freq: Once | INTRAVENOUS | Status: AC

## 2022-12-05 MED ORDER — IMMUNE GLOBULIN (HUMAN) 10 GM/100ML IV SOLN
40.0000 g | INTRAVENOUS | Status: DC
  Administered 2022-12-05: 40 g via INTRAVENOUS
  Filled 2022-12-05: qty 400

## 2022-12-05 MED ORDER — ACETAMINOPHEN 325 MG PO TABS
650.0000 mg | ORAL_TABLET | Freq: Once | ORAL | Status: AC
  Administered 2022-12-05: 650 mg via ORAL
  Filled 2022-12-05: qty 2

## 2022-12-05 MED ORDER — METHYLPREDNISOLONE SODIUM SUCC 40 MG IJ SOLR
40.0000 mg | Freq: Every day | INTRAMUSCULAR | Status: DC
  Administered 2022-12-05: 40 mg via INTRAVENOUS
  Filled 2022-12-05: qty 1

## 2022-12-05 MED ORDER — DIPHENHYDRAMINE HCL 25 MG PO CAPS
25.0000 mg | ORAL_CAPSULE | Freq: Once | ORAL | Status: AC
  Administered 2022-12-05: 25 mg via ORAL
  Filled 2022-12-05: qty 1

## 2022-12-05 NOTE — Patient Instructions (Signed)

## 2022-12-05 NOTE — Telephone Encounter (Signed)
PT is calling to get an update from her mychart message. Please advise.

## 2022-12-06 MED ORDER — FUROSEMIDE 20 MG PO TABS
20.0000 mg | ORAL_TABLET | Freq: Every day | ORAL | 1 refills | Status: DC
  Filled 2022-12-06: qty 30, 30d supply, fill #0

## 2022-12-06 NOTE — Telephone Encounter (Signed)
Patient calling wishing to schedule an Asap appointment with Dr. Carlean Purl.

## 2022-12-06 NOTE — Telephone Encounter (Signed)
I spoke to the patient.  I am starting furosemide 20 mg daily.  Add her on for 350 April 1.  I told her about that appointment so no need to call her again.  She was reminded to stay on a low-sodium diet

## 2022-12-06 NOTE — Telephone Encounter (Signed)
Patient has been put on for April 1st at 3:50pm as instructed.

## 2022-12-07 ENCOUNTER — Other Ambulatory Visit: Payer: Self-pay

## 2022-12-07 ENCOUNTER — Other Ambulatory Visit (HOSPITAL_COMMUNITY): Payer: Self-pay

## 2022-12-08 ENCOUNTER — Encounter: Payer: Self-pay | Admitting: Hematology

## 2022-12-09 ENCOUNTER — Other Ambulatory Visit (HOSPITAL_COMMUNITY): Payer: Self-pay

## 2022-12-09 MED ORDER — FREESTYLE LIBRE 2 SENSOR MISC
1.0000 | 3 refills | Status: DC
  Filled 2022-12-09 – 2022-12-21 (×2): qty 6, 84d supply, fill #0
  Filled 2023-03-06: qty 6, 84d supply, fill #1
  Filled 2023-05-28: qty 6, 84d supply, fill #2
  Filled 2023-08-27: qty 6, 84d supply, fill #3

## 2022-12-12 ENCOUNTER — Ambulatory Visit (INDEPENDENT_AMBULATORY_CARE_PROVIDER_SITE_OTHER): Payer: PPO | Admitting: Internal Medicine

## 2022-12-12 ENCOUNTER — Other Ambulatory Visit (INDEPENDENT_AMBULATORY_CARE_PROVIDER_SITE_OTHER): Payer: PPO

## 2022-12-12 ENCOUNTER — Encounter: Payer: Self-pay | Admitting: Internal Medicine

## 2022-12-12 VITALS — BP 120/60 | HR 80 | Ht 64.0 in | Wt 231.0 lb

## 2022-12-12 DIAGNOSIS — R140 Abdominal distension (gaseous): Secondary | ICD-10-CM

## 2022-12-12 DIAGNOSIS — R188 Other ascites: Secondary | ICD-10-CM

## 2022-12-12 DIAGNOSIS — R161 Splenomegaly, not elsewhere classified: Secondary | ICD-10-CM | POA: Diagnosis not present

## 2022-12-12 DIAGNOSIS — K7460 Unspecified cirrhosis of liver: Secondary | ICD-10-CM

## 2022-12-12 DIAGNOSIS — K7581 Nonalcoholic steatohepatitis (NASH): Secondary | ICD-10-CM

## 2022-12-12 DIAGNOSIS — K7469 Other cirrhosis of liver: Secondary | ICD-10-CM

## 2022-12-12 LAB — BASIC METABOLIC PANEL
BUN: 12 mg/dL (ref 6–23)
CO2: 24 mEq/L (ref 19–32)
Calcium: 8.7 mg/dL (ref 8.4–10.5)
Chloride: 103 mEq/L (ref 96–112)
Creatinine, Ser: 0.9 mg/dL (ref 0.40–1.20)
GFR: 64.18 mL/min (ref >60.00)
Glucose, Bld: 194 mg/dL — ABNORMAL HIGH (ref 70–99)
Potassium: 4.3 mEq/L (ref 3.5–5.1)
Sodium: 132 mEq/L — ABNORMAL LOW (ref 135–145)

## 2022-12-12 NOTE — Patient Instructions (Signed)
Your provider has requested that you go to the basement level for lab work before leaving today. Press "B" on the elevator. The lab is located at the first door on the left as you exit the elevator.  You have been scheduled for an  complete abdominal ultrasound at [facility] (1st floor of hospital) on 12/22/2022 at 10:00am. Please arrive 30 minutes prior to your appointment for registration. Make certain not to have anything to eat or drink 6 hours prior to your appointment. Should you need to reschedule your appointment, please contact radiology at [phone]. This test typically takes about 30 minutes to perform.  Dr Carlean Purl will call with any medication changes as needed.  _______________________________________________________  If your blood pressure at your visit was 140/90 or greater, please contact your primary care physician to follow up on this.  _______________________________________________________  If you are age 85 or older, your body mass index should be between 23-30. Your Body mass index is 39.65 kg/m. If this is out of the aforementioned range listed, please consider follow up with your Primary Care Provider.  If you are age 72 or younger, your body mass index should be between 19-25. Your Body mass index is 39.65 kg/m. If this is out of the aformentioned range listed, please consider follow up with your Primary Care Provider.   ________________________________________________________  The [place] [facility] providers would like to encourage you to use [facility] to communicate with providers for non-urgent requests or questions.  Due to long hold times on the telephone, sending your provider a message by [facility] may be a faster and more efficient way to get a response.  Please allow 48 business hours for a response.  Please remember that this is for non-urgent requests.  _______________________________________________________ It was a pleasure to see you today!  Thank you  for trusting me with your gastrointestinal care!

## 2022-12-12 NOTE — Progress Notes (Signed)
Janice [name] 72 y.o. 1950/12/24 [id number]  Assessment & Plan:   Encounter Diagnoses  Name Primary?   Liver cirrhosis secondary to NASH (nonalcoholic steatohepatitis) Yes   Splenomegaly    Abdominal distention    Other ascites      It seems to me that she is volume overloaded and has significant ascites from physical exam.  Will continue diuretics check be met and get an ultrasound.  She may need other imaging given her overall history.  If BUN and creatinine are normal will increase diuretics while waiting for the ultrasound.  Subjective:    NASH  cirrhosis diagnosed 2021 trace ascites at the time no encephalopathy no varices last EGD 2022-hyperplastic polypoid changes in the antrum.  On low-dose spironolactone.  She is not immune to hepatitis A or B and has not been vaccinated   Husband died from liver failure due to NASH   Question of liver lesions-last MRI 02/07/2022 not seen, does have splenomegaly with hypoenhancing splenic lesions stable history of lymphoma with multiple lymph nodes noted on MRI.  PET scan October 2023 without liver lesions  History of adenomatous colonic polyps 2012-2 cm pedunculated tubulovillous adenoma removed, complicated by post polypectomy bleed clipped 01/29/2021 08/01/2016 3 adenomas max 6 mm 06/07/2018 no polyps (iron deficiency anemia evaluation). Does have diverticulosis   History of ulcerated hyperplastic gastric polyp snared 2019 Iron deficiency anemia related to this polyp presumably   Umbilical hernia-considering surgical repair Dr. Kieth Brightly repaired to 10/20/2022, open method    Chief Complaint: Weight gain and abdominal distention  HPI Janice [name] is a 72 year old white woman with history as above who has been gaining weight and having abdominal distention since she had open hernia repair.  She spent the night in the hospital.  She is also had immunoglobulin infusion since then and has progressively increased in weight and feels  distended in her abdomen.  She called me about this and we spoke last week and I added furosemide 20 mg daily to her medication regimen having had been on 50 mg spironolactone alone.  She feels like she is urinating some more but really does not feel a whole lot different with the abdominal distention.  She tells me she does not want to have a paracentesis because of what she saw her husband to go through.  She is a little short of breath.  She has been having drainage from the umbilical hernia surgery site though that has reduced.  She saw Dr. Kieth Brightly in follow-up and one of his PAs as well and I have reviewed those notes from March.   Wt Readings from Last 3 Encounters:  12/12/22 231 lb (104.8 kg)  12/02/22 234 lb 9.6 oz (106.4 kg)  11/29/22 231 lb 9.6 oz (105.1 kg)    Allergies  Allergen Reactions   Benzonatate Anaphylaxis, Hives and Other (See Comments)   Fluticasone Furoate-Vilanterol Anaphylaxis and Hives    Other reaction(s): hives  breo ellipta   Iodine Hives, Swelling and Other (See Comments)    And Betadine/caused hives and swelling   Latex Hives, Itching, Rash and Other (See Comments)    Burning   Simvastatin Other (See Comments)    Caused neuropathy in arms Neuropathy in arms   Bupropion Other (See Comments)    Negative thoughts. Crazy thoughts   Levofloxacin Other (See Comments)    Joint aches//leg, shoulder pain Joint pain   Penicillins Hives   Povidone-Iodine Hives   Amoxicillin Hives    Blisters Did it involve  swelling of the face/tongue/throat, SOB, or low BP? Yes Did it involve sudden or severe rash/hives, skin peeling, or any reaction on the inside of your mouth or nose? Yes Did you need to seek medical attention at a hospital or doctor's office?Unknown When did it last happen? 2019      If all above answers are "NO", may proceed with cephalosporin use.    Chlorthalidone Other (See Comments)    Ears ringing    Diflucan [Fluconazole] Hives and Other (See  Comments)    blister   Povidone Iodine    Amoxicillin-Pot Clavulanate Rash   Doxycycline Itching and Diarrhea   Current Meds  Medication Sig   albuterol (VENTOLIN HFA) 108 (90 Base) MCG/ACT inhaler Inhale 1-2 puffs into the lungs every 6 (six) hours as needed (Cough).   amLODipine (NORVASC) 10 MG tablet Take 1 tablet (10 mg total) by mouth daily.   aspirin EC 81 MG tablet Take 1 tablet (81 mg total) by mouth daily. Swallow whole.   azelastine (ASTELIN) 0.1 % nasal spray Place 1 spray into both nostrils 2 (two) times daily. Use in each nostril as directed   b complex vitamins capsule Take 1 capsule by mouth daily.   budesonide-formoterol (SYMBICORT) 80-4.5 MCG/ACT inhaler Inhale 2 puffs into the lungs daily. (Patient taking differently: Inhale 2 puffs into the lungs 2 (two) times daily. Patient usually does 2 puffs in morning and 1 puff in evening.)   CALCIUM PO Take 1,000 mg by mouth every morning. 500 mg each   Carboxymethylcellul-Glycerin (REFRESH OPTIVE PF OP) Place 1 drop into both eyes daily as needed (dry eyes). Non-pres   cephALEXin (KEFLEX) 500 MG capsule Take 2 capsules (1,000 mg total) by mouth as directed 1 hour prior to dental work.   cetirizine (ZYRTEC) 10 MG tablet Take 10 mg by mouth daily.   Coenzyme Q10 (CO Q-10) 100 MG CAPS Take 100 mg by mouth daily.   Continuous Blood Gluc Sensor (FREESTYLE LIBRE 2 SENSOR) MISC Replace every 14 days and use to check blood sugar continuously topically change every 14 days to monitor blood glucose continuously for 84 days   Continuous Blood Gluc Sensor (FREESTYLE LIBRE 2 SENSOR) MISC use as directed to check blood sugar (change every 14 days)   Continuous Blood Gluc Sensor (FREESTYLE LIBRE 2 SENSOR) MISC Use to monitor blood glucose continuously - change every 14 days.   desoximetasone (TOPICORT) 0.25 % cream Apply a small amount to skin twice a day as needed for flares   diclofenac sodium (VOLTAREN) 1 % GEL Apply 2 g topically 2 (two) times  daily as needed (knee pain/ foot /Triger finger).   fluticasone (FLONASE) 50 MCG/ACT nasal spray Place 2 sprays into both nostrils in the morning. (Patient taking differently: Place 2 sprays into both nostrils 2 (two) times daily.)   furosemide (LASIX) 20 MG tablet Take 1 tablet (20 mg total) by mouth daily.   gabapentin (NEURONTIN) 300 MG capsule Take 1 capsule by mouth twice a day (Patient taking differently: Take 600 mg by mouth at bedtime.)   glucose blood test strip Use to test blood sugar 3 times daily as directed   hydrALAZINE (APRESOLINE) 25 MG tablet Take 1 tablet (25 mg total) by mouth 2 (two) times daily.   insulin aspart (NOVOLOG FLEXPEN) 100 UNIT/ML FlexPen Inject 30 Units into the skin 3 (three) times daily with meals.   insulin degludec (TRESIBA FLEXTOUCH) 200 UNIT/ML FlexTouch Pen Inject 80 Units into the skin daily.  Insulin Pen Needle (BD PEN NEEDLE MICRO U/F) 32G X 6 MM MISC Use as directed 6 times daily   Insulin Pen Needle (BD PEN NEEDLE NANO 2ND GEN) 32G X 4 MM MISC Use as directed 6 times daily   irbesartan (AVAPRO) 300 MG tablet Take 1 tablet (300 mg total) by mouth in the morning.   Lancets (ONETOUCH DELICA PLUS 123XX123) MISC Use to test blood sugar 3 times daily   LORazepam (ATIVAN) 0.5 MG tablet Take 1-2 tablets (0.5-1 mg total) by mouth once as needed for up to 1 dose (prior to MRI or CT scan for claustrophobia).   metFORMIN (GLUCOPHAGE-XR) 500 MG 24 hr tablet Take 2 tablets (1,000 mg total) by mouth 2 (two) times daily.   mometasone-formoterol (DULERA) 100-5 MCG/ACT AERO Take 2 puffs first thing in am and then another 2 puffs about 12 hours later.   omeprazole (PRILOSEC) 40 MG capsule Take 1 capsule by mouth twice daily   ondansetron (ZOFRAN-ODT) 4 MG disintegrating tablet Take 1 tablet (4 mg total) by mouth every 8 (eight) hours as needed for nausea or vomiting.   rosuvastatin (CRESTOR) 10 MG tablet Take 1 tablet (10 mg total) by mouth daily. (Patient taking  differently: Take 10 mg by mouth at bedtime.)   spironolactone (ALDACTONE) 50 MG tablet Take 1 tablet (50 mg total) by mouth daily.   terconazole (TERAZOL 3) 0.8 % vaginal cream Place 1 applicator vaginally daily as needed (yeast infections).   Past Medical History:  Diagnosis Date   Allergy    Asthma    seasonal   Cellulitis 2013   Left toe   Chronic kidney disease    stage 1   Cirrhosis    Common migraine    History of   Complication of anesthesia    Per pt/had breathing problems with "block" during rotator cuff surgery. 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   Coronary artery disease    DDD (degenerative disc disease)    Depression    denies takes paxil for migraines   Diabetes mellitus    type 2   Diabetic peripheral neuropathy    Diverticulitis    Diverticulosis    DJD (degenerative joint disease)    Esophageal varices    GERD (gastroesophageal reflux disease)    History of colon polyps    hyperplastic   History of gastric polyp    Hyperlipidemia    Hypertension    Iron deficiency anemia    Obesity    OSA on CPAP    cpap   Peripheral neuropathy    Pneumonia    april 2020  mild   Primary localized osteoarthritis of left knee 03/27/2019   Sensorineural hearing loss    Small lymphocytic lymphoma    Past Surgical History:  Procedure Laterality Date   ANKLE SURGERY     Left    BREAST BIOPSY Left 2021   left axilla, lymphoma   COLONOSCOPY  05/2018   LEEP N/A 09/14/2018   Procedure: LOOP ELECTROSURGICAL EXCISION PROCEDURE (LEEP);  Surgeon: Arvella Nigh, MD;  Location[facility];  Service: Gynecology;  Laterality: N/A;   MASS EXCISION Right 06/24/2021   Procedure: EXCISION RIGHT POSTERIOR NECK MASS;  Surgeon: Kinsinger, [name], MD;  Location: [facility];  Service: General;  Laterality: Right;   ROTATOR CUFF REPAIR Bilateral 2012, 2015   TONSILLECTOMY     TOTAL KNEE ARTHROPLASTY Left 04/08/2019   Procedure: TOTAL KNEE ARTHROPLASTY;  Surgeon:  Elsie Saas, MD;  Location:  [facility];  Service: Orthopedics;  Laterality: Left;   UMBILICAL HERNIA REPAIR N/A 10/20/2022   Procedure: OPEN HERNIA REPAIR UMBILICAL ADULT with Mesh;  Surgeon: Kinsinger, [name], MD;  Location: [facility];  Service: General;  Laterality: N/A;   UPPER GI ENDOSCOPY  05/2018   Social History   Social History Narrative   1 caffeine drink daily    Right-handed   widow (husband died from [place] cirrhosis)   family history includes Bone cancer in her mother; Breast cancer in her mother; Diabetes Mellitus II in her brother, father, and sister; Heart failure in her father; Hypertension in her brother and father; Obesity in her sister; Other in her sister.   Review of Systems See HPI  Objective:   Physical Exam BP 120/60   Pulse 80   Ht 5\' 4"  (1.626 m)   Wt 231 lb (104.8 kg)   BMI 39.65 kg/m  Chronically ill white woman no acute distress Lungs clear with possible decreased breath sounds at the bases Abdomen is markedly distended with dullness to percussion at the flanks there is a bandage over the umbilical hernia site.  The abdomen is nontender Heart sounds are normal Trembly show trace ankle edema not much She is alert and oriented x 3

## 2022-12-13 ENCOUNTER — Other Ambulatory Visit (HOSPITAL_COMMUNITY): Payer: Self-pay

## 2022-12-13 ENCOUNTER — Encounter: Payer: Self-pay | Admitting: Internal Medicine

## 2022-12-13 ENCOUNTER — Other Ambulatory Visit: Payer: Self-pay

## 2022-12-13 ENCOUNTER — Other Ambulatory Visit: Payer: Self-pay | Admitting: Internal Medicine

## 2022-12-13 MED ORDER — SPIRONOLACTONE 50 MG PO TABS
100.0000 mg | ORAL_TABLET | Freq: Every day | ORAL | 2 refills | Status: DC
  Filled 2022-12-13: qty 60, 30d supply, fill #0

## 2022-12-13 MED ORDER — FUROSEMIDE 20 MG PO TABS
40.0000 mg | ORAL_TABLET | Freq: Every day | ORAL | 2 refills | Status: DC
  Filled 2022-12-13 – 2022-12-30 (×2): qty 60, 30d supply, fill #0

## 2022-12-14 ENCOUNTER — Ambulatory Visit (HOSPITAL_BASED_OUTPATIENT_CLINIC_OR_DEPARTMENT_OTHER)
Admission: RE | Admit: 2022-12-14 | Discharge: 2022-12-14 | Disposition: A | Discharge status: Home / Self Care | Payer: PPO | Source: Ambulatory Visit | Attending: Internal Medicine | Admitting: Internal Medicine

## 2022-12-14 DIAGNOSIS — K7581 Nonalcoholic steatohepatitis (NASH): Secondary | ICD-10-CM | POA: Insufficient documentation

## 2022-12-14 DIAGNOSIS — R188 Other ascites: Secondary | ICD-10-CM | POA: Diagnosis not present

## 2022-12-14 DIAGNOSIS — K7460 Unspecified cirrhosis of liver: Secondary | ICD-10-CM | POA: Diagnosis not present

## 2022-12-14 DIAGNOSIS — R161 Splenomegaly, not elsewhere classified: Secondary | ICD-10-CM | POA: Diagnosis not present

## 2022-12-14 DIAGNOSIS — R140 Abdominal distension (gaseous): Secondary | ICD-10-CM | POA: Diagnosis not present

## 2022-12-14 DIAGNOSIS — K8020 Calculus of gallbladder without cholecystitis without obstruction: Secondary | ICD-10-CM | POA: Diagnosis not present

## 2022-12-16 ENCOUNTER — Encounter: Payer: Self-pay | Admitting: Internal Medicine

## 2022-12-19 ENCOUNTER — Other Ambulatory Visit: Payer: Self-pay

## 2022-12-19 ENCOUNTER — Other Ambulatory Visit (INDEPENDENT_AMBULATORY_CARE_PROVIDER_SITE_OTHER): Payer: PPO

## 2022-12-19 ENCOUNTER — Encounter: Payer: Self-pay | Admitting: Internal Medicine

## 2022-12-19 DIAGNOSIS — K7581 Nonalcoholic steatohepatitis (NASH): Secondary | ICD-10-CM | POA: Diagnosis not present

## 2022-12-19 DIAGNOSIS — K7460 Unspecified cirrhosis of liver: Secondary | ICD-10-CM

## 2022-12-19 LAB — BASIC METABOLIC PANEL
BUN: 17 mg/dL (ref 6–23)
CO2: 25 mEq/L (ref 19–32)
Calcium: 9.3 mg/dL (ref 8.4–10.5)
Chloride: 105 mEq/L (ref 96–112)
Creatinine, Ser: 0.92 mg/dL (ref 0.40–1.20)
GFR: 62.5 mL/min (ref >60.00)
Glucose, Bld: 116 mg/dL — ABNORMAL HIGH (ref 70–99)
Potassium: 4.2 mEq/L (ref 3.5–5.1)
Sodium: 138 mEq/L (ref 135–145)

## 2022-12-20 ENCOUNTER — Ambulatory Visit: Payer: PPO | Admitting: Hematology

## 2022-12-20 ENCOUNTER — Other Ambulatory Visit: Payer: PPO

## 2022-12-20 DIAGNOSIS — N182 Chronic kidney disease, stage 2 (mild): Secondary | ICD-10-CM | POA: Diagnosis not present

## 2022-12-20 DIAGNOSIS — K760 Fatty (change of) liver, not elsewhere classified: Secondary | ICD-10-CM | POA: Diagnosis not present

## 2022-12-20 DIAGNOSIS — I129 Hypertensive chronic kidney disease with stage 1 through stage 4 chronic kidney disease, or unspecified chronic kidney disease: Secondary | ICD-10-CM | POA: Diagnosis not present

## 2022-12-20 DIAGNOSIS — Z794 Long term (current) use of insulin: Secondary | ICD-10-CM | POA: Diagnosis not present

## 2022-12-20 DIAGNOSIS — E119 Type 2 diabetes mellitus without complications: Secondary | ICD-10-CM | POA: Diagnosis not present

## 2022-12-20 DIAGNOSIS — E782 Mixed hyperlipidemia: Secondary | ICD-10-CM | POA: Diagnosis not present

## 2022-12-21 ENCOUNTER — Other Ambulatory Visit: Payer: Self-pay

## 2022-12-21 ENCOUNTER — Other Ambulatory Visit (HOSPITAL_COMMUNITY): Payer: Self-pay

## 2022-12-21 DIAGNOSIS — K7581 Nonalcoholic steatohepatitis (NASH): Secondary | ICD-10-CM | POA: Diagnosis not present

## 2022-12-21 DIAGNOSIS — K429 Umbilical hernia without obstruction or gangrene: Secondary | ICD-10-CM | POA: Diagnosis not present

## 2022-12-21 MED ORDER — BACITRACIN 500 UNIT/GM EX OINT
TOPICAL_OINTMENT | CUTANEOUS | 0 refills | Status: AC
  Filled 2022-12-21: qty 30, 30d supply, fill #0
  Filled 2022-12-26: qty 30, 10d supply, fill #0

## 2022-12-22 ENCOUNTER — Other Ambulatory Visit (HOSPITAL_COMMUNITY): Payer: PPO

## 2022-12-22 ENCOUNTER — Ambulatory Visit (HOSPITAL_COMMUNITY): Payer: PPO

## 2022-12-26 ENCOUNTER — Other Ambulatory Visit (HOSPITAL_COMMUNITY): Payer: Self-pay

## 2022-12-27 ENCOUNTER — Other Ambulatory Visit (HOSPITAL_COMMUNITY): Payer: Self-pay

## 2022-12-27 ENCOUNTER — Other Ambulatory Visit: Payer: Self-pay

## 2022-12-28 NOTE — Telephone Encounter (Signed)
Prior auth was completed in separate encounter. 

## 2022-12-30 ENCOUNTER — Inpatient Hospital Stay: Payer: PPO | Attending: Hematology

## 2022-12-30 ENCOUNTER — Other Ambulatory Visit (HOSPITAL_COMMUNITY): Payer: Self-pay

## 2022-12-30 VITALS — BP 128/58 | HR 71 | Temp 98.0°F | Resp 18 | Wt 219.8 lb

## 2022-12-30 DIAGNOSIS — C9110 Chronic lymphocytic leukemia of B-cell type not having achieved remission: Secondary | ICD-10-CM | POA: Insufficient documentation

## 2022-12-30 DIAGNOSIS — C8300 Small cell B-cell lymphoma, unspecified site: Secondary | ICD-10-CM

## 2022-12-30 DIAGNOSIS — D801 Nonfamilial hypogammaglobulinemia: Secondary | ICD-10-CM | POA: Diagnosis not present

## 2022-12-30 MED ORDER — ACETAMINOPHEN 325 MG PO TABS
650.0000 mg | ORAL_TABLET | Freq: Once | ORAL | Status: AC
  Administered 2022-12-30: 650 mg via ORAL
  Filled 2022-12-30: qty 2

## 2022-12-30 MED ORDER — IMMUNE GLOBULIN (HUMAN) 10 GM/100ML IV SOLN
40.0000 g | INTRAVENOUS | Status: DC
  Administered 2022-12-30: 40 g via INTRAVENOUS
  Filled 2022-12-30: qty 400

## 2022-12-30 MED ORDER — DIPHENHYDRAMINE HCL 25 MG PO CAPS
25.0000 mg | ORAL_CAPSULE | Freq: Once | ORAL | Status: AC
  Administered 2022-12-30: 25 mg via ORAL
  Filled 2022-12-30: qty 1

## 2022-12-30 MED ORDER — DEXTROSE 5 % IV SOLN: INTRAVENOUS | Status: DC

## 2022-12-30 MED ORDER — METHYLPREDNISOLONE SODIUM SUCC 40 MG IJ SOLR
40.0000 mg | Freq: Once | INTRAMUSCULAR | Status: AC
  Administered 2022-12-30: 40 mg via INTRAVENOUS
  Filled 2022-12-30: qty 1

## 2022-12-30 NOTE — Patient Instructions (Signed)

## 2022-12-30 NOTE — Progress Notes (Signed)
Patient did really well with her IVIG- no nausea, but after her 30 minute observation was complete- patient stated that she felt a little anxious? Her blood pressure was a little elevated relative to previous measurements, but patient declined to stay any longer. Overall, no obvious issues. No SOB or CP. Advised that she could go to ED if she had any other issues- patient unconcerned and wanted to go home.  Ambulatory to the lobby.  BP (!) 128/58 (BP Location: Right Arm, Patient Position: Sitting)   Pulse 71   Temp 98 F (36.7 C) (Oral)   Resp 18   Wt 219 lb 12 oz (99.7 kg)   SpO2 96%   BMI 37.72 kg/m

## 2023-01-09 ENCOUNTER — Telehealth: Payer: Self-pay | Admitting: Internal Medicine

## 2023-01-09 NOTE — Telephone Encounter (Signed)
Patient is calling states she has been having some leg and back pain, not sure if it's from Lasix or not. Also wants to know if she needs to stay on it until June even though she has lost her fluid. Please advise

## 2023-01-10 NOTE — Telephone Encounter (Signed)
Left message for pt to call back  °

## 2023-01-10 NOTE — Telephone Encounter (Signed)
Pt called in stating that she has lost 16 pounds since starting the fluid pills and questions if Dr. Leone Payor wants her to remain on the same dose. Pt now weighs 218 lbs. Pt stated that starting April the 19th that she started having shin pain in her left leg and upper back pain. Pt was notified that this is not typical side effects for fluid pills. Please advise on duration of lasix.  Chart was reviewed and noted documentation to pt.   OK - Good to hear Repeat labs were ok also   Say on the new doses of medication and I will get you a follow-up appointment for 6 weeks or so  Last read by Forbes Cellar Laski "[name]" at  7:04 PM on 12/20/2022.   Please advise if same recommendations: Pt has Follow up appointment  on 02/28/2023 at 11:10

## 2023-01-10 NOTE — Telephone Encounter (Signed)
Reduce furosemide to 20 mg daily and spironolactone to 50 mg daily  I have updated med list

## 2023-01-10 NOTE — Telephone Encounter (Signed)
PT returning call. Please advise. 

## 2023-01-10 NOTE — Telephone Encounter (Signed)
Pt made aware of Dr. Gessner recommendations: Pt verbalized understanding with all questions answered.   

## 2023-01-13 ENCOUNTER — Other Ambulatory Visit (HOSPITAL_COMMUNITY): Payer: Self-pay

## 2023-01-16 IMAGING — MR MR ABDOMEN WO/W CM
13 of 19 series · 28 of 48 positions shown · IV contrast (multihance)
Comparison: MR abdomen, 07/08/2021, PET-CT, 05/01/2020

CLINICAL DATA: Cirrhosis, lymphoma, follow-up liver and spleen
lesions

EXAM:
MRI ABDOMEN WITHOUT AND WITH CONTRAST
TECHNIQUE: Multiplanar multisequence MR imaging of the abdomen was performed
both before and after the administration of intravenous contrast.
CONTRAST:  19mL MULTIHANCE GADOBENATE DIMEGLUMINE 529 MG/ML IV SOLN

[Series 7: T2 · coronal · 5.0mm · 1.56mm/px · 2 of 45 slices shown (1 of 3)]
[im 1/45]
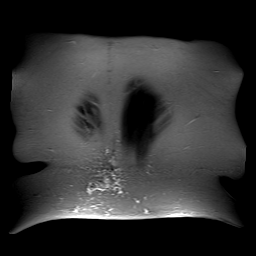
[im 45/45]
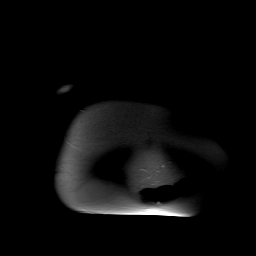

[Series 8: axial tru fisp · axial · 5.0mm · 1.60mm/px · z∈[-208,+57]mm · 2 of 47 slices shown]
[im 1/47]
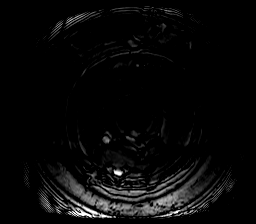
[im 47/47]
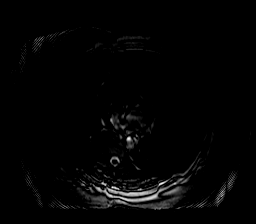

[Series 9: [id number]_500_800_p2 · axial · 6.0mm · 2.08mm/px · z∈[-242,+54]mm · 3 of 117 slices shown]
[im 1/117]
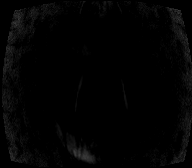
[im 59/117]
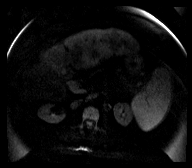
[im 117/117]
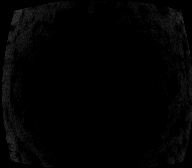

[Series 10: [id number]_adc · axial · 6.0mm · 2.08mm/px · 1 of 39 slices shown]
[im 1/39]
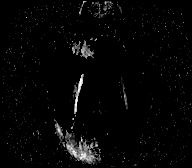

[Series 11: T2 · axial · 5.0mm · 1.60mm/px · 1 of 45 slices shown (2 of 3)]
[im 1/45]
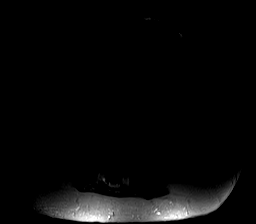

[Series 12: axial in out · axial · 6.0mm · 0.80mm/px · z∈[-222,+61]mm · 2 of 84 slices shown]
[im 1/84]
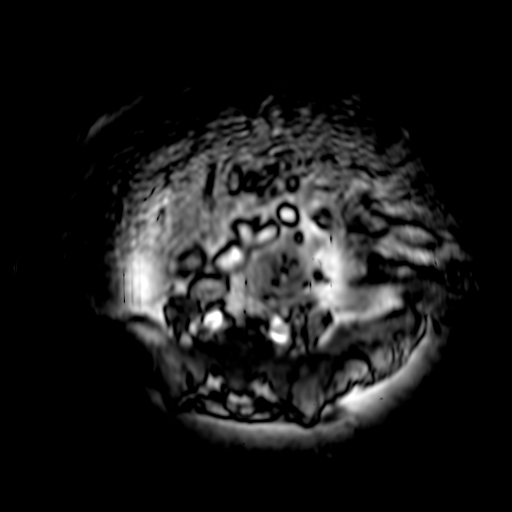
[im 84/84]
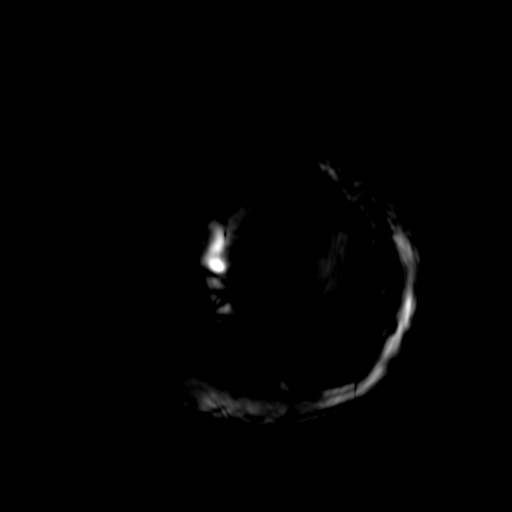

[Series 13: T2 · axial · 6.5mm · 0.80mm/px · 1 of 41 slices shown (3 of 3)]
[im 1/41]
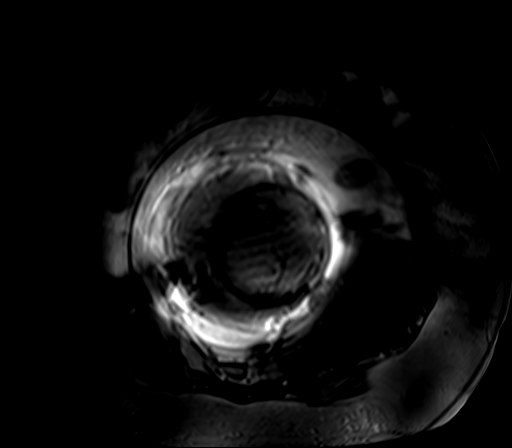

[Series 14: T1 dynamic · axial · non-contrast · 2.0mm · 0.80mm/px · z∈[-225,+61]mm · 3 of 144 slices shown]
[im 1/144]
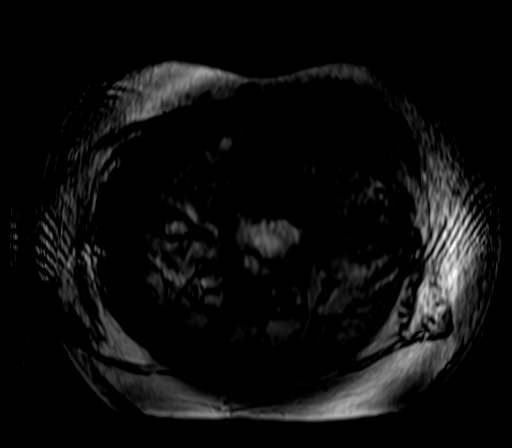
[im 72/144]
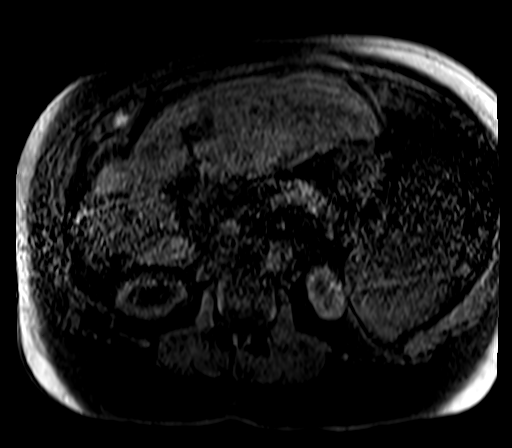
[im 144/144]
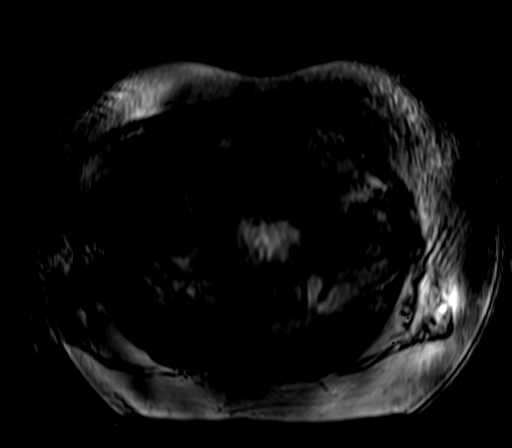

[Series 15: post 30 sec · axial · 2.0mm · 0.80mm/px · z∈[-225,+61]mm · 3 of 144 slices shown]
[im 1/144]
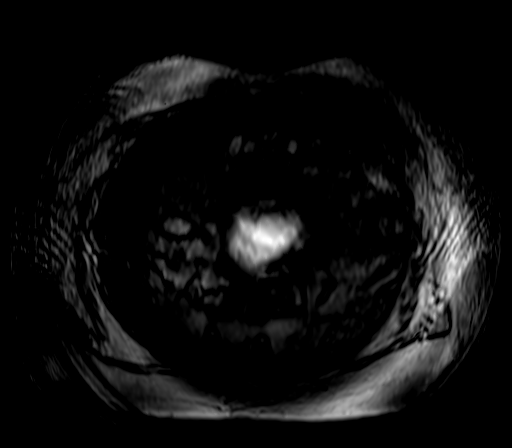
[im 72/144]
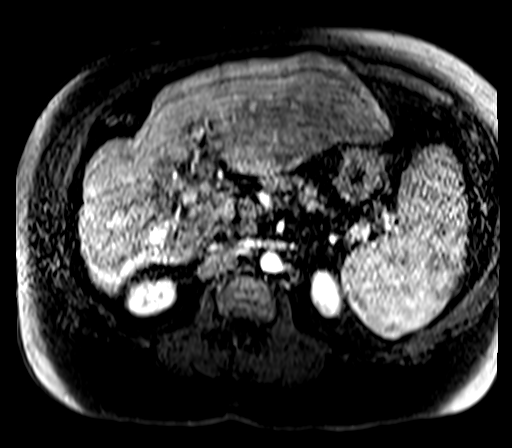
[im 144/144]
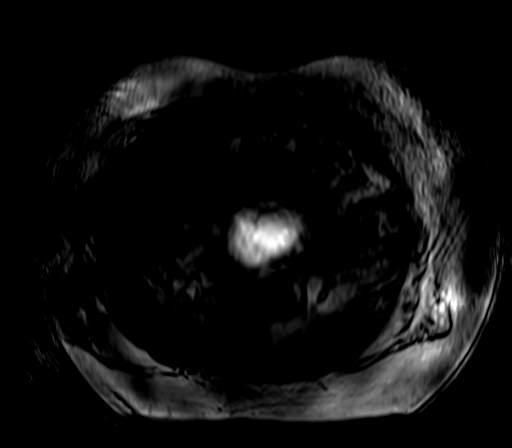

[Series 16: post 30 sec_sub · axial · 2.0mm · 0.80mm/px · z∈[-225,+61]mm · 3 of 144 slices shown]
[im 1/144]
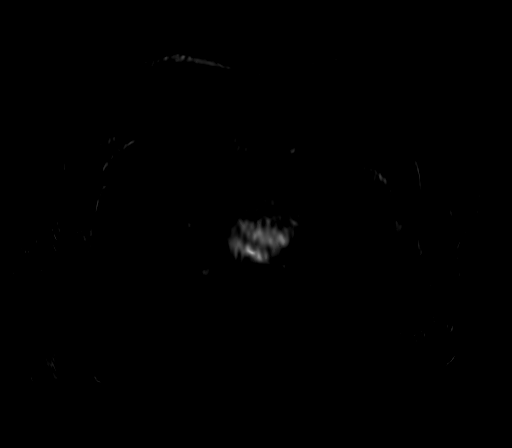
[im 72/144]
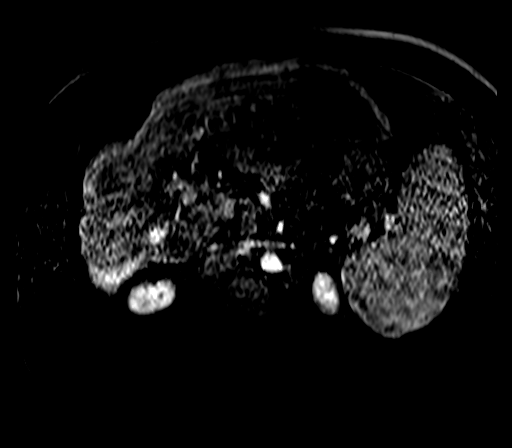
[im 144/144]
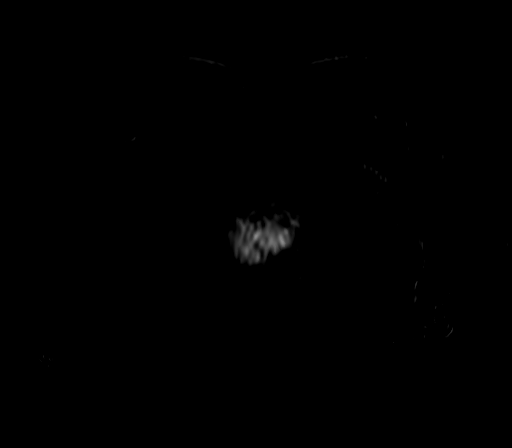

[Series 17: post 60 sec · axial · 2.0mm · 0.80mm/px · z∈[-225,+61]mm · 3 of 144 slices shown]
[im 1/144]
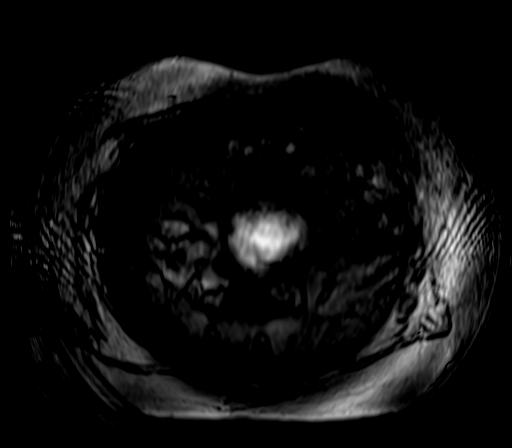
[im 72/144]
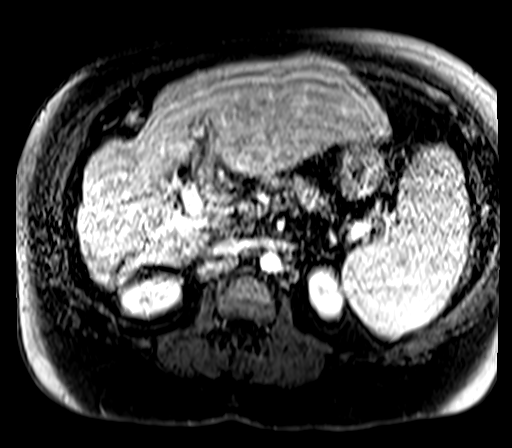
[im 144/144]
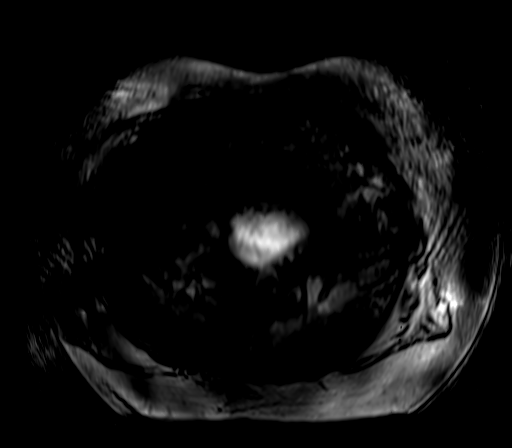

[Series 18: post 60 sec_sub · axial · 2.0mm · 0.80mm/px · z∈[-225,+61]mm · 3 of 144 slices shown]
[im 1/144]
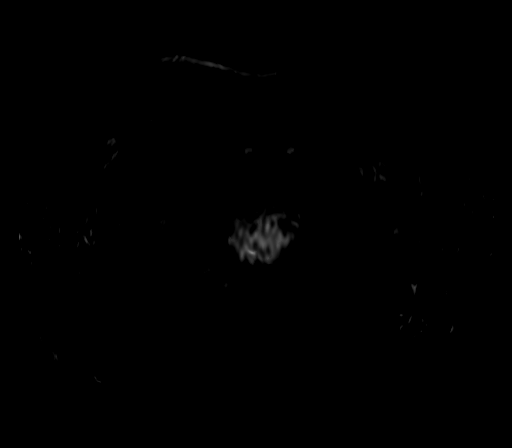
[im 72/144]
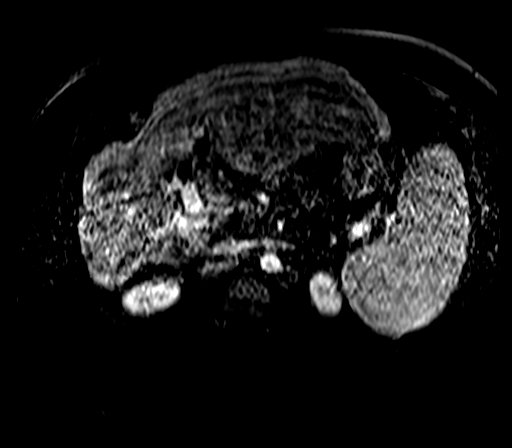
[im 144/144]
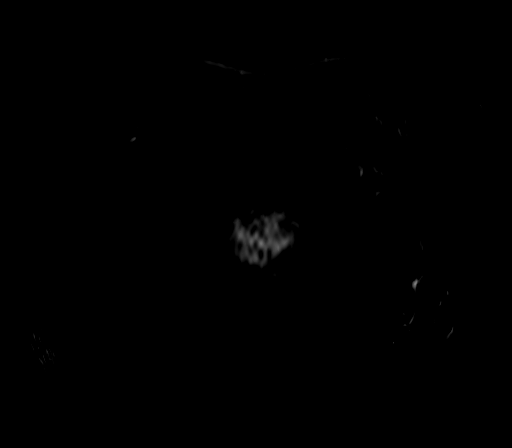

[Series 19: post 90 sec · axial · 2.0mm · 0.80mm/px · 1 of 144 slices shown]
[im 1/144]
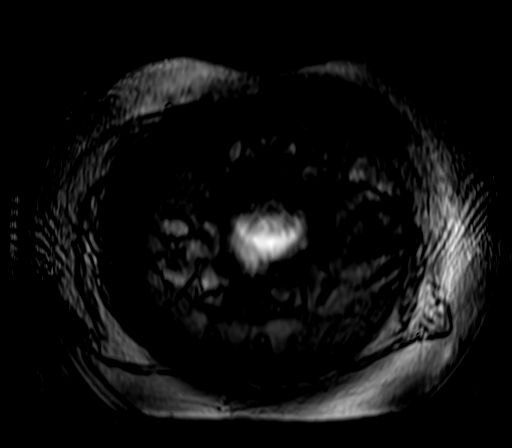

[28 of 48 positions shown; findings below may reference images not displayed]

FINDINGS: Examination is generally limited by breath motion artifact,
particularly multiphasic contrast enhanced sequences.

Lower chest: No acute findings.

Hepatobiliary: Hepatomegaly, maximum coronal span 22.3 cm. Coarse,
nodular cirrhotic morphology of the liver. No arterially
hyperenhancing liver lesions. Heterogeneous liver parenchyma
enhancement without focal lesion. No mass or other parenchymal
abnormality identified. No gallstones. No biliary ductal dilatation.

Pancreas: No mass, inflammatory changes, or other parenchymal
abnormality identified.No pancreatic ductal dilatation.

Spleen: Gross splenomegaly, maximum coronal span 20.1 cm. Numerous
small, hypoenhancing splenic lesions are unchanged, measuring 1.0 cm
and small (series 17, image 78).

Adrenals/Urinary Tract: Normal adrenal glands. No renal masses or
suspicious contrast enhancement identified. No evidence of
hydronephrosis.

Stomach/Bowel: Visualized portions within the abdomen are
unremarkable.

Vascular/Lymphatic: Numerous enlarged gastrohepatic ligament,
portacaval, retroperitoneal, and small bowel mesenteric lymph nodes
are unchanged, index aortocaval node measuring 2.9 x 1.9 cm (series
17, image 96). No abdominal aortic aneurysm demonstrated.

Other:  Trace perihepatic ascites.

Musculoskeletal: No suspicious osseous lesions identified.
IMPRESSION: 1. Examination is generally limited by breath motion artifact,
particularly multiphasic contrast enhanced sequences. Within this
limitation, cirrhotic morphology of the liver without focal liver
lesion.
2. Splenomegaly with numerous small unchanged hypoenhancing splenic
lesions, again most likely [place] bodies in the setting of
cirrhosis.
3. Trace ascites.
4. Numerous enlarged gastrohepatic ligament, portacaval,
retroperitoneal, and small bowel mesenteric lymph nodes are
unchanged, in keeping with history of lymphoma.

## 2023-01-20 ENCOUNTER — Encounter: Payer: Self-pay | Admitting: Hematology

## 2023-01-20 ENCOUNTER — Other Ambulatory Visit: Payer: Self-pay

## 2023-01-27 ENCOUNTER — Inpatient Hospital Stay: Payer: PPO | Attending: Hematology

## 2023-01-27 ENCOUNTER — Other Ambulatory Visit: Payer: Self-pay

## 2023-01-27 VITALS — BP 111/63 | HR 76 | Temp 97.8°F | Resp 16 | Wt 214.5 lb

## 2023-01-27 DIAGNOSIS — D801 Nonfamilial hypogammaglobulinemia: Secondary | ICD-10-CM

## 2023-01-27 DIAGNOSIS — C9110 Chronic lymphocytic leukemia of B-cell type not having achieved remission: Secondary | ICD-10-CM | POA: Diagnosis not present

## 2023-01-27 DIAGNOSIS — C8300 Small cell B-cell lymphoma, unspecified site: Secondary | ICD-10-CM

## 2023-01-27 MED ORDER — METHYLPREDNISOLONE SODIUM SUCC 40 MG IJ SOLR
40.0000 mg | Freq: Once | INTRAMUSCULAR | Status: AC
  Administered 2023-01-27: 40 mg via INTRAVENOUS
  Filled 2023-01-27: qty 1

## 2023-01-27 MED ORDER — ACETAMINOPHEN 325 MG PO TABS
650.0000 mg | ORAL_TABLET | Freq: Once | ORAL | Status: AC
  Administered 2023-01-27: 650 mg via ORAL
  Filled 2023-01-27: qty 2

## 2023-01-27 MED ORDER — IMMUNE GLOBULIN (HUMAN) 10 GM/100ML IV SOLN
400.0000 mg/kg | INTRAVENOUS | Status: DC
  Administered 2023-01-27: 40 g via INTRAVENOUS
  Filled 2023-01-27: qty 400

## 2023-01-27 MED ORDER — DIPHENHYDRAMINE HCL 25 MG PO CAPS
25.0000 mg | ORAL_CAPSULE | Freq: Once | ORAL | Status: AC
  Administered 2023-01-27: 25 mg via ORAL
  Filled 2023-01-27: qty 1

## 2023-01-27 NOTE — Patient Instructions (Signed)

## 2023-01-30 ENCOUNTER — Other Ambulatory Visit (HOSPITAL_COMMUNITY): Payer: Self-pay

## 2023-01-30 MED ORDER — SERTRALINE HCL 50 MG PO TABS
50.0000 mg | ORAL_TABLET | Freq: Every day | ORAL | 3 refills | Status: DC
  Filled 2023-01-30: qty 90, 90d supply, fill #0
  Filled 2023-05-01: qty 90, 90d supply, fill #1
  Filled 2023-07-26: qty 90, 90d supply, fill #2
  Filled 2023-10-24: qty 90, 90d supply, fill #3

## 2023-02-07 ENCOUNTER — Other Ambulatory Visit: Payer: Self-pay | Admitting: Internal Medicine

## 2023-02-07 ENCOUNTER — Other Ambulatory Visit (HOSPITAL_COMMUNITY): Payer: Self-pay

## 2023-02-07 ENCOUNTER — Other Ambulatory Visit: Payer: Self-pay

## 2023-02-09 DIAGNOSIS — Z09 Encounter for follow-up examination after completed treatment for conditions other than malignant neoplasm: Secondary | ICD-10-CM | POA: Diagnosis not present

## 2023-02-09 DIAGNOSIS — K7581 Nonalcoholic steatohepatitis (NASH): Secondary | ICD-10-CM | POA: Diagnosis not present

## 2023-02-10 ENCOUNTER — Other Ambulatory Visit (HOSPITAL_COMMUNITY): Payer: Self-pay

## 2023-02-13 ENCOUNTER — Other Ambulatory Visit: Payer: Self-pay | Admitting: *Deleted

## 2023-02-13 ENCOUNTER — Telehealth: Payer: Self-pay | Admitting: *Deleted

## 2023-02-13 ENCOUNTER — Telehealth: Payer: Self-pay

## 2023-02-13 DIAGNOSIS — K769 Liver disease, unspecified: Secondary | ICD-10-CM

## 2023-02-13 NOTE — Telephone Encounter (Signed)
See alternate telephone encounter

## 2023-02-13 NOTE — Telephone Encounter (Signed)
Patient called to notify of MRI ABDW WO Contrast. Patient notified the nurse she is not wanting to have a MRI without speaking to Dr. Leone Payor first.

## 2023-02-13 NOTE — Telephone Encounter (Signed)
-----   Message from Emeline Darling, RN sent at 02/10/2022  3:07 PM EDT ----- Pt was notified of recent results and Dr. Leone Payor recommendations: Pt was scheduled for 03/30/2022 at 11:10 with Dr. Leone Payor: Pt made aware A reminder placed  for a repeat MRI versus other test (ultrasound or CT) in 1 year. Pt made aware: Pt verbalized understanding with all questions answered.

## 2023-02-13 NOTE — Telephone Encounter (Signed)
error 

## 2023-02-14 ENCOUNTER — Telehealth: Payer: Self-pay | Admitting: Internal Medicine

## 2023-02-14 ENCOUNTER — Other Ambulatory Visit (HOSPITAL_COMMUNITY): Payer: Self-pay

## 2023-02-14 MED ORDER — SPIRONOLACTONE 50 MG PO TABS
50.0000 mg | ORAL_TABLET | Freq: Every day | ORAL | 3 refills | Status: DC
  Filled 2023-02-14: qty 90, 90d supply, fill #0
  Filled 2023-05-09: qty 90, 90d supply, fill #1
  Filled 2023-08-09: qty 90, 90d supply, fill #2
  Filled 2023-11-07: qty 90, 90d supply, fill #3

## 2023-02-14 MED ORDER — FUROSEMIDE 20 MG PO TABS
20.0000 mg | ORAL_TABLET | Freq: Every day | ORAL | 3 refills | Status: DC
  Filled 2023-02-14: qty 90, 90d supply, fill #0
  Filled 2023-06-12: qty 90, 90d supply, fill #1
  Filled 2023-08-27: qty 90, 90d supply, fill #2

## 2023-02-14 NOTE — Telephone Encounter (Signed)
Rx sent  Meds ordered this encounter  Medications   spironolactone (ALDACTONE) 50 MG tablet    Sig: Take 1 tablet (50 mg total) by mouth daily.    Dispense:  90 tablet    Refill:  3   furosemide (LASIX) 20 MG tablet    Sig: Take 1 tablet (20 mg total) by mouth daily.    Dispense:  90 tablet    Refill:  3

## 2023-02-14 NOTE — Telephone Encounter (Signed)
Please advise Sir, thank you. 

## 2023-02-14 NOTE — Telephone Encounter (Signed)
Patient called stated she asked her [name] pharmacy for refills on Lasix and Spironolactone.

## 2023-02-15 ENCOUNTER — Other Ambulatory Visit: Payer: Self-pay

## 2023-02-17 ENCOUNTER — Other Ambulatory Visit: Payer: Self-pay

## 2023-02-21 ENCOUNTER — Other Ambulatory Visit: Payer: Self-pay

## 2023-02-21 DIAGNOSIS — D801 Nonfamilial hypogammaglobulinemia: Secondary | ICD-10-CM

## 2023-02-22 ENCOUNTER — Inpatient Hospital Stay: Payer: PPO | Attending: Hematology

## 2023-02-22 ENCOUNTER — Other Ambulatory Visit: Payer: Self-pay

## 2023-02-22 ENCOUNTER — Inpatient Hospital Stay (HOSPITAL_BASED_OUTPATIENT_CLINIC_OR_DEPARTMENT_OTHER): Payer: PPO | Admitting: Hematology

## 2023-02-22 VITALS — BP 126/33 | HR 72 | Temp 99.0°F | Resp 18 | Ht 64.0 in | Wt 222.0 lb

## 2023-02-22 DIAGNOSIS — D801 Nonfamilial hypogammaglobulinemia: Secondary | ICD-10-CM

## 2023-02-22 DIAGNOSIS — C9110 Chronic lymphocytic leukemia of B-cell type not having achieved remission: Secondary | ICD-10-CM

## 2023-02-22 DIAGNOSIS — C8300 Small cell B-cell lymphoma, unspecified site: Secondary | ICD-10-CM

## 2023-02-22 DIAGNOSIS — R1312 Dysphagia, oropharyngeal phase: Secondary | ICD-10-CM | POA: Diagnosis not present

## 2023-02-22 LAB — CBC WITH DIFFERENTIAL (CANCER CENTER ONLY)
Abs Immature Granulocytes: 0.25 10*3/uL — ABNORMAL HIGH (ref 0.00–0.07)
Basophils Absolute: 0.2 10*3/uL — ABNORMAL HIGH (ref 0.0–0.1)
Basophils Relative: 0 %
Eosinophils Absolute: 0.2 10*3/uL (ref 0.0–0.5)
Eosinophils Relative: 0 %
HCT: 29.1 % — ABNORMAL LOW (ref 36.0–46.0)
Hemoglobin: 9.1 g/dL — ABNORMAL LOW (ref 12.0–15.0)
Immature Granulocytes: 1 %
Lymphocytes Relative: 86 %
Lymphs Abs: 46.3 10*3/uL — ABNORMAL HIGH (ref 0.7–4.0)
MCH: 29.1 pg (ref 26.0–34.0)
MCHC: 31.3 g/dL (ref 30.0–36.0)
MCV: 93 fL (ref 80.0–100.0)
Monocytes Absolute: 2.8 10*3/uL — ABNORMAL HIGH (ref 0.1–1.0)
Monocytes Relative: 5 %
Neutro Abs: 4.4 10*3/uL (ref 1.7–7.7)
Neutrophils Relative %: 8 %
Platelet Count: 93 10*3/uL — ABNORMAL LOW (ref 150–400)
RBC: 3.13 MIL/uL — ABNORMAL LOW (ref 3.87–5.11)
RDW: 17 % — ABNORMAL HIGH (ref 11.5–15.5)
WBC Count: 54.2 10*3/uL (ref 4.0–10.5)
nRBC: 0.1 % (ref 0.0–0.2)

## 2023-02-22 LAB — SAMPLE TO BLOOD BANK

## 2023-02-22 LAB — CMP (CANCER CENTER ONLY)
ALT: 17 U/L (ref 0–44)
AST: 21 U/L (ref 15–41)
Albumin: 3.9 g/dL (ref 3.5–5.0)
Alkaline Phosphatase: 64 U/L (ref 38–126)
Anion gap: 7 (ref 5–15)
BUN: 19 mg/dL (ref 8–23)
CO2: 26 mmol/L (ref 22–32)
Calcium: 9 mg/dL (ref 8.9–10.3)
Chloride: 105 mmol/L (ref 98–111)
Creatinine: 0.84 mg/dL (ref 0.44–1.00)
GFR, Estimated: >60 mL/min (ref >60)
Glucose, Bld: 203 mg/dL — ABNORMAL HIGH (ref 70–99)
Potassium: 4.5 mmol/L (ref 3.5–5.1)
Sodium: 138 mmol/L (ref 135–145)
Total Bilirubin: 0.4 mg/dL (ref 0.3–1.2)
Total Protein: 6.6 g/dL (ref 6.5–8.1)

## 2023-02-22 LAB — LACTATE DEHYDROGENASE: LDH: 194 U/L — ABNORMAL HIGH (ref 98–192)

## 2023-02-22 LAB — FERRITIN: Ferritin: 10 ng/mL — ABNORMAL LOW (ref 11–307)

## 2023-02-22 NOTE — Progress Notes (Signed)
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  INTERVAL HISTORY:  Janice [name] is a 72 y.o. 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 Patient was last seen by me on 12/02/2022 and reported significant drainage from surgical site after an umbilical hernia repair, hematoma aove the surgical site, unexpected 10 pound weight gain, bilateral LE edema, SOB, fatigue after receiving treatments, and three new lumps.   Today, patient notes she is doing well overall. She notes that she has been tolerating her IVIG well and she denies any new infections, such as bronchitis or pneumonia.  She complains of painful and swollen lymph nodes in her neck for the past 5 days that have worsened rapidly and gotten larger. She denies any other new lumps/bumps. She notes that her seasonal allergies have worsened since her last infusion.   She reports one occasion of fever, with a temperature of 101 that occurred 3 weeks ago and resolved the next day. To improve symptoms she used an ice pack as well as 2 capsules of Tylenol. Patient also complains of pain in her left ear. She denies any pain when moving the left ear. She does note tenderness in left ear when moving it.   She notes of swallowing issues and says she struggles to initiate swallowing. Patient notes she had a dental procedure in April, but is unsure if that has attributed to her swallowing issues. Her swallowing issues does limit her p.o. intake. She notes that she presented to the ED in November 2023 for frequent choking issues. She  denies any painful dental issues at this time.  She complains of a headache for the past couple days and takes Tylenol to improve it towards the end of the day. Patient also complains of frequent nosebleeds with large clots of blood. She reports 14 occasiona over course of 3 weeks. She denies having any black stools or blood in the stools. She does take Aspirin 1x a day.  She notes returned SOB. Patient reports she takes cough drops and uses an inhaler which does improve symptoms. She does note she has allergies. She complains of occasional left should pain and frequent fatigue.  She denies and dental issues, back pain, or abdominal pain. Patient notes mental cloudiness 5x a week. Patient notes fatigue  which limits her daily activities around the home.   She will follow-up with her GI on Tuesday. She reported that she has a recent US of the abdomen with Dr. Leone Payor and that Dr. Leone Payor recently ordered an MRI.  Patient reports that her tonsils were previously removed though recent imaging shows that they are present.  MEDICAL HISTORY:  Past Medical History:  Diagnosis Date   Allergy    Asthma    seasonal   Cellulitis 2013   Left toe   Chronic kidney disease    stage 1   Cirrhosis (HCC)    Common migraine    History of   Complication of anesthesia    Per pt/had breathing problems with "block" during rotator cuff surgery. 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   Coronary artery disease    DDD (degenerative disc disease)    Depression  denies takes paxil for migraines   Diabetes mellitus    type 2   Diabetic peripheral neuropathy (HCC)    Diverticulitis    Diverticulosis    DJD (degenerative joint disease)    Esophageal varices (HCC)    GERD (gastroesophageal reflux disease)    History of colon polyps    hyperplastic   History of gastric polyp    Hyperlipidemia    Hypertension    Iron deficiency anemia    Obesity    OSA on CPAP    cpap   Peripheral neuropathy     Pneumonia    april 2020  mild   Primary localized osteoarthritis of left knee 03/27/2019   Sensorineural hearing loss    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HCC)     SURGICAL HISTORY: Past Surgical History:  Procedure Laterality Date   ANKLE SURGERY     Left    BREAST BIOPSY Left 2021   left axilla, lymphoma   COLONOSCOPY  05/2018   LEEP N/A 09/14/2018   Procedure: LOOP ELECTROSURGICAL EXCISION PROCEDURE (LEEP);  Surgeon: Richardean Chimera, MD;  Location: [facility];  Service: Gynecology;  Laterality: N/A;   MASS EXCISION Right 06/24/2021   Procedure: EXCISION RIGHT POSTERIOR NECK MASS;  Surgeon: Kinsinger, De Blanch, MD;  Location: [facility];  Service: General;  Laterality: Right;   ROTATOR CUFF REPAIR Bilateral 2012, 2015   TONSILLECTOMY     TOTAL KNEE ARTHROPLASTY Left 04/08/2019   Procedure: TOTAL KNEE ARTHROPLASTY;  Surgeon: Salvatore Marvel, MD;  Location: [facility];  Service: Orthopedics;  Laterality: Left;   UMBILICAL HERNIA REPAIR N/A 10/20/2022   Procedure: OPEN HERNIA REPAIR UMBILICAL ADULT with Mesh;  Surgeon: Kinsinger, De Blanch, MD;  Location: [facility];  Service: General;  Laterality: N/A;   UPPER GI ENDOSCOPY  05/2018    SOCIAL HISTORY: Social History   Socioeconomic History   Marital status: Widowed    Spouse name: Not on file   Number of children: 0   Years of education: 3 years college   Highest education level: Not on file  Occupational History   Occupation: Unemployed  Tobacco Use   Smoking status: Never    Passive exposure: Past   Smokeless tobacco: Never  Vaping Use   Vaping Use: Never used  Substance and Sexual Activity   Alcohol use: Not Currently    Comment: very rare   Drug use: No   Sexual activity: Not Currently    Birth control/protection: None, Post-menopausal  Other Topics Concern   Not on file  Social History Narrative   1 caffeine drink daily    Right-handed   widow (husband died from [place] cirrhosis)   Social Determinants of Health    Financial Resource Strain: Medium Risk (08/20/2020)   Overall Financial Resource Strain (CARDIA)    Difficulty of Paying Living Expenses: Somewhat hard  Food Insecurity: No Food Insecurity (10/20/2022)   Hunger Vital Sign    Worried About Running Out of Food in the Last Year: Never true    Ran Out of Food in the Last Year: Never true  Transportation Needs: No Transportation Needs (10/20/2022)   PRAPARE - Administrator, Civil Service (Medical): No    Lack of Transportation (Non-Medical): No  Physical Activity: Not on file  Stress: Not on file  Social Connections: Socially Integrated (08/20/2020)   Social Connection and Isolation Panel [NHANES]    Frequency of Communication with Friends and Family: More than three times a week  Frequency of Social Gatherings with Friends and Family: More than three times a week    Attends Religious Services: More than 4 times per year    Active Member of [employer] or Organizations: Yes    Attends Engineer, structural: More than 4 times per year    Marital Status: Married  Catering manager Violence: Not At Risk (10/20/2022)   Humiliation, Afraid, Rape, and Kick questionnaire    Fear of Current or Ex-Partner: No    Emotionally Abused: No    Physically Abused: No    Sexually Abused: No    FAMILY HISTORY: Family History  Problem Relation Age of Onset   Breast cancer Mother    Bone cancer Mother    Heart failure Father    Diabetes Mellitus II Father    Hypertension Father    Diabetes Mellitus II Sister    Obesity Sister    Other Sister        retina problem   Hypertension Brother    Diabetes Mellitus II Brother    Colon cancer Neg Hx    Rectal cancer Neg Hx    Stomach cancer Neg Hx    Esophageal cancer Neg Hx    Pancreatic cancer Neg Hx    Liver disease Neg Hx     ALLERGIES:  is allergic to benzonatate, fluticasone furoate-vilanterol, iodine, latex, simvastatin, bupropion, levofloxacin, penicillins, povidone-iodine,  amoxicillin, chlorthalidone, diflucan [fluconazole], povidone iodine, amoxicillin-pot clavulanate, and doxycycline.  MEDICATIONS:  Current Outpatient Medications  Medication Sig Dispense Refill   albuterol (VENTOLIN HFA) 108 (90 Base) MCG/ACT inhaler Inhale 1-2 puffs into the lungs every 6 (six) hours as needed (Cough). 18 g 0   amLODipine (NORVASC) 10 MG tablet Take 1 tablet (10 mg total) by mouth daily. 90 tablet 3   aspirin EC 81 MG tablet Take 1 tablet (81 mg total) by mouth daily. Swallow whole. 90 tablet 3   azelastine (ASTELIN) 0.1 % nasal spray Place 1 spray into both nostrils 2 (two) times daily. Use in each nostril as directed 9 mL 11   b complex vitamins capsule Take 1 capsule by mouth daily.     bacitracin 500 UNIT/GM ointment Apply topically 2 (two) times daily for 10 days 30 g 0   budesonide-formoterol (SYMBICORT) 80-4.5 MCG/ACT inhaler Inhale 2 puffs into the lungs daily. (Patient taking differently: Inhale 2 puffs into the lungs 2 (two) times daily. Patient usually does 2 puffs in morning and 1 puff in evening.) 10.2 g 12   CALCIUM PO Take 1,000 mg by mouth every morning. 500 mg each     Carboxymethylcellul-Glycerin (REFRESH OPTIVE PF OP) Place 1 drop into both eyes daily as needed (dry eyes). Non-pres     cephALEXin (KEFLEX) 500 MG capsule Take 2 capsules (1,000 mg total) by mouth as directed 1 hour prior to dental work. 20 capsule 0   cetirizine (ZYRTEC) 10 MG tablet Take 10 mg by mouth daily.     Coenzyme Q10 (CO Q-10) 100 MG CAPS Take 100 mg by mouth daily.     Continuous Blood Gluc Sensor (FREESTYLE LIBRE 2 SENSOR) MISC Use to monitor blood glucose continuously - change every 14 days. 6 each 3   desoximetasone (TOPICORT) 0.25 % cream Apply a small amount to skin twice a day as needed for flares 60 g 0   diclofenac sodium (VOLTAREN) 1 % GEL Apply 2 g topically 2 (two) times daily as needed (knee pain/ foot /Triger finger).  2   fluticasone (FLONASE)  50 MCG/ACT nasal spray  Place 2 sprays into both nostrils in the morning. (Patient taking differently: Place 2 sprays into both nostrils 2 (two) times daily.) 48 g 2   furosemide (LASIX) 20 MG tablet Take 1 tablet (20 mg total) by mouth daily. 90 tablet 3   gabapentin (NEURONTIN) 300 MG capsule Take 1 capsule by mouth twice a day (Patient taking differently: Take 600 mg by mouth at bedtime.) 180 capsule 3   glucose blood test strip Use to test blood sugar 3 times daily as directed 300 each 3   hydrALAZINE (APRESOLINE) 25 MG tablet Take 1 tablet (25 mg total) by mouth 2 (two) times daily. 180 tablet 2   insulin aspart (NOVOLOG FLEXPEN) 100 UNIT/ML FlexPen Inject 30 Units into the skin 3 (three) times daily with meals. 30 mL 11   insulin degludec (TRESIBA FLEXTOUCH) 200 UNIT/ML FlexTouch Pen Inject 80 Units into the skin daily. 30 mL 11   Insulin Pen Needle (BD PEN NEEDLE MICRO U/F) 32G X 6 MM MISC Use as directed 6 times daily 400 each 3   Insulin Pen Needle (BD PEN NEEDLE NANO 2ND GEN) 32G X 4 MM MISC Use as directed 6 times daily 400 each 5   irbesartan (AVAPRO) 300 MG tablet Take 1 tablet (300 mg total) by mouth in the morning. 90 tablet 3   Lancets (ONETOUCH DELICA PLUS LANCET33G) MISC Use to test blood sugar 3 times daily 300 each 2   LORazepam (ATIVAN) 0.5 MG tablet Take 1-2 tablets (0.5-1 mg total) by mouth once as needed for up to 1 dose (prior to MRI or CT scan for claustrophobia). 4 tablet 0   metFORMIN (GLUCOPHAGE-XR) 500 MG 24 hr tablet Take 2 tablets (1,000 mg total) by mouth 2 (two) times daily. 360 tablet 3   mometasone-formoterol (DULERA) 100-5 MCG/ACT AERO Take 2 puffs first thing in am and then another 2 puffs about 12 hours later. 13 g 11   omeprazole (PRILOSEC) 40 MG capsule Take 1 capsule by mouth twice daily 180 capsule 3   ondansetron (ZOFRAN-ODT) 4 MG disintegrating tablet Take 1 tablet (4 mg total) by mouth every 8 (eight) hours as needed for nausea or vomiting. 20 tablet 0   rosuvastatin (CRESTOR)  10 MG tablet Take 1 tablet (10 mg total) by mouth daily. (Patient taking differently: Take 10 mg by mouth at bedtime.) 90 tablet 2   sertraline (ZOLOFT) 50 MG tablet Take 1 tablet (50 mg total) by mouth daily. 90 tablet 3   spironolactone (ALDACTONE) 50 MG tablet Take 1 tablet (50 mg total) by mouth daily. 90 tablet 3   terconazole (TERAZOL 3) 0.8 % vaginal cream Place 1 applicator vaginally daily as needed (yeast infections).     No current facility-administered medications for this visit.    REVIEW OF SYSTEMS:    10 Point review of Systems was done is negative except as noted above.  PHYSICAL EXAMINATION: .BP (!) 126/33 (BP Location: Right Arm, Patient Position: Sitting) Comment: nurse is aware  Pulse 72   Temp 99 F (37.2 C) (Temporal)   Resp 18   Ht 5\' 4"  (1.626 m)   Wt 222 lb (100.7 kg)   SpO2 96%   BMI 38.11 kg/m  GENERAL:alert, in no acute distress and comfortable SKIN: no acute rashes, no significant lesions EYES: conjunctiva are pink and non-injected, sclera anicteric OROPHARYNX: MMM, no exudates, no oropharyngeal erythema or ulceration NECK: supple, no JVD LYMPH:  no palpable lymphadenopathy in the cervical, axillary  or inguinal regions LUNGS: clear to auscultation b/l with normal respiratory effort HEART: regular rate & rhythm ABDOMEN:  normoactive bowel sounds , non tender, not distended. Extremity: no pedal edema PSYCH: alert & oriented x 3 with fluent speech NEURO: no focal motor/sensory deficits  LABORATORY DATA:  I have reviewed the data as listed  .    Latest Ref Rng & Units 02/22/2023   11:27 AM 12/02/2022   11:33 AM 10/20/2022   11:48 AM  CBC  WBC 4.0 - 10.5 K/uL 54.2  42.0  56.9   Hemoglobin 12.0 - 15.0 g/dL 9.1  16.1  09.6   Hematocrit 36.0 - 46.0 % 29.1  34.8  34.9   Platelets 150 - 400 K/uL 93  112  108    . CBC    Component Value Date/Time   WBC 54.2 (HH) 02/22/2023 1127   WBC 56.9 (HH) 10/20/2022 1148   RBC 3.13 (L) 02/22/2023 1127   HGB  9.1 (L) 02/22/2023 1127   HGB 10.3 (L) 10/18/2021 1222   HCT 29.1 (L) 02/22/2023 1127   HCT 33.0 (L) 10/18/2021 1222   PLT 93 (L) 02/22/2023 1127   PLT 100 (LL) 10/18/2021 1222   MCV 93.0 02/22/2023 1127   MCV 84 10/18/2021 1222   MCH 29.1 02/22/2023 1127   MCHC 31.3 02/22/2023 1127   RDW 17.0 (H) 02/22/2023 1127   RDW 15.1 10/18/2021 1222   LYMPHSABS 46.3 (H) 02/22/2023 1127   MONOABS 2.8 (H) 02/22/2023 1127   EOSABS 0.2 02/22/2023 1127   BASOSABS 0.2 (H) 02/22/2023 1127     .    Latest Ref Rng & Units 02/22/2023   11:27 AM 12/19/2022   12:47 PM 12/12/2022    5:04 PM  CMP  Glucose 70 - 99 mg/dL 045  409  811   BUN 8 - 23 mg/dL 19  17  12    Creatinine 0.44 - 1.00 mg/dL 9.14  7.82  9.56   Sodium 135 - 145 mmol/L 138  138  132   Potassium 3.5 - 5.1 mmol/L 4.5  4.2  4.3   Chloride 98 - 111 mmol/L 105  105  103   CO2 22 - 32 mmol/L 26  25  24    Calcium 8.9 - 10.3 mg/dL 9.0  9.3  8.7   Total Protein 6.5 - 8.1 g/dL 6.6     Total Bilirubin 0.3 - 1.2 mg/dL 0.4     Alkaline Phos 38 - 126 U/L 64     AST 15 - 41 U/L 21     ALT 0 - 44 U/L 17      . Lab Results  Component Value Date   LDH 194 (H) 02/22/2023   . Lab Results  Component Value Date   IRON 29 06/03/2022   TIBC 529 (H) 06/03/2022   IRONPCTSAT 6 (L) 06/03/2022   (Iron and TIBC)  Lab Results  Component Value Date   FERRITIN 10 (L) 02/22/2023    04/08/2020 FISH/CLL Panel:   03/30/2020 Left Axilla Flow Pathology Report [id number]):   03/30/2020 Left axilla lymph node Bx ([id number]):   RADIOGRAPHIC STUDIES: I have personally reviewed the radiological images as listed and agreed with the findings in the report. No results found.  MRI abdomen with and without contrast 07/08/2021 showed IMPRESSION: 1. Moderate motion and patient body habitus degraded exam. 2. Extensive abdominal adenopathy, similar to slightly increased compared to 05/01/2020 PET. 3. Cirrhosis and hepatosplenomegaly with suspicion of  periesophageal varices. 4. Nonspecific hypoenhancing  or nonenhancing splenic lesions. These could simply represent Hessie Knows bodies in the setting of cirrhosis. Lymphomatous involvement cannot be excluded. 5. Heterogeneous enhancement throughout the liver, without well-defined suspicious mass. Possibly due to altered perfusion in the setting of cirrhosis and porta hepatis adenopathy. Lymphomatous involvement cannot be excluded. Consider pre and post-contrast MRI follow-up at 2-3 months. A more aggressive approach would include random liver biopsy.  MRI abdomen with and without contrast 02/04/2022 showed IMPRESSION: 1. Examination is generally limited by breath motion artifact, particularly multiphasic contrast enhanced sequences. Within this limitation, cirrhotic morphology of the liver without focal liver lesion. 2. 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 3. Trace ascites. 4. Numerous enlarged gastrohepatic ligament, portacaval, retroperitoneal, and small bowel mesenteric lymph nodes are unchanged, in keeping with history of lymphoma.  ASSESSMENT & PLAN:   72 yo with   1) CLL/SLL -02/28/2020 MM Breast ([id number]) revealed "Bilateral axillary adenopathy." -03/30/2020 Left axilla lymph node biopsy ([id number]) revealed "CHRONIC LYMPHOCYTIC LYMPHOMA/SMALL LYMPHOCYTIC LYMPHOMA".  -03/30/2020 Left Axilla Flow Pathology Report [id number]) revealed "Monoclonal B-cell population with coexpression of CD5 comprises 80% of all lymphocytes". -04/20/20 PET/CT revealed 1. Adenopathy within the neck, chest, abdomen, and pelvis, consistent with active lymphoma. Much of this is not significantly hypermetabolic. Some upper abdominal nodes are moderately hypermetabolic.  2) Mild thrombocytopenia Most likely related to advanced liver cirrhosis, now with some decrease to 93k which could also be  from CLL  #3 severe hypogammaglobinemia due to CLL IgG level 272 Component     Latest Ref Rng 09/20/2022  IgG (Immunoglobin G), Serum     586 - 1,602 mg/dL 956 (L)   IgA     64 - 422 mg/dL 15 (L)   IgM (Immunoglobulin M), Srm     26 - 217 mg/dL <5 (L)     Legend: (L) Low  #4 iron deficiency Anemia-- from Epistaxis + ?GI losses. NO overt GI bleeding noted. . Lab Results  Component Value Date   IRON 29 06/03/2022   TIBC 529 (H) 06/03/2022   IRONPCTSAT 6 (L) 06/03/2022   (Iron and TIBC)  Lab Results  Component Value Date   FERRITIN 10 (L) 02/22/2023    PLAN:  -Discussed lab results on 02/22/2023 in detail with patient. CBC showed WBC stable and elevated at 54.2 K, hemoglobin decreased to 9.1 from 10.9, and platelets slightly low and stable at 93 K. -Mild anemia -CMP stable other than glucose level of 203. -Iron labs show Iron deficiency with ferritin of 10 and iron saturation of 6% -Discussed previous US of abdomen that showed enlargement of the spleen -Discussed previous CT neck scan from 07/21/2022 showed slightly enlarged tonsils -continue IVIG replacement monthly 400 mg/kg -will need additional IV Venofer 300 mg weekly x 3 doses with Claritin Tylenol and Pepcid as premedications  -continue vitamin B complex supplements -Discussed details of potential CLL treatment options including: Ibrutinib/BTK inhibitors. Discussed that this may increase the risk of bleeding and is an open ended treatment. Would not recommend especially with patient's epistaxis symptoms Combination of antibody treatments including Gazyva and Venetoclax. Discussed that pt would receive medication to prevent major side effects -Discussed side effects of treatments, including nausea and diarrhea, and fatigue -Educated patient on infection issues and enlarged lymph nodes caused by CLL -Informed patient on how CLL can cause lowered blood counts and increasing rates of infection -Answered all of patient's  questions regarding CLL treatments, and potential effects  of CLL -Patient does meet criteria to initiate treatment for CLL, including: Bulky/symptomatic lymph nodes potentially pushing on food pipe/voice box Anemia and thrombocytopenia Enlarged spleen Fatigue -will order CT neck to evaluate lymph nodes pushing on food pipe -will order swallow study with speech therapist -will set up counseling to discuss details of targeted therapy treatment -MRI was ordered by GI for evaluation of liver -Patient received [place] on 12/15/2022 with GI which showed enlarged spleen -Discussed the option of getting a whole body CT scan, though PET scan would be preferred -patient is scheduled to receive immunoglobulin on Monday, February 27, 2023 -Patient has a scheduled visit with GI next Tuesday, June 18th  FOLLOW-UP: CT neck and Mod barium swallow in 1week PET/CT in 1 week Continue IVIG every 4 weeks x 6 IV Venofer 300mg  weekly x 3 doses RTC with Dr Candise Che in 3 weeks (to followup on imaging and schedule for Gazyva/Venetoclax)  The total time spent in the appointment was 40 minutes* .  All of the patient's questions were answered with apparent satisfaction. The patient knows to call the clinic with any problems, questions or concerns.   Wyvonnia Lora MD MS AAHIVMS [facility] Hematology/Oncology Physician [facility]  .*Total Encounter Time as defined by the Centers for Medicare and Medicaid Services includes, in addition to the face-to-face time of a patient visit (documented in the note above) non-face-to-face time: obtaining and reviewing outside history, ordering and reviewing medications, tests or procedures, care coordination (communications with other health care professionals or caregivers) and documentation in the medical record.   I,Mitra Faeizi,acting as a Neurosurgeon for Wyvonnia Lora, MD.,have documented all relevant documentation on the behalf of Wyvonnia Lora, MD,as directed by  Wyvonnia Lora, MD  while in the presence of Wyvonnia Lora, MD.  .I have reviewed the above documentation for accuracy and completeness, and I agree with the above. Johney Maine MD

## 2023-02-23 ENCOUNTER — Ambulatory Visit: Payer: PPO | Admitting: Cardiology

## 2023-02-23 ENCOUNTER — Encounter: Payer: Self-pay | Admitting: Cardiology

## 2023-02-23 ENCOUNTER — Encounter: Payer: Self-pay | Admitting: Hematology

## 2023-02-23 ENCOUNTER — Inpatient Hospital Stay: Payer: PPO | Admitting: Licensed Clinical Social Worker

## 2023-02-23 VITALS — BP 140/55 | HR 78 | Ht 64.0 in | Wt 221.0 lb

## 2023-02-23 DIAGNOSIS — R0609 Other forms of dyspnea: Secondary | ICD-10-CM | POA: Diagnosis not present

## 2023-02-23 DIAGNOSIS — I351 Nonrheumatic aortic (valve) insufficiency: Secondary | ICD-10-CM | POA: Diagnosis not present

## 2023-02-23 DIAGNOSIS — I6523 Occlusion and stenosis of bilateral carotid arteries: Secondary | ICD-10-CM

## 2023-02-23 DIAGNOSIS — C8300 Small cell B-cell lymphoma, unspecified site: Secondary | ICD-10-CM

## 2023-02-23 DIAGNOSIS — I10 Essential (primary) hypertension: Secondary | ICD-10-CM | POA: Diagnosis not present

## 2023-02-23 DIAGNOSIS — I5189 Other ill-defined heart diseases: Secondary | ICD-10-CM | POA: Diagnosis not present

## 2023-02-23 DIAGNOSIS — G4730 Sleep apnea, unspecified: Secondary | ICD-10-CM | POA: Diagnosis not present

## 2023-02-23 NOTE — Progress Notes (Signed)
ID:  Janice [name], DOB [birth year], MRN [id number]  PCP:  Chilton Greathouse, MD  Cardiologist:  Tessa Lerner, DO, [facility] (established care 02/23/23) Former Cardiology Providers: Dr. Truett Mainland, [place]vin [place], [place]  Date: 02/23/23 Last Office Visit: February 17, 2022  Chief Complaint  Patient presents with   Asymptomatic stenosis of left carotid artery   Follow-up   Results    HPI  Janice [name] is a 72 y.o. Caucasian female whose past medical history and cardiovascular risk factors include: Hypertension, diabetes mellitus type 2, aortic regurgitation, carotid stenosis, sleep apnea in adult, liver cirrhosis, chronic lymphocytic lymphoma, anemia, postmenopausal female.  Patient is being followed by the practice for carotid disease.  I am seeing her for the first time today to reestablish care.  She was under the care of of [place], [place] and Dr. Truett Mainland in the past.  Since her last office visit patient denies anginal chest pain or heart failure symptoms.  Her dyspnea on exertion is chronic and stable and multifactorial.  She had a hernia surgery in February 2024 which she has recovered well and had not experienced cardiovascular complications.  Her last carotid duplex noted bilateral carotid artery stenosis and she is due for repeat duplex.  She is currently on statin and aspirin therapy.  She denies any focal neurological deficits or vision changes.   FUNCTIONAL STATUS: Water exercise once a week. Stationary bike once a week at the [facility].  CARDIAC DATABASE: EKG: February 23, 2023: Sinus rhythm, 73 bpm, baseline artifact, poor R wave progression  Echocardiogram: 11/08/2021: Normal LV systolic function with visual EF 60-65%. Left ventricle cavity is normal in size. No obvious regional wall motion abnormalities. Normal left ventricular wall thickness. Normal global wall motion. Doppler evidence of grade II (pseudonormal) diastolic dysfunction, elevated LAP. Mild  (Grade I) aortic regurgitation. Mild (Grade I) mitral regurgitation. Mild tricuspid regurgitation. Mild pulmonary hypertension. RVSP measures 41 mmHg. Compared to study 02/03/2021 no significant change.  Stress Testing: Lexiscan Tetrofosmin stress test 11/08/2021: Lexiscan nuclear stress test performed using 1-day protocol. Decreased tracer uptake in inferior myocardium, more prominent in rest images. In absence of any associated wall motion and thickening abnormality, this most likely represents breast tissue attenuation, with imaging performed in sitting position. Stress LVEF 72%. Low risk study.  Heart Catheterization: None  Carotid duplex: Carotid artery duplex 02/01/2022:  Duplex suggests stenosis in the right internal carotid artery (50-69%).  Duplex suggests stenosis in the right external carotid artery (<50%).  Duplex suggests stenosis in the left internal carotid artery (50-69%).  Duplex suggests stenosis in the left external carotid artery (<50%).  Compared to 02/03/2021, there is progression of stenosis severity in bilateral carotid arteries. Follow up in six months is appropriate if  clinically indicated.     ALLERGIES: Allergies  Allergen Reactions   Benzonatate Anaphylaxis, Hives and Other (See Comments)   Fluticasone Furoate-Vilanterol Anaphylaxis and Hives    Other reaction(s): hives  breo ellipta   Iodine Hives, Swelling and Other (See Comments)    And Betadine/caused hives and swelling   Latex Hives, Itching, Rash and Other (See Comments)    Burning   Simvastatin Other (See Comments)    Caused neuropathy in arms Neuropathy in arms   Bupropion Other (See Comments)    Negative thoughts. Crazy thoughts   Levofloxacin Other (See Comments)    Joint aches//leg, shoulder pain Joint pain   Penicillins Hives   Povidone-Iodine Hives   Amoxicillin Hives    Blisters Did  it involve swelling of the face/tongue/throat, SOB, or low BP? Yes Did it involve sudden or  severe rash/hives, skin peeling, or any reaction on the inside of your mouth or nose? Yes Did you need to seek medical attention at a hospital or doctor's office?Unknown When did it last happen? 2019      If all above answers are "NO", may proceed with cephalosporin use.    Chlorhexidine Itching   Chlorthalidone Other (See Comments)    Ears ringing    Diflucan [Fluconazole] Hives and Other (See Comments)    blister   Povidone Iodine    Amoxicillin-Pot Clavulanate Rash   Doxycycline Itching and Diarrhea    MEDICATION LIST PRIOR TO VISIT: Current Meds  Medication Sig   albuterol (VENTOLIN HFA) 108 (90 Base) MCG/ACT inhaler Inhale 1-2 puffs into the lungs every 6 (six) hours as needed (Cough).   amLODipine (NORVASC) 10 MG tablet Take 1 tablet (10 mg total) by mouth daily.   aspirin EC 81 MG tablet Take 1 tablet (81 mg total) by mouth daily. Swallow whole.   azelastine (ASTELIN) 0.1 % nasal spray Place 1 spray into both nostrils 2 (two) times daily. Use in each nostril as directed   b complex vitamins capsule Take 1 capsule by mouth daily.   bacitracin 500 UNIT/GM ointment Apply topically 2 (two) times daily for 10 days   budesonide-formoterol (SYMBICORT) 80-4.5 MCG/ACT inhaler Inhale 2 puffs into the lungs daily. (Patient taking differently: Inhale 2 puffs into the lungs 2 (two) times daily. Patient usually does 2 puffs in morning and 1 puff in evening.)   CALCIUM PO Take 1,000 mg by mouth every morning. 500 mg each   Carboxymethylcellul-Glycerin (REFRESH OPTIVE PF OP) Place 1 drop into both eyes daily as needed (dry eyes). Non-pres   cephALEXin (KEFLEX) 500 MG capsule Take 2 capsules (1,000 mg total) by mouth as directed 1 hour prior to dental work.   cetirizine (ZYRTEC) 10 MG tablet Take 10 mg by mouth daily.   Coenzyme Q10 (CO Q-10) 100 MG CAPS Take 100 mg by mouth daily.   Continuous Blood Gluc Sensor (FREESTYLE LIBRE 2 SENSOR) MISC Use to monitor blood glucose continuously - change  every 14 days.   desoximetasone (TOPICORT) 0.25 % cream Apply a small amount to skin twice a day as needed for flares   diclofenac sodium (VOLTAREN) 1 % GEL Apply 2 g topically 2 (two) times daily as needed (knee pain/ foot /Triger finger).   fluticasone (FLONASE) 50 MCG/ACT nasal spray Place 2 sprays into both nostrils in the morning. (Patient taking differently: Place 2 sprays into both nostrils 2 (two) times daily.)   furosemide (LASIX) 20 MG tablet Take 1 tablet (20 mg total) by mouth daily.   gabapentin (NEURONTIN) 300 MG capsule Take 1 capsule by mouth twice a day (Patient taking differently: Take 600 mg by mouth at bedtime.)   glucose blood test strip Use to test blood sugar 3 times daily as directed   hydrALAZINE (APRESOLINE) 25 MG tablet Take 1 tablet (25 mg total) by mouth 2 (two) times daily.   insulin aspart (NOVOLOG FLEXPEN) 100 UNIT/ML FlexPen Inject 30 Units into the skin 3 (three) times daily with meals.   insulin degludec (TRESIBA FLEXTOUCH) 200 UNIT/ML FlexTouch Pen Inject 80 Units into the skin daily.   Insulin Pen Needle (BD PEN NEEDLE NANO 2ND GEN) 32G X 4 MM MISC Use as directed 6 times daily   irbesartan (AVAPRO) 300 MG tablet Take 1 tablet (  300 mg total) by mouth in the morning.   Lancets (ONETOUCH DELICA PLUS LANCET33G) MISC Use to test blood sugar 3 times daily   LORazepam (ATIVAN) 0.5 MG tablet Take 1-2 tablets (0.5-1 mg total) by mouth once as needed for up to 1 dose (prior to MRI or CT scan for claustrophobia).   metFORMIN (GLUCOPHAGE-XR) 500 MG 24 hr tablet Take 2 tablets (1,000 mg total) by mouth 2 (two) times daily.   mometasone-formoterol (DULERA) 100-5 MCG/ACT AERO Take 2 puffs first thing in am and then another 2 puffs about 12 hours later.   omeprazole (PRILOSEC) 40 MG capsule Take 1 capsule by mouth twice daily   ondansetron (ZOFRAN-ODT) 4 MG disintegrating tablet Take 1 tablet (4 mg total) by mouth every 8 (eight) hours as needed for nausea or vomiting.    rosuvastatin (CRESTOR) 10 MG tablet Take 1 tablet (10 mg total) by mouth daily. (Patient taking differently: Take 10 mg by mouth at bedtime.)   sertraline (ZOLOFT) 50 MG tablet Take 1 tablet (50 mg total) by mouth daily.   terconazole (TERAZOL 3) 0.8 % vaginal cream Place 1 applicator vaginally daily as needed (yeast infections).     PAST MEDICAL HISTORY: Past Medical History:  Diagnosis Date   Allergy    Asthma    seasonal   Cellulitis 2013   Left toe   Chronic kidney disease    stage 1   Cirrhosis (HCC)    Common migraine    History of   Complication of anesthesia    Per pt/had breathing problems with "block" during rotator cuff surgery. 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   Coronary artery disease    DDD (degenerative disc disease)    Depression    denies takes paxil for migraines   Diabetes mellitus    type 2   Diabetic peripheral neuropathy (HCC)    Diverticulitis    Diverticulosis    DJD (degenerative joint disease)    Esophageal varices (HCC)    GERD (gastroesophageal reflux disease)    History of colon polyps    hyperplastic   History of gastric polyp    Hyperlipidemia    Hypertension    Iron deficiency anemia    Obesity    OSA on CPAP    cpap   Peripheral neuropathy    Pneumonia    april 2020  mild   Primary localized osteoarthritis of left knee 03/27/2019   Sensorineural hearing loss    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HCC)     PAST SURGICAL HISTORY: Past Surgical History:  Procedure Laterality Date   ANKLE SURGERY     Left    BREAST BIOPSY Left 2021   left axilla, lymphoma   COLONOSCOPY  05/2018   LEEP N/A 09/14/2018   Procedure: LOOP ELECTROSURGICAL EXCISION PROCEDURE (LEEP);  Surgeon: Richardean Chimera, MD;  Location: [facility];  Service: Gynecology;  Laterality: N/A;   MASS EXCISION Right 06/24/2021   Procedure: EXCISION RIGHT POSTERIOR NECK MASS;  Surgeon: Kinsinger, De Blanch, MD;  Location: [facility];  Service: General;  Laterality:  Right;   ROTATOR CUFF REPAIR Bilateral 2012, 2015   TONSILLECTOMY     TOTAL KNEE ARTHROPLASTY Left 04/08/2019   Procedure: TOTAL KNEE ARTHROPLASTY;  Surgeon: Salvatore Marvel, MD;  Location: [facility];  Service: Orthopedics;  Laterality: Left;   UMBILICAL HERNIA REPAIR N/A 10/20/2022   Procedure: OPEN HERNIA REPAIR UMBILICAL ADULT with Mesh;  Surgeon: Kinsinger, De Blanch, MD;  Location: [facility];  Service: General;  Laterality: N/A;   UPPER GI ENDOSCOPY  05/2018    FAMILY HISTORY: The patient family history includes Bone cancer in her mother; Breast cancer in her mother; Diabetes Mellitus II in her brother, father, and sister; Heart failure in her father; Hypertension in her brother and father; Obesity in her sister; Other in her sister.  SOCIAL HISTORY:  The patient  reports that she has never smoked. She has been exposed to tobacco smoke. She has never used smokeless tobacco. She reports that she does not currently use alcohol. She reports that she does not use drugs.  REVIEW OF SYSTEMS: Review of Systems  Constitutional: Positive for malaise/fatigue (chronic).  Cardiovascular:  Positive for dyspnea on exertion (chronic) and palpitations. Negative for chest pain, claudication, irregular heartbeat, leg swelling, near-syncope, orthopnea, paroxysmal nocturnal dyspnea and syncope.  Respiratory:  Negative for shortness of breath.   Hematologic/Lymphatic: Negative for bleeding problem.  Musculoskeletal:  Negative for muscle cramps and myalgias.  Neurological:  Negative for dizziness and light-headedness.    PHYSICAL EXAM:    02/23/2023   12:59 PM 02/22/2023   11:58 AM 01/27/2023    1:00 PM  Vitals with BMI  Height 5\' 4"  5\' 4"    Weight 221 lbs 222 lbs   BMI 37.92 38.09   Systolic 140 126 540  Diastolic 55 33 63  Pulse 78 72 76    Physical Exam  Constitutional: No distress.  Age appropriate, hemodynamically stable.   Neck: No JVD present.  Cardiovascular: Normal rate, regular rhythm, S1  normal, S2 normal, intact distal pulses and normal pulses. Exam reveals no gallop, no S3 and no S4.  No murmur heard. Pulses:      Carotid pulses are  on the right side with bruit and  on the left side with bruit. Pulmonary/Chest: Effort normal and breath sounds normal. No stridor. She has no wheezes. She has no rales.  Abdominal: Soft. Bowel sounds are normal. She exhibits no distension. There is no abdominal tenderness.  Musculoskeletal:        General: No edema.     Cervical back: Neck supple.  Neurological: She is alert and oriented to person, place, and time. She has intact cranial nerves (2-12).  Skin: Skin is warm and moist.   LABORATORY DATA:    Latest Ref Rng & Units 02/22/2023   11:27 AM 12/02/2022   11:33 AM 10/20/2022   11:48 AM  CBC  WBC 4.0 - 10.5 K/uL 54.2  42.0  56.9   Hemoglobin 12.0 - 15.0 g/dL 9.1  98.1  19.1   Hematocrit 36.0 - 46.0 % 29.1  34.8  34.9   Platelets 150 - 400 K/uL 93  112  108        Latest Ref Rng & Units 02/22/2023   11:27 AM 12/19/2022   12:47 PM 12/12/2022    5:04 PM  CMP  Glucose 70 - 99 mg/dL 478  295  621   BUN 8 - 23 mg/dL 19  17  12    Creatinine 0.44 - 1.00 mg/dL 3.08  6.57  8.46   Sodium 135 - 145 mmol/L 138  138  132   Potassium 3.5 - 5.1 mmol/L 4.5  4.2  4.3   Chloride 98 - 111 mmol/L 105  105  103   CO2 22 - 32 mmol/L 26  25  24    Calcium 8.9 - 10.3 mg/dL 9.0  9.3  8.7   Total Protein 6.5 - 8.1 g/dL 6.6  Total Bilirubin 0.3 - 1.2 mg/dL 0.4     Alkaline Phos 38 - 126 U/L 64     AST 15 - 41 U/L 21     ALT 0 - 44 U/L 17       Lab Results  Component Value Date   CHOL 125 10/18/2021   HDL 43 10/18/2021   LDLCALC 65 10/18/2021   TRIG 86 10/18/2021   No components found for: "NTPROBNP" No results for input(s): "PROBNP" in the last 8760 hours. No results for input(s): "TSH" in the last 8760 hours.  BMP Recent Labs    10/20/22 1148 12/02/22 1133 12/12/22 1704 12/19/22 1247 02/22/23 1127  NA  --  138 132* 138 138  K  --   4.5 4.3 4.2 4.5  CL  --  107 103 105 105  CO2  --  26 24 25 26   GLUCOSE  --  232* 194* 116* 203*  BUN  --  12 12 17 19   CREATININE 0.81 0.79 0.90 0.92 0.84  CALCIUM  --  8.9 8.7 9.3 9.0  GFRNONAA >60 >60  --   --  >60    HEMOGLOBIN A1C Lab Results  Component Value Date   HGBA1C 7.5 (H) 10/03/2022   MPG 168.55 10/03/2022    IMPRESSION:    ICD-10-CM   1. Asymptomatic bilateral carotid artery stenosis  I65.23 EKG 12-Lead    PCV CAROTID DUPLEX (BILATERAL)    2. Diastolic dysfunction  I51.89     3. Dyspnea on exertion  R06.09     4. Essential hypertension  I10     5. Nonrheumatic aortic valve insufficiency  I35.1     6. Sleep apnea in adult  G47.30        RECOMMENDATIONS: Janice [name] is a 72 y.o. Caucasian female whose past medical history and cardiac risk factors include: Hypertension, diabetes mellitus type 2, aortic regurgitation, carotid stenosis, sleep apnea in adult, liver cirrhosis, chronic lymphocytic lymphoma, anemia, postmenopausal female.  Asymptomatic bilateral carotid artery stenosis As of May 2023 carotid duplex notes bilateral ICA disease <70%. Recommend repeat carotid duplex to evaluate for disease progression. Patient stated she may have to postpone carotid duplex due to painful cervical lymphadenopathy.  She is scheduled to undergo chemotherapy for her lymphocytic lymphoma which should decrease the pain and she may arrange the duplex thereafter. Remains on aspirin 81 mg p.o. daily. Currently on Crestor 10 mg p.o. nightly Patient recently had labs checked with PCP and she will arrange it copy for reference.   Recommend a goal LDL at least <70 mg/dL  Diastolic dysfunction Currently euvolemic and not in overt heart failure. Prior echocardiography noted concerns for diastolic dysfunction, elevated left atrial pressure, elevated RVSP.  Recommend better BP and volume management.  She is currently on diuretic therapy which includes spironolactone and  furosemide. Medications reconciled. Recommended either Jardiance or Farxiga to help facilitate diuresis and given her comorbid conditions.  However, she would like to discuss with PCP prior to initiating therapy.   Dyspnea on exertion Multifactorial: Cirrhosis, anemia, diastolic dysfunction, and possible pulmonary etiology. She currently follows up regularly for the management of cirrhosis and currently on spironolactone/Lasix. Will defer workup of anemia to PCP and gastroenterology. Management of diastolic dysfunction as noted above  Essential hypertension Office blood pressures are not at goal. Recommend better blood pressure management. Reemphasized importance of a low-salt diet.   FINAL MEDICATION LIST END OF ENCOUNTER: No orders of the defined types were placed in this encounter.  There are no discontinued medications.   Current Outpatient Medications:    albuterol (VENTOLIN HFA) 108 (90 Base) MCG/ACT inhaler, Inhale 1-2 puffs into the lungs every 6 (six) hours as needed (Cough)., Disp: 18 g, Rfl: 0   amLODipine (NORVASC) 10 MG tablet, Take 1 tablet (10 mg total) by mouth daily., Disp: 90 tablet, Rfl: 3   aspirin EC 81 MG tablet, Take 1 tablet (81 mg total) by mouth daily. Swallow whole., Disp: 90 tablet, Rfl: 3   azelastine (ASTELIN) 0.1 % nasal spray, Place 1 spray into both nostrils 2 (two) times daily. Use in each nostril as directed, Disp: 9 mL, Rfl: 11   b complex vitamins capsule, Take 1 capsule by mouth daily., Disp: , Rfl:    bacitracin 500 UNIT/GM ointment, Apply topically 2 (two) times daily for 10 days, Disp: 30 g, Rfl: 0   budesonide-formoterol (SYMBICORT) 80-4.5 MCG/ACT inhaler, Inhale 2 puffs into the lungs daily. (Patient taking differently: Inhale 2 puffs into the lungs 2 (two) times daily. Patient usually does 2 puffs in morning and 1 puff in evening.), Disp: 10.2 g, Rfl: 12   CALCIUM PO, Take 1,000 mg by mouth every morning. 500 mg each, Disp: , Rfl:     Carboxymethylcellul-Glycerin (REFRESH OPTIVE PF OP), Place 1 drop into both eyes daily as needed (dry eyes). Non-pres, Disp: , Rfl:    cephALEXin (KEFLEX) 500 MG capsule, Take 2 capsules (1,000 mg total) by mouth as directed 1 hour prior to dental work., Disp: 20 capsule, Rfl: 0   cetirizine (ZYRTEC) 10 MG tablet, Take 10 mg by mouth daily., Disp: , Rfl:    Coenzyme Q10 (CO Q-10) 100 MG CAPS, Take 100 mg by mouth daily., Disp: , Rfl:    Continuous Blood Gluc Sensor (FREESTYLE LIBRE 2 SENSOR) MISC, Use to monitor blood glucose continuously - change every 14 days., Disp: 6 each, Rfl: 3   desoximetasone (TOPICORT) 0.25 % cream, Apply a small amount to skin twice a day as needed for flares, Disp: 60 g, Rfl: 0   diclofenac sodium (VOLTAREN) 1 % GEL, Apply 2 g topically 2 (two) times daily as needed (knee pain/ foot /Triger finger)., Disp: , Rfl: 2   fluticasone (FLONASE) 50 MCG/ACT nasal spray, Place 2 sprays into both nostrils in the morning. (Patient taking differently: Place 2 sprays into both nostrils 2 (two) times daily.), Disp: 48 g, Rfl: 2   furosemide (LASIX) 20 MG tablet, Take 1 tablet (20 mg total) by mouth daily., Disp: 90 tablet, Rfl: 3   gabapentin (NEURONTIN) 300 MG capsule, Take 1 capsule by mouth twice a day (Patient taking differently: Take 600 mg by mouth at bedtime.), Disp: 180 capsule, Rfl: 3   glucose blood test strip, Use to test blood sugar 3 times daily as directed, Disp: 300 each, Rfl: 3   hydrALAZINE (APRESOLINE) 25 MG tablet, Take 1 tablet (25 mg total) by mouth 2 (two) times daily., Disp: 180 tablet, Rfl: 2   insulin aspart (NOVOLOG FLEXPEN) 100 UNIT/ML FlexPen, Inject 30 Units into the skin 3 (three) times daily with meals., Disp: 30 mL, Rfl: 11   insulin degludec (TRESIBA FLEXTOUCH) 200 UNIT/ML FlexTouch Pen, Inject 80 Units into the skin daily., Disp: 30 mL, Rfl: 11   Insulin Pen Needle (BD PEN NEEDLE NANO 2ND GEN) 32G X 4 MM MISC, Use as directed 6 times daily, Disp: 400  each, Rfl: 5   irbesartan (AVAPRO) 300 MG tablet, Take 1 tablet (300 mg total) by mouth in  the morning., Disp: 90 tablet, Rfl: 3   Lancets (ONETOUCH DELICA PLUS LANCET33G) MISC, Use to test blood sugar 3 times daily, Disp: 300 each, Rfl: 2   LORazepam (ATIVAN) 0.5 MG tablet, Take 1-2 tablets (0.5-1 mg total) by mouth once as needed for up to 1 dose (prior to MRI or CT scan for claustrophobia)., Disp: 4 tablet, Rfl: 0   metFORMIN (GLUCOPHAGE-XR) 500 MG 24 hr tablet, Take 2 tablets (1,000 mg total) by mouth 2 (two) times daily., Disp: 360 tablet, Rfl: 3   mometasone-formoterol (DULERA) 100-5 MCG/ACT AERO, Take 2 puffs first thing in am and then another 2 puffs about 12 hours later., Disp: 13 g, Rfl: 11   omeprazole (PRILOSEC) 40 MG capsule, Take 1 capsule by mouth twice daily, Disp: 180 capsule, Rfl: 3   ondansetron (ZOFRAN-ODT) 4 MG disintegrating tablet, Take 1 tablet (4 mg total) by mouth every 8 (eight) hours as needed for nausea or vomiting., Disp: 20 tablet, Rfl: 0   rosuvastatin (CRESTOR) 10 MG tablet, Take 1 tablet (10 mg total) by mouth daily. (Patient taking differently: Take 10 mg by mouth at bedtime.), Disp: 90 tablet, Rfl: 2   sertraline (ZOLOFT) 50 MG tablet, Take 1 tablet (50 mg total) by mouth daily., Disp: 90 tablet, Rfl: 3   terconazole (TERAZOL 3) 0.8 % vaginal cream, Place 1 applicator vaginally daily as needed (yeast infections)., Disp: , Rfl:    Insulin Pen Needle (BD PEN NEEDLE MICRO U/F) 32G X 6 MM MISC, Use as directed 6 times daily, Disp: 400 each, Rfl: 3   spironolactone (ALDACTONE) 50 MG tablet, Take 1 tablet (50 mg total) by mouth daily., Disp: 90 tablet, Rfl: 3  Orders Placed This Encounter  Procedures   EKG 12-Lead   PCV CAROTID DUPLEX (BILATERAL)    There are no Patient Instructions on file for this visit.   --Continue cardiac medications as reconciled in final medication list. --Return in about 6 weeks (around 04/06/2023) for Follow up, Carotid disease. or sooner  if needed. --Continue follow-up with your primary care physician regarding the management of your other chronic comorbid conditions.  Patient's questions and concerns were addressed to her satisfaction. She voices understanding of the instructions provided during this encounter.   This note was created using a voice recognition software as a result there may be grammatical errors inadvertently enclosed that do not reflect the nature of this encounter. Every attempt is made to correct such errors.  Tessa Lerner, [place], [facility]  Pager:  [phone] Office: [phone]

## 2023-02-23 NOTE — Progress Notes (Signed)
[facility] Clinical Social Work  Initial Assessment   Kalleigh Harbor is a 72 y.o. year old female contacted by phone. Clinical Social Work was referred by medical provider for assessment of psychosocial needs.   SDOH (Social Determinants of Health) assessments performed: Yes SDOH Interventions    Flowsheet Row Social Work from 08/20/2020 in [facility] [place]  SDOH Interventions   Food Insecurity Interventions Intervention Not Indicated  Housing Interventions Intervention Not Indicated  Transportation Interventions Intervention Not Indicated  Financial Strain Interventions --  [husbands medical expenses are concerning to patient]  Social Connections Interventions Intervention Not Indicated       SDOH Screenings   Food Insecurity: No Food Insecurity (10/20/2022)  Housing: Low Risk  (10/20/2022)  Transportation Needs: No Transportation Needs (10/20/2022)  Utilities: Not At Risk (10/20/2022)  Financial Resource Strain: Medium Risk (08/20/2020)  Social Connections: Socially Integrated (08/20/2020)  Tobacco Use: Low Risk  (12/12/2022)     Distress Screen completed: No    08/20/2020   10:41 AM  ONCBCN DISTRESS SCREENING  Screening Type Initial Screening  Distress experienced in past week (1-10) 8  Family Problem type Partner  Information Concerns Type Lack of info about diagnosis  Referral to clinical social work Yes      Family/Social Information:  Housing Arrangement: patient lives alone Family members/support persons in your life? Pt's sister recently retired and resides about 2 1/2 miles away.  Pt's brother resides close to [place].  Pt's sister predominantly provides any support pt needs. Transportation concerns: no  Employment: Retired .  Income source: Actor concerns:  Pt retired earlier than intended due to health issues and is concerned her savings will run out prematurely Type of concern: Medical bills Food  access concerns: no Religious or spiritual practice: Yes-Christian Services Currently in place:  none  Coping/ Adjustment to diagnosis: Patient understands treatment plan and what happens next? yes Concerns about diagnosis and/or treatment: Overwhelmed by information, How will I care for myself, and Quality of life Patient reported stressors: Finances, Anxiety/ nervousness, and Adjusting to my illness Hopes and/or priorities: Pt's priority is to continue treatment w/ the hope of positive results Patient enjoys gardening Current coping skills/ strengths: Capable of independent living , Motivation for treatment/growth , and Supportive family/friends     SUMMARY: Current SDOH Barriers:  Financial constraints related to fixed income and limited savings  Clinical Social Work Clinical Goal(s):  Explore community resource options for unmet needs related to:  Financial Strain   Interventions: Discussed common feeling and emotions when being diagnosed with cancer, and the importance of support during treatment Informed patient of the support team roles and support services at [facility] Provided CSW contact information and encouraged patient to call with any questions or concerns Referred patient to [employer] to explore applying for Medicaid.  Provided pt with the link to the leukemia and lymphoma society to explore grants she may be eligible.  Encouraged pt to reach out to the [employer] [place] to explore programs she may be eligible.     Follow Up Plan: Patient will contact CSW with any support or resource needs Patient verbalizes understanding of plan: Yes    Rachel Moulds, LCSW Clinical Social Worker [facility]

## 2023-02-24 ENCOUNTER — Inpatient Hospital Stay: Payer: PPO

## 2023-02-24 ENCOUNTER — Other Ambulatory Visit: Payer: Self-pay

## 2023-02-24 VITALS — BP 145/56 | HR 69 | Temp 98.2°F | Resp 17 | Ht 64.0 in | Wt 221.1 lb

## 2023-02-24 DIAGNOSIS — D801 Nonfamilial hypogammaglobulinemia: Secondary | ICD-10-CM

## 2023-02-24 DIAGNOSIS — C8300 Small cell B-cell lymphoma, unspecified site: Secondary | ICD-10-CM

## 2023-02-24 MED ORDER — METHYLPREDNISOLONE SODIUM SUCC 40 MG IJ SOLR
40.0000 mg | Freq: Every day | INTRAMUSCULAR | Status: DC
  Administered 2023-02-24: 40 mg via INTRAVENOUS
  Filled 2023-02-24: qty 1

## 2023-02-24 MED ORDER — DEXTROSE 5 % IV SOLN: INTRAVENOUS | Status: DC

## 2023-02-24 MED ORDER — DEXTROSE-SODIUM CHLORIDE 5-0.45 % IV SOLN: INTRAVENOUS | Status: DC

## 2023-02-24 MED ORDER — DIPHENHYDRAMINE HCL 25 MG PO CAPS
25.0000 mg | ORAL_CAPSULE | Freq: Once | ORAL | Status: AC
  Administered 2023-02-24: 25 mg via ORAL
  Filled 2023-02-24: qty 1

## 2023-02-24 MED ORDER — IMMUNE GLOBULIN (HUMAN) 10 GM/100ML IV SOLN
400.0000 mg/kg | INTRAVENOUS | Status: DC
  Administered 2023-02-24: 40 g via INTRAVENOUS
  Filled 2023-02-24: qty 400

## 2023-02-24 MED ORDER — ACETAMINOPHEN 325 MG PO TABS
650.0000 mg | ORAL_TABLET | Freq: Once | ORAL | Status: AC
  Administered 2023-02-24: 650 mg via ORAL
  Filled 2023-02-24: qty 2

## 2023-02-27 ENCOUNTER — Encounter: Payer: Self-pay | Admitting: Hematology

## 2023-02-27 ENCOUNTER — Other Ambulatory Visit: Payer: Self-pay | Admitting: Hematology

## 2023-02-27 ENCOUNTER — Other Ambulatory Visit (HOSPITAL_COMMUNITY): Payer: Self-pay | Admitting: *Deleted

## 2023-02-27 ENCOUNTER — Telehealth (HOSPITAL_COMMUNITY): Payer: Self-pay | Admitting: *Deleted

## 2023-02-27 DIAGNOSIS — K7460 Unspecified cirrhosis of liver: Secondary | ICD-10-CM | POA: Diagnosis not present

## 2023-02-27 DIAGNOSIS — K760 Fatty (change of) liver, not elsewhere classified: Secondary | ICD-10-CM | POA: Diagnosis not present

## 2023-02-27 DIAGNOSIS — D801 Nonfamilial hypogammaglobulinemia: Secondary | ICD-10-CM | POA: Diagnosis not present

## 2023-02-27 DIAGNOSIS — F329 Major depressive disorder, single episode, unspecified: Secondary | ICD-10-CM | POA: Diagnosis not present

## 2023-02-27 DIAGNOSIS — R221 Localized swelling, mass and lump, neck: Secondary | ICD-10-CM | POA: Diagnosis not present

## 2023-02-27 DIAGNOSIS — I119 Hypertensive heart disease without heart failure: Secondary | ICD-10-CM | POA: Diagnosis not present

## 2023-02-27 DIAGNOSIS — C8300 Small cell B-cell lymphoma, unspecified site: Secondary | ICD-10-CM | POA: Diagnosis not present

## 2023-02-27 DIAGNOSIS — E1140 Type 2 diabetes mellitus with diabetic neuropathy, unspecified: Secondary | ICD-10-CM | POA: Diagnosis not present

## 2023-02-27 DIAGNOSIS — D509 Iron deficiency anemia, unspecified: Secondary | ICD-10-CM | POA: Diagnosis not present

## 2023-02-27 DIAGNOSIS — G629 Polyneuropathy, unspecified: Secondary | ICD-10-CM | POA: Diagnosis not present

## 2023-02-27 DIAGNOSIS — R1310 Dysphagia, unspecified: Secondary | ICD-10-CM

## 2023-02-27 DIAGNOSIS — R188 Other ascites: Secondary | ICD-10-CM | POA: Diagnosis not present

## 2023-02-27 MED ORDER — LORAZEPAM 0.5 MG PO TABS
0.5000 mg | ORAL_TABLET | Freq: Once | ORAL | 0 refills | Status: DC | PRN
  Filled 2023-02-27: qty 4, 2d supply, fill #0

## 2023-02-27 NOTE — Telephone Encounter (Signed)
Attempted to contact patient about scheduling OP MBS. Left VM. RKEEL

## 2023-02-28 ENCOUNTER — Encounter: Payer: Self-pay | Admitting: Internal Medicine

## 2023-02-28 ENCOUNTER — Ambulatory Visit: Payer: PPO | Admitting: Internal Medicine

## 2023-02-28 ENCOUNTER — Other Ambulatory Visit: Payer: Self-pay

## 2023-02-28 ENCOUNTER — Other Ambulatory Visit (HOSPITAL_COMMUNITY): Payer: Self-pay

## 2023-02-28 ENCOUNTER — Other Ambulatory Visit (INDEPENDENT_AMBULATORY_CARE_PROVIDER_SITE_OTHER): Payer: PPO

## 2023-02-28 VITALS — BP 110/40 | HR 76 | Ht 64.0 in | Wt 221.0 lb

## 2023-02-28 DIAGNOSIS — R1310 Dysphagia, unspecified: Secondary | ICD-10-CM | POA: Diagnosis not present

## 2023-02-28 DIAGNOSIS — K7460 Unspecified cirrhosis of liver: Secondary | ICD-10-CM | POA: Diagnosis not present

## 2023-02-28 DIAGNOSIS — D509 Iron deficiency anemia, unspecified: Secondary | ICD-10-CM

## 2023-02-28 DIAGNOSIS — K7581 Nonalcoholic steatohepatitis (NASH): Secondary | ICD-10-CM

## 2023-02-28 DIAGNOSIS — R011 Cardiac murmur, unspecified: Secondary | ICD-10-CM

## 2023-02-28 DIAGNOSIS — K649 Unspecified hemorrhoids: Secondary | ICD-10-CM | POA: Diagnosis not present

## 2023-02-28 LAB — PROTIME-INR
INR: 1.1 ratio — ABNORMAL HIGH (ref 0.8–1.0)
Prothrombin Time: 12.1 s (ref 9.6–13.1)

## 2023-02-28 NOTE — Patient Instructions (Signed)
You have been scheduled for an endoscopy. Please follow written instructions given to you at your visit today. If you use inhalers (even only as needed), please bring them with you on the day of your procedure.  Your provider has requested that you go to the basement level for lab work before leaving today. Press "B" on the elevator. The lab is located at the first door on the left as you exit the elevator.  I appreciate the opportunity to care for you. Stan Head, MD, [facility]   ORAL DIABETIC MEDICATION INSTRUCTIONS (Metformin, Glipizide, Glimepiride, Farxiga, [place], [place], [place], [place], [place])   The day before your procedure:  Take your diabetic pill as you do normally  The day of your procedure:  Do not take your diabetic pill   We will check your blood sugar levels during the admission process and again in Recovery before discharging you home  _____________________________________________________________________________  INSULIN (LONG ACTING) MEDICATION INSTRUCTIONS (Lantus, Levemir, Basaglar, NPH, 70/30, Humulin, Novolin-N, Toujeo,Tresiba, [place], [place])  The day before your procedure:  Take  your regular daily dose   The day of your procedure:  Do not take your morning dose We will check your blood sugar levels during the admission process and again in Recovery before discharging you home    INSULIN (SHORT ACTING) MEDICATION INSTRUCTIONS (Regular, Humulog, Novolog,Novolin-R)  The day before your procedure:  Do not take your evening dose  The day of your procedure:  Do not take your morning dose We will check your blood sugar levels during the admission process and again in Recovery before discharging you home      ONCE A DAY INJECTIONS Byetta,Victoza,Saxenda,Adlyxin- DO NOT TAKE 1 day prior to the procedure

## 2023-02-28 NOTE — H&P (View-Only) (Signed)
 Janice [name] 72 y.o. 07/14/1951 [id number]  Assessment & Plan:   Encounter Diagnoses  Name Primary?   Liver cirrhosis secondary to NASH (nonalcoholic steatohepatitis) (HCC) Yes   Bleeding hemorrhoids    Iron deficiency anemia, unspecified iron deficiency anemia type    Dysphagia, unspecified type    Heart murmur, systolic    Orders Placed This Encounter  Procedures   Protime-INR   AFP tumor marker   Ambulatory referral to Gastroenterology   Will plan for EGD to reassess for possible varices, gastric polyps that I thought were contributing to anemia in the past.  Look for portal gastropathy also.  It has been 2 years since she has had an EGD.  Having PET scan, we will follow-up on that as well.  I think it is okay for her to take Jardiance from a gastrointestinal/liver perspective.  She is on quite a few medications already.  I heard heart murmur in the right upper sternal border today.  Her last echocardiogram was in 2022.  She has bruits on the right and left neck though I did not listen there today cardiology review of visit on 02/23/2023 shows that.  The cardiologist did not hear a murmur.  She does have some valvular disease is on echocardiogram last year though I do not think those would fit with a right upper sternal border murmur.  I will message cardiology and let the patient know that I am doing so.  Sounds like a repeat echocardiogram would be reasonable.  Echocardiogram 11/08/2021: Normal LV systolic function with visual EF 60-65%. Left ventricle cavity is normal in size. No obvious regional wall motion abnormalities. Normal left ventricular wall thickness. Normal global wall motion. Doppler evidence of grade II (pseudonormal) diastolic dysfunction, elevated LAP. Mild (Grade I) aortic regurgitation. Mild (Grade I) mitral regurgitation. Mild tricuspid regurgitation. Mild pulmonary hypertension. RVSP measures 41 mmHg. Compared to study 02/03/2021 no significant  change.   Lab Results  Component Value Date   INR 1.1 (H) 02/28/2023   INR 1.1 (H) 08/26/2022   INR 1.1 (H) 05/06/2021    CC: Avva, Ravisankar, MD Dr. Sunit Tolia  -----------------------------------------------------------------------------  Gastroenterology summary:  MASH (NASH) cirrhosis diagnosed 2021 trace ascites at the time no encephalopathy no varices last EGD 2022-hyperplastic polypoid changes in the antrum.  On low-dose spironolactone.  She is not immune to hepatitis A or B and has not been vaccinated   Husband died from liver failure due to NASH  Swelling in fluid retention postop after feb 24 hernia repair treated with increase diuretics now back to prior dose US stable liver cirrhsosi and tr ascites nolesion   Question of liver lesions-last MRI 02/07/2022 not seen, does have splenomegaly with hypoenhancing splenic lesions stable history of lymphoma with multiple lymph nodes noted on MRI.  PET scan October 2023 without liver lesions   History of adenomatous colonic polyps 2012-2 cm pedunculated tubulovillous adenoma removed, complicated by post polypectomy bleed clipped 01/29/2021 08/01/2016 3 adenomas max 6 mm 06/07/2018 no polyps (iron deficiency anemia evaluation). Does have diverticulosis   History of ulcerated hyperplastic gastric polyp snared 2019 Iron deficiency anemia related to this polyp presumably   Umbilical hernia-considering surgical repair Dr. Kinsinger repaired  10/20/2022, open method     Chief Complaint: Rectal bleeding, follow-up of cirrhosis  [name], she had some bloating and distention that I thought might have been some volume overload particularly after her umbilical hernia surgery and she was treated with higher doses of diuretics after   a visit in April, furosemide increased from 20 to 40 mg daily and spironolactone 50 to 100 mg daily.  She had called back recently and her weight was down and we went back to 20 and 50 mg daily  respectively regarding these diuretics.  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  She is healing well after her umbilical hernia repair.  She saw a new cardiologist who wants to start her on Jardiance because of diastolic heart failure.  She did notice some runny stools lately soft or loose no watery diarrhea.  She has not had any rectal bleeding with wiping in a few days but thinks that she may have been having some hemorrhoidal bleeding.  She is going to get parenteral iron again as her ferritin is low again.  She is struggling to "initiate swallowing and "but I do not think there is any impact dysphagia type issues.  She is to have a modified barium swallow ordered through oncology.  There is a PET scan scheduled for July 8.  Recent ultrasound of the abdomen demonstrated stable cirrhosis on 12/15/2022, trace ascites chronic splenomegaly question slightly larger and chronic gallbladder wall thickening.   Wt Readings from Last 3 Encounters:  02/28/23 221 lb (100.2 kg)  02/24/23 221 lb 1.9 oz (100.3 kg)  02/23/23 221 lb (100.2 kg)    Lab Results  Component Value Date   ALT 17 02/22/2023   AST 21 02/22/2023   ALKPHOS 64 02/22/2023   BILITOT 0.4 02/22/2023   Lab Results  Component Value Date   CREATININE 0.84 02/22/2023   BUN 19 02/22/2023   NA 138 02/22/2023   K 4.5 02/22/2023   CL 105 02/22/2023   CO2 26 02/22/2023   Lab Results  Component Value Date   WBC 54.2 (HH) 02/22/2023   HGB 9.1 (L) 02/22/2023   HCT 29.1 (L) 02/22/2023   MCV 93.0 02/22/2023   PLT 93 (L) 02/22/2023    Allergies  Allergen Reactions   Benzonatate Anaphylaxis, Hives and Other (See Comments)   Fluticasone Furoate-Vilanterol Anaphylaxis and Hives    Other reaction(s): hives  breo ellipta   Iodine Hives, Swelling and Other (See Comments)    And Betadine/caused hives and swelling   Latex Hives, Itching, Rash and Other (See  Comments)    Burning   Simvastatin Other (See Comments)    Caused neuropathy in arms Neuropathy in arms   Bupropion Other (See Comments)    Negative thoughts. Crazy thoughts   Levofloxacin Other (See Comments)    Joint aches//leg, shoulder pain Joint pain   Penicillins Hives   Povidone-Iodine Hives   Amoxicillin Hives    Blisters Did it involve swelling of the face/tongue/throat, SOB, or low BP? Yes Did it involve sudden or severe rash/hives, skin peeling, or any reaction on the inside of your mouth or nose? Yes Did you need to seek medical attention at a hospital or doctor's office?Unknown When did it last happen? 2019      If all above answers are "NO", may proceed with cephalosporin use.    Chlorhexidine Itching   Chlorthalidone Other (See Comments)    Ears ringing    Diflucan [Fluconazole] Hives and Other (See Comments)    blister   Povidone Iodine    Amoxicillin-Pot Clavulanate Rash   Doxycycline Itching and Diarrhea   Current Meds  Medication Sig   albuterol (VENTOLIN HFA) 108 (  90 Base) MCG/ACT inhaler Inhale 1-2 puffs into the lungs every 6 (six) hours as needed (Cough). (Patient taking differently: Inhale 1-2 puffs into the lungs as needed (Cough).)   amLODipine (NORVASC) 10 MG tablet Take 1 tablet (10 mg total) by mouth daily.   aspirin EC 81 MG tablet Take 1 tablet (81 mg total) by mouth daily. Swallow whole.   azelastine (ASTELIN) 0.1 % nasal spray Place 1 spray into both nostrils 2 (two) times daily. Use in each nostril as directed   b complex vitamins capsule Take 1 capsule by mouth daily.   bacitracin 500 UNIT/GM ointment Apply topically 2 (two) times daily for 10 days   budesonide-formoterol (SYMBICORT) 80-4.5 MCG/ACT inhaler Inhale 2 puffs into the lungs daily. (Patient taking differently: Inhale 2 puffs into the lungs 2 (two) times daily. Patient usually does 2 puffs in morning and 1 puff in evening.)   CALCIUM PO Take 1,000 mg by mouth every morning. 500 mg  each   Carboxymethylcellul-Glycerin (REFRESH OPTIVE PF OP) Place 1 drop into both eyes daily as needed (dry eyes). Non-pres   cephALEXin (KEFLEX) 500 MG capsule Take 2 capsules (1,000 mg total) by mouth as directed 1 hour prior to dental work.   cetirizine (ZYRTEC) 10 MG tablet Take 10 mg by mouth daily.   Coenzyme Q10 (CO Q-10) 100 MG CAPS Take 100 mg by mouth daily.   Continuous Blood Gluc Sensor (FREESTYLE LIBRE 2 SENSOR) MISC Use to monitor blood glucose continuously - change every 14 days.   desoximetasone (TOPICORT) 0.25 % cream Apply a small amount to skin twice a day as needed for flares   diclofenac sodium (VOLTAREN) 1 % GEL Apply 2 g topically 2 (two) times daily as needed (knee pain/ foot /Triger finger).   empagliflozin (JARDIANCE) 10 MG TABS tablet Take 10 mg by mouth daily.   fluticasone (FLONASE) 50 MCG/ACT nasal spray Place 2 sprays into both nostrils in the morning. (Patient taking differently: Place 2 sprays into both nostrils 2 (two) times daily.)   furosemide (LASIX) 20 MG tablet Take 1 tablet (20 mg total) by mouth daily.   gabapentin (NEURONTIN) 300 MG capsule Take 1 capsule by mouth twice a day (Patient taking differently: Take 600 mg by mouth at bedtime.)   glucose blood test strip Use to test blood sugar 3 times daily as directed   hydrALAZINE (APRESOLINE) 25 MG tablet Take 1 tablet (25 mg total) by mouth 2 (two) times daily.   insulin aspart (NOVOLOG) 100 UNIT/ML injection Inject 30 Units into the skin 3 (three) times daily before meals.   insulin degludec (TRESIBA FLEXTOUCH) 200 UNIT/ML FlexTouch Pen Inject 80 Units into the skin daily.   Insulin Pen Needle (BD PEN NEEDLE NANO 2ND GEN) 32G X 4 MM MISC Use as directed 6 times daily   irbesartan (AVAPRO) 300 MG tablet Take 1 tablet (300 mg total) by mouth in the morning.   Lancets (ONETOUCH DELICA PLUS LANCET33G) MISC Use to test blood sugar 3 times daily   metFORMIN (GLUCOPHAGE-XR) 500 MG 24 hr tablet Take 2 tablets  (1,000 mg total) by mouth 2 (two) times daily.   omeprazole (PRILOSEC) 40 MG capsule Take 1 capsule by mouth twice daily   ondansetron (ZOFRAN-ODT) 4 MG disintegrating tablet Take 1 tablet (4 mg total) by mouth every 8 (eight) hours as needed for nausea or vomiting.   rosuvastatin (CRESTOR) 10 MG tablet Take 1 tablet (10 mg total) by mouth daily. (Patient taking differently: Take 10 mg   by mouth at bedtime.)   sertraline (ZOLOFT) 50 MG tablet Take 1 tablet (50 mg total) by mouth daily.   spironolactone (ALDACTONE) 50 MG tablet Take 1 tablet (50 mg total) by mouth daily.   terconazole (TERAZOL 3) 0.8 % vaginal cream Place 1 applicator vaginally daily as needed (yeast infections).   Past Medical History:  Diagnosis Date   Allergy    Asthma    seasonal   Cellulitis 2013   Left toe   Chronic kidney disease    stage 1   Cirrhosis (HCC)    Common migraine    History of   Complication of anesthesia    Per pt/had breathing problems with "block" during rotator cuff surgery. 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   Coronary artery disease    DDD (degenerative disc disease)    Depression    denies takes paxil for migraines   Diabetes mellitus    type 2   Diabetic peripheral neuropathy (HCC)    Diverticulitis    Diverticulosis    DJD (degenerative joint disease)    Esophageal varices (HCC)    GERD (gastroesophageal reflux disease)    History of colon polyps    hyperplastic   History of gastric polyp    Hyperlipidemia    Hypertension    Iron deficiency anemia    Obesity    OSA on CPAP    cpap   Peripheral neuropathy    Pneumonia    april 2020  mild   Primary localized osteoarthritis of left knee 03/27/2019   Sensorineural hearing loss    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HCC)    Past Surgical History:  Procedure Laterality Date   ANKLE SURGERY     Left    BREAST BIOPSY Left 2021   left axilla, lymphoma   COLONOSCOPY  05/2018   LEEP N/A 09/14/2018   Procedure: LOOP  ELECTROSURGICAL EXCISION PROCEDURE (LEEP);  Surgeon: McComb, John, MD;  Location: [facility];  Service: Gynecology;  Laterality: N/A;   MASS EXCISION Right 06/24/2021   Procedure: EXCISION RIGHT POSTERIOR NECK MASS;  Surgeon: Kinsinger, Luke Aaron, MD;  Location: [facility];  Service: General;  Laterality: Right;   ROTATOR CUFF REPAIR Bilateral 2012, 2015   TONSILLECTOMY     TOTAL KNEE ARTHROPLASTY Left 04/08/2019   Procedure: TOTAL KNEE ARTHROPLASTY;  Surgeon: Wainer, Robert, MD;  Location: [facility];  Service: Orthopedics;  Laterality: Left;   UMBILICAL HERNIA REPAIR N/A 10/20/2022   Procedure: OPEN HERNIA REPAIR UMBILICAL ADULT with Mesh;  Surgeon: Kinsinger, Luke Aaron, MD;  Location: [facility];  Service: General;  Laterality: N/A;   UPPER GI ENDOSCOPY  05/2018   Social History   Social History Narrative   1 caffeine drink daily    Right-handed   widow (husband died from [place] cirrhosis)   family history includes Bone cancer in her mother; Breast cancer in her mother; Diabetes Mellitus II in her brother, father, and sister; Heart failure in her father; Hypertension in her brother and father; Obesity in her sister; Other in her sister.   Review of Systems See HPI  Objective:   Physical Exam @BP (!) 110/40 (BP Location: Right Arm, Patient Position: Sitting, Cuff Size: Normal)   Pulse 76   Ht 5' 4" (1.626 m)   Wt 221 lb (100.2 kg)   BMI 37.93 kg/m @  General:  NAD Eyes:   anicteric Lungs:  clear Heart::  S1S2 2/6 RUSB m Abdomen:  soft and nontender, BS+ - obese -   healing umbilical hernia repeair scar Ext:   no edema, cyanosis or clubbing On the left posterior neck sort of infra-auricular there is a matted lymph node mass that slightly tender   Data Reviewed:  See HPI I have reviewed oncology notes labs and cardiology notes from recent weeks. 

## 2023-02-28 NOTE — Progress Notes (Signed)
Janice [name] 72 y.o. Feb 07, 1951 [id number]  Assessment & Plan:   Encounter Diagnoses  Name Primary?   Liver cirrhosis secondary to NASH (nonalcoholic steatohepatitis) (HCC) Yes   Bleeding hemorrhoids    Iron deficiency anemia, unspecified iron deficiency anemia type    Dysphagia, unspecified type    Heart murmur, systolic    Orders Placed This Encounter  Procedures   Protime-INR   AFP tumor marker   Ambulatory referral to Gastroenterology   Will plan for EGD to reassess for possible varices, gastric polyps that I thought were contributing to anemia in the past.  Look for portal gastropathy also.  It has been 2 years since she has had an EGD.  Having PET scan, we will follow-up on that as well.  I think it is okay for her to take Jardiance from a gastrointestinal/liver perspective.  She is on quite a few medications already.  I heard heart murmur in the right upper sternal border today.  Her last echocardiogram was in 2022.  She has bruits on the right and left neck though I did not listen there today cardiology review of visit on 02/23/2023 shows that.  The cardiologist did not hear a murmur.  She does have some valvular disease is on echocardiogram last year though I do not think those would fit with a right upper sternal border murmur.  I will message cardiology and let the patient know that I am doing so.  Sounds like a repeat echocardiogram would be reasonable.  Echocardiogram 11/08/2021: Normal LV systolic function with visual EF 60-65%. Left ventricle cavity is normal in size. No obvious regional wall motion abnormalities. Normal left ventricular wall thickness. Normal global wall motion. Doppler evidence of grade II (pseudonormal) diastolic dysfunction, elevated LAP. Mild (Grade I) aortic regurgitation. Mild (Grade I) mitral regurgitation. Mild tricuspid regurgitation. Mild pulmonary hypertension. RVSP measures 41 mmHg. Compared to study 02/03/2021 no significant  change.   Lab Results  Component Value Date   INR 1.1 (H) 02/28/2023   INR 1.1 (H) 08/26/2022   INR 1.1 (H) 05/06/2021    CC: Janice [name], Ravisankar, MD Dr. Tessa Lerner  -----------------------------------------------------------------------------  Gastroenterology summary:  MASH (NASH) cirrhosis diagnosed 2021 trace ascites at the time no encephalopathy no varices last EGD 2022-hyperplastic polypoid changes in the antrum.  On low-dose spironolactone.  She is not immune to hepatitis A or B and has not been vaccinated   Husband died from liver failure due to NASH  Swelling in fluid retention postop after feb 24 hernia repair treated with increase diuretics now back to prior dose [place] stable liver cirrhsosi and tr ascites nolesion   Question of liver lesions-last MRI 02/07/2022 not seen, does have splenomegaly with hypoenhancing splenic lesions stable history of lymphoma with multiple lymph nodes noted on MRI.  PET scan October 2023 without liver lesions   History of adenomatous colonic polyps 2012-2 cm pedunculated tubulovillous adenoma removed, complicated by post polypectomy bleed clipped 01/29/2021 08/01/2016 3 adenomas max 6 mm 06/07/2018 no polyps (iron deficiency anemia evaluation). Does have diverticulosis   History of ulcerated hyperplastic gastric polyp snared 2019 Iron deficiency anemia related to this polyp presumably   Umbilical hernia-considering surgical repair Dr. Sheliah Hatch repaired  10/20/2022, open method     Chief Complaint: Rectal bleeding, follow-up of cirrhosis  [name], she had some bloating and distention that I thought might have been some volume overload particularly after her umbilical hernia surgery and she was treated with higher doses of diuretics after  a visit in April, furosemide increased from 20 to 40 mg daily and spironolactone 50 to 100 mg daily.  She had called back recently and her weight was down and we went back to 20 and 50 mg daily  respectively regarding these diuretics.  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  She is healing well after her umbilical hernia repair.  She saw a new cardiologist who wants to start her on Jardiance because of diastolic heart failure.  She did notice some runny stools lately soft or loose no watery diarrhea.  She has not had any rectal bleeding with wiping in a few days but thinks that she may have been having some hemorrhoidal bleeding.  She is going to get parenteral iron again as her ferritin is low again.  She is struggling to "initiate swallowing and "but I do not think there is any impact dysphagia type issues.  She is to have a modified barium swallow ordered through oncology.  There is a PET scan scheduled for July 8.  Recent ultrasound of the abdomen demonstrated stable cirrhosis on 12/15/2022, trace ascites chronic splenomegaly question slightly larger and chronic gallbladder wall thickening.   Wt Readings from Last 3 Encounters:  02/28/23 221 lb (100.2 kg)  02/24/23 221 lb 1.9 oz (100.3 kg)  02/23/23 221 lb (100.2 kg)    Lab Results  Component Value Date   ALT 17 02/22/2023   AST 21 02/22/2023   ALKPHOS 64 02/22/2023   BILITOT 0.4 02/22/2023   Lab Results  Component Value Date   CREATININE 0.84 02/22/2023   BUN 19 02/22/2023   NA 138 02/22/2023   K 4.5 02/22/2023   CL 105 02/22/2023   CO2 26 02/22/2023   Lab Results  Component Value Date   WBC 54.2 (HH) 02/22/2023   HGB 9.1 (L) 02/22/2023   HCT 29.1 (L) 02/22/2023   MCV 93.0 02/22/2023   PLT 93 (L) 02/22/2023    Allergies  Allergen Reactions   Benzonatate Anaphylaxis, Hives and Other (See Comments)   Fluticasone Furoate-Vilanterol Anaphylaxis and Hives    Other reaction(s): hives  breo ellipta   Iodine Hives, Swelling and Other (See Comments)    And Betadine/caused hives and swelling   Latex Hives, Itching, Rash and Other (See  Comments)    Burning   Simvastatin Other (See Comments)    Caused neuropathy in arms Neuropathy in arms   Bupropion Other (See Comments)    Negative thoughts. Crazy thoughts   Levofloxacin Other (See Comments)    Joint aches//leg, shoulder pain Joint pain   Penicillins Hives   Povidone-Iodine Hives   Amoxicillin Hives    Blisters Did it involve swelling of the face/tongue/throat, SOB, or low BP? Yes Did it involve sudden or severe rash/hives, skin peeling, or any reaction on the inside of your mouth or nose? Yes Did you need to seek medical attention at a hospital or doctor's office?Unknown When did it last happen? 2019      If all above answers are "NO", may proceed with cephalosporin use.    Chlorhexidine Itching   Chlorthalidone Other (See Comments)    Ears ringing    Diflucan [Fluconazole] Hives and Other (See Comments)    blister   Povidone Iodine    Amoxicillin-Pot Clavulanate Rash   Doxycycline Itching and Diarrhea   Current Meds  Medication Sig   albuterol (VENTOLIN HFA) 108 (  90 Base) MCG/ACT inhaler Inhale 1-2 puffs into the lungs every 6 (six) hours as needed (Cough). (Patient taking differently: Inhale 1-2 puffs into the lungs as needed (Cough).)   amLODipine (NORVASC) 10 MG tablet Take 1 tablet (10 mg total) by mouth daily.   aspirin EC 81 MG tablet Take 1 tablet (81 mg total) by mouth daily. Swallow whole.   azelastine (ASTELIN) 0.1 % nasal spray Place 1 spray into both nostrils 2 (two) times daily. Use in each nostril as directed   b complex vitamins capsule Take 1 capsule by mouth daily.   bacitracin 500 UNIT/GM ointment Apply topically 2 (two) times daily for 10 days   budesonide-formoterol (SYMBICORT) 80-4.5 MCG/ACT inhaler Inhale 2 puffs into the lungs daily. (Patient taking differently: Inhale 2 puffs into the lungs 2 (two) times daily. Patient usually does 2 puffs in morning and 1 puff in evening.)   CALCIUM PO Take 1,000 mg by mouth every morning. 500 mg  each   Carboxymethylcellul-Glycerin (REFRESH OPTIVE PF OP) Place 1 drop into both eyes daily as needed (dry eyes). Non-pres   cephALEXin (KEFLEX) 500 MG capsule Take 2 capsules (1,000 mg total) by mouth as directed 1 hour prior to dental work.   cetirizine (ZYRTEC) 10 MG tablet Take 10 mg by mouth daily.   Coenzyme Q10 (CO Q-10) 100 MG CAPS Take 100 mg by mouth daily.   Continuous Blood Gluc Sensor (FREESTYLE LIBRE 2 SENSOR) MISC Use to monitor blood glucose continuously - change every 14 days.   desoximetasone (TOPICORT) 0.25 % cream Apply a small amount to skin twice a day as needed for flares   diclofenac sodium (VOLTAREN) 1 % GEL Apply 2 g topically 2 (two) times daily as needed (knee pain/ foot /Triger finger).   empagliflozin (JARDIANCE) 10 MG TABS tablet Take 10 mg by mouth daily.   fluticasone (FLONASE) 50 MCG/ACT nasal spray Place 2 sprays into both nostrils in the morning. (Patient taking differently: Place 2 sprays into both nostrils 2 (two) times daily.)   furosemide (LASIX) 20 MG tablet Take 1 tablet (20 mg total) by mouth daily.   gabapentin (NEURONTIN) 300 MG capsule Take 1 capsule by mouth twice a day (Patient taking differently: Take 600 mg by mouth at bedtime.)   glucose blood test strip Use to test blood sugar 3 times daily as directed   hydrALAZINE (APRESOLINE) 25 MG tablet Take 1 tablet (25 mg total) by mouth 2 (two) times daily.   insulin aspart (NOVOLOG) 100 UNIT/ML injection Inject 30 Units into the skin 3 (three) times daily before meals.   insulin degludec (TRESIBA FLEXTOUCH) 200 UNIT/ML FlexTouch Pen Inject 80 Units into the skin daily.   Insulin Pen Needle (BD PEN NEEDLE NANO 2ND GEN) 32G X 4 MM MISC Use as directed 6 times daily   irbesartan (AVAPRO) 300 MG tablet Take 1 tablet (300 mg total) by mouth in the morning.   Lancets (ONETOUCH DELICA PLUS LANCET33G) MISC Use to test blood sugar 3 times daily   metFORMIN (GLUCOPHAGE-XR) 500 MG 24 hr tablet Take 2 tablets  (1,000 mg total) by mouth 2 (two) times daily.   omeprazole (PRILOSEC) 40 MG capsule Take 1 capsule by mouth twice daily   ondansetron (ZOFRAN-ODT) 4 MG disintegrating tablet Take 1 tablet (4 mg total) by mouth every 8 (eight) hours as needed for nausea or vomiting.   rosuvastatin (CRESTOR) 10 MG tablet Take 1 tablet (10 mg total) by mouth daily. (Patient taking differently: Take 10 mg  by mouth at bedtime.)   sertraline (ZOLOFT) 50 MG tablet Take 1 tablet (50 mg total) by mouth daily.   spironolactone (ALDACTONE) 50 MG tablet Take 1 tablet (50 mg total) by mouth daily.   terconazole (TERAZOL 3) 0.8 % vaginal cream Place 1 applicator vaginally daily as needed (yeast infections).   Past Medical History:  Diagnosis Date   Allergy    Asthma    seasonal   Cellulitis 2013   Left toe   Chronic kidney disease    stage 1   Cirrhosis (HCC)    Common migraine    History of   Complication of anesthesia    Per pt/had breathing problems with "block" during rotator cuff surgery. 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   Coronary artery disease    DDD (degenerative disc disease)    Depression    denies takes paxil for migraines   Diabetes mellitus    type 2   Diabetic peripheral neuropathy (HCC)    Diverticulitis    Diverticulosis    DJD (degenerative joint disease)    Esophageal varices (HCC)    GERD (gastroesophageal reflux disease)    History of colon polyps    hyperplastic   History of gastric polyp    Hyperlipidemia    Hypertension    Iron deficiency anemia    Obesity    OSA on CPAP    cpap   Peripheral neuropathy    Pneumonia    april 2020  mild   Primary localized osteoarthritis of left knee 03/27/2019   Sensorineural hearing loss    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HCC)    Past Surgical History:  Procedure Laterality Date   ANKLE SURGERY     Left    BREAST BIOPSY Left 2021   left axilla, lymphoma   COLONOSCOPY  05/2018   LEEP N/A 09/14/2018   Procedure: LOOP  ELECTROSURGICAL EXCISION PROCEDURE (LEEP);  Surgeon: Richardean Chimera, MD;  Location: [facility];  Service: Gynecology;  Laterality: N/A;   MASS EXCISION Right 06/24/2021   Procedure: EXCISION RIGHT POSTERIOR NECK MASS;  Surgeon: Kinsinger, De Blanch, MD;  Location: [facility];  Service: General;  Laterality: Right;   ROTATOR CUFF REPAIR Bilateral 2012, 2015   TONSILLECTOMY     TOTAL KNEE ARTHROPLASTY Left 04/08/2019   Procedure: TOTAL KNEE ARTHROPLASTY;  Surgeon: Salvatore Marvel, MD;  Location: [facility];  Service: Orthopedics;  Laterality: Left;   UMBILICAL HERNIA REPAIR N/A 10/20/2022   Procedure: OPEN HERNIA REPAIR UMBILICAL ADULT with Mesh;  Surgeon: Kinsinger, De Blanch, MD;  Location: [facility];  Service: General;  Laterality: N/A;   UPPER GI ENDOSCOPY  05/2018   Social History   Social History Narrative   1 caffeine drink daily    Right-handed   widow (husband died from [place] cirrhosis)   family history includes Bone cancer in her mother; Breast cancer in her mother; Diabetes Mellitus II in her brother, father, and sister; Heart failure in her father; Hypertension in her brother and father; Obesity in her sister; Other in her sister.   Review of Systems See HPI  Objective:   Physical Exam @BP  (!) 110/40 (BP Location: Right Arm, Patient Position: Sitting, Cuff Size: Normal)   Pulse 76   Ht 5\' 4"  (1.626 m)   Wt 221 lb (100.2 kg)   BMI 37.93 kg/m @  General:  NAD Eyes:   anicteric Lungs:  clear Heart::  S1S2 2/6 RUSB m Abdomen:  soft and nontender, BS+ - obese -  healing umbilical hernia repeair scar Ext:   no edema, cyanosis or clubbing On the left posterior neck sort of infra-auricular there is a matted lymph node mass that slightly tender   Data Reviewed:  See HPI I have reviewed oncology notes labs and cardiology notes from recent weeks.

## 2023-03-01 ENCOUNTER — Other Ambulatory Visit: Payer: Self-pay | Admitting: Cardiology

## 2023-03-01 ENCOUNTER — Other Ambulatory Visit: Payer: Self-pay

## 2023-03-01 DIAGNOSIS — I5189 Other ill-defined heart diseases: Secondary | ICD-10-CM

## 2023-03-01 DIAGNOSIS — I351 Nonrheumatic aortic (valve) insufficiency: Secondary | ICD-10-CM

## 2023-03-01 LAB — AFP TUMOR MARKER: AFP-Tumor Marker: 3.2 ng/mL

## 2023-03-01 NOTE — Progress Notes (Signed)
Patient was recently seen by gastroenterology.  She was concern for cardiac murmur on physical examination.  Last echocardiogram results reviewed.  She does have mild valvular heart disease as of July 2023.  Due to concerns for murmur at the second right intercostal space we will proceed with echocardiography for further evaluation.  Tessa Lerner, [place], [facility]  Pager:  [phone] Office: [phone]

## 2023-03-02 ENCOUNTER — Telehealth: Payer: Self-pay | Admitting: Hematology

## 2023-03-02 ENCOUNTER — Encounter: Payer: Self-pay | Admitting: Certified Registered Nurse Anesthetist

## 2023-03-02 ENCOUNTER — Telehealth: Payer: Self-pay | Admitting: *Deleted

## 2023-03-02 ENCOUNTER — Encounter: Payer: Self-pay | Admitting: Hematology

## 2023-03-02 DIAGNOSIS — T884XXA Failed or difficult intubation, initial encounter: Secondary | ICD-10-CM

## 2023-03-02 NOTE — Telephone Encounter (Signed)
Dr. Leone Payor,  On 10/20/22 this pt was determined to be a difficult intubation, her procedure will need to be done at the hospital.   Best regards,  Cathlyn Parsons

## 2023-03-02 NOTE — Telephone Encounter (Signed)
Contacted patient to scheduled appointments. Patient is aware of appointments that are scheduled.   

## 2023-03-03 ENCOUNTER — Encounter: Payer: Self-pay | Admitting: *Deleted

## 2023-03-03 DIAGNOSIS — T884XXA Failed or difficult intubation, initial encounter: Secondary | ICD-10-CM

## 2023-03-03 HISTORY — DX: Failed or difficult intubation, initial encounter: T88.4XXA

## 2023-03-03 NOTE — Telephone Encounter (Signed)
Please let patient know this and that we need to do procedure at [facility].  I will put on my list - it is possible we might be able to do this 6/27 but not certain - that schedule is being adjusted currently  It is not urgent so we do have time

## 2023-03-06 ENCOUNTER — Other Ambulatory Visit (HOSPITAL_COMMUNITY): Payer: Self-pay

## 2023-03-06 ENCOUNTER — Other Ambulatory Visit: Payer: Self-pay

## 2023-03-06 DIAGNOSIS — T884XXA Failed or difficult intubation, initial encounter: Secondary | ICD-10-CM

## 2023-03-06 DIAGNOSIS — D509 Iron deficiency anemia, unspecified: Secondary | ICD-10-CM

## 2023-03-06 DIAGNOSIS — R1310 Dysphagia, unspecified: Secondary | ICD-10-CM

## 2023-03-06 DIAGNOSIS — K7460 Unspecified cirrhosis of liver: Secondary | ICD-10-CM

## 2023-03-06 MED ORDER — NOVOLOG FLEXPEN 100 UNIT/ML ~~LOC~~ SOPN
PEN_INJECTOR | SUBCUTANEOUS | 3 refills | Status: DC
  Filled 2023-03-06: qty 90, 100d supply, fill #0
  Filled 2023-05-18 – 2023-06-10 (×2): qty 90, 100d supply, fill #1
  Filled 2023-10-09: qty 90, 100d supply, fill #2
  Filled 2024-01-25: qty 90, 100d supply, fill #3

## 2023-03-06 MED ORDER — NOVOLOG FLEXPEN 100 UNIT/ML ~~LOC~~ SOPN
PEN_INJECTOR | SUBCUTANEOUS | 11 refills | Status: DC
  Filled 2023-03-06: qty 30, 50d supply, fill #0

## 2023-03-06 MED ORDER — NOVOLOG FLEXPEN 100 UNIT/ML ~~LOC~~ SOPN
PEN_INJECTOR | SUBCUTANEOUS | 3 refills | Status: DC
  Filled 2023-03-06: qty 90, 100d supply, fill #0

## 2023-03-06 NOTE — Telephone Encounter (Signed)
Left message for pt to call back  °

## 2023-03-06 NOTE — Telephone Encounter (Signed)
Pt was notified of Dr. Leone Payor recommendations: Pt was scheduled for her EGD 03/09/2023 at [facility] at 8:15 AM. Pt to arrive at 6:45 AM :  Pt made aware. Case ID [id number]: Ambulatory referral to GI placed in EPIC Prep instructions were sent to pt via my chart. Pt made aware. Pt verbalized understanding with all questions answered.

## 2023-03-06 NOTE — Telephone Encounter (Signed)
Patient is returning your call.  

## 2023-03-07 ENCOUNTER — Telehealth: Payer: Self-pay | Admitting: Internal Medicine

## 2023-03-07 ENCOUNTER — Other Ambulatory Visit: Payer: Self-pay

## 2023-03-07 ENCOUNTER — Encounter: Payer: Self-pay | Admitting: Hematology

## 2023-03-07 NOTE — Telephone Encounter (Signed)
Pt was left a detailed message notifying her that the Hospital will contact her in regard to her diabetic medications.

## 2023-03-07 NOTE — Telephone Encounter (Signed)
Inbound call from patient requesting to know if she can take medication Metformin the night for 6/27 procedure. States it is okay to leave a detailed voicemail. Please advise, thank you.

## 2023-03-08 ENCOUNTER — Encounter: Payer: PPO | Admitting: Internal Medicine

## 2023-03-08 NOTE — Anesthesia Preprocedure Evaluation (Signed)
Anesthesia Evaluation  Patient identified by MRN, date of birth, ID band Patient awake    Reviewed: Allergy & Precautions, NPO status , Patient's Chart, lab work & pertinent test results  History of Anesthesia Complications (+) DIFFICULT AIRWAY and history of anesthetic complications  Airway Mallampati: II  TM Distance: >3 FB Neck ROM: Full    Dental no notable dental hx. (+) Teeth Intact, Implants, Dental Advisory Given   Pulmonary asthma , sleep apnea and Continuous Positive Airway Pressure Ventilation    Pulmonary exam normal breath sounds clear to auscultation       Cardiovascular hypertension, + Peripheral Vascular Disease (L CArotid Stenosis- assymptomatic)  Normal cardiovascular exam Rhythm:Regular Rate:Normal     Neuro/Psych  Headaches PSYCHIATRIC DISORDERS  Depression     Neuromuscular disease    GI/Hepatic ,GERD  ,,(+) Cirrhosis       NASH   Endo/Other  diabetes    Renal/GU      Musculoskeletal  (+) Arthritis ,    Abdominal  (+) + obese  Peds  Hematology   Anesthesia Other Findings All: See List  Reproductive/Obstetrics                             Anesthesia Physical Anesthesia Plan  ASA: 3  Anesthesia Plan: MAC   Post-op Pain Management:    Induction:   PONV Risk Score and Plan: Propofol infusion and Treatment may vary due to age or medical condition  Airway Management Planned: Nasal Cannula and Natural Airway  Additional Equipment: None  Intra-op Plan:   Post-operative Plan:   Informed Consent: I have reviewed the patients History and Physical, chart, labs and discussed the procedure including the risks, benefits and alternatives for the proposed anesthesia with the patient or authorized representative who has indicated his/her understanding and acceptance.     Dental advisory given  Plan Discussed with:   Anesthesia Plan Comments:          Anesthesia Quick Evaluation

## 2023-03-09 ENCOUNTER — Other Ambulatory Visit: Payer: Self-pay

## 2023-03-09 ENCOUNTER — Ambulatory Visit (HOSPITAL_BASED_OUTPATIENT_CLINIC_OR_DEPARTMENT_OTHER): Payer: PPO | Admitting: Anesthesiology

## 2023-03-09 ENCOUNTER — Encounter (HOSPITAL_COMMUNITY): Payer: Self-pay | Admitting: Internal Medicine

## 2023-03-09 ENCOUNTER — Encounter (HOSPITAL_COMMUNITY)
Admission: RE | Disposition: A | Discharge status: Home / Self Care | Payer: Self-pay | Source: Home / Self Care | Attending: Internal Medicine

## 2023-03-09 ENCOUNTER — Ambulatory Visit (HOSPITAL_COMMUNITY): Payer: PPO | Admitting: Anesthesiology

## 2023-03-09 ENCOUNTER — Other Ambulatory Visit: Payer: HMO

## 2023-03-09 ENCOUNTER — Ambulatory Visit (HOSPITAL_COMMUNITY)
Admission: RE | Admit: 2023-03-09 | Discharge: 2023-03-09 | Disposition: A | Discharge status: Home / Self Care | Payer: PPO | Attending: Internal Medicine | Admitting: Internal Medicine

## 2023-03-09 DIAGNOSIS — I10 Essential (primary) hypertension: Secondary | ICD-10-CM | POA: Diagnosis not present

## 2023-03-09 DIAGNOSIS — I8510 Secondary esophageal varices without bleeding: Secondary | ICD-10-CM | POA: Diagnosis present

## 2023-03-09 DIAGNOSIS — K3189 Other diseases of stomach and duodenum: Secondary | ICD-10-CM | POA: Diagnosis present

## 2023-03-09 DIAGNOSIS — I129 Hypertensive chronic kidney disease with stage 1 through stage 4 chronic kidney disease, or unspecified chronic kidney disease: Secondary | ICD-10-CM | POA: Diagnosis not present

## 2023-03-09 DIAGNOSIS — K766 Portal hypertension: Secondary | ICD-10-CM

## 2023-03-09 DIAGNOSIS — D509 Iron deficiency anemia, unspecified: Secondary | ICD-10-CM

## 2023-03-09 DIAGNOSIS — C9110 Chronic lymphocytic leukemia of B-cell type not having achieved remission: Secondary | ICD-10-CM | POA: Diagnosis not present

## 2023-03-09 DIAGNOSIS — K7460 Unspecified cirrhosis of liver: Secondary | ICD-10-CM

## 2023-03-09 DIAGNOSIS — N181 Chronic kidney disease, stage 1: Secondary | ICD-10-CM | POA: Diagnosis not present

## 2023-03-09 DIAGNOSIS — E1122 Type 2 diabetes mellitus with diabetic chronic kidney disease: Secondary | ICD-10-CM | POA: Diagnosis not present

## 2023-03-09 HISTORY — PX: ESOPHAGOGASTRODUODENOSCOPY (EGD) WITH PROPOFOL: SHX5813

## 2023-03-09 LAB — GLUCOSE, CAPILLARY: Glucose-Capillary: 189 mg/dL — ABNORMAL HIGH (ref 70–99)

## 2023-03-09 SURGERY — ESOPHAGOGASTRODUODENOSCOPY (EGD) WITH PROPOFOL
Anesthesia: Monitor Anesthesia Care

## 2023-03-09 MED ORDER — PROPOFOL 10 MG/ML IV BOLUS
INTRAVENOUS | Status: DC | PRN
  Administered 2023-03-09: 30 mg via INTRAVENOUS

## 2023-03-09 MED ORDER — SODIUM CHLORIDE 0.9 % IV SOLN: INTRAVENOUS | Status: DC

## 2023-03-09 MED ORDER — PROPOFOL 500 MG/50ML IV EMUL
INTRAVENOUS | Status: AC
  Filled 2023-03-09: qty 50

## 2023-03-09 MED ORDER — PROPOFOL 500 MG/50ML IV EMUL
INTRAVENOUS | Status: DC | PRN
  Administered 2023-03-09: 125 ug/kg/min via INTRAVENOUS

## 2023-03-09 MED ORDER — LACTATED RINGERS IV SOLN
INTRAVENOUS | Status: AC | PRN
  Administered 2023-03-09: 1000 mL via INTRAVENOUS

## 2023-03-09 MED ORDER — LACTATED RINGERS IV SOLN: INTRAVENOUS | Status: DC

## 2023-03-09 SURGICAL SUPPLY — 15 items

## 2023-03-09 NOTE — Op Note (Addendum)
[facility] Patient Name: Janice [name] Procedure Date: 03/09/2023 MRN: [id number] Attending MD: Iva Boop , MD, [id number] Date of Birth: 1951/03/20 CSN: [id number] Age: 72 Admit Type: Outpatient Procedure:                Upper GI endoscopy Indications:              Cirrhosis rule out esophageal varices Providers:                Iva Boop, MD, Lorenza Evangelist, RN, Martha Clan, RN, Marja Kays, Technician Referring MD:             Iva Boop, MD Medicines:                Monitored Anesthesia Care Complications:            No immediate complications. Estimated Blood Loss:     Estimated blood loss: none. Procedure:                Pre-Anesthesia Assessment:                           - Prior to the procedure, a History and Physical                            was performed, and patient medications and                            allergies were reviewed. The patient's tolerance of                            previous anesthesia was also reviewed. The risks                            and benefits of the procedure and the sedation                            options and risks were discussed with the patient.                            All questions were answered, and informed consent                            was obtained. Prior Anticoagulants: The patient has                            taken no anticoagulant or antiplatelet agents. ASA                            Grade Assessment: III - A patient with severe                            systemic disease. After reviewing the risks and  benefits, the patient was deemed in satisfactory                            condition to undergo the procedure.                           After obtaining informed consent, the endoscope was                            passed under direct vision. Throughout the                            procedure, the patient's blood  pressure, pulse, and                            oxygen saturations were monitored continuously. The                            [id number]-[id number] ([id number]) Olympus endoscope was introduced                            through the mouth, and advanced to the second part                            of duodenum. The upper GI endoscopy was                            accomplished without difficulty. The patient                            tolerated the procedure well. Scope In: Scope Out: Findings:      Grade I varices were found in the distal esophagus. They were 3 mm in       largest diameter.      Moderate portal hypertensive gastropathy was found in the entire       examined stomach.      Localized nodular mucosa was found in the prepyloric region of the       stomach.      The exam was otherwise without abnormality.      The cardia and gastric fundus were normal on retroflexion. Impression:               - Grade I esophageal varices. Very small "trace"                           - Portal hypertensive gastropathy. moderate,                            slightly friable.                           - Nodular mucosa in the prepyloric region of the                            stomach. As seen before and already biopsied -  non-diagnostic.                           - The examination was otherwise normal.                           - No specimens collected. Moderate Sedation:      Not Applicable - Patient had care per Anesthesia. Recommendation:           - Patient has a contact number available for                            emergencies. The signs and symptoms of potential                            delayed complications were discussed with the                            patient. Return to normal activities tomorrow.                            Written discharge instructions were provided to the                            patient.                           - Resume previous diet.                            - Continue present medications.                           - Repeat upper endoscopy in 1 year for                            surveillance. consider starting carvedilol - await                            upcoming Echo and then check w/ cardiology. If we                            start carvedilol then no need for recall EGD                           - she has a pending PET scan - will review that and                            decide next steps - anticipate office visit Dec                            24/Jan 25 Procedure Code(s):        --- Professional ---                           [id number], Esophagogastroduodenoscopy,  flexible,                            transoral; diagnostic, including collection of                            specimen(s) by brushing or washing, when performed                            (separate procedure) Diagnosis Code(s):        --- Professional ---                           K74.60, Unspecified cirrhosis of liver                           I85.10, Secondary esophageal varices without                            bleeding                           K76.6, Portal hypertension                           K31.89, Other diseases of stomach and duodenum CPT copyright 2022 American Medical Association. All rights reserved. The codes documented in this report are preliminary and upon coder review may  be revised to meet current compliance requirements. Iva Boop, MD 03/09/2023 8:41:02 AM This report has been signed electronically. Number of Addenda: 0

## 2023-03-09 NOTE — Anesthesia Postprocedure Evaluation (Signed)
Anesthesia Post Note  Patient: Janice [name]  Procedure(s) Performed: ESOPHAGOGASTRODUODENOSCOPY (EGD) WITH PROPOFOL     Patient location during evaluation: Endoscopy Anesthesia Type: MAC Level of consciousness: awake and alert Pain management: pain level controlled Vital Signs Assessment: post-procedure vital signs reviewed and stable Respiratory status: spontaneous breathing, nonlabored ventilation, respiratory function stable and patient connected to nasal cannula oxygen Cardiovascular status: blood pressure returned to baseline and stable Postop Assessment: no apparent nausea or vomiting Anesthetic complications: no   No notable events documented.  Last Vitals:  Vitals:   03/09/23 0837 03/09/23 0847  BP: (!) 109/28 (!) 122/45  Pulse: 67 65  Resp: 14 16  Temp:    SpO2: 97% 99%    Last Pain:  Vitals:   03/09/23 0847  TempSrc:   PainSc: 0-No pain                 Trevor Iha

## 2023-03-09 NOTE — Transfer of Care (Signed)
Immediate Anesthesia Transfer of Care Note  Patient: Janice [name]  Procedure(s) Performed: ESOPHAGOGASTRODUODENOSCOPY (EGD) WITH PROPOFOL  Patient Location: PACU and Endoscopy Unit  Anesthesia Type:MAC  Level of Consciousness: awake, drowsy, and responds to stimulation  Airway & Oxygen Therapy: Patient Spontanous Breathing and Patient connected to face mask oxygen  Post-op Assessment: Report given to RN and Post -op Vital signs reviewed and stable  Post vital signs: Reviewed and stable  Last Vitals:  Vitals Value Taken Time  BP 118/38 03/09/23 0825  Temp    Pulse 71 03/09/23 0826  Resp 17 03/09/23 0826  SpO2 100 % 03/09/23 0826  Vitals shown include unvalidated device data.  Last Pain:  Vitals:   03/09/23 0718  TempSrc: Tympanic  PainSc: 0-No pain         Complications: No notable events documented.

## 2023-03-09 NOTE — Interval H&P Note (Signed)
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  The various methods of treatment have been discussed with the patient and family. After consideration of risks, benefits and other options for treatment, the patient has consented to  Procedure(s): ESOPHAGOGASTRODUODENOSCOPY (EGD) WITH PROPOFOL (N/A) as a surgical intervention.  The patient's history has been reviewed, patient examined, no change in status, stable for surgery.  I have reviewed the patient's chart and labs.  Questions were answered to the patient's satisfaction.     Stan Head

## 2023-03-09 NOTE — Anesthesia Procedure Notes (Signed)
Procedure Name: MAC Date/Time: 03/09/2023 8:13 AM  Performed by: Elyn Peers, CRNAPre-anesthesia Checklist: Patient identified, Emergency Drugs available, Suction available, Patient being monitored and Timeout performed Oxygen Delivery Method: Simple face mask Preoxygenation: POM used. Placement Confirmation: positive ETCO2

## 2023-03-09 NOTE — Discharge Instructions (Addendum)
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 The changes are mild. Will need to recheck this in a year. Send me a message when you get your PET scan done or ask the radiologists to send me a copy.  We should see each other in December or January - sooner if needed.  Mark your calendar and call my office in October to make that appointment to see me.  I appreciate the opportunity to care for you. Iva Boop, MD, FACG     YOU HAD AN ENDOSCOPIC PROCEDURE TODAY: Refer to the procedure report and other information in the discharge instructions given to you for any specific questions about what was found during the examination. If this information does not answer your questions, please call [facility] office at [phone] to clarify.   YOU SHOULD EXPECT: Some feelings of bloating in the abdomen. Passage of more gas than usual. Walking can help get rid of the air that was put into your GI tract during the procedure and reduce the bloating. If you had a lower endoscopy (such as a colonoscopy or flexible sigmoidoscopy) you may notice spotting of blood in your stool or on the toilet paper. Some abdominal soreness may be present for a day or two, also.  DIET: Your first meal following the procedure should be a light meal and then it is ok to progress to your normal diet. A half-sandwich or bowl of soup is an example of a good first meal. Heavy or fried foods are harder to digest and may make you feel nauseous or bloated. Drink plenty of fluids but you should avoid alcoholic beverages for 24 hours. If you had a esophageal dilation, please see attached instructions for diet.    ACTIVITY: Your care partner should take you home directly after the procedure. You should plan to take it easy, moving slowly for the rest of the day. You can resume normal activity the day after the procedure however YOU SHOULD NOT DRIVE, use power tools, machinery or perform tasks that involve climbing or major  physical exertion for 24 hours (because of the sedation medicines used during the test).   SYMPTOMS TO REPORT IMMEDIATELY: A gastroenterologist can be reached at any hour. Please call [phone]  for any of the following symptoms:   Following upper endoscopy (EGD, EUS, ERCP, esophageal dilation) Vomiting of blood or coffee ground material  New, significant abdominal pain  New, significant chest pain or pain under the shoulder blades  Painful or persistently difficult swallowing  New shortness of breath  Black, tarry-looking or red, bloody stools  FOLLOW UP:  If any biopsies were taken you will be contacted by phone or by letter within the next 1-3 weeks. Call [phone]  if you have not heard about the biopsies in 3 weeks.     Please also call with any specific questions about appointments or follow up tests.

## 2023-03-12 ENCOUNTER — Encounter (HOSPITAL_COMMUNITY): Payer: Self-pay | Admitting: Internal Medicine

## 2023-03-13 ENCOUNTER — Encounter: Payer: Self-pay | Admitting: Cardiology

## 2023-03-13 ENCOUNTER — Encounter: Payer: Self-pay | Admitting: Hematology

## 2023-03-15 ENCOUNTER — Encounter: Payer: Self-pay | Admitting: Hematology

## 2023-03-20 ENCOUNTER — Other Ambulatory Visit (HOSPITAL_COMMUNITY): Payer: Self-pay

## 2023-03-20 ENCOUNTER — Encounter: Payer: Self-pay | Admitting: Hematology

## 2023-03-20 ENCOUNTER — Encounter (HOSPITAL_COMMUNITY)
Admission: RE | Admit: 2023-03-20 | Discharge: 2023-03-20 | Disposition: A | Discharge status: Home / Self Care | Payer: PPO | Source: Ambulatory Visit | Attending: Hematology | Admitting: Hematology

## 2023-03-20 ENCOUNTER — Other Ambulatory Visit: Payer: Self-pay

## 2023-03-20 DIAGNOSIS — C9110 Chronic lymphocytic leukemia of B-cell type not having achieved remission: Secondary | ICD-10-CM | POA: Diagnosis not present

## 2023-03-20 LAB — GLUCOSE, CAPILLARY: Glucose-Capillary: 148 mg/dL — ABNORMAL HIGH (ref 70–99)

## 2023-03-20 MED ORDER — FLUDEOXYGLUCOSE F - 18 (FDG) INJECTION
11.0000 | Freq: Once | INTRAVENOUS | Status: AC | PRN
  Administered 2023-03-20: 10.9 via INTRAVENOUS

## 2023-03-22 ENCOUNTER — Inpatient Hospital Stay: Payer: PPO | Attending: Hematology

## 2023-03-22 VITALS — BP 127/48 | HR 70 | Temp 98.0°F | Resp 18 | Wt 214.5 lb

## 2023-03-22 DIAGNOSIS — D801 Nonfamilial hypogammaglobulinemia: Secondary | ICD-10-CM | POA: Diagnosis not present

## 2023-03-22 DIAGNOSIS — D696 Thrombocytopenia, unspecified: Secondary | ICD-10-CM | POA: Insufficient documentation

## 2023-03-22 DIAGNOSIS — C9110 Chronic lymphocytic leukemia of B-cell type not having achieved remission: Secondary | ICD-10-CM | POA: Insufficient documentation

## 2023-03-22 DIAGNOSIS — R5383 Other fatigue: Secondary | ICD-10-CM | POA: Diagnosis not present

## 2023-03-22 DIAGNOSIS — C8300 Small cell B-cell lymphoma, unspecified site: Secondary | ICD-10-CM

## 2023-03-22 DIAGNOSIS — Z808 Family history of malignant neoplasm of other organs or systems: Secondary | ICD-10-CM | POA: Insufficient documentation

## 2023-03-22 DIAGNOSIS — Z803 Family history of malignant neoplasm of breast: Secondary | ICD-10-CM | POA: Diagnosis not present

## 2023-03-22 DIAGNOSIS — K7460 Unspecified cirrhosis of liver: Secondary | ICD-10-CM | POA: Diagnosis not present

## 2023-03-22 MED ORDER — METHYLPREDNISOLONE SODIUM SUCC 40 MG IJ SOLR
40.0000 mg | Freq: Every day | INTRAMUSCULAR | Status: DC
  Administered 2023-03-22: 40 mg via INTRAVENOUS
  Filled 2023-03-22: qty 1

## 2023-03-22 MED ORDER — DIPHENHYDRAMINE HCL 25 MG PO CAPS
25.0000 mg | ORAL_CAPSULE | Freq: Once | ORAL | Status: AC
  Administered 2023-03-22: 25 mg via ORAL
  Filled 2023-03-22: qty 1

## 2023-03-22 MED ORDER — IMMUNE GLOBULIN (HUMAN) 10 GM/100ML IV SOLN
400.0000 mg/kg | INTRAVENOUS | Status: DC
  Administered 2023-03-22: 40 g via INTRAVENOUS
  Filled 2023-03-22: qty 400

## 2023-03-22 MED ORDER — DEXTROSE 5 % IV SOLN: INTRAVENOUS | Status: DC

## 2023-03-22 MED ORDER — ACETAMINOPHEN 325 MG PO TABS
650.0000 mg | ORAL_TABLET | Freq: Once | ORAL | Status: AC
  Administered 2023-03-22: 650 mg via ORAL
  Filled 2023-03-22: qty 2

## 2023-03-23 ENCOUNTER — Ambulatory Visit (HOSPITAL_COMMUNITY)
Admission: RE | Admit: 2023-03-23 | Discharge: 2023-03-23 | Disposition: A | Discharge status: Home / Self Care | Payer: PPO | Source: Ambulatory Visit | Attending: Internal Medicine | Admitting: Internal Medicine

## 2023-03-23 ENCOUNTER — Encounter: Payer: Self-pay | Admitting: Hematology

## 2023-03-23 ENCOUNTER — Ambulatory Visit (HOSPITAL_COMMUNITY)
Admission: RE | Admit: 2023-03-23 | Discharge: 2023-03-23 | Disposition: A | Discharge status: Home / Self Care | Payer: PPO | Source: Ambulatory Visit

## 2023-03-23 DIAGNOSIS — R1310 Dysphagia, unspecified: Secondary | ICD-10-CM

## 2023-03-23 DIAGNOSIS — R1312 Dysphagia, oropharyngeal phase: Secondary | ICD-10-CM | POA: Diagnosis not present

## 2023-03-24 ENCOUNTER — Inpatient Hospital Stay (HOSPITAL_BASED_OUTPATIENT_CLINIC_OR_DEPARTMENT_OTHER): Payer: PPO | Admitting: Hematology

## 2023-03-24 ENCOUNTER — Other Ambulatory Visit: Payer: Self-pay

## 2023-03-24 ENCOUNTER — Inpatient Hospital Stay: Payer: PPO

## 2023-03-24 VITALS — BP 137/58 | HR 86 | Temp 98.1°F | Resp 18 | Wt 217.2 lb

## 2023-03-24 DIAGNOSIS — E1140 Type 2 diabetes mellitus with diabetic neuropathy, unspecified: Secondary | ICD-10-CM | POA: Diagnosis not present

## 2023-03-24 DIAGNOSIS — D801 Nonfamilial hypogammaglobulinemia: Secondary | ICD-10-CM | POA: Diagnosis not present

## 2023-03-24 DIAGNOSIS — G629 Polyneuropathy, unspecified: Secondary | ICD-10-CM | POA: Diagnosis not present

## 2023-03-24 DIAGNOSIS — C8300 Small cell B-cell lymphoma, unspecified site: Secondary | ICD-10-CM

## 2023-03-24 DIAGNOSIS — R1312 Dysphagia, oropharyngeal phase: Secondary | ICD-10-CM | POA: Diagnosis not present

## 2023-03-24 DIAGNOSIS — K760 Fatty (change of) liver, not elsewhere classified: Secondary | ICD-10-CM | POA: Diagnosis not present

## 2023-03-24 DIAGNOSIS — C9110 Chronic lymphocytic leukemia of B-cell type not having achieved remission: Secondary | ICD-10-CM | POA: Diagnosis not present

## 2023-03-24 DIAGNOSIS — K7460 Unspecified cirrhosis of liver: Secondary | ICD-10-CM | POA: Diagnosis not present

## 2023-03-24 DIAGNOSIS — I119 Hypertensive heart disease without heart failure: Secondary | ICD-10-CM | POA: Diagnosis not present

## 2023-03-24 DIAGNOSIS — G4733 Obstructive sleep apnea (adult) (pediatric): Secondary | ICD-10-CM | POA: Diagnosis not present

## 2023-03-24 DIAGNOSIS — F329 Major depressive disorder, single episode, unspecified: Secondary | ICD-10-CM | POA: Diagnosis not present

## 2023-03-24 DIAGNOSIS — D509 Iron deficiency anemia, unspecified: Secondary | ICD-10-CM | POA: Diagnosis not present

## 2023-03-24 DIAGNOSIS — M79605 Pain in left leg: Secondary | ICD-10-CM | POA: Diagnosis not present

## 2023-03-24 NOTE — Progress Notes (Signed)
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  INTERVAL HISTORY:  Janice [name] is a 72 y.o. 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 Patient was last seen by me on 02/22/2023 and complained of painful and swollen lymph nodes in her neck, worsened seasonal allergies, one episode of fever, left ear pain, swallowing issues limiting p.o. intake, choking, headache, frequent epistaxis, returned SOB, occasional left shoulder pain, frequent fatigue, mental cloudiness 5x a week.   Patient was admitted on 03/09/2023 for esophagogastroduodenoscopy with propofol as intervention for IDA, liver cirrhosis secondary to NASH.  Today, she is accompanied by her sister. She complains of fluctuating left-sided neck pain and swelling which has worsened in the last 1.5 weeks. The area is actively painful at this time. She report that her neck pain is affecting her swallowing habits. She notes some mild tenderness inside her throat.   Patient denies any recent infection issues, abdominal pain, black stools, changes in bowel habits. She does report occasional hemorridal bleeding. She reports improved appetite. Patient has improved swimming ability and regularly uses water weights.  Patient complains of intermittent shin pain radiating to feet beginning two months ago. She believes it is bone pain which may be attributed to IVIG. She reports that beginning 3 weeks ago, she endorsed chills on 5 occasions.   She reports significantly improved energy levels. Patient is set up to receive IV iron next  week. She was recently started on 10 MG Jardiance daily.  MEDICAL HISTORY:  Past Medical History:  Diagnosis Date   Allergy    Asthma    seasonal   Cellulitis 2013   Left toe   Chronic kidney disease    stage 1   Cirrhosis (HCC)    Common migraine    History of   Complication of anesthesia    Per pt/had breathing problems with "block" during rotator cuff surgery. 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   Coronary artery disease    DDD (degenerative disc disease)    Depression    denies takes paxil for migraines   Diabetes mellitus    type 2   Diabetic peripheral neuropathy (HCC)    Difficult airway for intubation 10/20/2022   DLx 1 with MAC 4, unable to see cords, DL with lopro S3 glidescope, easy atraumatic intubation   Difficult intubation 03/03/2023   Diverticulitis    Diverticulosis    DJD (degenerative joint disease)    Esophageal varices (HCC)    GERD (gastroesophageal reflux disease)    History of colon polyps    hyperplastic   History of gastric polyp    Hyperlipidemia    Hypertension    Iron deficiency anemia    Obesity    OSA on CPAP    cpap   Peripheral neuropathy    Pneumonia    april 2020  mild   Primary localized osteoarthritis of left knee 03/27/2019   Sensorineural hearing loss    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HCC)     SURGICAL HISTORY: Past Surgical History:  Procedure Laterality Date   ANKLE SURGERY     Left    BREAST BIOPSY Left 2021   left axilla, lymphoma  COLONOSCOPY  05/2018   ESOPHAGOGASTRODUODENOSCOPY (EGD) WITH PROPOFOL N/A 03/09/2023   Procedure: ESOPHAGOGASTRODUODENOSCOPY (EGD) WITH PROPOFOL;  Surgeon: Iva Boop, MD;  Location: [facility];  Service: Gastroenterology;  Laterality: N/A;   LEEP N/A 09/14/2018   Procedure: LOOP ELECTROSURGICAL EXCISION PROCEDURE (LEEP);  Surgeon: Richardean Chimera, MD;  Location: [facility];  Service: Gynecology;  Laterality: N/A;   MASS EXCISION Right 06/24/2021   Procedure:  EXCISION RIGHT POSTERIOR NECK MASS;  Surgeon: Kinsinger, De Blanch, MD;  Location: [facility];  Service: General;  Laterality: Right;   ROTATOR CUFF REPAIR Bilateral 2012, 2015   TONSILLECTOMY     TOTAL KNEE ARTHROPLASTY Left 04/08/2019   Procedure: TOTAL KNEE ARTHROPLASTY;  Surgeon: Salvatore Marvel, MD;  Location: [facility];  Service: Orthopedics;  Laterality: Left;   UMBILICAL HERNIA REPAIR N/A 10/20/2022   Procedure: OPEN HERNIA REPAIR UMBILICAL ADULT with Mesh;  Surgeon: Kinsinger, De Blanch, MD;  Location: [facility];  Service: General;  Laterality: N/A;   UPPER GI ENDOSCOPY  05/2018    SOCIAL HISTORY: Social History   Socioeconomic History   Marital status: Widowed    Spouse name: Not on file   Number of children: 0   Years of education: 3 years college   Highest education level: Not on file  Occupational History   Occupation: Unemployed  Tobacco Use   Smoking status: Never    Passive exposure: Past   Smokeless tobacco: Never  Vaping Use   Vaping status: Never Used  Substance and Sexual Activity   Alcohol use: Not Currently    Comment: very rare   Drug use: No   Sexual activity: Not Currently    Birth control/protection: None, Post-menopausal  Other Topics Concern   Not on file  Social History Narrative   1 caffeine drink daily    Right-handed   widow (husband died from [place] cirrhosis)   Social Determinants of Health   Financial Resource Strain: Medium Risk (08/20/2020)   Overall Financial Resource Strain (CARDIA)    Difficulty of Paying Living Expenses: Somewhat hard  Food Insecurity: No Food Insecurity (10/20/2022)   Hunger Vital Sign    Worried About Running Out of Food in the Last Year: Never true    Ran Out of Food in the Last Year: Never true  Transportation Needs: No Transportation Needs (10/20/2022)   PRAPARE - Administrator, Civil Service (Medical): No    Lack of Transportation (Non-Medical): No  Physical Activity: Not on file  Stress: Not on file   Social Connections: Unknown (01/25/2022)   [facility]   Social Network    Social Network: Not on file  Intimate Partner Violence: Not At Risk (10/20/2022)   Humiliation, Afraid, Rape, and Kick questionnaire    Fear of Current or Ex-Partner: No    Emotionally Abused: No    Physically Abused: No    Sexually Abused: No    FAMILY HISTORY: Family History  Problem Relation Age of Onset   Breast cancer Mother    Bone cancer Mother    Heart failure Father    Diabetes Mellitus II Father    Hypertension Father    Diabetes Mellitus II Sister    Obesity Sister    Other Sister        retina problem   Hypertension Brother    Diabetes Mellitus II Brother    Colon cancer Neg Hx    Rectal cancer Neg Hx    Stomach cancer Neg Hx  Esophageal cancer Neg Hx    Pancreatic cancer Neg Hx    Liver disease Neg Hx     ALLERGIES:  is allergic to breo ellipta [fluticasone furoate-vilanterol], iodine, latex, tessalon [benzonatate], zocor [simvastatin], levaquin [levofloxacin], penicillins, povidone-iodine, wellbutrin [bupropion], amoxicillin, chlorhexidine, diflucan [fluconazole], thalitone [chlorthalidone], amoxicillin-pot clavulanate, and vibra-tab [doxycycline].  MEDICATIONS:  Current Outpatient Medications  Medication Sig Dispense Refill   albuterol (VENTOLIN HFA) 108 (90 Base) MCG/ACT inhaler Inhale 1-2 puffs into the lungs every 6 (six) hours as needed (Cough). 18 g 0   amLODipine (NORVASC) 10 MG tablet Take 1 tablet (10 mg total) by mouth daily. 90 tablet 3   aspirin EC 81 MG tablet Take 1 tablet (81 mg total) by mouth daily. Swallow whole. 90 tablet 3   azelastine (ASTELIN) 0.1 % nasal spray Place 1 spray into both nostrils 2 (two) times daily. Use in each nostril as directed 9 mL 11   b complex vitamins capsule Take 1 capsule by mouth in the morning.     bacitracin 500 UNIT/GM ointment Apply topically 2 (two) times daily for 10 days 30 g 0   budesonide-formoterol (SYMBICORT)  80-4.5 MCG/ACT inhaler Inhale 2 puffs into the lungs daily. (Patient taking differently: Inhale 2 puffs into the lungs 2 (two) times daily. Patient usually does 2 puffs in morning and 1 puff in evening.) 10.2 g 12   CALCIUM PO Take 1,000 mg by mouth every morning. 500 mg each     Carboxymethylcellul-Glycerin (REFRESH OPTIVE PF OP) Place 1 drop into both eyes 3 (three) times daily as needed (dry/irritated eyes.). Non-pres     cephALEXin (KEFLEX) 500 MG capsule Take 2 capsules (1,000 mg total) by mouth as directed 1 hour prior to dental work. 20 capsule 0   cetirizine (ZYRTEC) 10 MG tablet Take 5 mg by mouth in the morning.     Coenzyme Q10 (CO Q-10) 100 MG CAPS Take 100 mg by mouth in the morning.     Continuous Glucose Sensor (FREESTYLE LIBRE 2 SENSOR) MISC Use to monitor blood glucose continuously - change every 14 days. 6 each 3   desoximetasone (TOPICORT) 0.25 % cream Apply a small amount to skin twice a day as needed for flares 60 g 0   diclofenac sodium (VOLTAREN) 1 % GEL Apply 2 g topically 2 (two) times daily as needed (knee pain/ foot /Triger finger).  2   empagliflozin (JARDIANCE) 10 MG TABS tablet Take 10 mg by mouth in the morning.     fluticasone (FLONASE) 50 MCG/ACT nasal spray Place 2 sprays into both nostrils in the morning. (Patient taking differently: Place 2 sprays into both nostrils daily as needed for allergies.) 48 g 2   furosemide (LASIX) 20 MG tablet Take 1 tablet (20 mg total) by mouth daily. 90 tablet 3   gabapentin (NEURONTIN) 300 MG capsule Take 1 capsule by mouth twice a day (Patient taking differently: Take 600 mg by mouth at bedtime.) 180 capsule 3   glucose blood test strip Use to test blood sugar 3 times daily as directed 300 each 3   hydrALAZINE (APRESOLINE) 25 MG tablet Take 1 tablet (25 mg total) by mouth 2 (two) times daily. 180 tablet 2   insulin aspart (NOVOLOG FLEXPEN) 100 UNIT/ML FlexPen inject 30 units under the skin with breakfast, 20 units with lunch, and 10  units with supper (Patient taking differently: Inject 30 Units into the skin with breakfast, with lunch, and with evening meal.) 30 mL 11   insulin aspart (  NOVOLOG FLEXPEN) 100 UNIT/ML FlexPen Inject 30 Units into the skin daily with breakfast AND 30 Units daily with lunch AND 30 Units daily with supper. 90 mL 3   insulin degludec (TRESIBA FLEXTOUCH) 200 UNIT/ML FlexTouch Pen Inject 80 Units into the skin daily. 30 mL 11   Insulin Pen Needle (BD PEN NEEDLE NANO 2ND GEN) 32G X 4 MM MISC Use as directed 6 times daily 400 each 5   irbesartan (AVAPRO) 300 MG tablet Take 1 tablet (300 mg total) by mouth in the morning. 90 tablet 3   Lancets (ONETOUCH DELICA PLUS LANCET33G) MISC Use to test blood sugar 3 times daily 300 each 2   LORazepam (ATIVAN) 0.5 MG tablet Take 1-2 tablets (0.5-1 mg total) by mouth once as needed for up to 1 dose (prior to MRI or CT scan for claustrophobia). (Patient not taking: Reported on 03/06/2023) 4 tablet 0   metFORMIN (GLUCOPHAGE-XR) 500 MG 24 hr tablet Take 2 tablets (1,000 mg total) by mouth 2 (two) times daily. 360 tablet 3   omeprazole (PRILOSEC) 40 MG capsule Take 1 capsule by mouth twice daily 180 capsule 3   ondansetron (ZOFRAN-ODT) 4 MG disintegrating tablet Take 1 tablet (4 mg total) by mouth every 8 (eight) hours as needed for nausea or vomiting. 20 tablet 0   rosuvastatin (CRESTOR) 10 MG tablet Take 1 tablet (10 mg total) by mouth daily. (Patient taking differently: Take 10 mg by mouth at bedtime.) 90 tablet 2   sertraline (ZOLOFT) 50 MG tablet Take 1 tablet (50 mg total) by mouth daily. (Patient taking differently: Take 50 mg by mouth every evening.) 90 tablet 3   spironolactone (ALDACTONE) 50 MG tablet Take 1 tablet (50 mg total) by mouth daily. 90 tablet 3   terconazole (TERAZOL 3) 0.8 % vaginal cream Place 1 applicator vaginally daily as needed (yeast infections).     No current facility-administered medications for this visit.    REVIEW OF SYSTEMS:    10  Point review of Systems was done is negative except as noted above.   PHYSICAL EXAMINATION: .BP (!) 137/58   Pulse 86   Temp 98.1 F (36.7 C)   Resp 18   Wt 217 lb 3.2 oz (98.5 kg)   SpO2 96%   BMI 37.28 kg/m    GENERAL:alert, in no acute distress and comfortable SKIN: no acute rashes, no significant lesions EYES: conjunctiva are pink and non-injected, sclera anicteric OROPHARYNX: MMM, no exudates, no oropharyngeal erythema or ulceration NECK: supple, no JVD LYMPH:  no palpable lymphadenopathy in the cervical, axillary or inguinal regions LUNGS: clear to auscultation b/l with normal respiratory effort HEART: regular rate & rhythm ABDOMEN:  normoactive bowel sounds , non tender, not distended. Extremity: no pedal edema PSYCH: alert & oriented x 3 with fluent speech NEURO: no focal motor/sensory deficits   LABORATORY DATA:  I have reviewed the data as listed  .    Latest Ref Rng & Units 02/22/2023   11:27 AM 12/02/2022   11:33 AM 10/20/2022   11:48 AM  CBC  WBC 4.0 - 10.5 K/uL 54.2  42.0  56.9   Hemoglobin 12.0 - 15.0 g/dL 9.1  13.2  44.0   Hematocrit 36.0 - 46.0 % 29.1  34.8  34.9   Platelets 150 - 400 K/uL 93  112  108    . CBC    Component Value Date/Time   WBC 54.2 (HH) 02/22/2023 1127   WBC 56.9 (HH) 10/20/2022 1148   RBC 3.13 (  L) 02/22/2023 1127   HGB 9.1 (L) 02/22/2023 1127   HGB 10.3 (L) 10/18/2021 1222   HCT 29.1 (L) 02/22/2023 1127   HCT 33.0 (L) 10/18/2021 1222   PLT 93 (L) 02/22/2023 1127   PLT 100 (LL) 10/18/2021 1222   MCV 93.0 02/22/2023 1127   MCV 84 10/18/2021 1222   MCH 29.1 02/22/2023 1127   MCHC 31.3 02/22/2023 1127   RDW 17.0 (H) 02/22/2023 1127   RDW 15.1 10/18/2021 1222   LYMPHSABS 46.3 (H) 02/22/2023 1127   MONOABS 2.8 (H) 02/22/2023 1127   EOSABS 0.2 02/22/2023 1127   BASOSABS 0.2 (H) 02/22/2023 1127     .    Latest Ref Rng & Units 02/22/2023   11:27 AM 12/19/2022   12:47 PM 12/12/2022    5:04 PM  CMP  Glucose 70 - 99 mg/dL 865   784  696   BUN 8 - 23 mg/dL 19  17  12    Creatinine 0.44 - 1.00 mg/dL 2.95  2.84  1.32   Sodium 135 - 145 mmol/L 138  138  132   Potassium 3.5 - 5.1 mmol/L 4.5  4.2  4.3   Chloride 98 - 111 mmol/L 105  105  103   CO2 22 - 32 mmol/L 26  25  24    Calcium 8.9 - 10.3 mg/dL 9.0  9.3  8.7   Total Protein 6.5 - 8.1 g/dL 6.6     Total Bilirubin 0.3 - 1.2 mg/dL 0.4     Alkaline Phos 38 - 126 U/L 64     AST 15 - 41 U/L 21     ALT 0 - 44 U/L 17      . Lab Results  Component Value Date   LDH 194 (H) 02/22/2023   . Lab Results  Component Value Date   IRON 29 06/03/2022   TIBC 529 (H) 06/03/2022   IRONPCTSAT 6 (L) 06/03/2022   (Iron and TIBC)  Lab Results  Component Value Date   FERRITIN 10 (L) 02/22/2023    04/08/2020 FISH/CLL Panel:   03/30/2020 Left Axilla Flow Pathology Report [id number]):   03/30/2020 Left axilla lymph node Bx ([id number]):   RADIOGRAPHIC STUDIES: I have personally reviewed the radiological images as listed and agreed with the findings in the report. PCV ECHOCARDIOGRAM COMPLETE  Result Date: 03/30/2023 Echocardiogram 03/28/2023: Left ventricle cavity is normal in size. Mild concentric hypertrophy of the left ventricle. Normal global wall motion. Normal LV systolic function with EF 65%. Doppler evidence of grade I (impaired) diastolic dysfunction, normal LAP. Left atrial cavity is moderately dilated. Structurally normal trileaflet aortic valve. Mild (Grade I) aortic regurgitation. Mild mitral valve leaflet thickening with mild calcification. Trace mitral regurgitation. Mild tricuspid regurgitation. No evidence of pulmonary hypertension.   NM PET Image Restag (PS) Skull Base To Thigh  Result Date: 03/25/2023 CLINICAL DATA:  Subsequent treatment strategy for CLL. EXAM: NUCLEAR MEDICINE PET SKULL BASE TO THIGH TECHNIQUE: 10.9 mCi F-18 FDG was injected intravenously. Full-ring PET imaging was performed from the skull base to thigh after the radiotracer. CT data  was obtained and used for attenuation correction and anatomic localization. Fasting blood glucose: 148 mg/dl COMPARISON:  CT neck dated 07/21/2022.  PET-CT dated 06/22/2022. FINDINGS: Mediastinal blood pool activity: SUV max 2.6 Liver activity: SUV max 4.4 NECK: Bilateral cervical lymphadenopathy, including 18 mm short axis left level 2 node (series 4/image 29), max SUV 2.8. These are unchanged from recent CT neck but mildly progressive from prior PET-CT. Incidental  CT findings: None. CHEST: Thoracic lymphadenopathy, including prominent supraclavicular and bilateral axillary nodes, including a dominant 2.3 cm short axis right axillary node (series 4/image 59) with max SUV 2.6 and a 2.2 cm short axis left axillary node (series 4/image 66) with max SUV 2.9. These are stable versus mildly progressive from prior PET-CT. No hypermetabolic pulmonary nodules. Incidental CT findings: Atherosclerotic calcifications of the aortic arch. Mild coronary atherosclerosis of the LAD and left circumflex. ABDOMEN/PELVIS: Upper abdominal and retroperitoneal lymphadenopathy, including a dominant 3.3 cm short axis portacaval node (series 4/image 121) with max SUV 3.3 and a 2.4 cm short axis left para-aortic node (series 4/image 124) with max SUV 3.8. These are mildly progressive from prior PET-CT. Jejunal mesenteric nodes measuring up to 18 mm short axis (series 4/image 141), max SUV 2.7. Bilateral pelvic lymphadenopathy, including a 2.6 cm short axis left external iliac node (series 4/image 132) with max SUV 2.2 and a 2.4 cm short axis right external iliac node (series 4/image 173) with max SUV 3.0. These are mildly progressive from prior PET-CT. Splenomegaly, measuring approximately 25 cm in maximal craniocaudal dimension, unchanged. No focal lesion/hypermetabolism. Reference max splenic SUV 3.3. Cirrhosis, without focal lesion/hypermetabolism. No abnormal hypermetabolism in the pancreas or adrenal glands. Incidental CT findings:  Atherosclerotic calcifications abdominal aorta and branch vessels. Extensive sigmoid diverticulosis, without evidence of diverticulitis. Trace pelvic ascites. SKELETON: No focal hypermetabolic activity to suggest skeletal metastasis. Incidental CT findings: Degenerative changes of the visualized thoracolumbar spine. IMPRESSION: Extensive lymphadenopathy in the neck, chest, abdomen, and pelvis, as above, mildly progressive from prior PET-CT. Deauville criteria 3. Splenomegaly, unchanged. No focal lesion/hypermetabolism. Cirrhosis, without focal lesion/hypermetabolism. Trace pelvic ascites. Electronically Signed   By: Charline Bills M.D.   On: 03/25/2023 00:21   DG SWALLOW FUNC OP MEDICARE SPEECH PATH  Result Date: 03/23/2023 Table formatting from the original result was not included. Modified Barium Swallow Study Patient Details Name: Janice [name] MRN: [id number] Date of Birth: 13-Jun-1951 Today's Date: 03/23/2023 HPI/PMH: HPI: pt is a 72 yo female referred by Dr Candise Che for MBS due to pt report of dysphagia.  PMH + for DDD, asthma, upper airway syndrome, NASH with liver cirrhosis, moderate aortic regurgitation, small lymphocitic lymphoma (CLL), esophageal varisces with bleeding, DM, DOE, obesity,portal hypertensive gastropathy, NASH with liver cirrhosis, OSA, diverticulosis, anemia.   Pt takes omeprazole daily - as prescribed.  She has seen ent in the past and was scoped February 2024 due to her chronic throat clearing - reports nothing specific noted on endoscopy.  PET 7/8 showed mild enlarged palatine tonsils, bilateral tonsil stones, enlarged cervical mediastinal subpectoral lymph noes similar to 06/2022.  CXR 07/2022 mild left lung scarring.   Pt reports difficutly initiating a swallow - having to push on right thyroid cartilage to initiate swallow.  States this has improved since her lymph nodes have decreased in size.  Denies pneumonias since getting IVIG weekly, nor unintentional weight loss, or  requiring heimlich manuever. Pill dysphagia only to large pills, chronic issues with throat clearing for which she has seen ENT and SLP.   Xerostomia is significant. Clinical Impression: Clinical Impression: Patient presents with normal oropharyngeal swallow function - no aspiration or frank laryngeal penetration observed. Pt with strong and timely swallow.  Difficulty transiting tablet with thin noted - requiring several boluses of water and head extension to clear.  Pt's symptoms of difficulty initiating a swallow was replicated on today's study. Educated pt to findngs reviewing study flouro loops.  Thanks for this referral.  Of note, pt with frequent throat clearing before, during and after MBS. Factors that may increase risk of adverse event in presence of aspiration Rubye Oaks & Clearance Coots 2021): Factors that may increase risk of adverse event in presence of aspiration Rubye Oaks & Clearance Coots 2021): Reduced saliva Recommendations/Plan: Swallowing Evaluation Recommendations Swallowing Evaluation Recommendations Recommendations: PO diet PO Diet Recommendation: Regular; Thin liquids (Level 0) Liquid Administration via: Cup; Straw Medication Administration: Whole meds with liquid Supervision: Patient able to self-feed Swallowing strategies  : Small bites/sips; Slow rate (drink liquid t/o meal) Postural changes: Position pt fully upright for meals; Stay upright 30-60 min after meals Oral care recommendations: Oral care BID (2x/day) Treatment Plan No data recorded Recommendations Recommendations for follow up therapy are one component of a multi-disciplinary discharge planning process, led by the attending physician.  Recommendations may be updated based on patient status, additional functional criteria and insurance authorization. Assessment: Orofacial Exam: Orofacial Exam Oral Cavity: Oral Hygiene: WFL; Xerostomia Orofacial Anatomy: WFL Oral Motor/Sensory Function: WFL Anatomy: Anatomy: -- (appearance of enlarged palatine  tonsils - noted on PET scan) Boluses Administered: Boluses Administered Boluses Administered: Thin liquids (Level 0); Mildly thick liquids (Level 2, nectar thick); Moderately thick liquids (Level 3, honey thick); Puree; Solid  Oral Impairment Domain: Oral Impairment Domain Lip Closure: No labial escape Tongue control during bolus hold: Cohesive bolus between tongue to palatal seal Bolus preparation/mastication: Timely and efficient chewing and mashing Bolus transport/lingual motion: Brisk tongue motion Oral residue: Trace residue lining oral structures Location of oral residue : Tongue Initiation of pharyngeal swallow : Posterior laryngeal surface of the epiglottis  Pharyngeal Impairment Domain: Pharyngeal Impairment Domain Soft palate elevation: No bolus between soft palate (SP)/pharyngeal wall (PW) Laryngeal elevation: Complete superior movement of thyroid cartilage with complete approximation of arytenoids to epiglottic petiole Anterior hyoid excursion: Complete anterior movement Epiglottic movement: Complete inversion Laryngeal vestibule closure: Complete, no air/contrast in laryngeal vestibule Pharyngeal stripping wave : Present - complete Pharyngeal contraction (A/P view only): N/A Pharyngoesophageal segment opening: Partial distention/partial duration, partial obstruction of flow Tongue base retraction: Trace column of contrast or air between tongue base and PPW Pharyngeal residue: Trace residue within or on pharyngeal structures Location of pharyngeal residue: Tongue base  Esophageal Impairment Domain: Esophageal Impairment Domain Esophageal clearance upright position: Complete clearance, esophageal coating Pill: Pill Consistency administered: Thin liquids (Level 0) Thin liquids (Level 0): Impaired (see clinical impressions) Penetration/Aspiration Scale Score: Penetration/Aspiration Scale Score 1.  Material does not enter airway: Mildly thick liquids (Level 2, nectar thick); Moderately thick liquids (Level  3, honey thick); Puree; Solid; Pill 2.  Material enters airway, remains ABOVE vocal cords then ejected out: Thin liquids (Level 0) Compensatory Strategies: No data recorded  General Information: Caregiver present: No  Diet Prior to this Study: Regular; Thin liquids (Level 0)   Temperature : Normal   Respiratory Status: WFL   Supplemental O2: None (Room air)   History of Recent Intubation: No  Behavior/Cognition: Alert; Cooperative; Pleasant mood Self-Feeding Abilities: Able to self-feed Baseline vocal quality/speech: Normal Volitional Cough: Able to elicit Volitional Swallow: Able to elicit Exam Limitations: No limitations Goal Planning: No data recorded No data recorded No data recorded No data recorded No data recorded Pain: No data recorded End of Session: Start Time:SLP Start Time (ACUTE ONLY): 1325 Stop Time: SLP Stop Time (ACUTE ONLY): 1350 Time Calculation:SLP Time Calculation (min) (ACUTE ONLY): 25 min Charges: SLP Evaluations $ SLP Speech Visit: 1 Visit SLP Evaluations $Outpatient MBS Swallow: 1 Procedure SLP visit diagnosis: SLP Visit  Diagnosis: Dysphagia, unspecified (R13.10) Past Medical History: Past Medical History: Diagnosis Date  Allergy   Asthma   seasonal  Cellulitis 2013  Left toe  Chronic kidney disease   stage 1  Cirrhosis (HCC)   Common migraine   History of  Complication of anesthesia   Per pt/had breathing problems with "block" during rotator cuff surgery. 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  Coronary artery disease   DDD (degenerative disc disease)   Depression   denies takes paxil for migraines  Diabetes mellitus   type 2  Diabetic peripheral neuropathy (HCC)   Difficult airway for intubation 10/20/2022  DLx 1 with MAC 4, unable to see cords, DL with lopro S3 glidescope, easy atraumatic intubation  Difficult intubation 03/03/2023  Diverticulitis   Diverticulosis   DJD (degenerative joint disease)   Esophageal varices (HCC)   GERD (gastroesophageal reflux disease)   History of colon polyps    hyperplastic  History of gastric polyp   Hyperlipidemia   Hypertension   Iron deficiency anemia   Obesity   OSA on CPAP   cpap  Peripheral neuropathy   Pneumonia   april 2020  mild  Primary localized osteoarthritis of left knee 03/27/2019  Sensorineural hearing loss   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HCC)  Past Surgical History: Past Surgical History: Procedure Laterality Date  ANKLE SURGERY    Left   BREAST BIOPSY Left 2021  left axilla, lymphoma  COLONOSCOPY  05/2018  ESOPHAGOGASTRODUODENOSCOPY (EGD) WITH PROPOFOL N/A 03/09/2023  Procedure: ESOPHAGOGASTRODUODENOSCOPY (EGD) WITH PROPOFOL;  Surgeon: Iva Boop, MD;  Location: [facility];  Service: Gastroenterology;  Laterality: N/A;  LEEP N/A 09/14/2018  Procedure: LOOP ELECTROSURGICAL EXCISION PROCEDURE (LEEP);  Surgeon: Richardean Chimera, MD;  Location: [facility];  Service: Gynecology;  Laterality: N/A;  MASS EXCISION Right 06/24/2021  Procedure: EXCISION RIGHT POSTERIOR NECK MASS;  Surgeon: Kinsinger, De Blanch, MD;  Location: [facility];  Service: General;  Laterality: Right;  ROTATOR CUFF REPAIR Bilateral 2012, 2015  TONSILLECTOMY    TOTAL KNEE ARTHROPLASTY Left 04/08/2019  Procedure: TOTAL KNEE ARTHROPLASTY;  Surgeon: Salvatore Marvel, MD;  Location: [facility];  Service: Orthopedics;  Laterality: Left;  UMBILICAL HERNIA REPAIR N/A 10/20/2022  Procedure: OPEN HERNIA REPAIR UMBILICAL ADULT with Mesh;  Surgeon: Kinsinger, De Blanch, MD;  Location: [facility];  Service: General;  Laterality: N/A;  UPPER GI ENDOSCOPY  05/2018 Chales Abrahams 03/23/2023, 3:55 PM Rolena Infante, MS [facility] SLP Acute Rehab Services Office [phone] CLINICAL DATA:  Dysphagia. Constant throat clearing. EXAM: MODIFIED BARIUM SWALLOW TECHNIQUE: Different consistencies of barium were administered orally to the patient by the Speech Pathologist. Imaging of the pharynx was performed in the lateral projection. The radiologist was present in the fluoroscopy room for this study, providing personal  supervision. FLUOROSCOPY: Radiation Exposure Index (as provided by the fluoroscopic device): 5.9 mGy Kerma COMPARISON:  None Available. FINDINGS: Vestibular  Penetration:  None seen. Aspiration:  None seen. Other:  None. IMPRESSION: No evidence of vestibular penetration or aspiration. Please refer to the Speech Pathologists report for complete details and recommendations. Electronically Signed   By: Danae Orleans M.D.   On: 03/23/2023 14:13   MRI abdomen with and without contrast 07/08/2021 showed IMPRESSION: 1. Moderate motion and patient body habitus degraded exam. 2. Extensive abdominal adenopathy, similar to slightly increased compared to 05/01/2020 PET. 3. Cirrhosis and hepatosplenomegaly with suspicion of periesophageal varices. 4. Nonspecific hypoenhancing or nonenhancing splenic lesions. These could simply represent Hessie Knows bodies in the setting of cirrhosis. Lymphomatous  involvement cannot be excluded. 5. Heterogeneous enhancement throughout the liver, without well-defined suspicious mass. Possibly due to altered perfusion in the setting of cirrhosis and porta hepatis adenopathy. Lymphomatous involvement cannot be excluded. Consider pre and post-contrast MRI follow-up at 2-3 months. A more aggressive approach would include random liver biopsy.  MRI abdomen with and without contrast 02/04/2022 showed IMPRESSION: 1. Examination is generally limited by breath motion artifact, particularly multiphasic contrast enhanced sequences. Within this limitation, cirrhotic morphology of the liver without focal liver lesion. 2. 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 3. Trace ascites. 4. Numerous enlarged gastrohepatic ligament, portacaval, retroperitoneal, and small bowel mesenteric lymph nodes are unchanged, in keeping with history of lymphoma.  ASSESSMENT & PLAN:   72 yo female with    1) CLL/SLL -02/28/2020 MM Breast ([id number]) revealed "Bilateral axillary adenopathy." -03/30/2020 Left axilla lymph node biopsy ([id number]) revealed "CHRONIC LYMPHOCYTIC LYMPHOMA/SMALL LYMPHOCYTIC LYMPHOMA".  -03/30/2020 Left Axilla Flow Pathology Report [id number]) revealed "Monoclonal B-cell population with coexpression of CD5 comprises 80% of all lymphocytes". -04/20/20 PET/CT revealed 1. Adenopathy within the neck, chest, abdomen, and pelvis, consistent with active lymphoma. Much of this is not significantly hypermetabolic. Some upper abdominal nodes are moderately hypermetabolic.  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 NO overt GI bleeding noted. . Lab Results  Component Value Date   IRON 29 06/03/2022   TIBC 529 (H) 06/03/2022   IRONPCTSAT 6 (L) 06/03/2022   (Iron and TIBC)  Lab Results  Component Value Date   FERRITIN 10 (L) 02/22/2023    PLAN:   -PET scan discussed -which showed enlarged lymph nodes in upper neck, in bilateral axillaries, central chest, pelvis and groin. No obvious findings in lungs. Enlarged spleen is present. No obvious masses in liver  -Swallow study showed no obvious signs of aspiration or swallowing issues -discussed results of upper GI endoscopy which showed small varices and portal hypertension which was moderate. Findings suggestive of congested stomach, making the lining of the stomach more delicate and increases the risk of internal bleeding -educated patient on signs of portal hypertension which may cause varices or  enlarged spleen -discussed potential reasons to initiate treatment for CLL/SLL: Affecting blood counts and causing anemia - patient is anemic with hemoglobin low at 9.1. Anemia may be due to blood loss.  Fatigue - Liver cirrhosis and other medical issues may be playing into fatigue.  Bothersome lymph nodes in the neck -patient would like to proceed with receiving treatment for CLL/SLL at this time -will plan to start treatment in first/second week of August -Discussed details of combination treatment involving Gazyva and Venetoclax.  -will set up patient for education session to discuss details of treatment -start additional IV Venofer 300 mg weekly x 3 doses with Claritin Tylenol and Pepcid as premedications  -continue vitamin B complex supplements -Educated patient that IV iron may improve energy levels and hemoglobin  levels -continue IVIG replacement monthly 400 mg/kg -IVIG typically would not cause leg pain. If LE pain is persistent, would recommend patient to connect with PCP for further evaluation. -patient's tonsil issues may flare lymph nodes from time to time. If this is chronically bothersome, it would be a reason to initiate treatment for CLL/SLL -advised patient to avoid ibuprofen and NSAIDS as it may increase the risk of ulcers or bleeding -recommend Preparation H for hemorrhoidal relief -will order blood tests today for further evaluation -answered all of patient's questions in detail regarding treatment, imaging and symptoms  FOLLOW-UP: Chemo-counseling for Gazyva/Venetoclax in 2 weeks IV venofer as scheduled Continue IVIG every 4 weeks x 6 RTC with Dr Candise Che with labs in 4 weeks and with appointment for [facility] per orders  The total time spent in the appointment was 41 minutes* .  All of the patient's questions were answered with apparent satisfaction. The patient knows to call the clinic with any problems, questions or concerns.   Wyvonnia Lora MD MS AAHIVMS [facility]  [facility] Hematology/Oncology Physician [facility]  .*Total Encounter Time as defined by the Centers for Medicare and Medicaid Services includes, in addition to the face-to-face time of a patient visit (documented in the note above) non-face-to-face time: obtaining and reviewing outside history, ordering and reviewing medications, tests or procedures, care coordination (communications with other health care professionals or caregivers) and documentation in the medical record.    I,Mitra Faeizi,acting as a Neurosurgeon for Wyvonnia Lora, MD.,have documented all relevant documentation on the behalf of Wyvonnia Lora, MD,as directed by  Wyvonnia Lora, MD while in the presence of Wyvonnia Lora, MD.  .I have reviewed the above documentation for accuracy and completeness, and I agree with the above. Johney Maine MD

## 2023-03-28 ENCOUNTER — Ambulatory Visit: Payer: PPO

## 2023-03-28 DIAGNOSIS — I6523 Occlusion and stenosis of bilateral carotid arteries: Secondary | ICD-10-CM

## 2023-03-28 DIAGNOSIS — I351 Nonrheumatic aortic (valve) insufficiency: Secondary | ICD-10-CM

## 2023-03-28 DIAGNOSIS — I5189 Other ill-defined heart diseases: Secondary | ICD-10-CM | POA: Diagnosis not present

## 2023-03-29 ENCOUNTER — Inpatient Hospital Stay: Payer: PPO

## 2023-03-29 ENCOUNTER — Other Ambulatory Visit: Payer: Self-pay

## 2023-03-29 ENCOUNTER — Encounter: Payer: Self-pay | Admitting: Hematology

## 2023-03-29 VITALS — BP 105/72 | HR 71 | Temp 97.8°F | Resp 18 | Wt 214.4 lb

## 2023-03-29 DIAGNOSIS — C8300 Small cell B-cell lymphoma, unspecified site: Secondary | ICD-10-CM

## 2023-03-29 DIAGNOSIS — D801 Nonfamilial hypogammaglobulinemia: Secondary | ICD-10-CM | POA: Diagnosis not present

## 2023-03-29 MED ORDER — ACETAMINOPHEN 325 MG PO TABS
650.0000 mg | ORAL_TABLET | Freq: Once | ORAL | Status: AC
  Administered 2023-03-29: 650 mg via ORAL
  Filled 2023-03-29: qty 2

## 2023-03-29 MED ORDER — SODIUM CHLORIDE 0.9 % IV SOLN
300.0000 mg | Freq: Once | INTRAVENOUS | Status: AC
  Administered 2023-03-29: 300 mg via INTRAVENOUS
  Filled 2023-03-29: qty 300

## 2023-03-29 MED ORDER — SODIUM CHLORIDE 0.9 % IV SOLN: Freq: Once | INTRAVENOUS | Status: AC

## 2023-03-29 MED ORDER — LORATADINE 10 MG PO TABS
10.0000 mg | ORAL_TABLET | Freq: Once | ORAL | Status: AC
  Administered 2023-03-29: 10 mg via ORAL
  Filled 2023-03-29: qty 1

## 2023-03-29 NOTE — Patient Instructions (Signed)
Iron Sucrose Injection What is this medication? IRON SUCROSE (EYE ern SOO krose) treats low levels of iron (iron deficiency anemia) in people with kidney disease. Iron is a mineral that plays an important role in making red blood cells, which carry oxygen from your lungs to the rest of your body. This medicine may be used for other purposes; ask your health care provider or pharmacist if you have questions. COMMON BRAND NAME(S): Venofer What should I tell my care team before I take this medication? They need to know if you have any of these conditions: Anemia not caused by low iron levels Heart disease High levels of iron in the blood Kidney disease Liver disease An unusual or allergic reaction to iron, other medications, foods, dyes, or preservatives Pregnant or trying to get pregnant Breastfeeding How should I use this medication? This medication is for infusion into a vein. It is given in a hospital or clinic setting. Talk to your care team about the use of this medication in children. While this medication may be prescribed for children as young as 2 years for selected conditions, precautions do apply. Overdosage: If you think you have taken too much of this medicine contact a poison control center or emergency room at once. NOTE: This medicine is only for you. Do not share this medicine with others. What if I miss a dose? Keep appointments for follow-up doses. It is important not to miss your dose. Call your care team if you are unable to keep an appointment. What may interact with this medication? Do not take this medication with any of the following: Deferoxamine Dimercaprol Other iron products This medication may also interact with the following: Chloramphenicol Deferasirox This list may not describe all possible interactions. Give your health care provider a list of all the medicines, herbs, non-prescription drugs, or dietary supplements you use. Also tell them if you smoke,  drink alcohol, or use illegal drugs. Some items may interact with your medicine. What should I watch for while using this medication? Visit your care team regularly. Tell your care team if your symptoms do not start to get better or if they get worse. You may need blood work done while you are taking this medication. You may need to follow a special diet. Talk to your care team. Foods that contain iron include: whole grains/cereals, dried fruits, beans, or peas, leafy green vegetables, and organ meats (liver, kidney). What side effects may I notice from receiving this medication? Side effects that you should report to your care team as soon as possible: Allergic reactions--skin rash, itching, hives, swelling of the face, lips, tongue, or throat Low blood pressure--dizziness, feeling faint or lightheaded, blurry vision Shortness of breath Side effects that usually do not require medical attention (report to your care team if they continue or are bothersome): Flushing Headache Joint pain Muscle pain Nausea Pain, redness, or irritation at injection site This list may not describe all possible side effects. Call your doctor for medical advice about side effects. You may report side effects to FDA at 1-800-FDA-1088. Where should I keep my medication? This medication is given in a hospital or clinic and will not be stored at home. NOTE: This sheet is a summary. It may not cover all possible information. If you have questions about this medicine, talk to your doctor, pharmacist, or health care provider.  2024 Elsevier/Gold Standard (2022-03-09 00:00:00)  

## 2023-03-30 ENCOUNTER — Other Ambulatory Visit (HOSPITAL_COMMUNITY): Payer: Self-pay

## 2023-03-30 MED ORDER — JARDIANCE 10 MG PO TABS
10.0000 mg | ORAL_TABLET | Freq: Every day | ORAL | 3 refills | Status: DC
  Filled 2023-03-30: qty 30, 30d supply, fill #0
  Filled 2023-12-09: qty 90, 90d supply, fill #0
  Filled 2024-03-05: qty 90, 90d supply, fill #1

## 2023-03-31 ENCOUNTER — Telehealth: Payer: Self-pay | Admitting: Pharmacy Technician

## 2023-03-31 ENCOUNTER — Encounter: Payer: Self-pay | Admitting: Hematology

## 2023-03-31 ENCOUNTER — Telehealth: Payer: Self-pay | Admitting: Pharmacist

## 2023-03-31 ENCOUNTER — Other Ambulatory Visit (HOSPITAL_COMMUNITY): Payer: Self-pay

## 2023-03-31 ENCOUNTER — Other Ambulatory Visit: Payer: Self-pay

## 2023-03-31 DIAGNOSIS — C9110 Chronic lymphocytic leukemia of B-cell type not having achieved remission: Secondary | ICD-10-CM | POA: Insufficient documentation

## 2023-03-31 MED ORDER — ALLOPURINOL 300 MG PO TABS
150.0000 mg | ORAL_TABLET | Freq: Every day | ORAL | 0 refills | Status: DC
Start: 2023-03-31 — End: 2023-04-06
  Filled 2023-03-31: qty 30, 60d supply, fill #0

## 2023-03-31 MED ORDER — VENETOCLAX 10 & 50 & 100 MG PO TBPK
ORAL_TABLET | ORAL | 0 refills | Status: DC
Start: 2023-03-31 — End: 2023-04-28
  Filled 2023-03-31: qty 42, fill #0
  Filled 2023-04-25: qty 42, 28d supply, fill #0

## 2023-03-31 MED ORDER — ONDANSETRON HCL 8 MG PO TABS
8.0000 mg | ORAL_TABLET | Freq: Three times a day (TID) | ORAL | 1 refills | Status: DC | PRN
  Filled 2023-03-31: qty 30, 10d supply, fill #0

## 2023-03-31 MED ORDER — PROCHLORPERAZINE MALEATE 10 MG PO TABS
10.0000 mg | ORAL_TABLET | Freq: Four times a day (QID) | ORAL | 1 refills | Status: DC | PRN
  Filled 2023-03-31 – 2023-04-07 (×2): qty 30, 8d supply, fill #0

## 2023-03-31 MED ORDER — ACYCLOVIR 400 MG PO TABS
400.0000 mg | ORAL_TABLET | Freq: Every day | ORAL | 5 refills | Status: DC
Start: 2023-03-31 — End: 2023-04-28
  Filled 2023-03-31 – 2023-04-24 (×2): qty 30, 30d supply, fill #0

## 2023-03-31 NOTE — Progress Notes (Signed)
START ON PATHWAY REGIMEN - Lymphoma and CLL     Cycle 1: A cycle is 28 days:     Venetoclax      Obinutuzumab      Obinutuzumab      Obinutuzumab    Cycle 2: A cycle is 28 days:     Venetoclax      Venetoclax      Venetoclax      Venetoclax      Obinutuzumab    Cycles 3 through 6: A cycle is 28 days:     Venetoclax      Obinutuzumab    Cycles 7 through 12: A cycle is 28 days:     Venetoclax   **Always confirm dose/schedule in your pharmacy ordering system**  Patient Characteristics: Chronic Lymphocytic Leukemia (CLL), Treatment Indicated, First Line Disease Type: Chronic Lymphocytic Leukemia (CLL) Disease Type: Not Applicable Disease Type: Not Applicable Treatment Indicated<= Treatment Indicated Line of Therapy: First Line Intent of Therapy: Non-Curative / Palliative Intent, Discussed with Patient

## 2023-03-31 NOTE — Telephone Encounter (Addendum)
Oral Oncology Pharmacist Encounter  Received new prescription for Venclexta (venetoclax) for the treatment of CLL in conjunction with Gazyva, planned duration of Venclexta 1 year or until unacceptable drug toxicity. Venclexta earliest start date would be day 22 of cycle 1 (05/18/23).   CBC w/ Diff and CMP from 02/22/23 assessed, patient with WBC 54.2 K/uL (secondary to CLL), Hgb 9.1 g/dL and pltc 93 K/uL. No baseline dose adjustments required at this time. Prescription dose and frequency assessed for appropriateness.  TLS labs per MD - noted baseline uric acid orders have been inputted - will follow up on these results prior to patient starting venetoclax.   Current medication list in Epic reviewed, no relevant/significant DDIs with Venclexta identified.  Of note, patient had glucose of 203 mg/dL on 1/61/09 - venetoclax can cause hyperglycemia. Recommend monitoring glucose and adjusting anti-glycemic agents as needed while on therapy.    Evaluated chart and no patient barriers to medication adherence noted.   Prescription has been e-scribed to the [facility] for benefits analysis and approval.  Oral Oncology Clinic will continue to follow for insurance authorization, copayment issues, initial counseling and start date.  Lenord Carbo, PharmD, BCPS, [facility] Hematology/Oncology Clinical Pharmacist [facility] and [facility] Oral Chemotherapy Navigation Clinics [phone] 03/31/2023 9:38 AM

## 2023-03-31 NOTE — Telephone Encounter (Signed)
Oral Oncology Patient Advocate Encounter   Was successful in securing patient a $ 4,500 grant from [employer] ([employer] to provide copayment coverage for venclexta.  This will keep the out of pocket expense at $0.     I have spoken with the patient.  The billing information is as follows and has been shared with [facility].   Member ID: [id number] Group ID: [id number]  RxBin: [id number] Dates of Eligibility: 03/31/23 through 03/30/24  Fund:  CLL  Jinger Neighbors, CPhT-Adv Oncology Pharmacy Patient Advocate [facility] Direct Number: [phone]  Fax: [phone]

## 2023-03-31 NOTE — Telephone Encounter (Signed)
Oral Oncology Patient Advocate Encounter   Received notification that prior authorization for Venclexta is required.   PA submitted on 03/31/23 Key [id number] Status is pending     Jinger Neighbors, CPhT-Adv Oncology Pharmacy Patient Advocate [facility] Direct Number: [phone]  Fax: [phone]

## 2023-04-03 ENCOUNTER — Other Ambulatory Visit: Payer: Self-pay

## 2023-04-04 ENCOUNTER — Other Ambulatory Visit (HOSPITAL_COMMUNITY): Payer: Self-pay

## 2023-04-04 NOTE — Telephone Encounter (Signed)
Oral Oncology Patient Advocate Encounter  Prior Authorization for [name] has been approved.    PA# [id number] Effective dates: 04/03/23 through 04/02/24  Patients co-pay is $797.83.    Jinger Neighbors, CPhT-Adv Oncology Pharmacy Patient Advocate [facility] Direct Number: [phone]  Fax: [phone]

## 2023-04-05 ENCOUNTER — Inpatient Hospital Stay: Payer: PPO

## 2023-04-05 ENCOUNTER — Encounter: Payer: Self-pay | Admitting: Hematology

## 2023-04-06 ENCOUNTER — Ambulatory Visit: Payer: PPO | Admitting: Cardiology

## 2023-04-06 ENCOUNTER — Encounter: Payer: Self-pay | Admitting: Cardiology

## 2023-04-06 ENCOUNTER — Encounter: Payer: Self-pay | Admitting: Hematology

## 2023-04-06 VITALS — BP 115/78 | HR 94 | Resp 16 | Ht 64.0 in | Wt 216.0 lb

## 2023-04-06 DIAGNOSIS — I10 Essential (primary) hypertension: Secondary | ICD-10-CM | POA: Diagnosis not present

## 2023-04-06 DIAGNOSIS — G4730 Sleep apnea, unspecified: Secondary | ICD-10-CM | POA: Diagnosis not present

## 2023-04-06 DIAGNOSIS — I6523 Occlusion and stenosis of bilateral carotid arteries: Secondary | ICD-10-CM | POA: Diagnosis not present

## 2023-04-06 DIAGNOSIS — I5189 Other ill-defined heart diseases: Secondary | ICD-10-CM | POA: Diagnosis not present

## 2023-04-06 NOTE — Progress Notes (Signed)
ID:  Janice [name], DOB Apr 18, 1951, MRN [id number]  PCP:  Chilton Greathouse, MD  Cardiologist:  Tessa Lerner, DO, [facility] (established care 04/06/23) Former Cardiology Providers: Dr. Truett Mainland, [place], [place]  Date: 04/06/23 Last Office Visit: 02/23/2023  Chief Complaint  Patient presents with   Carotid disease   Follow-up    HPI  Janice [name] is a 72 y.o. Caucasian female whose past medical history and cardiovascular risk factors include: Hypertension, diabetes mellitus type 2, aortic regurgitation, carotid stenosis, sleep apnea in adult, liver cirrhosis, chronic lymphocytic lymphoma, anemia, postmenopausal female.  Patient is being followed by the practice for carotid disease.  I am seeing her for the first time today to reestablish care.  She was under the care of of [place], [place] and Dr. Truett Mainland in the past.  Her dyspnea on exertion has been chronic and stable.  She recently seen by her GI provider and there was concern for possible aortic stenosis and therefore echocardiogram was requested.  Since last office visit she did have an an echo which notes preserved LVEF, grade 1 diastolic dysfunction, and mild valvular heart disease.  No evidence for aortic stenosis.  Results of the carotid duplex reviewed with the patient.  Overall disease burden has improved.  Patient remains asymptomatic.  Since last office visit, she started Jardiance with her PCP and feels better.  Currently in the process of patient assistance to help with medication cost.  She also received IV iron and did well.  However after one of the iron infusion she noted a palpable veins in bilateral lower extremities which shortly resolved.  She continues to get IV IgG for her CLL and plans to be started on targeted therapy in August 2024.  FUNCTIONAL STATUS: Water exercise once a week. Stationary bike once a week at the [facility]  CARDIAC DATABASE: EKG: February 23, 2023: Sinus rhythm,  73 bpm, baseline artifact, poor R wave progression  Echocardiogram: 11/08/2021: Normal LV systolic function with visual EF 60-65%. Left ventricle cavity is normal in size. No obvious regional wall motion abnormalities. Normal left ventricular wall thickness. Normal global wall motion. Doppler evidence of grade II (pseudonormal) diastolic dysfunction, elevated LAP. Mild (Grade I) aortic regurgitation. Mild (Grade I) mitral regurgitation. Mild tricuspid regurgitation. Mild pulmonary hypertension. RVSP measures 41 mmHg. Compared to study 02/03/2021 no significant change.  03/28/2023: Left ventricle cavity is normal in size. Mild concentric hypertrophy of the left ventricle. Normal global wall motion. Normal LV systolic function with EF 65%. Doppler evidence of grade I (impaired) diastolic dysfunction, normal LAP.  Left atrial cavity is moderately dilated. Structurally normal trileaflet aortic valve. Mild (Grade I) aortic regurgitation. Mild mitral valve leaflet thickening with mild calcification. Trace mitral regurgitation. Mild tricuspid regurgitation.  No evidence of pulmonary hypertension.    Stress Testing: Lexiscan Tetrofosmin stress test 11/08/2021: Lexiscan nuclear stress test performed using 1-day protocol. Decreased tracer uptake in inferior myocardium, more prominent in rest images. In absence of any associated wall motion and thickening abnormality, this most likely represents breast tissue attenuation, with imaging performed in sitting position. Stress LVEF 72%. Low risk study.  Heart Catheterization: None  Carotid duplex: Carotid artery duplex 02/01/2022:  Duplex suggests stenosis in the right internal carotid artery (50-69%).  Duplex suggests stenosis in the right external carotid artery (<50%).  Duplex suggests stenosis in the left internal carotid artery (50-69%).  Duplex suggests stenosis in the left external carotid artery (<50%).  Compared to 02/03/2021, there is  progression of  stenosis severity in bilateral carotid arteries. Follow up in six months is appropriate if  clinically indicated.   Carotid artery duplex 03/28/2023:  Duplex suggests stenosis in the right internal carotid artery (minimal).   <50% stenosis in the right external carotid artery.  Duplex suggests stenosis in the left internal carotid artery (16-49%).  <50% stenosis in the left external carotid artery.  Antegrade right vertebral artery flow. Antegrade left vertebral artery flow.  Immediate comparison to study 02/01/2022 not performed due to significant velocity variation however compared to 02/03/2021, no significant change.  Follow up in one year is appropriate if clinically indicated.      ALLERGIES: Allergies  Allergen Reactions   Breo Ellipta [Fluticasone Furoate-Vilanterol] Anaphylaxis and Hives    Other reaction(s): hives  breo ellipta   Iodine Hives, Swelling and Other (See Comments)    And Betadine/caused hives and swelling   Latex Hives, Itching, Rash and Other (See Comments)    Burning   Tessalon [Benzonatate] Anaphylaxis, Hives and Other (See Comments)   Zocor [Simvastatin] Other (See Comments)    Caused neuropathy in arms Neuropathy in arms   Levaquin [Levofloxacin] Other (See Comments)    Joint aches//leg, shoulder pain Joint pain   Penicillins Hives   Povidone-Iodine Hives   Wellbutrin [Bupropion] Other (See Comments)    Negative thoughts. Crazy thoughts   Amoxicillin Hives    Blisters Did it involve swelling of the face/tongue/throat, SOB, or low BP? Yes Did it involve sudden or severe rash/hives, skin peeling, or any reaction on the inside of your mouth or nose? Yes Did you need to seek medical attention at a hospital or doctor's office?Unknown When did it last happen? 2019      If all above answers are "NO", may proceed with cephalosporin use.    Chlorhexidine Itching     Chlorhexidine Gluconate (CHG) Cloths/topical solution   Diflucan  [Fluconazole] Hives and Other (See Comments)    blister   Thalitone [Chlorthalidone] Other (See Comments)    Ears ringing    Amoxicillin-Pot Clavulanate Rash   Vibra-Tab [Doxycycline] Diarrhea and Itching    MEDICATION LIST PRIOR TO VISIT: Current Meds  Medication Sig   acyclovir (ZOVIRAX) 400 MG tablet Take 1 tablet (400 mg total) by mouth daily.   albuterol (VENTOLIN HFA) 108 (90 Base) MCG/ACT inhaler Inhale 1-2 puffs into the lungs every 6 (six) hours as needed (Cough).   amLODipine (NORVASC) 10 MG tablet Take 1 tablet (10 mg total) by mouth daily.   aspirin EC 81 MG tablet Take 1 tablet (81 mg total) by mouth daily. Swallow whole.   azelastine (ASTELIN) 0.1 % nasal spray Place 1 spray into both nostrils 2 (two) times daily. Use in each nostril as directed   b complex vitamins capsule Take 1 capsule by mouth in the morning.   bacitracin 500 UNIT/GM ointment Apply topically 2 (two) times daily for 10 days   budesonide-formoterol (SYMBICORT) 80-4.5 MCG/ACT inhaler Inhale 2 puffs into the lungs daily. (Patient taking differently: Inhale 2 puffs into the lungs 2 (two) times daily. Patient usually does 2 puffs in morning and 1 puff in evening.)   CALCIUM PO Take 1,000 mg by mouth every morning. 500 mg each   Carboxymethylcellul-Glycerin (REFRESH OPTIVE PF OP) Place 1 drop into both eyes 3 (three) times daily as needed (dry/irritated eyes.). Non-pres   cephALEXin (KEFLEX) 500 MG capsule Take 2 capsules (1,000 mg total) by mouth as directed 1 hour prior to dental work.   cetirizine (ZYRTEC)  10 MG tablet Take 5 mg by mouth in the morning.   Coenzyme Q10 (CO Q-10) 100 MG CAPS Take 100 mg by mouth in the morning.   Continuous Glucose Sensor (FREESTYLE LIBRE 2 SENSOR) MISC Use to monitor blood glucose continuously - change every 14 days.   desoximetasone (TOPICORT) 0.25 % cream Apply a small amount to skin twice a day as needed for flares   diclofenac sodium (VOLTAREN) 1 % GEL Apply 2 g topically 2  (two) times daily as needed (knee pain/ foot /Triger finger).   empagliflozin (JARDIANCE) 10 MG TABS tablet Take 10 mg by mouth in the morning.   empagliflozin (JARDIANCE) 10 MG TABS tablet Take 1 tablet (10 mg total) by mouth daily.   fluticasone (FLONASE) 50 MCG/ACT nasal spray Place 2 sprays into both nostrils in the morning. (Patient taking differently: Place 2 sprays into both nostrils daily as needed for allergies.)   furosemide (LASIX) 20 MG tablet Take 1 tablet (20 mg total) by mouth daily.   gabapentin (NEURONTIN) 300 MG capsule Take 1 capsule by mouth twice a day (Patient taking differently: Take 600 mg by mouth at bedtime.)   glucose blood test strip Use to test blood sugar 3 times daily as directed   hydrALAZINE (APRESOLINE) 25 MG tablet Take 1 tablet (25 mg total) by mouth 2 (two) times daily.   insulin aspart (NOVOLOG FLEXPEN) 100 UNIT/ML FlexPen inject 30 units under the skin with breakfast, 20 units with lunch, and 10 units with supper (Patient taking differently: Inject 30 Units into the skin with breakfast, with lunch, and with evening meal.)   insulin aspart (NOVOLOG FLEXPEN) 100 UNIT/ML FlexPen Inject 30 Units into the skin daily with breakfast AND 30 Units daily with lunch AND 30 Units daily with supper.   insulin degludec (TRESIBA FLEXTOUCH) 200 UNIT/ML FlexTouch Pen Inject 80 Units into the skin daily.   Insulin Pen Needle (BD PEN NEEDLE NANO 2ND GEN) 32G X 4 MM MISC Use as directed 6 times daily   irbesartan (AVAPRO) 300 MG tablet Take 1 tablet (300 mg total) by mouth in the morning.   Lancets (ONETOUCH DELICA PLUS LANCET33G) MISC Use to test blood sugar 3 times daily   LORazepam (ATIVAN) 0.5 MG tablet Take 1-2 tablets (0.5-1 mg total) by mouth once as needed for up to 1 dose (prior to MRI or CT scan for claustrophobia).   metFORMIN (GLUCOPHAGE-XR) 500 MG 24 hr tablet Take 2 tablets (1,000 mg total) by mouth 2 (two) times daily.   omeprazole (PRILOSEC) 40 MG capsule Take 1  capsule by mouth twice daily   ondansetron (ZOFRAN-ODT) 4 MG disintegrating tablet Take 1 tablet (4 mg total) by mouth every 8 (eight) hours as needed for nausea or vomiting.   prochlorperazine (COMPAZINE) 10 MG tablet Take 1 tablet (10 mg total) by mouth every 6 (six) hours as needed for nausea or vomiting.   rosuvastatin (CRESTOR) 10 MG tablet Take 1 tablet (10 mg total) by mouth daily. (Patient taking differently: Take 10 mg by mouth at bedtime.)   sertraline (ZOLOFT) 50 MG tablet Take 1 tablet (50 mg total) by mouth daily. (Patient taking differently: Take 50 mg by mouth every evening.)   spironolactone (ALDACTONE) 50 MG tablet Take 1 tablet (50 mg total) by mouth daily.   terconazole (TERAZOL 3) 0.8 % vaginal cream Place 1 applicator vaginally daily as needed (yeast infections).   venetoclax (VENCLEXTA) 10 & 50 & 100 MG Starter Pack Take by mouth daily. Take  20 mg for 7 days, then 50 mg daily x 7d, then 100 mg daily x 7d, then 200 mg daily x 7d. Take with food & water.     PAST MEDICAL HISTORY: Past Medical History:  Diagnosis Date   Allergy    Asthma    seasonal   Cellulitis 2013   Left toe   Chronic kidney disease    stage 1   Cirrhosis (HCC)    Common migraine    History of   Complication of anesthesia    Per pt/had breathing problems with "block" during rotator cuff surgery. 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   Coronary artery disease    DDD (degenerative disc disease)    Depression    denies takes paxil for migraines   Diabetes mellitus    type 2   Diabetic peripheral neuropathy (HCC)    Difficult airway for intubation 10/20/2022   DLx 1 with MAC 4, unable to see cords, DL with lopro S3 glidescope, easy atraumatic intubation   Difficult intubation 03/03/2023   Diverticulitis    Diverticulosis    DJD (degenerative joint disease)    Esophageal varices (HCC)    GERD (gastroesophageal reflux disease)    History of colon polyps    hyperplastic   History of gastric  polyp    Hyperlipidemia    Hypertension    Iron deficiency anemia    Obesity    OSA on CPAP    cpap   Peripheral neuropathy    Pneumonia    april 2020  mild   Primary localized osteoarthritis of left knee 03/27/2019   Sensorineural hearing loss    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HCC)     PAST SURGICAL HISTORY: Past Surgical History:  Procedure Laterality Date   ANKLE SURGERY     Left    BREAST BIOPSY Left 2021   left axilla, lymphoma   COLONOSCOPY  05/2018   ESOPHAGOGASTRODUODENOSCOPY (EGD) WITH PROPOFOL N/A 03/09/2023   Procedure: ESOPHAGOGASTRODUODENOSCOPY (EGD) WITH PROPOFOL;  Surgeon: Iva Boop, MD;  Location: [facility];  Service: Gastroenterology;  Laterality: N/A;   LEEP N/A 09/14/2018   Procedure: LOOP ELECTROSURGICAL EXCISION PROCEDURE (LEEP);  Surgeon: Richardean Chimera, MD;  Location: [facility];  Service: Gynecology;  Laterality: N/A;   MASS EXCISION Right 06/24/2021   Procedure: EXCISION RIGHT POSTERIOR NECK MASS;  Surgeon: Kinsinger, De Blanch, MD;  Location: [facility];  Service: General;  Laterality: Right;   ROTATOR CUFF REPAIR Bilateral 2012, 2015   TONSILLECTOMY     TOTAL KNEE ARTHROPLASTY Left 04/08/2019   Procedure: TOTAL KNEE ARTHROPLASTY;  Surgeon: Salvatore Marvel, MD;  Location: [facility];  Service: Orthopedics;  Laterality: Left;   UMBILICAL HERNIA REPAIR N/A 10/20/2022   Procedure: OPEN HERNIA REPAIR UMBILICAL ADULT with Mesh;  Surgeon: Kinsinger, De Blanch, MD;  Location: [facility];  Service: General;  Laterality: N/A;   UPPER GI ENDOSCOPY  05/2018    FAMILY HISTORY: The patient family history includes Bone cancer in her mother; Breast cancer in her mother; Diabetes Mellitus II in her brother, father, and sister; Heart failure in her father; Hypertension in her brother and father; Obesity in her sister; Other in her sister.  SOCIAL HISTORY:  The patient  reports that she has never smoked. She has been exposed to tobacco smoke. She has never used  smokeless tobacco. She reports that she does not currently use alcohol. She reports that she does not use drugs.  REVIEW OF SYSTEMS: Review of Systems  Constitutional: Positive for malaise/fatigue (better).  Cardiovascular:  Positive for dyspnea on exertion (improve). Negative for chest pain, claudication, irregular heartbeat, leg swelling, near-syncope, orthopnea, palpitations, paroxysmal nocturnal dyspnea and syncope.  Respiratory:  Negative for shortness of breath.   Hematologic/Lymphatic: Negative for bleeding problem.  Musculoskeletal:  Negative for muscle cramps and myalgias.  Neurological:  Negative for dizziness and light-headedness.    PHYSICAL EXAM:    04/06/2023    2:08 PM 03/29/2023    4:00 PM 03/29/2023    1:00 PM  Vitals with BMI  Height 5\' 4"     Weight 216 lbs  214 lbs 6 oz  BMI 37.06  36.78  Systolic 115 105 409  Diastolic 78 72 30  Pulse 94 71 73    Physical Exam  Constitutional: No distress.  Age appropriate, hemodynamically stable.   Neck: No JVD present.  Cardiovascular: Normal rate, regular rhythm, S1 normal, S2 normal, intact distal pulses and normal pulses. Exam reveals no gallop, no S3 and no S4.  Murmur heard. High-pitched decrescendo early diastolic murmur is present with a grade of 3/4 at the upper right sternal border radiating to the apex. Pulses:      Carotid pulses are  on the right side with bruit and  on the left side with bruit. Pulmonary/Chest: Effort normal and breath sounds normal. No stridor. She has no wheezes. She has no rales.  Abdominal: Soft. Bowel sounds are normal. She exhibits no distension. There is no abdominal tenderness.  Musculoskeletal:        General: No edema.     Cervical back: Neck supple.  Neurological: She is alert and oriented to person, place, and time. She has intact cranial nerves (2-12).  Skin: Skin is warm and moist.   LABORATORY DATA:    Latest Ref Rng & Units 02/22/2023   11:27 AM 12/02/2022   11:33 AM  10/20/2022   11:48 AM  CBC  WBC 4.0 - 10.5 K/uL 54.2  42.0  56.9   Hemoglobin 12.0 - 15.0 g/dL 9.1  81.1  91.4   Hematocrit 36.0 - 46.0 % 29.1  34.8  34.9   Platelets 150 - 400 K/uL 93  112  108        Latest Ref Rng & Units 02/22/2023   11:27 AM 12/19/2022   12:47 PM 12/12/2022    5:04 PM  CMP  Glucose 70 - 99 mg/dL 782  956  213   BUN 8 - 23 mg/dL 19  17  12    Creatinine 0.44 - 1.00 mg/dL 0.86  5.78  4.69   Sodium 135 - 145 mmol/L 138  138  132   Potassium 3.5 - 5.1 mmol/L 4.5  4.2  4.3   Chloride 98 - 111 mmol/L 105  105  103   CO2 22 - 32 mmol/L 26  25  24    Calcium 8.9 - 10.3 mg/dL 9.0  9.3  8.7   Total Protein 6.5 - 8.1 g/dL 6.6     Total Bilirubin 0.3 - 1.2 mg/dL 0.4     Alkaline Phos 38 - 126 U/L 64     AST 15 - 41 U/L 21     ALT 0 - 44 U/L 17       Lab Results  Component Value Date   CHOL 125 10/18/2021   HDL 43 10/18/2021   LDLCALC 65 10/18/2021   TRIG 86 10/18/2021   No components found for: "NTPROBNP" No results for input(s): "PROBNP" in the last  8760 hours. No results for input(s): "TSH" in the last 8760 hours.  BMP Recent Labs    10/20/22 1148 12/02/22 1133 12/12/22 1704 12/19/22 1247 02/22/23 1127  NA  --  138 132* 138 138  K  --  4.5 4.3 4.2 4.5  CL  --  107 103 105 105  CO2  --  26 24 25 26   GLUCOSE  --  232* 194* 116* 203*  BUN  --  12 12 17 19   CREATININE 0.81 0.79 0.90 0.92 0.84  CALCIUM  --  8.9 8.7 9.3 9.0  GFRNONAA >60 >60  --   --  >60    HEMOGLOBIN A1C Lab Results  Component Value Date   HGBA1C 7.5 (H) 10/03/2022   MPG 168.55 10/03/2022    IMPRESSION:    ICD-10-CM   1. Asymptomatic bilateral carotid artery stenosis  I65.23 PCV CAROTID DUPLEX (BILATERAL)    2. Diastolic dysfunction  I51.89     3. Essential hypertension  I10     4. Sleep apnea in adult  G47.30         RECOMMENDATIONS: Candis Kabel is a 72 y.o. Caucasian female whose past medical history and cardiac risk factors include: Hypertension, diabetes  mellitus type 2, aortic regurgitation, carotid stenosis, sleep apnea on CPAP, liver cirrhosis, chronic lymphocytic lymphoma, anemia, postmenopausal female.  Asymptomatic bilateral carotid artery stenosis Remains asymptomatic. May 2023 noted bilateral ICA disease <70%. Repeat carotid duplex July 2024 notes minimal disease in the right ICA and <50% stenosis in the left ICA. Continue aspirin and statin therapy. Reemphasized importance of glycemic and lipid management. Will repeat carotid duplex in 1 year to evaluate disease progression  Diastolic dysfunction Stage B, NYHA class II Dyspnea on exertion has improved since last office visit. Repeat echocardiogram notes preserved LVEF, grade 1 diastolic dysfunction, no significant valvular heart disease, see report for additional details. Medications reconciled. Recently started on Jardiance 10 mg p.o. daily-by PCP tolerating it well without any side effects. Reemphasized importance of improving her modifiable cardiovascular risk factors  Essential hypertension Office blood pressures are well-controlled. No medication changes warranted at this time  Sleep apnea in adult Reemphasized the importance of CPAP compliance.  When she last visited her gastroenterologist today with concern for aortic stenosis based on physical examination.  Repeat echocardiogram was requested and performed.  Results reviewed with her in great detail and noted above for further reference.  No obvious evidence of aortic stenosis but she does have a cardiac murmur on physical examination consistent with AR.  Continue to monitor for now.  She will be given additional therapy for CLL coming August 2024.  From a cardiovascular standpoint would like to see her back in 1 year or sooner if needed.  FINAL MEDICATION LIST END OF ENCOUNTER: No orders of the defined types were placed in this encounter.   Medications Discontinued During This Encounter  Medication Reason    ondansetron (ZOFRAN) 8 MG tablet    allopurinol (ZYLOPRIM) 300 MG tablet      Current Outpatient Medications:    acyclovir (ZOVIRAX) 400 MG tablet, Take 1 tablet (400 mg total) by mouth daily., Disp: 30 tablet, Rfl: 5   albuterol (VENTOLIN HFA) 108 (90 Base) MCG/ACT inhaler, Inhale 1-2 puffs into the lungs every 6 (six) hours as needed (Cough)., Disp: 18 g, Rfl: 0   amLODipine (NORVASC) 10 MG tablet, Take 1 tablet (10 mg total) by mouth daily., Disp: 90 tablet, Rfl: 3   aspirin EC 81 MG  tablet, Take 1 tablet (81 mg total) by mouth daily. Swallow whole., Disp: 90 tablet, Rfl: 3   azelastine (ASTELIN) 0.1 % nasal spray, Place 1 spray into both nostrils 2 (two) times daily. Use in each nostril as directed, Disp: 9 mL, Rfl: 11   b complex vitamins capsule, Take 1 capsule by mouth in the morning., Disp: , Rfl:    bacitracin 500 UNIT/GM ointment, Apply topically 2 (two) times daily for 10 days, Disp: 30 g, Rfl: 0   budesonide-formoterol (SYMBICORT) 80-4.5 MCG/ACT inhaler, Inhale 2 puffs into the lungs daily. (Patient taking differently: Inhale 2 puffs into the lungs 2 (two) times daily. Patient usually does 2 puffs in morning and 1 puff in evening.), Disp: 10.2 g, Rfl: 12   CALCIUM PO, Take 1,000 mg by mouth every morning. 500 mg each, Disp: , Rfl:    Carboxymethylcellul-Glycerin (REFRESH OPTIVE PF OP), Place 1 drop into both eyes 3 (three) times daily as needed (dry/irritated eyes.). Non-pres, Disp: , Rfl:    cephALEXin (KEFLEX) 500 MG capsule, Take 2 capsules (1,000 mg total) by mouth as directed 1 hour prior to dental work., Disp: 20 capsule, Rfl: 0   cetirizine (ZYRTEC) 10 MG tablet, Take 5 mg by mouth in the morning., Disp: , Rfl:    Coenzyme Q10 (CO Q-10) 100 MG CAPS, Take 100 mg by mouth in the morning., Disp: , Rfl:    Continuous Glucose Sensor (FREESTYLE LIBRE 2 SENSOR) MISC, Use to monitor blood glucose continuously - change every 14 days., Disp: 6 each, Rfl: 3   desoximetasone (TOPICORT) 0.25  % cream, Apply a small amount to skin twice a day as needed for flares, Disp: 60 g, Rfl: 0   diclofenac sodium (VOLTAREN) 1 % GEL, Apply 2 g topically 2 (two) times daily as needed (knee pain/ foot /Triger finger)., Disp: , Rfl: 2   empagliflozin (JARDIANCE) 10 MG TABS tablet, Take 10 mg by mouth in the morning., Disp: , Rfl:    empagliflozin (JARDIANCE) 10 MG TABS tablet, Take 1 tablet (10 mg total) by mouth daily., Disp: 90 tablet, Rfl: 3   fluticasone (FLONASE) 50 MCG/ACT nasal spray, Place 2 sprays into both nostrils in the morning. (Patient taking differently: Place 2 sprays into both nostrils daily as needed for allergies.), Disp: 48 g, Rfl: 2   furosemide (LASIX) 20 MG tablet, Take 1 tablet (20 mg total) by mouth daily., Disp: 90 tablet, Rfl: 3   gabapentin (NEURONTIN) 300 MG capsule, Take 1 capsule by mouth twice a day (Patient taking differently: Take 600 mg by mouth at bedtime.), Disp: 180 capsule, Rfl: 3   glucose blood test strip, Use to test blood sugar 3 times daily as directed, Disp: 300 each, Rfl: 3   hydrALAZINE (APRESOLINE) 25 MG tablet, Take 1 tablet (25 mg total) by mouth 2 (two) times daily., Disp: 180 tablet, Rfl: 2   insulin aspart (NOVOLOG FLEXPEN) 100 UNIT/ML FlexPen, inject 30 units under the skin with breakfast, 20 units with lunch, and 10 units with supper (Patient taking differently: Inject 30 Units into the skin with breakfast, with lunch, and with evening meal.), Disp: 30 mL, Rfl: 11   insulin aspart (NOVOLOG FLEXPEN) 100 UNIT/ML FlexPen, Inject 30 Units into the skin daily with breakfast AND 30 Units daily with lunch AND 30 Units daily with supper., Disp: 90 mL, Rfl: 3   insulin degludec (TRESIBA FLEXTOUCH) 200 UNIT/ML FlexTouch Pen, Inject 80 Units into the skin daily., Disp: 30 mL, Rfl: 11  Insulin Pen Needle (BD PEN NEEDLE NANO 2ND GEN) 32G X 4 MM MISC, Use as directed 6 times daily, Disp: 400 each, Rfl: 5   irbesartan (AVAPRO) 300 MG tablet, Take 1 tablet (300 mg  total) by mouth in the morning., Disp: 90 tablet, Rfl: 3   Lancets (ONETOUCH DELICA PLUS LANCET33G) MISC, Use to test blood sugar 3 times daily, Disp: 300 each, Rfl: 2   LORazepam (ATIVAN) 0.5 MG tablet, Take 1-2 tablets (0.5-1 mg total) by mouth once as needed for up to 1 dose (prior to MRI or CT scan for claustrophobia)., Disp: 4 tablet, Rfl: 0   metFORMIN (GLUCOPHAGE-XR) 500 MG 24 hr tablet, Take 2 tablets (1,000 mg total) by mouth 2 (two) times daily., Disp: 360 tablet, Rfl: 3   omeprazole (PRILOSEC) 40 MG capsule, Take 1 capsule by mouth twice daily, Disp: 180 capsule, Rfl: 3   ondansetron (ZOFRAN-ODT) 4 MG disintegrating tablet, Take 1 tablet (4 mg total) by mouth every 8 (eight) hours as needed for nausea or vomiting., Disp: 20 tablet, Rfl: 0   prochlorperazine (COMPAZINE) 10 MG tablet, Take 1 tablet (10 mg total) by mouth every 6 (six) hours as needed for nausea or vomiting., Disp: 30 tablet, Rfl: 1   rosuvastatin (CRESTOR) 10 MG tablet, Take 1 tablet (10 mg total) by mouth daily. (Patient taking differently: Take 10 mg by mouth at bedtime.), Disp: 90 tablet, Rfl: 2   sertraline (ZOLOFT) 50 MG tablet, Take 1 tablet (50 mg total) by mouth daily. (Patient taking differently: Take 50 mg by mouth every evening.), Disp: 90 tablet, Rfl: 3   spironolactone (ALDACTONE) 50 MG tablet, Take 1 tablet (50 mg total) by mouth daily., Disp: 90 tablet, Rfl: 3   terconazole (TERAZOL 3) 0.8 % vaginal cream, Place 1 applicator vaginally daily as needed (yeast infections)., Disp: , Rfl:    venetoclax (VENCLEXTA) 10 & 50 & 100 MG Starter Pack, Take by mouth daily. Take 20 mg for 7 days, then 50 mg daily x 7d, then 100 mg daily x 7d, then 200 mg daily x 7d. Take with food & water., Disp: 42 tablet, Rfl: 0  Orders Placed This Encounter  Procedures   PCV CAROTID DUPLEX (BILATERAL)    There are no Patient Instructions on file for this visit.   --Continue cardiac medications as reconciled in final medication  list. --Return in about 53 weeks (around 04/11/2024) for Follow up, Carotid disease. or sooner if needed. --Continue follow-up with your primary care physician regarding the management of your other chronic comorbid conditions.  Patient's questions and concerns were addressed to her satisfaction. She voices understanding of the instructions provided during this encounter.   This note was created using a voice recognition software as a result there may be grammatical errors inadvertently enclosed that do not reflect the nature of this encounter. Every attempt is made to correct such errors.  Tessa Lerner, [place], [facility]  Pager:  [phone] Office: [phone]

## 2023-04-07 ENCOUNTER — Other Ambulatory Visit (HOSPITAL_COMMUNITY): Payer: Self-pay

## 2023-04-07 ENCOUNTER — Other Ambulatory Visit: Payer: Self-pay

## 2023-04-07 ENCOUNTER — Other Ambulatory Visit: Payer: Self-pay | Admitting: *Deleted

## 2023-04-07 ENCOUNTER — Inpatient Hospital Stay: Payer: PPO

## 2023-04-07 VITALS — BP 117/48 | HR 70 | Temp 98.0°F | Resp 18

## 2023-04-07 DIAGNOSIS — D801 Nonfamilial hypogammaglobulinemia: Secondary | ICD-10-CM

## 2023-04-07 DIAGNOSIS — R110 Nausea: Secondary | ICD-10-CM

## 2023-04-07 DIAGNOSIS — C9110 Chronic lymphocytic leukemia of B-cell type not having achieved remission: Secondary | ICD-10-CM

## 2023-04-07 DIAGNOSIS — D500 Iron deficiency anemia secondary to blood loss (chronic): Secondary | ICD-10-CM

## 2023-04-07 DIAGNOSIS — C8300 Small cell B-cell lymphoma, unspecified site: Secondary | ICD-10-CM

## 2023-04-07 MED ORDER — ONDANSETRON 4 MG PO TBDP
4.0000 mg | ORAL_TABLET | Freq: Three times a day (TID) | ORAL | 0 refills | Status: DC | PRN
Start: 2023-04-07 — End: 2023-07-06
  Filled 2023-04-07: qty 20, 7d supply, fill #0

## 2023-04-07 MED ORDER — ACETAMINOPHEN 325 MG PO TABS
650.0000 mg | ORAL_TABLET | Freq: Once | ORAL | Status: AC
  Administered 2023-04-07: 650 mg via ORAL
  Filled 2023-04-07: qty 2

## 2023-04-07 MED ORDER — LORATADINE 10 MG PO TABS
10.0000 mg | ORAL_TABLET | Freq: Once | ORAL | Status: AC
  Administered 2023-04-07: 10 mg via ORAL
  Filled 2023-04-07: qty 1

## 2023-04-07 MED ORDER — SODIUM CHLORIDE 0.9 % IV SOLN
300.0000 mg | Freq: Once | INTRAVENOUS | Status: AC
  Administered 2023-04-07: 300 mg via INTRAVENOUS
  Filled 2023-04-07: qty 300

## 2023-04-07 MED ORDER — SODIUM CHLORIDE 0.9 % IV SOLN: Freq: Once | INTRAVENOUS | Status: AC

## 2023-04-07 NOTE — Progress Notes (Signed)
Pt declined to stay for 30 minute observation period post venofer infusion. VSS. No complaints at time of discharge.  

## 2023-04-07 NOTE — Patient Instructions (Signed)
Iron Sucrose Injection What is this medication? IRON SUCROSE (EYE ern SOO krose) treats low levels of iron (iron deficiency anemia) in people with kidney disease. Iron is a mineral that plays an important role in making red blood cells, which carry oxygen from your lungs to the rest of your body. This medicine may be used for other purposes; ask your health care provider or pharmacist if you have questions. COMMON BRAND NAME(S): Venofer What should I tell my care team before I take this medication? They need to know if you have any of these conditions: Anemia not caused by low iron levels Heart disease High levels of iron in the blood Kidney disease Liver disease An unusual or allergic reaction to iron, other medications, foods, dyes, or preservatives Pregnant or trying to get pregnant Breastfeeding How should I use this medication? This medication is for infusion into a vein. It is given in a hospital or clinic setting. Talk to your care team about the use of this medication in children. While this medication may be prescribed for children as young as 2 years for selected conditions, precautions do apply. Overdosage: If you think you have taken too much of this medicine contact a poison control center or emergency room at once. NOTE: This medicine is only for you. Do not share this medicine with others. What if I miss a dose? Keep appointments for follow-up doses. It is important not to miss your dose. Call your care team if you are unable to keep an appointment. What may interact with this medication? Do not take this medication with any of the following: Deferoxamine Dimercaprol Other iron products This medication may also interact with the following: Chloramphenicol Deferasirox This list may not describe all possible interactions. Give your health care provider a list of all the medicines, herbs, non-prescription drugs, or dietary supplements you use. Also tell them if you smoke,  drink alcohol, or use illegal drugs. Some items may interact with your medicine. What should I watch for while using this medication? Visit your care team regularly. Tell your care team if your symptoms do not start to get better or if they get worse. You may need blood work done while you are taking this medication. You may need to follow a special diet. Talk to your care team. Foods that contain iron include: whole grains/cereals, dried fruits, beans, or peas, leafy green vegetables, and organ meats (liver, kidney). What side effects may I notice from receiving this medication? Side effects that you should report to your care team as soon as possible: Allergic reactions--skin rash, itching, hives, swelling of the face, lips, tongue, or throat Low blood pressure--dizziness, feeling faint or lightheaded, blurry vision Shortness of breath Side effects that usually do not require medical attention (report to your care team if they continue or are bothersome): Flushing Headache Joint pain Muscle pain Nausea Pain, redness, or irritation at injection site This list may not describe all possible side effects. Call your doctor for medical advice about side effects. You may report side effects to FDA at 1-800-FDA-1088. Where should I keep my medication? This medication is given in a hospital or clinic. It will not be stored at home. NOTE: This sheet is a summary. It may not cover all possible information. If you have questions about this medicine, talk to your doctor, pharmacist, or health care provider.  2024 Elsevier/Gold Standard (2023-02-03 00:00:00)

## 2023-04-12 ENCOUNTER — Other Ambulatory Visit: Payer: Self-pay

## 2023-04-12 ENCOUNTER — Inpatient Hospital Stay: Payer: PPO

## 2023-04-12 VITALS — BP 127/48 | HR 65 | Temp 98.2°F | Resp 18

## 2023-04-12 DIAGNOSIS — D801 Nonfamilial hypogammaglobulinemia: Secondary | ICD-10-CM | POA: Diagnosis not present

## 2023-04-12 DIAGNOSIS — C8300 Small cell B-cell lymphoma, unspecified site: Secondary | ICD-10-CM

## 2023-04-12 MED ORDER — ALTEPLASE 2 MG IJ SOLR: 2.0000 mg | Freq: Once | INTRAMUSCULAR | Status: DC | PRN

## 2023-04-12 MED ORDER — SODIUM CHLORIDE 0.9% FLUSH: 3.0000 mL | Freq: Once | INTRAVENOUS | Status: DC | PRN

## 2023-04-12 MED ORDER — SODIUM CHLORIDE 0.9% FLUSH: 10.0000 mL | Freq: Once | INTRAVENOUS | Status: DC | PRN

## 2023-04-12 MED ORDER — ACETAMINOPHEN 325 MG PO TABS
650.0000 mg | ORAL_TABLET | Freq: Once | ORAL | Status: AC
  Administered 2023-04-12: 650 mg via ORAL
  Filled 2023-04-12: qty 2

## 2023-04-12 MED ORDER — SODIUM CHLORIDE 0.9 % IV SOLN
300.0000 mg | Freq: Once | INTRAVENOUS | Status: AC
  Administered 2023-04-12: 300 mg via INTRAVENOUS
  Filled 2023-04-12: qty 300

## 2023-04-12 MED ORDER — HEPARIN SOD (PORK) LOCK FLUSH 100 UNIT/ML IV SOLN: 250.0000 [IU] | Freq: Once | INTRAVENOUS | Status: DC | PRN

## 2023-04-12 MED ORDER — HEPARIN SOD (PORK) LOCK FLUSH 100 UNIT/ML IV SOLN: 500.0000 [IU] | Freq: Once | INTRAVENOUS | Status: DC | PRN

## 2023-04-12 MED ORDER — SODIUM CHLORIDE 0.9 % IV SOLN: Freq: Once | INTRAVENOUS | Status: AC

## 2023-04-12 MED ORDER — LORATADINE 10 MG PO TABS
10.0000 mg | ORAL_TABLET | Freq: Once | ORAL | Status: AC
  Administered 2023-04-12: 10 mg via ORAL
  Filled 2023-04-12: qty 1

## 2023-04-12 NOTE — Patient Instructions (Signed)
Iron Sucrose Injection What is this medication? IRON SUCROSE (EYE ern SOO krose) treats low levels of iron (iron deficiency anemia) in people with kidney disease. Iron is a mineral that plays an important role in making red blood cells, which carry oxygen from your lungs to the rest of your body. This medicine may be used for other purposes; ask your health care provider or pharmacist if you have questions. COMMON BRAND NAME(S): Venofer What should I tell my care team before I take this medication? They need to know if you have any of these conditions: Anemia not caused by low iron levels Heart disease High levels of iron in the blood Kidney disease Liver disease An unusual or allergic reaction to iron, other medications, foods, dyes, or preservatives Pregnant or trying to get pregnant Breastfeeding How should I use this medication? This medication is for infusion into a vein. It is given in a hospital or clinic setting. Talk to your care team about the use of this medication in children. While this medication may be prescribed for children as young as 2 years for selected conditions, precautions do apply. Overdosage: If you think you have taken too much of this medicine contact a poison control center or emergency room at once. NOTE: This medicine is only for you. Do not share this medicine with others. What if I miss a dose? Keep appointments for follow-up doses. It is important not to miss your dose. Call your care team if you are unable to keep an appointment. What may interact with this medication? Do not take this medication with any of the following: Deferoxamine Dimercaprol Other iron products This medication may also interact with the following: Chloramphenicol Deferasirox This list may not describe all possible interactions. Give your health care provider a list of all the medicines, herbs, non-prescription drugs, or dietary supplements you use. Also tell them if you smoke,  drink alcohol, or use illegal drugs. Some items may interact with your medicine. What should I watch for while using this medication? Visit your care team regularly. Tell your care team if your symptoms do not start to get better or if they get worse. You may need blood work done while you are taking this medication. You may need to follow a special diet. Talk to your care team. Foods that contain iron include: whole grains/cereals, dried fruits, beans, or peas, leafy green vegetables, and organ meats (liver, kidney). What side effects may I notice from receiving this medication? Side effects that you should report to your care team as soon as possible: Allergic reactions--skin rash, itching, hives, swelling of the face, lips, tongue, or throat Low blood pressure--dizziness, feeling faint or lightheaded, blurry vision Shortness of breath Side effects that usually do not require medical attention (report to your care team if they continue or are bothersome): Flushing Headache Joint pain Muscle pain Nausea Pain, redness, or irritation at injection site This list may not describe all possible side effects. Call your doctor for medical advice about side effects. You may report side effects to FDA at 1-800-FDA-1088. Where should I keep my medication? This medication is given in a hospital or clinic. It will not be stored at home. NOTE: This sheet is a summary. It may not cover all possible information. If you have questions about this medicine, talk to your doctor, pharmacist, or health care provider.  2024 Elsevier/Gold Standard (2023-02-03 00:00:00)

## 2023-04-12 NOTE — Progress Notes (Signed)
Pt declined to stay for 30 minute observation period post Venofer infusion. VSS. No complaints at time of discharge.  

## 2023-04-17 ENCOUNTER — Encounter: Payer: Self-pay | Admitting: Hematology

## 2023-04-19 ENCOUNTER — Inpatient Hospital Stay: Payer: PPO | Attending: Hematology

## 2023-04-19 VITALS — BP 135/43 | HR 68 | Temp 98.5°F | Resp 18

## 2023-04-19 DIAGNOSIS — Z79624 Long term (current) use of inhibitors of nucleotide synthesis: Secondary | ICD-10-CM | POA: Diagnosis not present

## 2023-04-19 DIAGNOSIS — Z5112 Encounter for antineoplastic immunotherapy: Secondary | ICD-10-CM | POA: Insufficient documentation

## 2023-04-19 DIAGNOSIS — C9110 Chronic lymphocytic leukemia of B-cell type not having achieved remission: Secondary | ICD-10-CM | POA: Insufficient documentation

## 2023-04-19 DIAGNOSIS — D696 Thrombocytopenia, unspecified: Secondary | ICD-10-CM | POA: Insufficient documentation

## 2023-04-19 DIAGNOSIS — D801 Nonfamilial hypogammaglobulinemia: Secondary | ICD-10-CM | POA: Insufficient documentation

## 2023-04-19 DIAGNOSIS — Z79899 Other long term (current) drug therapy: Secondary | ICD-10-CM | POA: Insufficient documentation

## 2023-04-19 DIAGNOSIS — Z7969 Long term (current) use of other immunomodulators and immunosuppressants: Secondary | ICD-10-CM | POA: Diagnosis not present

## 2023-04-19 DIAGNOSIS — Z7951 Long term (current) use of inhaled steroids: Secondary | ICD-10-CM | POA: Insufficient documentation

## 2023-04-19 DIAGNOSIS — Z7982 Long term (current) use of aspirin: Secondary | ICD-10-CM | POA: Insufficient documentation

## 2023-04-19 DIAGNOSIS — C8300 Small cell B-cell lymphoma, unspecified site: Secondary | ICD-10-CM

## 2023-04-19 MED ORDER — DIPHENHYDRAMINE HCL 25 MG PO CAPS
25.0000 mg | ORAL_CAPSULE | Freq: Once | ORAL | Status: AC
  Administered 2023-04-19: 25 mg via ORAL
  Filled 2023-04-19: qty 1

## 2023-04-19 MED ORDER — ACETAMINOPHEN 325 MG PO TABS
650.0000 mg | ORAL_TABLET | Freq: Once | ORAL | Status: AC
  Administered 2023-04-19: 650 mg via ORAL
  Filled 2023-04-19: qty 2

## 2023-04-19 MED ORDER — IMMUNE GLOBULIN (HUMAN) 10 GM/100ML IV SOLN
400.0000 mg/kg | INTRAVENOUS | Status: DC
  Administered 2023-04-19: 40 g via INTRAVENOUS
  Filled 2023-04-19: qty 400

## 2023-04-19 MED ORDER — DEXTROSE 5 % IV SOLN: INTRAVENOUS | Status: DC

## 2023-04-19 MED ORDER — METHYLPREDNISOLONE SODIUM SUCC 40 MG IJ SOLR
40.0000 mg | Freq: Every day | INTRAMUSCULAR | Status: DC
  Administered 2023-04-19: 40 mg via INTRAVENOUS
  Filled 2023-04-19: qty 1

## 2023-04-19 NOTE — Patient Instructions (Signed)

## 2023-04-20 ENCOUNTER — Other Ambulatory Visit: Payer: Self-pay

## 2023-04-24 ENCOUNTER — Other Ambulatory Visit: Payer: Self-pay

## 2023-04-24 ENCOUNTER — Other Ambulatory Visit: Payer: Self-pay | Admitting: Hematology

## 2023-04-24 ENCOUNTER — Inpatient Hospital Stay: Payer: PPO

## 2023-04-24 ENCOUNTER — Other Ambulatory Visit (HOSPITAL_COMMUNITY): Payer: Self-pay

## 2023-04-24 DIAGNOSIS — C9110 Chronic lymphocytic leukemia of B-cell type not having achieved remission: Secondary | ICD-10-CM

## 2023-04-24 MED ORDER — ALLOPURINOL 100 MG PO TABS
100.0000 mg | ORAL_TABLET | Freq: Two times a day (BID) | ORAL | 1 refills | Status: DC
  Filled 2023-04-24: qty 60, 30d supply, fill #0
  Filled 2023-05-18 – 2023-05-23 (×2): qty 60, 30d supply, fill #1

## 2023-04-25 ENCOUNTER — Encounter: Payer: Self-pay | Admitting: Hematology

## 2023-04-25 ENCOUNTER — Other Ambulatory Visit (HOSPITAL_COMMUNITY): Payer: Self-pay

## 2023-04-25 DIAGNOSIS — N182 Chronic kidney disease, stage 2 (mild): Secondary | ICD-10-CM | POA: Diagnosis not present

## 2023-04-25 DIAGNOSIS — E119 Type 2 diabetes mellitus without complications: Secondary | ICD-10-CM | POA: Diagnosis not present

## 2023-04-25 DIAGNOSIS — I129 Hypertensive chronic kidney disease with stage 1 through stage 4 chronic kidney disease, or unspecified chronic kidney disease: Secondary | ICD-10-CM | POA: Diagnosis not present

## 2023-04-25 NOTE — Progress Notes (Signed)
Received voicemail from patient after receiving my card per my request.  Called patient to introduce myself as Dance movement psychotherapist and to offer any available resources. Discussed one-time $1000 [employer] grant to assist with personal expenses while going through treatment. Advised what is needed to apply. She will provide on 8/15 at next appointment to be scanned and emailed to me. She will then be given grant paperwork to complete and advised her to contact me at earliest convenience after appointment to discuss grant expenses in detail.  Provided the number for [employer]([phone]) to assist with copay assistance if available for her specific treatment drugs or diagnosis. She was very Adult nurse.  She has my card to do so and for any additional financial questions or concerns.

## 2023-04-25 NOTE — Telephone Encounter (Signed)
Oral Chemotherapy Pharmacist Encounter   Spoke with patient today to follow up regarding patient's oral chemotherapy medication: Venclexta (venetoclax)  Discussed with patient that her earliest start date for Venclexta would be 05/18/23, thus we will wait until the week of 05/08/23 to set patient up for medication dispensing at the pharmacy and formal education on the Venclexta.  Patient voiced appreciation and understanding of the above. Patient knows to call the office with questions or concerns.  Lenord Carbo, PharmD, BCPS, [facility] Hematology/Oncology Clinical Pharmacist [facility] Oral Chemotherapy [facility] [phone] 04/25/2023 10:32 AM

## 2023-04-25 NOTE — Progress Notes (Signed)
Contacted pt regarding MyChart question. Discussed fluid intake and that GI would need to decide about pt continuing Lasix. Pt states she has no problems drinking water.

## 2023-04-26 MED FILL — Dexamethasone Sodium Phosphate Inj 100 MG/10ML: INTRAMUSCULAR | Qty: 2 | Status: AC

## 2023-04-27 ENCOUNTER — Other Ambulatory Visit: Payer: PPO

## 2023-04-27 ENCOUNTER — Ambulatory Visit: Payer: PPO

## 2023-04-27 ENCOUNTER — Other Ambulatory Visit: Payer: Self-pay

## 2023-04-27 ENCOUNTER — Ambulatory Visit (HOSPITAL_BASED_OUTPATIENT_CLINIC_OR_DEPARTMENT_OTHER): Payer: PPO | Admitting: Physician Assistant

## 2023-04-27 ENCOUNTER — Inpatient Hospital Stay: Payer: PPO

## 2023-04-27 ENCOUNTER — Inpatient Hospital Stay: Payer: PPO | Admitting: Hematology

## 2023-04-27 VITALS — BP 135/48 | HR 83 | Temp 98.1°F | Resp 18

## 2023-04-27 DIAGNOSIS — C9110 Chronic lymphocytic leukemia of B-cell type not having achieved remission: Secondary | ICD-10-CM

## 2023-04-27 DIAGNOSIS — T50905A Adverse effect of unspecified drugs, medicaments and biological substances, initial encounter: Secondary | ICD-10-CM

## 2023-04-27 DIAGNOSIS — Z5112 Encounter for antineoplastic immunotherapy: Secondary | ICD-10-CM | POA: Diagnosis not present

## 2023-04-27 LAB — CMP (CANCER CENTER ONLY)
ALT: 19 U/L (ref 0–44)
AST: 22 U/L (ref 15–41)
Albumin: 4.3 g/dL (ref 3.5–5.0)
Alkaline Phosphatase: 75 U/L (ref 38–126)
Anion gap: 8 (ref 5–15)
BUN: 22 mg/dL (ref 8–23)
CO2: 26 mmol/L (ref 22–32)
Calcium: 8.8 mg/dL — ABNORMAL LOW (ref 8.9–10.3)
Chloride: 104 mmol/L (ref 98–111)
Creatinine: 1.07 mg/dL — ABNORMAL HIGH (ref 0.44–1.00)
GFR, Estimated: 55 mL/min — ABNORMAL LOW (ref >60)
Glucose, Bld: 177 mg/dL — ABNORMAL HIGH (ref 70–99)
Potassium: 4.6 mmol/L (ref 3.5–5.1)
Sodium: 138 mmol/L (ref 135–145)
Total Bilirubin: 0.5 mg/dL (ref 0.3–1.2)
Total Protein: 7.1 g/dL (ref 6.5–8.1)

## 2023-04-27 LAB — CBC WITH DIFFERENTIAL (CANCER CENTER ONLY)
Abs Immature Granulocytes: 0.37 10*3/uL — ABNORMAL HIGH (ref 0.00–0.07)
Basophils Absolute: 0.1 10*3/uL (ref 0.0–0.1)
Basophils Relative: 0 %
Eosinophils Absolute: 0.2 10*3/uL (ref 0.0–0.5)
Eosinophils Relative: 0 %
HCT: 34.2 % — ABNORMAL LOW (ref 36.0–46.0)
Hemoglobin: 10.2 g/dL — ABNORMAL LOW (ref 12.0–15.0)
Immature Granulocytes: 1 %
Lymphocytes Relative: 89 %
Lymphs Abs: 64.9 10*3/uL — ABNORMAL HIGH (ref 0.7–4.0)
MCH: 27.9 pg (ref 26.0–34.0)
MCHC: 29.8 g/dL — ABNORMAL LOW (ref 30.0–36.0)
MCV: 93.7 fL (ref 80.0–100.0)
Monocytes Absolute: 2.2 10*3/uL — ABNORMAL HIGH (ref 0.1–1.0)
Monocytes Relative: 3 %
Neutro Abs: 5.3 10*3/uL (ref 1.7–7.7)
Neutrophils Relative %: 7 %
Platelet Count: 101 10*3/uL — ABNORMAL LOW (ref 150–400)
RBC: 3.65 MIL/uL — ABNORMAL LOW (ref 3.87–5.11)
RDW: 19.9 % — ABNORMAL HIGH (ref 11.5–15.5)
Smear Review: NORMAL
WBC Count: 73.1 10*3/uL (ref 4.0–10.5)
nRBC: 0.1 % (ref 0.0–0.2)

## 2023-04-27 LAB — LACTATE DEHYDROGENASE: LDH: 192 U/L (ref 98–192)

## 2023-04-27 LAB — URIC ACID: Uric Acid, Serum: 6.6 mg/dL (ref 2.5–7.1)

## 2023-04-27 MED ORDER — METHYLPREDNISOLONE SODIUM SUCC 125 MG IJ SOLR
125.0000 mg | Freq: Once | INTRAMUSCULAR | Status: AC | PRN
  Administered 2023-04-27: 125 mg via INTRAVENOUS

## 2023-04-27 MED ORDER — SODIUM CHLORIDE 0.9 % IV SOLN
20.0000 mg | Freq: Once | INTRAVENOUS | Status: AC
  Administered 2023-04-27: 20 mg via INTRAVENOUS
  Filled 2023-04-27: qty 20

## 2023-04-27 MED ORDER — DIPHENHYDRAMINE HCL 50 MG/ML IJ SOLN
25.0000 mg | Freq: Once | INTRAMUSCULAR | Status: AC
  Administered 2023-04-27 (×2): 25 mg via INTRAVENOUS

## 2023-04-27 MED ORDER — ONDANSETRON HCL 4 MG/2ML IJ SOLN: 4.0000 mg | Freq: Once | INTRAMUSCULAR | Status: DC

## 2023-04-27 MED ORDER — DIPHENHYDRAMINE HCL 50 MG/ML IJ SOLN
50.0000 mg | Freq: Once | INTRAMUSCULAR | Status: AC | PRN
  Administered 2023-04-27: 50 mg via INTRAVENOUS

## 2023-04-27 MED ORDER — SODIUM CHLORIDE 0.9 % IV SOLN: Freq: Once | INTRAVENOUS | Status: AC

## 2023-04-27 MED ORDER — FAMOTIDINE IN NACL 20-0.9 MG/50ML-% IV SOLN
20.0000 mg | Freq: Once | INTRAVENOUS | Status: AC | PRN
  Administered 2023-04-27: 20 mg via INTRAVENOUS

## 2023-04-27 MED ORDER — ONDANSETRON HCL 4 MG/2ML IJ SOLN
INTRAMUSCULAR | Status: AC
  Administered 2023-04-27: 4 mg
  Filled 2023-04-27: qty 2

## 2023-04-27 MED ORDER — SODIUM CHLORIDE 0.9 % IV SOLN: Freq: Once | INTRAVENOUS | Status: DC | PRN

## 2023-04-27 MED ORDER — ACETAMINOPHEN 325 MG PO TABS
650.0000 mg | ORAL_TABLET | Freq: Once | ORAL | Status: AC
  Administered 2023-04-27: 650 mg via ORAL
  Filled 2023-04-27: qty 2

## 2023-04-27 MED ORDER — FAMOTIDINE IN NACL 20-0.9 MG/50ML-% IV SOLN
20.0000 mg | Freq: Once | INTRAVENOUS | Status: AC
  Administered 2023-04-27: 20 mg via INTRAVENOUS
  Filled 2023-04-27: qty 50

## 2023-04-27 MED ORDER — CETIRIZINE HCL 10 MG/ML IV SOLN
10.0000 mg | Freq: Once | INTRAVENOUS | Status: AC
  Administered 2023-04-27: 10 mg via INTRAVENOUS
  Filled 2023-04-27: qty 1

## 2023-04-27 MED ORDER — ACETAMINOPHEN 325 MG PO TABS
650.0000 mg | ORAL_TABLET | Freq: Once | ORAL | Status: AC
  Administered 2023-04-27: 650 mg via ORAL

## 2023-04-27 MED ORDER — SODIUM CHLORIDE 0.9 % IV SOLN
100.0000 mg | Freq: Once | INTRAVENOUS | Status: AC
  Administered 2023-04-27: 100 mg via INTRAVENOUS
  Filled 2023-04-27: qty 4

## 2023-04-27 MED FILL — Dexamethasone Sodium Phosphate Inj 100 MG/10ML: INTRAMUSCULAR | Qty: 2 | Status: AC

## 2023-04-27 NOTE — Progress Notes (Signed)
DATE:  04/27/23                                        X CHEMO/IMMUNOTHERAPY REACTION             MD: Candise Che   AGENT/BLOOD PRODUCT RECEIVING TODAY:              Shelly Bombard   AGENT/BLOOD PRODUCT RECEIVING IMMEDIATELY PRIOR TO REACTION:          Gazyva   VS: BP:     111/72   P:       80       SPO2:       100 % RA                BP:     139/60   P:       79       SPO2:       100 % RA     REACTION(S):           back pain, nausea,    PREMEDS:     tylenol 650 mg PO, pepcid 20 mg IV, decadron 10 mg IV, cetirizine 10 mg IV   INTERVENTION: Pepcid 20 mg IV, benadryl 50 mg IV, solu-medrol 125 mg IV, zofran 4 mg IV, tylenol 650 mg Po   Review of Systems  Review of Systems  Constitutional:  Positive for diaphoresis.  HENT:  Positive for sore throat.   Respiratory:  Positive for shortness of breath.   Gastrointestinal:  Positive for nausea.  Musculoskeletal:  Positive for arthralgias, back pain and neck pain.  Neurological:  Positive for headaches.  All other systems reviewed and are negative.    Physical Exam  Physical Exam Vitals and nursing note reviewed.  Constitutional:      General: She is in acute distress.     Appearance: She is diaphoretic. She is not toxic-appearing.  HENT:     Head: Normocephalic.     Mouth/Throat:     Mouth: No angioedema.     Pharynx: No pharyngeal swelling.  Eyes:     Conjunctiva/sclera: Conjunctivae normal.  Cardiovascular:     Rate and Rhythm: Normal rate and regular rhythm.     Pulses: Normal pulses.     Heart sounds: Normal heart sounds.  Pulmonary:     Effort: Pulmonary effort is normal.     Breath sounds: Normal breath sounds.  Abdominal:     General: There is no distension.  Musculoskeletal:        General: Normal range of motion.     Cervical back: Normal range of motion. No tenderness.  Skin:    General: Skin is warm.  Neurological:     Mental Status: She is alert.     OUTCOME:     Patient became symptomatic after receiving  approximately 35 ml of Gazyva. Emergency medications were administered as documented above. Patient returned to baseline. Discussed with Dr. Candise Che who agrees wit plan to re challenge. Unfortunately 1 hour into re challenge patient again became symptomatic. Reaction severe with drop in systolic pressure. Additional benadryl and tylenol given. Patient closely monitored and symptoms resolved and BP normotensive. Dr. Candise Che evaluated patient at chairside and treatment will be discontinued today and removed from treatment plan. Patient stable for discharge home with sister.

## 2023-04-27 NOTE — Progress Notes (Signed)
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  INTERVAL HISTORY:  Janice [name] is a 72 y.o. 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 She is here to start cycle 1 of her treatment.  Patient was last seen by me on 03/24/2023 and she complained of fluctuating left-sided neck pain, swallowing problems, intermittent bone pain, and occasional chills.   Patient is accompanied by her sister during this visit. Patient notes she has been doing well overall since our last visit. She notes that her energy improved after receiving IV Iron. She complains of bilateral shin pain, congestion, and mild bilateral leg swelling.   Patient notes she had upper respiratory infection around 2 months ago and notes that she is still congested.   She denies any new infection issues, fever, chills, night sweats, unexpected weight loss, abnormal bowel movement, abdominal pain, chest pain, back pain. She does complain of enlarged lymph nodes near her left neck.   Patient notes she tolerated her IV Iron well. However, she reports of mild influenza-like symptoms without fever first night after receiving IV Iron.    MEDICAL HISTORY:  Past Medical History:  Diagnosis Date   Allergy    Asthma    seasonal   Cellulitis 2013   Left toe   Chronic kidney disease    stage 1   Cirrhosis (HCC)    Common migraine    History of   Complication of anesthesia    Per pt/had breathing problems with "block" during rotator cuff surgery. 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   Coronary artery disease     DDD (degenerative disc disease)    Depression    denies takes paxil for migraines   Diabetes mellitus    type 2   Diabetic peripheral neuropathy (HCC)    Difficult airway for intubation 10/20/2022   DLx 1 with MAC 4, unable to see cords, DL with lopro S3 glidescope, easy atraumatic intubation   Difficult intubation 03/03/2023   Diverticulitis    Diverticulosis    DJD (degenerative joint disease)    Esophageal varices (HCC)    GERD (gastroesophageal reflux disease)    History of colon polyps    hyperplastic   History of gastric polyp    Hyperlipidemia    Hypertension    Iron deficiency anemia    Obesity    OSA on CPAP    cpap   Peripheral neuropathy    Pneumonia    april 2020  mild   Primary localized osteoarthritis of left knee 03/27/2019   Sensorineural hearing loss    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HCC)     SURGICAL HISTORY: Past Surgical History:  Procedure Laterality Date   ANKLE SURGERY     Left    BREAST BIOPSY Left 2021   left axilla, lymphoma   COLONOSCOPY  05/2018   ESOPHAGOGASTRODUODENOSCOPY (EGD) WITH PROPOFOL N/A 03/09/2023   Procedure: ESOPHAGOGASTRODUODENOSCOPY (EGD) WITH PROPOFOL;  Surgeon: Iva Boop, MD;  Location: [facility];  Service: Gastroenterology;  Laterality: N/A;   LEEP N/A 09/14/2018   Procedure: LOOP ELECTROSURGICAL EXCISION PROCEDURE (LEEP);  Surgeon: Richardean Chimera, MD;  Location: [facility];  Service: Gynecology;  Laterality: N/A;   MASS EXCISION Right 06/24/2021   Procedure: EXCISION RIGHT POSTERIOR NECK MASS;  Surgeon: Kinsinger, De Blanch, MD;  Location: [facility];  Service: General;  Laterality: Right;   ROTATOR CUFF REPAIR Bilateral 2012, 2015   TONSILLECTOMY     TOTAL KNEE ARTHROPLASTY Left 04/08/2019   Procedure: TOTAL KNEE ARTHROPLASTY;  Surgeon: Salvatore Marvel, MD;  Location: [facility];  Service: Orthopedics;  Laterality: Left;   UMBILICAL HERNIA REPAIR N/A 10/20/2022   Procedure: OPEN HERNIA REPAIR UMBILICAL ADULT  with Mesh;  Surgeon: Kinsinger, De Blanch, MD;  Location: [facility];  Service: General;  Laterality: N/A;   UPPER GI ENDOSCOPY  05/2018    SOCIAL HISTORY: Social History   Socioeconomic History   Marital status: Widowed    Spouse name: Not on file   Number of children: 0   Years of education: 3 years college   Highest education level: Not on file  Occupational History   Occupation: Unemployed  Tobacco Use   Smoking status: Never    Passive exposure: Past   Smokeless tobacco: Never  Vaping Use   Vaping status: Never Used  Substance and Sexual Activity   Alcohol use: Not Currently    Comment: very rare   Drug use: No   Sexual activity: Not Currently    Birth control/protection: None, Post-menopausal  Other Topics Concern   Not on file  Social History Narrative   1 caffeine drink daily    Right-handed   widow (husband died from [place] cirrhosis)   Social Determinants of Health   Financial Resource Strain: Medium Risk (08/20/2020)   Overall Financial Resource Strain (CARDIA)    Difficulty of Paying Living Expenses: Somewhat hard  Food Insecurity: No Food Insecurity (10/20/2022)   Hunger Vital Sign    Worried About Running Out of Food in the Last Year: Never true    Ran Out of Food in the Last Year: Never true  Transportation Needs: No Transportation Needs (10/20/2022)   PRAPARE - Administrator, Civil Service (Medical): No    Lack of Transportation (Non-Medical): No  Physical Activity: Not on file  Stress: Not on file  Social Connections: Unknown (01/25/2022)   Received [facility]   Social Network    Social Network: Not on file  Intimate Partner Violence: Not At Risk (10/20/2022)   Humiliation, Afraid, Rape, and Kick questionnaire    Fear of Current or Ex-Partner: No    Emotionally Abused: No    Physically Abused: No    Sexually Abused: No    FAMILY HISTORY: Family History  Problem Relation Age of Onset   Breast cancer Mother    Bone  cancer Mother    Heart failure Father    Diabetes Mellitus II Father    Hypertension Father    Diabetes Mellitus II Sister    Obesity Sister    Other Sister        retina problem   Hypertension Brother    Diabetes Mellitus II Brother    Colon cancer Neg Hx    Rectal cancer Neg Hx    Stomach cancer Neg Hx    Esophageal cancer Neg Hx    Pancreatic cancer Neg Hx    Liver disease Neg Hx     ALLERGIES:  is allergic to breo ellipta [fluticasone furoate-vilanterol], iodine, latex, tessalon [benzonatate], zocor [simvastatin], levaquin [levofloxacin], penicillins, povidone-iodine, wellbutrin [bupropion], amoxicillin, chlorhexidine, diflucan [fluconazole], thalitone [chlorthalidone], amoxicillin-pot clavulanate, and vibra-tab [doxycycline].  MEDICATIONS:  Current Outpatient Medications  Medication Sig Dispense Refill   acyclovir (ZOVIRAX) 400 MG tablet Take 1 tablet (400 mg) by mouth daily. 30 tablet 5   albuterol (VENTOLIN HFA) 108 (90 Base) MCG/ACT inhaler Inhale 1-2 puffs into the lungs every 6 (six) hours as needed (Cough). 18 g 0   allopurinol (ZYLOPRIM) 100 MG tablet Take 1 tablet (100 mg) by mouth 2 times daily. 60 tablet 1   amLODipine (NORVASC) 10 MG tablet Take 1 tablet (10 mg total) by mouth daily. 90 tablet 3   aspirin EC 81 MG tablet Take 1 tablet (81 mg total) by mouth daily. Swallow whole. 90 tablet 3   azelastine (ASTELIN) 0.1 % nasal spray Place 1 spray into both nostrils 2 (two) times daily. Use in each nostril as directed 9 mL 11   b complex vitamins capsule Take 1 capsule by mouth in the morning.     bacitracin 500 UNIT/GM ointment Apply topically 2 (two) times daily for 10 days 30 g 0   budesonide-formoterol (SYMBICORT) 80-4.5 MCG/ACT inhaler Inhale 2 puffs into the lungs daily. (Patient taking differently: Inhale 2 puffs into the lungs 2 (two) times daily. Patient usually does 2 puffs in morning and 1 puff in evening.) 10.2 g 12   CALCIUM PO Take 1,000 mg by mouth every  morning. 500 mg each     Carboxymethylcellul-Glycerin (REFRESH OPTIVE PF OP) Place 1 drop into both eyes 3 (three) times daily as needed (dry/irritated eyes.). Non-pres     cephALEXin (KEFLEX) 500 MG capsule Take 2 capsules (1,000 mg total) by mouth as directed 1 hour prior to dental work. 20 capsule 0   cetirizine (ZYRTEC) 10 MG tablet Take 5 mg by mouth in the morning.     Coenzyme Q10 (CO Q-10) 100 MG CAPS Take 100 mg by mouth in the morning.     Continuous Glucose Sensor (FREESTYLE LIBRE 2 SENSOR) MISC Use to monitor blood glucose continuously - change every 14 days. 6 each 3   desoximetasone (TOPICORT) 0.25 % cream Apply a small amount to skin twice a day as needed for flares 60 g 0   diclofenac sodium (VOLTAREN) 1 % GEL Apply 2 g topically 2 (two) times daily as needed (knee pain/ foot /Triger finger).  2   empagliflozin (JARDIANCE) 10 MG TABS tablet Take 10 mg by mouth in the morning.     empagliflozin (JARDIANCE) 10 MG TABS tablet Take 1 tablet (10 mg total) by mouth daily. 90 tablet 3   fluticasone (FLONASE) 50 MCG/ACT nasal spray Place 2 sprays into both nostrils in the morning. (Patient taking differently: Place 2 sprays into both nostrils daily as needed for allergies.) 48 g 2   furosemide (LASIX) 20 MG tablet Take 1 tablet (20 mg total) by mouth daily. 90 tablet 3   gabapentin (NEURONTIN) 300 MG capsule Take 1 capsule by mouth twice a day (Patient taking differently: Take 600 mg by mouth at bedtime.) 180 capsule 3   glucose blood test strip Use to test blood sugar 3 times daily as directed 300 each 3   hydrALAZINE (APRESOLINE) 25 MG tablet Take 1 tablet (25 mg total) by mouth 2 (two) times daily. 180 tablet 2   insulin aspart (NOVOLOG FLEXPEN) 100 UNIT/ML FlexPen inject 30 units under the skin with breakfast, 20 units with lunch, and 10 units with supper (Patient taking differently: Inject 30 Units into the skin with breakfast, with lunch, and with evening meal.) 30 mL 11   insulin  aspart (NOVOLOG FLEXPEN) 100 UNIT/ML FlexPen Inject 30 Units into the skin daily with breakfast AND 30 Units daily with lunch AND 30 Units daily with supper. 90 mL 3   insulin degludec (TRESIBA FLEXTOUCH) 200 UNIT/ML FlexTouch Pen Inject 80 Units into the skin daily. 30 mL 11   Insulin Pen Needle (BD PEN NEEDLE NANO 2ND GEN) 32G X 4 MM MISC Use as directed 6 times daily 400 each 5   irbesartan (AVAPRO) 300 MG tablet Take 1 tablet (300 mg total) by mouth in the morning. 90 tablet 3   Lancets (ONETOUCH DELICA PLUS LANCET33G) MISC Use to test blood sugar 3 times daily 300 each 2   LORazepam (ATIVAN) 0.5 MG tablet Take 1-2 tablets (0.5-1 mg total) by mouth once as needed for up to 1 dose (prior to MRI or CT scan for claustrophobia). 4 tablet 0   metFORMIN (GLUCOPHAGE-XR) 500 MG 24 hr tablet Take 2 tablets (1,000 mg total) by mouth 2 (two) times daily. 360 tablet 3   omeprazole (PRILOSEC) 40 MG capsule Take 1 capsule by mouth twice daily 180 capsule 3   ondansetron (ZOFRAN-ODT) 4 MG disintegrating tablet Dissolve 1 tablet (4 mg) by mouth every 8 hours as needed for nausea or vomiting. 20 tablet 0   prochlorperazine (COMPAZINE) 10 MG tablet Take 1 tablet (10 mg total) by mouth every 6 (six) hours as needed for nausea or vomiting. 30 tablet 1   rosuvastatin (CRESTOR) 10 MG tablet Take 1 tablet (10 mg total) by mouth daily. (Patient taking differently: Take 10 mg by mouth at bedtime.) 90 tablet 2   sertraline (ZOLOFT) 50 MG tablet Take 1 tablet (50 mg total) by mouth daily. (Patient taking differently: Take 50 mg by mouth every evening.) 90 tablet 3   spironolactone (ALDACTONE) 50 MG tablet Take 1 tablet (50 mg total) by mouth daily. 90 tablet 3   terconazole (TERAZOL 3) 0.8 % vaginal cream Place 1 applicator vaginally daily as needed (yeast infections).     venetoclax (VENCLEXTA) 10 & 50 & 100 MG Starter Pack Take by mouth daily. Take 20 mg for 7 days, then 50 mg daily x 7d, then 100 mg daily x 7d, then 200  mg daily x 7d. Take with food & water. 42 tablet 0   No current facility-administered medications for this visit.    REVIEW OF SYSTEMS:    10 Point review of Systems was done is negative except as noted above.   PHYSICAL EXAMINATION: .BP (!) 117/48   Pulse 86   Temp 98.7 F (37.1 C)   Resp 18   Wt 216 lb 1.6 oz (98 kg)   SpO2 97%   BMI 37.09 kg/m  GENlERAL:alert, in no acute distress and comfortable SKIN: no acute rashes, no significant lesions EYES: conjunctiva are pink and non-injected, sclera anicteric OROPHARYNX: MMM, no exudates, no oropharyngeal erythema or ulceration NECK: supple, no JVD LYMPH:  no palpable lymphadenopathy in the cervical, axillary or inguinal regions LUNGS: clear to auscultation b/l with normal respiratory effort HEART: regular rate & rhythm ABDOMEN:  normoactive bowel sounds , non tender, not distended. Extremity: no pedal edema PSYCH: alert & oriented x 3 with fluent speech NEURO: no focal motor/sensory deficits   LABORATORY DATA:  I have reviewed the data as listed  .    Latest Ref Rng & Units 04/27/2023    8:13 AM 02/22/2023   11:27 AM 12/02/2022   11:33 AM  CBC  WBC 4.0 - 10.5 K/uL 73.1  54.2  42.0   Hemoglobin 12.0 - 15.0 g/dL 84.1  9.1  32.4   Hematocrit 36.0 - 46.0 % 34.2  29.1  34.8   Platelets 150 - 400 K/uL 101  93  112    . CBC    Component Value Date/Time   WBC 73.1 (HH) 04/27/2023 0813   WBC 56.9 (HH) 10/20/2022 1148   RBC 3.65 (L) 04/27/2023 0813   HGB 10.2 (L) 04/27/2023 0813   HGB 10.3 (L) 10/18/2021 1222   HCT 34.2 (L) 04/27/2023 0813   HCT 33.0 (L) 10/18/2021 1222   PLT 101 (L) 04/27/2023 0813   PLT 100 (LL) 10/18/2021 1222   MCV 93.7 04/27/2023 0813   MCV 84 10/18/2021 1222   MCH 27.9 04/27/2023 0813   MCHC 29.8 (L) 04/27/2023 0813   RDW 19.9 (H) 04/27/2023 0813   RDW 15.1 10/18/2021 1222   LYMPHSABS PENDING 04/27/2023 0813   MONOABS PENDING 04/27/2023 0813   EOSABS PENDING 04/27/2023 0813   BASOSABS  PENDING 04/27/2023 0813     .    Latest Ref Rng & Units 04/27/2023    8:13 AM 02/22/2023   11:27 AM 12/19/2022   12:47 PM  CMP  Glucose 70 - 99 mg/dL 401  027  253   BUN 8 - 23 mg/dL 22  19  17    Creatinine 0.44 - 1.00 mg/dL 6.64  4.03  4.74   Sodium 135 - 145 mmol/L 138  138  138   Potassium 3.5 - 5.1 mmol/L 4.6  4.5  4.2   Chloride 98 - 111 mmol/L 104  105  105   CO2 22 - 32 mmol/L 26  26  25    Calcium 8.9 - 10.3 mg/dL 8.8  9.0  9.3   Total Protein 6.5 - 8.1 g/dL 7.1  6.6    Total Bilirubin 0.3 - 1.2 mg/dL 0.5  0.4    Alkaline Phos 38 - 126 U/L 75  64    AST 15 - 41 U/L 22  21    ALT 0 - 44 U/L 19  17     . Lab Results  Component Value Date   LDH 194 (H) 02/22/2023   . Lab Results  Component Value Date   IRON 29 06/03/2022   TIBC 529 (H) 06/03/2022   IRONPCTSAT 6 (L) 06/03/2022   (Iron and TIBC)  Lab Results  Component Value Date   FERRITIN 10 (L) 02/22/2023    04/08/2020 FISH/CLL Panel:   03/30/2020 Left Axilla Flow Pathology Report [id number]   03/30/2020 Left axilla lymph node Bx ([id number]):   RADIOGRAPHIC STUDIES: I have personally reviewed the radiological images as listed and agreed with the findings in the report. PCV CAROTID DUPLEX (BILATERAL)  Result Date: 04/02/2023 Carotid artery duplex 03/28/2023: Duplex suggests stenosis in the right internal carotid artery (minimal).  <50% stenosis in the right external carotid artery. Duplex suggests stenosis in the left internal carotid artery (16-49%). <50% stenosis in the left external carotid artery. Antegrade right vertebral artery flow. Antegrade left vertebral artery flow. Immediate comparison to study 02/01/2022 not performed due to significant velocity variation however compared to 02/03/2021, no significant change. Follow up in one year is appropriate if clinically indicated.   PCV ECHOCARDIOGRAM COMPLETE  Result Date: 03/30/2023 Echocardiogram 03/28/2023: Left ventricle cavity is normal in  size. Mild concentric hypertrophy of the left ventricle. Normal global wall motion. Normal LV systolic function with EF 65%. Doppler evidence of grade I (impaired) diastolic dysfunction, normal LAP.  Left atrial cavity is moderately dilated. Structurally normal trileaflet aortic valve. Mild (Grade I) aortic regurgitation. Mild mitral valve leaflet thickening with mild calcification. Trace mitral regurgitation. Mild tricuspid regurgitation. No evidence of pulmonary hypertension.    MRI abdomen with and without contrast 07/08/2021 showed IMPRESSION: 1. Moderate motion and patient body habitus degraded exam. 2. Extensive abdominal adenopathy, similar to slightly increased compared to 05/01/2020 PET. 3. Cirrhosis and hepatosplenomegaly with suspicion of periesophageal varices. 4. Nonspecific hypoenhancing or nonenhancing splenic lesions. These could simply represent Hessie Knows bodies in the setting of cirrhosis. Lymphomatous involvement cannot be excluded. 5. Heterogeneous enhancement throughout the liver, without well-defined suspicious mass. Possibly due to altered perfusion in the setting of cirrhosis and porta hepatis adenopathy. Lymphomatous involvement cannot be excluded. Consider pre and post-contrast MRI follow-up at 2-3 months. A more aggressive approach would include random liver biopsy.  MRI abdomen with and without contrast 02/04/2022 showed IMPRESSION: 1. Examination is generally limited by breath motion artifact, particularly multiphasic contrast enhanced sequences. Within this limitation, cirrhotic morphology of the liver without focal liver lesion. 2. 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 3. Trace ascites. 4. Numerous enlarged gastrohepatic ligament, portacaval, retroperitoneal, and small bowel mesenteric lymph nodes are unchanged, in keeping with history of  lymphoma.  ASSESSMENT & PLAN:   72 yo female with   1) CLL/SLL -02/28/2020 MM Breast ([id number]) revealed "Bilateral axillary adenopathy." -03/30/2020 Left axilla lymph node biopsy ([id number]) revealed "CHRONIC LYMPHOCYTIC LYMPHOMA/SMALL LYMPHOCYTIC LYMPHOMA".  -03/30/2020 Left Axilla Flow Pathology Report [id number]) revealed "Monoclonal B-cell population with coexpression of CD5 comprises 80% of all lymphocytes". -04/20/20 PET/CT revealed 1. Adenopathy within the neck, chest, abdomen, and pelvis, consistent with active lymphoma. Much of this is not significantly hypermetabolic. Some upper abdominal nodes are moderately hypermetabolic.  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 NO overt GI bleeding noted. . Lab Results  Component Value Date   IRON 29 06/03/2022   TIBC 529 (H) 06/03/2022   IRONPCTSAT 6 (L) 06/03/2022   (Iron and TIBC)  Lab Results  Component Value Date   FERRITIN 10 (L) 02/22/2023    PLAN: -Discussed lab results from today, 04/27/2023, with the patient. CBC shows elevated WBC at 73.1 K, decreased hemoglobin at 10.2 g/dL, decreased hematocrit at 34.2%, and decreased platelet count at 101 K. CMP shows elevated glucose level at 177 and slightly elevated creatinine at 1.07.  -Discussed with the patient that her shin pain could be due to CLL but should follow-up with PCP regarding the pain. -Patient can proceed with cycle 1 of her treatment without dose modifications.  -Answered all of patient's questions regarding her treatment and other general  questions regarding CLL.  -Advised patient to get all the necessary vaccines such as COVID-19 vaccine, influenza vaccine, and RSV vaccine.   -Continue IVIG every 4 weeks x 6.   FOLLOW-UP: Please add MD visit with cycle  1 day 8 of treatment on 05/05/2023 Please schedule remaining cycle 1 and cycle 2 of Gazyva treatments as per integrated scheduling Continue IVIG every 4 weeks x 6  The total time spent in the appointment was 32 minutes* .  All of the patient's questions were answered with apparent satisfaction. The patient knows to call the clinic with any problems, questions or concerns.   Wyvonnia Lora MD MS AAHIVMS [facility] Hematology/Oncology Physician [facility]  .*Total Encounter Time as defined by the Centers for Medicare and Medicaid Services includes, in addition to the face-to-face time of a patient visit (documented in the note above) non-face-to-face time: obtaining and reviewing outside history, ordering and reviewing medications, tests or procedures, care coordination (communications with other health care professionals or caregivers) and documentation in the medical record.   I,Param Shah,acting as a Neurosurgeon for Wyvonnia Lora, MD.,have documented all relevant documentation on the behalf of Wyvonnia Lora, MD,as directed by  Wyvonnia Lora, MD while in the presence of Wyvonnia Lora, MD.  ADDENDUM  Patient reacted significant to Gazyva x 2 including back pain/throat tightness, drop in BP and shortness of breath. Gazyva discontinued and all Gazyva appointments to be cancelled

## 2023-04-27 NOTE — Patient Instructions (Signed)
[place] [facility]  Discharge Instructions: Thank you for choosing [facility] to provide your oncology and hematology care.   If you have a lab appointment with the Cancer Center, please go directly to the Cancer Center and check in at the registration area.   Wear comfortable clothing and clothing appropriate for easy access to any Portacath or PICC line.   We strive to give you quality time with your provider. You may need to reschedule your appointment if you arrive late (15 or more minutes).  Arriving late affects you and other patients whose appointments are after yours.  Also, if you miss three or more appointments without notifying the office, you may be dismissed from the clinic at the provider's discretion.      For prescription refill requests, have your pharmacy contact our office and allow 72 hours for refills to be completed.    Today you received the following chemotherapy and/or immunotherapy agents: Gazyva(Obinutuzumab)     To help prevent nausea and vomiting after your treatment, we encourage you to take your nausea medication as directed.  BELOW ARE SYMPTOMS THAT SHOULD BE REPORTED IMMEDIATELY: *FEVER GREATER THAN 100.4 F (38 C) OR HIGHER *CHILLS OR SWEATING *NAUSEA AND VOMITING THAT IS NOT CONTROLLED WITH YOUR NAUSEA MEDICATION *UNUSUAL SHORTNESS OF BREATH *UNUSUAL BRUISING OR BLEEDING *URINARY PROBLEMS (pain or burning when urinating, or frequent urination) *BOWEL PROBLEMS (unusual diarrhea, constipation, pain near the anus) TENDERNESS IN MOUTH AND THROAT WITH OR WITHOUT PRESENCE OF ULCERS (sore throat, sores in mouth, or a toothache) UNUSUAL RASH, SWELLING OR PAIN  UNUSUAL VAGINAL DISCHARGE OR ITCHING   Items with * indicate a potential emergency and should be followed up as soon as possible or go to the Emergency Department if any problems should occur.  Please show the CHEMOTHERAPY ALERT CARD or IMMUNOTHERAPY ALERT  CARD at check-in to the Emergency Department and triage nurse.  Should you have questions after your visit or need to cancel or reschedule your appointment, please contact [place] [facility]  Dept: [phone]  and follow the prompts.  Office hours are 8:00 a.m. to 4:30 p.m. Monday - Friday. Please note that voicemails left after 4:00 p.m. may not be returned until the following business day.  We are closed weekends and major holidays. You have access to a nurse at all times for urgent questions. Please call the main number to the clinic Dept: [phone] and follow the prompts.   For any non-urgent questions, you may also contact your provider using MyChart. We now offer e-Visits for anyone 85 and older to request care online for non-urgent symptoms. For details visit [email or web address].   Also download the MyChart app! Go to the app store, search "MyChart", open the app, select , and log in with your MyChart username and password.  Obinutuzumab Injection What is this medication? OBINUTUZUMAB (OH bi nue TOOZ ue mab) treats leukemia and lymphoma. It works by blocking a protein that causes cancer cells to grow and multiply. This helps to slow or stop the spread of cancer cells. It is a monoclonal antibody. This medicine may be used for other purposes; ask your health care provider or pharmacist if you have questions. COMMON BRAND NAME(S): GAZYVA What should I tell my care team before I take this medication? They need to know if you have any of these conditions: Heart disease Infection, especially a viral infection, such as hepatitis B Lung or breathing  disease Take medications that treat or prevent blood clots An unusual or allergic reaction to obinutuzumab, other medications, foods, dyes, or preservatives Pregnant or trying to get pregnant Breastfeeding How should I use this medication? This medication is for infusion into a vein. It is given by  a care team in a hospital or clinic setting. Talk to your care team about the use of this medication in children. Special care may be needed. Overdosage: If you think you have taken too much of this medicine contact a poison control center or emergency room at once. NOTE: This medicine is only for you. Do not share this medicine with others. What if I miss a dose? Keep appointments for follow-up doses as directed. It is important not to miss your dose. Call your care team if you are unable to keep an appointment. What may interact with this medication? Live virus vaccines This list may not describe all possible interactions. Give your health care provider a list of all the medicines, herbs, non-prescription drugs, or dietary supplements you use. Also tell them if you smoke, drink alcohol, or use illegal drugs. Some items may interact with your medicine. What should I watch for while using this medication? Report any side effects that you notice during your treatment right away, such as changes in your breathing, fever, chills, dizziness or lightheadedness. These effects are more common with the first dose. Visit your care team for checks on your progress. You will need to have regular blood work. Report any other side effects. The side effects of this medication can continue after you finish your treatment. Continue your course of treatment even though you feel ill unless your care team tells you to stop. Call your care team for advice if you get a fever, chills or sore throat, or other symptoms of a cold or flu. Do not treat yourself. This medication decreases your body's ability to fight infections. Try to avoid being around people who are sick. This medication may increase your risk to bruise or bleed. Call your care team if you notice any unusual bleeding. Do not become pregnant while taking this medication or for 6 months after stopping it. Inform your care team if you wish to become pregnant or  think you might be pregnant. There is a potential for serious side effects to an unborn child. Talk to your care team or pharmacist for more information. Do not breast-feed an infant while taking this medication or for 6 months after stopping it. What side effects may I notice from receiving this medication? Side effects that you should report to your care team as soon as possible: Allergic reactions--skin rash, itching, hives, swelling of the face, lips, tongue, or throat Bleeding--bloody or black, tar-like stools, vomiting blood or brown material that looks like coffee grounds, red or dark brown urine, small red or purple spots on skin, unusual bruising or bleeding Blood clot--pain, swelling, or warmth in the leg, shortness of breath, chest pain Dizziness, loss of balance or coordination, confusion or trouble speaking Infection--fever, chills, cough, sore throat, wounds that don't heal, pain or trouble when passing urine, general feeling of discomfort or being unwell Infusion reactions--chest pain, shortness of breath or trouble breathing, feeling faint or lightheaded Liver injury--right upper belly pain, loss of appetite, nausea, light-colored stool, dark yellow or brown urine, yellowing skin or eyes, unusual weakness or fatigue Tumor lysis syndrome (TLS)--nausea, vomiting, diarrhea, decrease in the amount of urine, dark urine, unusual weakness or fatigue, confusion, muscle pain or  cramps, fast or irregular heartbeat, joint pain Side effects that usually do not require medical attention (report to your care team if they continue or are bothersome): Bone, joint, or muscle pain Constipation Diarrhea Fatigue Runny or stuffy nose Sore throat This list may not describe all possible side effects. Call your doctor for medical advice about side effects. You may report side effects to FDA at 1-800-FDA-1088. Where should I keep my medication? This medication is only given in a hospital or clinic and  will not be stored at home. NOTE: This sheet is a summary. It may not cover all possible information. If you have questions about this medicine, talk to your doctor, pharmacist, or health care provider.  2024 Elsevier/Gold Standard (2022-01-19 00:00:00)

## 2023-04-27 NOTE — Progress Notes (Signed)
Patient seen by Dr. Addison Naegeli are within treatment parameters.  Labs reviewed: and are not all within treatment parameters. Dr Candise Che is aware WBC 73.1  Per physician team, patient is ready for treatment and there are NO modifications to the treatment plan.

## 2023-04-27 NOTE — Progress Notes (Signed)
Hypersensitivity Reaction note  Date of event: 04/27/23 Time of event: 1358 Generic name of drug involved: Obinutuzumab  Name of provider notified of the hypersensitivity reaction: Dr. Candise Che and also, Daphane Shepherd, PA Was agent that likely caused hypersensitivity reaction added to Allergies List within EMR? yes Chain of events including reaction signs/symptoms, treatment administered, and outcome (e.g., drug resumed; drug discontinued; sent to Emergency Department; etc.) Patient tolerated the [facility] well for the first hour and twenty plus minutes then patient started to get restless, SOB, nauseated, flushed and complaining of lower back and buttocks pain. Infusion was started at 1233 and stopped at 1358 due to complaint. Patient was placed on oxygen and IV fluids were started. Dr. Candise Che was called and informed. Daphane Shepherd, PA was called to oversee the reaction process. IV Pepcid 20mg  IVPB was started at 1403. Patient was struggling to stay still due to pain in her back and "buttocks". 1406 125mg  of IV solumedrol was given. Patient developed a headache and her neck hurt her- she did have some radiation of the pain down her jaw. Zofran 4mg  was given for the nausea. Patient still had a headache, back pain- benadryl 12.5 was given. Patient BP did drop a little- into the 90 systolically, but recovered relatively quickly. See flowsheet for blood pressure trends. Patient had some restlessness and feelings of being cold- by 1435 all of patient symptoms had resolved.  Per Dr. Clyda Greener instructions- the infusion was restarted at 1438. At about 1535 patient started having the exact same reaction restart- infusion was stopped and Jae Dire w.PA was called/ Dr. Candise Che was called. 25mg  IV benadryl was given. 1556 due to her back pain and headache- 650mg  of acetaminophen was given. Patient vitals were overall good. The blood pressure did dip a small amount after 12.5mg  of benadryl was given at 1604, but was improved at  1613 115/44. Patient symptoms were slowly resolving. By the patient's discharge at about 1700- lung sounds were completely clear, no problems swallowing, completely A/OX4, headache and backache were gone and patient was able to use the bathroom tourinate. Patient was wheeled out in a wheelchair due to the mild sedation caused by the medications. Sister was present and able to get her home safely. Plan to use walker for the night for balance.  Mickeal Needy, RN 04/27/2023 3:05 PM

## 2023-04-28 ENCOUNTER — Encounter: Payer: Self-pay | Admitting: Hematology

## 2023-04-28 ENCOUNTER — Inpatient Hospital Stay: Payer: PPO

## 2023-04-28 ENCOUNTER — Other Ambulatory Visit: Payer: Self-pay

## 2023-04-28 ENCOUNTER — Other Ambulatory Visit (HOSPITAL_COMMUNITY): Payer: Self-pay

## 2023-04-28 MED ORDER — VENETOCLAX 10 & 50 & 100 MG PO TBPK
ORAL_TABLET | ORAL | 0 refills | Status: DC
Start: 2023-04-28 — End: 2023-05-23
  Filled 2023-04-28: qty 42, fill #0
  Filled 2023-05-01: qty 42, 28d supply, fill #0

## 2023-05-01 ENCOUNTER — Other Ambulatory Visit: Payer: Self-pay

## 2023-05-01 ENCOUNTER — Encounter: Payer: Self-pay | Admitting: Hematology

## 2023-05-01 ENCOUNTER — Other Ambulatory Visit (HOSPITAL_COMMUNITY): Payer: Self-pay

## 2023-05-01 NOTE — Telephone Encounter (Signed)
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  Counseled patient on administration, dosing, side effects, monitoring, drug-food interactions, safe handling, storage, and disposal.  Patient will take Venclexta 10mg  tablets, 2 tablets (20 mg) by mouth once daily with food and water for 7 days.  If tolerated - patient will increase to Venclexta 50mg  tablets, 1 tablet by mouth once daily with food and water for 7 days.  Subsequent ramp-up doses of 100mg  daily x 7 days, 200mg  daily x 7 days, and 400mg  daily (target maintenance dose) will be monitored per MD.  CBC, uric acid, and electrolytes will be monitored twice a week per MD. Hydration will be administered as needed per MD discretion..  Patient knows to avoid grapefruit and grapefruit juice, se[place] oranges and start fruit while on venetoclax.  Venclexta start date: 05/08/23  Adverse effects include but are not limited to: TLS, decreased blood counts, electrolyte abnormalities, diarrhea, nausea, fatigue, arthralgias/myalgias. Adverse effects include but are not limited to: TLS, decreased blood counts, electrolyte abnormalities, diarrhea, nausea, fatigue. TLS PPX: patient endorses continuing to take allopurinol 100 mg 2 times daily - she is aware to continue this to help prevent TLS. We also discussed ensuring she increases her fluid intake as well to stay hydrated.   Patient has anti-emetic on hand and knows to take it if nausea develops.   Patient will obtain anti diarrheal and alert the office of 4 or more loose stools above baseline.  Reviewed with patient importance of keeping a medication schedule and plan for any missed doses. No barriers to medication adherence identified.  Medication reconciliation performed and medication/allergy list updated.  All questions  answered.  Ms. Rathje voiced understanding and appreciation.   Medication education handout and medication calendar placed in mail for patient. Patient knows to call the office with questions or concerns. Oral Chemotherapy Clinic phone number provided to patient.   Lenord Carbo, PharmD, BCPS, BCOP Hematology/Oncology Clinical Pharmacist [facility] Oral Chemotherapy Navigation Clinics [phone] 05/01/2023 2:24 PM

## 2023-05-02 ENCOUNTER — Other Ambulatory Visit (HOSPITAL_COMMUNITY): Payer: Self-pay | Admitting: Internal Medicine

## 2023-05-02 ENCOUNTER — Other Ambulatory Visit: Payer: Self-pay

## 2023-05-02 ENCOUNTER — Other Ambulatory Visit (HOSPITAL_COMMUNITY): Payer: Self-pay

## 2023-05-02 MED ORDER — HYDRALAZINE HCL 25 MG PO TABS
25.0000 mg | ORAL_TABLET | Freq: Two times a day (BID) | ORAL | 2 refills | Status: DC
  Filled 2023-05-02: qty 180, 90d supply, fill #0
  Filled 2023-08-01: qty 180, 90d supply, fill #1
  Filled 2023-11-07: qty 180, 90d supply, fill #2

## 2023-05-03 ENCOUNTER — Other Ambulatory Visit (HOSPITAL_COMMUNITY): Payer: Self-pay

## 2023-05-03 ENCOUNTER — Telehealth: Payer: Self-pay | Admitting: Hematology

## 2023-05-03 ENCOUNTER — Encounter: Payer: Self-pay | Admitting: Hematology

## 2023-05-03 NOTE — Telephone Encounter (Signed)
Patient is aware of scheduled appointment times/dates

## 2023-05-04 ENCOUNTER — Other Ambulatory Visit: Payer: Self-pay

## 2023-05-05 ENCOUNTER — Other Ambulatory Visit: Payer: PPO

## 2023-05-05 ENCOUNTER — Ambulatory Visit: Payer: PPO

## 2023-05-09 ENCOUNTER — Other Ambulatory Visit: Payer: Self-pay

## 2023-05-09 ENCOUNTER — Encounter: Payer: Self-pay | Admitting: Hematology

## 2023-05-11 ENCOUNTER — Other Ambulatory Visit (HOSPITAL_COMMUNITY): Payer: Self-pay

## 2023-05-11 DIAGNOSIS — G4733 Obstructive sleep apnea (adult) (pediatric): Secondary | ICD-10-CM | POA: Diagnosis not present

## 2023-05-15 ENCOUNTER — Encounter: Payer: Self-pay | Admitting: Hematology

## 2023-05-17 ENCOUNTER — Other Ambulatory Visit: Payer: Self-pay

## 2023-05-17 ENCOUNTER — Inpatient Hospital Stay: Payer: PPO

## 2023-05-17 ENCOUNTER — Inpatient Hospital Stay: Payer: PPO | Attending: Hematology

## 2023-05-17 VITALS — BP 132/58 | HR 68 | Temp 98.4°F | Resp 17

## 2023-05-17 DIAGNOSIS — Z7982 Long term (current) use of aspirin: Secondary | ICD-10-CM | POA: Diagnosis not present

## 2023-05-17 DIAGNOSIS — C9110 Chronic lymphocytic leukemia of B-cell type not having achieved remission: Secondary | ICD-10-CM

## 2023-05-17 DIAGNOSIS — M79662 Pain in left lower leg: Secondary | ICD-10-CM | POA: Insufficient documentation

## 2023-05-17 DIAGNOSIS — Z79899 Other long term (current) drug therapy: Secondary | ICD-10-CM | POA: Insufficient documentation

## 2023-05-17 DIAGNOSIS — Z7969 Long term (current) use of other immunomodulators and immunosuppressants: Secondary | ICD-10-CM | POA: Insufficient documentation

## 2023-05-17 DIAGNOSIS — D801 Nonfamilial hypogammaglobulinemia: Secondary | ICD-10-CM | POA: Diagnosis not present

## 2023-05-17 DIAGNOSIS — C8300 Small cell B-cell lymphoma, unspecified site: Secondary | ICD-10-CM

## 2023-05-17 DIAGNOSIS — Z7951 Long term (current) use of inhaled steroids: Secondary | ICD-10-CM | POA: Insufficient documentation

## 2023-05-17 DIAGNOSIS — D696 Thrombocytopenia, unspecified: Secondary | ICD-10-CM | POA: Insufficient documentation

## 2023-05-17 LAB — CBC WITH DIFFERENTIAL (CANCER CENTER ONLY)
Abs Immature Granulocytes: 0.15 10*3/uL — ABNORMAL HIGH (ref 0.00–0.07)
Basophils Absolute: 0.1 10*3/uL (ref 0.0–0.1)
Basophils Relative: 1 %
Eosinophils Absolute: 0.1 10*3/uL (ref 0.0–0.5)
Eosinophils Relative: 1 %
HCT: 33.1 % — ABNORMAL LOW (ref 36.0–46.0)
Hemoglobin: 10.2 g/dL — ABNORMAL LOW (ref 12.0–15.0)
Immature Granulocytes: 1 %
Lymphocytes Relative: 74 %
Lymphs Abs: 21.4 10*3/uL — ABNORMAL HIGH (ref 0.7–4.0)
MCH: 28.3 pg (ref 26.0–34.0)
MCHC: 30.8 g/dL (ref 30.0–36.0)
MCV: 91.9 fL (ref 80.0–100.0)
Monocytes Absolute: 2.1 10*3/uL — ABNORMAL HIGH (ref 0.1–1.0)
Monocytes Relative: 8 %
Neutro Abs: 4.2 10*3/uL (ref 1.7–7.7)
Neutrophils Relative %: 15 %
Platelet Count: 94 10*3/uL — ABNORMAL LOW (ref 150–400)
RBC: 3.6 MIL/uL — ABNORMAL LOW (ref 3.87–5.11)
RDW: 19.2 % — ABNORMAL HIGH (ref 11.5–15.5)
Smear Review: NORMAL
WBC Count: 28.2 10*3/uL — ABNORMAL HIGH (ref 4.0–10.5)
nRBC: 0.1 % (ref 0.0–0.2)

## 2023-05-17 LAB — CMP (CANCER CENTER ONLY)
ALT: 20 U/L (ref 0–44)
AST: 20 U/L (ref 15–41)
Albumin: 4.4 g/dL (ref 3.5–5.0)
Alkaline Phosphatase: 69 U/L (ref 38–126)
Anion gap: 6 (ref 5–15)
BUN: 30 mg/dL — ABNORMAL HIGH (ref 8–23)
CO2: 24 mmol/L (ref 22–32)
Calcium: 9.2 mg/dL (ref 8.9–10.3)
Chloride: 103 mmol/L (ref 98–111)
Creatinine: 1.04 mg/dL — ABNORMAL HIGH (ref 0.44–1.00)
GFR, Estimated: 57 mL/min — ABNORMAL LOW
Glucose, Bld: 99 mg/dL (ref 70–99)
Potassium: 5.7 mmol/L — ABNORMAL HIGH (ref 3.5–5.1)
Sodium: 133 mmol/L — ABNORMAL LOW (ref 135–145)
Total Bilirubin: 0.5 mg/dL (ref 0.3–1.2)
Total Protein: 6.9 g/dL (ref 6.5–8.1)

## 2023-05-17 LAB — IRON AND IRON BINDING CAPACITY (CC-WL,HP ONLY)
Iron: 110 ug/dL (ref 28–170)
Saturation Ratios: 25 % (ref 10.4–31.8)
TIBC: 449 ug/dL (ref 250–450)
UIBC: 339 ug/dL (ref 148–442)

## 2023-05-17 LAB — FERRITIN: Ferritin: 34 ng/mL (ref 11–307)

## 2023-05-17 LAB — LACTATE DEHYDROGENASE: LDH: 211 U/L — ABNORMAL HIGH (ref 98–192)

## 2023-05-17 LAB — SAMPLE TO BLOOD BANK

## 2023-05-17 MED ORDER — METHYLPREDNISOLONE SODIUM SUCC 40 MG IJ SOLR
40.0000 mg | Freq: Once | INTRAMUSCULAR | Status: AC
  Administered 2023-05-17: 40 mg via INTRAVENOUS
  Filled 2023-05-17: qty 1

## 2023-05-17 MED ORDER — IMMUNE GLOBULIN (HUMAN) 10 GM/100ML IV SOLN
40.0000 g | Freq: Once | INTRAVENOUS | Status: AC
  Administered 2023-05-17: 40 g via INTRAVENOUS
  Filled 2023-05-17: qty 400

## 2023-05-17 MED ORDER — DIPHENHYDRAMINE HCL 25 MG PO CAPS
25.0000 mg | ORAL_CAPSULE | Freq: Once | ORAL | Status: AC
  Administered 2023-05-17: 25 mg via ORAL
  Filled 2023-05-17: qty 1

## 2023-05-17 MED ORDER — ACETAMINOPHEN 325 MG PO TABS
650.0000 mg | ORAL_TABLET | Freq: Once | ORAL | Status: AC
  Administered 2023-05-17: 650 mg via ORAL
  Filled 2023-05-17: qty 2

## 2023-05-17 MED ORDER — DEXTROSE 5 % IV SOLN: Freq: Once | INTRAVENOUS | Status: AC

## 2023-05-17 NOTE — Progress Notes (Signed)
Pt declined to stay for 30 min post obs, discharged with VSS, ambulatory to lobby ?

## 2023-05-17 NOTE — Patient Instructions (Signed)

## 2023-05-18 ENCOUNTER — Encounter: Payer: Self-pay | Admitting: Hematology

## 2023-05-18 ENCOUNTER — Other Ambulatory Visit (HOSPITAL_COMMUNITY): Payer: Self-pay

## 2023-05-18 ENCOUNTER — Other Ambulatory Visit: Payer: Self-pay

## 2023-05-18 DIAGNOSIS — C9110 Chronic lymphocytic leukemia of B-cell type not having achieved remission: Secondary | ICD-10-CM

## 2023-05-19 ENCOUNTER — Inpatient Hospital Stay: Payer: PPO

## 2023-05-19 ENCOUNTER — Other Ambulatory Visit: Payer: Self-pay

## 2023-05-19 DIAGNOSIS — C9110 Chronic lymphocytic leukemia of B-cell type not having achieved remission: Secondary | ICD-10-CM | POA: Diagnosis not present

## 2023-05-19 LAB — CBC WITH DIFFERENTIAL (CANCER CENTER ONLY)
Abs Immature Granulocytes: 0.2 10*3/uL — ABNORMAL HIGH (ref 0.00–0.07)
Basophils Absolute: 0.2 10*3/uL — ABNORMAL HIGH (ref 0.0–0.1)
Basophils Relative: 0 %
Eosinophils Absolute: 0.1 10*3/uL (ref 0.0–0.5)
Eosinophils Relative: 0 %
HCT: 33.5 % — ABNORMAL LOW (ref 36.0–46.0)
Hemoglobin: 10.1 g/dL — ABNORMAL LOW (ref 12.0–15.0)
Immature Granulocytes: 1 %
Lymphocytes Relative: 80 %
Lymphs Abs: 28.6 10*3/uL — ABNORMAL HIGH (ref 0.7–4.0)
MCH: 28.4 pg (ref 26.0–34.0)
MCHC: 30.1 g/dL (ref 30.0–36.0)
MCV: 94.1 fL (ref 80.0–100.0)
Monocytes Absolute: 2.6 10*3/uL — ABNORMAL HIGH (ref 0.1–1.0)
Monocytes Relative: 7 %
Neutro Abs: 4.5 10*3/uL (ref 1.7–7.7)
Neutrophils Relative %: 12 %
Platelet Count: 107 10*3/uL — ABNORMAL LOW (ref 150–400)
RBC: 3.56 MIL/uL — ABNORMAL LOW (ref 3.87–5.11)
RDW: 19.8 % — ABNORMAL HIGH (ref 11.5–15.5)
Smear Review: NORMAL
WBC Count: 36.1 10*3/uL — ABNORMAL HIGH (ref 4.0–10.5)
nRBC: 0 % (ref 0.0–0.2)

## 2023-05-19 LAB — CMP (CANCER CENTER ONLY)
ALT: 24 U/L (ref 0–44)
AST: 22 U/L (ref 15–41)
Albumin: 4.4 g/dL (ref 3.5–5.0)
Alkaline Phosphatase: 67 U/L (ref 38–126)
Anion gap: 6 (ref 5–15)
BUN: 35 mg/dL — ABNORMAL HIGH (ref 8–23)
CO2: 25 mmol/L (ref 22–32)
Calcium: 9.3 mg/dL (ref 8.9–10.3)
Chloride: 101 mmol/L (ref 98–111)
Creatinine: 1.22 mg/dL — ABNORMAL HIGH (ref 0.44–1.00)
GFR, Estimated: 47 mL/min — ABNORMAL LOW (ref >60)
Glucose, Bld: 166 mg/dL — ABNORMAL HIGH (ref 70–99)
Potassium: 4.8 mmol/L (ref 3.5–5.1)
Sodium: 132 mmol/L — ABNORMAL LOW (ref 135–145)
Total Bilirubin: 0.4 mg/dL (ref 0.3–1.2)
Total Protein: 7.5 g/dL (ref 6.5–8.1)

## 2023-05-19 LAB — LACTATE DEHYDROGENASE: LDH: 184 U/L (ref 98–192)

## 2023-05-22 ENCOUNTER — Inpatient Hospital Stay: Payer: PPO

## 2023-05-22 ENCOUNTER — Other Ambulatory Visit: Payer: PPO

## 2023-05-22 ENCOUNTER — Other Ambulatory Visit: Payer: Self-pay

## 2023-05-22 ENCOUNTER — Inpatient Hospital Stay (HOSPITAL_BASED_OUTPATIENT_CLINIC_OR_DEPARTMENT_OTHER): Payer: PPO | Admitting: Hematology

## 2023-05-22 VITALS — BP 111/50 | HR 93 | Temp 99.1°F | Resp 17 | Wt 209.3 lb

## 2023-05-22 DIAGNOSIS — C9110 Chronic lymphocytic leukemia of B-cell type not having achieved remission: Secondary | ICD-10-CM

## 2023-05-22 DIAGNOSIS — D801 Nonfamilial hypogammaglobulinemia: Secondary | ICD-10-CM | POA: Diagnosis not present

## 2023-05-22 LAB — CMP (CANCER CENTER ONLY)
ALT: 22 U/L (ref 0–44)
AST: 21 U/L (ref 15–41)
Albumin: 4.4 g/dL (ref 3.5–5.0)
Alkaline Phosphatase: 65 U/L (ref 38–126)
Anion gap: 6 (ref 5–15)
BUN: 35 mg/dL — ABNORMAL HIGH (ref 8–23)
CO2: 26 mmol/L (ref 22–32)
Calcium: 9.6 mg/dL (ref 8.9–10.3)
Chloride: 101 mmol/L (ref 98–111)
Creatinine: 1.22 mg/dL — ABNORMAL HIGH (ref 0.44–1.00)
GFR, Estimated: 47 mL/min — ABNORMAL LOW (ref >60)
Glucose, Bld: 115 mg/dL — ABNORMAL HIGH (ref 70–99)
Potassium: 5.4 mmol/L — ABNORMAL HIGH (ref 3.5–5.1)
Sodium: 133 mmol/L — ABNORMAL LOW (ref 135–145)
Total Bilirubin: 0.6 mg/dL (ref 0.3–1.2)
Total Protein: 7.5 g/dL (ref 6.5–8.1)

## 2023-05-22 LAB — CBC WITH DIFFERENTIAL (CANCER CENTER ONLY)
Abs Immature Granulocytes: 0.22 10*3/uL — ABNORMAL HIGH (ref 0.00–0.07)
Basophils Absolute: 0.2 10*3/uL — ABNORMAL HIGH (ref 0.0–0.1)
Basophils Relative: 1 %
Eosinophils Absolute: 0.1 10*3/uL (ref 0.0–0.5)
Eosinophils Relative: 0 %
HCT: 33.3 % — ABNORMAL LOW (ref 36.0–46.0)
Hemoglobin: 10.4 g/dL — ABNORMAL LOW (ref 12.0–15.0)
Immature Granulocytes: 1 %
Lymphocytes Relative: 76 %
Lymphs Abs: 26.5 10*3/uL — ABNORMAL HIGH (ref 0.7–4.0)
MCH: 28.8 pg (ref 26.0–34.0)
MCHC: 31.2 g/dL (ref 30.0–36.0)
MCV: 92.2 fL (ref 80.0–100.0)
Monocytes Absolute: 2.7 10*3/uL — ABNORMAL HIGH (ref 0.1–1.0)
Monocytes Relative: 8 %
Neutro Abs: 4.9 10*3/uL (ref 1.7–7.7)
Neutrophils Relative %: 14 %
Platelet Count: 98 10*3/uL — ABNORMAL LOW (ref 150–400)
RBC: 3.61 MIL/uL — ABNORMAL LOW (ref 3.87–5.11)
RDW: 19 % — ABNORMAL HIGH (ref 11.5–15.5)
Smear Review: NORMAL
WBC Count: 34.5 10*3/uL — ABNORMAL HIGH (ref 4.0–10.5)
nRBC: 0.1 % (ref 0.0–0.2)

## 2023-05-22 LAB — URIC ACID: Uric Acid, Serum: 5.6 mg/dL (ref 2.5–7.1)

## 2023-05-22 NOTE — Progress Notes (Signed)
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  INTERVAL HISTORY:  Janice [name] is a 72 y.o. 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  Patient was last seen by me on 04/27/2023 and she complained of bilateral shin pain, congestion, and mild bilateral leg swelling. She started cycle 1 of her treatment, but became symptomatic after receiving approximately 35 ml of Gazyva. Her symptoms included back pain, throat tightness, drop in blood pressure, and SOB. Janice [name] was discontinued.    Patient notes she has been doing well overall since our last visit. She has started taking Venetoclax and has been tolerating it with mild toxicity of mild leg swelling. She notes that her bilateral leg swelling lasted for 1-2 days. She denies leg swelling during this visit.   She denies any new infection issues, fever, chills, night sweats, chest pain, abdominal pain, back pain, abnormal bowel movement. She does report of mild enlarged lymph nodes near her left neck, which have decreased in size. She has also noticed mild enlarged lymph node near her right armpit.   Patient also complains of occasional left shin pain.    She is complaint with all of her medications. She denies taking potassium supplement.     MEDICAL HISTORY:  Past Medical History:  Diagnosis Date   Allergy    Asthma    seasonal   Cellulitis 2013   Left toe   Chronic kidney disease    stage 1   Cirrhosis (HCC)    Common migraine    History of   Complication of anesthesia    Per pt/had breathing problems with  "block" during rotator cuff surgery. 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   Coronary artery disease    DDD (degenerative disc disease)    Depression    denies takes paxil for migraines   Diabetes mellitus    type 2   Diabetic peripheral neuropathy (HCC)    Difficult airway for intubation 10/20/2022   DLx 1 with MAC 4, unable to see cords, DL with lopro S3 glidescope, easy atraumatic intubation   Difficult intubation 03/03/2023   Diverticulitis    Diverticulosis    DJD (degenerative joint disease)    Esophageal varices (HCC)    GERD (gastroesophageal reflux disease)    History of colon polyps    hyperplastic   History of gastric polyp    Hyperlipidemia    Hypertension    Iron deficiency anemia    Obesity    OSA on CPAP    cpap   Peripheral neuropathy    Pneumonia    april 2020  mild   Primary localized osteoarthritis of left knee 03/27/2019   Sensorineural hearing loss    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HCC)     SURGICAL HISTORY: Past Surgical History:  Procedure Laterality Date   ANKLE SURGERY     Left    BREAST BIOPSY Left 2021   left axilla, lymphoma   COLONOSCOPY  05/2018   ESOPHAGOGASTRODUODENOSCOPY (EGD) WITH PROPOFOL N/A 03/09/2023   Procedure: ESOPHAGOGASTRODUODENOSCOPY (EGD) WITH PROPOFOL;  Surgeon: Iva Boop, MD;  Location: [facility];  Service: Gastroenterology;  Laterality: N/A;   LEEP N/A 09/14/2018  Procedure: LOOP ELECTROSURGICAL EXCISION PROCEDURE (LEEP);  Surgeon: Richardean Chimera, MD;  Location: [facility];  Service: Gynecology;  Laterality: N/A;   MASS EXCISION Right 06/24/2021   Procedure: EXCISION RIGHT POSTERIOR NECK MASS;  Surgeon: Kinsinger, De Blanch, MD;  Location: [facility];  Service: General;  Laterality: Right;   ROTATOR CUFF REPAIR Bilateral 2012, 2015   TONSILLECTOMY     TOTAL KNEE ARTHROPLASTY Left 04/08/2019   Procedure: TOTAL KNEE ARTHROPLASTY;  Surgeon: Salvatore Marvel, MD;  Location: [facility];  Service: Orthopedics;   Laterality: Left;   UMBILICAL HERNIA REPAIR N/A 10/20/2022   Procedure: OPEN HERNIA REPAIR UMBILICAL ADULT with Mesh;  Surgeon: Kinsinger, De Blanch, MD;  Location: [facility];  Service: General;  Laterality: N/A;   UPPER GI ENDOSCOPY  05/2018    SOCIAL HISTORY: Social History   Socioeconomic History   Marital status: Widowed    Spouse name: Not on file   Number of children: 0   Years of education: 3 years college   Highest education level: Not on file  Occupational History   Occupation: Unemployed  Tobacco Use   Smoking status: Never    Passive exposure: Past   Smokeless tobacco: Never  Vaping Use   Vaping status: Never Used  Substance and Sexual Activity   Alcohol use: Not Currently    Comment: very rare   Drug use: No   Sexual activity: Not Currently    Birth control/protection: None, Post-menopausal  Other Topics Concern   Not on file  Social History Narrative   1 caffeine drink daily    Right-handed   widow (husband died from [place] cirrhosis)   Social Determinants of Health   Financial Resource Strain: Medium Risk (08/20/2020)   Overall Financial Resource Strain (CARDIA)    Difficulty of Paying Living Expenses: Somewhat hard  Food Insecurity: No Food Insecurity (10/20/2022)   Hunger Vital Sign    Worried About Running Out of Food in the Last Year: Never true    Ran Out of Food in the Last Year: Never true  Transportation Needs: No Transportation Needs (10/20/2022)   PRAPARE - Administrator, Civil Service (Medical): No    Lack of Transportation (Non-Medical): No  Physical Activity: Not on file  Stress: Not on file  Social Connections: Unknown (01/25/2022)   [facility]   Social Network    Social Network: Not on file  Intimate Partner Violence: Not At Risk (10/20/2022)   Humiliation, Afraid, Rape, and Kick questionnaire    Fear of Current or Ex-Partner: No    Emotionally Abused: No    Physically Abused: No    Sexually Abused:  No    FAMILY HISTORY: Family History  Problem Relation Age of Onset   Breast cancer Mother    Bone cancer Mother    Heart failure Father    Diabetes Mellitus II Father    Hypertension Father    Diabetes Mellitus II Sister    Obesity Sister    Other Sister        retina problem   Hypertension Brother    Diabetes Mellitus II Brother    Colon cancer Neg Hx    Rectal cancer Neg Hx    Stomach cancer Neg Hx    Esophageal cancer Neg Hx    Pancreatic cancer Neg Hx    Liver disease Neg Hx     ALLERGIES:  is allergic to breo ellipta [fluticasone furoate-vilanterol], gazyva [obinutuzumab], iodine, latex, tessalon [  benzonatate], zocor [simvastatin], levaquin [levofloxacin], penicillins, povidone-iodine, wellbutrin [bupropion], amoxicillin, chlorhexidine, diflucan [fluconazole], thalitone [chlorthalidone], amoxicillin-pot clavulanate, and vibra-tab [doxycycline].  MEDICATIONS:  Current Outpatient Medications  Medication Sig Dispense Refill   albuterol (VENTOLIN HFA) 108 (90 Base) MCG/ACT inhaler Inhale 1-2 puffs into the lungs every 6 (six) hours as needed (Cough). 18 g 0   allopurinol (ZYLOPRIM) 100 MG tablet Take 1 tablet (100 mg) by mouth 2 times daily. 60 tablet 1   amLODipine (NORVASC) 10 MG tablet Take 1 tablet (10 mg total) by mouth daily. 90 tablet 3   aspirin EC 81 MG tablet Take 1 tablet (81 mg total) by mouth daily. Swallow whole. 90 tablet 3   azelastine (ASTELIN) 0.1 % nasal spray Place 1 spray into both nostrils 2 (two) times daily. Use in each nostril as directed 9 mL 11   b complex vitamins capsule Take 1 capsule by mouth in the morning.     bacitracin 500 UNIT/GM ointment Apply topically 2 (two) times daily for 10 days 30 g 0   budesonide-formoterol (SYMBICORT) 80-4.5 MCG/ACT inhaler Inhale 2 puffs into the lungs daily. (Patient taking differently: Inhale 2 puffs into the lungs 2 (two) times daily. Patient usually does 2 puffs in morning and 1 puff in evening.) 10.2 g 12    CALCIUM PO Take 1,000 mg by mouth every morning. 500 mg each     Carboxymethylcellul-Glycerin (REFRESH OPTIVE PF OP) Place 1 drop into both eyes 3 (three) times daily as needed (dry/irritated eyes.). Non-pres     cephALEXin (KEFLEX) 500 MG capsule Take 2 capsules (1,000 mg total) by mouth as directed 1 hour prior to dental work. 20 capsule 0   cetirizine (ZYRTEC) 10 MG tablet Take 5 mg by mouth in the morning.     Coenzyme Q10 (CO Q-10) 100 MG CAPS Take 100 mg by mouth in the morning.     Continuous Glucose Sensor (FREESTYLE LIBRE 2 SENSOR) MISC Use to monitor blood glucose continuously - change every 14 days. 6 each 3   desoximetasone (TOPICORT) 0.25 % cream Apply a small amount to skin twice a day as needed for flares 60 g 0   diclofenac sodium (VOLTAREN) 1 % GEL Apply 2 g topically 2 (two) times daily as needed (knee pain/ foot /Triger finger).  2   empagliflozin (JARDIANCE) 10 MG TABS tablet Take 10 mg by mouth in the morning.     empagliflozin (JARDIANCE) 10 MG TABS tablet Take 1 tablet (10 mg total) by mouth daily. 90 tablet 3   fluticasone (FLONASE) 50 MCG/ACT nasal spray Place 2 sprays into both nostrils in the morning. (Patient taking differently: Place 2 sprays into both nostrils daily as needed for allergies.) 48 g 2   furosemide (LASIX) 20 MG tablet Take 1 tablet (20 mg total) by mouth daily. 90 tablet 3   gabapentin (NEURONTIN) 300 MG capsule Take 1 capsule by mouth twice a day (Patient taking differently: Take 600 mg by mouth at bedtime.) 180 capsule 3   glucose blood test strip Use to test blood sugar 3 times daily as directed 300 each 3   hydrALAZINE (APRESOLINE) 25 MG tablet Take 1 tablet (25 mg total) by mouth 2 (two) times daily. 180 tablet 2   insulin aspart (NOVOLOG FLEXPEN) 100 UNIT/ML FlexPen inject 30 units under the skin with breakfast, 20 units with lunch, and 10 units with supper (Patient taking differently: Inject 30 Units into the skin with breakfast, with lunch, and with  evening meal.) 30  mL 11   insulin aspart (NOVOLOG FLEXPEN) 100 UNIT/ML FlexPen Inject 30 Units into the skin daily with breakfast AND 30 Units daily with lunch AND 30 Units daily with supper. 90 mL 3   insulin degludec (TRESIBA FLEXTOUCH) 200 UNIT/ML FlexTouch Pen Inject 80 Units into the skin daily. 30 mL 11   Insulin Pen Needle (BD PEN NEEDLE NANO 2ND GEN) 32G X 4 MM MISC Use as directed 6 times daily 400 each 5   irbesartan (AVAPRO) 300 MG tablet Take 1 tablet (300 mg total) by mouth in the morning. 90 tablet 3   Lancets (ONETOUCH DELICA PLUS LANCET33G) MISC Use to test blood sugar 3 times daily 300 each 2   LORazepam (ATIVAN) 0.5 MG tablet Take 1-2 tablets (0.5-1 mg total) by mouth once as needed for up to 1 dose (prior to MRI or CT scan for claustrophobia). 4 tablet 0   metFORMIN (GLUCOPHAGE-XR) 500 MG 24 hr tablet Take 2 tablets (1,000 mg total) by mouth 2 (two) times daily. 360 tablet 3   omeprazole (PRILOSEC) 40 MG capsule Take 1 capsule by mouth twice daily 180 capsule 3   ondansetron (ZOFRAN-ODT) 4 MG disintegrating tablet Dissolve 1 tablet (4 mg) by mouth every 8 hours as needed for nausea or vomiting. 20 tablet 0   rosuvastatin (CRESTOR) 10 MG tablet Take 1 tablet (10 mg total) by mouth daily. (Patient taking differently: Take 10 mg by mouth at bedtime.) 90 tablet 2   sertraline (ZOLOFT) 50 MG tablet Take 1 tablet (50 mg total) by mouth daily. (Patient taking differently: Take 50 mg by mouth every evening.) 90 tablet 3   spironolactone (ALDACTONE) 50 MG tablet Take 1 tablet (50 mg total) by mouth daily. 90 tablet 3   terconazole (TERAZOL 3) 0.8 % vaginal cream Place 1 applicator vaginally daily as needed (yeast infections).     venetoclax (VENCLEXTA) 10 & 50 & 100 MG Starter Pack Take by mouth daily. Take 20 mg for 7 days, then 50 mg daily x 7d, then 100 mg daily x 7d, then 200 mg daily x 7d. Take with food & water. 42 tablet 0   No current facility-administered medications for this  visit.    REVIEW OF SYSTEMS:    10 Point review of Systems was done is negative except as noted above.   PHYSICAL EXAMINATION: .There were no vitals taken for this visit. GENlERAL:alert, in no acute distress and comfortable SKIN: no acute rashes, no significant lesions EYES: conjunctiva are pink and non-injected, sclera anicteric OROPHARYNX: MMM, no exudates, no oropharyngeal erythema or ulceration NECK: supple, no JVD LYMPH:  no palpable lymphadenopathy in the cervical, axillary or inguinal regions LUNGS: clear to auscultation b/l with normal respiratory effort HEART: regular rate & rhythm ABDOMEN:  normoactive bowel sounds , non tender, not distended. Extremity: no pedal edema PSYCH: alert & oriented x 3 with fluent speech NEURO: no focal motor/sensory deficits   LABORATORY DATA:  I have reviewed the data as listed  .    Latest Ref Rng & Units 05/19/2023    9:01 AM 05/17/2023   12:27 PM 04/27/2023    8:13 AM  CBC  WBC 4.0 - 10.5 K/uL 36.1  28.2  73.1   Hemoglobin 12.0 - 15.0 g/dL 08.6  57.8  46.9   Hematocrit 36.0 - 46.0 % 33.5  33.1  34.2   Platelets 150 - 400 K/uL 107  94  101    . CBC    Component Value Date/Time  WBC 36.1 (H) 05/19/2023 0901   WBC 56.9 (HH) 10/20/2022 1148   RBC 3.56 (L) 05/19/2023 0901   HGB 10.1 (L) 05/19/2023 0901   HGB 10.3 (L) 10/18/2021 1222   HCT 33.5 (L) 05/19/2023 0901   HCT 33.0 (L) 10/18/2021 1222   PLT 107 (L) 05/19/2023 0901   PLT 100 (LL) 10/18/2021 1222   MCV 94.1 05/19/2023 0901   MCV 84 10/18/2021 1222   MCH 28.4 05/19/2023 0901   MCHC 30.1 05/19/2023 0901   RDW 19.8 (H) 05/19/2023 0901   RDW 15.1 10/18/2021 1222   LYMPHSABS 28.6 (H) 05/19/2023 0901   MONOABS 2.6 (H) 05/19/2023 0901   EOSABS 0.1 05/19/2023 0901   BASOSABS 0.2 (H) 05/19/2023 0901     .    Latest Ref Rng & Units 05/19/2023    9:01 AM 05/17/2023   12:27 PM 04/27/2023    8:13 AM  CMP  Glucose 70 - 99 mg/dL 409  99  811   BUN 8 - 23 mg/dL 35  30  22    Creatinine 0.44 - 1.00 mg/dL 9.14  7.82  9.56   Sodium 135 - 145 mmol/L 132  133  138   Potassium 3.5 - 5.1 mmol/L 4.8  5.7  4.6   Chloride 98 - 111 mmol/L 101  103  104   CO2 22 - 32 mmol/L 25  24  26    Calcium 8.9 - 10.3 mg/dL 9.3  9.2  8.8   Total Protein 6.5 - 8.1 g/dL 7.5  6.9  7.1   Total Bilirubin 0.3 - 1.2 mg/dL 0.4  0.5  0.5   Alkaline Phos 38 - 126 U/L 67  69  75   AST 15 - 41 U/L 22  20  22    ALT 0 - 44 U/L 24  20  19     . Lab Results  Component Value Date   LDH 184 05/19/2023   . Lab Results  Component Value Date   IRON 110 05/17/2023   TIBC 449 05/17/2023   IRONPCTSAT 25 05/17/2023   (Iron and TIBC)  Lab Results  Component Value Date   FERRITIN 34 05/17/2023    04/08/2020 FISH/CLL Panel:   03/30/2020 Left Axilla Flow Pathology Report [id number]):   03/30/2020 Left axilla lymph node Bx ([id number]):   RADIOGRAPHIC STUDIES: I have personally reviewed the radiological images as listed and agreed with the findings in the report. No results found.  MRI abdomen with and without contrast 07/08/2021 showed IMPRESSION: 1. Moderate motion and patient body habitus degraded exam. 2. Extensive abdominal adenopathy, similar to slightly increased compared to 05/01/2020 PET. 3. Cirrhosis and hepatosplenomegaly with suspicion of periesophageal varices. 4. Nonspecific hypoenhancing or nonenhancing splenic lesions. These could simply represent Hessie Knows bodies in the setting of cirrhosis. Lymphomatous involvement cannot be excluded. 5. Heterogeneous enhancement throughout the liver, without well-defined suspicious mass. Possibly due to altered perfusion in the setting of cirrhosis and porta hepatis adenopathy. Lymphomatous involvement cannot be excluded. Consider pre and post-contrast MRI follow-up at 2-3 months. A more aggressive approach would include random liver biopsy.  MRI abdomen with and without contrast 02/04/2022 showed IMPRESSION: 1.  Examination is generally limited by breath motion artifact, particularly multiphasic contrast enhanced sequences. Within this limitation, cirrhotic morphology of the liver without focal liver lesion. 2. 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 3. Trace ascites. 4. Numerous enlarged gastrohepatic ligament, portacaval, retroperitoneal, and small  bowel mesenteric lymph nodes are unchanged, in keeping with history of lymphoma.  ASSESSMENT & PLAN:   72 yo female with   1) CLL/SLL -02/28/2020 MM Breast ([id number]) revealed "Bilateral axillary adenopathy." -03/30/2020 Left axilla lymph node biopsy ([id number]) revealed "CHRONIC LYMPHOCYTIC LYMPHOMA/SMALL LYMPHOCYTIC LYMPHOMA".  -03/30/2020 Left Axilla Flow Pathology Report [id number]) revealed "Monoclonal B-cell population with coexpression of CD5 comprises 80% of all lymphocytes". -04/20/20 PET/CT revealed 1. Adenopathy within the neck, chest, abdomen, and pelvis, consistent with active lymphoma. Much of this is not significantly hypermetabolic. Some upper abdominal nodes are moderately hypermetabolic.  2) Mild thrombocytopenia Most likely related to advanced liver cirrhosis, now with some decrease to 93k which could also be from CLL  #3 severe hypogammaglobinemia due to CLL IgG level 272 Component     Latest Ref Rng 09/20/2022  IgG (Immunoglobin G), Serum     586 - 1,602 mg/dL 235 (L)   IgA     64 - 422 mg/dL 15 (L)   IgM (Immunoglobulin M), Srm     26 - 217 mg/dL <5 (L)     Legend: (L) Low  #4 iron deficiency Anemia-- from Epistaxis + ?GI losses. NO overt GI bleeding noted. . Lab Results  Component Value Date   IRON 110 05/17/2023   TIBC 449 05/17/2023   IRONPCTSAT 25 05/17/2023   (Iron and TIBC)  Lab Results  Component Value Date   FERRITIN 34 05/17/2023    PLAN: -Discussed lab results from today, 05/22/2023, with  the patient. CBC shows elevated but improved WBC counts of 34.5 K, decreased hemoglobin of 10.4 g/dL, decreased hematocrit at 33.3%, and decreased platelets at 98 K. CMP shows slightly elevated glucose level of 115, elevated BUN at 35, elevated creatinine at 1.22, decreased sodium level of 133, and elevated potassium level of 5.4.  -Recommend to drink at least 64 ounces of water daily.  -Recommend to eat less potasium rich food.  -Recommend to start vitamin-D supplement 2,000 units once a day to help with bone pain.  -Patient tolerated her Venetoclax medication with mild toxicity of left leg swelling.  -Continue Venetoclax as prescribed without any dose modification.   -Continue IVIG every 4 weeks x 6.   FOLLOW-UP: Continue IVIG every 4 weeks x 6 Labs twice weekly on Monday and Thursday MD visit in 10-14 days  The total time spent in the appointment was *** minutes* .  All of the patient's questions were answered with apparent satisfaction. The patient knows to call the clinic with any problems, questions or concerns.   Wyvonnia Lora MD MS AAHIVMS [facility] Hematology/Oncology Physician [facility]  .*Total Encounter Time as defined by the Centers for Medicare and Medicaid Services includes, in addition to the face-to-face time of a patient visit (documented in the note above) non-face-to-face time: obtaining and reviewing outside history, ordering and reviewing medications, tests or procedures, care coordination (communications with other health care professionals or caregivers) and documentation in the medical record.   I,Param Shah,acting as a Neurosurgeon for Wyvonnia Lora, MD.,have documented all relevant documentation on the behalf of Wyvonnia Lora, MD,as directed by  Wyvonnia Lora, MD while in the presence of Wyvonnia Lora, MD.

## 2023-05-23 ENCOUNTER — Other Ambulatory Visit: Payer: Self-pay

## 2023-05-23 ENCOUNTER — Other Ambulatory Visit (HOSPITAL_COMMUNITY): Payer: Self-pay

## 2023-05-23 ENCOUNTER — Encounter: Payer: Self-pay | Admitting: Hematology

## 2023-05-23 DIAGNOSIS — C9110 Chronic lymphocytic leukemia of B-cell type not having achieved remission: Secondary | ICD-10-CM

## 2023-05-23 MED ORDER — VENETOCLAX 100 MG PO TABS
200.0000 mg | ORAL_TABLET | Freq: Every day | ORAL | 3 refills | Status: DC
  Filled 2023-05-23: qty 120, 60d supply, fill #0

## 2023-05-23 MED ORDER — VENETOCLAX 100 MG PO TABS
200.0000 mg | ORAL_TABLET | Freq: Every day | ORAL | 3 refills | Status: DC
  Filled 2023-05-23: qty 56, 28d supply, fill #0
  Filled 2023-05-23: qty 120, 60d supply, fill #0

## 2023-05-23 NOTE — Addendum Note (Signed)
Addended by: Wyvonnia Lora on: 05/23/2023 01:07 PM   Modules accepted: Orders

## 2023-05-24 ENCOUNTER — Other Ambulatory Visit: Payer: Self-pay

## 2023-05-24 DIAGNOSIS — C9110 Chronic lymphocytic leukemia of B-cell type not having achieved remission: Secondary | ICD-10-CM

## 2023-05-25 ENCOUNTER — Inpatient Hospital Stay: Payer: PPO

## 2023-05-25 DIAGNOSIS — C9110 Chronic lymphocytic leukemia of B-cell type not having achieved remission: Secondary | ICD-10-CM | POA: Diagnosis not present

## 2023-05-25 LAB — CMP (CANCER CENTER ONLY)
ALT: 23 U/L (ref 0–44)
AST: 22 U/L (ref 15–41)
Albumin: 4.4 g/dL (ref 3.5–5.0)
Alkaline Phosphatase: 64 U/L (ref 38–126)
Anion gap: 6 (ref 5–15)
BUN: 39 mg/dL — ABNORMAL HIGH (ref 8–23)
CO2: 23 mmol/L (ref 22–32)
Calcium: 9.1 mg/dL (ref 8.9–10.3)
Chloride: 102 mmol/L (ref 98–111)
Creatinine: 1.51 mg/dL — ABNORMAL HIGH (ref 0.44–1.00)
GFR, Estimated: 37 mL/min — ABNORMAL LOW (ref >60)
Glucose, Bld: 158 mg/dL — ABNORMAL HIGH (ref 70–99)
Potassium: 5.4 mmol/L — ABNORMAL HIGH (ref 3.5–5.1)
Sodium: 131 mmol/L — ABNORMAL LOW (ref 135–145)
Total Bilirubin: 0.5 mg/dL (ref 0.3–1.2)
Total Protein: 7.2 g/dL (ref 6.5–8.1)

## 2023-05-25 LAB — CBC WITH DIFFERENTIAL (CANCER CENTER ONLY)
Abs Immature Granulocytes: 0.14 10*3/uL — ABNORMAL HIGH (ref 0.00–0.07)
Basophils Absolute: 0.1 10*3/uL (ref 0.0–0.1)
Basophils Relative: 0 %
Eosinophils Absolute: 0.1 10*3/uL (ref 0.0–0.5)
Eosinophils Relative: 0 %
HCT: 32 % — ABNORMAL LOW (ref 36.0–46.0)
Hemoglobin: 9.9 g/dL — ABNORMAL LOW (ref 12.0–15.0)
Immature Granulocytes: 1 %
Lymphocytes Relative: 68 %
Lymphs Abs: 15.5 10*3/uL — ABNORMAL HIGH (ref 0.7–4.0)
MCH: 28.8 pg (ref 26.0–34.0)
MCHC: 30.9 g/dL (ref 30.0–36.0)
MCV: 93 fL (ref 80.0–100.0)
Monocytes Absolute: 1.3 10*3/uL — ABNORMAL HIGH (ref 0.1–1.0)
Monocytes Relative: 6 %
Neutro Abs: 5.6 10*3/uL (ref 1.7–7.7)
Neutrophils Relative %: 25 %
Platelet Count: 81 10*3/uL — ABNORMAL LOW (ref 150–400)
RBC: 3.44 MIL/uL — ABNORMAL LOW (ref 3.87–5.11)
RDW: 19 % — ABNORMAL HIGH (ref 11.5–15.5)
Smear Review: NORMAL
WBC Count: 22.7 10*3/uL — ABNORMAL HIGH (ref 4.0–10.5)
nRBC: 0 % (ref 0.0–0.2)

## 2023-05-25 LAB — URIC ACID: Uric Acid, Serum: 7.1 mg/dL (ref 2.5–7.1)

## 2023-05-25 LAB — LACTATE DEHYDROGENASE: LDH: 166 U/L (ref 98–192)

## 2023-05-26 ENCOUNTER — Other Ambulatory Visit: Payer: Self-pay

## 2023-05-26 ENCOUNTER — Other Ambulatory Visit (HOSPITAL_COMMUNITY): Payer: Self-pay

## 2023-05-26 DIAGNOSIS — R197 Diarrhea, unspecified: Secondary | ICD-10-CM | POA: Diagnosis not present

## 2023-05-26 DIAGNOSIS — C8300 Small cell B-cell lymphoma, unspecified site: Secondary | ICD-10-CM | POA: Diagnosis not present

## 2023-05-26 DIAGNOSIS — C9110 Chronic lymphocytic leukemia of B-cell type not having achieved remission: Secondary | ICD-10-CM

## 2023-05-26 DIAGNOSIS — R221 Localized swelling, mass and lump, neck: Secondary | ICD-10-CM | POA: Diagnosis not present

## 2023-05-26 DIAGNOSIS — R051 Acute cough: Secondary | ICD-10-CM | POA: Diagnosis not present

## 2023-05-26 DIAGNOSIS — M546 Pain in thoracic spine: Secondary | ICD-10-CM | POA: Diagnosis not present

## 2023-05-26 DIAGNOSIS — I10 Essential (primary) hypertension: Secondary | ICD-10-CM | POA: Diagnosis not present

## 2023-05-26 DIAGNOSIS — B349 Viral infection, unspecified: Secondary | ICD-10-CM | POA: Diagnosis not present

## 2023-05-26 DIAGNOSIS — R5383 Other fatigue: Secondary | ICD-10-CM | POA: Diagnosis not present

## 2023-05-26 DIAGNOSIS — Z1152 Encounter for screening for COVID-19: Secondary | ICD-10-CM | POA: Diagnosis not present

## 2023-05-26 DIAGNOSIS — I5189 Other ill-defined heart diseases: Secondary | ICD-10-CM | POA: Diagnosis not present

## 2023-05-26 MED ORDER — AZITHROMYCIN 250 MG PO TABS
ORAL_TABLET | ORAL | 0 refills | Status: DC
  Filled 2023-05-26: qty 6, 5d supply, fill #0

## 2023-05-27 ENCOUNTER — Encounter: Payer: Self-pay | Admitting: Hematology

## 2023-05-28 ENCOUNTER — Other Ambulatory Visit (HOSPITAL_COMMUNITY): Payer: Self-pay

## 2023-05-29 ENCOUNTER — Inpatient Hospital Stay: Payer: PPO

## 2023-05-29 ENCOUNTER — Other Ambulatory Visit: Payer: Self-pay

## 2023-05-30 ENCOUNTER — Other Ambulatory Visit (HOSPITAL_COMMUNITY): Payer: Self-pay

## 2023-05-30 ENCOUNTER — Telehealth: Payer: Self-pay | Admitting: Internal Medicine

## 2023-05-30 NOTE — Telephone Encounter (Signed)
Please schedule a follow-up appointment next available with me so I can review her liver disease situation and discuss possible repeat colonoscopy

## 2023-05-31 NOTE — Telephone Encounter (Signed)
Left message for pt to call back  °

## 2023-06-01 ENCOUNTER — Other Ambulatory Visit (HOSPITAL_COMMUNITY): Payer: Self-pay

## 2023-06-01 ENCOUNTER — Inpatient Hospital Stay: Payer: PPO

## 2023-06-01 ENCOUNTER — Other Ambulatory Visit: Payer: Self-pay

## 2023-06-01 DIAGNOSIS — C9110 Chronic lymphocytic leukemia of B-cell type not having achieved remission: Secondary | ICD-10-CM | POA: Diagnosis not present

## 2023-06-01 LAB — CMP (CANCER CENTER ONLY)
ALT: 22 U/L (ref 0–44)
AST: 19 U/L (ref 15–41)
Albumin: 4 g/dL (ref 3.5–5.0)
Alkaline Phosphatase: 67 U/L (ref 38–126)
Anion gap: 5 (ref 5–15)
BUN: 30 mg/dL — ABNORMAL HIGH (ref 8–23)
CO2: 23 mmol/L (ref 22–32)
Calcium: 9 mg/dL (ref 8.9–10.3)
Chloride: 103 mmol/L (ref 98–111)
Creatinine: 1.02 mg/dL — ABNORMAL HIGH (ref 0.44–1.00)
GFR, Estimated: 58 mL/min — ABNORMAL LOW (ref >60)
Glucose, Bld: 139 mg/dL — ABNORMAL HIGH (ref 70–99)
Potassium: 5.7 mmol/L — ABNORMAL HIGH (ref 3.5–5.1)
Sodium: 131 mmol/L — ABNORMAL LOW (ref 135–145)
Total Bilirubin: 0.4 mg/dL (ref 0.3–1.2)
Total Protein: 6.7 g/dL (ref 6.5–8.1)

## 2023-06-01 LAB — CBC WITH DIFFERENTIAL (CANCER CENTER ONLY)
Abs Immature Granulocytes: 0.07 10*3/uL (ref 0.00–0.07)
Basophils Absolute: 0.1 10*3/uL (ref 0.0–0.1)
Basophils Relative: 0 %
Eosinophils Absolute: 0.1 10*3/uL (ref 0.0–0.5)
Eosinophils Relative: 0 %
HCT: 29.5 % — ABNORMAL LOW (ref 36.0–46.0)
Hemoglobin: 9 g/dL — ABNORMAL LOW (ref 12.0–15.0)
Immature Granulocytes: 1 %
Lymphocytes Relative: 60 %
Lymphs Abs: 6.7 10*3/uL — ABNORMAL HIGH (ref 0.7–4.0)
MCH: 28.7 pg (ref 26.0–34.0)
MCHC: 30.5 g/dL (ref 30.0–36.0)
MCV: 93.9 fL (ref 80.0–100.0)
Monocytes Absolute: 0.8 10*3/uL (ref 0.1–1.0)
Monocytes Relative: 7 %
Neutro Abs: 3.6 10*3/uL (ref 1.7–7.7)
Neutrophils Relative %: 32 %
Platelet Count: 94 10*3/uL — ABNORMAL LOW (ref 150–400)
RBC: 3.14 MIL/uL — ABNORMAL LOW (ref 3.87–5.11)
RDW: 19.1 % — ABNORMAL HIGH (ref 11.5–15.5)
Smear Review: NORMAL
WBC Count: 11.2 10*3/uL — ABNORMAL HIGH (ref 4.0–10.5)
nRBC: 0 % (ref 0.0–0.2)

## 2023-06-01 LAB — LACTATE DEHYDROGENASE: LDH: 134 U/L (ref 98–192)

## 2023-06-01 LAB — URIC ACID: Uric Acid, Serum: 5.9 mg/dL (ref 2.5–7.1)

## 2023-06-01 MED ORDER — PREDNISONE 5 MG PO TABS
5.0000 mg | ORAL_TABLET | Freq: Every day | ORAL | 0 refills | Status: DC
Start: 2023-06-01 — End: 2023-09-14
  Filled 2023-06-01: qty 5, 5d supply, fill #0

## 2023-06-01 NOTE — Telephone Encounter (Signed)
Scheduled OV w/patient for 09/13/22 at 2:10 pm with Dr. Leone Payor. Advised to call back sooner with any concerns. Pt verbalized all understanding.

## 2023-06-02 ENCOUNTER — Other Ambulatory Visit: Payer: Self-pay

## 2023-06-02 ENCOUNTER — Encounter (HOSPITAL_COMMUNITY): Payer: Self-pay

## 2023-06-02 ENCOUNTER — Other Ambulatory Visit (HOSPITAL_COMMUNITY): Payer: Self-pay

## 2023-06-02 ENCOUNTER — Encounter: Payer: Self-pay | Admitting: Pharmacist

## 2023-06-03 ENCOUNTER — Other Ambulatory Visit (HOSPITAL_COMMUNITY): Payer: Self-pay

## 2023-06-05 ENCOUNTER — Inpatient Hospital Stay: Payer: PPO

## 2023-06-05 ENCOUNTER — Inpatient Hospital Stay (HOSPITAL_BASED_OUTPATIENT_CLINIC_OR_DEPARTMENT_OTHER): Payer: PPO | Admitting: Hematology

## 2023-06-05 VITALS — BP 115/45 | HR 101 | Temp 97.9°F | Resp 20 | Ht 64.0 in | Wt 212.4 lb

## 2023-06-05 DIAGNOSIS — D801 Nonfamilial hypogammaglobulinemia: Secondary | ICD-10-CM

## 2023-06-05 DIAGNOSIS — C9110 Chronic lymphocytic leukemia of B-cell type not having achieved remission: Secondary | ICD-10-CM

## 2023-06-05 LAB — CBC WITH DIFFERENTIAL (CANCER CENTER ONLY)
Abs Immature Granulocytes: 0.05 10*3/uL (ref 0.00–0.07)
Basophils Absolute: 0.1 10*3/uL (ref 0.0–0.1)
Basophils Relative: 1 %
Eosinophils Absolute: 0.1 10*3/uL (ref 0.0–0.5)
Eosinophils Relative: 1 %
HCT: 31.7 % — ABNORMAL LOW (ref 36.0–46.0)
Hemoglobin: 10 g/dL — ABNORMAL LOW (ref 12.0–15.0)
Immature Granulocytes: 1 %
Lymphocytes Relative: 42 %
Lymphs Abs: 4 10*3/uL (ref 0.7–4.0)
MCH: 29.4 pg (ref 26.0–34.0)
MCHC: 31.5 g/dL (ref 30.0–36.0)
MCV: 93.2 fL (ref 80.0–100.0)
Monocytes Absolute: 1.2 10*3/uL — ABNORMAL HIGH (ref 0.1–1.0)
Monocytes Relative: 12 %
Neutro Abs: 4.3 10*3/uL (ref 1.7–7.7)
Neutrophils Relative %: 43 %
Platelet Count: 100 10*3/uL — ABNORMAL LOW (ref 150–400)
RBC: 3.4 MIL/uL — ABNORMAL LOW (ref 3.87–5.11)
RDW: 18.6 % — ABNORMAL HIGH (ref 11.5–15.5)
Smear Review: DECREASED
WBC Count: 9.6 10*3/uL (ref 4.0–10.5)
nRBC: 0 % (ref 0.0–0.2)

## 2023-06-05 LAB — CMP (CANCER CENTER ONLY)
ALT: 26 U/L (ref 0–44)
AST: 21 U/L (ref 15–41)
Albumin: 4.2 g/dL (ref 3.5–5.0)
Alkaline Phosphatase: 69 U/L (ref 38–126)
Anion gap: 6 (ref 5–15)
BUN: 27 mg/dL — ABNORMAL HIGH (ref 8–23)
CO2: 22 mmol/L (ref 22–32)
Calcium: 9 mg/dL (ref 8.9–10.3)
Chloride: 103 mmol/L (ref 98–111)
Creatinine: 1.07 mg/dL — ABNORMAL HIGH (ref 0.44–1.00)
GFR, Estimated: 55 mL/min — ABNORMAL LOW (ref >60)
Glucose, Bld: 138 mg/dL — ABNORMAL HIGH (ref 70–99)
Potassium: 5.3 mmol/L — ABNORMAL HIGH (ref 3.5–5.1)
Sodium: 131 mmol/L — ABNORMAL LOW (ref 135–145)
Total Bilirubin: 0.5 mg/dL (ref 0.3–1.2)
Total Protein: 6.7 g/dL (ref 6.5–8.1)

## 2023-06-05 LAB — URIC ACID: Uric Acid, Serum: 5.5 mg/dL (ref 2.5–7.1)

## 2023-06-05 LAB — LACTATE DEHYDROGENASE: LDH: 127 U/L (ref 98–192)

## 2023-06-05 NOTE — Progress Notes (Signed)
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  INTERVAL HISTORY:  Janice [name] is a 72 y.o. 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  Patient was last seen by me on 05/22/2023 and she complained of occasional bilateral leg swelling, mildly enlarged lymph near her left neck and near her right armpit, and occasional left shin pain.   Venetoclax 200 mg.   Patient notes she has been doing fairly well since our last visit. She endorsed cough, congestion, and diarrhea around two weeks ago. She went to her Physician and was prescribed antibiotic. Her symptoms improved after taking antibiotics. However. She had many episodes of diarrhea and left leg stiffness this past Saturday. She took imodium, which improved her diarrhea. She denies any episodes of diarrhea yesterday or today.   During this visit, she complains of mild dizziness due to being slightly dehydrated. She notes she has been staying well hydrated, drinking around 64 oz of water everyday.   She started taking Venetoclax 200 mg around 1 week ago. She has been tolerating the 200 mg well with toxicity of diarrhea.   She denies fever, chills, night sweats, bone pain, chest pain, back pain, or leg swelling.   MEDICAL HISTORY:  Past Medical History:  Diagnosis Date   Allergy    Asthma    seasonal   Cellulitis 2013   Left toe   Chronic kidney disease    stage 1   Cirrhosis (HCC)    Common migraine    History of   Complication of anesthesia    Per pt/had breathing problems with "block" during rotator cuff  surgery. 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   Coronary artery disease    DDD (degenerative disc disease)    Depression    denies takes paxil for migraines   Diabetes mellitus    type 2   Diabetic peripheral neuropathy (HCC)    Difficult airway for intubation 10/20/2022   DLx 1 with MAC 4, unable to see cords, DL with lopro S3 glidescope, easy atraumatic intubation   Difficult intubation 03/03/2023   Diverticulitis    Diverticulosis    DJD (degenerative joint disease)    Esophageal varices (HCC)    GERD (gastroesophageal reflux disease)    History of colon polyps    hyperplastic   History of gastric polyp    Hyperlipidemia    Hypertension    Iron deficiency anemia    Obesity    OSA on CPAP    cpap   Peripheral neuropathy    Pneumonia    april 2020  mild   Primary localized osteoarthritis of left knee 03/27/2019   Sensorineural hearing loss    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HCC)     SURGICAL HISTORY: Past Surgical History:  Procedure Laterality Date   ANKLE SURGERY     Left    BREAST BIOPSY Left 2021   left axilla, lymphoma   COLONOSCOPY  05/2018   ESOPHAGOGASTRODUODENOSCOPY (EGD) WITH PROPOFOL N/A 03/09/2023   Procedure: ESOPHAGOGASTRODUODENOSCOPY (EGD) WITH PROPOFOL;  Surgeon: Iva Boop, MD;  Location: [facility];  Service: Gastroenterology;  Laterality: N/A;   LEEP N/A 09/14/2018   Procedure: LOOP  ELECTROSURGICAL EXCISION PROCEDURE (LEEP);  Surgeon: Richardean Chimera, MD;  Location: [facility];  Service: Gynecology;  Laterality: N/A;   MASS EXCISION Right 06/24/2021   Procedure: EXCISION RIGHT POSTERIOR NECK MASS;  Surgeon: Kinsinger, De Blanch, MD;  Location: [facility];  Service: General;  Laterality: Right;   ROTATOR CUFF REPAIR Bilateral 2012, 2015   TONSILLECTOMY     TOTAL KNEE ARTHROPLASTY Left 04/08/2019   Procedure: TOTAL KNEE ARTHROPLASTY;  Surgeon: Salvatore Marvel, MD;  Location: [facility];  Service: Orthopedics;  Laterality: Left;   UMBILICAL  HERNIA REPAIR N/A 10/20/2022   Procedure: OPEN HERNIA REPAIR UMBILICAL ADULT with Mesh;  Surgeon: Kinsinger, De Blanch, MD;  Location: [facility];  Service: General;  Laterality: N/A;   UPPER GI ENDOSCOPY  05/2018    SOCIAL HISTORY: Social History   Socioeconomic History   Marital status: Widowed    Spouse name: Not on file   Number of children: 0   Years of education: 3 years college   Highest education level: Not on file  Occupational History   Occupation: Unemployed  Tobacco Use   Smoking status: Never    Passive exposure: Past   Smokeless tobacco: Never  Vaping Use   Vaping status: Never Used  Substance and Sexual Activity   Alcohol use: Not Currently    Comment: very rare   Drug use: No   Sexual activity: Not Currently    Birth control/protection: None, Post-menopausal  Other Topics Concern   Not on file  Social History Narrative   1 caffeine drink daily    Right-handed   widow (husband died from [place] cirrhosis)   Social Determinants of Health   Financial Resource Strain: Medium Risk (08/20/2020)   Overall Financial Resource Strain (CARDIA)    Difficulty of Paying Living Expenses: Somewhat hard  Food Insecurity: No Food Insecurity (10/20/2022)   Hunger Vital Sign    Worried About Running Out of Food in the Last Year: Never true    Ran Out of Food in the Last Year: Never true  Transportation Needs: No Transportation Needs (10/20/2022)   PRAPARE - Administrator, Civil Service (Medical): No    Lack of Transportation (Non-Medical): No  Physical Activity: Not on file  Stress: Not on file  Social Connections: Unknown (01/25/2022)   [facility] from [facility]   Social Network    Social Network: Not on file  Intimate Partner Violence: Not At Risk (10/20/2022)   Humiliation, Afraid, Rape, and Kick questionnaire    Fear of Current or Ex-Partner: No    Emotionally Abused: No    Physically Abused: No    Sexually Abused: No    FAMILY  HISTORY: Family History  Problem Relation Age of Onset   Breast cancer Mother    Bone cancer Mother    Heart failure Father    Diabetes Mellitus II Father    Hypertension Father    Diabetes Mellitus II Sister    Obesity Sister    Other Sister        retina problem   Hypertension Brother    Diabetes Mellitus II Brother    Colon cancer Neg Hx    Rectal cancer Neg Hx    Stomach cancer Neg Hx    Esophageal cancer Neg Hx    Pancreatic cancer Neg Hx    Liver disease Neg Hx     ALLERGIES:  is allergic to breo ellipta [fluticasone furoate-vilanterol], gazyva [obinutuzumab], iodine, latex, tessalon [benzonatate], zocor [  simvastatin], levaquin [levofloxacin], penicillins, povidone-iodine, wellbutrin [bupropion], amoxicillin, chlorhexidine, diflucan [fluconazole], thalitone [chlorthalidone], amoxicillin-pot clavulanate, and vibra-tab [doxycycline].  MEDICATIONS:  Current Outpatient Medications  Medication Sig Dispense Refill   albuterol (VENTOLIN HFA) 108 (90 Base) MCG/ACT inhaler Inhale 1-2 puffs into the lungs every 6 (six) hours as needed (Cough). 18 g 0   allopurinol (ZYLOPRIM) 100 MG tablet Take 1 tablet (100 mg) by mouth 2 times daily. 60 tablet 1   amLODipine (NORVASC) 10 MG tablet Take 1 tablet (10 mg total) by mouth daily. 90 tablet 3   aspirin EC 81 MG tablet Take 1 tablet (81 mg total) by mouth daily. Swallow whole. 90 tablet 3   azelastine (ASTELIN) 0.1 % nasal spray Place 1 spray into both nostrils 2 (two) times daily. Use in each nostril as directed 9 mL 11   azithromycin (ZITHROMAX Z-PAK) 250 MG tablet Take 2 tablets by mouth on day 1. Then take 1 tablet daily on days 2 - 5.  Take with food. 6 tablet 0   b complex vitamins capsule Take 1 capsule by mouth in the morning.     bacitracin 500 UNIT/GM ointment Apply topically 2 (two) times daily for 10 days 30 g 0   budesonide-formoterol (SYMBICORT) 80-4.5 MCG/ACT inhaler Inhale 2 puffs into the lungs daily. (Patient taking  differently: Inhale 2 puffs into the lungs 2 (two) times daily. Patient usually does 2 puffs in morning and 1 puff in evening.) 10.2 g 12   CALCIUM PO Take 1,000 mg by mouth every morning. 500 mg each     Carboxymethylcellul-Glycerin (REFRESH OPTIVE PF OP) Place 1 drop into both eyes 3 (three) times daily as needed (dry/irritated eyes.). Non-pres     cephALEXin (KEFLEX) 500 MG capsule Take 2 capsules (1,000 mg total) by mouth as directed 1 hour prior to dental work. 20 capsule 0   cetirizine (ZYRTEC) 10 MG tablet Take 5 mg by mouth in the morning.     Coenzyme Q10 (CO Q-10) 100 MG CAPS Take 100 mg by mouth in the morning.     Continuous Glucose Sensor (FREESTYLE LIBRE 2 SENSOR) MISC Use to monitor blood glucose continuously - change every 14 days. 6 each 3   desoximetasone (TOPICORT) 0.25 % cream Apply a small amount to skin twice a day as needed for flares 60 g 0   diclofenac sodium (VOLTAREN) 1 % GEL Apply 2 g topically 2 (two) times daily as needed (knee pain/ foot /Triger finger).  2   empagliflozin (JARDIANCE) 10 MG TABS tablet Take 10 mg by mouth in the morning.     empagliflozin (JARDIANCE) 10 MG TABS tablet Take 1 tablet (10 mg total) by mouth daily. 90 tablet 3   fluticasone (FLONASE) 50 MCG/ACT nasal spray Place 2 sprays into both nostrils in the morning. (Patient taking differently: Place 2 sprays into both nostrils daily as needed for allergies.) 48 g 2   furosemide (LASIX) 20 MG tablet Take 1 tablet (20 mg total) by mouth daily. 90 tablet 3   gabapentin (NEURONTIN) 300 MG capsule Take 1 capsule by mouth twice a day (Patient taking differently: Take 600 mg by mouth at bedtime.) 180 capsule 3   glucose blood test strip Use to test blood sugar 3 times daily as directed 300 each 3   hydrALAZINE (APRESOLINE) 25 MG tablet Take 1 tablet (25 mg total) by mouth 2 (two) times daily. 180 tablet 2   insulin aspart (NOVOLOG FLEXPEN) 100 UNIT/ML FlexPen inject 30 units under the  skin with breakfast,  20 units with lunch, and 10 units with supper (Patient taking differently: Inject 30 Units into the skin with breakfast, with lunch, and with evening meal.) 30 mL 11   insulin aspart (NOVOLOG FLEXPEN) 100 UNIT/ML FlexPen Inject 30 Units into the skin daily with breakfast AND 30 Units daily with lunch AND 30 Units daily with supper. 90 mL 3   insulin degludec (TRESIBA FLEXTOUCH) 200 UNIT/ML FlexTouch Pen Inject 80 Units into the skin daily. 30 mL 11   Insulin Pen Needle (BD PEN NEEDLE NANO 2ND GEN) 32G X 4 MM MISC Use as directed 6 times daily 400 each 5   irbesartan (AVAPRO) 300 MG tablet Take 1 tablet (300 mg total) by mouth in the morning. 90 tablet 3   Lancets (ONETOUCH DELICA PLUS LANCET33G) MISC Use to test blood sugar 3 times daily 300 each 2   LORazepam (ATIVAN) 0.5 MG tablet Take 1-2 tablets (0.5-1 mg total) by mouth once as needed for up to 1 dose (prior to MRI or CT scan for claustrophobia). 4 tablet 0   metFORMIN (GLUCOPHAGE-XR) 500 MG 24 hr tablet Take 2 tablets (1,000 mg total) by mouth 2 (two) times daily. 360 tablet 3   omeprazole (PRILOSEC) 40 MG capsule Take 1 capsule by mouth twice daily 180 capsule 3   ondansetron (ZOFRAN-ODT) 4 MG disintegrating tablet Dissolve 1 tablet (4 mg) by mouth every 8 hours as needed for nausea or vomiting. 20 tablet 0   predniSONE (DELTASONE) 5 MG tablet Take 1 tablet (5 mg total) by mouth daily. 5 tablet 0   rosuvastatin (CRESTOR) 10 MG tablet Take 1 tablet (10 mg total) by mouth daily. (Patient taking differently: Take 10 mg by mouth at bedtime.) 90 tablet 2   sertraline (ZOLOFT) 50 MG tablet Take 1 tablet (50 mg total) by mouth daily. (Patient taking differently: Take 50 mg by mouth every evening.) 90 tablet 3   spironolactone (ALDACTONE) 50 MG tablet Take 1 tablet (50 mg total) by mouth daily. 90 tablet 3   terconazole (TERAZOL 3) 0.8 % vaginal cream Place 1 applicator vaginally daily as needed (yeast infections).     venetoclax (VENCLEXTA) 100 MG  tablet Take 2 tablets (200 mg total) by mouth daily. Tablets should be swallowed whole with a meal and a full glass of water. 56 tablet 3   No current facility-administered medications for this visit.    REVIEW OF SYSTEMS:    10 Point review of Systems was done is negative except as noted above.   PHYSICAL EXAMINATION: .There were no vitals taken for this visit. GENlERAL:alert, in no acute distress and comfortable SKIN: no acute rashes, no significant lesions EYES: conjunctiva are pink and non-injected, sclera anicteric OROPHARYNX: MMM, no exudates, no oropharyngeal erythema or ulceration NECK: supple, no JVD LYMPH:  no palpable lymphadenopathy in the cervical, axillary or inguinal regions LUNGS: clear to auscultation b/l with normal respiratory effort HEART: regular rate & rhythm ABDOMEN:  normoactive bowel sounds , non tender, not distended. Extremity: no pedal edema PSYCH: alert & oriented x 3 with fluent speech NEURO: no focal motor/sensory deficits   LABORATORY DATA:  I have reviewed the data as listed  .    Latest Ref Rng & Units 06/01/2023    8:42 AM 05/25/2023    2:27 PM 05/22/2023   11:51 AM  CBC  WBC 4.0 - 10.5 K/uL 11.2  22.7  34.5   Hemoglobin 12.0 - 15.0 g/dL 9.0  9.9  16.1  Hematocrit 36.0 - 46.0 % 29.5  32.0  33.3   Platelets 150 - 400 K/uL 94  81  98    . CBC    Component Value Date/Time   WBC 11.2 (H) 06/01/2023 0842   WBC 56.9 (HH) 10/20/2022 1148   RBC 3.14 (L) 06/01/2023 0842   HGB 9.0 (L) 06/01/2023 0842   HGB 10.3 (L) 10/18/2021 1222   HCT 29.5 (L) 06/01/2023 0842   HCT 33.0 (L) 10/18/2021 1222   PLT 94 (L) 06/01/2023 0842   PLT 100 (LL) 10/18/2021 1222   MCV 93.9 06/01/2023 0842   MCV 84 10/18/2021 1222   MCH 28.7 06/01/2023 0842   MCHC 30.5 06/01/2023 0842   RDW 19.1 (H) 06/01/2023 0842   RDW 15.1 10/18/2021 1222   LYMPHSABS 6.7 (H) 06/01/2023 0842   MONOABS 0.8 06/01/2023 0842   EOSABS 0.1 06/01/2023 0842   BASOSABS 0.1 06/01/2023  0842     .    Latest Ref Rng & Units 06/01/2023    8:42 AM 05/25/2023    2:27 PM 05/22/2023   11:51 AM  CMP  Glucose 70 - 99 mg/dL 161  096  045   BUN 8 - 23 mg/dL 30  39  35   Creatinine 0.44 - 1.00 mg/dL 4.09  8.11  9.14   Sodium 135 - 145 mmol/L 131  131  133   Potassium 3.5 - 5.1 mmol/L 5.7  5.4  5.4   Chloride 98 - 111 mmol/L 103  102  101   CO2 22 - 32 mmol/L 23  23  26    Calcium 8.9 - 10.3 mg/dL 9.0  9.1  9.6   Total Protein 6.5 - 8.1 g/dL 6.7  7.2  7.5   Total Bilirubin 0.3 - 1.2 mg/dL 0.4  0.5  0.6   Alkaline Phos 38 - 126 U/L 67  64  65   AST 15 - 41 U/L 19  22  21    ALT 0 - 44 U/L 22  23  22     . Lab Results  Component Value Date   LDH 134 06/01/2023   . Lab Results  Component Value Date   IRON 110 05/17/2023   TIBC 449 05/17/2023   IRONPCTSAT 25 05/17/2023   (Iron and TIBC)  Lab Results  Component Value Date   FERRITIN 34 05/17/2023    04/08/2020 FISH/CLL Panel:   03/30/2020 Left Axilla Flow Pathology Report [id number]):   03/30/2020 Left axilla lymph node Bx ([id number]):   RADIOGRAPHIC STUDIES: I have personally reviewed the radiological images as listed and agreed with the findings in the report. No results found.  MRI abdomen with and without contrast 07/08/2021 showed IMPRESSION: 1. Moderate motion and patient body habitus degraded exam. 2. Extensive abdominal adenopathy, similar to slightly increased compared to 05/01/2020 PET. 3. Cirrhosis and hepatosplenomegaly with suspicion of periesophageal varices. 4. Nonspecific hypoenhancing or nonenhancing splenic lesions. These could simply represent Hessie Knows bodies in the setting of cirrhosis. Lymphomatous involvement cannot be excluded. 5. Heterogeneous enhancement throughout the liver, without well-defined suspicious mass. Possibly due to altered perfusion in the setting of cirrhosis and porta hepatis adenopathy. Lymphomatous involvement cannot be excluded. Consider pre and  post-contrast MRI follow-up at 2-3 months. A more aggressive approach would include random liver biopsy.  MRI abdomen with and without contrast 02/04/2022 showed IMPRESSION: 1. Examination is generally limited by breath motion artifact, particularly multiphasic contrast enhanced sequences. Within this limitation, cirrhotic morphology of the liver without focal liver  lesion. 2. 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 3. Trace ascites. 4. Numerous enlarged gastrohepatic ligament, portacaval, retroperitoneal, and small bowel mesenteric lymph nodes are unchanged, in keeping with history of lymphoma.  ASSESSMENT & PLAN:   72 yo female with   1) CLL/SLL -02/28/2020 MM Breast ([id number]) revealed "Bilateral axillary adenopathy." -03/30/2020 Left axilla lymph node biopsy ([id number]) revealed "CHRONIC LYMPHOCYTIC LYMPHOMA/SMALL LYMPHOCYTIC LYMPHOMA".  -03/30/2020 Left Axilla Flow Pathology Report [id number]) revealed "Monoclonal B-cell population with coexpression of CD5 comprises 80% of all lymphocytes". -04/20/20 PET/CT revealed 1. Adenopathy within the neck, chest, abdomen, and pelvis, consistent with active lymphoma. Much of this is not significantly hypermetabolic. Some upper abdominal nodes are moderately hypermetabolic.  2) Mild thrombocytopenia Most likely related to advanced liver cirrhosis and partly could also be from CLL  #3 severe hypogammaglobinemia due to CLL IgG level 272 Component     Latest Ref Rng 09/20/2022  IgG (Immunoglobin G), Serum     586 - 1,602 mg/dL 956 (L)   IgA     64 - 422 mg/dL 15 (L)   IgM (Immunoglobulin M), Srm     26 - 217 mg/dL <5 (L)     Legend: (L) Low  #4 iron deficiency Anemia-- from Epistaxis + ?GI losses. NO overt GI bleeding noted. . Lab Results  Component Value Date   IRON 110 05/17/2023   TIBC 449 05/17/2023   IRONPCTSAT 25  05/17/2023   (Iron and TIBC)  Lab Results  Component Value Date   FERRITIN 34 05/17/2023    PLAN: -Discussed lab results from today, 06/05/2023, in detail with the patient. CBC shows decreased hemoglobin at 10.0 g/dL, decreased hematocrit at 31.7%, and decreased platelets at 100 K. CMP shows elevated glucose at 138, elevated BUN at 27, elevated Creatinine at 1.07, and elevated potassium at 5.3. LDH is improved to 127 and normal uric acid level at 5.5.  -Will cut down Venetoclax to 100 mg starting tomorrow for the next 2 weeks due to recent infection issue and diarrhea.    -Recommend to drink at least 64 ounces of water daily.  -Recommend to eat less potasium rich food.  -Continue vitamin-D supplement 2,000 units.  -Continue IVIG every 4 weeks x 6.  -Recommend influenza vaccine, COVID-19 Booster, and other age related vaccines.   FOLLOW-UP: Continue IVIG every 4 weeks x 6 Cancel current lab appointments. Return to clinic with Dr. Candise Che with labs in 2 weeks  The total time spent in the appointment was *** minutes* .  All of the patient's questions were answered with apparent satisfaction. The patient knows to call the clinic with any problems, questions or concerns.   Wyvonnia Lora MD MS AAHIVMS [facility] Hematology/Oncology Physician [facility]  .*Total Encounter Time as defined by the Centers for Medicare and Medicaid Services includes, in addition to the face-to-face time of a patient visit (documented in the note above) non-face-to-face time: obtaining and reviewing outside history, ordering and reviewing medications, tests or procedures, care coordination (communications with other health care professionals or caregivers) and documentation in the medical record.   I,Param Shah,acting as a Neurosurgeon for Wyvonnia Lora, MD.,have documented all relevant documentation on the behalf of Wyvonnia Lora, MD,as directed by  Wyvonnia Lora, MD while in the presence of Wyvonnia Lora,  MD.

## 2023-06-06 ENCOUNTER — Encounter: Payer: Self-pay | Admitting: Hematology

## 2023-06-08 ENCOUNTER — Other Ambulatory Visit: Payer: PPO

## 2023-06-08 ENCOUNTER — Encounter: Payer: Self-pay | Admitting: Hematology

## 2023-06-09 ENCOUNTER — Other Ambulatory Visit (HOSPITAL_COMMUNITY): Payer: Self-pay

## 2023-06-09 MED ORDER — COVID-19 MRNA VAC-TRIS(PFIZER) 30 MCG/0.3ML IM SUSY
0.3000 mL | PREFILLED_SYRINGE | Freq: Once | INTRAMUSCULAR | 0 refills | Status: AC
  Filled 2023-06-09: qty 0.3, 1d supply, fill #0

## 2023-06-09 MED ORDER — INFLUENZA VAC A&B SURF ANT ADJ 0.5 ML IM SUSY
PREFILLED_SYRINGE | INTRAMUSCULAR | 0 refills | Status: DC
  Filled 2023-06-09: qty 0.5, 1d supply, fill #0

## 2023-06-10 ENCOUNTER — Other Ambulatory Visit (HOSPITAL_COMMUNITY): Payer: Self-pay

## 2023-06-11 ENCOUNTER — Encounter: Payer: Self-pay | Admitting: Hematology

## 2023-06-12 ENCOUNTER — Ambulatory Visit: Payer: PPO | Admitting: Neurology

## 2023-06-12 ENCOUNTER — Encounter: Payer: Self-pay | Admitting: Hematology

## 2023-06-12 ENCOUNTER — Other Ambulatory Visit: Payer: Self-pay

## 2023-06-12 ENCOUNTER — Encounter: Payer: Self-pay | Admitting: Neurology

## 2023-06-12 ENCOUNTER — Other Ambulatory Visit (HOSPITAL_COMMUNITY): Payer: Self-pay

## 2023-06-12 VITALS — BP 107/51 | HR 88 | Ht 64.0 in | Wt 212.4 lb

## 2023-06-12 DIAGNOSIS — C8300 Small cell B-cell lymphoma, unspecified site: Secondary | ICD-10-CM | POA: Diagnosis not present

## 2023-06-12 DIAGNOSIS — I351 Nonrheumatic aortic (valve) insufficiency: Secondary | ICD-10-CM | POA: Diagnosis not present

## 2023-06-12 DIAGNOSIS — D630 Anemia in neoplastic disease: Secondary | ICD-10-CM | POA: Diagnosis not present

## 2023-06-12 DIAGNOSIS — K7581 Nonalcoholic steatohepatitis (NASH): Secondary | ICD-10-CM | POA: Diagnosis not present

## 2023-06-12 DIAGNOSIS — Z794 Long term (current) use of insulin: Secondary | ICD-10-CM | POA: Diagnosis not present

## 2023-06-12 DIAGNOSIS — C9110 Chronic lymphocytic leukemia of B-cell type not having achieved remission: Secondary | ICD-10-CM

## 2023-06-12 DIAGNOSIS — K7460 Unspecified cirrhosis of liver: Secondary | ICD-10-CM | POA: Diagnosis not present

## 2023-06-12 DIAGNOSIS — G4733 Obstructive sleep apnea (adult) (pediatric): Secondary | ICD-10-CM | POA: Diagnosis not present

## 2023-06-12 DIAGNOSIS — E119 Type 2 diabetes mellitus without complications: Secondary | ICD-10-CM | POA: Diagnosis not present

## 2023-06-12 NOTE — Progress Notes (Addendum)
Provider:  Melvyn Novas, MD  Primary Care Physician:  Chilton Greathouse, MD [address] [place] [address]     Referring Provider: Chilton Greathouse, Md [address] [place],  [place] [address]          Chief Complaint according to patient   Patient presents with:     New Patient (Initial Visit)           HISTORY OF PRESENT ILLNESS:  Myrah Minotti is a 72 y.o. female patient who is here for a sleep visit after a hiatus since 2018- she has been actively followed in Video visis by Shawnie Dapper, NP- last 01-18-2022.   Seen today 06/12/2023 for  getting new supplies.   Patient carries a diagnoses of CLL and is followed by oncology, couldn't tolerate the monoclonal ab therapy.  She is on oral chemotherapy, is anemic.  Chief concern according to patient :  "I need to be seen to get CPAP supplies. "  Mrs. Maunu,  who prefers to go by her middle name [name] , endorsed today the Epworth Sleepiness Scale at 6 out of 24 points the fatigue severity score was endorsed at 38 out of 63 points and the geriatric depression score was endorsed at 2 out of 15 points.  She has been highly compliant with CPAP 100% by days and by hours.  The average user time is 7 hours 7 minutes this is an AutoSet on factory settings between 5 and 20 cm water with 3 cm EPR her 95th percentile pressure is 10.7 cm water and her residual AHI is 0.2/h this is an excellent fit no air leakage has been noted her 95th percentile is under 11 cm water and we could actually reduce her maximum pressure.  She uses a nasal mask, the model by [employer] called the Eson ( medium.).   She is in need for a new CPAP and I will order a HST without CPAP use, for the new baseline documentation.       01/18/22 ALL (Mychart): [name] returns for CPAP follow up. She is doing well on therapy. She is using CPAP nightly for about 7 hours. She denies concerns with machine. Her machine is approaching 72 years of age but  works well. She does report mask rubs her nose and can create a sore at times. She uses a circular band aid that helps. She is followed closely by care team.     Chalet Ketcham is a 72 y.o. female , seen here in a RV:  05-25-2017, the patient was issued a new CPAP after a HST confirmed the presence of OSA: She is a compliant user now, but she experienced bronchitis, Pneumonia and constant non- productive cough at night. Spasms of coughing, after 2 antibiotic rounds.  She has been prescribed a narcotic cough syrup with codeine and tried to discontinue this medication but couldn't. Just coughing too much still at night to get restful sleep, just last night took some cough syrup to allow her to sleep. She reports that the CPAP is not pleasant and comfortable to wear while she has sees respiratory tract infections which may partially be viral and partially be allergic in origin. I would like for her to start to take a daily Claritin or Allegra, nondrowsy making to at least eliminate the possibility of allergies playing a role. She gets choked during the day, too. No sleep related, not even food intake related. Epiglottis  spasm?  I will give her the medical leave from CPAP use for the next 7 days until she has completely recovered. She should then start CPAP again. I quote here her last HST -      Severe OSA and snoring as well as hypoxemia still present. Patient qualifies for new CPAP. Order autotitration 5-20 cm water with mask of her choice. Heated humidity. DME :  Please provide a CPAP that can be used as CPAP auto-titrator.  Narrative    Keimora Swartout, Porfirio Mylar, MD     03/10/2017  1:02 PM [address] [place], [place] [address] NAME: Georgett Depa   DOB: 1950/12/22 MEDICAL RECORD [id number]  DOS: 02/23/17 REFERRING PHYSICIAN: Larina Earthly, MD  STUDY PERFORMED: HST HISTORY: OSA. She used to have nocturnal cluster  headaches and morning headaches before CPAP use. The patient  endorsed snoring when  not using CPAP.  Epworth Sleepiness Score :3 points, Fatigue severity score:  17points , and  depression score endorsed at 2/15 points. STUDY RESULTS: Total Recording Time: 7h 80m Total Apnea/Hypopnea Index (AHI): 31.9/ hr.  RDI was 33.8/hr.  Average Oxygen Saturation: 91% Lowest Oxygen Saturation: Nadir SpO2 at 80% with total  desaturation time of 92 minutes.  Average Heart Rate: 65 bpm (57- 65 bpm) IMPRESSION: Severe sleep apnea, obstructive type, associated with  hypoxemia.  RECOMMENDATION: This patient needs to continue CPAP therapy, will  order auto titration 5-20 cm water with heated humidity and mask  of patient's choice.   I certify that I have reviewed the raw data recording prior to  the issuance of this report in accordance with the standards of  the American Academy of Sleep Medicine (AASM). Melvyn Novas, MD    03-09-2017  [facility] Director Diplomat of the ABPN and AASM      Last Resulted: 02/23/17 10:00                Scans on Order [id number]    Scan on 03/20/2017  7:27 AM by Fredrik Cove H : Technical dataScan on 03/20/2017  7:27 AM by Orvilla Cornwall : Technical data           [phone] Revisit  from Dr. Felipa Eth to reestablish sleep medicine care. Mrs. Schwehr was diagnosed with sleep apnea in the year 2006 and last followed with me in the years 2011 or 2012 . She still uses the same CPAP but has had trouble with condensation water accumulating at the interface. She still needs control of allergic rhinitis to allow a patent nasal airway. She did not use sleep aids. She has had Sonata prescribed as a when necessary medication but doesn't use it daily. Other medical problems are insomnia ( controlled on SSRI ) , degenerative joint disease, allergic rhinitis, hypertension, GERD controlled with proton pump inhibitors, fatty liver disease, hyperlipidemia mixed, peripheral neuropathy, insulin-dependent diabetes mellitus, and above-named OSA. She used to have  nocturnal cluster headaches and morning headaches before CPAP use.      Review of Systems: Out of a complete 14 system review, the patient complains of only the following symptoms, and all other reviewed systems are negative.:  Fatigue, sleepiness , snoring, fragmented sleep, Insomnia, RLS, Nocturia    Epworth Sleepiness Scale at 6 out of 24 points the fatigue severity score was endorsed at 38 out of 63 points and the geriatric depression score was endorsed at 2 out of 15 points.   Sitting and Reading? Watching Television? Sitting inactive in a public place (  theater or meeting)? As a passenger in a car for an hour without a break? Lying down in the afternoon when circumstances permit? Sitting and talking to someone? Sitting quietly after lunch without alcohol? In a car, while stopped for a few minutes in traffic?   Epworth Sleepiness Scale at 6 out of 24 points the fatigue severity score was endorsed at 38 out of 63 points and the geriatric depression score was endorsed at 2 out of 15 points. Social History   Socioeconomic History   Marital status: Widowed    Spouse name: Not on file   Number of children: 0   Years of education: 3 years college   Highest education level: Not on file  Occupational History   Occupation: Unemployed  Tobacco Use   Smoking status: Never    Passive exposure: Past   Smokeless tobacco: Never  Vaping Use   Vaping status: Never Used  Substance and Sexual Activity   Alcohol use: Not Currently    Comment: very rare   Drug use: No   Sexual activity: Not Currently    Birth control/protection: None, Post-menopausal  Other Topics Concern   Not on file  Social History Narrative   1 caffeine drink daily    Right-handed   widow (husband died from [place] cirrhosis)   Social Determinants of Health   Financial Resource Strain: Medium Risk (08/20/2020)   Overall Financial Resource Strain (CARDIA)    Difficulty of Paying Living Expenses: Somewhat hard   Food Insecurity: No Food Insecurity (10/20/2022)   Hunger Vital Sign    Worried About Running Out of Food in the Last Year: Never true    Ran Out of Food in the Last Year: Never true  Transportation Needs: No Transportation Needs (10/20/2022)   PRAPARE - Administrator, Civil Service (Medical): No    Lack of Transportation (Non-Medical): No  Physical Activity: Not on file  Stress: Not on file  Social Connections: Unknown (01/25/2022)   [facility] from [facility]   Social Network    Social Network: Not on file    Family History  Problem Relation Age of Onset   Breast cancer Mother    Bone cancer Mother    Heart failure Father    Diabetes Mellitus II Father    Hypertension Father    Diabetes Mellitus II Sister    Obesity Sister    Other Sister        retina problem   Hypertension Brother    Diabetes Mellitus II Brother    Colon cancer Neg Hx    Rectal cancer Neg Hx    Stomach cancer Neg Hx    Esophageal cancer Neg Hx    Pancreatic cancer Neg Hx    Liver disease Neg Hx     Past Medical History:  Diagnosis Date   Allergy    Asthma    seasonal   Cellulitis 2013   Left toe   Chronic kidney disease    stage 1   Cirrhosis (HCC)    Common migraine    History of   Complication of anesthesia    Per pt/had breathing problems with "block" during rotator cuff surgery. 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   Coronary artery disease    DDD (degenerative disc disease)    Depression    denies takes paxil for migraines   Diabetes mellitus    type 2   Diabetic peripheral neuropathy (HCC)    Difficult airway for  intubation 10/20/2022   DLx 1 with MAC 4, unable to see cords, DL with lopro S3 glidescope, easy atraumatic intubation   Difficult intubation 03/03/2023   Diverticulitis    Diverticulosis    DJD (degenerative joint disease)    Esophageal varices (HCC)    GERD (gastroesophageal reflux disease)    History of colon polyps    hyperplastic    History of gastric polyp    Hyperlipidemia    Hypertension    Iron deficiency anemia    Obesity    OSA on CPAP    cpap   Peripheral neuropathy    Pneumonia    april 2020  mild   Primary localized osteoarthritis of left knee 03/27/2019   Sensorineural hearing loss    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HCC)     Past Surgical History:  Procedure Laterality Date   ANKLE SURGERY     Left    BREAST BIOPSY Left 2021   left axilla, lymphoma   COLONOSCOPY  05/2018   ESOPHAGOGASTRODUODENOSCOPY (EGD) WITH PROPOFOL N/A 03/09/2023   Procedure: ESOPHAGOGASTRODUODENOSCOPY (EGD) WITH PROPOFOL;  Surgeon: Iva Boop, MD;  Location: [facility];  Service: Gastroenterology;  Laterality: N/A;   LEEP N/A 09/14/2018   Procedure: LOOP ELECTROSURGICAL EXCISION PROCEDURE (LEEP);  Surgeon: Richardean Chimera, MD;  Location: [facility];  Service: Gynecology;  Laterality: N/A;   MASS EXCISION Right 06/24/2021   Procedure: EXCISION RIGHT POSTERIOR NECK MASS;  Surgeon: Kinsinger, De Blanch, MD;  Location: [facility];  Service: General;  Laterality: Right;   ROTATOR CUFF REPAIR Bilateral 2012, 2015   TONSILLECTOMY     TOTAL KNEE ARTHROPLASTY Left 04/08/2019   Procedure: TOTAL KNEE ARTHROPLASTY;  Surgeon: Salvatore Marvel, MD;  Location: [facility];  Service: Orthopedics;  Laterality: Left;   UMBILICAL HERNIA REPAIR N/A 10/20/2022   Procedure: OPEN HERNIA REPAIR UMBILICAL ADULT with Mesh;  Surgeon: Kinsinger, De Blanch, MD;  Location: [facility];  Service: General;  Laterality: N/A;   UPPER GI ENDOSCOPY  05/2018     Current Outpatient Medications on File Prior to Visit  Medication Sig Dispense Refill   albuterol (VENTOLIN HFA) 108 (90 Base) MCG/ACT inhaler Inhale 1-2 puffs into the lungs every 6 (six) hours as needed (Cough). 18 g 0   allopurinol (ZYLOPRIM) 100 MG tablet Take 1 tablet (100 mg) by mouth 2 times daily. 60 tablet 1   amLODipine (NORVASC) 10 MG tablet Take 1 tablet (10 mg total) by mouth daily. 90  tablet 3   aspirin EC 81 MG tablet Take 1 tablet (81 mg total) by mouth daily. Swallow whole. 90 tablet 3   azelastine (ASTELIN) 0.1 % nasal spray Place 1 spray into both nostrils 2 (two) times daily. Use in each nostril as directed 9 mL 11   azithromycin (ZITHROMAX Z-PAK) 250 MG tablet Take 2 tablets by mouth on day 1. Then take 1 tablet daily on days 2 - 5.  Take with food. 6 tablet 0   b complex vitamins capsule Take 1 capsule by mouth in the morning.     bacitracin 500 UNIT/GM ointment Apply topically 2 (two) times daily for 10 days 30 g 0   budesonide-formoterol (SYMBICORT) 80-4.5 MCG/ACT inhaler Inhale 2 puffs into the lungs daily. (Patient taking differently: Inhale 2 puffs into the lungs 2 (two) times daily. Patient usually does 2 puffs in morning and 1 puff in evening.) 10.2 g 12   CALCIUM PO Take 1,000 mg by mouth every morning. 500 mg each  Carboxymethylcellul-Glycerin (REFRESH OPTIVE PF OP) Place 1 drop into both eyes 3 (three) times daily as needed (dry/irritated eyes.). Non-pres     cephALEXin (KEFLEX) 500 MG capsule Take 2 capsules (1,000 mg total) by mouth as directed 1 hour prior to dental work. 20 capsule 0   cetirizine (ZYRTEC) 10 MG tablet Take 5 mg by mouth in the morning.     Coenzyme Q10 (CO Q-10) 100 MG CAPS Take 100 mg by mouth in the morning.     Continuous Glucose Sensor (FREESTYLE LIBRE 2 SENSOR) MISC Use to monitor blood glucose continuously - change every 14 days. 6 each 3   desoximetasone (TOPICORT) 0.25 % cream Apply a small amount to skin twice a day as needed for flares 60 g 0   diclofenac sodium (VOLTAREN) 1 % GEL Apply 2 g topically 2 (two) times daily as needed (knee pain/ foot /Triger finger).  2   empagliflozin (JARDIANCE) 10 MG TABS tablet Take 10 mg by mouth in the morning.     empagliflozin (JARDIANCE) 10 MG TABS tablet Take 1 tablet (10 mg total) by mouth daily. 90 tablet 3   fluticasone (FLONASE) 50 MCG/ACT nasal spray Place 2 sprays into both nostrils  in the morning. (Patient taking differently: Place 2 sprays into both nostrils daily as needed for allergies.) 48 g 2   furosemide (LASIX) 20 MG tablet Take 1 tablet (20 mg total) by mouth daily. 90 tablet 3   gabapentin (NEURONTIN) 300 MG capsule Take 1 capsule by mouth twice a day (Patient taking differently: Take 600 mg by mouth at bedtime.) 180 capsule 3   glucose blood test strip Use to test blood sugar 3 times daily as directed 300 each 3   hydrALAZINE (APRESOLINE) 25 MG tablet Take 1 tablet (25 mg total) by mouth 2 (two) times daily. 180 tablet 2   influenza vaccine adjuvanted (FLUAD) 0.5 ML injection Inject into the muscle. 0.5 mL 0   insulin aspart (NOVOLOG FLEXPEN) 100 UNIT/ML FlexPen Inject 30 Units into the skin daily with breakfast AND 30 Units daily with lunch AND 30 Units daily with supper. 90 mL 3   insulin degludec (TRESIBA FLEXTOUCH) 200 UNIT/ML FlexTouch Pen Inject 80 Units into the skin daily. 30 mL 11   Insulin Pen Needle (BD PEN NEEDLE NANO 2ND GEN) 32G X 4 MM MISC Use as directed 6 times daily 400 each 5   irbesartan (AVAPRO) 300 MG tablet Take 1 tablet (300 mg total) by mouth in the morning. 90 tablet 3   Lancets (ONETOUCH DELICA PLUS LANCET33G) MISC Use to test blood sugar 3 times daily 300 each 2   LORazepam (ATIVAN) 0.5 MG tablet Take 1-2 tablets (0.5-1 mg total) by mouth once as needed for up to 1 dose (prior to MRI or CT scan for claustrophobia). 4 tablet 0   metFORMIN (GLUCOPHAGE-XR) 500 MG 24 hr tablet Take 2 tablets (1,000 mg total) by mouth 2 (two) times daily. 360 tablet 3   omeprazole (PRILOSEC) 40 MG capsule Take 1 capsule by mouth twice daily 180 capsule 3   ondansetron (ZOFRAN-ODT) 4 MG disintegrating tablet Dissolve 1 tablet (4 mg) by mouth every 8 hours as needed for nausea or vomiting. 20 tablet 0   predniSONE (DELTASONE) 5 MG tablet Take 1 tablet (5 mg total) by mouth daily. 5 tablet 0   rosuvastatin (CRESTOR) 10 MG tablet Take 1 tablet (10 mg total) by mouth  daily. (Patient taking differently: Take 10 mg by mouth at bedtime.) 90  tablet 2   sertraline (ZOLOFT) 50 MG tablet Take 1 tablet (50 mg total) by mouth daily. (Patient taking differently: Take 50 mg by mouth every evening.) 90 tablet 3   spironolactone (ALDACTONE) 50 MG tablet Take 1 tablet (50 mg total) by mouth daily. 90 tablet 3   terconazole (TERAZOL 3) 0.8 % vaginal cream Place 1 applicator vaginally daily as needed (yeast infections).     venetoclax (VENCLEXTA) 100 MG tablet Take 2 tablets (200 mg total) by mouth daily. Tablets should be swallowed whole with a meal and a full glass of water. 56 tablet 3   No current facility-administered medications on file prior to visit.    Allergies  Allergen Reactions   Breo Ellipta [Fluticasone Furoate-Vilanterol] Anaphylaxis and Hives    Other reaction(s): hives  breo ellipta   Gazyva [Obinutuzumab] Shortness Of Breath    04/27/23- SOB, Severe back pain/neck pain, HA, flushing and some diaphoresis; see progress note   Iodine Hives, Swelling and Other (See Comments)    And Betadine/caused hives and swelling   Latex Hives, Itching, Rash and Other (See Comments)    Burning   Tessalon [Benzonatate] Anaphylaxis, Hives and Other (See Comments)   Zocor [Simvastatin] Other (See Comments)    Caused neuropathy in arms Neuropathy in arms   Levaquin [Levofloxacin] Other (See Comments)    Joint aches//leg, shoulder pain Joint pain   Penicillins Hives   Povidone-Iodine Hives   Wellbutrin [Bupropion] Other (See Comments)    Negative thoughts. Crazy thoughts   Amoxicillin Hives    Blisters Did it involve swelling of the face/tongue/throat, SOB, or low BP? Yes Did it involve sudden or severe rash/hives, skin peeling, or any reaction on the inside of your mouth or nose? Yes Did you need to seek medical attention at a hospital or doctor's office?Unknown When did it last happen? 2019      If all above answers are "NO", may proceed with cephalosporin  use.    Chlorhexidine Itching     Chlorhexidine Gluconate (CHG) Cloths/topical solution   Diflucan [Fluconazole] Hives and Other (See Comments)    blister   Thalitone [Chlorthalidone] Other (See Comments)    Ears ringing    Amoxicillin-Pot Clavulanate Rash   Vibra-Tab [Doxycycline] Diarrhea and Itching     DIAGNOSTIC DATA (LABS, IMAGING, TESTING) - I reviewed patient records, labs, notes, testing and imaging myself where available.  Lab Results  Component Value Date   WBC 9.6 06/05/2023   HGB 10.0 (L) 06/05/2023   HCT 31.7 (L) 06/05/2023   MCV 93.2 06/05/2023   PLT 100 (L) 06/05/2023      Component Value Date/Time   NA 131 (L) 06/05/2023 0858   NA 137 10/18/2021 1222   K 5.3 (H) 06/05/2023 0858   CL 103 06/05/2023 0858   CO2 22 06/05/2023 0858   GLUCOSE 138 (H) 06/05/2023 0858   BUN 27 (H) 06/05/2023 0858   BUN 11 10/18/2021 1222   CREATININE 1.07 (H) 06/05/2023 0858   CALCIUM 9.0 06/05/2023 0858   PROT 6.7 06/05/2023 0858   ALBUMIN 4.2 06/05/2023 0858   AST 21 06/05/2023 0858   ALT 26 06/05/2023 0858   ALKPHOS 69 06/05/2023 0858   BILITOT 0.5 06/05/2023 0858   GFRNONAA 55 (L) 06/05/2023 0858   GFRAA >60 04/08/2020 1435   Lab Results  Component Value Date   CHOL 125 10/18/2021   HDL 43 10/18/2021   LDLCALC 65 10/18/2021   TRIG 86 10/18/2021   Lab Results  Component Value Date   HGBA1C 7.5 (H) 10/03/2022   No results found for: "VITAMINB12" Lab Results  Component Value Date   TSH 1.400 10/18/2021    PHYSICAL EXAM:  Today's Vitals   06/12/23 1419  BP: (!) 107/51  Pulse: 88  Weight: 212 lb 6.4 oz (96.3 kg)  Height: 5\' 4"  (1.626 m)   Body mass index is 36.46 kg/m.   Wt Readings from Last 3 Encounters:  06/12/23 212 lb 6.4 oz (96.3 kg)  06/05/23 212 lb 7 oz (96.4 kg)  05/22/23 209 lb 4.8 oz (94.9 kg)     Ht Readings from Last 3 Encounters:  06/12/23 5\' 4"  (1.626 m)  06/05/23 5\' 4"  (1.626 m)  04/06/23 5\' 4"  (1.626 m)      General:  The  patient is awake, alert and appears not in acute distress. The patient is well groomed. Head: Normocephalic, atraumatic. Neck is supple. Mallampati 3,,  neck circumference:19. Nasal airflow congested , TMJ is not  evident . Retrognathia is not seen.  Cardiovascular:  Regular rate and rhythm , with systolic murmurs or carotid bruit, and without distended neck veins. Respiratory: Lungs are clear to auscultation. Skin:  With evidence of edema, ankle edema. Trunk: BMI is morbidly obese .  The patient's posture is erect   Neurologic exam : The patient is awake and alert, oriented to place and time.   Memory subjective described as intact.   Attention span & concentration ability appears normal.  Speech is fluent,  without dysarthria, dysphonia or aphasia.  Mood and affect are appropriate.   Cranial nerves:  Recently reported  change in taste and smell during URI. Pupils are equal and briskly reactive to light. Funduscopic exam without  evidence of pallor or edema.  Extraocular movements  in vertical and horizontal planes intact and without nystagmus. Visual fields by finger perimetry are intact. Hearing to finger rub intact.  Facial sensation intact to fine touch. Facial motor strength is symmetric and tongue and uvula move midline. Shoulder shrug was symmetrical.    Motor exam:   Normal tone, muscle bulk and symmetric strength in all extremities. She has atrophied triceps muscles on both sides.   She has crepitation in both knees when providing ROm. Left more than right.  She reports normal sensation in both feet.   ASSESSMENT AND PLAN 72 y.o. year old female  here with:   CLL< aortic valve  abnormality, edema.  Anemia    1) longstanding OSA on CPAP, due for a new machine, will order a HST .  She is using manufacturer settings, ESON medium mask, but she would like a larger headgear than currently used.   2) CLL Patient with underlying disease related fatigue and anemia   3) still  obese.  New supplies are already delivered, will make sur she gets a new machine by the end of this year and I ordered a HST for a new baseline.  She can follow up with NP Amy Lomax between 60 -89 days on therapy with a new machine.   I plan to follow up through our NP within 2-3 months.   I would like to thank Avva, Ravisankar, MD for allowing me to meet with and to take care of this pleasant patient.    After spending a total time of 35  minutes face to face and additional time for physical and neurologic examination, review of laboratory studies,  personal review of imaging studies, reports and results of other testing and  review of referral information / records as far as provided in visit,   Electronically signed by: Melvyn Novas, MD 06/12/2023 2:52 PM  [facility] and [facility] Sleep Board certified by The [employer] of Sleep Medicine and Diplomate of the [employer] of Sleep Medicine. Board certified In Neurology through the ABPN, Fellow of the [employer] of Neu[employer].

## 2023-06-13 ENCOUNTER — Other Ambulatory Visit (HOSPITAL_COMMUNITY): Payer: Self-pay

## 2023-06-13 ENCOUNTER — Other Ambulatory Visit: Payer: Self-pay

## 2023-06-13 ENCOUNTER — Encounter (HOSPITAL_COMMUNITY): Payer: Self-pay

## 2023-06-13 ENCOUNTER — Encounter: Payer: Self-pay | Admitting: Hematology

## 2023-06-13 ENCOUNTER — Telehealth: Payer: Self-pay | Admitting: Neurology

## 2023-06-13 DIAGNOSIS — C9110 Chronic lymphocytic leukemia of B-cell type not having achieved remission: Secondary | ICD-10-CM

## 2023-06-13 MED ORDER — ROSUVASTATIN CALCIUM 10 MG PO TABS
10.0000 mg | ORAL_TABLET | Freq: Every day | ORAL | 3 refills | Status: DC
  Filled 2023-06-13: qty 90, 90d supply, fill #0
  Filled 2023-09-07: qty 90, 90d supply, fill #1
  Filled 2023-12-09: qty 90, 90d supply, fill #2
  Filled 2024-03-05: qty 90, 90d supply, fill #3

## 2023-06-13 MED ORDER — NOVOLOG FLEXPEN 100 UNIT/ML ~~LOC~~ SOPN
PEN_INJECTOR | SUBCUTANEOUS | 3 refills | Status: AC
  Filled 2023-06-13: qty 45, 90d supply, fill #0

## 2023-06-13 NOTE — Telephone Encounter (Signed)
HST- HTA pending faxed notes.

## 2023-06-14 ENCOUNTER — Other Ambulatory Visit (HOSPITAL_COMMUNITY): Payer: Self-pay

## 2023-06-14 ENCOUNTER — Inpatient Hospital Stay: Payer: PPO

## 2023-06-14 ENCOUNTER — Inpatient Hospital Stay: Payer: PPO | Attending: Hematology

## 2023-06-14 VITALS — BP 124/45 | HR 83 | Temp 98.1°F | Resp 18

## 2023-06-14 DIAGNOSIS — G479 Sleep disorder, unspecified: Secondary | ICD-10-CM | POA: Insufficient documentation

## 2023-06-14 DIAGNOSIS — C9110 Chronic lymphocytic leukemia of B-cell type not having achieved remission: Secondary | ICD-10-CM

## 2023-06-14 DIAGNOSIS — D801 Nonfamilial hypogammaglobulinemia: Secondary | ICD-10-CM | POA: Diagnosis not present

## 2023-06-14 DIAGNOSIS — C8300 Small cell B-cell lymphoma, unspecified site: Secondary | ICD-10-CM

## 2023-06-14 DIAGNOSIS — Z79899 Other long term (current) drug therapy: Secondary | ICD-10-CM | POA: Insufficient documentation

## 2023-06-14 DIAGNOSIS — D696 Thrombocytopenia, unspecified: Secondary | ICD-10-CM | POA: Diagnosis not present

## 2023-06-14 DIAGNOSIS — R252 Cramp and spasm: Secondary | ICD-10-CM | POA: Diagnosis not present

## 2023-06-14 DIAGNOSIS — Z7969 Long term (current) use of other immunomodulators and immunosuppressants: Secondary | ICD-10-CM | POA: Diagnosis not present

## 2023-06-14 DIAGNOSIS — Z7951 Long term (current) use of inhaled steroids: Secondary | ICD-10-CM | POA: Diagnosis not present

## 2023-06-14 DIAGNOSIS — D509 Iron deficiency anemia, unspecified: Secondary | ICD-10-CM | POA: Insufficient documentation

## 2023-06-14 DIAGNOSIS — Z7982 Long term (current) use of aspirin: Secondary | ICD-10-CM | POA: Insufficient documentation

## 2023-06-14 LAB — CMP (CANCER CENTER ONLY)
ALT: 23 U/L (ref 0–44)
AST: 19 U/L (ref 15–41)
Albumin: 4.5 g/dL (ref 3.5–5.0)
Alkaline Phosphatase: 74 U/L (ref 38–126)
Anion gap: 5 (ref 5–15)
BUN: 27 mg/dL — ABNORMAL HIGH (ref 8–23)
CO2: 23 mmol/L (ref 22–32)
Calcium: 9.3 mg/dL (ref 8.9–10.3)
Chloride: 102 mmol/L (ref 98–111)
Creatinine: 1.07 mg/dL — ABNORMAL HIGH (ref 0.44–1.00)
GFR, Estimated: 55 mL/min — ABNORMAL LOW (ref >60)
Glucose, Bld: 114 mg/dL — ABNORMAL HIGH (ref 70–99)
Potassium: 5.7 mmol/L — ABNORMAL HIGH (ref 3.5–5.1)
Sodium: 130 mmol/L — ABNORMAL LOW (ref 135–145)
Total Bilirubin: 0.5 mg/dL (ref 0.3–1.2)
Total Protein: 6.9 g/dL (ref 6.5–8.1)

## 2023-06-14 LAB — CBC WITH DIFFERENTIAL (CANCER CENTER ONLY)
Abs Immature Granulocytes: 0.03 10*3/uL (ref 0.00–0.07)
Basophils Absolute: 0.1 10*3/uL (ref 0.0–0.1)
Basophils Relative: 1 %
Eosinophils Absolute: 0 10*3/uL (ref 0.0–0.5)
Eosinophils Relative: 0 %
HCT: 33.7 % — ABNORMAL LOW (ref 36.0–46.0)
Hemoglobin: 10.3 g/dL — ABNORMAL LOW (ref 12.0–15.0)
Immature Granulocytes: 0 %
Lymphocytes Relative: 37 %
Lymphs Abs: 3 10*3/uL (ref 0.7–4.0)
MCH: 28.1 pg (ref 26.0–34.0)
MCHC: 30.6 g/dL (ref 30.0–36.0)
MCV: 91.8 fL (ref 80.0–100.0)
Monocytes Absolute: 0.8 10*3/uL (ref 0.1–1.0)
Monocytes Relative: 10 %
Neutro Abs: 4.3 10*3/uL (ref 1.7–7.7)
Neutrophils Relative %: 52 %
Platelet Count: 89 10*3/uL — ABNORMAL LOW (ref 150–400)
RBC: 3.67 MIL/uL — ABNORMAL LOW (ref 3.87–5.11)
RDW: 17.2 % — ABNORMAL HIGH (ref 11.5–15.5)
WBC Count: 8.1 10*3/uL (ref 4.0–10.5)
nRBC: 0 % (ref 0.0–0.2)

## 2023-06-14 LAB — URIC ACID: Uric Acid, Serum: 4.8 mg/dL (ref 2.5–7.1)

## 2023-06-14 LAB — LACTATE DEHYDROGENASE: LDH: 138 U/L (ref 98–192)

## 2023-06-14 MED ORDER — ACETAMINOPHEN 325 MG PO TABS
650.0000 mg | ORAL_TABLET | Freq: Once | ORAL | Status: AC
  Administered 2023-06-14: 650 mg via ORAL
  Filled 2023-06-14: qty 2

## 2023-06-14 MED ORDER — DIPHENHYDRAMINE HCL 25 MG PO CAPS
25.0000 mg | ORAL_CAPSULE | Freq: Once | ORAL | Status: AC
  Administered 2023-06-14: 25 mg via ORAL
  Filled 2023-06-14: qty 1

## 2023-06-14 MED ORDER — IMMUNE GLOBULIN (HUMAN) 10 GM/100ML IV SOLN
400.0000 mg/kg | Freq: Once | INTRAVENOUS | Status: AC
  Administered 2023-06-14: 40 g via INTRAVENOUS
  Filled 2023-06-14: qty 400

## 2023-06-14 MED ORDER — METHYLPREDNISOLONE SODIUM SUCC 40 MG IJ SOLR
40.0000 mg | Freq: Once | INTRAMUSCULAR | Status: AC
  Administered 2023-06-14: 40 mg via INTRAVENOUS
  Filled 2023-06-14: qty 1

## 2023-06-14 MED ORDER — DEXTROSE 5 % IV SOLN: INTRAVENOUS | Status: DC

## 2023-06-14 NOTE — Progress Notes (Signed)
Patient declined full 30 minute observation- stayed 5 minutes then went home. VSS-  BP (!) 124/45 (BP Location: Right Arm, Patient Position: Sitting)   Pulse 83   Temp 98.1 F (36.7 C) (Oral)   Resp 18   SpO2 98%   Ambulatory to the lobby.

## 2023-06-14 NOTE — Patient Instructions (Signed)

## 2023-06-15 NOTE — Telephone Encounter (Signed)
HST HTA Berkley Harvey: [id number] (exp. 06/13/23 to 09/11/23)

## 2023-06-16 ENCOUNTER — Other Ambulatory Visit: Payer: Self-pay

## 2023-06-16 DIAGNOSIS — D801 Nonfamilial hypogammaglobulinemia: Secondary | ICD-10-CM

## 2023-06-19 ENCOUNTER — Inpatient Hospital Stay (HOSPITAL_BASED_OUTPATIENT_CLINIC_OR_DEPARTMENT_OTHER): Payer: PPO | Admitting: Hematology

## 2023-06-19 ENCOUNTER — Inpatient Hospital Stay: Payer: PPO

## 2023-06-19 VITALS — BP 118/58 | HR 77 | Temp 98.1°F | Resp 18 | Wt 211.6 lb

## 2023-06-19 DIAGNOSIS — C9110 Chronic lymphocytic leukemia of B-cell type not having achieved remission: Secondary | ICD-10-CM | POA: Diagnosis not present

## 2023-06-19 DIAGNOSIS — D801 Nonfamilial hypogammaglobulinemia: Secondary | ICD-10-CM

## 2023-06-19 LAB — CMP (CANCER CENTER ONLY)
ALT: 26 U/L (ref 0–44)
AST: 20 U/L (ref 15–41)
Albumin: 4.3 g/dL (ref 3.5–5.0)
Alkaline Phosphatase: 68 U/L (ref 38–126)
Anion gap: 7 (ref 5–15)
BUN: 28 mg/dL — ABNORMAL HIGH (ref 8–23)
CO2: 21 mmol/L — ABNORMAL LOW (ref 22–32)
Calcium: 9.2 mg/dL (ref 8.9–10.3)
Chloride: 102 mmol/L (ref 98–111)
Creatinine: 1.11 mg/dL — ABNORMAL HIGH (ref 0.44–1.00)
GFR, Estimated: 53 mL/min — ABNORMAL LOW
Glucose, Bld: 166 mg/dL — ABNORMAL HIGH (ref 70–99)
Potassium: 5 mmol/L (ref 3.5–5.1)
Sodium: 130 mmol/L — ABNORMAL LOW (ref 135–145)
Total Bilirubin: 0.5 mg/dL (ref 0.3–1.2)
Total Protein: 7.2 g/dL (ref 6.5–8.1)

## 2023-06-19 LAB — CBC WITH DIFFERENTIAL (CANCER CENTER ONLY)
Abs Immature Granulocytes: 0.02 10*3/uL (ref 0.00–0.07)
Basophils Absolute: 0 10*3/uL (ref 0.0–0.1)
Basophils Relative: 1 %
Eosinophils Absolute: 0 10*3/uL (ref 0.0–0.5)
Eosinophils Relative: 0 %
HCT: 31.4 % — ABNORMAL LOW (ref 36.0–46.0)
Hemoglobin: 9.8 g/dL — ABNORMAL LOW (ref 12.0–15.0)
Immature Granulocytes: 0 %
Lymphocytes Relative: 29 %
Lymphs Abs: 1.9 10*3/uL (ref 0.7–4.0)
MCH: 28.1 pg (ref 26.0–34.0)
MCHC: 31.2 g/dL (ref 30.0–36.0)
MCV: 90 fL (ref 80.0–100.0)
Monocytes Absolute: 0.8 10*3/uL (ref 0.1–1.0)
Monocytes Relative: 13 %
Neutro Abs: 3.8 10*3/uL (ref 1.7–7.7)
Neutrophils Relative %: 57 %
Platelet Count: 71 10*3/uL — ABNORMAL LOW (ref 150–400)
RBC: 3.49 MIL/uL — ABNORMAL LOW (ref 3.87–5.11)
RDW: 17.1 % — ABNORMAL HIGH (ref 11.5–15.5)
WBC Count: 6.6 10*3/uL (ref 4.0–10.5)
nRBC: 0 % (ref 0.0–0.2)

## 2023-06-19 LAB — LACTATE DEHYDROGENASE: LDH: 126 U/L (ref 98–192)

## 2023-06-19 LAB — URIC ACID: Uric Acid, Serum: 5.4 mg/dL (ref 2.5–7.1)

## 2023-06-19 NOTE — Progress Notes (Signed)
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  INTERVAL HISTORY:  Janice [name] is a 72 y.o. 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  Patient was last seen by me on 06/05/2023 and she complained of mild dizziness due to dehydration and mild grade 1 intermittent loose stools.  Patient is unaccompanied during this visit. She notes she has been doing well overall since our last visit. She denies any new infection issues, fever, chills, night sweats, abdominal pain, chest pain, back pain, abnormal bleeding, SOB, black stools, blood in stools, or leg swelling. She reports of 1 episode of fatigue this past Saturday. She notes that her neck lymph nodes are getting smaller. Patient does complain of bilateral leg cramps.   Patient notes that she has been drinking more water than our last visit.   She has been taking 100 mg Venetoclax and has been tolerating it well without any new or severe toxicities. She notes she has been tolerating her IVIG infusions well without any new or severe toxicities.   Patient notes she has been having memory issues and mild sleep disturbance. She had a sleep study in the past and has been using Cpap machine at home.   She has been taking Gabapentin at night. She is compliant with all of her medications.   MEDICAL HISTORY:  Past Medical History:  Diagnosis Date   Allergy    Asthma    seasonal   Cellulitis 2013   Left toe   Chronic kidney disease    stage 1   Cirrhosis (HCC)    Common migraine    History of   Complication of  anesthesia    Per pt/had breathing problems with "block" during rotator cuff surgery. 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   Coronary artery disease    DDD (degenerative disc disease)    Depression    denies takes paxil for migraines   Diabetes mellitus    type 2   Diabetic peripheral neuropathy (HCC)    Difficult airway for intubation 10/20/2022   DLx 1 with MAC 4, unable to see cords, DL with lopro S3 glidescope, easy atraumatic intubation   Difficult intubation 03/03/2023   Diverticulitis    Diverticulosis    DJD (degenerative joint disease)    Esophageal varices (HCC)    GERD (gastroesophageal reflux disease)    History of colon polyps    hyperplastic   History of gastric polyp    Hyperlipidemia    Hypertension    Iron deficiency anemia    Obesity    OSA on CPAP    cpap   Peripheral neuropathy    Pneumonia    april 2020  mild   Primary localized osteoarthritis of left knee 03/27/2019   Sensorineural hearing loss    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HCC)     SURGICAL HISTORY: Past Surgical History:  Procedure Laterality Date   ANKLE SURGERY     Left    BREAST BIOPSY Left 2021   left axilla, lymphoma   COLONOSCOPY  05/2018   ESOPHAGOGASTRODUODENOSCOPY (EGD) WITH PROPOFOL N/A 03/09/2023   Procedure: ESOPHAGOGASTRODUODENOSCOPY (EGD) WITH PROPOFOL;  Surgeon: Iva Boop, MD;  Location: [facility];  Service: Gastroenterology;  Laterality: N/A;   LEEP N/A 09/14/2018   Procedure: LOOP ELECTROSURGICAL EXCISION PROCEDURE (LEEP);  Surgeon: Richardean Chimera, MD;  Location: [facility];  Service: Gynecology;  Laterality: N/A;   MASS EXCISION Right 06/24/2021   Procedure: EXCISION RIGHT POSTERIOR NECK MASS;  Surgeon: Kinsinger, De Blanch, MD;  Location: [facility];  Service: General;  Laterality: Right;   ROTATOR CUFF REPAIR Bilateral 2012, 2015   TONSILLECTOMY     TOTAL KNEE ARTHROPLASTY Left 04/08/2019   Procedure: TOTAL KNEE ARTHROPLASTY;  Surgeon: Salvatore Marvel,  MD;  Location: [facility];  Service: Orthopedics;  Laterality: Left;   UMBILICAL HERNIA REPAIR N/A 10/20/2022   Procedure: OPEN HERNIA REPAIR UMBILICAL ADULT with Mesh;  Surgeon: Kinsinger, De Blanch, MD;  Location: [facility];  Service: General;  Laterality: N/A;   UPPER GI ENDOSCOPY  05/2018    SOCIAL HISTORY: Social History   Socioeconomic History   Marital status: Widowed    Spouse name: Not on file   Number of children: 0   Years of education: 3 years college   Highest education level: Not on file  Occupational History   Occupation: Unemployed  Tobacco Use   Smoking status: Never    Passive exposure: Past   Smokeless tobacco: Never  Vaping Use   Vaping status: Never Used  Substance and Sexual Activity   Alcohol use: Not Currently    Comment: very rare   Drug use: No   Sexual activity: Not Currently    Birth control/protection: None, Post-menopausal  Other Topics Concern   Not on file  Social History Narrative   1 caffeine drink daily    Right-handed   widow (husband died from [place] cirrhosis)   Social Determinants of Health   Financial Resource Strain: Medium Risk (08/20/2020)   Overall Financial Resource Strain (CARDIA)    Difficulty of Paying Living Expenses: Somewhat hard  Food Insecurity: No Food Insecurity (10/20/2022)   Hunger Vital Sign    Worried About Running Out of Food in the Last Year: Never true    Ran Out of Food in the Last Year: Never true  Transportation Needs: No Transportation Needs (10/20/2022)   PRAPARE - Administrator, Civil Service (Medical): No    Lack of Transportation (Non-Medical): No  Physical Activity: Not on file  Stress: Not on file  Social Connections: Unknown (01/25/2022)   [facility]   Social Network    Social Network: Not on file  Intimate Partner Violence: Not At Risk (10/20/2022)   Humiliation, Afraid, Rape, and Kick questionnaire    Fear of Current or Ex-Partner: No    Emotionally Abused: No     Physically Abused: No    Sexually Abused: No    FAMILY HISTORY: Family History  Problem Relation Age of Onset   Breast cancer Mother    Bone cancer Mother    Heart failure Father    Diabetes Mellitus II Father    Hypertension Father    Diabetes Mellitus II Sister    Obesity Sister    Other Sister        retina problem   Hypertension Brother    Diabetes Mellitus II Brother    Colon cancer Neg Hx    Rectal cancer Neg Hx    Stomach cancer Neg Hx    Esophageal cancer Neg Hx    Pancreatic cancer Neg Hx    Liver disease Neg Hx  ALLERGIES:  is allergic to breo ellipta [fluticasone furoate-vilanterol], gazyva [obinutuzumab], iodine, latex, tessalon [benzonatate], zocor [simvastatin], levaquin [levofloxacin], penicillins, povidone-iodine, wellbutrin [bupropion], amoxicillin, chlorhexidine, diflucan [fluconazole], thalitone [chlorthalidone], amoxicillin-pot clavulanate, and vibra-tab [doxycycline].  MEDICATIONS:  Current Outpatient Medications  Medication Sig Dispense Refill   albuterol (VENTOLIN HFA) 108 (90 Base) MCG/ACT inhaler Inhale 1-2 puffs into the lungs every 6 (six) hours as needed (Cough). 18 g 0   allopurinol (ZYLOPRIM) 100 MG tablet Take 1 tablet (100 mg) by mouth 2 times daily. 60 tablet 1   amLODipine (NORVASC) 10 MG tablet Take 1 tablet (10 mg total) by mouth daily. 90 tablet 3   aspirin EC 81 MG tablet Take 1 tablet (81 mg total) by mouth daily. Swallow whole. 90 tablet 3   azelastine (ASTELIN) 0.1 % nasal spray Place 1 spray into both nostrils 2 (two) times daily. Use in each nostril as directed 9 mL 11   azithromycin (ZITHROMAX Z-PAK) 250 MG tablet Take 2 tablets by mouth on day 1. Then take 1 tablet daily on days 2 - 5.  Take with food. 6 tablet 0   b complex vitamins capsule Take 1 capsule by mouth in the morning.     bacitracin 500 UNIT/GM ointment Apply topically 2 (two) times daily for 10 days 30 g 0   budesonide-formoterol (SYMBICORT) 80-4.5 MCG/ACT  inhaler Inhale 2 puffs into the lungs daily. (Patient taking differently: Inhale 2 puffs into the lungs 2 (two) times daily. Patient usually does 2 puffs in morning and 1 puff in evening.) 10.2 g 12   CALCIUM PO Take 1,000 mg by mouth every morning. 500 mg each     Carboxymethylcellul-Glycerin (REFRESH OPTIVE PF OP) Place 1 drop into both eyes 3 (three) times daily as needed (dry/irritated eyes.). Non-pres     cephALEXin (KEFLEX) 500 MG capsule Take 2 capsules (1,000 mg total) by mouth as directed 1 hour prior to dental work. 20 capsule 0   cetirizine (ZYRTEC) 10 MG tablet Take 5 mg by mouth in the morning.     Coenzyme Q10 (CO Q-10) 100 MG CAPS Take 100 mg by mouth in the morning.     Continuous Glucose Sensor (FREESTYLE LIBRE 2 SENSOR) MISC Use to monitor blood glucose continuously - change every 14 days. 6 each 3   desoximetasone (TOPICORT) 0.25 % cream Apply a small amount to skin twice a day as needed for flares 60 g 0   diclofenac sodium (VOLTAREN) 1 % GEL Apply 2 g topically 2 (two) times daily as needed (knee pain/ foot /Triger finger).  2   empagliflozin (JARDIANCE) 10 MG TABS tablet Take 10 mg by mouth in the morning.     empagliflozin (JARDIANCE) 10 MG TABS tablet Take 1 tablet (10 mg total) by mouth daily. 90 tablet 3   fluticasone (FLONASE) 50 MCG/ACT nasal spray Place 2 sprays into both nostrils in the morning. (Patient taking differently: Place 2 sprays into both nostrils daily as needed for allergies.) 48 g 2   furosemide (LASIX) 20 MG tablet Take 1 tablet (20 mg total) by mouth daily. 90 tablet 3   gabapentin (NEURONTIN) 300 MG capsule Take 1 capsule by mouth twice a day (Patient taking differently: Take 600 mg by mouth at bedtime.) 180 capsule 3   glucose blood test strip Use to test blood sugar 3 times daily as directed 300 each 3   hydrALAZINE (APRESOLINE) 25 MG tablet Take 1 tablet (25 mg total) by mouth 2 (two) times daily. 180  tablet 2   influenza vaccine adjuvanted (FLUAD) 0.5  ML injection Inject into the muscle. 0.5 mL 0   insulin aspart (NOVOLOG FLEXPEN) 100 UNIT/ML FlexPen Inject 30 Units into the skin daily with breakfast AND 30 Units daily with lunch AND 30 Units daily with supper. 90 mL 3   insulin aspart (NOVOLOG FLEXPEN) 100 UNIT/ML FlexPen Inject 30 Units into the skin daily with breakfast AND 30 Units daily with lunch AND 30 Units daily with supper. 45 mL 3   insulin degludec (TRESIBA FLEXTOUCH) 200 UNIT/ML FlexTouch Pen Inject 80 Units into the skin daily. 30 mL 11   Insulin Pen Needle (BD PEN NEEDLE NANO 2ND GEN) 32G X 4 MM MISC Use as directed 6 times daily 400 each 5   irbesartan (AVAPRO) 300 MG tablet Take 1 tablet (300 mg total) by mouth in the morning. 90 tablet 3   Lancets (ONETOUCH DELICA PLUS LANCET33G) MISC Use to test blood sugar 3 times daily 300 each 2   LORazepam (ATIVAN) 0.5 MG tablet Take 1-2 tablets (0.5-1 mg total) by mouth once as needed for up to 1 dose (prior to MRI or CT scan for claustrophobia). 4 tablet 0   metFORMIN (GLUCOPHAGE-XR) 500 MG 24 hr tablet Take 2 tablets (1,000 mg total) by mouth 2 (two) times daily. 360 tablet 3   omeprazole (PRILOSEC) 40 MG capsule Take 1 capsule by mouth twice daily 180 capsule 3   ondansetron (ZOFRAN-ODT) 4 MG disintegrating tablet Dissolve 1 tablet (4 mg) by mouth every 8 hours as needed for nausea or vomiting. 20 tablet 0   predniSONE (DELTASONE) 5 MG tablet Take 1 tablet (5 mg total) by mouth daily. 5 tablet 0   rosuvastatin (CRESTOR) 10 MG tablet Take 1 tablet (10 mg total) by mouth daily. 90 tablet 3   sertraline (ZOLOFT) 50 MG tablet Take 1 tablet (50 mg total) by mouth daily. (Patient taking differently: Take 50 mg by mouth every evening.) 90 tablet 3   spironolactone (ALDACTONE) 50 MG tablet Take 1 tablet (50 mg total) by mouth daily. 90 tablet 3   terconazole (TERAZOL 3) 0.8 % vaginal cream Place 1 applicator vaginally daily as needed (yeast infections).     venetoclax (VENCLEXTA) 100 MG tablet  Take 2 tablets (200 mg total) by mouth daily. Tablets should be swallowed whole with a meal and a full glass of water. 56 tablet 3   No current facility-administered medications for this visit.    REVIEW OF SYSTEMS:    10 Point review of Systems was done is negative except as noted above.   PHYSICAL EXAMINATION: .BP (!) 118/58   Pulse 77   Temp 98.1 F (36.7 C)   Resp 18   Wt 211 lb 9.6 oz (96 kg)   SpO2 98%   BMI 36.32 kg/m  GENlERAL:alert, in no acute distress and comfortable SKIN: no acute rashes, no significant lesions EYES: conjunctiva are pink and non-injected, sclera anicteric OROPHARYNX: MMM, no exudates, no oropharyngeal erythema or ulceration NECK: supple, no JVD LYMPH:  no palpable lymphadenopathy in the cervical, axillary or inguinal regions LUNGS: clear to auscultation b/l with normal respiratory effort HEART: regular rate & rhythm ABDOMEN:  normoactive bowel sounds , non tender, not distended. Extremity: no pedal edema PSYCH: alert & oriented x 3 with fluent speech NEURO: no focal motor/sensory deficits   LABORATORY DATA:  I have reviewed the data as listed  .    Latest Ref Rng & Units 06/19/2023   10:45 AM  06/14/2023   11:55 AM 06/05/2023    8:58 AM  CBC  WBC 4.0 - 10.5 K/uL 6.6  8.1  9.6   Hemoglobin 12.0 - 15.0 g/dL 9.8  16.1  09.6   Hematocrit 36.0 - 46.0 % 31.4  33.7  31.7   Platelets 150 - 400 K/uL 71  89  100    . CBC    Component Value Date/Time   WBC 6.6 06/19/2023 1045   WBC 56.9 (HH) 10/20/2022 1148   RBC 3.49 (L) 06/19/2023 1045   HGB 9.8 (L) 06/19/2023 1045   HGB 10.3 (L) 10/18/2021 1222   HCT 31.4 (L) 06/19/2023 1045   HCT 33.0 (L) 10/18/2021 1222   PLT 71 (L) 06/19/2023 1045   PLT 100 (LL) 10/18/2021 1222   MCV 90.0 06/19/2023 1045   MCV 84 10/18/2021 1222   MCH 28.1 06/19/2023 1045   MCHC 31.2 06/19/2023 1045   RDW 17.1 (H) 06/19/2023 1045   RDW 15.1 10/18/2021 1222   LYMPHSABS 1.9 06/19/2023 1045   MONOABS 0.8 06/19/2023  1045   EOSABS 0.0 06/19/2023 1045   BASOSABS 0.0 06/19/2023 1045     .    Latest Ref Rng & Units 06/19/2023   10:45 AM 06/14/2023   11:55 AM 06/05/2023    8:58 AM  CMP  Glucose 70 - 99 mg/dL 045  409  811   BUN 8 - 23 mg/dL 28  27  27    Creatinine 0.44 - 1.00 mg/dL 9.14  7.82  9.56   Sodium 135 - 145 mmol/L 130  130  131   Potassium 3.5 - 5.1 mmol/L 5.0  5.7  5.3   Chloride 98 - 111 mmol/L 102  102  103   CO2 22 - 32 mmol/L 21  23  22    Calcium 8.9 - 10.3 mg/dL 9.2  9.3  9.0   Total Protein 6.5 - 8.1 g/dL 7.2  6.9  6.7   Total Bilirubin 0.3 - 1.2 mg/dL 0.5  0.5  0.5   Alkaline Phos 38 - 126 U/L 68  74  69   AST 15 - 41 U/L 20  19  21    ALT 0 - 44 U/L 26  23  26     . Lab Results  Component Value Date   LDH 126 06/19/2023   . Lab Results  Component Value Date   IRON 110 05/17/2023   TIBC 449 05/17/2023   IRONPCTSAT 25 05/17/2023   (Iron and TIBC)  Lab Results  Component Value Date   FERRITIN 34 05/17/2023    04/08/2020 FISH/CLL Panel:   03/30/2020 Left Axilla Flow Pathology Report [id number]):   03/30/2020 Left axilla lymph node Bx ([id number]):   RADIOGRAPHIC STUDIES: I have personally reviewed the radiological images as listed and agreed with the findings in the report. No results found.  MRI abdomen with and without contrast 07/08/2021 showed IMPRESSION: 1. Moderate motion and patient body habitus degraded exam. 2. Extensive abdominal adenopathy, similar to slightly increased compared to 05/01/2020 PET. 3. Cirrhosis and hepatosplenomegaly with suspicion of periesophageal varices. 4. Nonspecific hypoenhancing or nonenhancing splenic lesions. These could simply represent Hessie Knows bodies in the setting of cirrhosis. Lymphomatous involvement cannot be excluded. 5. Heterogeneous enhancement throughout the liver, without well-defined suspicious mass. Possibly due to altered perfusion in the setting of cirrhosis and porta hepatis adenopathy.  Lymphomatous involvement cannot be excluded. Consider pre and post-contrast MRI follow-up at 2-3 months. A more aggressive approach would include random liver biopsy.  MRI abdomen with and without contrast 02/04/2022 showed IMPRESSION: 1. Examination is generally limited by breath motion artifact, particularly multiphasic contrast enhanced sequences. Within this limitation, cirrhotic morphology of the liver without focal liver lesion. 2. 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 3. Trace ascites. 4. Numerous enlarged gastrohepatic ligament, portacaval, retroperitoneal, and small bowel mesenteric lymph nodes are unchanged, in keeping with history of lymphoma.  ASSESSMENT & PLAN:   72 yo female with   1) CLL/SLL -02/28/2020 MM Breast ([id number]) revealed "Bilateral axillary adenopathy." -03/30/2020 Left axilla lymph node biopsy ([id number]) revealed "CHRONIC LYMPHOCYTIC LYMPHOMA/SMALL LYMPHOCYTIC LYMPHOMA".  -03/30/2020 Left Axilla Flow Pathology Report [id number]) revealed "Monoclonal B-cell population with coexpression of CD5 comprises 80% of all lymphocytes". -04/20/20 PET/CT revealed 1. Adenopathy within the neck, chest, abdomen, and pelvis, consistent with active lymphoma. Much of this is not significantly hypermetabolic. Some upper abdominal nodes are moderately hypermetabolic.  2) Mild thrombocytopenia Most likely related to advanced liver cirrhosis and partly could also be from CLL  #3 severe hypogammaglobinemia due to CLL IgG level 272 Component     Latest Ref Rng 09/20/2022  IgG (Immunoglobin G), Serum     586 - 1,602 mg/dL 027 (L)   IgA     64 - 422 mg/dL 15 (L)   IgM (Immunoglobulin M), Srm     26 - 217 mg/dL <5 (L)     Legend: (L) Low  #4 iron deficiency Anemia-- from Epistaxis + ?GI losses. NO overt GI bleeding noted. . Lab Results  Component Value Date    IRON 110 05/17/2023   TIBC 449 05/17/2023   IRONPCTSAT 25 05/17/2023   (Iron and TIBC)  Lab Results  Component Value Date   FERRITIN 34 05/17/2023    PLAN: -Discussed lab results from today, 06/19/2023, in detail with the patient. CBC shows WBC in the normal range, decreased hemoglobin at 9.8 g/dL, decreased hematocrit of 31.4%, and decreased platelets at 71 K.  -Continue 100 mg Venetoclax for now.  -Discussed with the patient that we might increase Venetoclax dosage to 200 mg during next visit.  -Recommend to drink at least 64 ounces of water daily.  -Continue vitamin-D supplement 2,000 units.  -Continue IVIG every 4 weeks x 6.   -Answered all of patient's questions.   FOLLOW-UP: Plz add MD visit with appointments for labs and IVIG on 10/31.   The total time spent in the appointment was 30 minutes* .  All of the patient's questions were answered with apparent satisfaction. The patient knows to call the clinic with any problems, questions or concerns.   Wyvonnia Lora MD MS AAHIVMS [facility] Hematology/Oncology Physician [facility]  .*Total Encounter Time as defined by the Centers for Medicare and Medicaid Services includes, in addition to the face-to-face time of a patient visit (documented in the note above) non-face-to-face time: obtaining and reviewing outside history, ordering and reviewing medications, tests or procedures, care coordination (communications with other health care professionals or caregivers) and documentation in the medical record.   I,Param Shah,acting as a Neurosurgeon for Wyvonnia Lora, MD.,have documented all relevant documentation on the behalf of Wyvonnia Lora, MD,as directed by  Wyvonnia Lora, MD while in the presence of Wyvonnia Lora, MD.  .I have reviewed the above documentation for accuracy and completeness, and I agree with the above. Johney Maine MD

## 2023-06-21 ENCOUNTER — Other Ambulatory Visit (HOSPITAL_COMMUNITY): Payer: Self-pay

## 2023-06-21 ENCOUNTER — Other Ambulatory Visit: Payer: Self-pay

## 2023-06-21 NOTE — Progress Notes (Signed)
Specialty Pharmacy Refill Coordination Note  Janice [name] is a 72 y.o. female contacted today regarding refills of specialty medication(s) Venclexta. Pt reported decreasing dose from 200mg  to 100mg  every day due to side effects. Prescriber is aware per patient. Pt reports 38 tablets on hand. Pharmacy will follow up with patient in coordination with next refill and office visit.

## 2023-06-22 ENCOUNTER — Other Ambulatory Visit: Payer: Self-pay | Admitting: Hematology

## 2023-06-22 ENCOUNTER — Other Ambulatory Visit (HOSPITAL_BASED_OUTPATIENT_CLINIC_OR_DEPARTMENT_OTHER): Payer: Self-pay

## 2023-06-22 ENCOUNTER — Other Ambulatory Visit: Payer: PPO

## 2023-06-22 ENCOUNTER — Other Ambulatory Visit: Payer: Self-pay

## 2023-06-22 ENCOUNTER — Other Ambulatory Visit (HOSPITAL_COMMUNITY): Payer: Self-pay

## 2023-06-22 MED ORDER — VICKS VAPORUB CHILDRENS 4.8-1.2-2.6 % EX OINT: TOPICAL_OINTMENT | CUTANEOUS | 0 refills | Status: DC

## 2023-06-22 MED ORDER — AZITHROMYCIN 250 MG PO TABS
ORAL_TABLET | ORAL | 0 refills | Status: DC
  Filled 2023-06-22: qty 6, 5d supply, fill #0

## 2023-06-22 MED ORDER — ALLOPURINOL 100 MG PO TABS
100.0000 mg | ORAL_TABLET | Freq: Two times a day (BID) | ORAL | 1 refills | Status: DC
  Filled 2023-06-22: qty 60, 30d supply, fill #0
  Filled 2023-07-18: qty 60, 30d supply, fill #1

## 2023-06-23 ENCOUNTER — Other Ambulatory Visit: Payer: Self-pay

## 2023-06-23 ENCOUNTER — Other Ambulatory Visit (HOSPITAL_COMMUNITY): Payer: Self-pay

## 2023-06-25 ENCOUNTER — Encounter: Payer: Self-pay | Admitting: Hematology

## 2023-06-26 ENCOUNTER — Other Ambulatory Visit: Payer: PPO

## 2023-06-29 ENCOUNTER — Other Ambulatory Visit: Payer: PPO

## 2023-06-30 ENCOUNTER — Other Ambulatory Visit (HOSPITAL_COMMUNITY): Payer: Self-pay

## 2023-06-30 DIAGNOSIS — B349 Viral infection, unspecified: Secondary | ICD-10-CM | POA: Diagnosis not present

## 2023-06-30 DIAGNOSIS — R051 Acute cough: Secondary | ICD-10-CM | POA: Diagnosis not present

## 2023-06-30 MED ORDER — AZITHROMYCIN 250 MG PO TABS
ORAL_TABLET | ORAL | 0 refills | Status: AC
  Filled 2023-06-30: qty 6, 5d supply, fill #0

## 2023-07-03 ENCOUNTER — Other Ambulatory Visit: Payer: PPO

## 2023-07-06 ENCOUNTER — Other Ambulatory Visit: Payer: Self-pay

## 2023-07-06 ENCOUNTER — Other Ambulatory Visit: Payer: Self-pay | Admitting: Hematology and Oncology

## 2023-07-06 ENCOUNTER — Other Ambulatory Visit (HOSPITAL_COMMUNITY): Payer: Self-pay

## 2023-07-06 ENCOUNTER — Other Ambulatory Visit: Payer: PPO

## 2023-07-06 DIAGNOSIS — D500 Iron deficiency anemia secondary to blood loss (chronic): Secondary | ICD-10-CM

## 2023-07-06 DIAGNOSIS — C9110 Chronic lymphocytic leukemia of B-cell type not having achieved remission: Secondary | ICD-10-CM

## 2023-07-06 DIAGNOSIS — C8300 Small cell B-cell lymphoma, unspecified site: Secondary | ICD-10-CM

## 2023-07-06 DIAGNOSIS — R110 Nausea: Secondary | ICD-10-CM

## 2023-07-06 MED ORDER — ONDANSETRON 4 MG PO TBDP
4.0000 mg | ORAL_TABLET | Freq: Three times a day (TID) | ORAL | 0 refills | Status: DC | PRN
  Filled 2023-07-06: qty 20, 7d supply, fill #0

## 2023-07-10 ENCOUNTER — Other Ambulatory Visit: Payer: Self-pay

## 2023-07-12 ENCOUNTER — Other Ambulatory Visit: Payer: Self-pay

## 2023-07-12 ENCOUNTER — Encounter (INDEPENDENT_AMBULATORY_CARE_PROVIDER_SITE_OTHER): Payer: PPO | Admitting: Ophthalmology

## 2023-07-12 DIAGNOSIS — I10 Essential (primary) hypertension: Secondary | ICD-10-CM

## 2023-07-12 DIAGNOSIS — H35033 Hypertensive retinopathy, bilateral: Secondary | ICD-10-CM | POA: Diagnosis not present

## 2023-07-12 DIAGNOSIS — C9110 Chronic lymphocytic leukemia of B-cell type not having achieved remission: Secondary | ICD-10-CM

## 2023-07-12 DIAGNOSIS — E113393 Type 2 diabetes mellitus with moderate nonproliferative diabetic retinopathy without macular edema, bilateral: Secondary | ICD-10-CM

## 2023-07-12 DIAGNOSIS — Z7984 Long term (current) use of oral hypoglycemic drugs: Secondary | ICD-10-CM | POA: Diagnosis not present

## 2023-07-12 DIAGNOSIS — H43813 Vitreous degeneration, bilateral: Secondary | ICD-10-CM

## 2023-07-12 DIAGNOSIS — Z794 Long term (current) use of insulin: Secondary | ICD-10-CM | POA: Diagnosis not present

## 2023-07-13 ENCOUNTER — Inpatient Hospital Stay: Payer: PPO

## 2023-07-13 ENCOUNTER — Other Ambulatory Visit: Payer: Self-pay

## 2023-07-13 VITALS — BP 98/66 | HR 69 | Temp 98.1°F | Resp 18 | Wt 213.0 lb

## 2023-07-13 DIAGNOSIS — D801 Nonfamilial hypogammaglobulinemia: Secondary | ICD-10-CM

## 2023-07-13 DIAGNOSIS — C9110 Chronic lymphocytic leukemia of B-cell type not having achieved remission: Secondary | ICD-10-CM | POA: Diagnosis not present

## 2023-07-13 DIAGNOSIS — C8300 Small cell B-cell lymphoma, unspecified site: Secondary | ICD-10-CM

## 2023-07-13 LAB — CBC WITH DIFFERENTIAL (CANCER CENTER ONLY)
Abs Immature Granulocytes: 0.03 10*3/uL (ref 0.00–0.07)
Basophils Absolute: 0.1 10*3/uL (ref 0.0–0.1)
Basophils Relative: 1 %
Eosinophils Absolute: 0 10*3/uL (ref 0.0–0.5)
Eosinophils Relative: 1 %
HCT: 32.3 % — ABNORMAL LOW (ref 36.0–46.0)
Hemoglobin: 10.3 g/dL — ABNORMAL LOW (ref 12.0–15.0)
Immature Granulocytes: 1 %
Lymphocytes Relative: 26 %
Lymphs Abs: 1.4 10*3/uL (ref 0.7–4.0)
MCH: 27.5 pg (ref 26.0–34.0)
MCHC: 31.9 g/dL (ref 30.0–36.0)
MCV: 86.1 fL (ref 80.0–100.0)
Monocytes Absolute: 0.9 10*3/uL (ref 0.1–1.0)
Monocytes Relative: 16 %
Neutro Abs: 3.1 10*3/uL (ref 1.7–7.7)
Neutrophils Relative %: 55 %
Platelet Count: 73 10*3/uL — ABNORMAL LOW (ref 150–400)
RBC: 3.75 MIL/uL — ABNORMAL LOW (ref 3.87–5.11)
RDW: 15.6 % — ABNORMAL HIGH (ref 11.5–15.5)
WBC Count: 5.5 10*3/uL (ref 4.0–10.5)
nRBC: 0 % (ref 0.0–0.2)

## 2023-07-13 LAB — CMP (CANCER CENTER ONLY)
ALT: 26 U/L (ref 0–44)
AST: 22 U/L (ref 15–41)
Albumin: 4.3 g/dL (ref 3.5–5.0)
Alkaline Phosphatase: 79 U/L (ref 38–126)
Anion gap: 6 (ref 5–15)
BUN: 22 mg/dL (ref 8–23)
CO2: 24 mmol/L (ref 22–32)
Calcium: 9.1 mg/dL (ref 8.9–10.3)
Chloride: 105 mmol/L (ref 98–111)
Creatinine: 1 mg/dL (ref 0.44–1.00)
GFR, Estimated: 60 mL/min — ABNORMAL LOW (ref >60)
Glucose, Bld: 103 mg/dL — ABNORMAL HIGH (ref 70–99)
Potassium: 4.7 mmol/L (ref 3.5–5.1)
Sodium: 135 mmol/L (ref 135–145)
Total Bilirubin: 0.5 mg/dL (ref 0.3–1.2)
Total Protein: 6.7 g/dL (ref 6.5–8.1)

## 2023-07-13 LAB — LACTATE DEHYDROGENASE: LDH: 152 U/L (ref 98–192)

## 2023-07-13 LAB — URIC ACID: Uric Acid, Serum: 4.9 mg/dL (ref 2.5–7.1)

## 2023-07-13 MED ORDER — METHYLPREDNISOLONE SODIUM SUCC 40 MG IJ SOLR
40.0000 mg | Freq: Every day | INTRAMUSCULAR | Status: DC
  Administered 2023-07-13: 40 mg via INTRAVENOUS
  Filled 2023-07-13: qty 1

## 2023-07-13 MED ORDER — DIPHENHYDRAMINE HCL 25 MG PO CAPS
25.0000 mg | ORAL_CAPSULE | Freq: Once | ORAL | Status: AC
Start: 2023-07-13 — End: 2023-07-13
  Administered 2023-07-13: 25 mg via ORAL
  Filled 2023-07-13: qty 1

## 2023-07-13 MED ORDER — DEXTROSE 5 % IV SOLN: INTRAVENOUS | Status: DC

## 2023-07-13 MED ORDER — ACETAMINOPHEN 325 MG PO TABS
650.0000 mg | ORAL_TABLET | Freq: Once | ORAL | Status: AC
Start: 2023-07-13 — End: 2023-07-13
  Administered 2023-07-13: 650 mg via ORAL
  Filled 2023-07-13: qty 2

## 2023-07-13 MED ORDER — IMMUNE GLOBULIN (HUMAN) 10 GM/100ML IV SOLN
400.0000 mg/kg | INTRAVENOUS | Status: DC
  Administered 2023-07-13: 40 g via INTRAVENOUS
  Filled 2023-07-13: qty 400

## 2023-07-13 NOTE — Patient Instructions (Signed)

## 2023-07-14 ENCOUNTER — Encounter: Payer: Self-pay | Admitting: Hematology

## 2023-07-14 ENCOUNTER — Inpatient Hospital Stay: Payer: PPO | Attending: Hematology | Admitting: Hematology

## 2023-07-14 DIAGNOSIS — I10 Essential (primary) hypertension: Secondary | ICD-10-CM | POA: Diagnosis not present

## 2023-07-14 DIAGNOSIS — E119 Type 2 diabetes mellitus without complications: Secondary | ICD-10-CM | POA: Diagnosis not present

## 2023-07-14 DIAGNOSIS — Z1389 Encounter for screening for other disorder: Secondary | ICD-10-CM | POA: Diagnosis not present

## 2023-07-14 DIAGNOSIS — D509 Iron deficiency anemia, unspecified: Secondary | ICD-10-CM | POA: Diagnosis not present

## 2023-07-14 DIAGNOSIS — Z1212 Encounter for screening for malignant neoplasm of rectum: Secondary | ICD-10-CM | POA: Diagnosis not present

## 2023-07-17 ENCOUNTER — Other Ambulatory Visit: Payer: PPO

## 2023-07-18 ENCOUNTER — Other Ambulatory Visit (HOSPITAL_COMMUNITY): Payer: Self-pay

## 2023-07-20 ENCOUNTER — Encounter: Payer: Self-pay | Admitting: Hematology

## 2023-07-20 ENCOUNTER — Other Ambulatory Visit: Payer: PPO

## 2023-07-20 NOTE — Progress Notes (Signed)
This encounter was created in error - please disregard.

## 2023-07-21 ENCOUNTER — Encounter: Payer: Self-pay | Admitting: Hematology

## 2023-07-21 ENCOUNTER — Other Ambulatory Visit: Payer: Self-pay

## 2023-07-21 ENCOUNTER — Other Ambulatory Visit (HOSPITAL_COMMUNITY): Payer: Self-pay

## 2023-07-21 DIAGNOSIS — E1169 Type 2 diabetes mellitus with other specified complication: Secondary | ICD-10-CM | POA: Diagnosis not present

## 2023-07-21 DIAGNOSIS — I10 Essential (primary) hypertension: Secondary | ICD-10-CM | POA: Diagnosis not present

## 2023-07-21 DIAGNOSIS — I5189 Other ill-defined heart diseases: Secondary | ICD-10-CM | POA: Diagnosis not present

## 2023-07-21 DIAGNOSIS — D801 Nonfamilial hypogammaglobulinemia: Secondary | ICD-10-CM | POA: Diagnosis not present

## 2023-07-21 DIAGNOSIS — Z0000 Encounter for general adult medical examination without abnormal findings: Secondary | ICD-10-CM | POA: Diagnosis not present

## 2023-07-21 DIAGNOSIS — K7460 Unspecified cirrhosis of liver: Secondary | ICD-10-CM | POA: Diagnosis not present

## 2023-07-21 DIAGNOSIS — C8300 Small cell B-cell lymphoma, unspecified site: Secondary | ICD-10-CM | POA: Diagnosis not present

## 2023-07-21 DIAGNOSIS — Z1339 Encounter for screening examination for other mental health and behavioral disorders: Secondary | ICD-10-CM | POA: Diagnosis not present

## 2023-07-21 DIAGNOSIS — R413 Other amnesia: Secondary | ICD-10-CM | POA: Diagnosis not present

## 2023-07-21 DIAGNOSIS — E785 Hyperlipidemia, unspecified: Secondary | ICD-10-CM | POA: Diagnosis not present

## 2023-07-21 DIAGNOSIS — E1140 Type 2 diabetes mellitus with diabetic neuropathy, unspecified: Secondary | ICD-10-CM | POA: Diagnosis not present

## 2023-07-21 DIAGNOSIS — R188 Other ascites: Secondary | ICD-10-CM | POA: Diagnosis not present

## 2023-07-21 DIAGNOSIS — C9110 Chronic lymphocytic leukemia of B-cell type not having achieved remission: Secondary | ICD-10-CM

## 2023-07-21 DIAGNOSIS — G629 Polyneuropathy, unspecified: Secondary | ICD-10-CM | POA: Diagnosis not present

## 2023-07-21 DIAGNOSIS — Z13828 Encounter for screening for other musculoskeletal disorder: Secondary | ICD-10-CM | POA: Diagnosis not present

## 2023-07-21 DIAGNOSIS — Z1331 Encounter for screening for depression: Secondary | ICD-10-CM | POA: Diagnosis not present

## 2023-07-21 MED ORDER — VENETOCLAX 100 MG PO TABS
200.0000 mg | ORAL_TABLET | Freq: Every day | ORAL | 3 refills | Status: DC
  Filled 2023-07-24: qty 56, 28d supply, fill #0

## 2023-07-24 ENCOUNTER — Other Ambulatory Visit: Payer: PPO

## 2023-07-24 ENCOUNTER — Other Ambulatory Visit (HOSPITAL_COMMUNITY): Payer: Self-pay

## 2023-07-24 ENCOUNTER — Encounter: Payer: Self-pay | Admitting: Hematology

## 2023-07-24 ENCOUNTER — Telehealth: Payer: Self-pay | Admitting: Hematology

## 2023-07-24 ENCOUNTER — Other Ambulatory Visit: Payer: Self-pay

## 2023-07-24 MED ORDER — DICLOFENAC SODIUM 1 % EX GEL
1.0000 | Freq: Four times a day (QID) | CUTANEOUS | 5 refills | Status: AC
  Filled 2023-07-24: qty 200, 13d supply, fill #0
  Filled 2023-08-07: qty 200, 90d supply, fill #0
  Filled 2023-10-12: qty 200, 25d supply, fill #0
  Filled 2024-04-02: qty 200, 25d supply, fill #1

## 2023-07-24 NOTE — Progress Notes (Signed)
Specialty Pharmacy Ongoing Clinical Assessment Note  Janice [name] is a 72 y.o. female who is being followed by the specialty pharmacy service for RxSp Oncology   Patient's specialty medication(s) reviewed today: Venetoclax   Missed doses in the last 4 weeks: 0   Patient/Caregiver did not have any additional questions or concerns.   Therapeutic benefit summary: Patient is achieving benefit   Adverse events/side effects summary: Experienced adverse events/side effects (fatigue and confusion, tolerable; diarrhea has resolved at the lower dose)   Patient's therapy is appropriate to: Continue    Goals Addressed             This Visit's Progress    Achieve or maintain remission       Patient is not on track and no change. Patient will maintain adherence         Follow up:  3 months  Servando Snare Specialty Pharmacist

## 2023-07-24 NOTE — Progress Notes (Signed)
Specialty Pharmacy Refill Coordination Note  Janice [name] is a 72 y.o. female contacted today regarding refills of specialty medication(s) Venetoclax   Patient requested Delivery   Delivery date: 07/25/23   Verified address: [id number]  [place] [id number]   Medication will be filled on 07/24/23.

## 2023-07-25 ENCOUNTER — Other Ambulatory Visit (HOSPITAL_COMMUNITY): Payer: Self-pay

## 2023-07-25 ENCOUNTER — Telehealth: Payer: Self-pay | Admitting: Pharmacy Technician

## 2023-07-25 DIAGNOSIS — K760 Fatty (change of) liver, not elsewhere classified: Secondary | ICD-10-CM | POA: Diagnosis not present

## 2023-07-25 DIAGNOSIS — Z794 Long term (current) use of insulin: Secondary | ICD-10-CM | POA: Diagnosis not present

## 2023-07-25 DIAGNOSIS — I129 Hypertensive chronic kidney disease with stage 1 through stage 4 chronic kidney disease, or unspecified chronic kidney disease: Secondary | ICD-10-CM | POA: Diagnosis not present

## 2023-07-25 DIAGNOSIS — N182 Chronic kidney disease, stage 2 (mild): Secondary | ICD-10-CM | POA: Diagnosis not present

## 2023-07-25 DIAGNOSIS — E119 Type 2 diabetes mellitus without complications: Secondary | ICD-10-CM | POA: Diagnosis not present

## 2023-07-25 DIAGNOSIS — E782 Mixed hyperlipidemia: Secondary | ICD-10-CM | POA: Diagnosis not present

## 2023-07-25 NOTE — Telephone Encounter (Signed)
Pharmacy Patient Advocate Encounter  Received notification from [facility] [employer] that Prior Authorization for Symbicort has been APPROVED from 09/13/23 to 09/11/24 Last filled 07/10/23  PA #/Case ID/Reference #: request ID [id number]

## 2023-07-26 ENCOUNTER — Inpatient Hospital Stay (HOSPITAL_BASED_OUTPATIENT_CLINIC_OR_DEPARTMENT_OTHER): Payer: PPO | Admitting: Hematology

## 2023-07-26 ENCOUNTER — Encounter: Payer: Self-pay | Admitting: Internal Medicine

## 2023-07-26 ENCOUNTER — Other Ambulatory Visit (HOSPITAL_COMMUNITY): Payer: Self-pay

## 2023-07-26 ENCOUNTER — Other Ambulatory Visit: Payer: Self-pay

## 2023-07-26 DIAGNOSIS — D801 Nonfamilial hypogammaglobulinemia: Secondary | ICD-10-CM

## 2023-07-26 DIAGNOSIS — C9110 Chronic lymphocytic leukemia of B-cell type not having achieved remission: Secondary | ICD-10-CM | POA: Diagnosis not present

## 2023-07-26 DIAGNOSIS — D508 Other iron deficiency anemias: Secondary | ICD-10-CM | POA: Diagnosis not present

## 2023-07-26 MED ORDER — IRBESARTAN 300 MG PO TABS
300.0000 mg | ORAL_TABLET | Freq: Every morning | ORAL | 3 refills | Status: DC
  Filled 2023-07-26: qty 90, 90d supply, fill #0
  Filled 2023-10-28: qty 90, 90d supply, fill #1

## 2023-07-26 MED ORDER — VENETOCLAX 100 MG PO TABS
100.0000 mg | ORAL_TABLET | Freq: Every day | ORAL | 3 refills | Status: DC
  Filled 2023-07-26: qty 56, 56d supply, fill #0
  Filled 2023-09-01: qty 28, 28d supply, fill #0
  Filled 2023-10-10: qty 28, 28d supply, fill #1
  Filled 2023-11-06: qty 28, 28d supply, fill #2
  Filled 2023-12-05: qty 28, 28d supply, fill #3
  Filled 2024-01-10: qty 28, 28d supply, fill #4

## 2023-07-26 NOTE — Progress Notes (Signed)
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  INTERVAL HISTORY:  Janice [name] is a 72 y.o. 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  Patient was last seen by me on 06/19/2023 and reported bilateral leg cramps, one episode of fatigue, memory issues, and mild sleep disturbances.   I connected with Laurita Rusher Lehtinen on 07/26/23 at  3:30 PM EST by telephone visit and verified that I am speaking with the correct person using two identifiers.   I discussed the limitations, risks, security and privacy concerns of performing an evaluation and management service by telemedicine and the availability of in-person appointments. I also discussed with the patient that there may be a patient responsible charge related to this service. The patient expressed understanding and agreed to proceed.   Other persons participating in the visit and their role in the encounter: none   Patient's location: home  Provider's location: 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 Patient reports that she has been tolerating 100 MG Venetoclax daily better than 200 MG, which she was taking previously. She denies any diarrhea at this time.   She reports that  one of the lymph nodes on the left side of her neck that had decreased in size has been painful. She is unsure if this lymph node is growing in size again. She notes that it is  not as bothersome as previously.   She did endorse some nausea after her last IVIG, but denies any vomiting.   Her breathing has been stable. She reports some wheezing previously, which she attributes to sinus issues.   Patient reports increased cognitive issues. She was seen by Dr. Felipa Eth last Friday and she reports that she was not thought to have Alzeimer's. Patient reports that some days, she cannot seem to "get the words out". She notes that her symptoms were worse a couple weeks ago.   She reports some night sweats more intense than previously, but not in the past weeks.   She reports that her blood sugars did increase after IVIG and Prednisone, but has otherwise been stable.   No other concerns or new symptoms.   MEDICAL HISTORY:  Past Medical History:  Diagnosis Date   Allergy    Asthma    seasonal   Cellulitis 2013   Left toe   Chronic kidney disease    stage 1   Cirrhosis (HCC)    Common migraine    History of   Complication of anesthesia    Per pt/had breathing problems with "block" during rotator cuff surgery. 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   Coronary artery disease    DDD (degenerative disc disease)    Depression    denies takes paxil for migraines   Diabetes mellitus    type 2   Diabetic peripheral neuropathy (HCC)    Difficult airway for intubation 10/20/2022   DLx 1 with MAC 4, unable to see cords, DL with lopro S3 glidescope, easy atraumatic intubation   Difficult intubation 03/03/2023  Diverticulitis    Diverticulosis    DJD (degenerative joint disease)    Esophageal varices (HCC)    GERD (gastroesophageal reflux disease)    History of colon polyps    hyperplastic   History of gastric polyp    Hyperlipidemia    Hypertension    Iron deficiency anemia    Obesity    OSA on CPAP    cpap   Peripheral neuropathy    Pneumonia    april 2020  mild   Primary localized osteoarthritis of left knee 03/27/2019   Sensorineural hearing loss    Small  lymphocytic lymphoma (HCC)     SURGICAL HISTORY: Past Surgical History:  Procedure Laterality Date   ANKLE SURGERY     Left    BREAST BIOPSY Left 2021   left axilla, lymphoma   COLONOSCOPY  05/2018   ESOPHAGOGASTRODUODENOSCOPY (EGD) WITH PROPOFOL N/A 03/09/2023   Procedure: ESOPHAGOGASTRODUODENOSCOPY (EGD) WITH PROPOFOL;  Surgeon: Iva Boop, MD;  Location: [facility];  Service: Gastroenterology;  Laterality: N/A;   LEEP N/A 09/14/2018   Procedure: LOOP ELECTROSURGICAL EXCISION PROCEDURE (LEEP);  Surgeon: Richardean Chimera, MD;  Location: [facility];  Service: Gynecology;  Laterality: N/A;   MASS EXCISION Right 06/24/2021   Procedure: EXCISION RIGHT POSTERIOR NECK MASS;  Surgeon: Kinsinger, De Blanch, MD;  Location: [facility];  Service: General;  Laterality: Right;   ROTATOR CUFF REPAIR Bilateral 2012, 2015   TONSILLECTOMY     TOTAL KNEE ARTHROPLASTY Left 04/08/2019   Procedure: TOTAL KNEE ARTHROPLASTY;  Surgeon: Salvatore Marvel, MD;  Location: [facility];  Service: Orthopedics;  Laterality: Left;   UMBILICAL HERNIA REPAIR N/A 10/20/2022   Procedure: OPEN HERNIA REPAIR UMBILICAL ADULT with Mesh;  Surgeon: Kinsinger, De Blanch, MD;  Location: [facility];  Service: General;  Laterality: N/A;   UPPER GI ENDOSCOPY  05/2018    SOCIAL HISTORY: Social History   Socioeconomic History   Marital status: Widowed    Spouse name: Not on file   Number of children: 0   Years of education: 3 years college   Highest education level: Not on file  Occupational History   Occupation: Unemployed  Tobacco Use   Smoking status: Never    Passive exposure: Past   Smokeless tobacco: Never  Vaping Use   Vaping status: Never Used  Substance and Sexual Activity   Alcohol use: Not Currently    Comment: very rare   Drug use: No   Sexual activity: Not Currently    Birth control/protection: None, Post-menopausal  Other Topics Concern   Not on file  Social History Narrative   1 caffeine drink  daily    Right-handed   widow (husband died from [place] cirrhosis)   Social Determinants of Health   Financial Resource Strain: Medium Risk (08/20/2020)   Overall Financial Resource Strain (CARDIA)    Difficulty of Paying Living Expenses: Somewhat hard  Food Insecurity: No Food Insecurity (10/20/2022)   Hunger Vital Sign    Worried About Running Out of Food in the Last Year: Never true    Ran Out of Food in the Last Year: Never true  Transportation Needs: No Transportation Needs (10/20/2022)   PRAPARE - Administrator, Civil Service (Medical): No    Lack of Transportation (Non-Medical): No  Physical Activity: Not on file  Stress: Not on file  Social Connections: Unknown (01/25/2022)   [facility]   Social Network    Social Network: Not on  file  Intimate Partner Violence: Not At Risk (10/20/2022)   Humiliation, Afraid, Rape, and Kick questionnaire    Fear of Current or Ex-Partner: No    Emotionally Abused: No    Physically Abused: No    Sexually Abused: No    FAMILY HISTORY: Family History  Problem Relation Age of Onset   Breast cancer Mother    Bone cancer Mother    Heart failure Father    Diabetes Mellitus II Father    Hypertension Father    Diabetes Mellitus II Sister    Obesity Sister    Other Sister        retina problem   Hypertension Brother    Diabetes Mellitus II Brother    Colon cancer Neg Hx    Rectal cancer Neg Hx    Stomach cancer Neg Hx    Esophageal cancer Neg Hx    Pancreatic cancer Neg Hx    Liver disease Neg Hx     ALLERGIES:  is allergic to breo ellipta [fluticasone furoate-vilanterol], gazyva [obinutuzumab], iodine, latex, tessalon [benzonatate], zocor [simvastatin], levaquin [levofloxacin], penicillins, povidone-iodine, wellbutrin [bupropion], amoxicillin, chlorhexidine, diflucan [fluconazole], thalitone [chlorthalidone], amoxicillin-pot clavulanate, and vibra-tab [doxycycline].  MEDICATIONS:  Current  Outpatient Medications  Medication Sig Dispense Refill   albuterol (VENTOLIN HFA) 108 (90 Base) MCG/ACT inhaler Inhale 1-2 puffs into the lungs every 6 (six) hours as needed (Cough). 18 g 0   allopurinol (ZYLOPRIM) 100 MG tablet Take 1 tablet (100 mg) by mouth 2 times daily. 60 tablet 1   amLODipine (NORVASC) 10 MG tablet Take 1 tablet (10 mg total) by mouth daily. 90 tablet 3   aspirin EC 81 MG tablet Take 1 tablet (81 mg total) by mouth daily. Swallow whole. 90 tablet 3   azelastine (ASTELIN) 0.1 % nasal spray Place 1 spray into both nostrils 2 (two) times daily. Use in each nostril as directed 9 mL 11   azithromycin (ZITHROMAX Z-PAK) 250 MG tablet Take 2 tablets by mouth on day 1. Then take 1 tablet daily on days 2 - 5.  Take with food. 6 tablet 0   azithromycin (ZITHROMAX) 250 MG tablet Take 2 tablets by mouth on first day then take 1 tablet once daily for 4 days 6 tablet 0   b complex vitamins capsule Take 1 capsule by mouth in the morning.     bacitracin 500 UNIT/GM ointment Apply topically 2 (two) times daily for 10 days 30 g 0   budesonide-formoterol (SYMBICORT) 80-4.5 MCG/ACT inhaler Inhale 2 puffs into the lungs daily. (Patient taking differently: Inhale 2 puffs into the lungs 2 (two) times daily. Patient usually does 2 puffs in morning and 1 puff in evening.) 10.2 g 12   CALCIUM PO Take 1,000 mg by mouth every morning. 500 mg each     Camphor-Eucalyptus-Menthol (VICKS VAPORUB CHILDRENS) 4.8-1.2-2.6 % OINT apply a small amount under each nostril Externally 3 times daily As needed for nasal congestion 30 days 100 g 0   Carboxymethylcellul-Glycerin (REFRESH OPTIVE PF OP) Place 1 drop into both eyes 3 (three) times daily as needed (dry/irritated eyes.). Non-pres     cephALEXin (KEFLEX) 500 MG capsule Take 2 capsules (1,000 mg total) by mouth as directed 1 hour prior to dental work. 20 capsule 0   cetirizine (ZYRTEC) 10 MG tablet Take 5 mg by mouth in the morning.     Coenzyme Q10 (CO Q-10)  100 MG CAPS Take 100 mg by mouth in the morning.     Continuous  Glucose Sensor (FREESTYLE LIBRE 2 SENSOR) MISC Use to monitor blood glucose continuously - change every 14 days. 6 each 3   desoximetasone (TOPICORT) 0.25 % cream Apply a small amount to skin twice a day as needed for flares 60 g 0   diclofenac Sodium (VOLTAREN ARTHRITIS PAIN) 1 % GEL Apply topically 4 (four) times daily to bilateral knees and ankles 200 g 5   diclofenac sodium (VOLTAREN) 1 % GEL Apply 2 g topically 2 (two) times daily as needed (knee pain/ foot /Triger finger).  2   empagliflozin (JARDIANCE) 10 MG TABS tablet Take 10 mg by mouth in the morning.     empagliflozin (JARDIANCE) 10 MG TABS tablet Take 1 tablet (10 mg total) by mouth daily. 90 tablet 3   fluticasone (FLONASE) 50 MCG/ACT nasal spray Place 2 sprays into both nostrils in the morning. (Patient taking differently: Place 2 sprays into both nostrils daily as needed for allergies.) 48 g 2   furosemide (LASIX) 20 MG tablet Take 1 tablet (20 mg total) by mouth daily. 90 tablet 3   gabapentin (NEURONTIN) 300 MG capsule Take 1 capsule by mouth twice a day (Patient taking differently: Take 600 mg by mouth at bedtime.) 180 capsule 3   glucose blood test strip Use to test blood sugar 3 times daily as directed 300 each 3   hydrALAZINE (APRESOLINE) 25 MG tablet Take 1 tablet (25 mg total) by mouth 2 (two) times daily. 180 tablet 2   influenza vaccine adjuvanted (FLUAD) 0.5 ML injection Inject into the muscle. 0.5 mL 0   insulin aspart (NOVOLOG FLEXPEN) 100 UNIT/ML FlexPen Inject 30 Units into the skin daily with breakfast AND 30 Units daily with lunch AND 30 Units daily with supper. 90 mL 3   insulin aspart (NOVOLOG FLEXPEN) 100 UNIT/ML FlexPen Inject 30 Units into the skin daily with breakfast AND 30 Units daily with lunch AND 30 Units daily with supper. 45 mL 3   insulin degludec (TRESIBA FLEXTOUCH) 200 UNIT/ML FlexTouch Pen Inject 80 Units into the skin daily. 30 mL 11    Insulin Pen Needle (BD PEN NEEDLE NANO 2ND GEN) 32G X 4 MM MISC Use as directed 6 times daily 400 each 5   irbesartan (AVAPRO) 300 MG tablet Take 1 tablet (300 mg total) by mouth in the morning. 90 tablet 3   Lancets (ONETOUCH DELICA PLUS LANCET33G) MISC Use to test blood sugar 3 times daily 300 each 2   LORazepam (ATIVAN) 0.5 MG tablet Take 1-2 tablets (0.5-1 mg total) by mouth once as needed for up to 1 dose (prior to MRI or CT scan for claustrophobia). 4 tablet 0   metFORMIN (GLUCOPHAGE-XR) 500 MG 24 hr tablet Take 2 tablets (1,000 mg total) by mouth 2 (two) times daily. 360 tablet 3   omeprazole (PRILOSEC) 40 MG capsule Take 1 capsule by mouth twice daily 180 capsule 3   ondansetron (ZOFRAN-ODT) 4 MG disintegrating tablet Dissolve 1 tablet (4 mg) by mouth every 8 hours as needed for nausea or vomiting. 20 tablet 0   predniSONE (DELTASONE) 5 MG tablet Take 1 tablet (5 mg total) by mouth daily. 5 tablet 0   rosuvastatin (CRESTOR) 10 MG tablet Take 1 tablet (10 mg total) by mouth daily. 90 tablet 3   sertraline (ZOLOFT) 50 MG tablet Take 1 tablet (50 mg total) by mouth daily. (Patient taking differently: Take 50 mg by mouth every evening.) 90 tablet 3   spironolactone (ALDACTONE) 50 MG tablet Take 1 tablet (  50 mg total) by mouth daily. 90 tablet 3   terconazole (TERAZOL 3) 0.8 % vaginal cream Place 1 applicator vaginally daily as needed (yeast infections).     venetoclax (VENCLEXTA) 100 MG tablet Take 2 tablets (200 mg total) by mouth daily. Tablets should be swallowed whole with a meal and a full glass of water. 56 tablet 3   No current facility-administered medications for this visit.    REVIEW OF SYSTEMS:    10 Point review of Systems was done is negative except as noted above.   PHYSICAL EXAMINATION: TELEMEDICINE VISIT .There were no vitals taken for this visit.   LABORATORY DATA:  I have reviewed the data as listed  .    Latest Ref Rng & Units 07/13/2023   12:17 PM 06/19/2023    10:45 AM 06/14/2023   11:55 AM  CBC  WBC 4.0 - 10.5 K/uL 5.5  6.6  8.1   Hemoglobin 12.0 - 15.0 g/dL 95.6  9.8  21.3   Hematocrit 36.0 - 46.0 % 32.3  31.4  33.7   Platelets 150 - 400 K/uL 73  71  89    . CBC    Component Value Date/Time   WBC 5.5 07/13/2023 1217   WBC 56.9 (HH) 10/20/2022 1148   RBC 3.75 (L) 07/13/2023 1217   HGB 10.3 (L) 07/13/2023 1217   HGB 10.3 (L) 10/18/2021 1222   HCT 32.3 (L) 07/13/2023 1217   HCT 33.0 (L) 10/18/2021 1222   PLT 73 (L) 07/13/2023 1217   PLT 100 (LL) 10/18/2021 1222   MCV 86.1 07/13/2023 1217   MCV 84 10/18/2021 1222   MCH 27.5 07/13/2023 1217   MCHC 31.9 07/13/2023 1217   RDW 15.6 (H) 07/13/2023 1217   RDW 15.1 10/18/2021 1222   LYMPHSABS 1.4 07/13/2023 1217   MONOABS 0.9 07/13/2023 1217   EOSABS 0.0 07/13/2023 1217   BASOSABS 0.1 07/13/2023 1217     .    Latest Ref Rng & Units 07/13/2023   12:17 PM 06/19/2023   10:45 AM 06/14/2023   11:55 AM  CMP  Glucose 70 - 99 mg/dL 086  578  469   BUN 8 - 23 mg/dL 22  28  27    Creatinine 0.44 - 1.00 mg/dL 6.29  5.28  4.13   Sodium 135 - 145 mmol/L 135  130  130   Potassium 3.5 - 5.1 mmol/L 4.7  5.0  5.7   Chloride 98 - 111 mmol/L 105  102  102   CO2 22 - 32 mmol/L 24  21  23    Calcium 8.9 - 10.3 mg/dL 9.1  9.2  9.3   Total Protein 6.5 - 8.1 g/dL 6.7  7.2  6.9   Total Bilirubin 0.3 - 1.2 mg/dL 0.5  0.5  0.5   Alkaline Phos 38 - 126 U/L 79  68  74   AST 15 - 41 U/L 22  20  19    ALT 0 - 44 U/L 26  26  23     . Lab Results  Component Value Date   LDH 152 07/13/2023   . Lab Results  Component Value Date   IRON 110 05/17/2023   TIBC 449 05/17/2023   IRONPCTSAT 25 05/17/2023   (Iron and TIBC)  Lab Results  Component Value Date   FERRITIN 34 05/17/2023    04/08/2020 FISH/CLL Panel:   03/30/2020 Left Axilla Flow Pathology Report [id number]):   03/30/2020 Left axilla lymph node Bx ([id number]):   RADIOGRAPHIC STUDIES:  I have personally reviewed the radiological  images as listed and agreed with the findings in the report. No results found.  MRI abdomen with and without contrast 07/08/2021 showed IMPRESSION: 1. Moderate motion and patient body habitus degraded exam. 2. Extensive abdominal adenopathy, similar to slightly increased compared to 05/01/2020 PET. 3. Cirrhosis and hepatosplenomegaly with suspicion of periesophageal varices. 4. Nonspecific hypoenhancing or nonenhancing splenic lesions. These could simply represent Hessie Knows bodies in the setting of cirrhosis. Lymphomatous involvement cannot be excluded. 5. Heterogeneous enhancement throughout the liver, without well-defined suspicious mass. Possibly due to altered perfusion in the setting of cirrhosis and porta hepatis adenopathy. Lymphomatous involvement cannot be excluded. Consider pre and post-contrast MRI follow-up at 2-3 months. A more aggressive approach would include random liver biopsy.  MRI abdomen with and without contrast 02/04/2022 showed IMPRESSION: 1. Examination is generally limited by breath motion artifact, particularly multiphasic contrast enhanced sequences. Within this limitation, cirrhotic morphology of the liver without focal liver lesion. 2. 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 3. Trace ascites. 4. Numerous enlarged gastrohepatic ligament, portacaval, retroperitoneal, and small bowel mesenteric lymph nodes are unchanged, in keeping with history of lymphoma.  ASSESSMENT & PLAN:   72 yo female with   1) CLL/SLL -02/28/2020 MM Breast ([id number]) revealed "Bilateral axillary adenopathy." -03/30/2020 Left axilla lymph node biopsy ([id number]) revealed "CHRONIC LYMPHOCYTIC LYMPHOMA/SMALL LYMPHOCYTIC LYMPHOMA".  -03/30/2020 Left Axilla Flow Pathology Report [id number]) revealed "Monoclonal B-cell population with coexpression of CD5 comprises 80% of all  lymphocytes". -04/20/20 PET/CT revealed 1. Adenopathy within the neck, chest, abdomen, and pelvis, consistent with active lymphoma. Much of this is not significantly hypermetabolic. Some upper abdominal nodes are moderately hypermetabolic.  2) Mild thrombocytopenia Most likely related to advanced liver cirrhosis and partly could also be from CLL  #3 severe hypogammaglobinemia due to CLL IgG level 272 Component     Latest Ref Rng 09/20/2022  IgG (Immunoglobin G), Serum     586 - 1,602 mg/dL 956 (L)   IgA     64 - 422 mg/dL 15 (L)   IgM (Immunoglobulin M), Srm     26 - 217 mg/dL <5 (L)     Legend: (L) Low  #4 iron deficiency Anemia-- from Epistaxis + ?GI losses. NO overt GI bleeding noted. . Lab Results  Component Value Date   IRON 110 05/17/2023   TIBC 449 05/17/2023   IRONPCTSAT 25 05/17/2023   (Iron and TIBC)  Lab Results  Component Value Date   FERRITIN 34 05/17/2023    PLAN:  -Discussed lab results from 07/13/2023 in detail with patient. CBC showed WBC of 5.5K, hemoglobin improved to 10.3, and platelets slightly low at 73K. -WBCs which were previously significantly elevated have normalized -continue 100 MG venetoclax at this time -if her blood counts continue to be stable, there may be a role to increase her Venetoclax dose down the line -continue IVIG every 4 weeks to prevent frequent infections -will hold allopurinol -answered all of patient's questions in detail  -will continue to monitor with labs in 4-6 weeks  FOLLOW-UP: RTC with Dr Candise Che with labs in 2 months -continue monthly IVG x 4  The total time spent in the appointment was *** minutes* .  All of the patient's questions were answered with apparent satisfaction. The patient knows to call the clinic with any problems, questions or concerns.   Wyvonnia Lora MD MS AAHIVMS [facility] Hematology/Oncology Physician [facility]  .*Total Encounter Time as defined by the Centers for Medicare and  Medicaid Services includes, in addition to the face-to-face time of a patient visit (documented in the note above) non-face-to-face time: obtaining and reviewing outside history, ordering and reviewing medications, tests or procedures, care coordination (communications with other health care professionals or caregivers) and documentation in the medical record.    I,Mitra Faeizi,acting as a Neurosurgeon for Wyvonnia Lora, MD.,have documented all relevant documentation on the behalf of Wyvonnia Lora, MD,as directed by  Wyvonnia Lora, MD while in the presence of Wyvonnia Lora, MD.  ***

## 2023-07-27 ENCOUNTER — Other Ambulatory Visit (HOSPITAL_COMMUNITY): Payer: Self-pay

## 2023-07-27 ENCOUNTER — Other Ambulatory Visit: Payer: PPO

## 2023-07-31 ENCOUNTER — Other Ambulatory Visit: Payer: PPO

## 2023-08-01 ENCOUNTER — Encounter: Payer: Self-pay | Admitting: Hematology

## 2023-08-01 ENCOUNTER — Other Ambulatory Visit (HOSPITAL_COMMUNITY): Payer: Self-pay

## 2023-08-02 ENCOUNTER — Ambulatory Visit: Payer: PPO | Admitting: Neurology

## 2023-08-02 DIAGNOSIS — K7460 Unspecified cirrhosis of liver: Secondary | ICD-10-CM

## 2023-08-02 DIAGNOSIS — C8300 Small cell B-cell lymphoma, unspecified site: Secondary | ICD-10-CM

## 2023-08-02 DIAGNOSIS — I351 Nonrheumatic aortic (valve) insufficiency: Secondary | ICD-10-CM

## 2023-08-02 DIAGNOSIS — G4733 Obstructive sleep apnea (adult) (pediatric): Secondary | ICD-10-CM | POA: Diagnosis not present

## 2023-08-02 DIAGNOSIS — C9110 Chronic lymphocytic leukemia of B-cell type not having achieved remission: Secondary | ICD-10-CM

## 2023-08-02 DIAGNOSIS — E119 Type 2 diabetes mellitus without complications: Secondary | ICD-10-CM

## 2023-08-03 ENCOUNTER — Other Ambulatory Visit: Payer: PPO

## 2023-08-03 NOTE — Progress Notes (Signed)
[facility]  Janice [name] "Janice [name]" 72 year old female [birth year]   HOME SLEEP TEST REPORT ( by Watch PAT)   STUDY DATE:  08-03-2023   ORDERING CLINICIAN: Melvyn Novas, MD  REFERRING CLINICIAN:  Dr.Dr Avva   CLINICAL INFORMATION/HISTORY: HTN, Renal disease, CLL, Severe OSA and snoring as well as hypoxemia. Janice [name] is a 72 y.o. female patient who is here for a sleep visit after a hiatus since 2018- she has been actively followed in Video visis by Shawnie Dapper, NP- last 01-18-2022.   Seen today 06/12/2023 for  getting new supplies.    Patient carries a diagnoses of CLL and is followed by oncology, couldn't tolerate the monoclonal ab therapy.  She is on oral chemotherapy and anemic.  Chief concern according to patient :  "I need to be seen to get CPAP supplies. "   Janice [name],  who prefers to go by her middle name Janice [name] , endorsed today the Epworth Sleepiness Scale at 6 out of 24 points the fatigue severity score was endorsed at 38 out of 63 points and the geriatric depression score was endorsed at 2 out of 15 points.   She has been highly compliant with CPAP 100% by days and by hours.  The average user time is 7 hours 7 minutes this is an AutoSet on factory settings between 5 and 20 cm water with 3 cm EPR her 95th percentile pressure is 10.7 cm water and her residual AHI is 0.2/h this is an excellent fit no air leakage has been noted her 95th percentile is under 11 cm water and we could actually reduce her maximum pressure.  She uses a nasal mask, the model by [employer] called the Eson ( medium.).    She is in need for a new CPAP and I will order a HST without CPAP use, for the new baseline documentation.      Epworth sleepiness score: Epworth Sleepiness Scale at 6 out of 24 points the fatigue severity score was endorsed at 38 out of 63 points and the geriatric depression score was endorsed at 2 out of 15 points.   BMI: kg/m   Neck Circumference:  19 "   FINDINGS:   Sleep Summary:   Total Recording Time (hours, min):    7 hours 51 minutes  Total Sleep Time (hours, min): 6 hours 16 minutes               Percent REM (%):    18%                                    Respiratory Indices:   Calculated pAHI (per hour): 11/h ( 2.2/h)                         REM pAHI:    0.9/h   (5.3/h)                                          NREM pAHI:     2.5/h (12.3/h)                         Supine AHI:   The patient slept exclusively in supine position  Snoring was present for only 9% of total sleep time and the mean volume was only 40 dB.  This VOLUME is at threshold of detection.                                               Oxygen Saturation Statistics:   Oxygen Saturation (%) Mean: 93%             Minimum oxygen saturation (%):     Nadir 88%       O2 Saturation Range (%):   Between 88 and 98%                                    O2 Saturation (minutes) <89%:        0.4-minute   Pulse Rate Statistics:   Pulse Mean (bpm):   78 bpm              Pulse Range:    Between 60 and 102 bpm             IMPRESSION:  This HST confirms the presence of mild obstructive sleep apnea following AASM criteria there is also a respiratory disturbance index present of 11.3/h.  No central sleep apneas were indicated by this home sleep test device, no prolonged hypoxemia was seen. The pulse rate varied greatly during REM sleep but REM sleep did not exacerbate the patient's snoring and did not cause hypoxemia.   RECOMMENDATION: Continuing CPAP with such mild apnea hypopnea indices would be optional.  If the patient feels she sleeps better while using CPAP she is to be provided with a auto titration sleep CPAP device with a setting between 5 and 10 cm water pressure 2 cm EPR, heated humidification at the interface of her choice.  This patient may forego CPAP therapy if she has not noted a benefit in the recent weeks.     INTERPRETING PHYSICIAN:   Melvyn Novas, MD  [facility] Sleep Board certified by The [employer] and Diplomate of the [employer]. Board certified In Neurology through the ABPN, Fellow of the [employer].

## 2023-08-07 ENCOUNTER — Other Ambulatory Visit (HOSPITAL_COMMUNITY): Payer: Self-pay

## 2023-08-07 ENCOUNTER — Other Ambulatory Visit: Payer: Self-pay

## 2023-08-07 ENCOUNTER — Encounter: Payer: Self-pay | Admitting: Hematology

## 2023-08-08 ENCOUNTER — Other Ambulatory Visit (HOSPITAL_COMMUNITY): Payer: Self-pay

## 2023-08-08 ENCOUNTER — Other Ambulatory Visit: Payer: Self-pay

## 2023-08-08 MED ORDER — INSULIN PEN NEEDLE 32G X 6 MM MISC
3 refills | Status: DC
  Filled 2023-08-08: qty 400, 67d supply, fill #0

## 2023-08-09 ENCOUNTER — Encounter: Payer: Self-pay | Admitting: Hematology

## 2023-08-09 ENCOUNTER — Other Ambulatory Visit: Payer: Self-pay

## 2023-08-09 ENCOUNTER — Ambulatory Visit: Payer: PPO

## 2023-08-09 ENCOUNTER — Other Ambulatory Visit (HOSPITAL_COMMUNITY): Payer: Self-pay

## 2023-08-09 DIAGNOSIS — C9110 Chronic lymphocytic leukemia of B-cell type not having achieved remission: Secondary | ICD-10-CM

## 2023-08-14 ENCOUNTER — Other Ambulatory Visit: Payer: Self-pay | Admitting: Hematology

## 2023-08-14 ENCOUNTER — Inpatient Hospital Stay: Payer: PPO

## 2023-08-14 ENCOUNTER — Inpatient Hospital Stay: Payer: PPO | Attending: Hematology

## 2023-08-14 VITALS — BP 136/48 | HR 76 | Temp 98.0°F | Resp 16 | Ht 64.0 in | Wt 210.8 lb

## 2023-08-14 DIAGNOSIS — Z79899 Other long term (current) drug therapy: Secondary | ICD-10-CM | POA: Insufficient documentation

## 2023-08-14 DIAGNOSIS — Z7951 Long term (current) use of inhaled steroids: Secondary | ICD-10-CM | POA: Diagnosis not present

## 2023-08-14 DIAGNOSIS — Z7982 Long term (current) use of aspirin: Secondary | ICD-10-CM | POA: Insufficient documentation

## 2023-08-14 DIAGNOSIS — D801 Nonfamilial hypogammaglobulinemia: Secondary | ICD-10-CM

## 2023-08-14 DIAGNOSIS — G479 Sleep disorder, unspecified: Secondary | ICD-10-CM | POA: Diagnosis not present

## 2023-08-14 DIAGNOSIS — Z7969 Long term (current) use of other immunomodulators and immunosuppressants: Secondary | ICD-10-CM | POA: Diagnosis not present

## 2023-08-14 DIAGNOSIS — C9110 Chronic lymphocytic leukemia of B-cell type not having achieved remission: Secondary | ICD-10-CM | POA: Diagnosis not present

## 2023-08-14 DIAGNOSIS — D696 Thrombocytopenia, unspecified: Secondary | ICD-10-CM | POA: Insufficient documentation

## 2023-08-14 DIAGNOSIS — R252 Cramp and spasm: Secondary | ICD-10-CM | POA: Insufficient documentation

## 2023-08-14 DIAGNOSIS — D509 Iron deficiency anemia, unspecified: Secondary | ICD-10-CM | POA: Insufficient documentation

## 2023-08-14 DIAGNOSIS — C8300 Small cell B-cell lymphoma, unspecified site: Secondary | ICD-10-CM

## 2023-08-14 LAB — CMP (CANCER CENTER ONLY)
ALT: 25 U/L (ref 0–44)
AST: 19 U/L (ref 15–41)
Albumin: 4.5 g/dL (ref 3.5–5.0)
Alkaline Phosphatase: 95 U/L (ref 38–126)
Anion gap: 7 (ref 5–15)
BUN: 30 mg/dL — ABNORMAL HIGH (ref 8–23)
CO2: 22 mmol/L (ref 22–32)
Calcium: 9.4 mg/dL (ref 8.9–10.3)
Chloride: 103 mmol/L (ref 98–111)
Creatinine: 1 mg/dL (ref 0.44–1.00)
GFR, Estimated: 60 mL/min — ABNORMAL LOW (ref >60)
Glucose, Bld: 164 mg/dL — ABNORMAL HIGH (ref 70–99)
Potassium: 4.9 mmol/L (ref 3.5–5.1)
Sodium: 132 mmol/L — ABNORMAL LOW (ref 135–145)
Total Bilirubin: 0.6 mg/dL (ref <1.2)
Total Protein: 6.8 g/dL (ref 6.5–8.1)

## 2023-08-14 LAB — CBC WITH DIFFERENTIAL (CANCER CENTER ONLY)
Abs Immature Granulocytes: 0.02 10*3/uL (ref 0.00–0.07)
Basophils Absolute: 0.1 10*3/uL (ref 0.0–0.1)
Basophils Relative: 1 %
Eosinophils Absolute: 0.1 10*3/uL (ref 0.0–0.5)
Eosinophils Relative: 1 %
HCT: 35.9 % — ABNORMAL LOW (ref 36.0–46.0)
Hemoglobin: 11.2 g/dL — ABNORMAL LOW (ref 12.0–15.0)
Immature Granulocytes: 0 %
Lymphocytes Relative: 21 %
Lymphs Abs: 1.5 10*3/uL (ref 0.7–4.0)
MCH: 25.7 pg — ABNORMAL LOW (ref 26.0–34.0)
MCHC: 31.2 g/dL (ref 30.0–36.0)
MCV: 82.5 fL (ref 80.0–100.0)
Monocytes Absolute: 0.9 10*3/uL (ref 0.1–1.0)
Monocytes Relative: 12 %
Neutro Abs: 4.6 10*3/uL (ref 1.7–7.7)
Neutrophils Relative %: 65 %
Platelet Count: 87 10*3/uL — ABNORMAL LOW (ref 150–400)
RBC: 4.35 MIL/uL (ref 3.87–5.11)
RDW: 15.3 % (ref 11.5–15.5)
WBC Count: 7 10*3/uL (ref 4.0–10.5)
nRBC: 0 % (ref 0.0–0.2)

## 2023-08-14 LAB — URIC ACID: Uric Acid, Serum: 4.9 mg/dL (ref 2.5–7.1)

## 2023-08-14 LAB — LACTATE DEHYDROGENASE: LDH: 155 U/L (ref 98–192)

## 2023-08-14 MED ORDER — DEXTROSE 5 % IV SOLN
INTRAVENOUS | Status: DC
Start: 2023-08-14 — End: 2023-08-14

## 2023-08-14 MED ORDER — IMMUNE GLOBULIN (HUMAN) 10 GM/100ML IV SOLN
400.0000 mg/kg | INTRAVENOUS | Status: DC
Start: 2023-08-14 — End: 2023-08-14
  Administered 2023-08-14: 40 g via INTRAVENOUS
  Filled 2023-08-14: qty 400

## 2023-08-14 MED ORDER — DIPHENHYDRAMINE HCL 25 MG PO CAPS
25.0000 mg | ORAL_CAPSULE | Freq: Once | ORAL | Status: AC
  Administered 2023-08-14: 25 mg via ORAL
  Filled 2023-08-14: qty 1

## 2023-08-14 MED ORDER — METHYLPREDNISOLONE SODIUM SUCC 40 MG IJ SOLR
40.0000 mg | Freq: Every day | INTRAMUSCULAR | Status: DC
  Administered 2023-08-14: 40 mg via INTRAVENOUS
  Filled 2023-08-14: qty 1

## 2023-08-14 MED ORDER — ACETAMINOPHEN 325 MG PO TABS
650.0000 mg | ORAL_TABLET | Freq: Once | ORAL | Status: AC
  Administered 2023-08-14: 650 mg via ORAL
  Filled 2023-08-14: qty 2

## 2023-08-14 MED ORDER — ONDANSETRON HCL 4 MG/2ML IJ SOLN
4.0000 mg | Freq: Once | INTRAMUSCULAR | Status: AC
Start: 2023-08-14 — End: 2023-08-14
  Administered 2023-08-14: 4 mg via INTRAVENOUS
  Filled 2023-08-14: qty 2

## 2023-08-14 NOTE — Patient Instructions (Signed)

## 2023-08-14 NOTE — Progress Notes (Signed)
Patient noted nausea 1.5 hours into Privigen infusion. Patient noted that she had taken a Zofran prophylactically at the start of the infusion. Received verbal orders to give additional 4 mg Zofran IV from Daphane Shepherd, PA-C. Infusion paused and allowed to flush. Zofran administered and flushed prior to restarting infusion.  Patient stable throughout infusion.  Patient stated that this had been an ongoing issue. RN notified Dr. Candise Che about the situation and to determine if additional pre-medications would be needed for future treatments.   Patient declined to stay for 30 minute post-observation period following Privigen infusion.  Patient's vital signs retaken prior to discharge and remained within normal parameters.  Patient discharged in stable condition.

## 2023-08-15 ENCOUNTER — Other Ambulatory Visit (HOSPITAL_COMMUNITY): Payer: Self-pay

## 2023-08-16 ENCOUNTER — Other Ambulatory Visit: Payer: Self-pay

## 2023-08-16 ENCOUNTER — Other Ambulatory Visit (HOSPITAL_COMMUNITY): Payer: Self-pay

## 2023-08-16 MED ORDER — DICLOFENAC SODIUM 2 % EX SOLN
2.0000 | Freq: Four times a day (QID) | CUTANEOUS | 5 refills | Status: DC | PRN
  Filled 2023-08-16 – 2023-08-17 (×2): qty 112, 30d supply, fill #0

## 2023-08-16 MED ORDER — METFORMIN HCL ER 500 MG PO TB24
1000.0000 mg | ORAL_TABLET | Freq: Two times a day (BID) | ORAL | 3 refills | Status: DC
  Filled 2023-08-16: qty 360, 90d supply, fill #0
  Filled 2023-11-17: qty 360, 90d supply, fill #1
  Filled 2024-03-05: qty 360, 90d supply, fill #2

## 2023-08-17 ENCOUNTER — Other Ambulatory Visit: Payer: Self-pay

## 2023-08-18 ENCOUNTER — Encounter: Payer: Self-pay | Admitting: Hematology

## 2023-08-19 ENCOUNTER — Encounter: Payer: Self-pay | Admitting: Hematology

## 2023-08-19 ENCOUNTER — Other Ambulatory Visit (HOSPITAL_COMMUNITY): Payer: Self-pay

## 2023-08-19 NOTE — Procedures (Signed)
[facility]  Forbes Cellar Ambs "Janice [name]" 72 year old female [birth year]   HOME SLEEP TEST REPORT ( by Watch PAT)   STUDY DATE:  08-03-2023   ORDERING CLINICIAN: Melvyn Novas, MD  REFERRING CLINICIAN:  Dr.Dr Avva   CLINICAL INFORMATION/HISTORY: HTN, Renal disease, CLL, Severe OSA and snoring as well as hypoxemia. Janice [name] is a 72 y.o. female patient who is here for a sleep visit after a hiatus since 2018- she has been actively followed in Video visis by Shawnie Dapper, NP- last 01-18-2022.   Seen today 06/12/2023 for  getting new supplies.    Patient carries a diagnoses of CLL and is followed by oncology, couldn't tolerate the monoclonal ab therapy.  She is on oral chemotherapy and anemic.  Chief concern according to patient :  "I need to be seen to get CPAP supplies. "   Janice [name],  who prefers to go by her middle name Janice [name] , endorsed today the Epworth Sleepiness Scale at 6 out of 24 points the fatigue severity score was endorsed at 38 out of 63 points and the geriatric depression score was endorsed at 2 out of 15 points.   She has been highly compliant with CPAP 100% by days and by hours.  The average user time is 7 hours 7 minutes this is an AutoSet on factory settings between 5 and 20 cm water with 3 cm EPR her 95th percentile pressure is 10.7 cm water and her residual AHI is 0.2/h this is an excellent fit no air leakage has been noted her 95th percentile is under 11 cm water and we could actually reduce her maximum pressure.  She uses a nasal mask, the model by [employer] called the Eson ( medium.).    She is in need for a new CPAP and I will order a HST without CPAP use, for the new baseline documentation.      Epworth sleepiness score: Epworth Sleepiness Scale at 6 out of 24 points the fatigue severity score was endorsed at 38 out of 63 points and the geriatric depression score was endorsed at 2 out of 15 points.   BMI: kg/m   Neck Circumference:  19 "   FINDINGS:   Sleep Summary:   Total Recording Time (hours, min):    7 hours 51 minutes  Total Sleep Time (hours, min): 6 hours 16 minutes               Percent REM (%):    18%                                    Respiratory Indices:   Calculated pAHI (per hour): 11/h ( 2.2/h)                         REM pAHI:    0.9/h   (5.3/h)                                          NREM pAHI:     2.5/h (12.3/h)                         Supine AHI:   The patient slept exclusively in supine position  Snoring was present for only 9% of total sleep time and the mean volume was only 40 dB.  This VOLUME is at threshold of detection.                                               Oxygen Saturation Statistics:   Oxygen Saturation (%) Mean: 93%             Minimum oxygen saturation (%):     Nadir 88%       O2 Saturation Range (%):   Between 88 and 98%                                    O2 Saturation (minutes) <89%:        0.4-minute   Pulse Rate Statistics:   Pulse Mean (bpm):   78 bpm              Pulse Range:    Between 60 and 102 bpm             IMPRESSION:  This HST confirms the presence of mild obstructive sleep apnea following AASM criteria there is also a respiratory disturbance index present of 11.3/h.  No central sleep apneas were indicated by this home sleep test device, no prolonged hypoxemia was seen. The pulse rate varied greatly during REM sleep but REM sleep did not exacerbate the patient's snoring and did not cause hypoxemia.   RECOMMENDATION: Continuing CPAP with such mild apnea hypopnea indices would be optional.  If the patient feels she sleeps better while using CPAP she is to be provided with a auto titration sleep CPAP device with a setting between 5 and 10 cm water pressure 2 cm EPR, heated humidification at the interface of her choice.  This patient may forego CPAP therapy if she has not noted a benefit in the recent weeks.     INTERPRETING PHYSICIAN:   Melvyn Novas, MD  [facility] Sleep Board certified by The [employer] and Diplomate of the [employer]. Board certified In Neurology through the ABPN, Fellow of the [employer].

## 2023-08-21 ENCOUNTER — Other Ambulatory Visit (HOSPITAL_COMMUNITY): Payer: Self-pay

## 2023-08-21 ENCOUNTER — Telehealth: Payer: Self-pay | Admitting: Internal Medicine

## 2023-08-21 ENCOUNTER — Other Ambulatory Visit (HOSPITAL_COMMUNITY): Payer: Self-pay | Admitting: Internal Medicine

## 2023-08-21 ENCOUNTER — Encounter: Payer: Self-pay | Admitting: Hematology

## 2023-08-21 DIAGNOSIS — K5792 Diverticulitis of intestine, part unspecified, without perforation or abscess without bleeding: Secondary | ICD-10-CM | POA: Diagnosis not present

## 2023-08-21 DIAGNOSIS — I10 Essential (primary) hypertension: Secondary | ICD-10-CM | POA: Diagnosis not present

## 2023-08-21 DIAGNOSIS — R1032 Left lower quadrant pain: Secondary | ICD-10-CM | POA: Diagnosis not present

## 2023-08-21 DIAGNOSIS — D696 Thrombocytopenia, unspecified: Secondary | ICD-10-CM | POA: Diagnosis not present

## 2023-08-21 DIAGNOSIS — D509 Iron deficiency anemia, unspecified: Secondary | ICD-10-CM | POA: Diagnosis not present

## 2023-08-21 DIAGNOSIS — K649 Unspecified hemorrhoids: Secondary | ICD-10-CM | POA: Diagnosis not present

## 2023-08-21 DIAGNOSIS — E871 Hypo-osmolality and hyponatremia: Secondary | ICD-10-CM | POA: Diagnosis not present

## 2023-08-21 DIAGNOSIS — C8300 Small cell B-cell lymphoma, unspecified site: Secondary | ICD-10-CM | POA: Diagnosis not present

## 2023-08-21 DIAGNOSIS — K5900 Constipation, unspecified: Secondary | ICD-10-CM | POA: Diagnosis not present

## 2023-08-21 MED ORDER — HYDROCORTISONE ACETATE 25 MG RE SUPP
25.0000 mg | Freq: Every day | RECTAL | 0 refills | Status: AC
  Filled 2023-08-21: qty 30, 30d supply, fill #0

## 2023-08-21 NOTE — Telephone Encounter (Signed)
Pt stated that she went to see her PCP today for her symptoms and there office has ordered a CT scan for tomorrow and they are treating her. Pt verbalized understanding with all questions answered.

## 2023-08-21 NOTE — Telephone Encounter (Signed)
PT is having a diverticulitis flare along with severe constipation and would like recommendations for relief. Please advise.

## 2023-08-22 ENCOUNTER — Ambulatory Visit (HOSPITAL_COMMUNITY)
Admission: RE | Admit: 2023-08-22 | Discharge: 2023-08-22 | Disposition: A | Discharge status: Home / Self Care | Payer: PPO | Source: Ambulatory Visit | Attending: Internal Medicine | Admitting: Internal Medicine

## 2023-08-22 ENCOUNTER — Other Ambulatory Visit (HOSPITAL_COMMUNITY): Payer: Self-pay

## 2023-08-22 ENCOUNTER — Encounter: Payer: Self-pay | Admitting: Hematology

## 2023-08-22 ENCOUNTER — Ambulatory Visit (HOSPITAL_COMMUNITY): Payer: PPO

## 2023-08-22 ENCOUNTER — Encounter (HOSPITAL_COMMUNITY): Payer: Self-pay

## 2023-08-22 DIAGNOSIS — K7460 Unspecified cirrhosis of liver: Secondary | ICD-10-CM | POA: Diagnosis not present

## 2023-08-22 DIAGNOSIS — R590 Localized enlarged lymph nodes: Secondary | ICD-10-CM | POA: Diagnosis not present

## 2023-08-22 DIAGNOSIS — K5732 Diverticulitis of large intestine without perforation or abscess without bleeding: Secondary | ICD-10-CM | POA: Diagnosis not present

## 2023-08-22 DIAGNOSIS — K5792 Diverticulitis of intestine, part unspecified, without perforation or abscess without bleeding: Secondary | ICD-10-CM | POA: Diagnosis not present

## 2023-08-22 MED ORDER — CIPROFLOXACIN HCL 500 MG PO TABS
500.0000 mg | ORAL_TABLET | Freq: Two times a day (BID) | ORAL | 0 refills | Status: AC
  Filled 2023-08-22: qty 14, 7d supply, fill #0

## 2023-08-22 MED ORDER — METRONIDAZOLE 500 MG PO TABS
500.0000 mg | ORAL_TABLET | Freq: Three times a day (TID) | ORAL | 0 refills | Status: AC
  Filled 2023-08-22: qty 30, 10d supply, fill #0

## 2023-08-23 ENCOUNTER — Other Ambulatory Visit (HOSPITAL_COMMUNITY): Payer: Self-pay

## 2023-08-24 ENCOUNTER — Other Ambulatory Visit: Payer: Self-pay | Admitting: Hematology

## 2023-08-24 ENCOUNTER — Other Ambulatory Visit (HOSPITAL_COMMUNITY): Payer: Self-pay

## 2023-08-24 ENCOUNTER — Encounter: Payer: Self-pay | Admitting: Hematology

## 2023-08-24 ENCOUNTER — Other Ambulatory Visit: Payer: Self-pay

## 2023-08-24 ENCOUNTER — Other Ambulatory Visit: Payer: Self-pay | Admitting: Hematology and Oncology

## 2023-08-24 DIAGNOSIS — C9110 Chronic lymphocytic leukemia of B-cell type not having achieved remission: Secondary | ICD-10-CM

## 2023-08-24 DIAGNOSIS — C8300 Small cell B-cell lymphoma, unspecified site: Secondary | ICD-10-CM

## 2023-08-24 DIAGNOSIS — R110 Nausea: Secondary | ICD-10-CM

## 2023-08-24 DIAGNOSIS — D500 Iron deficiency anemia secondary to blood loss (chronic): Secondary | ICD-10-CM

## 2023-08-24 MED ORDER — ALLOPURINOL 100 MG PO TABS
100.0000 mg | ORAL_TABLET | Freq: Two times a day (BID) | ORAL | 1 refills | Status: DC
  Filled 2023-08-24: qty 60, 30d supply, fill #0
  Filled 2023-09-18: qty 60, 30d supply, fill #1

## 2023-08-25 ENCOUNTER — Other Ambulatory Visit (HOSPITAL_COMMUNITY): Payer: Self-pay

## 2023-08-25 ENCOUNTER — Other Ambulatory Visit: Payer: Self-pay

## 2023-08-25 DIAGNOSIS — I129 Hypertensive chronic kidney disease with stage 1 through stage 4 chronic kidney disease, or unspecified chronic kidney disease: Secondary | ICD-10-CM | POA: Diagnosis not present

## 2023-08-25 DIAGNOSIS — N182 Chronic kidney disease, stage 2 (mild): Secondary | ICD-10-CM | POA: Diagnosis not present

## 2023-08-25 MED ORDER — BD PEN NEEDLE MICRO U/F 32G X 6 MM MISC
Freq: Every day | 3 refills | Status: DC
  Filled 2023-08-25: qty 400, 60d supply, fill #0

## 2023-08-26 ENCOUNTER — Encounter: Payer: Self-pay | Admitting: Hematology

## 2023-08-28 ENCOUNTER — Other Ambulatory Visit: Payer: Self-pay

## 2023-08-28 NOTE — Patient Instructions (Addendum)
Please continue using your CPAP regularly. While your insurance requires that you use CPAP at least 4 hours each night on 70% of the nights, I recommend, that you not skip any nights and use it throughout the night if you can. Getting used to CPAP and staying with the treatment long term does take time and patience and discipline. Untreated obstructive sleep apnea when it is moderate to severe can have an adverse impact on cardiovascular health and raise her risk for heart disease, arrhythmias, hypertension, congestive heart failure, stroke and diabetes. Untreated obstructive sleep apnea causes sleep disruption, nonrestorative sleep, and sleep deprivation. This can have an impact on your day to day functioning and cause daytime sleepiness and impairment of cognitive function, memory loss, mood disturbance, and problems focussing. Using CPAP regularly can improve these symptoms.  I will order you a new machine. You should hear back from the DME to set up new machine   Follow up in 31-90 days following set up of your new device.

## 2023-08-28 NOTE — Progress Notes (Signed)
PATIENT: Janice [name] DOB: September 30, 1950  REASON FOR VISIT: follow up HISTORY FROM: patient  Chief Complaint  Patient presents with   Follow-up    Pt in room 1 alone.  Here for cpap follow up. Patient reports doing well, no concerns.      HISTORY OF PRESENT ILLNESS:  08/29/23 ALL:  Janice [name] is a 72 y.o. female here today for follow up for OSA on CPAP. She was last seen by Dr Vickey Huger 05/2023 in need of new machine. HST 07/2023 showed mild OSa with AHI 11/hr. AutoPAP optional per Dr Dohmeier's result note. She continues to do well on her old machine. She is interested in continuing therapy and wishes for me to order a new machine. She denies concerns with current machine.      REVIEW OF SYSTEMS: Out of a complete 14 system review of symptoms, the patient complains only of the following symptoms, none and all other reviewed systems are negative.  ESS: 6/24  ALLERGIES: Allergies  Allergen Reactions   Breo Ellipta [Fluticasone Furoate-Vilanterol] Anaphylaxis and Hives    Other reaction(s): hives  breo ellipta   Gazyva [Obinutuzumab] Shortness Of Breath    04/27/23- SOB, Severe back pain/neck pain, HA, flushing and some diaphoresis; see progress note   Iodine Hives, Swelling and Other (See Comments)    And Betadine/caused hives and swelling   Latex Hives, Itching, Rash and Other (See Comments)    Burning   Tessalon [Benzonatate] Anaphylaxis, Hives and Other (See Comments)   Zocor [Simvastatin] Other (See Comments)    Caused neuropathy in arms Neuropathy in arms   Levaquin [Levofloxacin] Other (See Comments)    Joint aches//leg, shoulder pain Joint pain   Penicillins Hives   Povidone-Iodine Hives   Wellbutrin [Bupropion] Other (See Comments)    Negative thoughts. Crazy thoughts   Amoxicillin Hives    Blisters Did it involve swelling of the face/tongue/throat, SOB, or low BP? Yes Did it involve sudden or severe rash/hives, skin peeling, or any reaction on  the inside of your mouth or nose? Yes Did you need to seek medical attention at a hospital or doctor's office?Unknown When did it last happen? 2019      If all above answers are "NO", may proceed with cephalosporin use.    Chlorhexidine Itching     Chlorhexidine Gluconate (CHG) Cloths/topical solution   Diflucan [Fluconazole] Hives and Other (See Comments)    blister   Thalitone [Chlorthalidone] Other (See Comments)    Ears ringing    Amoxicillin-Pot Clavulanate Rash   Vibra-Tab [Doxycycline] Diarrhea and Itching    HOME MEDICATIONS: Outpatient Medications Prior to Visit  Medication Sig Dispense Refill   albuterol (VENTOLIN HFA) 108 (90 Base) MCG/ACT inhaler Inhale 1-2 puffs into the lungs every 6 (six) hours as needed (Cough). 18 g 0   allopurinol (ZYLOPRIM) 100 MG tablet Take 1 tablet (100 mg) by mouth 2 times daily. 60 tablet 1   amLODipine (NORVASC) 10 MG tablet Take 1 tablet (10 mg total) by mouth daily. 90 tablet 3   aspirin EC 81 MG tablet Take 1 tablet (81 mg total) by mouth daily. Swallow whole. 90 tablet 3   azithromycin (ZITHROMAX Z-PAK) 250 MG tablet Take 2 tablets by mouth on day 1. Then take 1 tablet daily on days 2 - 5.  Take with food. 6 tablet 0   azithromycin (ZITHROMAX) 250 MG tablet Take 2 tablets by mouth on first day then take 1 tablet once daily for  4 days 6 tablet 0   b complex vitamins capsule Take 1 capsule by mouth in the morning.     bacitracin 500 UNIT/GM ointment Apply topically 2 (two) times daily for 10 days 30 g 0   budesonide-formoterol (SYMBICORT) 80-4.5 MCG/ACT inhaler Inhale 2 puffs into the lungs daily. (Patient taking differently: Inhale 2 puffs into the lungs 2 (two) times daily. Patient usually does 2 puffs in morning and 1 puff in evening.) 10.2 g 12   CALCIUM PO Take 1,000 mg by mouth every morning. 500 mg each     Carboxymethylcellul-Glycerin (REFRESH OPTIVE PF OP) Place 1 drop into both eyes 3 (three) times daily as needed (dry/irritated  eyes.). Non-pres     cephALEXin (KEFLEX) 500 MG capsule Take 2 capsules (1,000 mg total) by mouth as directed 1 hour prior to dental work. 20 capsule 0   cetirizine (ZYRTEC) 10 MG tablet Take 5 mg by mouth in the morning.     ciprofloxacin (CIPRO) 500 MG tablet Take 1 tablet (500 mg total) by mouth every 12 (twelve) hours for 7 days. 14 tablet 0   Coenzyme Q10 (CO Q-10) 100 MG CAPS Take 100 mg by mouth in the morning.     Continuous Glucose Sensor (FREESTYLE LIBRE 2 SENSOR) MISC Use to monitor blood glucose continuously - change every 14 days. 6 each 3   desoximetasone (TOPICORT) 0.25 % cream Apply a small amount to skin twice a day as needed for flares 60 g 0   diclofenac Sodium (PENNSAID) 2 % SOLN Apply 2 Pump (40 mg total) topically up to 4 (four) times daily as needed. 112 g 5   diclofenac Sodium (VOLTAREN ARTHRITIS PAIN) 1 % GEL Apply topically 4 (four) times daily to bilateral knees and ankles 200 g 5   diclofenac sodium (VOLTAREN) 1 % GEL Apply 2 g topically 2 (two) times daily as needed (knee pain/ foot /Triger finger).  2   empagliflozin (JARDIANCE) 10 MG TABS tablet Take 10 mg by mouth in the morning.     empagliflozin (JARDIANCE) 10 MG TABS tablet Take 1 tablet (10 mg total) by mouth daily. 90 tablet 3   fluticasone (FLONASE) 50 MCG/ACT nasal spray Place 2 sprays into both nostrils in the morning. (Patient taking differently: Place 2 sprays into both nostrils daily as needed for allergies.) 48 g 2   furosemide (LASIX) 20 MG tablet Take 1 tablet (20 mg total) by mouth daily. 90 tablet 3   gabapentin (NEURONTIN) 300 MG capsule Take 1 capsule by mouth twice a day (Patient taking differently: Take 600 mg by mouth at bedtime.) 180 capsule 3   glucose blood test strip Use to test blood sugar 3 times daily as directed 300 each 3   hydrALAZINE (APRESOLINE) 25 MG tablet Take 1 tablet (25 mg total) by mouth 2 (two) times daily. 180 tablet 2   hydrocortisone (ANUSOL-HC) 25 MG suppository Place 1  suppository (25 mg total) rectally daily. 30 suppository 0   influenza vaccine adjuvanted (FLUAD) 0.5 ML injection Inject into the muscle. 0.5 mL 0   insulin aspart (NOVOLOG FLEXPEN) 100 UNIT/ML FlexPen Inject 30 Units into the skin daily with breakfast AND 30 Units daily with lunch AND 30 Units daily with supper. 90 mL 3   insulin aspart (NOVOLOG FLEXPEN) 100 UNIT/ML FlexPen Inject 30 Units into the skin daily with breakfast AND 30 Units daily with lunch AND 30 Units daily with supper. 45 mL 3   insulin degludec (TRESIBA FLEXTOUCH) 200 UNIT/ML  FlexTouch Pen Inject 80 Units into the skin daily. 30 mL 11   Insulin Pen Needle (BD PEN NEEDLE MICRO U/F) 32G X 6 MM MISC Use with Evaristo Bury and Novolog Insulin six times a day 400 each 3   Insulin Pen Needle 32G X 6 MM MISC Use 6 times daily with insulin as directed 400 each 3   irbesartan (AVAPRO) 300 MG tablet Take 1 tablet (300 mg total) by mouth in the morning. 90 tablet 3   Lancets (ONETOUCH DELICA PLUS LANCET33G) MISC Use to test blood sugar 3 times daily 300 each 2   LORazepam (ATIVAN) 0.5 MG tablet Take 1-2 tablets (0.5-1 mg total) by mouth once as needed for up to 1 dose (prior to MRI or CT scan for claustrophobia). 4 tablet 0   metFORMIN (GLUCOPHAGE-XR) 500 MG 24 hr tablet Take 2 tablets (1,000 mg total) by mouth 2 (two) times daily. 360 tablet 3   metroNIDAZOLE (FLAGYL) 500 MG tablet Take 1 tablet (500 mg total) by mouth 3 (three) times daily for 10 days. 30 tablet 0   omeprazole (PRILOSEC) 40 MG capsule Take 1 capsule by mouth twice daily 180 capsule 3   ondansetron (ZOFRAN-ODT) 4 MG disintegrating tablet Dissolve 1 tablet (4 mg) by mouth every 8 hours as needed for nausea or vomiting. 20 tablet 0   predniSONE (DELTASONE) 5 MG tablet Take 1 tablet (5 mg total) by mouth daily. 5 tablet 0   rosuvastatin (CRESTOR) 10 MG tablet Take 1 tablet (10 mg total) by mouth daily. 90 tablet 3   sertraline (ZOLOFT) 50 MG tablet Take 1 tablet (50 mg total) by  mouth daily. (Patient taking differently: Take 50 mg by mouth every evening.) 90 tablet 3   spironolactone (ALDACTONE) 50 MG tablet Take 1 tablet (50 mg total) by mouth daily. 90 tablet 3   terconazole (TERAZOL 3) 0.8 % vaginal cream Place 1 applicator vaginally daily as needed (yeast infections).     venetoclax (VENCLEXTA) 100 MG tablet Take 1 tablet (100 mg total) by mouth daily. Tablets should be swallowed whole with a meal and a full glass of water. 30 tablet 3   azelastine (ASTELIN) 0.1 % nasal spray Place 1 spray into both nostrils 2 (two) times daily. Use in each nostril as directed 9 mL 11   Camphor-Eucalyptus-Menthol (VICKS VAPORUB CHILDRENS) 4.8-1.2-2.6 % OINT apply a small amount under each nostril Externally 3 times daily As needed for nasal congestion 30 days 100 g 0   No facility-administered medications prior to visit.    PAST MEDICAL HISTORY: Past Medical History:  Diagnosis Date   Allergy    Asthma    seasonal   Cellulitis 2013   Left toe   Chronic kidney disease    stage 1   Cirrhosis (HCC)    Common migraine    History of   Complication of anesthesia    Per pt/had breathing problems with "block" during rotator cuff surgery. 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   Coronary artery disease    DDD (degenerative disc disease)    Depression    denies takes paxil for migraines   Diabetes mellitus    type 2   Diabetic peripheral neuropathy (HCC)    Difficult airway for intubation 10/20/2022   DLx 1 with MAC 4, unable to see cords, DL with lopro S3 glidescope, easy atraumatic intubation   Difficult intubation 03/03/2023   Diverticulitis    Diverticulosis    DJD (degenerative joint disease)  Esophageal varices (HCC)    GERD (gastroesophageal reflux disease)    History of colon polyps    hyperplastic   History of gastric polyp    Hyperlipidemia    Hypertension    Iron deficiency anemia    Obesity    OSA on CPAP    cpap   Peripheral neuropathy    Pneumonia     april 2020  mild   Primary localized osteoarthritis of left knee 03/27/2019   Sensorineural hearing loss    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HCC)     PAST SURGICAL HISTORY: Past Surgical History:  Procedure Laterality Date   ANKLE SURGERY     Left    BREAST BIOPSY Left 2021   left axilla, lymphoma   COLONOSCOPY  05/2018   ESOPHAGOGASTRODUODENOSCOPY (EGD) WITH PROPOFOL N/A 03/09/2023   Procedure: ESOPHAGOGASTRODUODENOSCOPY (EGD) WITH PROPOFOL;  Surgeon: Iva Boop, MD;  Location: [facility];  Service: Gastroenterology;  Laterality: N/A;   LEEP N/A 09/14/2018   Procedure: LOOP ELECTROSURGICAL EXCISION PROCEDURE (LEEP);  Surgeon: Richardean Chimera, MD;  Location: [facility];  Service: Gynecology;  Laterality: N/A;   MASS EXCISION Right 06/24/2021   Procedure: EXCISION RIGHT POSTERIOR NECK MASS;  Surgeon: Kinsinger, De Blanch, MD;  Location: [facility];  Service: General;  Laterality: Right;   ROTATOR CUFF REPAIR Bilateral 2012, 2015   TONSILLECTOMY     TOTAL KNEE ARTHROPLASTY Left 04/08/2019   Procedure: TOTAL KNEE ARTHROPLASTY;  Surgeon: Salvatore Marvel, MD;  Location: [facility];  Service: Orthopedics;  Laterality: Left;   UMBILICAL HERNIA REPAIR N/A 10/20/2022   Procedure: OPEN HERNIA REPAIR UMBILICAL ADULT with Mesh;  Surgeon: Kinsinger, De Blanch, MD;  Location: [facility];  Service: General;  Laterality: N/A;   UPPER GI ENDOSCOPY  05/2018    FAMILY HISTORY: Family History  Problem Relation Age of Onset   Breast cancer Mother    Bone cancer Mother    Heart failure Father    Diabetes Mellitus II Father    Hypertension Father    Diabetes Mellitus II Sister    Obesity Sister    Other Sister        retina problem   Hypertension Brother    Diabetes Mellitus II Brother    Colon cancer Neg Hx    Rectal cancer Neg Hx    Stomach cancer Neg Hx    Esophageal cancer Neg Hx    Pancreatic cancer Neg Hx    Liver disease Neg Hx     SOCIAL HISTORY: Social History    Socioeconomic History   Marital status: Widowed    Spouse name: Not on file   Number of children: 0   Years of education: 3 years college   Highest education level: Not on file  Occupational History   Occupation: Unemployed  Tobacco Use   Smoking status: Never    Passive exposure: Past   Smokeless tobacco: Never  Vaping Use   Vaping status: Never Used  Substance and Sexual Activity   Alcohol use: Not Currently    Comment: very rare   Drug use: No   Sexual activity: Not Currently    Birth control/protection: None, Post-menopausal  Other Topics Concern   Not on file  Social History Narrative   1 caffeine drink daily    Right-handed   widow (husband died from [place] cirrhosis)   Social Drivers of Health   Financial Resource Strain: Medium Risk (08/20/2020)   Overall Financial Resource Strain (CARDIA)    Difficulty of  Paying Living Expenses: Somewhat hard  Food Insecurity: No Food Insecurity (10/20/2022)   Hunger Vital Sign    Worried About Running Out of Food in the Last Year: Never true    Ran Out of Food in the Last Year: Never true  Transportation Needs: No Transportation Needs (10/20/2022)   PRAPARE - Administrator, Civil Service (Medical): No    Lack of Transportation (Non-Medical): No  Physical Activity: Not on file  Stress: Not on file  Social Connections: Unknown (01/25/2022)   [facility] from [facility]   Social Network    Social Network: Not on file  Intimate Partner Violence: Not At Risk (10/20/2022)   Humiliation, Afraid, Rape, and Kick questionnaire    Fear of Current or Ex-Partner: No    Emotionally Abused: No    Physically Abused: No    Sexually Abused: No     PHYSICAL EXAM  Vitals:   08/29/23 1526  BP: (!) 94/46  Pulse: 81  Weight: 209 lb 8 oz (95 kg)  Height: 5' 4.5" (1.638 m)   Body mass index is 35.41 kg/m.  Generalized: Well developed, in no acute distress  Cardiology: normal rate and rhythm, no murmur  noted Respiratory: clear to auscultation bilaterally  Neurological examination  Mentation: Alert oriented to time, place, history taking. Follows all commands speech and language fluent Cranial nerve II-XII: Pupils were equal round reactive to light. Extraocular movements were full, visual field were full  Motor: The motor testing reveals 5 over 5 strength of all 4 extremities. Good symmetric motor tone is noted throughout.  Gait and station: Gait is normal.    DIAGNOSTIC DATA (LABS, IMAGING, TESTING) - I reviewed patient records, labs, notes, testing and imaging myself where available.      No data to display           Lab Results  Component Value Date   WBC 7.0 08/14/2023   HGB 11.2 (L) 08/14/2023   HCT 35.9 (L) 08/14/2023   MCV 82.5 08/14/2023   PLT 87 (L) 08/14/2023      Component Value Date/Time   NA 132 (L) 08/14/2023 1221   NA 137 10/18/2021 1222   K 4.9 08/14/2023 1221   CL 103 08/14/2023 1221   CO2 22 08/14/2023 1221   GLUCOSE 164 (H) 08/14/2023 1221   BUN 30 (H) 08/14/2023 1221   BUN 11 10/18/2021 1222   CREATININE 1.00 08/14/2023 1221   CALCIUM 9.4 08/14/2023 1221   PROT 6.8 08/14/2023 1221   ALBUMIN 4.5 08/14/2023 1221   AST 19 08/14/2023 1221   ALT 25 08/14/2023 1221   ALKPHOS 95 08/14/2023 1221   BILITOT 0.6 08/14/2023 1221   GFRNONAA 60 (L) 08/14/2023 1221   GFRAA >60 04/08/2020 1435   Lab Results  Component Value Date   CHOL 125 10/18/2021   HDL 43 10/18/2021   LDLCALC 65 10/18/2021   TRIG 86 10/18/2021   Lab Results  Component Value Date   HGBA1C 7.5 (H) 10/03/2022   No results found for: "VITAMINB12" Lab Results  Component Value Date   TSH 1.400 10/18/2021     ASSESSMENT AND PLAN 72 y.o. year old female  has a past medical history of Allergy, Asthma, Cellulitis (2013), Chronic kidney disease, Cirrhosis (HCC), Common migraine, Complication of anesthesia, Coronary artery disease, DDD (degenerative disc disease), Depression, Diabetes  mellitus, Diabetic peripheral neuropathy (HCC), Difficult airway for intubation (10/20/2022), Difficult intubation (03/03/2023), Diverticulitis, Diverticulosis, DJD (degenerative joint disease), Esophageal varices (HCC),  GERD (gastroesophageal reflux disease), History of colon polyps, History of gastric polyp, Hyperlipidemia, Hypertension, Iron deficiency anemia, Obesity, OSA on CPAP, Peripheral neuropathy, Pneumonia, Primary localized osteoarthritis of left knee (03/27/2019), Sensorineural hearing loss, and 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HCC). here with     ICD-10-CM   1. OSA on CPAP  G47.33 For home use only DME continuous positive airway pressure (CPAP)       Starlit Koenen Merrick is doing well on CPAP therapy. Compliance report reveals excelletn compliance. She was encouraged to continue using CPAP nightly and for greater than 4 hours each night. We will update supply orders as indicated. I will order her a new CPAP. She is aware to listen out for a call to schedule set up with DME. Risks of untreated sleep apnea review and education materials provided. Healthy lifestyle habits encouraged. She will follow up in 31-90 days following set up of new machine. She verbalizes understanding and agreement with this plan.    Orders Placed This Encounter  Procedures   For home use only DME continuous positive airway pressure (CPAP)    New autopap with same settings of minimum pressure 5cmH20, max pressure 20cmH20, EPR 3    Length of Need:   Lifetime    Patient has OSA or probable OSA:   Yes    Is the patient currently using CPAP in the home:   Yes    Settings:   Other see comments    CPAP supplies needed:   Mask, headgear, cushions, filters, heated tubing and water chamber     No orders of the defined types were placed in this encounter.     Shawnie Dapper, FNP-C 08/29/2023, 3:53 PM [facility] [address] [place], [place] [address] [phone]

## 2023-08-29 ENCOUNTER — Encounter: Payer: Self-pay | Admitting: Family Medicine

## 2023-08-29 ENCOUNTER — Other Ambulatory Visit (HOSPITAL_COMMUNITY): Payer: Self-pay

## 2023-08-29 ENCOUNTER — Encounter: Payer: Self-pay | Admitting: Hematology

## 2023-08-29 ENCOUNTER — Ambulatory Visit: Payer: PPO | Admitting: Family Medicine

## 2023-08-29 ENCOUNTER — Other Ambulatory Visit: Payer: Self-pay

## 2023-08-29 VITALS — BP 94/46 | HR 81 | Ht 64.5 in | Wt 209.5 lb

## 2023-08-29 DIAGNOSIS — G4733 Obstructive sleep apnea (adult) (pediatric): Secondary | ICD-10-CM | POA: Diagnosis not present

## 2023-08-29 MED ORDER — ONDANSETRON 4 MG PO TBDP
4.0000 mg | ORAL_TABLET | Freq: Three times a day (TID) | ORAL | 0 refills | Status: DC | PRN
  Filled 2023-08-29: qty 20, 7d supply, fill #0

## 2023-08-30 ENCOUNTER — Other Ambulatory Visit: Payer: Self-pay

## 2023-08-31 ENCOUNTER — Other Ambulatory Visit: Payer: Self-pay

## 2023-09-01 ENCOUNTER — Other Ambulatory Visit: Payer: Self-pay

## 2023-09-01 NOTE — Progress Notes (Signed)
Specialty Pharmacy Refill Coordination Note  Janice [name] is a 72 y.o. female contacted today regarding refills of specialty medication(s) Venetoclax [name])   Patient requested Delivery   Delivery date: 09/15/23   Verified address: [address]  [place] [address]   Medication will be filled on 09/14/23.

## 2023-09-04 ENCOUNTER — Telehealth: Payer: Self-pay | Admitting: Pharmacy Technician

## 2023-09-04 ENCOUNTER — Encounter: Payer: Self-pay | Admitting: Hematology

## 2023-09-04 ENCOUNTER — Other Ambulatory Visit (HOSPITAL_COMMUNITY): Payer: Self-pay

## 2023-09-04 NOTE — Telephone Encounter (Signed)
Oral Oncology Patient Advocate Encounter  Was successful in securing patient a $8,000 grant from [employer] to provide copayment coverage for Venclexta.  This will keep the out of pocket expense at $0.     [employer] ID: [id number]  Final approval is pending diagnosis verification.   The billing information is as follows and has been shared with [facility].    RxBin: [id number] PCN: [id number] Member ID: [id number] Group ID: [id number] Dates of Eligibility: 08/05/23 through 08/03/24  Fund:  CLL  Jinger Neighbors, CPhT-Adv Oncology Pharmacy Patient Advocate [facility] Direct Number: [phone]  Fax: [phone]

## 2023-09-05 ENCOUNTER — Other Ambulatory Visit: Payer: Self-pay

## 2023-09-07 ENCOUNTER — Inpatient Hospital Stay: Payer: PPO

## 2023-09-07 ENCOUNTER — Other Ambulatory Visit (HOSPITAL_COMMUNITY): Payer: Self-pay

## 2023-09-07 ENCOUNTER — Other Ambulatory Visit: Payer: Self-pay

## 2023-09-07 VITALS — BP 115/51 | HR 69 | Temp 99.1°F | Resp 16 | Wt 214.5 lb

## 2023-09-07 DIAGNOSIS — D801 Nonfamilial hypogammaglobulinemia: Secondary | ICD-10-CM

## 2023-09-07 DIAGNOSIS — C8300 Small cell B-cell lymphoma, unspecified site: Secondary | ICD-10-CM

## 2023-09-07 LAB — CMP (CANCER CENTER ONLY)
ALT: 24 U/L (ref 0–44)
AST: 22 U/L (ref 15–41)
Albumin: 4.2 g/dL (ref 3.5–5.0)
Alkaline Phosphatase: 65 U/L (ref 38–126)
Anion gap: 6 (ref 5–15)
BUN: 24 mg/dL — ABNORMAL HIGH (ref 8–23)
CO2: 26 mmol/L (ref 22–32)
Calcium: 9 mg/dL (ref 8.9–10.3)
Chloride: 104 mmol/L (ref 98–111)
Creatinine: 0.95 mg/dL (ref 0.44–1.00)
GFR, Estimated: >60 mL/min (ref >60)
Glucose, Bld: 135 mg/dL — ABNORMAL HIGH (ref 70–99)
Potassium: 4.4 mmol/L (ref 3.5–5.1)
Sodium: 136 mmol/L (ref 135–145)
Total Bilirubin: 0.4 mg/dL (ref <1.2)
Total Protein: 6.6 g/dL (ref 6.5–8.1)

## 2023-09-07 LAB — CBC WITH DIFFERENTIAL (CANCER CENTER ONLY)
Abs Immature Granulocytes: 0.01 10*3/uL (ref 0.00–0.07)
Basophils Absolute: 0 10*3/uL (ref 0.0–0.1)
Basophils Relative: 0 %
Eosinophils Absolute: 0 10*3/uL (ref 0.0–0.5)
Eosinophils Relative: 1 %
HCT: 32.1 % — ABNORMAL LOW (ref 36.0–46.0)
Hemoglobin: 9.6 g/dL — ABNORMAL LOW (ref 12.0–15.0)
Immature Granulocytes: 0 %
Lymphocytes Relative: 17 %
Lymphs Abs: 0.9 10*3/uL (ref 0.7–4.0)
MCH: 24.2 pg — ABNORMAL LOW (ref 26.0–34.0)
MCHC: 29.9 g/dL — ABNORMAL LOW (ref 30.0–36.0)
MCV: 81.1 fL (ref 80.0–100.0)
Monocytes Absolute: 0.6 10*3/uL (ref 0.1–1.0)
Monocytes Relative: 10 %
Neutro Abs: 3.8 10*3/uL (ref 1.7–7.7)
Neutrophils Relative %: 72 %
Platelet Count: 81 10*3/uL — ABNORMAL LOW (ref 150–400)
RBC: 3.96 MIL/uL (ref 3.87–5.11)
RDW: 15.9 % — ABNORMAL HIGH (ref 11.5–15.5)
WBC Count: 5.4 10*3/uL (ref 4.0–10.5)
nRBC: 0 % (ref 0.0–0.2)

## 2023-09-07 LAB — URIC ACID: Uric Acid, Serum: 5.1 mg/dL (ref 2.5–7.1)

## 2023-09-07 LAB — LACTATE DEHYDROGENASE: LDH: 124 U/L (ref 98–192)

## 2023-09-07 MED ORDER — METHYLPREDNISOLONE SODIUM SUCC 40 MG IJ SOLR
40.0000 mg | Freq: Every day | INTRAMUSCULAR | Status: DC
  Administered 2023-09-07: 40 mg via INTRAVENOUS
  Filled 2023-09-07: qty 1

## 2023-09-07 MED ORDER — ONDANSETRON HCL 4 MG/2ML IJ SOLN
4.0000 mg | Freq: Once | INTRAMUSCULAR | Status: AC
Start: 2023-09-07 — End: 2023-09-07
  Administered 2023-09-07: 4 mg via INTRAVENOUS
  Filled 2023-09-07: qty 2

## 2023-09-07 MED ORDER — DEXTROSE 5 % IV SOLN: INTRAVENOUS | Status: DC

## 2023-09-07 MED ORDER — DIPHENHYDRAMINE HCL 25 MG PO CAPS
25.0000 mg | ORAL_CAPSULE | Freq: Once | ORAL | Status: AC
  Administered 2023-09-07: 25 mg via ORAL
  Filled 2023-09-07: qty 1

## 2023-09-07 MED ORDER — IMMUNE GLOBULIN (HUMAN) 10 GM/100ML IV SOLN
400.0000 mg/kg | INTRAVENOUS | Status: DC
  Administered 2023-09-07: 40 g via INTRAVENOUS
  Filled 2023-09-07: qty 400

## 2023-09-07 MED ORDER — ACETAMINOPHEN 325 MG PO TABS
650.0000 mg | ORAL_TABLET | Freq: Once | ORAL | Status: AC
Start: 2023-09-07 — End: 2023-09-07
  Administered 2023-09-07: 650 mg via ORAL
  Filled 2023-09-07: qty 2

## 2023-09-07 NOTE — Patient Instructions (Signed)

## 2023-09-11 ENCOUNTER — Encounter: Payer: Self-pay | Admitting: Hematology

## 2023-09-11 NOTE — Telephone Encounter (Signed)
Oral Oncology Patient Advocate Encounter  Diagnosis verification submitted via online portal.  Janice [name], CPhT-Adv Oncology Pharmacy Patient Advocate [facility] Direct Number: [phone]  Fax: [phone]

## 2023-09-12 ENCOUNTER — Other Ambulatory Visit: Payer: Self-pay

## 2023-09-14 ENCOUNTER — Other Ambulatory Visit (INDEPENDENT_AMBULATORY_CARE_PROVIDER_SITE_OTHER): Payer: PPO

## 2023-09-14 ENCOUNTER — Ambulatory Visit (INDEPENDENT_AMBULATORY_CARE_PROVIDER_SITE_OTHER): Payer: PPO | Admitting: Internal Medicine

## 2023-09-14 ENCOUNTER — Other Ambulatory Visit: Payer: Self-pay

## 2023-09-14 ENCOUNTER — Encounter: Payer: Self-pay | Admitting: Internal Medicine

## 2023-09-14 VITALS — BP 110/54 | HR 73 | Ht 64.5 in | Wt 210.4 lb

## 2023-09-14 DIAGNOSIS — K5792 Diverticulitis of intestine, part unspecified, without perforation or abscess without bleeding: Secondary | ICD-10-CM | POA: Diagnosis not present

## 2023-09-14 DIAGNOSIS — K7460 Unspecified cirrhosis of liver: Secondary | ICD-10-CM

## 2023-09-14 DIAGNOSIS — K7581 Nonalcoholic steatohepatitis (NASH): Secondary | ICD-10-CM

## 2023-09-14 DIAGNOSIS — K649 Unspecified hemorrhoids: Secondary | ICD-10-CM | POA: Diagnosis not present

## 2023-09-14 DIAGNOSIS — T884XXD Failed or difficult intubation, subsequent encounter: Secondary | ICD-10-CM | POA: Diagnosis not present

## 2023-09-14 DIAGNOSIS — Z860101 Personal history of adenomatous and serrated colon polyps: Secondary | ICD-10-CM

## 2023-09-14 DIAGNOSIS — I8510 Secondary esophageal varices without bleeding: Secondary | ICD-10-CM

## 2023-09-14 LAB — PROTIME-INR
INR: 1.1 {ratio} — ABNORMAL HIGH (ref 0.8–1.0)
Prothrombin Time: 12 s (ref 9.6–13.1)

## 2023-09-14 NOTE — Progress Notes (Signed)
 Janice [name] 72 y.o. 1951-02-05 [id number]  Assessment & Plan:   Encounter Diagnoses  Name Primary?   Liver cirrhosis secondary to NASH (nonalcoholic steatohepatitis) (HCC) Yes   Bleeding hemorrhoids    Diverticulitis    Hard to intubate, subsequent encounter    Secondary esophageal varices without bleeding (HCC)    Hx of adenomatous colonic polyps    Cirrhosis appears stable.  She had transient hyponatremia for reasons not entirely clear perhaps related to dehydration with diarrhea she had around the time of diverticulitis, and that has resolved and she is back on her spironolactone .  Will check alpha-fetoprotein and INR today.  Given that she had a CT scan in December I would wait a year before restarting ultrasound on a routine basis which is done every 6 months but when patients have a CT or MRI I think it is reasonable to wait a year for the next imaging of the liver for hepatocellular carcinoma screening.  There is a prior history of possible liver lesions but MRI and PET scanning in 2023 did not reveal those and they thought that might have been related to lymphoma and again none were seen on the recent CT either nor on PET scanning in July 2024.  She will be due for a screening EGD in the summer.  Her procedures need to be at the hospital due to history of difficult intubation.  Depending upon those results consider starting carvedilol  if other providers agree.  She is a little overdue for her recommended surveillance colonoscopy and recently had diverticulitis.  She has intermittent rectal bleeding that is chronic that we attributed to hemorrhoids and it did flare recently.  We discussed repeating a colonoscopy in the near future and she wishes to hold off on that so we will consider it again in the summer time around the time of her EGD.  That too would have to be done at the hospital due to history of difficult intubation.  EGD and colonoscopy reminder/recall June 2025  CC:  Janice Santos, MD  Subjective:  Gastroenterology summary:   MASH (NASH) cirrhosis diagnosed 2021 trace ascites at the time no encephalopathy no varices last EGD 2024-hyperplastic polypoid changes in the antrum as before and grade 1 varices plus portal gastropathy.  On low-dose spironolactone .  She is not immune to hepatitis A or B and has not been vaccinated   Husband died from liver failure due to NASH   Swelling in fluid retention postop after feb 24 hernia repair treated with increase diuretics now back to prior dose US  stable liver cirrhsosi and tr ascites nolesion  CT scanning 08/22/2023 stable cirrhosis no liver lesions   Question of liver lesions-last MRI 02/07/2022 not seen, does have splenomegaly with hypoenhancing splenic lesions stable history of lymphoma with multiple lymph nodes noted on MRI.  PET scan October 2023 without liver lesions   History of adenomatous colonic polyps 2012-2 cm pedunculated tubulovillous adenoma removed, complicated by post polypectomy bleed clipped 01/29/2021 08/01/2016 3 adenomas max 6 mm 06/07/2018 no polyps (iron  deficiency anemia evaluation). Does have diverticulosis   History of ulcerated hyperplastic gastric polyp snared 2019 Iron  deficiency anemia related to this polyp presumably   Umbilical hernia-considering surgical repair Dr. Stevie repaired  10/20/2022, open method  Recurrent diverticulitis-last episode 08/22/2023 confirmed by CT    --------------------------------------------------------------------------------------------- Chief Complaint:  HPI 73 year old white woman with NASH cirrhosis, history of lymphoma and colon polyps with recent episode of diverticulitis diagnosed and treated by primary care as per  CT below.  Prior to that she had a lot of diarrhea which resolved with antibiotic treatment, she also had hyponatremia with sodium in the 120s it sounds like (no records).  Spironolactone  was held hyponatremia resolved diarrhea  has resolved.  She also had difficulty with constipation related to venetoclax  treatment for CLL.  She also continues on IVIG therapy.  The constipation was transient and treated with laxatives and she is improved.  Moving her bowels regularly.  She had some bleeding from her hemorrhoids reported treated with hydrocortisone  suppositories by primary care, when she had the diarrhea.  This is in keeping with prior episodes.  She feels like she is just improving at this point and not inclined to set up a colonoscopy yet.  At abdomen feels swollen some, she says weight has been overall stable, no edema.  CT abdomen pelvis with contrast IMPRESSION: 1. Uncomplicated diverticulitis of the proximal descending colon. 2. Known cirrhosis.  Known lymphadenopathy with history of CLL.     On: 08/22/2023 08:06  Wt Readings from Last 3 Encounters:  09/14/23 210 lb 6 oz (95.4 kg)  09/07/23 214 lb 8 oz (97.3 kg)  08/29/23 209 lb 8 oz (95 kg)     Allergies  Allergen Reactions   Breo Ellipta [Fluticasone  Furoate-Vilanterol] Anaphylaxis and Hives    Other reaction(s): hives  breo ellipta   Gazyva  [Obinutuzumab ] Shortness Of Breath    04/27/23- SOB, Severe back pain/neck pain, HA, flushing and some diaphoresis; see progress note   Iodine  Hives, Swelling and Other (See Comments)    And Betadine /caused hives and swelling   Latex Hives, Itching, Rash and Other (See Comments)    Burning   Tessalon  [Benzonatate ] Anaphylaxis, Hives and Other (See Comments)   Zocor [Simvastatin] Other (See Comments)    Caused neuropathy in arms Neuropathy in arms   Levaquin [Levofloxacin] Other (See Comments)    Joint aches//leg, shoulder pain Joint pain   Penicillins Hives   Povidone-Iodine  Hives   Wellbutrin [Bupropion] Other (See Comments)    Negative thoughts. Crazy thoughts   Amoxicillin Hives    Blisters Did it involve swelling of the face/tongue/throat, SOB, or low BP? Yes Did it involve sudden or severe  rash/hives, skin peeling, or any reaction on the inside of your mouth or nose? Yes Did you need to seek medical attention at a hospital or doctor's office?Unknown When did it last happen? 2019      If all above answers are "NO", may proceed with cephalosporin use.    Chlorhexidine  Itching     Chlorhexidine  Gluconate (CHG) Cloths/topical solution   Diflucan [Fluconazole] Hives and Other (See Comments)    blister   Thalitone  [Chlorthalidone ] Other (See Comments)    Ears ringing    Amoxicillin-Pot Clavulanate Rash   Vibra-Tab [Doxycycline] Diarrhea and Itching   Current Meds  Medication Sig   albuterol  (VENTOLIN  HFA) 108 (90 Base) MCG/ACT inhaler Inhale 1-2 puffs into the lungs every 6 (six) hours as needed (Cough).   allopurinol  (ZYLOPRIM ) 100 MG tablet Take 1 tablet (100 mg) by mouth 2 times daily.   amLODipine  (NORVASC ) 10 MG tablet Take 1 tablet (10 mg total) by mouth daily.   aspirin  EC 81 MG tablet Take 1 tablet (81 mg total) by mouth daily. Swallow whole.   b complex vitamins capsule Take 1 capsule by mouth in the morning.   bacitracin  500 UNIT/GM ointment Apply topically 2 (two) times daily for 10 days   budesonide -formoterol  (SYMBICORT ) 80-4.5 MCG/ACT inhaler Inhale 2  puffs into the lungs daily. (Patient taking differently: Inhale 2 puffs into the lungs 2 (two) times daily. Patient usually does 2 puffs in morning and 1 puff in evening.)   Carboxymethylcellul-Glycerin (REFRESH OPTIVE PF OP) Place 1 drop into both eyes 3 (three) times daily as needed (dry/irritated eyes.). Non-pres   cephALEXin  (KEFLEX ) 500 MG capsule Take 2 capsules (1,000 mg total) by mouth as directed 1 hour prior to dental work.   cetirizine  (ZYRTEC ) 10 MG tablet Take 5 mg by mouth in the morning.   Coenzyme Q10 (CO Q-10) 100 MG CAPS Take 100 mg by mouth in the morning.   Continuous Glucose Sensor (FREESTYLE LIBRE 2 SENSOR) MISC Use to monitor blood glucose continuously - change every 14 days.   desoximetasone   (TOPICORT ) 0.25 % cream Apply a small amount to skin twice a day as needed for flares   diclofenac  Sodium (PENNSAID ) 2 % SOLN Apply 2 Pump (40 mg total) topically up to 4 (four) times daily as needed.   diclofenac  Sodium (VOLTAREN  ARTHRITIS PAIN) 1 % GEL Apply topically 4 (four) times daily to bilateral knees and ankles   diclofenac  sodium (VOLTAREN ) 1 % GEL Apply 2 g topically 2 (two) times daily as needed (knee pain/ foot /Triger finger).   empagliflozin  (JARDIANCE ) 10 MG TABS tablet Take 10 mg by mouth in the morning.   empagliflozin  (JARDIANCE ) 10 MG TABS tablet Take 1 tablet (10 mg total) by mouth daily.   fluticasone  (FLONASE ) 50 MCG/ACT nasal spray Place 2 sprays into both nostrils in the morning. (Patient taking differently: Place 2 sprays into both nostrils daily as needed for allergies.)   furosemide  (LASIX ) 20 MG tablet Take 1 tablet (20 mg total) by mouth daily.   gabapentin  (NEURONTIN ) 300 MG capsule Take 1 capsule by mouth twice a day (Patient taking differently: Take 600 mg by mouth at bedtime.)   glucose blood test strip Use to test blood sugar 3 times daily as directed   hydrALAZINE  (APRESOLINE ) 25 MG tablet Take 1 tablet (25 mg total) by mouth 2 (two) times daily.   hydrocortisone  (ANUSOL -HC) 25 MG suppository Place 1 suppository (25 mg total) rectally daily.   influenza vaccine adjuvanted (FLUAD ) 0.5 ML injection Inject into the muscle.   insulin  aspart (NOVOLOG  FLEXPEN) 100 UNIT/ML FlexPen Inject 30 Units into the skin daily with breakfast AND 30 Units daily with lunch AND 30 Units daily with supper.   insulin  aspart (NOVOLOG  FLEXPEN) 100 UNIT/ML FlexPen Inject 30 Units into the skin daily with breakfast AND 30 Units daily with lunch AND 30 Units daily with supper.   insulin  degludec (TRESIBA  FLEXTOUCH) 200 UNIT/ML FlexTouch Pen Inject 80 Units into the skin daily.   Insulin  Pen Needle 32G X 6 MM MISC Use 6 times daily with insulin  as directed   irbesartan  (AVAPRO ) 300 MG tablet  Take 1 tablet (300 mg total) by mouth in the morning.   Lancets (ONETOUCH DELICA PLUS LANCET33G) MISC Use to test blood sugar 3 times daily   LORazepam  (ATIVAN ) 0.5 MG tablet Take 1-2 tablets (0.5-1 mg total) by mouth once as needed for up to 1 dose (prior to MRI or CT scan for claustrophobia).   metFORMIN  (GLUCOPHAGE -XR) 500 MG 24 hr tablet Take 2 tablets (1,000 mg total) by mouth 2 (two) times daily.   omeprazole  (PRILOSEC) 40 MG capsule Take 1 capsule by mouth twice daily   ondansetron  (ZOFRAN -ODT) 4 MG disintegrating tablet Dissolve 1 tablet (4 mg) by mouth every 8 hours as needed  for nausea or vomiting.   rosuvastatin  (CRESTOR ) 10 MG tablet Take 1 tablet (10 mg total) by mouth daily.   sertraline  (ZOLOFT ) 50 MG tablet Take 1 tablet (50 mg total) by mouth daily. (Patient taking differently: Take 50 mg by mouth every evening.)   spironolactone  (ALDACTONE ) 50 MG tablet Take 1 tablet (50 mg total) by mouth daily.   terconazole (TERAZOL 3) 0.8 % vaginal cream Place 1 applicator vaginally daily as needed (yeast infections).   venetoclax  (VENCLEXTA ) 100 MG tablet Take 1 tablet (100 mg total) by mouth daily. Tablets should be swallowed whole with a meal and a full glass of water .               Past Medical History:  Diagnosis Date   Allergy    Asthma    seasonal   Cellulitis 2013   Left toe   Chronic kidney disease    stage 1   Cirrhosis (HCC)    Common migraine    History of   Complication of anesthesia    Per pt/had breathing problems with block during rotator cuff surgery. 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   Coronary artery disease    DDD (degenerative disc disease)    Depression    denies takes paxil for migraines   Diabetes mellitus    type 2   Diabetic peripheral neuropathy (HCC)    Difficult airway for intubation 10/20/2022   DLx 1 with MAC 4, unable to see cords, DL with lopro S3 glidescope, easy atraumatic intubation   Difficult intubation 03/03/2023    Diverticulitis    Diverticulosis    DJD (degenerative joint disease)    Esophageal varices (HCC)    GERD (gastroesophageal reflux disease)    History of colon polyps    hyperplastic   History of gastric polyp    Hyperlipidemia    Hypertension    Iron  deficiency anemia    Obesity    OSA on CPAP    cpap   Peripheral neuropathy    Pneumonia    april 2020  mild   Primary localized osteoarthritis of left knee 03/27/2019   Sensorineural hearing loss    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HCC)    Past Surgical History:  Procedure Laterality Date   ANKLE SURGERY     Left    BREAST BIOPSY Left 2021   left axilla, lymphoma   COLONOSCOPY  05/2018   ESOPHAGOGASTRODUODENOSCOPY (EGD) WITH PROPOFOL  N/A 03/09/2023   Procedure: ESOPHAGOGASTRODUODENOSCOPY (EGD) WITH PROPOFOL ;  Surgeon: Avram Lupita BRAVO, MD;  Location: [facility];  Service: Gastroenterology;  Laterality: N/A;   LEEP N/A 09/14/2018   Procedure: LOOP ELECTROSURGICAL EXCISION PROCEDURE (LEEP);  Surgeon: Leva Rush, MD;  Location: [facility];  Service: Gynecology;  Laterality: N/A;   MASS EXCISION Right 06/24/2021   Procedure: EXCISION RIGHT POSTERIOR NECK MASS;  Surgeon: Kinsinger, Herlene Righter, MD;  Location: [facility];  Service: General;  Laterality: Right;   ROTATOR CUFF REPAIR Bilateral 2012, 2015   TONSILLECTOMY     TOTAL KNEE ARTHROPLASTY Left 04/08/2019   Procedure: TOTAL KNEE ARTHROPLASTY;  Surgeon: Jane Charleston, MD;  Location: [facility];  Service: Orthopedics;  Laterality: Left;   UMBILICAL HERNIA REPAIR N/A 10/20/2022   Procedure: OPEN HERNIA REPAIR UMBILICAL ADULT with Mesh;  Surgeon: Kinsinger, Herlene Righter, MD;  Location: [facility];  Service: General;  Laterality: N/A;   UPPER GI ENDOSCOPY  05/2018   Social History   Social History Narrative   1 caffeine drink daily  Right-handed   widow (husband died from [place] cirrhosis)   family history includes Bone cancer in her mother; Breast cancer in her mother; Diabetes  Mellitus II in her brother, father, and sister; Heart failure in her father; Hypertension in her brother and father; Obesity in her sister; Other in her sister.   Review of Systems As per HPI  Objective:   Physical Exam BP (!) 110/54 (BP Location: Right Arm, Patient Position: Sitting, Cuff Size: Large)   Pulse 73   Ht 5' 4.5 (1.638 m)   Wt 210 lb 6 oz (95.4 kg)   SpO2 96%   BMI 35.55 kg/m  Obese elderly white woman no acute distress She is alert and oriented x 3 Eyes are anicteric Lungs are clear Heart sounds normal S1-S2 no rubs or gallops The abdomen is obese soft and nontender I do not get a sense of ascites but the obesity makes exam difficult.  She has a small ecchymotic area in the right upper quadrant that she says was related to leaning into a shelf. Extremities Free of edema

## 2023-09-14 NOTE — Patient Instructions (Signed)
 Your provider has requested that you go to the basement level for lab work before leaving today. Press B on the elevator. The lab is located at the first door on the left as you exit the elevator.  Due to recent changes in healthcare laws, you may see the results of your imaging and laboratory studies on MyChart before your provider has had a chance to review them.  We understand that in some cases there may be results that are confusing or concerning to you. Not all laboratory results come back in the same time frame and the provider may be waiting for multiple results in order to interpret others.  Please give us  48 hours in order for your provider to thoroughly review all the results before contacting the office for clarification of your results.   We will place a colonoscopy and EGD recall for 02/2024.   I appreciate the opportunity to care for you. Lupita Commander, MD, [facility]

## 2023-09-18 ENCOUNTER — Other Ambulatory Visit (HOSPITAL_COMMUNITY): Payer: Self-pay

## 2023-09-18 LAB — AFP TUMOR MARKER: AFP-Tumor Marker: 3.9 ng/mL

## 2023-09-20 DIAGNOSIS — D696 Thrombocytopenia, unspecified: Secondary | ICD-10-CM | POA: Diagnosis not present

## 2023-09-20 DIAGNOSIS — I10 Essential (primary) hypertension: Secondary | ICD-10-CM | POA: Diagnosis not present

## 2023-09-20 DIAGNOSIS — R42 Dizziness and giddiness: Secondary | ICD-10-CM | POA: Diagnosis not present

## 2023-09-20 DIAGNOSIS — R296 Repeated falls: Secondary | ICD-10-CM | POA: Diagnosis not present

## 2023-09-20 DIAGNOSIS — D509 Iron deficiency anemia, unspecified: Secondary | ICD-10-CM | POA: Diagnosis not present

## 2023-09-26 ENCOUNTER — Encounter: Payer: Self-pay | Admitting: Hematology

## 2023-09-28 ENCOUNTER — Other Ambulatory Visit (HOSPITAL_COMMUNITY): Payer: Self-pay

## 2023-09-29 ENCOUNTER — Other Ambulatory Visit: Payer: Self-pay

## 2023-09-29 ENCOUNTER — Other Ambulatory Visit (HOSPITAL_COMMUNITY): Payer: Self-pay

## 2023-09-29 MED ORDER — GABAPENTIN 300 MG PO CAPS
ORAL_CAPSULE | ORAL | 1 refills | Status: DC
  Filled 2023-09-29: qty 180, 60d supply, fill #0

## 2023-09-29 MED ORDER — OMEPRAZOLE 40 MG PO CPDR
40.0000 mg | DELAYED_RELEASE_CAPSULE | Freq: Two times a day (BID) | ORAL | 3 refills | Status: DC
  Filled 2023-09-29: qty 180, 90d supply, fill #0
  Filled 2023-12-26: qty 180, 90d supply, fill #1
  Filled 2024-03-30: qty 180, 90d supply, fill #2
  Filled 2024-06-25: qty 180, 90d supply, fill #3

## 2023-10-02 ENCOUNTER — Other Ambulatory Visit: Payer: Self-pay

## 2023-10-02 DIAGNOSIS — C9110 Chronic lymphocytic leukemia of B-cell type not having achieved remission: Secondary | ICD-10-CM

## 2023-10-02 DIAGNOSIS — D801 Nonfamilial hypogammaglobulinemia: Secondary | ICD-10-CM

## 2023-10-03 ENCOUNTER — Inpatient Hospital Stay: Payer: PPO | Attending: Hematology

## 2023-10-03 ENCOUNTER — Inpatient Hospital Stay: Payer: PPO | Attending: Hematology | Admitting: Hematology

## 2023-10-03 ENCOUNTER — Inpatient Hospital Stay: Payer: PPO

## 2023-10-03 VITALS — BP 122/44 | HR 69 | Temp 98.0°F | Resp 18

## 2023-10-03 VITALS — BP 122/46 | HR 72 | Temp 98.4°F | Resp 18 | Wt 210.5 lb

## 2023-10-03 DIAGNOSIS — C8300 Small cell B-cell lymphoma, unspecified site: Secondary | ICD-10-CM

## 2023-10-03 DIAGNOSIS — Z79899 Other long term (current) drug therapy: Secondary | ICD-10-CM | POA: Diagnosis not present

## 2023-10-03 DIAGNOSIS — D801 Nonfamilial hypogammaglobulinemia: Secondary | ICD-10-CM | POA: Insufficient documentation

## 2023-10-03 DIAGNOSIS — D509 Iron deficiency anemia, unspecified: Secondary | ICD-10-CM | POA: Diagnosis not present

## 2023-10-03 DIAGNOSIS — D696 Thrombocytopenia, unspecified: Secondary | ICD-10-CM | POA: Diagnosis not present

## 2023-10-03 DIAGNOSIS — C9110 Chronic lymphocytic leukemia of B-cell type not having achieved remission: Secondary | ICD-10-CM

## 2023-10-03 DIAGNOSIS — Z7982 Long term (current) use of aspirin: Secondary | ICD-10-CM | POA: Insufficient documentation

## 2023-10-03 DIAGNOSIS — Z7951 Long term (current) use of inhaled steroids: Secondary | ICD-10-CM | POA: Insufficient documentation

## 2023-10-03 DIAGNOSIS — Z7969 Long term (current) use of other immunomodulators and immunosuppressants: Secondary | ICD-10-CM | POA: Insufficient documentation

## 2023-10-03 LAB — CMP (CANCER CENTER ONLY)
ALT: 20 U/L (ref 0–44)
AST: 18 U/L (ref 15–41)
Albumin: 4.4 g/dL (ref 3.5–5.0)
Alkaline Phosphatase: 88 U/L (ref 38–126)
Anion gap: 8 (ref 5–15)
BUN: 20 mg/dL (ref 8–23)
CO2: 23 mmol/L (ref 22–32)
Calcium: 9 mg/dL (ref 8.9–10.3)
Chloride: 107 mmol/L (ref 98–111)
Creatinine: 0.83 mg/dL (ref 0.44–1.00)
GFR, Estimated: >60 mL/min (ref >60)
Glucose, Bld: 149 mg/dL — ABNORMAL HIGH (ref 70–99)
Potassium: 4.6 mmol/L (ref 3.5–5.1)
Sodium: 138 mmol/L (ref 135–145)
Total Bilirubin: 0.5 mg/dL (ref 0.0–1.2)
Total Protein: 6.7 g/dL (ref 6.5–8.1)

## 2023-10-03 LAB — CBC WITH DIFFERENTIAL (CANCER CENTER ONLY)
Abs Immature Granulocytes: 0.02 10*3/uL (ref 0.00–0.07)
Basophils Absolute: 0 10*3/uL (ref 0.0–0.1)
Basophils Relative: 1 %
Eosinophils Absolute: 0.1 10*3/uL (ref 0.0–0.5)
Eosinophils Relative: 1 %
HCT: 33.4 % — ABNORMAL LOW (ref 36.0–46.0)
Hemoglobin: 9.9 g/dL — ABNORMAL LOW (ref 12.0–15.0)
Immature Granulocytes: 0 %
Lymphocytes Relative: 18 %
Lymphs Abs: 0.8 10*3/uL (ref 0.7–4.0)
MCH: 23.1 pg — ABNORMAL LOW (ref 26.0–34.0)
MCHC: 29.6 g/dL — ABNORMAL LOW (ref 30.0–36.0)
MCV: 77.9 fL — ABNORMAL LOW (ref 80.0–100.0)
Monocytes Absolute: 0.4 10*3/uL (ref 0.1–1.0)
Monocytes Relative: 9 %
Neutro Abs: 3.3 10*3/uL (ref 1.7–7.7)
Neutrophils Relative %: 71 %
Platelet Count: 67 10*3/uL — ABNORMAL LOW (ref 150–400)
RBC: 4.29 MIL/uL (ref 3.87–5.11)
RDW: 16.4 % — ABNORMAL HIGH (ref 11.5–15.5)
WBC Count: 4.6 10*3/uL (ref 4.0–10.5)
nRBC: 0 % (ref 0.0–0.2)

## 2023-10-03 LAB — IRON AND IRON BINDING CAPACITY (CC-WL,HP ONLY)
Iron: 29 ug/dL (ref 28–170)
Saturation Ratios: 6 % — ABNORMAL LOW (ref 10.4–31.8)
TIBC: 501 ug/dL — ABNORMAL HIGH (ref 250–450)
UIBC: 472 ug/dL — ABNORMAL HIGH (ref 148–442)

## 2023-10-03 LAB — URIC ACID: Uric Acid, Serum: 5.1 mg/dL (ref 2.5–7.1)

## 2023-10-03 LAB — LACTATE DEHYDROGENASE: LDH: 128 U/L (ref 98–192)

## 2023-10-03 LAB — FERRITIN: Ferritin: 7 ng/mL — ABNORMAL LOW (ref 11–307)

## 2023-10-03 MED ORDER — DIPHENHYDRAMINE HCL 25 MG PO CAPS
25.0000 mg | ORAL_CAPSULE | Freq: Once | ORAL | Status: AC
  Administered 2023-10-03: 25 mg via ORAL
  Filled 2023-10-03: qty 1

## 2023-10-03 MED ORDER — ONDANSETRON HCL 4 MG/2ML IJ SOLN
4.0000 mg | Freq: Once | INTRAMUSCULAR | Status: AC
Start: 2023-10-03 — End: 2023-10-03
  Administered 2023-10-03: 4 mg via INTRAVENOUS
  Filled 2023-10-03: qty 2

## 2023-10-03 MED ORDER — ACETAMINOPHEN 325 MG PO TABS
650.0000 mg | ORAL_TABLET | Freq: Once | ORAL | Status: AC
  Administered 2023-10-03: 650 mg via ORAL
  Filled 2023-10-03: qty 2

## 2023-10-03 MED ORDER — IMMUNE GLOBULIN (HUMAN) 10 GM/100ML IV SOLN
400.0000 mg/kg | INTRAVENOUS | Status: DC
  Administered 2023-10-03: 40 g via INTRAVENOUS
  Filled 2023-10-03: qty 400

## 2023-10-03 MED ORDER — METHYLPREDNISOLONE SODIUM SUCC 40 MG IJ SOLR
40.0000 mg | Freq: Every day | INTRAMUSCULAR | Status: DC
Start: 2023-10-03 — End: 2023-10-03
  Administered 2023-10-03: 40 mg via INTRAVENOUS
  Filled 2023-10-03: qty 1

## 2023-10-03 NOTE — Progress Notes (Signed)
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  INTERVAL HISTORY:  Janice [name] is a 73 y.o. 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  I last connected with the patient on 07/26/2023 and she complained of nausea after her last IVIG, increased cognitive issues, occasional night sweats.  Patient notes she has been doing fairly well since our last visit. She noticed enlarged lymph nodes on her left shoulder last week, but she notes that this disappeared around 2 days ago. She also noticed 2 other enlarged lymph nodes around her neck since our last visit. However, she does not notice any enlarged lymph nodes during this visit.   Patient reports noticing small amount of bright red blood on her tissue due to her hemorrhoids. She notes that she had acute Diverticulitis around mid-December and was prescribed antibiotics. She denies any rectal bleeding during this visit. Patient notes that her Gastroenterologist recommended another colonoscopy, but patient denied. Patient denies black stool or blood in stool during this visit.   She has been taking 100 mg Venetoclax and has been tolerating it well without any new or severe toxicities. She notes she has been tolerating her IVIG infusions well without any new or severe toxicities. She does report being lethargic one day after receiving IVIG infusion.   She denies any new infection issues, fever, chills, night sweats, unexpected weight loss, back pain, chest pain, abdominal pain, SOB, or leg swelling. During physical exam, she complained  of left lower abdominal tenderness upon touch.   Patient reports of having headaches, which have resolved.   Patient notes she had 3 falls in the past 3 months. She falls when she tries to turn while walking. She denies being dizzy when falling. Patient notes that all her recent falls were mechanical falls. According to the patient, her PCP thinks her falls are due to low blood pressure. During this visit, her blood pressure is 122/46 with heart rate of 72. She denies using walker or walking cane.   She continues to take B-complex and vitamin-D. Denies iron supplement.   MEDICAL HISTORY:  Past Medical History:  Diagnosis Date   Allergy    Asthma    seasonal   Cellulitis 2013   Left toe   Chronic kidney disease    stage 1   Cirrhosis (HCC)    Common migraine    History of   Complication of anesthesia    Per pt/had breathing problems with "block" during rotator cuff surgery. 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   Coronary artery disease    DDD (degenerative disc disease)    Depression    denies takes paxil for migraines   Diabetes mellitus    type 2   Diabetic peripheral neuropathy (HCC)    Difficult airway for intubation 10/20/2022   DLx 1 with MAC 4, unable to see cords, DL with lopro S3 glidescope, easy atraumatic intubation   Difficult intubation 03/03/2023   Diverticulitis    Diverticulosis    DJD (degenerative joint disease)    Esophageal varices (HCC)    GERD (gastroesophageal reflux disease)    History of colon polyps    hyperplastic   History  of gastric polyp    Hyperlipidemia    Hypertension    Iron deficiency anemia    Obesity    OSA on CPAP    cpap   Peripheral neuropathy    Pneumonia    april 2020  mild   Primary localized osteoarthritis of left knee 03/27/2019   Sensorineural hearing loss    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HCC)     SURGICAL HISTORY: Past Surgical History:  Procedure Laterality Date   ANKLE SURGERY     Left    BREAST BIOPSY Left  2021   left axilla, lymphoma   COLONOSCOPY  05/2018   ESOPHAGOGASTRODUODENOSCOPY (EGD) WITH PROPOFOL N/A 03/09/2023   Procedure: ESOPHAGOGASTRODUODENOSCOPY (EGD) WITH PROPOFOL;  Surgeon: Iva Boop, MD;  Location: [facility];  Service: Gastroenterology;  Laterality: N/A;   LEEP N/A 09/14/2018   Procedure: LOOP ELECTROSURGICAL EXCISION PROCEDURE (LEEP);  Surgeon: Richardean Chimera, MD;  Location: [facility];  Service: Gynecology;  Laterality: N/A;   MASS EXCISION Right 06/24/2021   Procedure: EXCISION RIGHT POSTERIOR NECK MASS;  Surgeon: Kinsinger, De Blanch, MD;  Location: [facility];  Service: General;  Laterality: Right;   ROTATOR CUFF REPAIR Bilateral 2012, 2015   TONSILLECTOMY     TOTAL KNEE ARTHROPLASTY Left 04/08/2019   Procedure: TOTAL KNEE ARTHROPLASTY;  Surgeon: Salvatore Marvel, MD;  Location: [facility];  Service: Orthopedics;  Laterality: Left;   UMBILICAL HERNIA REPAIR N/A 10/20/2022   Procedure: OPEN HERNIA REPAIR UMBILICAL ADULT with Mesh;  Surgeon: Kinsinger, De Blanch, MD;  Location: [facility];  Service: General;  Laterality: N/A;   UPPER GI ENDOSCOPY  05/2018    SOCIAL HISTORY: Social History   Socioeconomic History   Marital status: Widowed    Spouse name: Not on file   Number of children: 0   Years of education: 3 years college   Highest education level: Not on file  Occupational History   Occupation: Unemployed  Tobacco Use   Smoking status: Never    Passive exposure: Past   Smokeless tobacco: Never  Vaping Use   Vaping status: Never Used  Substance and Sexual Activity   Alcohol use: Not Currently    Comment: very rare   Drug use: No   Sexual activity: Not Currently    Birth control/protection: None, Post-menopausal  Other Topics Concern   Not on file  Social History Narrative   1 caffeine drink daily    Right-handed   widow (husband died from [place] cirrhosis)   Social Drivers of Health   Financial Resource Strain: Medium Risk (08/20/2020)    Overall Financial Resource Strain (CARDIA)    Difficulty of Paying Living Expenses: Somewhat hard  Food Insecurity: No Food Insecurity (10/20/2022)   Hunger Vital Sign    Worried About Running Out of Food in the Last Year: Never true    Ran Out of Food in the Last Year: Never true  Transportation Needs: No Transportation Needs (10/20/2022)   PRAPARE - Administrator, Civil Service (Medical): No    Lack of Transportation (Non-Medical): No  Physical Activity: Not on file  Stress: Not on file  Social Connections: Unknown (01/25/2022)   [facility] from [facility]   Social Network    Social Network: Not on file  Intimate Partner Violence: Not At Risk (10/20/2022)   Humiliation, Afraid, Rape, and Kick questionnaire    Fear of Current or Ex-Partner: No    Emotionally Abused: No    Physically Abused: No  Sexually Abused: No    FAMILY HISTORY: Family History  Problem Relation Age of Onset   Breast cancer Mother    Bone cancer Mother    Heart failure Father    Diabetes Mellitus II Father    Hypertension Father    Diabetes Mellitus II Sister    Obesity Sister    Other Sister        retina problem   Hypertension Brother    Diabetes Mellitus II Brother    Colon cancer Neg Hx    Rectal cancer Neg Hx    Stomach cancer Neg Hx    Esophageal cancer Neg Hx    Pancreatic cancer Neg Hx    Liver disease Neg Hx     ALLERGIES:  is allergic to breo ellipta [fluticasone furoate-vilanterol], gazyva [obinutuzumab], iodine, latex, tessalon [benzonatate], zocor [simvastatin], levaquin [levofloxacin], penicillins, povidone-iodine, wellbutrin [bupropion], amoxicillin, chlorhexidine, diflucan [fluconazole], thalitone [chlorthalidone], amoxicillin-pot clavulanate, and vibra-tab [doxycycline].  MEDICATIONS:  Current Outpatient Medications  Medication Sig Dispense Refill   albuterol (VENTOLIN HFA) 108 (90 Base) MCG/ACT inhaler Inhale 1-2 puffs into the lungs every 6 (six) hours  as needed (Cough). 18 g 0   allopurinol (ZYLOPRIM) 100 MG tablet Take 1 tablet (100 mg) by mouth 2 times daily. 60 tablet 1   amLODipine (NORVASC) 10 MG tablet Take 1 tablet (10 mg total) by mouth daily. 90 tablet 3   aspirin EC 81 MG tablet Take 1 tablet (81 mg total) by mouth daily. Swallow whole. 90 tablet 3   azelastine (ASTELIN) 0.1 % nasal spray Place 1 spray into both nostrils 2 (two) times daily. Use in each nostril as directed 9 mL 11   b complex vitamins capsule Take 1 capsule by mouth in the morning.     bacitracin 500 UNIT/GM ointment Apply topically 2 (two) times daily for 10 days 30 g 0   budesonide-formoterol (SYMBICORT) 80-4.5 MCG/ACT inhaler Inhale 2 puffs into the lungs daily. (Patient taking differently: Inhale 2 puffs into the lungs 2 (two) times daily. Patient usually does 2 puffs in morning and 1 puff in evening.) 10.2 g 12   Carboxymethylcellul-Glycerin (REFRESH OPTIVE PF OP) Place 1 drop into both eyes 3 (three) times daily as needed (dry/irritated eyes.). Non-pres     cephALEXin (KEFLEX) 500 MG capsule Take 2 capsules (1,000 mg total) by mouth as directed 1 hour prior to dental work. 20 capsule 0   cetirizine (ZYRTEC) 10 MG tablet Take 5 mg by mouth in the morning.     Coenzyme Q10 (CO Q-10) 100 MG CAPS Take 100 mg by mouth in the morning.     Continuous Glucose Sensor (FREESTYLE LIBRE 2 SENSOR) MISC Use to monitor blood glucose continuously - change every 14 days. 6 each 3   desoximetasone (TOPICORT) 0.25 % cream Apply a small amount to skin twice a day as needed for flares 60 g 0   diclofenac Sodium (PENNSAID) 2 % SOLN Apply 2 Pump (40 mg total) topically up to 4 (four) times daily as needed. 112 g 5   diclofenac Sodium (VOLTAREN ARTHRITIS PAIN) 1 % GEL Apply topically 4 (four) times daily to bilateral knees and ankles 200 g 5   diclofenac sodium (VOLTAREN) 1 % GEL Apply 2 g topically 2 (two) times daily as needed (knee pain/ foot /Triger finger).  2   empagliflozin  (JARDIANCE) 10 MG TABS tablet Take 10 mg by mouth in the morning.     empagliflozin (JARDIANCE) 10 MG TABS tablet Take 1  tablet (10 mg total) by mouth daily. 90 tablet 3   fluticasone (FLONASE) 50 MCG/ACT nasal spray Place 2 sprays into both nostrils in the morning. (Patient taking differently: Place 2 sprays into both nostrils daily as needed for allergies.) 48 g 2   furosemide (LASIX) 20 MG tablet Take 1 tablet (20 mg total) by mouth daily. 90 tablet 3   gabapentin (NEURONTIN) 300 MG capsule Take 1 capsule (300 mg total) by mouth every morning AND 2 capsules (600 mg total) at bedtime. 180 capsule 1   glucose blood test strip Use to test blood sugar 3 times daily as directed 300 each 3   hydrALAZINE (APRESOLINE) 25 MG tablet Take 1 tablet (25 mg total) by mouth 2 (two) times daily. 180 tablet 2   hydrocortisone (ANUSOL-HC) 25 MG suppository Place 1 suppository (25 mg total) rectally daily. 30 suppository 0   influenza vaccine adjuvanted (FLUAD) 0.5 ML injection Inject into the muscle. 0.5 mL 0   insulin aspart (NOVOLOG FLEXPEN) 100 UNIT/ML FlexPen Inject 30 Units into the skin daily with breakfast AND 30 Units daily with lunch AND 30 Units daily with supper. 90 mL 3   insulin aspart (NOVOLOG FLEXPEN) 100 UNIT/ML FlexPen Inject 30 Units into the skin daily with breakfast AND 30 Units daily with lunch AND 30 Units daily with supper. 45 mL 3   insulin degludec (TRESIBA FLEXTOUCH) 200 UNIT/ML FlexTouch Pen Inject 80 Units into the skin daily. 30 mL 11   Insulin Pen Needle 32G X 6 MM MISC Use 6 times daily with insulin as directed 400 each 3   irbesartan (AVAPRO) 300 MG tablet Take 1 tablet (300 mg total) by mouth in the morning. 90 tablet 3   Lancets (ONETOUCH DELICA PLUS LANCET33G) MISC Use to test blood sugar 3 times daily 300 each 2   LORazepam (ATIVAN) 0.5 MG tablet Take 1-2 tablets (0.5-1 mg total) by mouth once as needed for up to 1 dose (prior to MRI or CT scan for claustrophobia). 4 tablet 0    metFORMIN (GLUCOPHAGE-XR) 500 MG 24 hr tablet Take 2 tablets (1,000 mg total) by mouth 2 (two) times daily. 360 tablet 3   omeprazole (PRILOSEC) 40 MG capsule Take 1 capsule (40 mg total) by mouth 2 (two) times daily. 180 capsule 3   ondansetron (ZOFRAN-ODT) 4 MG disintegrating tablet Dissolve 1 tablet (4 mg) by mouth every 8 hours as needed for nausea or vomiting. 20 tablet 0   rosuvastatin (CRESTOR) 10 MG tablet Take 1 tablet (10 mg total) by mouth daily. 90 tablet 3   sertraline (ZOLOFT) 50 MG tablet Take 1 tablet (50 mg total) by mouth daily. (Patient taking differently: Take 50 mg by mouth every evening.) 90 tablet 3   spironolactone (ALDACTONE) 50 MG tablet Take 1 tablet (50 mg total) by mouth daily. 90 tablet 3   terconazole (TERAZOL 3) 0.8 % vaginal cream Place 1 applicator vaginally daily as needed (yeast infections).     venetoclax (VENCLEXTA) 100 MG tablet Take 1 tablet (100 mg total) by mouth daily. Tablets should be swallowed whole with a meal and a full glass of water. 30 tablet 3   No current facility-administered medications for this visit.    REVIEW OF SYSTEMS:    10 Point review of Systems was done is negative except as noted above.   PHYSICAL EXAMINATION:  .BP (!) 122/46 (BP Location: Left Arm, Patient Position: Sitting)   Pulse 72   Temp 98.4 F (36.9 C) (Temporal)  Resp 18   Wt 210 lb 8 oz (95.5 kg)   SpO2 99%   BMI 35.57 kg/m  . GENERAL:alert, in no acute distress and comfortable SKIN: no acute rashes, no significant lesions EYES: conjunctiva are pink and non-injected, sclera anicteric OROPHARYNX: MMM, no exudates, no oropharyngeal erythema or ulceration NECK: supple, no JVD LYMPH:  no palpable lymphadenopathy in the cervical, axillary or inguinal regions LUNGS: clear to auscultation b/l with normal respiratory effort HEART: regular rate & rhythm ABDOMEN:  normoactive bowel sounds , non tender, not distended. Extremity: no pedal edema PSYCH: alert &  oriented x 3 with fluent speech NEURO: no focal motor/sensory deficits   LABORATORY DATA:  I have reviewed the data as listed  .    Latest Ref Rng & Units 10/03/2023   12:22 PM 09/07/2023   12:07 PM 08/14/2023   12:21 PM  CBC  WBC 4.0 - 10.5 K/uL 4.6  5.4  7.0   Hemoglobin 12.0 - 15.0 g/dL 9.9  9.6  16.1   Hematocrit 36.0 - 46.0 % 33.4  32.1  35.9   Platelets 150 - 400 K/uL 67  81  87    . CBC    Component Value Date/Time   WBC 4.6 10/03/2023 1222   WBC 56.9 (HH) 10/20/2022 1148   RBC 4.29 10/03/2023 1222   HGB 9.9 (L) 10/03/2023 1222   HGB 10.3 (L) 10/18/2021 1222   HCT 33.4 (L) 10/03/2023 1222   HCT 33.0 (L) 10/18/2021 1222   PLT 67 (L) 10/03/2023 1222   PLT 100 (LL) 10/18/2021 1222   MCV 77.9 (L) 10/03/2023 1222   MCV 84 10/18/2021 1222   MCH 23.1 (L) 10/03/2023 1222   MCHC 29.6 (L) 10/03/2023 1222   RDW 16.4 (H) 10/03/2023 1222   RDW 15.1 10/18/2021 1222   LYMPHSABS 0.8 10/03/2023 1222   MONOABS 0.4 10/03/2023 1222   EOSABS 0.1 10/03/2023 1222   BASOSABS 0.0 10/03/2023 1222     .    Latest Ref Rng & Units 10/03/2023   12:22 PM 09/07/2023   12:07 PM 08/14/2023   12:21 PM  CMP  Glucose 70 - 99 mg/dL 096  045  409   BUN 8 - 23 mg/dL 20  24  30    Creatinine 0.44 - 1.00 mg/dL 8.11  9.14  7.82   Sodium 135 - 145 mmol/L 138  136  132   Potassium 3.5 - 5.1 mmol/L 4.6  4.4  4.9   Chloride 98 - 111 mmol/L 107  104  103   CO2 22 - 32 mmol/L 23  26  22    Calcium 8.9 - 10.3 mg/dL 9.0  9.0  9.4   Total Protein 6.5 - 8.1 g/dL 6.7  6.6  6.8   Total Bilirubin 0.0 - 1.2 mg/dL 0.5  0.4  0.6   Alkaline Phos 38 - 126 U/L 88  65  95   AST 15 - 41 U/L 18  22  19    ALT 0 - 44 U/L 20  24  25     . Lab Results  Component Value Date   LDH 124 09/07/2023   . Lab Results  Component Value Date   IRON 29 10/03/2023   TIBC 501 (H) 10/03/2023   IRONPCTSAT 6 (L) 10/03/2023   (Iron and TIBC)  Lab Results  Component Value Date   FERRITIN 7 (L) 10/03/2023    04/08/2020  FISH/CLL Panel:   03/30/2020 Left Axilla Flow Pathology Report [id number]):   03/30/2020  Left axilla lymph node Bx ([id number]):   RADIOGRAPHIC STUDIES: I have personally reviewed the radiological images as listed and agreed with the findings in the report. No results found.  MRI abdomen with and without contrast 07/08/2021 showed IMPRESSION: 1. Moderate motion and patient body habitus degraded exam. 2. Extensive abdominal adenopathy, similar to slightly increased compared to 05/01/2020 PET. 3. Cirrhosis and hepatosplenomegaly with suspicion of periesophageal varices. 4. Nonspecific hypoenhancing or nonenhancing splenic lesions. These could simply represent Hessie Knows bodies in the setting of cirrhosis. Lymphomatous involvement cannot be excluded. 5. Heterogeneous enhancement throughout the liver, without well-defined suspicious mass. Possibly due to altered perfusion in the setting of cirrhosis and porta hepatis adenopathy. Lymphomatous involvement cannot be excluded. Consider pre and post-contrast MRI follow-up at 2-3 months. A more aggressive approach would include random liver biopsy.  MRI abdomen with and without contrast 02/04/2022 showed IMPRESSION: 1. Examination is generally limited by breath motion artifact, particularly multiphasic contrast enhanced sequences. Within this limitation, cirrhotic morphology of the liver without focal liver lesion. 2. 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 3. Trace ascites. 4. Numerous enlarged gastrohepatic ligament, portacaval, retroperitoneal, and small bowel mesenteric lymph nodes are unchanged, in keeping with history of lymphoma.  ASSESSMENT & PLAN:   73 yo female with   1) CLL/SLL -02/28/2020 MM Breast ([id number]) revealed "Bilateral axillary adenopathy." -03/30/2020 Left axilla lymph node biopsy ([id number])  revealed "CHRONIC LYMPHOCYTIC LYMPHOMA/SMALL LYMPHOCYTIC LYMPHOMA".  -03/30/2020 Left Axilla Flow Pathology Report [id number]) revealed "Monoclonal B-cell population with coexpression of CD5 comprises 80% of all lymphocytes". -04/20/20 PET/CT revealed 1. Adenopathy within the neck, chest, abdomen, and pelvis, consistent with active lymphoma. Much of this is not significantly hypermetabolic. Some upper abdominal nodes are moderately hypermetabolic.  2) Mild thrombocytopenia Most likely related to advanced liver cirrhosis and partly could also be from CLL  #3 severe hypogammaglobinemia due to CLL IgG level 272 Component     Latest Ref Rng 09/20/2022  IgG (Immunoglobin G), Serum     586 - 1,602 mg/dL 413 (L)   IgA     64 - 422 mg/dL 15 (L)   IgM (Immunoglobulin M), Srm     26 - 217 mg/dL <5 (L)     Legend: (L) Low  #4 iron deficiency Anemia-- from Epistaxis + ?GI losses. NO overt GI bleeding noted. . Lab Results  Component Value Date   IRON 110 05/17/2023   TIBC 449 05/17/2023   IRONPCTSAT 25 05/17/2023   (Iron and TIBC)  Lab Results  Component Value Date   FERRITIN 7 (L) 10/03/2023    PLAN:  -Discussed lab results from 10/03/2023 in detail with the patient. CBC shows patent is anemic with hgb of 9.9 g/dL with hematocrit of 24.4% and low platelets of 67 K. CMP pending.  -Discussed with the patient that we will add iron panel to today's lab. If pt is iron-deficient, she can receive IV Iron infusion. Pt agrees.  -continue 100 MG venetoclax po daily at this time -continue IVIG every 4 weeks to prevent frequent infections -will hold allopurinol -answered all of patient's questions in detail  -Continue to follow-up with PCP regarding blood pressure medication, recent falls, and possible referral to physical therapy.   FOLLOW-UP: RTC with Dr Candise Che with labs in 2 months -continue monthly IVG x 4  The total time spent in the appointment was 30 minutes* .  All of the patient's  questions were answered with  apparent satisfaction. The patient knows to call the clinic with any problems, questions or concerns.   Wyvonnia Lora MD MS AAHIVMS [facility] Hematology/Oncology Physician [facility]  .*Total Encounter Time as defined by the Centers for Medicare and Medicaid Services includes, in addition to the face-to-face time of a patient visit (documented in the note above) non-face-to-face time: obtaining and reviewing outside history, ordering and reviewing medications, tests or procedures, care coordination (communications with other health care professionals or caregivers) and documentation in the medical record.   I,Param Shah,acting as a Neurosurgeon for Wyvonnia Lora, MD.,have documented all relevant documentation on the behalf of Wyvonnia Lora, MD,as directed by  Wyvonnia Lora, MD while in the presence of Wyvonnia Lora, MD.   .I have reviewed the above documentation for accuracy and completeness, and I agree with the above. Johney Maine MD

## 2023-10-03 NOTE — Progress Notes (Signed)
Patient seen by Dr. Addison Naegeli are within treatment parameters.  Labs reviewed: and are not all within treatment parameters.    Dr Candise Che aware Plts: 67,   Per physician team, patient is ready for treatment and there are NO modifications to the treatment plan.

## 2023-10-03 NOTE — Progress Notes (Signed)
Patient declined 30 minutes post observation following IVIG. VSS at discharge, pt discharged to lobby ambulatory with no complaints.

## 2023-10-05 ENCOUNTER — Ambulatory Visit: Payer: PPO

## 2023-10-05 ENCOUNTER — Other Ambulatory Visit: Payer: PPO

## 2023-10-09 ENCOUNTER — Other Ambulatory Visit: Payer: Self-pay

## 2023-10-09 ENCOUNTER — Other Ambulatory Visit (HOSPITAL_COMMUNITY): Payer: Self-pay

## 2023-10-09 ENCOUNTER — Encounter: Payer: Self-pay | Admitting: Hematology

## 2023-10-10 ENCOUNTER — Other Ambulatory Visit (HOSPITAL_COMMUNITY): Payer: Self-pay | Admitting: Pharmacy Technician

## 2023-10-10 ENCOUNTER — Other Ambulatory Visit: Payer: Self-pay

## 2023-10-10 ENCOUNTER — Other Ambulatory Visit (HOSPITAL_COMMUNITY): Payer: Self-pay

## 2023-10-10 NOTE — Progress Notes (Signed)
Specialty Pharmacy Refill Coordination Note  Janice [name] is a 73 y.o. female contacted today regarding refills of specialty medication(s) Venetoclax [name])   Patient requested No data recorded  Delivery date: 10/17/23   Verified address: Patient address [id number]  [place]   Medication will be filled on 10/16/23.

## 2023-10-12 ENCOUNTER — Encounter: Payer: Self-pay | Admitting: Hematology

## 2023-10-12 ENCOUNTER — Other Ambulatory Visit (HOSPITAL_COMMUNITY): Payer: Self-pay

## 2023-10-12 ENCOUNTER — Telehealth: Payer: Self-pay | Admitting: Family Medicine

## 2023-10-12 ENCOUNTER — Other Ambulatory Visit: Payer: Self-pay

## 2023-10-12 NOTE — Telephone Encounter (Signed)
Pt has been scheduled for her initial CPAP f/u, pt aware to bring CPAP and power cord

## 2023-10-13 NOTE — Telephone Encounter (Signed)
730 lab and 830 infusion is all we have that day. Lorayne Marek, RN

## 2023-10-16 ENCOUNTER — Other Ambulatory Visit: Payer: Self-pay

## 2023-10-19 ENCOUNTER — Other Ambulatory Visit: Payer: Self-pay | Admitting: Hematology

## 2023-10-19 ENCOUNTER — Other Ambulatory Visit (HOSPITAL_COMMUNITY): Payer: Self-pay

## 2023-10-19 DIAGNOSIS — C9110 Chronic lymphocytic leukemia of B-cell type not having achieved remission: Secondary | ICD-10-CM

## 2023-10-19 MED ORDER — AMLODIPINE BESYLATE 10 MG PO TABS
10.0000 mg | ORAL_TABLET | Freq: Every day | ORAL | 0 refills | Status: DC
  Filled 2023-10-19: qty 90, 90d supply, fill #0

## 2023-10-19 MED ORDER — ALLOPURINOL 100 MG PO TABS
100.0000 mg | ORAL_TABLET | Freq: Two times a day (BID) | ORAL | 1 refills | Status: DC
  Filled 2023-10-19: qty 60, 30d supply, fill #0
  Filled 2023-11-22: qty 60, 30d supply, fill #1

## 2023-10-20 ENCOUNTER — Other Ambulatory Visit: Payer: Self-pay

## 2023-10-20 ENCOUNTER — Other Ambulatory Visit (HOSPITAL_COMMUNITY): Payer: Self-pay

## 2023-10-24 ENCOUNTER — Other Ambulatory Visit (HOSPITAL_COMMUNITY): Payer: Self-pay

## 2023-10-24 ENCOUNTER — Other Ambulatory Visit: Payer: Self-pay

## 2023-10-24 DIAGNOSIS — E119 Type 2 diabetes mellitus without complications: Secondary | ICD-10-CM | POA: Diagnosis not present

## 2023-10-24 DIAGNOSIS — I129 Hypertensive chronic kidney disease with stage 1 through stage 4 chronic kidney disease, or unspecified chronic kidney disease: Secondary | ICD-10-CM | POA: Diagnosis not present

## 2023-10-24 DIAGNOSIS — K760 Fatty (change of) liver, not elsewhere classified: Secondary | ICD-10-CM | POA: Diagnosis not present

## 2023-10-24 DIAGNOSIS — E782 Mixed hyperlipidemia: Secondary | ICD-10-CM | POA: Diagnosis not present

## 2023-10-24 DIAGNOSIS — Z794 Long term (current) use of insulin: Secondary | ICD-10-CM | POA: Diagnosis not present

## 2023-10-24 DIAGNOSIS — N1832 Chronic kidney disease, stage 3b: Secondary | ICD-10-CM | POA: Diagnosis not present

## 2023-10-24 MED ORDER — FLUTICASONE PROPIONATE 50 MCG/ACT NA SUSP
2.0000 | Freq: Every morning | NASAL | 3 refills | Status: AC
  Filled 2023-10-24: qty 48, 90d supply, fill #0
  Filled 2024-01-25: qty 48, 90d supply, fill #1
  Filled 2024-06-09: qty 48, 90d supply, fill #2
  Filled 2024-09-13: qty 48, 90d supply, fill #3

## 2023-10-28 ENCOUNTER — Other Ambulatory Visit (HOSPITAL_COMMUNITY): Payer: Self-pay

## 2023-10-30 ENCOUNTER — Inpatient Hospital Stay: Payer: PPO | Attending: Hematology

## 2023-10-30 ENCOUNTER — Inpatient Hospital Stay: Payer: PPO

## 2023-10-30 ENCOUNTER — Encounter: Payer: Self-pay | Admitting: Hematology

## 2023-10-30 ENCOUNTER — Other Ambulatory Visit: Payer: Self-pay

## 2023-10-30 VITALS — BP 117/48 | HR 72 | Temp 98.0°F | Resp 18 | Wt 206.0 lb

## 2023-10-30 DIAGNOSIS — D509 Iron deficiency anemia, unspecified: Secondary | ICD-10-CM | POA: Insufficient documentation

## 2023-10-30 DIAGNOSIS — D696 Thrombocytopenia, unspecified: Secondary | ICD-10-CM | POA: Insufficient documentation

## 2023-10-30 DIAGNOSIS — Z7982 Long term (current) use of aspirin: Secondary | ICD-10-CM | POA: Diagnosis not present

## 2023-10-30 DIAGNOSIS — C9110 Chronic lymphocytic leukemia of B-cell type not having achieved remission: Secondary | ICD-10-CM | POA: Insufficient documentation

## 2023-10-30 DIAGNOSIS — Z7951 Long term (current) use of inhaled steroids: Secondary | ICD-10-CM | POA: Insufficient documentation

## 2023-10-30 DIAGNOSIS — Z79899 Other long term (current) drug therapy: Secondary | ICD-10-CM | POA: Diagnosis not present

## 2023-10-30 DIAGNOSIS — C8300 Small cell B-cell lymphoma, unspecified site: Secondary | ICD-10-CM

## 2023-10-30 DIAGNOSIS — Z7969 Long term (current) use of other immunomodulators and immunosuppressants: Secondary | ICD-10-CM | POA: Diagnosis not present

## 2023-10-30 DIAGNOSIS — D801 Nonfamilial hypogammaglobulinemia: Secondary | ICD-10-CM

## 2023-10-30 LAB — CMP (CANCER CENTER ONLY)
ALT: 24 U/L (ref 0–44)
AST: 19 U/L (ref 15–41)
Albumin: 4.3 g/dL (ref 3.5–5.0)
Alkaline Phosphatase: 98 U/L (ref 38–126)
Anion gap: 7 (ref 5–15)
BUN: 25 mg/dL — ABNORMAL HIGH (ref 8–23)
CO2: 24 mmol/L (ref 22–32)
Calcium: 9.1 mg/dL (ref 8.9–10.3)
Chloride: 106 mmol/L (ref 98–111)
Creatinine: 0.87 mg/dL (ref 0.44–1.00)
GFR, Estimated: >60 mL/min (ref >60)
Glucose, Bld: 108 mg/dL — ABNORMAL HIGH (ref 70–99)
Potassium: 4.4 mmol/L (ref 3.5–5.1)
Sodium: 137 mmol/L (ref 135–145)
Total Bilirubin: 0.5 mg/dL (ref 0.0–1.2)
Total Protein: 6.4 g/dL — ABNORMAL LOW (ref 6.5–8.1)

## 2023-10-30 LAB — CBC WITH DIFFERENTIAL (CANCER CENTER ONLY)
Abs Immature Granulocytes: 0.02 10*3/uL (ref 0.00–0.07)
Basophils Absolute: 0 10*3/uL (ref 0.0–0.1)
Basophils Relative: 1 %
Eosinophils Absolute: 0.1 10*3/uL (ref 0.0–0.5)
Eosinophils Relative: 1 %
HCT: 32.1 % — ABNORMAL LOW (ref 36.0–46.0)
Hemoglobin: 9.5 g/dL — ABNORMAL LOW (ref 12.0–15.0)
Immature Granulocytes: 0 %
Lymphocytes Relative: 14 %
Lymphs Abs: 0.6 10*3/uL — ABNORMAL LOW (ref 0.7–4.0)
MCH: 22.1 pg — ABNORMAL LOW (ref 26.0–34.0)
MCHC: 29.6 g/dL — ABNORMAL LOW (ref 30.0–36.0)
MCV: 74.8 fL — ABNORMAL LOW (ref 80.0–100.0)
Monocytes Absolute: 0.5 10*3/uL (ref 0.1–1.0)
Monocytes Relative: 10 %
Neutro Abs: 3.4 10*3/uL (ref 1.7–7.7)
Neutrophils Relative %: 74 %
Platelet Count: 63 10*3/uL — ABNORMAL LOW (ref 150–400)
RBC: 4.29 MIL/uL (ref 3.87–5.11)
RDW: 16.8 % — ABNORMAL HIGH (ref 11.5–15.5)
WBC Count: 4.6 10*3/uL (ref 4.0–10.5)
nRBC: 0 % (ref 0.0–0.2)

## 2023-10-30 LAB — URIC ACID: Uric Acid, Serum: 5 mg/dL (ref 2.5–7.1)

## 2023-10-30 MED ORDER — ACETAMINOPHEN 325 MG PO TABS
650.0000 mg | ORAL_TABLET | Freq: Once | ORAL | Status: AC
  Administered 2023-10-30: 650 mg via ORAL
  Filled 2023-10-30: qty 2

## 2023-10-30 MED ORDER — ONDANSETRON HCL 4 MG/2ML IJ SOLN
4.0000 mg | Freq: Once | INTRAMUSCULAR | Status: AC
  Administered 2023-10-30: 4 mg via INTRAVENOUS
  Filled 2023-10-30: qty 2

## 2023-10-30 MED ORDER — DIPHENHYDRAMINE HCL 25 MG PO CAPS
25.0000 mg | ORAL_CAPSULE | Freq: Once | ORAL | Status: AC
  Administered 2023-10-30: 25 mg via ORAL
  Filled 2023-10-30: qty 1

## 2023-10-30 MED ORDER — DEXTROSE 5 % IV SOLN: INTRAVENOUS | Status: DC

## 2023-10-30 MED ORDER — METHYLPREDNISOLONE SODIUM SUCC 40 MG IJ SOLR
40.0000 mg | Freq: Every day | INTRAMUSCULAR | Status: DC
  Administered 2023-10-30: 40 mg via INTRAVENOUS
  Filled 2023-10-30: qty 1

## 2023-10-30 MED ORDER — IMMUNE GLOBULIN (HUMAN) 10 GM/100ML IV SOLN
40.0000 g | INTRAVENOUS | Status: DC
  Administered 2023-10-30: 40 g via INTRAVENOUS
  Filled 2023-10-30: qty 400

## 2023-10-30 NOTE — Telephone Encounter (Signed)
Called patient and she will be coming in today for her IVIG due to the bad weather expected later this week. Lorayne Marek, RN

## 2023-10-30 NOTE — Patient Instructions (Signed)
 Immune Globulin Injection What is this medication? IMMUNE GLOBULIN (im MUNE GLOB yoo lin) treats many immune system conditions. It works by Designer, multimedia extra antibodies. Antibodies are proteins made by the immune system that help protect the body. This medicine may be used for other purposes; ask your health care provider or pharmacist if you have questions. COMMON BRAND NAME(S): ASCENIV, Baygam, BIVIGAM, Carimune, Carimune NF, cutaquig, Cuvitru, Flebogamma, Flebogamma DIF, GamaSTAN, GamaSTAN S/D, Gamimune N, Gammagard, Gammagard S/D, Gammaked, Gammaplex, Gammar-P IV, Gamunex, Gamunex-C, Hizentra, Iveegam, Iveegam EN, Octagam, Panglobulin, Panglobulin NF, panzyga, Polygam S/D, Privigen, Sandoglobulin, Venoglobulin-S, Vigam, Vivaglobulin, Xembify What should I tell my care team before I take this medication? They need to know if you have any of these conditions: Blood clotting disorder Condition where you have excess fluid in your body, such as heart failure or edema Dehydration Diabetes Have had blood clots Heart disease Immune system conditions Kidney disease Low levels of IgA Recent or upcoming vaccine An unusual or allergic reaction to immune globulin, other medications, foods, dyes, or preservatives Pregnant or trying to get pregnant Breastfeeding How should I use this medication? This medication is infused into a vein or under the skin. It is usually given by your care team in a hospital or clinic setting. It may also be given at home. If you get this medication at home, you will be taught how to prepare and give it. Use exactly as directed. Take it as directed on the prescription label at the same time every day. Keep taking it unless your care team tells you to stop. It is important that you put your used needles and syringes in a special sharps container. Do not put them in a trash can. If you do not have a sharps container, call your pharmacist or care team to get one. Talk to  your care team about the use of this medication in children. While it may be given to children for selected conditions, precautions do apply. Overdosage: If you think you have taken too much of this medicine contact a poison control center or emergency room at once. NOTE: This medicine is only for you. Do not share this medicine with others. What if I miss a dose? If you get this medication at the hospital or clinic: It is important not to miss your dose. Call your care team if you are unable to keep an appointment. If you give yourself this medication at home: If you miss a dose, take it as soon as you can. Then continue your normal schedule. If it is almost time for your next dose, take only that dose. Do not take double or extra doses. Call your care team with questions. What may interact with this medication? Live virus vaccines This list may not describe all possible interactions. Give your health care provider a list of all the medicines, herbs, non-prescription drugs, or dietary supplements you use. Also tell them if you smoke, drink alcohol, or use illegal drugs. Some items may interact with your medicine. What should I watch for while using this medication? Your condition will be monitored carefully while you are receiving this medication. Tell your care team if your symptoms do not start to get better or if they get worse. You may need blood work done while you are taking this medication. This medication increases the risk of blood clots. People with heart, blood vessel, or blood clotting conditions are more likely to develop a blood clot. Other risk factors include advanced age, estrogen  use, tobacco use, lack of movement, and being overweight. This medication can decrease the response to a vaccine. If you need to get vaccinated, tell your care team if you have received this medication within the last year. Extra booster doses may be needed. Talk to your care team to see if a different  vaccination schedule is needed. If you have diabetes, you may get a falsely elevated blood sugar reading. Talk to your care team about how to check your blood sugar while taking this medication. What side effects may I notice from receiving this medication? Side effects that you should report to your care team as soon as possible: Allergic reactions--skin rash, itching, hives, swelling of the face, lips, tongue, or throat Blood clot--pain, swelling, or warmth in the leg, shortness of breath, chest pain Fever, neck pain or stiffness, sensitivity to light, headache, nausea, vomiting, confusion, which may be signs of meningitis Hemolytic anemia--unusual weakness or fatigue, dizziness, headache, trouble breathing, dark urine, yellowing skin or eyes Kidney injury--decrease in the amount of urine, swelling of the ankles, hands, or feet Low sodium level--muscle weakness, fatigue, dizziness, headache, confusion Shortness of breath or trouble breathing, cough, unusual weakness or fatigue, blue skin or lips Side effects that usually do not require medical attention (report these to your care team if they continue or are bothersome): Chills Diarrhea Fever Headache Nausea This list may not describe all possible side effects. Call your doctor for medical advice about side effects. You may report side effects to FDA at 1-800-FDA-1088. Where should I keep my medication? Keep out of the reach of children and pets. You will be instructed on how to store this medication. Get rid of any unused medication after the expiration date. To get rid of medications that are no longer needed or have expired: Take the medication to a medication take-back program. Check with your pharmacy or law enforcement to find a location. If you cannot return the medication, ask your pharmacist or care team how to get rid of this medication safely. NOTE: This sheet is a summary. It may not cover all possible information. If you have  questions about this medicine, talk to your doctor, pharmacist, or health care provider.  2024 Elsevier/Gold Standard (2023-08-11 00:00:00)

## 2023-11-02 ENCOUNTER — Ambulatory Visit: Payer: PPO

## 2023-11-02 ENCOUNTER — Other Ambulatory Visit: Payer: PPO

## 2023-11-06 ENCOUNTER — Other Ambulatory Visit (HOSPITAL_COMMUNITY): Payer: Self-pay

## 2023-11-06 ENCOUNTER — Encounter: Payer: Self-pay | Admitting: Hematology

## 2023-11-06 ENCOUNTER — Other Ambulatory Visit: Payer: Self-pay

## 2023-11-06 NOTE — Progress Notes (Signed)
 Specialty Pharmacy Refill Coordination Note  Janice [name] is a 73 y.o. female contacted today regarding refills of specialty medication(s) Venetoclax [name])   Patient requested Delivery   Delivery date: 11/14/23   Verified address: Patient address [address]  [place]   Medication will be filled on 11/13/23.

## 2023-11-06 NOTE — Progress Notes (Signed)
 Specialty Pharmacy Ongoing Clinical Assessment Note  Janice [name] is a 73 y.o. female who is being followed by the specialty pharmacy service for RxSp Oncology   Patient's specialty medication(s) reviewed today: Venetoclax (VENCLEXTA)   Missed doses in the last 4 weeks: 0   Patient/Caregiver did not have any additional questions or concerns.   Therapeutic benefit summary: Patient is achieving benefit   Adverse events/side effects summary: Experienced adverse events/side effects (alternating diarrhea and consitpation, as well as dizziness, all remain tolerable at this time)   Patient's therapy is appropriate to: Continue    Goals Addressed             This Visit's Progress    Achieve or maintain remission   Improving    Patient is not on track and no change. Patient will maintain adherence.  Dr. Candise Che reported at the office visit that the patient's swollen lymph nodes had resolved.          Follow up:  3 months  Servando Snare Specialty Pharmacist

## 2023-11-07 ENCOUNTER — Other Ambulatory Visit (HOSPITAL_COMMUNITY): Payer: Self-pay

## 2023-11-07 ENCOUNTER — Other Ambulatory Visit (HOSPITAL_BASED_OUTPATIENT_CLINIC_OR_DEPARTMENT_OTHER): Payer: Self-pay

## 2023-11-09 ENCOUNTER — Encounter: Payer: Self-pay | Admitting: Hematology

## 2023-11-09 DIAGNOSIS — K760 Fatty (change of) liver, not elsewhere classified: Secondary | ICD-10-CM | POA: Diagnosis not present

## 2023-11-09 DIAGNOSIS — E782 Mixed hyperlipidemia: Secondary | ICD-10-CM | POA: Diagnosis not present

## 2023-11-09 DIAGNOSIS — L299 Pruritus, unspecified: Secondary | ICD-10-CM | POA: Diagnosis not present

## 2023-11-09 DIAGNOSIS — I129 Hypertensive chronic kidney disease with stage 1 through stage 4 chronic kidney disease, or unspecified chronic kidney disease: Secondary | ICD-10-CM | POA: Diagnosis not present

## 2023-11-09 DIAGNOSIS — C8300 Small cell B-cell lymphoma, unspecified site: Secondary | ICD-10-CM | POA: Diagnosis not present

## 2023-11-09 NOTE — Progress Notes (Signed)
 PATIENT: Janice [name] DOB: [birth year]  REASON FOR VISIT: follow up HISTORY FROM: patient  Chief Complaint  Patient presents with   Obstructive Sleep Apnea    Rm1, alone, pt stated she's compliant to wear cpap, feels well rested, ess 6     HISTORY OF PRESENT ILLNESS:  11/13/23 ALL:  Janice [name] returns for initial compliance review with new CPAP machine. She is doing well with her new machine. She denies concerns with machine but not happy with fit of her headgear. She is using [facility] but reports headgear is too big. She reports reaching out to DME years ago and was told they could not order the headgear in a smaller size. AHI well managed and very low leak on most recent report.     08/29/2023 ALL:  Janice [name] is a 73 y.o. female here today for follow up for OSA on CPAP. She was last seen by Dr Vickey Huger 05/2023 in need of new machine. HST 07/2023 showed mild OSA with AHI 11/hr. AutoPAP optional per Dr Dohmeier's result note. She continues to do well on her old machine. She is interested in continuing therapy and wishes for me to order a new machine. She denies concerns with current machine.      REVIEW OF SYSTEMS: Out of a complete 14 system review of symptoms, the patient complains only of the following symptoms, none and all other reviewed systems are negative.  ESS: 6/24  ALLERGIES: Allergies  Allergen Reactions   Breo Ellipta [Fluticasone Furoate-Vilanterol] Anaphylaxis and Hives    Other reaction(s): hives  breo ellipta   Gazyva [Obinutuzumab] Shortness Of Breath    04/27/23- SOB, Severe back pain/neck pain, HA, flushing and some diaphoresis; see progress note   Iodine Hives, Swelling and Other (See Comments)    And Betadine/caused hives and swelling   Latex Hives, Itching, Rash and Other (See Comments)    Burning   Tessalon [Benzonatate] Anaphylaxis, Hives and Other (See Comments)   Zocor [Simvastatin] Other (See Comments)    Caused neuropathy in  arms Neuropathy in arms   Levaquin [Levofloxacin] Other (See Comments)    Joint aches//leg, shoulder pain Joint pain   Penicillins Hives   Povidone-Iodine Hives   Wellbutrin [Bupropion] Other (See Comments)    Negative thoughts. Crazy thoughts   Amoxicillin Hives    Blisters Did it involve swelling of the face/tongue/throat, SOB, or low BP? Yes Did it involve sudden or severe rash/hives, skin peeling, or any reaction on the inside of your mouth or nose? Yes Did you need to seek medical attention at a hospital or doctor's office?Unknown When did it last happen? 2019      If all above answers are "NO", may proceed with cephalosporin use.    Chlorhexidine Itching     Chlorhexidine Gluconate (CHG) Cloths/topical solution   Diflucan [Fluconazole] Hives and Other (See Comments)    blister   Thalitone [Chlorthalidone] Other (See Comments)    Ears ringing    Amoxicillin-Pot Clavulanate Rash   Vibra-Tab [Doxycycline] Diarrhea and Itching    HOME MEDICATIONS: Outpatient Medications Prior to Visit  Medication Sig Dispense Refill   albuterol (VENTOLIN HFA) 108 (90 Base) MCG/ACT inhaler Inhale 1-2 puffs into the lungs every 6 (six) hours as needed (Cough). 18 g 0   allopurinol (ZYLOPRIM) 100 MG tablet Take 1 tablet (100 mg) by mouth 2 times daily. 60 tablet 1   amLODipine (NORVASC) 5 MG tablet Take 5 mg by mouth daily.  aspirin EC 81 MG tablet Take 1 tablet (81 mg total) by mouth daily. Swallow whole. 90 tablet 3   b complex vitamins capsule Take 1 capsule by mouth in the morning.     bacitracin 500 UNIT/GM ointment Apply topically 2 (two) times daily for 10 days 30 g 0   budesonide-formoterol (SYMBICORT) 80-4.5 MCG/ACT inhaler Inhale 2 puffs into the lungs daily. (Patient taking differently: Inhale 2 puffs into the lungs 2 (two) times daily. Patient usually does 2 puffs in morning and 1 puff in evening.) 10.2 g 12   Carboxymethylcellul-Glycerin (REFRESH OPTIVE PF OP) Place 1 drop into  both eyes 3 (three) times daily as needed (dry/irritated eyes.). Non-pres     cephALEXin (KEFLEX) 500 MG capsule Take 2 capsules (1,000 mg total) by mouth as directed 1 hour prior to dental work. 20 capsule 0   cetirizine (ZYRTEC) 10 MG tablet Take 5 mg by mouth in the morning.     Coenzyme Q10 (CO Q-10) 100 MG CAPS Take 100 mg by mouth in the morning.     Continuous Glucose Sensor (FREESTYLE LIBRE 2 SENSOR) MISC Use to monitor blood glucose continuously - change every 14 days. 6 each 3   desoximetasone (TOPICORT) 0.25 % cream Apply a small amount to skin twice a day as needed for flares 60 g 0   diclofenac Sodium (VOLTAREN ARTHRITIS PAIN) 1 % GEL Apply topically 4 (four) times daily to bilateral knees and ankles 200 g 5   diclofenac sodium (VOLTAREN) 1 % GEL Apply 2 g topically 2 (two) times daily as needed (knee pain/ foot /Triger finger).  2   empagliflozin (JARDIANCE) 10 MG TABS tablet Take 10 mg by mouth in the morning.     empagliflozin (JARDIANCE) 10 MG TABS tablet Take 1 tablet (10 mg total) by mouth daily. 90 tablet 3   fluticasone (FLONASE) 50 MCG/ACT nasal spray Place 2 sprays into both nostrils in the morning. 48 g 3   furosemide (LASIX) 20 MG tablet Take 1 tablet (20 mg total) by mouth daily. 90 tablet 3   gabapentin (NEURONTIN) 300 MG capsule Take 1 capsule (300 mg total) by mouth every morning AND 2 capsules (600 mg total) at bedtime. 180 capsule 1   glucose blood test strip Use to test blood sugar 3 times daily as directed 300 each 3   hydrALAZINE (APRESOLINE) 25 MG tablet Take 1 tablet (25 mg total) by mouth 2 (two) times daily. 180 tablet 2   hydrocortisone (ANUSOL-HC) 25 MG suppository Place 1 suppository (25 mg total) rectally daily. 30 suppository 0   influenza vaccine adjuvanted (FLUAD) 0.5 ML injection Inject into the muscle. 0.5 mL 0   insulin aspart (NOVOLOG FLEXPEN) 100 UNIT/ML FlexPen Inject 30 Units into the skin daily with breakfast AND 30 Units daily with lunch AND 30  Units daily with supper. 90 mL 3   insulin aspart (NOVOLOG FLEXPEN) 100 UNIT/ML FlexPen Inject 30 Units into the skin daily with breakfast AND 30 Units daily with lunch AND 30 Units daily with supper. 45 mL 3   insulin degludec (TRESIBA FLEXTOUCH) 200 UNIT/ML FlexTouch Pen Inject 80 Units into the skin daily. 30 mL 11   Insulin Pen Needle 32G X 6 MM MISC Use 6 times daily with insulin as directed 400 each 3   irbesartan (AVAPRO) 300 MG tablet Take 1 tablet (300 mg total) by mouth in the morning. 90 tablet 3   Lancets (ONETOUCH DELICA PLUS LANCET33G) MISC Use to test blood sugar  3 times daily 300 each 2   LORazepam (ATIVAN) 0.5 MG tablet Take 1-2 tablets (0.5-1 mg total) by mouth once as needed for up to 1 dose (prior to MRI or CT scan for claustrophobia). 4 tablet 0   metFORMIN (GLUCOPHAGE-XR) 500 MG 24 hr tablet Take 2 tablets (1,000 mg total) by mouth 2 (two) times daily. 360 tablet 3   omeprazole (PRILOSEC) 40 MG capsule Take 1 capsule (40 mg total) by mouth 2 (two) times daily. 180 capsule 3   ondansetron (ZOFRAN-ODT) 4 MG disintegrating tablet Dissolve 1 tablet (4 mg) by mouth every 8 hours as needed for nausea or vomiting. 20 tablet 0   rosuvastatin (CRESTOR) 10 MG tablet Take 1 tablet (10 mg total) by mouth daily. 90 tablet 3   sertraline (ZOLOFT) 50 MG tablet Take 1 tablet (50 mg total) by mouth daily. (Patient taking differently: Take 50 mg by mouth every evening.) 90 tablet 3   spironolactone (ALDACTONE) 50 MG tablet Take 1 tablet (50 mg total) by mouth daily. 90 tablet 3   terconazole (TERAZOL 3) 0.8 % vaginal cream Place 1 applicator vaginally daily as needed (yeast infections).     venetoclax (VENCLEXTA) 100 MG tablet Take 1 tablet (100 mg total) by mouth daily. Tablets should be swallowed whole with a meal and a full glass of water. 30 tablet 3   azelastine (ASTELIN) 0.1 % nasal spray Place 1 spray into both nostrils 2 (two) times daily. Use in each nostril as directed 9 mL 11    amLODipine (NORVASC) 10 MG tablet Take 1 tablet (10 mg total) by mouth daily. 90 tablet 0   diclofenac Sodium (PENNSAID) 2 % SOLN Apply 2 Pump (40 mg total) topically up to 4 (four) times daily as needed. 112 g 5   No facility-administered medications prior to visit.    PAST MEDICAL HISTORY: Past Medical History:  Diagnosis Date   Allergy    Asthma    seasonal   Cellulitis 2013   Left toe   Chronic kidney disease    stage 1   Cirrhosis (HCC)    Common migraine    History of   Complication of anesthesia    Per pt/had breathing problems with "block" during rotator cuff surgery. 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   Coronary artery disease    DDD (degenerative disc disease)    Depression    denies takes paxil for migraines   Diabetes mellitus    type 2   Diabetic peripheral neuropathy (HCC)    Difficult airway for intubation 10/20/2022   DLx 1 with MAC 4, unable to see cords, DL with lopro S3 glidescope, easy atraumatic intubation   Difficult intubation 03/03/2023   Diverticulitis    Diverticulosis    DJD (degenerative joint disease)    Esophageal varices (HCC)    GERD (gastroesophageal reflux disease)    History of colon polyps    hyperplastic   History of gastric polyp    Hyperlipidemia    Hypertension    Iron deficiency anemia    Obesity    OSA on CPAP    cpap   Peripheral neuropathy    Pneumonia    april 2020  mild   Primary localized osteoarthritis of left knee 03/27/2019   Sensorineural hearing loss    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HCC)     PAST SURGICAL HISTORY: Past Surgical History:  Procedure Laterality Date   ANKLE SURGERY     Left    BREAST BIOPSY  Left 2021   left axilla, lymphoma   COLONOSCOPY  05/2018   ESOPHAGOGASTRODUODENOSCOPY (EGD) WITH PROPOFOL N/A 03/09/2023   Procedure: ESOPHAGOGASTRODUODENOSCOPY (EGD) WITH PROPOFOL;  Surgeon: Iva Boop, MD;  Location: [facility];  Service: Gastroenterology;  Laterality: N/A;   LEEP N/A  09/14/2018   Procedure: LOOP ELECTROSURGICAL EXCISION PROCEDURE (LEEP);  Surgeon: Richardean Chimera, MD;  Location: [facility];  Service: Gynecology;  Laterality: N/A;   MASS EXCISION Right 06/24/2021   Procedure: EXCISION RIGHT POSTERIOR NECK MASS;  Surgeon: Kinsinger, De Blanch, MD;  Location: [facility];  Service: General;  Laterality: Right;   ROTATOR CUFF REPAIR Bilateral 2012, 2015   TONSILLECTOMY     TOTAL KNEE ARTHROPLASTY Left 04/08/2019   Procedure: TOTAL KNEE ARTHROPLASTY;  Surgeon: Salvatore Marvel, MD;  Location: [facility];  Service: Orthopedics;  Laterality: Left;   UMBILICAL HERNIA REPAIR N/A 10/20/2022   Procedure: OPEN HERNIA REPAIR UMBILICAL ADULT with Mesh;  Surgeon: Kinsinger, De Blanch, MD;  Location: [facility];  Service: General;  Laterality: N/A;   UPPER GI ENDOSCOPY  05/2018    FAMILY HISTORY: Family History  Problem Relation Age of Onset   Breast cancer Mother    Bone cancer Mother    Heart failure Father    Diabetes Mellitus II Father    Hypertension Father    Diabetes Mellitus II Sister    Obesity Sister    Other Sister        retina problem   Hypertension Brother    Diabetes Mellitus II Brother    Colon cancer Neg Hx    Rectal cancer Neg Hx    Stomach cancer Neg Hx    Esophageal cancer Neg Hx    Pancreatic cancer Neg Hx    Liver disease Neg Hx     SOCIAL HISTORY: Social History   Socioeconomic History   Marital status: Widowed    Spouse name: Not on file   Number of children: 0   Years of education: 3 years college   Highest education level: Not on file  Occupational History   Occupation: Unemployed  Tobacco Use   Smoking status: Never    Passive exposure: Past   Smokeless tobacco: Never  Vaping Use   Vaping status: Never Used  Substance and Sexual Activity   Alcohol use: Not Currently    Comment: very rare   Drug use: No   Sexual activity: Not Currently    Birth control/protection: None, Post-menopausal  Other Topics Concern   Not  on file  Social History Narrative   1 caffeine drink daily    Right-handed   widow (husband died from [place] cirrhosis)   Social Drivers of Health   Financial Resource Strain: Medium Risk (08/20/2020)   Overall Financial Resource Strain (CARDIA)    Difficulty of Paying Living Expenses: Somewhat hard  Food Insecurity: No Food Insecurity (10/20/2022)   Hunger Vital Sign    Worried About Running Out of Food in the Last Year: Never true    Ran Out of Food in the Last Year: Never true  Transportation Needs: No Transportation Needs (10/20/2022)   PRAPARE - Administrator, Civil Service (Medical): No    Lack of Transportation (Non-Medical): No  Physical Activity: Not on file  Stress: Not on file  Social Connections: Unknown (01/25/2022)   [facility]   Social Network    Social Network: Not on file  Intimate Partner Violence: Not At Risk (10/20/2022)  Humiliation, Afraid, Rape, and Kick questionnaire    Fear of Current or Ex-Partner: No    Emotionally Abused: No    Physically Abused: No    Sexually Abused: No     PHYSICAL EXAM  Vitals:   11/13/23 1256  BP: 122/69  Pulse: (!) 55  Weight: 211 lb 8 oz (95.9 kg)  Height: 5' 4.5" (1.638 m)    Body mass index is 35.74 kg/m.  Generalized: Well developed, in no acute distress  Cardiology: normal rate and rhythm, no murmur noted Respiratory: clear to auscultation bilaterally  Neurological examination  Mentation: Alert oriented to time, place, history taking. Follows all commands speech and language fluent Cranial nerve II-XII: Pupils were equal round reactive to light. Extraocular movements were full, visual field were full  Motor: The motor testing reveals 5 over 5 strength of all 4 extremities. Good symmetric motor tone is noted throughout.  Gait and station: Gait is normal.    DIAGNOSTIC DATA (LABS, IMAGING, TESTING) - I reviewed patient records, labs, notes, testing and imaging myself  where available.      No data to display           Lab Results  Component Value Date   WBC 4.6 10/30/2023   HGB 9.5 (L) 10/30/2023   HCT 32.1 (L) 10/30/2023   MCV 74.8 (L) 10/30/2023   PLT 63 (L) 10/30/2023      Component Value Date/Time   NA 137 10/30/2023 1333   NA 137 10/18/2021 1222   K 4.4 10/30/2023 1333   CL 106 10/30/2023 1333   CO2 24 10/30/2023 1333   GLUCOSE 108 (H) 10/30/2023 1333   BUN 25 (H) 10/30/2023 1333   BUN 11 10/18/2021 1222   CREATININE 0.87 10/30/2023 1333   CALCIUM 9.1 10/30/2023 1333   PROT 6.4 (L) 10/30/2023 1333   ALBUMIN 4.3 10/30/2023 1333   AST 19 10/30/2023 1333   ALT 24 10/30/2023 1333   ALKPHOS 98 10/30/2023 1333   BILITOT 0.5 10/30/2023 1333   GFRNONAA >60 10/30/2023 1333   GFRAA >60 04/08/2020 1435   Lab Results  Component Value Date   CHOL 125 10/18/2021   HDL 43 10/18/2021   LDLCALC 65 10/18/2021   TRIG 86 10/18/2021   Lab Results  Component Value Date   HGBA1C 7.5 (H) 10/03/2022   No results found for: "VITAMINB12" Lab Results  Component Value Date   TSH 1.400 10/18/2021     ASSESSMENT AND PLAN 73 y.o. year old female  has a past medical history of Allergy, Asthma, Cellulitis (2013), Chronic kidney disease, Cirrhosis (HCC), Common migraine, Complication of anesthesia, Coronary artery disease, DDD (degenerative disc disease), Depression, Diabetes mellitus, Diabetic peripheral neuropathy (HCC), Difficult airway for intubation (10/20/2022), Difficult intubation (03/03/2023), Diverticulitis, Diverticulosis, DJD (degenerative joint disease), Esophageal varices (HCC), GERD (gastroesophageal reflux disease), History of colon polyps, History of gastric polyp, Hyperlipidemia, Hypertension, Iron deficiency anemia, Obesity, OSA on CPAP, Peripheral neuropathy, Pneumonia, Primary localized osteoarthritis of left knee (03/27/2019), Sensorineural hearing loss, and 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HCC). here with     ICD-10-CM   1. OSA on  CPAP  G47.33        Janice [name] is doing well on CPAP therapy. Compliance report reveals excellent compliance. She was encouraged to continue using CPAP nightly and for greater than 4 hours each night. We will update supply orders as indicated. I have encouraged her to call [facility] to inquire about any new mask/headgear options that may fit better.  Risks  of untreated sleep apnea review and education materials provided. Healthy lifestyle habits encouraged. She will follow up in 1 year. She verbalizes understanding and agreement with this plan.    No orders of the defined types were placed in this encounter.    No orders of the defined types were placed in this encounter.     Shawnie Dapper, FNP-C 11/13/2023, 1:37 PM [facility] [address], [address] [place], [place] [address] [phone]

## 2023-11-09 NOTE — Patient Instructions (Addendum)
 Please continue using your CPAP regularly. While your insurance requires that you use CPAP at least 4 hours each night on 70% of the nights, I recommend, that you not skip any nights and use it throughout the night if you can. Getting used to CPAP and staying with the treatment long term does take time and patience and discipline. Untreated obstructive sleep apnea when it is moderate to severe can have an adverse impact on cardiovascular health and raise her risk for heart disease, arrhythmias, hypertension, congestive heart failure, stroke and diabetes. Untreated obstructive sleep apnea causes sleep disruption, nonrestorative sleep, and sleep deprivation. This can have an impact on your day to day functioning and cause daytime sleepiness and impairment of cognitive function, memory loss, mood disturbance, and problems focussing. Using CPAP regularly can improve these symptoms.  We will update supply orders, today. Reach out to [facility] for any concerns with headgear. DME: [facility] - CPAP Phone: [phone], press option 1  Follow up in 1 year

## 2023-11-09 NOTE — Progress Notes (Signed)
 Marland Kitchen

## 2023-11-09 NOTE — Progress Notes (Signed)
 Contacted pt regarding MyChart message . Pt had been evaluated by PCP . Provider felt her current symptoms were being caused by Venetoclax. Per Dr Candise Che pt to stop Venetoclax and report on symptoms on Monday. Pt acknowledged information and verbalized understanding.

## 2023-11-13 ENCOUNTER — Other Ambulatory Visit (HOSPITAL_COMMUNITY): Payer: Self-pay

## 2023-11-13 ENCOUNTER — Encounter: Payer: Self-pay | Admitting: Hematology

## 2023-11-13 ENCOUNTER — Encounter: Payer: Self-pay | Admitting: Family Medicine

## 2023-11-13 ENCOUNTER — Ambulatory Visit: Payer: PPO | Admitting: Family Medicine

## 2023-11-13 VITALS — BP 122/69 | HR 55 | Ht 64.5 in | Wt 211.5 lb

## 2023-11-13 DIAGNOSIS — G4733 Obstructive sleep apnea (adult) (pediatric): Secondary | ICD-10-CM

## 2023-11-14 ENCOUNTER — Encounter: Payer: Self-pay | Admitting: Family Medicine

## 2023-11-14 ENCOUNTER — Encounter: Payer: Self-pay | Admitting: Hematology

## 2023-11-15 ENCOUNTER — Other Ambulatory Visit: Payer: Self-pay | Admitting: Internal Medicine

## 2023-11-15 ENCOUNTER — Other Ambulatory Visit: Payer: Self-pay

## 2023-11-15 DIAGNOSIS — Z1231 Encounter for screening mammogram for malignant neoplasm of breast: Secondary | ICD-10-CM

## 2023-11-16 ENCOUNTER — Ambulatory Visit

## 2023-11-16 ENCOUNTER — Other Ambulatory Visit: Payer: Self-pay

## 2023-11-16 ENCOUNTER — Other Ambulatory Visit (HOSPITAL_COMMUNITY): Payer: Self-pay

## 2023-11-16 ENCOUNTER — Ambulatory Visit
Admission: RE | Admit: 2023-11-16 | Discharge: 2023-11-16 | Disposition: A | Discharge status: Home / Self Care | Source: Ambulatory Visit | Attending: Internal Medicine | Admitting: Internal Medicine

## 2023-11-16 DIAGNOSIS — Z1231 Encounter for screening mammogram for malignant neoplasm of breast: Secondary | ICD-10-CM

## 2023-11-16 MED ORDER — INSULIN PEN NEEDLE 32G X 4 MM MISC
1.0000 | Freq: Every day | 3 refills | Status: DC
  Filled 2023-11-16: qty 400, 80d supply, fill #0
  Filled 2024-03-05: qty 400, 80d supply, fill #1
  Filled 2024-05-27: qty 400, 80d supply, fill #2
  Filled 2024-08-01: qty 400, 80d supply, fill #3

## 2023-11-17 ENCOUNTER — Other Ambulatory Visit: Payer: Self-pay

## 2023-11-17 ENCOUNTER — Other Ambulatory Visit (HOSPITAL_COMMUNITY): Payer: Self-pay

## 2023-11-18 ENCOUNTER — Other Ambulatory Visit (HOSPITAL_COMMUNITY): Payer: Self-pay

## 2023-11-21 ENCOUNTER — Other Ambulatory Visit (HOSPITAL_COMMUNITY): Payer: Self-pay

## 2023-11-22 ENCOUNTER — Other Ambulatory Visit: Payer: Self-pay

## 2023-11-22 ENCOUNTER — Other Ambulatory Visit (HOSPITAL_COMMUNITY): Payer: Self-pay

## 2023-11-22 MED ORDER — FREESTYLE LIBRE 2 SENSOR MISC
3 refills | Status: AC
  Filled 2023-11-22: qty 2, 28d supply, fill #0
  Filled 2023-12-15: qty 2, 28d supply, fill #1
  Filled 2024-04-18: qty 2, 28d supply, fill #2

## 2023-11-23 ENCOUNTER — Other Ambulatory Visit (HOSPITAL_COMMUNITY): Payer: Self-pay

## 2023-11-23 MED ORDER — FREESTYLE LIBRE 2 SENSOR MISC
3 refills | Status: AC
  Filled 2023-11-23 – 2024-01-15 (×2): qty 6, 84d supply, fill #0
  Filled 2024-05-18: qty 6, 84d supply, fill #1
  Filled 2024-08-08: qty 6, 84d supply, fill #2

## 2023-11-24 ENCOUNTER — Other Ambulatory Visit (HOSPITAL_COMMUNITY): Payer: Self-pay

## 2023-11-25 ENCOUNTER — Encounter: Payer: Self-pay | Admitting: Hematology

## 2023-11-28 ENCOUNTER — Other Ambulatory Visit: Payer: Self-pay

## 2023-11-28 DIAGNOSIS — D801 Nonfamilial hypogammaglobulinemia: Secondary | ICD-10-CM

## 2023-11-28 DIAGNOSIS — I1310 Hypertensive heart and chronic kidney disease without heart failure, with stage 1 through stage 4 chronic kidney disease, or unspecified chronic kidney disease: Secondary | ICD-10-CM | POA: Diagnosis not present

## 2023-11-28 DIAGNOSIS — C9110 Chronic lymphocytic leukemia of B-cell type not having achieved remission: Secondary | ICD-10-CM

## 2023-11-28 DIAGNOSIS — D696 Thrombocytopenia, unspecified: Secondary | ICD-10-CM | POA: Diagnosis not present

## 2023-11-28 DIAGNOSIS — C8300 Small cell B-cell lymphoma, unspecified site: Secondary | ICD-10-CM | POA: Diagnosis not present

## 2023-11-28 DIAGNOSIS — R002 Palpitations: Secondary | ICD-10-CM | POA: Diagnosis not present

## 2023-11-28 DIAGNOSIS — K760 Fatty (change of) liver, not elsewhere classified: Secondary | ICD-10-CM | POA: Diagnosis not present

## 2023-11-28 DIAGNOSIS — F331 Major depressive disorder, recurrent, moderate: Secondary | ICD-10-CM | POA: Diagnosis not present

## 2023-11-28 DIAGNOSIS — N1832 Chronic kidney disease, stage 3b: Secondary | ICD-10-CM | POA: Diagnosis not present

## 2023-11-28 DIAGNOSIS — E782 Mixed hyperlipidemia: Secondary | ICD-10-CM | POA: Diagnosis not present

## 2023-11-28 DIAGNOSIS — E119 Type 2 diabetes mellitus without complications: Secondary | ICD-10-CM | POA: Diagnosis not present

## 2023-11-28 DIAGNOSIS — K7460 Unspecified cirrhosis of liver: Secondary | ICD-10-CM | POA: Diagnosis not present

## 2023-11-28 DIAGNOSIS — G629 Polyneuropathy, unspecified: Secondary | ICD-10-CM | POA: Diagnosis not present

## 2023-11-29 ENCOUNTER — Inpatient Hospital Stay: Payer: PPO

## 2023-11-29 ENCOUNTER — Other Ambulatory Visit (HOSPITAL_COMMUNITY): Payer: Self-pay

## 2023-11-29 ENCOUNTER — Inpatient Hospital Stay: Payer: PPO | Attending: Hematology

## 2023-11-29 ENCOUNTER — Inpatient Hospital Stay (HOSPITAL_BASED_OUTPATIENT_CLINIC_OR_DEPARTMENT_OTHER): Payer: PPO | Admitting: Hematology

## 2023-11-29 VITALS — BP 109/46 | HR 77 | Temp 98.1°F | Resp 18 | Ht 64.5 in | Wt 208.6 lb

## 2023-11-29 DIAGNOSIS — Z79899 Other long term (current) drug therapy: Secondary | ICD-10-CM | POA: Diagnosis not present

## 2023-11-29 DIAGNOSIS — C9110 Chronic lymphocytic leukemia of B-cell type not having achieved remission: Secondary | ICD-10-CM | POA: Insufficient documentation

## 2023-11-29 DIAGNOSIS — Z7969 Long term (current) use of other immunomodulators and immunosuppressants: Secondary | ICD-10-CM | POA: Diagnosis not present

## 2023-11-29 DIAGNOSIS — D801 Nonfamilial hypogammaglobulinemia: Secondary | ICD-10-CM | POA: Insufficient documentation

## 2023-11-29 DIAGNOSIS — D508 Other iron deficiency anemias: Secondary | ICD-10-CM | POA: Diagnosis not present

## 2023-11-29 DIAGNOSIS — Z7951 Long term (current) use of inhaled steroids: Secondary | ICD-10-CM | POA: Diagnosis not present

## 2023-11-29 DIAGNOSIS — D696 Thrombocytopenia, unspecified: Secondary | ICD-10-CM | POA: Diagnosis not present

## 2023-11-29 DIAGNOSIS — Z7982 Long term (current) use of aspirin: Secondary | ICD-10-CM | POA: Diagnosis not present

## 2023-11-29 DIAGNOSIS — D509 Iron deficiency anemia, unspecified: Secondary | ICD-10-CM | POA: Insufficient documentation

## 2023-11-29 DIAGNOSIS — C8300 Small cell B-cell lymphoma, unspecified site: Secondary | ICD-10-CM

## 2023-11-29 LAB — CMP (CANCER CENTER ONLY)
ALT: 22 U/L (ref 0–44)
AST: 19 U/L (ref 15–41)
Albumin: 4.5 g/dL (ref 3.5–5.0)
Alkaline Phosphatase: 93 U/L (ref 38–126)
Anion gap: 6 (ref 5–15)
BUN: 34 mg/dL — ABNORMAL HIGH (ref 8–23)
CO2: 23 mmol/L (ref 22–32)
Calcium: 9.2 mg/dL (ref 8.9–10.3)
Chloride: 102 mmol/L (ref 98–111)
Creatinine: 0.95 mg/dL (ref 0.44–1.00)
GFR, Estimated: >60 mL/min (ref >60)
Glucose, Bld: 105 mg/dL — ABNORMAL HIGH (ref 70–99)
Potassium: 5 mmol/L (ref 3.5–5.1)
Sodium: 131 mmol/L — ABNORMAL LOW (ref 135–145)
Total Bilirubin: 0.5 mg/dL (ref 0.0–1.2)
Total Protein: 6.8 g/dL (ref 6.5–8.1)

## 2023-11-29 LAB — CBC WITH DIFFERENTIAL (CANCER CENTER ONLY)
Abs Immature Granulocytes: 0.01 10*3/uL (ref 0.00–0.07)
Basophils Absolute: 0 10*3/uL (ref 0.0–0.1)
Basophils Relative: 0 %
Eosinophils Absolute: 0 10*3/uL (ref 0.0–0.5)
Eosinophils Relative: 1 %
HCT: 31.9 % — ABNORMAL LOW (ref 36.0–46.0)
Hemoglobin: 9.6 g/dL — ABNORMAL LOW (ref 12.0–15.0)
Immature Granulocytes: 0 %
Lymphocytes Relative: 13 %
Lymphs Abs: 0.6 10*3/uL — ABNORMAL LOW (ref 0.7–4.0)
MCH: 22.1 pg — ABNORMAL LOW (ref 26.0–34.0)
MCHC: 30.1 g/dL (ref 30.0–36.0)
MCV: 73.5 fL — ABNORMAL LOW (ref 80.0–100.0)
Monocytes Absolute: 0.5 10*3/uL (ref 0.1–1.0)
Monocytes Relative: 9 %
Neutro Abs: 3.7 10*3/uL (ref 1.7–7.7)
Neutrophils Relative %: 77 %
Platelet Count: 76 10*3/uL — ABNORMAL LOW (ref 150–400)
RBC: 4.34 MIL/uL (ref 3.87–5.11)
RDW: 17.2 % — ABNORMAL HIGH (ref 11.5–15.5)
WBC Count: 4.8 10*3/uL (ref 4.0–10.5)
nRBC: 0 % (ref 0.0–0.2)

## 2023-11-29 LAB — URIC ACID: Uric Acid, Serum: 4.9 mg/dL (ref 2.5–7.1)

## 2023-11-29 LAB — LACTATE DEHYDROGENASE: LDH: 121 U/L (ref 98–192)

## 2023-11-29 MED ORDER — DIPHENHYDRAMINE HCL 25 MG PO CAPS
25.0000 mg | ORAL_CAPSULE | Freq: Once | ORAL | Status: AC
  Administered 2023-11-29: 25 mg via ORAL
  Filled 2023-11-29: qty 1

## 2023-11-29 MED ORDER — ZOSTER VAC RECOMB ADJUVANTED 50 MCG/0.5ML IM SUSR
INTRAMUSCULAR | 1 refills | Status: AC
  Filled 2023-11-29: qty 1, 30d supply, fill #1
  Filled 2023-11-29: qty 1, 30d supply, fill #0
  Filled 2024-02-02: qty 1, 30d supply, fill #1

## 2023-11-29 MED ORDER — IMMUNE GLOBULIN (HUMAN) 10 GM/100ML IV SOLN
400.0000 mg/kg | INTRAVENOUS | Status: DC
  Administered 2023-11-29: 40 g via INTRAVENOUS
  Filled 2023-11-29: qty 400

## 2023-11-29 MED ORDER — ACETAMINOPHEN 325 MG PO TABS
650.0000 mg | ORAL_TABLET | Freq: Once | ORAL | Status: AC
Start: 2023-11-29 — End: 2023-11-29
  Administered 2023-11-29: 650 mg via ORAL
  Filled 2023-11-29: qty 2

## 2023-11-29 MED ORDER — ONDANSETRON HCL 4 MG/2ML IJ SOLN
4.0000 mg | Freq: Once | INTRAMUSCULAR | Status: AC
  Administered 2023-11-29: 4 mg via INTRAVENOUS
  Filled 2023-11-29: qty 2

## 2023-11-29 MED ORDER — METHYLPREDNISOLONE SODIUM SUCC 40 MG IJ SOLR
40.0000 mg | Freq: Every day | INTRAMUSCULAR | Status: DC
  Administered 2023-11-29: 40 mg via INTRAVENOUS
  Filled 2023-11-29: qty 1

## 2023-11-29 NOTE — Progress Notes (Signed)
 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  INTERVAL HISTORY:  Janice [name] is a 73 y.o. 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  Patient was last seen by me on 10/03/2023 and reported an enlarged lymph node on her left shoulder and two enlarged lymph nodes in the neck which resolved on their own. She also complained of lethargy one day after IVIG, left lower abdominal tenderness on palpation, and headaches which had resolved. Patient also reported small amount of bright red blood on her tissue due to her hemorrhoids. She noted that she had acute Diverticulitis around mid-December and was prescribed antibiotics. Patient noted she had 3 mechanical falls in 3 months, which her PCP attributed to low blood pressure.   Today, she reports endorsing a generalized skin rash. Patient connected with Vanessa Kick RN on 11/09/2023 and she held Venetoclax per Dr. Candise Che. She has been off of venetoclax for 7-8 days. Patient has since restarted venetoclax with no skin rash issues.   She reports that her symptoms began with itching in the ankle with no rash initially. The next morning, she endorsed burning sensation in the shoulder and felt like her scalp was "pulled up". Her symptoms were painful for two days.   She denies any itching in the privates. Patient does endorse mouth sores which are painful but not itchy. Patient denies crawling sensation with her skin rash. She denies any cough or cold. Patient has not receives any vaccines recently around time of skin rash issues. She denies  starting any new medications at that time, or new cosmetics, soaps, insect exposure, or new pets, which could trigger an allergic reaction. She denies any food allergies. Patient notes working in the yard this week though she has not previously.  She reports using 0.25% steroid cream. Benadryl improved symptoms after 3 days. Her symptoms then spread to the knees and back, then eventually improved.   Patient followed up with her PCP yesterday and was told that she did not have shingles. She reports that she did endorse shingles previously and reports different symptoms. With shingles, she endorsed a mild rash on her right hip with burning sensation for three months.  Since restarting venetoclax, she denies any new rashes or itching.  She reports endorsing dizziness and  lightheadedness and reports that she previously cut her blood pressure medication tablet in half.   She denies any back pain, abdominal pain, changes in bowel habits, leg swelling, black stools or blood in stools.   She reports that she switched Zyrtec to Claritin. She reports that she continues to take allopurinol. She reports that she is on acid suppressants at this time.  MEDICAL HISTORY:  Past Medical History:  Diagnosis Date   Allergy    Asthma    seasonal   Cellulitis 2013   Left toe   Chronic kidney disease    stage 1   Cirrhosis (HCC)    Common migraine    History of   Complication of anesthesia    Per pt/had breathing problems with "block" during rotator cuff surgery. 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   Coronary artery disease    DDD (degenerative disc  disease)    Depression    denies takes paxil for migraines   Diabetes mellitus    type 2   Diabetic peripheral neuropathy (HCC)    Difficult airway for intubation 10/20/2022   DLx 1 with MAC 4, unable to see cords, DL with lopro S3 glidescope, easy atraumatic intubation   Difficult intubation 03/03/2023   Diverticulitis    Diverticulosis    DJD  (degenerative joint disease)    Esophageal varices (HCC)    GERD (gastroesophageal reflux disease)    History of colon polyps    hyperplastic   History of gastric polyp    Hyperlipidemia    Hypertension    Iron deficiency anemia    Obesity    OSA on CPAP    cpap   Peripheral neuropathy    Pneumonia    april 2020  mild   Primary localized osteoarthritis of left knee 03/27/2019   Sensorineural hearing loss    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HCC)     SURGICAL HISTORY: Past Surgical History:  Procedure Laterality Date   ANKLE SURGERY     Left    BREAST BIOPSY Left 2021   left axilla, lymphoma   COLONOSCOPY  05/2018   ESOPHAGOGASTRODUODENOSCOPY (EGD) WITH PROPOFOL N/A 03/09/2023   Procedure: ESOPHAGOGASTRODUODENOSCOPY (EGD) WITH PROPOFOL;  Surgeon: Iva Boop, MD;  Location: [facility];  Service: Gastroenterology;  Laterality: N/A;   LEEP N/A 09/14/2018   Procedure: LOOP ELECTROSURGICAL EXCISION PROCEDURE (LEEP);  Surgeon: Richardean Chimera, MD;  Location: [facility];  Service: Gynecology;  Laterality: N/A;   MASS EXCISION Right 06/24/2021   Procedure: EXCISION RIGHT POSTERIOR NECK MASS;  Surgeon: Kinsinger, De Blanch, MD;  Location: [facility];  Service: General;  Laterality: Right;   ROTATOR CUFF REPAIR Bilateral 2012, 2015   TONSILLECTOMY     TOTAL KNEE ARTHROPLASTY Left 04/08/2019   Procedure: TOTAL KNEE ARTHROPLASTY;  Surgeon: Salvatore Marvel, MD;  Location: [facility];  Service: Orthopedics;  Laterality: Left;   UMBILICAL HERNIA REPAIR N/A 10/20/2022   Procedure: OPEN HERNIA REPAIR UMBILICAL ADULT with Mesh;  Surgeon: Kinsinger, De Blanch, MD;  Location: [facility];  Service: General;  Laterality: N/A;   UPPER GI ENDOSCOPY  05/2018    SOCIAL HISTORY: Social History   Socioeconomic History   Marital status: Widowed    Spouse name: Not on file   Number of children: 0   Years of education: 3 years college   Highest education level: Not on file  Occupational History    Occupation: Unemployed  Tobacco Use   Smoking status: Never    Passive exposure: Past   Smokeless tobacco: Never  Vaping Use   Vaping status: Never Used  Substance and Sexual Activity   Alcohol use: Not Currently    Comment: very rare   Drug use: No   Sexual activity: Not Currently    Birth control/protection: None, Post-menopausal  Other Topics Concern   Not on file  Social History Narrative   1 caffeine drink daily    Right-handed   widow (husband died from [place] cirrhosis)   Social Drivers of Health   Financial Resource Strain: Medium Risk (08/20/2020)   Overall Financial Resource Strain (CARDIA)    Difficulty of Paying Living Expenses: Somewhat hard  Food Insecurity: No Food Insecurity (10/20/2022)   Hunger Vital Sign    Worried About Running Out of Food in the Last Year: Never true    Ran Out of Food in the Last Year: Never true  Transportation Needs: No Transportation Needs (10/20/2022)   PRAPARE - Administrator, Civil Service (Medical): No    Lack of Transportation (Non-Medical): No  Physical Activity: Not on file  Stress: Not on file  Social Connections: Unknown (01/25/2022)   [facility] from [facility]   Social Network    Social Network: Not on file  Intimate Partner Violence: Not At Risk (10/20/2022)   Humiliation, Afraid, Rape, and Kick questionnaire    Fear of Current or Ex-Partner: No    Emotionally Abused: No    Physically Abused: No    Sexually Abused: No    FAMILY HISTORY: Family History  Problem Relation Age of Onset   Breast cancer Mother    Bone cancer Mother    Heart failure Father    Diabetes Mellitus II Father    Hypertension Father    Diabetes Mellitus II Sister    Obesity Sister    Other Sister        retina problem   Hypertension Brother    Diabetes Mellitus II Brother    Colon cancer Neg Hx    Rectal cancer Neg Hx    Stomach cancer Neg Hx    Esophageal cancer Neg Hx    Pancreatic cancer Neg Hx    Liver  disease Neg Hx     ALLERGIES:  is allergic to breo ellipta [fluticasone furoate-vilanterol], gazyva [obinutuzumab], iodine, latex, tessalon [benzonatate], zocor [simvastatin], levaquin [levofloxacin], penicillins, povidone-iodine, wellbutrin [bupropion], amoxicillin, chlorhexidine, diflucan [fluconazole], thalitone [chlorthalidone], amoxicillin-pot clavulanate, and vibra-tab [doxycycline].  MEDICATIONS:  Current Outpatient Medications  Medication Sig Dispense Refill   albuterol (VENTOLIN HFA) 108 (90 Base) MCG/ACT inhaler Inhale 1-2 puffs into the lungs every 6 (six) hours as needed (Cough). 18 g 0   allopurinol (ZYLOPRIM) 100 MG tablet Take 1 tablet (100 mg) by mouth 2 times daily. 60 tablet 1   amLODipine (NORVASC) 5 MG tablet Take 5 mg by mouth daily.     aspirin EC 81 MG tablet Take 1 tablet (81 mg total) by mouth daily. Swallow whole. 90 tablet 3   azelastine (ASTELIN) 0.1 % nasal spray Place 1 spray into both nostrils 2 (two) times daily. Use in each nostril as directed 9 mL 11   b complex vitamins capsule Take 1 capsule by mouth in the morning.     bacitracin 500 UNIT/GM ointment Apply topically 2 (two) times daily for 10 days 30 g 0   budesonide-formoterol (SYMBICORT) 80-4.5 MCG/ACT inhaler Inhale 2 puffs into the lungs daily. (Patient taking differently: Inhale 2 puffs into the lungs 2 (two) times daily. Patient usually does 2 puffs in morning and 1 puff in evening.) 10.2 g 12   Carboxymethylcellul-Glycerin (REFRESH OPTIVE PF OP) Place 1 drop into both eyes 3 (three) times daily as needed (dry/irritated eyes.). Non-pres     cephALEXin (KEFLEX) 500 MG capsule Take 2 capsules (1,000 mg total) by mouth as directed 1 hour prior to dental work. 20 capsule 0   cetirizine (ZYRTEC) 10 MG tablet Take 5 mg by mouth in the morning.     Coenzyme Q10 (CO Q-10) 100 MG CAPS Take 100 mg by mouth in the morning.     Continuous Glucose Sensor (FREESTYLE LIBRE 2 SENSOR) MISC Replace every 14 days and use  to check blood sugar continuously. 2 each 3   Continuous Glucose Sensor (FREESTYLE LIBRE 2 SENSOR) MISC Replace every 14 days and use to check blood sugar continuously. 6 each 3   desoximetasone (  TOPICORT) 0.25 % cream Apply a small amount to skin twice a day as needed for flares 60 g 0   diclofenac Sodium (VOLTAREN ARTHRITIS PAIN) 1 % GEL Apply topically 4 (four) times daily to bilateral knees and ankles 200 g 5   diclofenac sodium (VOLTAREN) 1 % GEL Apply 2 g topically 2 (two) times daily as needed (knee pain/ foot /Triger finger).  2   empagliflozin (JARDIANCE) 10 MG TABS tablet Take 10 mg by mouth in the morning.     empagliflozin (JARDIANCE) 10 MG TABS tablet Take 1 tablet (10 mg total) by mouth daily. 90 tablet 3   fluticasone (FLONASE) 50 MCG/ACT nasal spray Place 2 sprays into both nostrils in the morning. 48 g 3   furosemide (LASIX) 20 MG tablet Take 1 tablet (20 mg total) by mouth daily. 90 tablet 3   gabapentin (NEURONTIN) 300 MG capsule Take 1 capsule (300 mg total) by mouth every morning AND 2 capsules (600 mg total) at bedtime. 180 capsule 1   glucose blood test strip Use to test blood sugar 3 times daily as directed 300 each 3   hydrALAZINE (APRESOLINE) 25 MG tablet Take 1 tablet (25 mg total) by mouth 2 (two) times daily. 180 tablet 2   hydrocortisone (ANUSOL-HC) 25 MG suppository Place 1 suppository (25 mg total) rectally daily. 30 suppository 0   influenza vaccine adjuvanted (FLUAD) 0.5 ML injection Inject into the muscle. 0.5 mL 0   insulin aspart (NOVOLOG FLEXPEN) 100 UNIT/ML FlexPen Inject 30 Units into the skin daily with breakfast AND 30 Units daily with lunch AND 30 Units daily with supper. 90 mL 3   insulin aspart (NOVOLOG FLEXPEN) 100 UNIT/ML FlexPen Inject 30 Units into the skin daily with breakfast AND 30 Units daily with lunch AND 30 Units daily with supper. 45 mL 3   insulin degludec (TRESIBA FLEXTOUCH) 200 UNIT/ML FlexTouch Pen Inject 80 Units into the skin daily. 30 mL  11   Insulin Pen Needle 32G X 4 MM MISC Use as directed 5 (five) times daily. 400 each 3   irbesartan (AVAPRO) 300 MG tablet Take 1 tablet (300 mg total) by mouth in the morning. 90 tablet 3   Lancets (ONETOUCH DELICA PLUS LANCET33G) MISC Use to test blood sugar 3 times daily 300 each 2   LORazepam (ATIVAN) 0.5 MG tablet Take 1-2 tablets (0.5-1 mg total) by mouth once as needed for up to 1 dose (prior to MRI or CT scan for claustrophobia). 4 tablet 0   metFORMIN (GLUCOPHAGE-XR) 500 MG 24 hr tablet Take 2 tablets (1,000 mg total) by mouth 2 (two) times daily. 360 tablet 3   omeprazole (PRILOSEC) 40 MG capsule Take 1 capsule (40 mg total) by mouth 2 (two) times daily. 180 capsule 3   ondansetron (ZOFRAN-ODT) 4 MG disintegrating tablet Dissolve 1 tablet (4 mg) by mouth every 8 hours as needed for nausea or vomiting. 20 tablet 0   rosuvastatin (CRESTOR) 10 MG tablet Take 1 tablet (10 mg total) by mouth daily. 90 tablet 3   sertraline (ZOLOFT) 50 MG tablet Take 1 tablet (50 mg total) by mouth daily. (Patient taking differently: Take 50 mg by mouth every evening.) 90 tablet 3   spironolactone (ALDACTONE) 50 MG tablet Take 1 tablet (50 mg total) by mouth daily. 90 tablet 3   terconazole (TERAZOL 3) 0.8 % vaginal cream Place 1 applicator vaginally daily as needed (yeast infections).     venetoclax (VENCLEXTA) 100 MG tablet Take 1 tablet (  100 mg total) by mouth daily. Tablets should be swallowed whole with a meal and a full glass of water. 30 tablet 3   No current facility-administered medications for this visit.    REVIEW OF SYSTEMS:    10 Point review of Systems was done is negative except as noted above.   PHYSICAL EXAMINATION:  .There were no vitals taken for this visit.   GENERAL:alert, in no acute distress and comfortable SKIN: no acute rashes, no significant lesions EYES: conjunctiva are pink and non-injected, sclera anicteric OROPHARYNX: MMM, no exudates, no oropharyngeal erythema or  ulceration NECK: supple, no JVD LYMPH:  no palpable lymphadenopathy in the cervical, axillary or inguinal regions LUNGS: clear to auscultation b/l with normal respiratory effort HEART: regular rate & rhythm ABDOMEN:  normoactive bowel sounds , non tender, not distended. Extremity: no pedal edema PSYCH: alert & oriented x 3 with fluent speech NEURO: no focal motor/sensory deficits    LABORATORY DATA:  I have reviewed the data as listed  .    Latest Ref Rng & Units 10/30/2023    1:33 PM 10/03/2023   12:22 PM 09/07/2023   12:07 PM  CBC  WBC 4.0 - 10.5 K/uL 4.6  4.6  5.4   Hemoglobin 12.0 - 15.0 g/dL 9.5  9.9  9.6   Hematocrit 36.0 - 46.0 % 32.1  33.4  32.1   Platelets 150 - 400 K/uL 63  67  81    . CBC    Component Value Date/Time   WBC 4.6 10/30/2023 1333   WBC 56.9 (HH) 10/20/2022 1148   RBC 4.29 10/30/2023 1333   HGB 9.5 (L) 10/30/2023 1333   HGB 10.3 (L) 10/18/2021 1222   HCT 32.1 (L) 10/30/2023 1333   HCT 33.0 (L) 10/18/2021 1222   PLT 63 (L) 10/30/2023 1333   PLT 100 (LL) 10/18/2021 1222   MCV 74.8 (L) 10/30/2023 1333   MCV 84 10/18/2021 1222   MCH 22.1 (L) 10/30/2023 1333   MCHC 29.6 (L) 10/30/2023 1333   RDW 16.8 (H) 10/30/2023 1333   RDW 15.1 10/18/2021 1222   LYMPHSABS 0.6 (L) 10/30/2023 1333   MONOABS 0.5 10/30/2023 1333   EOSABS 0.1 10/30/2023 1333   BASOSABS 0.0 10/30/2023 1333     .    Latest Ref Rng & Units 10/30/2023    1:33 PM 10/03/2023   12:22 PM 09/07/2023   12:07 PM  CMP  Glucose 70 - 99 mg/dL 161  096  045   BUN 8 - 23 mg/dL 25  20  24    Creatinine 0.44 - 1.00 mg/dL 4.09  8.11  9.14   Sodium 135 - 145 mmol/L 137  138  136   Potassium 3.5 - 5.1 mmol/L 4.4  4.6  4.4   Chloride 98 - 111 mmol/L 106  107  104   CO2 22 - 32 mmol/L 24  23  26    Calcium 8.9 - 10.3 mg/dL 9.1  9.0  9.0   Total Protein 6.5 - 8.1 g/dL 6.4  6.7  6.6   Total Bilirubin 0.0 - 1.2 mg/dL 0.5  0.5  0.4   Alkaline Phos 38 - 126 U/L 98  88  65   AST 15 - 41 U/L 19  18   22    ALT 0 - 44 U/L 24  20  24     . Lab Results  Component Value Date   LDH 128 10/03/2023   . Lab Results  Component Value Date  IRON 29 10/03/2023   TIBC 501 (H) 10/03/2023   IRONPCTSAT 6 (L) 10/03/2023   (Iron and TIBC)  Lab Results  Component Value Date   FERRITIN 7 (L) 10/03/2023    04/08/2020 FISH/CLL Panel:   03/30/2020 Left Axilla Flow Pathology Report [id number]):   03/30/2020 Left axilla lymph node Bx ([id number]):   RADIOGRAPHIC STUDIES: I have personally reviewed the radiological images as listed and agreed with the findings in the report. MM 3D SCREENING MAMMOGRAM BILATERAL BREAST Result Date: 11/22/2023 CLINICAL DATA:  Screening. Patient has known small cell lymphocytic lymphoma. EXAM: DIGITAL SCREENING BILATERAL MAMMOGRAM WITH TOMOSYNTHESIS AND CAD TECHNIQUE: Bilateral screening digital craniocaudal and mediolateral oblique mammograms were obtained. Bilateral screening digital breast tomosynthesis was performed. The images were evaluated with computer-aided detection. COMPARISON:  Previous exam(s). ACR Breast Density Category b: There are scattered areas of fibroglandular density. FINDINGS: There are no findings suspicious for malignancy in either breast. Bilateral axillary adenopathy is stable. IMPRESSION: No mammographic evidence of malignancy in either breast. Stable bilateral axillary adenopathy. A result letter of this screening mammogram will be mailed directly to the patient. RECOMMENDATION: Screening mammogram in one year. (Code:SM-B-01Y) BI-RADS CATEGORY  2: Benign. Electronically Signed   By: Baird Lyons M.D.   On: 11/22/2023 10:54    MRI abdomen with and without contrast 07/08/2021 showed IMPRESSION: 1. Moderate motion and patient body habitus degraded exam. 2. Extensive abdominal adenopathy, similar to slightly increased compared to 05/01/2020 PET. 3. Cirrhosis and hepatosplenomegaly with suspicion of periesophageal varices. 4. Nonspecific  hypoenhancing or nonenhancing splenic lesions. These could simply represent Hessie Knows bodies in the setting of cirrhosis. Lymphomatous involvement cannot be excluded. 5. Heterogeneous enhancement throughout the liver, without well-defined suspicious mass. Possibly due to altered perfusion in the setting of cirrhosis and porta hepatis adenopathy. Lymphomatous involvement cannot be excluded. Consider pre and post-contrast MRI follow-up at 2-3 months. A more aggressive approach would include random liver biopsy.  MRI abdomen with and without contrast 02/04/2022 showed IMPRESSION: 1. Examination is generally limited by breath motion artifact, particularly multiphasic contrast enhanced sequences. Within this limitation, cirrhotic morphology of the liver without focal liver lesion. 2. 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 3. Trace ascites. 4. Numerous enlarged gastrohepatic ligament, portacaval, retroperitoneal, and small bowel mesenteric lymph nodes are unchanged, in keeping with history of lymphoma.  ASSESSMENT & PLAN:   73 yo female with   1) CLL/SLL -02/28/2020 MM Breast ([id number]) revealed "Bilateral axillary adenopathy." -03/30/2020 Left axilla lymph node biopsy ([id number]) revealed "CHRONIC LYMPHOCYTIC LYMPHOMA/SMALL LYMPHOCYTIC LYMPHOMA".  -03/30/2020 Left Axilla Flow Pathology Report [id number]) revealed "Monoclonal B-cell population with coexpression of CD5 comprises 80% of all lymphocytes". -04/20/20 PET/CT revealed 1. Adenopathy within the neck, chest, abdomen, and pelvis, consistent with active lymphoma. Much of this is not significantly hypermetabolic. Some upper abdominal nodes are moderately hypermetabolic.  2) Mild thrombocytopenia Most likely related to advanced liver cirrhosis and partly could also be from CLL  #3 severe hypogammaglobinemia due to CLL IgG  level 272 Component     Latest Ref Rng 09/20/2022  IgG (Immunoglobin G), Serum     586 - 1,602 mg/dL 841 (L)   IgA     64 - 422 mg/dL 15 (L)   IgM (Immunoglobulin M), Srm     26 - 217 mg/dL <5 (L)     Legend: (L) Low  #4 iron deficiency Anemia-- from Epistaxis + ?GI losses. NO overt GI bleeding  noted. . Lab Results  Component Value Date   IRON 29 10/03/2023   TIBC 501 (H) 10/03/2023   IRONPCTSAT 6 (L) 10/03/2023   (Iron and TIBC)  Lab Results  Component Value Date   FERRITIN 7 (L) 10/03/2023    PLAN:  -Discussed lab results on 11/29/23 in detail with patient. CBC showed WBC of 4.8K, hemoglobin of 9.6, and platelets of 76K. -WBCs normal -platelets are low though they have improved. Platelets are low primarily from liver cirrhosis -patient is anemic and becoming more microcytic. Her last ferritin was low at 7 in January 2025.  -discussed that she is likely becoming more iron deficient -recommend iron replacement -patient is agreeable to setting up IV iron  -will set up patient for additional IV iron replacement -LDH stable -sodium level is mildly low which is likely from combination of factors including medications such as diuretics -discussed that her dizziness/lightheadedness is most likely due to low blood pressure and her anemia to some degree -discussed that there may be a role to connect with a gastroenterologist if she experiences persistent itching.  -patient is tolerating venetoclax well with no major toxicity issues.  -continue 100 MG Venetoclax po daily.  -continue IVIG monthly -will stop allopurinol given that it may cause itching and rashes -advised patient to let us know if there is any new concern for itching -answered all of patient's questions in detail -patient shall return to clinic in 2 months  FOLLOW-UP: RTC with Dr Candise Che with labs in 2 months -continue monthly IVG x 4  The total time spent in the appointment was *** minutes* .  All of the  patient's questions were answered with apparent satisfaction. The patient knows to call the clinic with any problems, questions or concerns.   Wyvonnia Lora MD MS AAHIVMS [facility] Hematology/Oncology Physician [facility]  .*Total Encounter Time as defined by the Centers for Medicare and Medicaid Services includes, in addition to the face-to-face time of a patient visit (documented in the note above) non-face-to-face time: obtaining and reviewing outside history, ordering and reviewing medications, tests or procedures, care coordination (communications with other health care professionals or caregivers) and documentation in the medical record.    I,Mitra Faeizi,acting as a Neurosurgeon for Wyvonnia Lora, MD.,have documented all relevant documentation on the behalf of Wyvonnia Lora, MD,as directed by  Wyvonnia Lora, MD while in the presence of Wyvonnia Lora, MD.  ***

## 2023-11-29 NOTE — Patient Instructions (Signed)
 Immune Globulin Injection What is this medication? IMMUNE GLOBULIN (im MUNE GLOB yoo lin) treats many immune system conditions. It works by Designer, multimedia extra antibodies. Antibodies are proteins made by the immune system that help protect the body. This medicine may be used for other purposes; ask your health care provider or pharmacist if you have questions. COMMON BRAND NAME(S): ASCENIV, Baygam, BIVIGAM, Carimune, Carimune NF, cutaquig, Cuvitru, Flebogamma, Flebogamma DIF, GamaSTAN, GamaSTAN S/D, Gamimune N, Gammagard, Gammagard S/D, Gammaked, Gammaplex, Gammar-P IV, Gamunex, Gamunex-C, Hizentra, Iveegam, Iveegam EN, Octagam, Panglobulin, Panglobulin NF, panzyga, Polygam S/D, Privigen, Sandoglobulin, Venoglobulin-S, Vigam, Vivaglobulin, Xembify What should I tell my care team before I take this medication? They need to know if you have any of these conditions: Blood clotting disorder Condition where you have excess fluid in your body, such as heart failure or edema Dehydration Diabetes Have had blood clots Heart disease Immune system conditions Kidney disease Low levels of IgA Recent or upcoming vaccine An unusual or allergic reaction to immune globulin, other medications, foods, dyes, or preservatives Pregnant or trying to get pregnant Breastfeeding How should I use this medication? This medication is infused into a vein or under the skin. It is usually given by your care team in a hospital or clinic setting. It may also be given at home. If you get this medication at home, you will be taught how to prepare and give it. Use exactly as directed. Take it as directed on the prescription label at the same time every day. Keep taking it unless your care team tells you to stop. It is important that you put your used needles and syringes in a special sharps container. Do not put them in a trash can. If you do not have a sharps container, call your pharmacist or care team to get one. Talk to  your care team about the use of this medication in children. While it may be given to children for selected conditions, precautions do apply. Overdosage: If you think you have taken too much of this medicine contact a poison control center or emergency room at once. NOTE: This medicine is only for you. Do not share this medicine with others. What if I miss a dose? If you get this medication at the hospital or clinic: It is important not to miss your dose. Call your care team if you are unable to keep an appointment. If you give yourself this medication at home: If you miss a dose, take it as soon as you can. Then continue your normal schedule. If it is almost time for your next dose, take only that dose. Do not take double or extra doses. Call your care team with questions. What may interact with this medication? Live virus vaccines This list may not describe all possible interactions. Give your health care provider a list of all the medicines, herbs, non-prescription drugs, or dietary supplements you use. Also tell them if you smoke, drink alcohol, or use illegal drugs. Some items may interact with your medicine. What should I watch for while using this medication? Your condition will be monitored carefully while you are receiving this medication. Tell your care team if your symptoms do not start to get better or if they get worse. You may need blood work done while you are taking this medication. This medication increases the risk of blood clots. People with heart, blood vessel, or blood clotting conditions are more likely to develop a blood clot. Other risk factors include advanced age, estrogen  use, tobacco use, lack of movement, and being overweight. This medication can decrease the response to a vaccine. If you need to get vaccinated, tell your care team if you have received this medication within the last year. Extra booster doses may be needed. Talk to your care team to see if a different  vaccination schedule is needed. If you have diabetes, you may get a falsely elevated blood sugar reading. Talk to your care team about how to check your blood sugar while taking this medication. What side effects may I notice from receiving this medication? Side effects that you should report to your care team as soon as possible: Allergic reactions--skin rash, itching, hives, swelling of the face, lips, tongue, or throat Blood clot--pain, swelling, or warmth in the leg, shortness of breath, chest pain Fever, neck pain or stiffness, sensitivity to light, headache, nausea, vomiting, confusion, which may be signs of meningitis Hemolytic anemia--unusual weakness or fatigue, dizziness, headache, trouble breathing, dark urine, yellowing skin or eyes Kidney injury--decrease in the amount of urine, swelling of the ankles, hands, or feet Low sodium level--muscle weakness, fatigue, dizziness, headache, confusion Shortness of breath or trouble breathing, cough, unusual weakness or fatigue, blue skin or lips Side effects that usually do not require medical attention (report these to your care team if they continue or are bothersome): Chills Diarrhea Fever Headache Nausea This list may not describe all possible side effects. Call your doctor for medical advice about side effects. You may report side effects to FDA at 1-800-FDA-1088. Where should I keep my medication? Keep out of the reach of children and pets. You will be instructed on how to store this medication. Get rid of any unused medication after the expiration date. To get rid of medications that are no longer needed or have expired: Take the medication to a medication take-back program. Check with your pharmacy or law enforcement to find a location. If you cannot return the medication, ask your pharmacist or care team how to get rid of this medication safely. NOTE: This sheet is a summary. It may not cover all possible information. If you have  questions about this medicine, talk to your doctor, pharmacist, or health care provider.  2024 Elsevier/Gold Standard (2023-08-11 00:00:00)

## 2023-12-04 DIAGNOSIS — G4733 Obstructive sleep apnea (adult) (pediatric): Secondary | ICD-10-CM | POA: Diagnosis not present

## 2023-12-05 ENCOUNTER — Encounter: Payer: Self-pay | Admitting: Internal Medicine

## 2023-12-05 ENCOUNTER — Other Ambulatory Visit: Payer: Self-pay

## 2023-12-05 ENCOUNTER — Other Ambulatory Visit (HOSPITAL_COMMUNITY): Payer: Self-pay

## 2023-12-05 NOTE — Progress Notes (Signed)
 Specialty Pharmacy Refill Coordination Note  Janice [name] is a 73 y.o. female contacted today regarding refills of specialty medication(s) Venetoclax [facility])   Patient requested (Patient-Rptd) Delivery   Delivery date: (Patient-Rptd) 12/22/23   Verified address: (Patient-Rptd) [address] [place] [address]   Medication will be filled on 12/21/23.

## 2023-12-06 ENCOUNTER — Encounter: Payer: Self-pay | Admitting: Hematology

## 2023-12-06 ENCOUNTER — Telehealth: Payer: Self-pay | Admitting: Pharmacy Technician

## 2023-12-06 NOTE — Telephone Encounter (Signed)
 Beth Fyi note:  patient will be scheduled as soon as possible.  Auth Submission: NO AUTH NEEDED Site of care: Site of care: [facility] Payer: [employer] AD[employer] Medication & CPT/J Code(s) submitted: Feraheme (ferumoxytol) [id number] Route of submission (phone, fax, portal):  Phone # Fax # Auth type: Buy/Bill PB Units/visits requested: 2 DOSES Reference number:  Approval from: 12/06/23 - 05/07/24

## 2023-12-09 ENCOUNTER — Other Ambulatory Visit (HOSPITAL_BASED_OUTPATIENT_CLINIC_OR_DEPARTMENT_OTHER): Payer: Self-pay

## 2023-12-09 ENCOUNTER — Other Ambulatory Visit (HOSPITAL_COMMUNITY): Payer: Self-pay

## 2023-12-12 ENCOUNTER — Other Ambulatory Visit (HOSPITAL_COMMUNITY): Payer: Self-pay

## 2023-12-13 ENCOUNTER — Other Ambulatory Visit (HOSPITAL_BASED_OUTPATIENT_CLINIC_OR_DEPARTMENT_OTHER): Payer: Self-pay

## 2023-12-13 ENCOUNTER — Other Ambulatory Visit: Payer: Self-pay

## 2023-12-13 ENCOUNTER — Other Ambulatory Visit (HOSPITAL_COMMUNITY): Payer: Self-pay

## 2023-12-13 MED ORDER — AMLODIPINE BESYLATE 5 MG PO TABS
5.0000 mg | ORAL_TABLET | Freq: Every day | ORAL | 3 refills | Status: DC
  Filled 2023-12-13: qty 90, 90d supply, fill #0

## 2023-12-13 MED ORDER — AMLODIPINE BESYLATE 10 MG PO TABS
5.0000 mg | ORAL_TABLET | Freq: Every day | ORAL | 3 refills | Status: DC
  Filled 2023-12-13: qty 45, 90d supply, fill #0

## 2023-12-14 ENCOUNTER — Ambulatory Visit

## 2023-12-14 VITALS — BP 107/62 | HR 84 | Temp 97.9°F | Resp 16 | Ht 64.0 in | Wt 207.8 lb

## 2023-12-14 DIAGNOSIS — D509 Iron deficiency anemia, unspecified: Secondary | ICD-10-CM

## 2023-12-14 DIAGNOSIS — D508 Other iron deficiency anemias: Secondary | ICD-10-CM

## 2023-12-14 MED ORDER — SODIUM CHLORIDE 0.9 % IV SOLN
510.0000 mg | Freq: Once | INTRAVENOUS | Status: AC
  Administered 2023-12-14: 510 mg via INTRAVENOUS
  Filled 2023-12-14: qty 17

## 2023-12-14 MED ORDER — DIPHENHYDRAMINE HCL 25 MG PO CAPS
25.0000 mg | ORAL_CAPSULE | Freq: Once | ORAL | Status: AC
  Administered 2023-12-14: 25 mg via ORAL
  Filled 2023-12-14: qty 1

## 2023-12-14 MED ORDER — ACETAMINOPHEN 325 MG PO TABS
650.0000 mg | ORAL_TABLET | Freq: Once | ORAL | Status: AC
  Administered 2023-12-14: 650 mg via ORAL
  Filled 2023-12-14: qty 2

## 2023-12-14 NOTE — Progress Notes (Signed)
 Diagnosis: Acute Anemia  Provider:  Chilton Greathouse MD  Procedure: IV Infusion  IV Type: Peripheral, IV Location: L Antecubital  Feraheme (Ferumoxytol), Dose: 510 mg  Infusion Start Time: 1428  Infusion Stop Time: 1445  Post Infusion IV Care: Observation period completed and Peripheral IV Discontinued  Discharge: Condition: Good, Destination: Home . AVS Declined  Performed by:  Nat Math, RN

## 2023-12-15 ENCOUNTER — Telehealth: Payer: Self-pay | Admitting: Cardiology

## 2023-12-15 ENCOUNTER — Other Ambulatory Visit (HOSPITAL_COMMUNITY): Payer: Self-pay

## 2023-12-15 NOTE — Telephone Encounter (Signed)
 Pt states she has been having dizzy spells and will at random times feel like she is having a rapid HR. Pt states she is experiencing more dizziness than rapid HR. She will experience it randomly including while at rest. While on the phone, pt bent over to get something and stated she felt dizzy when she stood up but that doesn't typically happen. Asked pt to take BP while on the phone:  4/3 BP 106/60 at infusion center  4/4 BP 140/73 HR 85  Explained to pt that I will discuss with our DOD (Dr. Lynnette Caffey) because Dr. Odis Hollingshead is out of the office this week. Pt verbalized understanding.  Current BP meds:   Amlodipine 5 mg once daily

## 2023-12-15 NOTE — Telephone Encounter (Signed)
 STAT if patient feels like he/she is going to faint   1. Are you feeling dizzy, lightheaded, or faint right now? No    2. Have you passed out?  No (If yes move to .SYNCOPECHMG)   3. Do you have any other symptoms? Rapid HR at times   4. Have you checked your HR and BP (record if available)? 106/60 yesterday at infusion appt. PCP wanted pt to contact our office regarding symptoms

## 2023-12-15 NOTE — Telephone Encounter (Signed)
 Spoke with pt and explained Dr. Lynnette Caffey advised to hold Amlodipine 5 mg for now and see how her symptoms are. Pt verbalized understanding of plan. Advised pt to take BP and record reading as well as HR in AM and PM for the next week while off of Amlodipine. Explained to pt that over the weekend if her symptoms get worse or her BP spikes and stays elevated to go to an urgent care or ED to be evaluated. Pt verbalized understanding of plan and agreed.

## 2023-12-16 NOTE — Telephone Encounter (Signed)
 Above thread noted BP meds: Norvasc.   Review of the last med rec she is on Amlodipine, Jardiance, Lasix, Avapro, spironolactone, hydralazine.  All these medications can affect blood pressures.  Please reconcile her medications and find out what she takes when (a.m. versus p.m.)  Janice Lerner, DO, [facility]

## 2023-12-18 ENCOUNTER — Other Ambulatory Visit (HOSPITAL_COMMUNITY): Payer: Self-pay

## 2023-12-19 NOTE — Telephone Encounter (Signed)
 Attempted to call pt to go over medications and discuss when the pt takes each medication (Am vs PM). LMTCB so we could go over this information.

## 2023-12-20 ENCOUNTER — Other Ambulatory Visit: Payer: Self-pay

## 2023-12-20 MED ORDER — IRBESARTAN 300 MG PO TABS: 300.0000 mg | ORAL_TABLET | Freq: Every evening | ORAL | Status: AC

## 2023-12-20 MED ORDER — FUROSEMIDE 20 MG PO TABS
20.0000 mg | ORAL_TABLET | ORAL | Status: DC | PRN
Start: 2023-12-20 — End: 2024-01-25

## 2023-12-20 NOTE — Telephone Encounter (Signed)
 Patient was returning call. Please advise ?

## 2023-12-20 NOTE — Telephone Encounter (Signed)
 Hold Norvasc for now.  Stop Hydralazine as well.   Jardiance 10mg  po qAM Aldactone 50mg  po qAM Avapro 300mg  po qPM  Lasix 20mg  po qDay AS NEEDED - if weight goes up by 1# in 24hr or 3# in one week. Take it daily and stop when weight back to baseline.   Monitor her BP at the same time twice day and have her send [place] an average for AM and PM.   Update her med list.   Tessa Lerner, DO, [facility]

## 2023-12-20 NOTE — Telephone Encounter (Signed)
 Spoke with pt and reconciled all medications that could impact blood pressure. Pt states she takes the following medications at these times:  Amlodipine (Norvasc) 5 mg AM - has been holding since 4/4 Empagliflozin (Jardiance) 10 mg AM Furosemide (Lasix) 20 mg AM Hydralazine (Apresoline) 25 mg AM and PM Irbesartan (Avapro) 300 mg AM Spironolactone (Aldactone) 50 mg AM  Pt verified that her symptoms have not really changed since the last time we spoke.

## 2023-12-20 NOTE — Telephone Encounter (Signed)
 LVM with the information of medication changes Dr. Odis Hollingshead had recommended to make. Explained for pt to take BP readings twice daily at the same time every day (AM and PM) for 1 week and then to let us know what the average of those AM and PM readings are. Pt medication list is updated accordingly. Advised pt to call us back to let us know she understood the medication changes and plan of care going forward.

## 2023-12-20 NOTE — Addendum Note (Signed)
 Addended by: Cherylann Banas on: 12/20/2023 05:28 PM   Modules accepted: Orders

## 2023-12-21 ENCOUNTER — Encounter: Payer: Self-pay | Admitting: Hematology

## 2023-12-21 ENCOUNTER — Ambulatory Visit (INDEPENDENT_AMBULATORY_CARE_PROVIDER_SITE_OTHER)

## 2023-12-21 VITALS — BP 116/67 | HR 71 | Temp 97.7°F | Resp 20 | Ht 64.0 in | Wt 208.8 lb

## 2023-12-21 DIAGNOSIS — D509 Iron deficiency anemia, unspecified: Secondary | ICD-10-CM | POA: Diagnosis not present

## 2023-12-21 DIAGNOSIS — D508 Other iron deficiency anemias: Secondary | ICD-10-CM

## 2023-12-21 MED ORDER — ACETAMINOPHEN 325 MG PO TABS
650.0000 mg | ORAL_TABLET | Freq: Once | ORAL | Status: AC
  Administered 2023-12-21: 650 mg via ORAL
  Filled 2023-12-21: qty 2

## 2023-12-21 MED ORDER — DIPHENHYDRAMINE HCL 25 MG PO CAPS
25.0000 mg | ORAL_CAPSULE | Freq: Once | ORAL | Status: AC
  Administered 2023-12-21: 25 mg via ORAL
  Filled 2023-12-21: qty 1

## 2023-12-21 MED ORDER — SODIUM CHLORIDE 0.9 % IV SOLN
510.0000 mg | Freq: Once | INTRAVENOUS | Status: AC
  Administered 2023-12-21: 510 mg via INTRAVENOUS
  Filled 2023-12-21: qty 17

## 2023-12-21 NOTE — Progress Notes (Signed)
 Diagnosis: Iron Deficiency Anemia  Provider:  Chilton Greathouse MD  Procedure: IV Infusion  IV Type: Peripheral, IV Location: R Forearm  Feraheme (Ferumoxytol), Dose: 510 mg  Infusion Start Time: 1343  Infusion Stop Time: 1402  Post Infusion IV Care: Observation period completed and Peripheral IV Discontinued  Discharge: Condition: Good, Destination: Home . AVS Provided  Performed by:  Adriana Mccallum, RN

## 2023-12-26 ENCOUNTER — Other Ambulatory Visit (HOSPITAL_COMMUNITY): Payer: Self-pay

## 2023-12-26 ENCOUNTER — Other Ambulatory Visit: Payer: Self-pay

## 2023-12-26 MED ORDER — TRESIBA FLEXTOUCH 200 UNIT/ML ~~LOC~~ SOPN
80.0000 [IU] | PEN_INJECTOR | Freq: Every day | SUBCUTANEOUS | 5 refills | Status: AC
Start: 2023-12-26 — End: ?
  Filled 2023-12-26: qty 30, 75d supply, fill #0
  Filled 2024-03-05: qty 30, 75d supply, fill #1

## 2023-12-27 DIAGNOSIS — H2513 Age-related nuclear cataract, bilateral: Secondary | ICD-10-CM | POA: Diagnosis not present

## 2023-12-28 ENCOUNTER — Other Ambulatory Visit (HOSPITAL_COMMUNITY): Payer: Self-pay

## 2023-12-28 ENCOUNTER — Other Ambulatory Visit: Payer: Self-pay

## 2023-12-28 DIAGNOSIS — H16213 Exposure keratoconjunctivitis, bilateral: Secondary | ICD-10-CM | POA: Diagnosis not present

## 2023-12-28 DIAGNOSIS — C9110 Chronic lymphocytic leukemia of B-cell type not having achieved remission: Secondary | ICD-10-CM

## 2023-12-28 DIAGNOSIS — H2000 Unspecified acute and subacute iridocyclitis: Secondary | ICD-10-CM | POA: Diagnosis not present

## 2023-12-28 DIAGNOSIS — D508 Other iron deficiency anemias: Secondary | ICD-10-CM

## 2023-12-28 MED ORDER — PREDNISOLONE ACETATE 1 % OP SUSP
1.0000 [drp] | Freq: Four times a day (QID) | OPHTHALMIC | 0 refills | Status: AC
  Filled 2023-12-28: qty 5, 25d supply, fill #0

## 2023-12-28 MED ORDER — PREDNISOLONE ACETATE 1 % OP SUSP
1.0000 [drp] | Freq: Four times a day (QID) | OPHTHALMIC | 0 refills | Status: AC
  Filled 2023-12-28: qty 10, 30d supply, fill #0
  Filled 2024-01-17: qty 10, 50d supply, fill #0

## 2023-12-29 ENCOUNTER — Inpatient Hospital Stay: Payer: PPO | Attending: Hematology

## 2023-12-29 ENCOUNTER — Inpatient Hospital Stay: Payer: PPO

## 2023-12-29 VITALS — BP 115/46 | HR 68 | Temp 98.0°F | Resp 18 | Wt 209.5 lb

## 2023-12-29 DIAGNOSIS — D801 Nonfamilial hypogammaglobulinemia: Secondary | ICD-10-CM | POA: Insufficient documentation

## 2023-12-29 DIAGNOSIS — Z7982 Long term (current) use of aspirin: Secondary | ICD-10-CM | POA: Diagnosis not present

## 2023-12-29 DIAGNOSIS — Z79899 Other long term (current) drug therapy: Secondary | ICD-10-CM | POA: Diagnosis not present

## 2023-12-29 DIAGNOSIS — Z7951 Long term (current) use of inhaled steroids: Secondary | ICD-10-CM | POA: Insufficient documentation

## 2023-12-29 DIAGNOSIS — D509 Iron deficiency anemia, unspecified: Secondary | ICD-10-CM | POA: Insufficient documentation

## 2023-12-29 DIAGNOSIS — D508 Other iron deficiency anemias: Secondary | ICD-10-CM

## 2023-12-29 DIAGNOSIS — Z7969 Long term (current) use of other immunomodulators and immunosuppressants: Secondary | ICD-10-CM | POA: Insufficient documentation

## 2023-12-29 DIAGNOSIS — D696 Thrombocytopenia, unspecified: Secondary | ICD-10-CM | POA: Insufficient documentation

## 2023-12-29 DIAGNOSIS — C9110 Chronic lymphocytic leukemia of B-cell type not having achieved remission: Secondary | ICD-10-CM

## 2023-12-29 DIAGNOSIS — C8300 Small cell B-cell lymphoma, unspecified site: Secondary | ICD-10-CM

## 2023-12-29 LAB — CMP (CANCER CENTER ONLY)
ALT: 33 U/L (ref 0–44)
AST: 24 U/L (ref 15–41)
Albumin: 4.7 g/dL (ref 3.5–5.0)
Alkaline Phosphatase: 81 U/L (ref 38–126)
Anion gap: 9 (ref 5–15)
BUN: 25 mg/dL — ABNORMAL HIGH (ref 8–23)
CO2: 25 mmol/L (ref 22–32)
Calcium: 9.2 mg/dL (ref 8.9–10.3)
Chloride: 102 mmol/L (ref 98–111)
Creatinine: 1.07 mg/dL — ABNORMAL HIGH (ref 0.44–1.00)
GFR, Estimated: 55 mL/min — ABNORMAL LOW (ref >60)
Glucose, Bld: 273 mg/dL — ABNORMAL HIGH (ref 70–99)
Potassium: 4.5 mmol/L (ref 3.5–5.1)
Sodium: 136 mmol/L (ref 135–145)
Total Bilirubin: 0.6 mg/dL (ref 0.0–1.2)
Total Protein: 6.9 g/dL (ref 6.5–8.1)

## 2023-12-29 LAB — FERRITIN: Ferritin: 158 ng/mL (ref 11–307)

## 2023-12-29 LAB — CBC WITH DIFFERENTIAL (CANCER CENTER ONLY)
Abs Immature Granulocytes: 0.01 10*3/uL (ref 0.00–0.07)
Basophils Absolute: 0 10*3/uL (ref 0.0–0.1)
Basophils Relative: 1 %
Eosinophils Absolute: 0.1 10*3/uL (ref 0.0–0.5)
Eosinophils Relative: 1 %
HCT: 36.4 % (ref 36.0–46.0)
Hemoglobin: 11 g/dL — ABNORMAL LOW (ref 12.0–15.0)
Immature Granulocytes: 0 %
Lymphocytes Relative: 11 %
Lymphs Abs: 0.4 10*3/uL — ABNORMAL LOW (ref 0.7–4.0)
MCH: 24.1 pg — ABNORMAL LOW (ref 26.0–34.0)
MCHC: 30.2 g/dL (ref 30.0–36.0)
MCV: 79.6 fL — ABNORMAL LOW (ref 80.0–100.0)
Monocytes Absolute: 0.4 10*3/uL (ref 0.1–1.0)
Monocytes Relative: 10 %
Neutro Abs: 3 10*3/uL (ref 1.7–7.7)
Neutrophils Relative %: 77 %
Platelet Count: 51 10*3/uL — ABNORMAL LOW (ref 150–400)
RBC: 4.57 MIL/uL (ref 3.87–5.11)
RDW: 24 % — ABNORMAL HIGH (ref 11.5–15.5)
WBC Count: 3.9 10*3/uL — ABNORMAL LOW (ref 4.0–10.5)
nRBC: 0 % (ref 0.0–0.2)

## 2023-12-29 LAB — URIC ACID: Uric Acid, Serum: 8.4 mg/dL — ABNORMAL HIGH (ref 2.5–7.1)

## 2023-12-29 LAB — IRON AND IRON BINDING CAPACITY (CC-WL,HP ONLY)
Iron: 91 ug/dL (ref 28–170)
Saturation Ratios: 23 % (ref 10.4–31.8)
TIBC: 402 ug/dL (ref 250–450)
UIBC: 311 ug/dL (ref 148–442)

## 2023-12-29 LAB — LACTATE DEHYDROGENASE: LDH: 129 U/L (ref 98–192)

## 2023-12-29 MED ORDER — DIPHENHYDRAMINE HCL 25 MG PO CAPS
25.0000 mg | ORAL_CAPSULE | Freq: Once | ORAL | Status: AC
  Administered 2023-12-29: 25 mg via ORAL
  Filled 2023-12-29: qty 1

## 2023-12-29 MED ORDER — ONDANSETRON HCL 4 MG/2ML IJ SOLN
4.0000 mg | Freq: Once | INTRAMUSCULAR | Status: AC
  Administered 2023-12-29: 4 mg via INTRAVENOUS
  Filled 2023-12-29: qty 2

## 2023-12-29 MED ORDER — METHYLPREDNISOLONE SODIUM SUCC 40 MG IJ SOLR
40.0000 mg | Freq: Every day | INTRAMUSCULAR | Status: DC
Start: 2023-12-29 — End: 2023-12-29
  Administered 2023-12-29: 40 mg via INTRAVENOUS
  Filled 2023-12-29: qty 1

## 2023-12-29 MED ORDER — DEXTROSE 5 % IV SOLN: INTRAVENOUS | Status: DC

## 2023-12-29 MED ORDER — IMMUNE GLOBULIN (HUMAN) 10 GM/100ML IV SOLN
400.0000 mg/kg | INTRAVENOUS | Status: DC
  Administered 2023-12-29: 40 g via INTRAVENOUS
  Filled 2023-12-29: qty 400

## 2023-12-29 MED ORDER — ACETAMINOPHEN 325 MG PO TABS
650.0000 mg | ORAL_TABLET | Freq: Once | ORAL | Status: AC
  Administered 2023-12-29: 650 mg via ORAL
  Filled 2023-12-29: qty 2

## 2023-12-29 NOTE — Patient Instructions (Signed)
 Immune Globulin Injection What is this medication? IMMUNE GLOBULIN (im MUNE GLOB yoo lin) treats many immune system conditions. It works by Designer, multimedia extra antibodies. Antibodies are proteins made by the immune system that help protect the body. This medicine may be used for other purposes; ask your health care provider or pharmacist if you have questions. COMMON BRAND NAME(S): ASCENIV, Baygam, BIVIGAM, Carimune, Carimune NF, cutaquig, Cuvitru, Flebogamma, Flebogamma DIF, GamaSTAN, GamaSTAN S/D, Gamimune N, Gammagard, Gammagard S/D, Gammaked, Gammaplex, Gammar-P IV, Gamunex, Gamunex-C, Hizentra, Iveegam, Iveegam EN, Octagam, Panglobulin, Panglobulin NF, panzyga, Polygam S/D, Privigen, Sandoglobulin, Venoglobulin-S, Vigam, Vivaglobulin, Xembify What should I tell my care team before I take this medication? They need to know if you have any of these conditions: Blood clotting disorder Condition where you have excess fluid in your body, such as heart failure or edema Dehydration Diabetes Have had blood clots Heart disease Immune system conditions Kidney disease Low levels of IgA Recent or upcoming vaccine An unusual or allergic reaction to immune globulin, other medications, foods, dyes, or preservatives Pregnant or trying to get pregnant Breastfeeding How should I use this medication? This medication is infused into a vein or under the skin. It is usually given by your care team in a hospital or clinic setting. It may also be given at home. If you get this medication at home, you will be taught how to prepare and give it. Use exactly as directed. Take it as directed on the prescription label at the same time every day. Keep taking it unless your care team tells you to stop. It is important that you put your used needles and syringes in a special sharps container. Do not put them in a trash can. If you do not have a sharps container, call your pharmacist or care team to get one. Talk to  your care team about the use of this medication in children. While it may be given to children for selected conditions, precautions do apply. Overdosage: If you think you have taken too much of this medicine contact a poison control center or emergency room at once. NOTE: This medicine is only for you. Do not share this medicine with others. What if I miss a dose? If you get this medication at the hospital or clinic: It is important not to miss your dose. Call your care team if you are unable to keep an appointment. If you give yourself this medication at home: If you miss a dose, take it as soon as you can. Then continue your normal schedule. If it is almost time for your next dose, take only that dose. Do not take double or extra doses. Call your care team with questions. What may interact with this medication? Live virus vaccines This list may not describe all possible interactions. Give your health care provider a list of all the medicines, herbs, non-prescription drugs, or dietary supplements you use. Also tell them if you smoke, drink alcohol, or use illegal drugs. Some items may interact with your medicine. What should I watch for while using this medication? Your condition will be monitored carefully while you are receiving this medication. Tell your care team if your symptoms do not start to get better or if they get worse. You may need blood work done while you are taking this medication. This medication increases the risk of blood clots. People with heart, blood vessel, or blood clotting conditions are more likely to develop a blood clot. Other risk factors include advanced age, estrogen  use, tobacco use, lack of movement, and being overweight. This medication can decrease the response to a vaccine. If you need to get vaccinated, tell your care team if you have received this medication within the last year. Extra booster doses may be needed. Talk to your care team to see if a different  vaccination schedule is needed. If you have diabetes, you may get a falsely elevated blood sugar reading. Talk to your care team about how to check your blood sugar while taking this medication. What side effects may I notice from receiving this medication? Side effects that you should report to your care team as soon as possible: Allergic reactions--skin rash, itching, hives, swelling of the face, lips, tongue, or throat Blood clot--pain, swelling, or warmth in the leg, shortness of breath, chest pain Fever, neck pain or stiffness, sensitivity to light, headache, nausea, vomiting, confusion, which may be signs of meningitis Hemolytic anemia--unusual weakness or fatigue, dizziness, headache, trouble breathing, dark urine, yellowing skin or eyes Kidney injury--decrease in the amount of urine, swelling of the ankles, hands, or feet Low sodium level--muscle weakness, fatigue, dizziness, headache, confusion Shortness of breath or trouble breathing, cough, unusual weakness or fatigue, blue skin or lips Side effects that usually do not require medical attention (report these to your care team if they continue or are bothersome): Chills Diarrhea Fever Headache Nausea This list may not describe all possible side effects. Call your doctor for medical advice about side effects. You may report side effects to FDA at 1-800-FDA-1088. Where should I keep my medication? Keep out of the reach of children and pets. You will be instructed on how to store this medication. Get rid of any unused medication after the expiration date. To get rid of medications that are no longer needed or have expired: Take the medication to a medication take-back program. Check with your pharmacy or law enforcement to find a location. If you cannot return the medication, ask your pharmacist or care team how to get rid of this medication safely. NOTE: This sheet is a summary. It may not cover all possible information. If you have  questions about this medicine, talk to your doctor, pharmacist, or health care provider.  2024 Elsevier/Gold Standard (2023-08-11 00:00:00)

## 2023-12-30 ENCOUNTER — Other Ambulatory Visit (HOSPITAL_COMMUNITY): Payer: Self-pay

## 2024-01-01 ENCOUNTER — Ambulatory Visit: Attending: Cardiology | Admitting: Cardiology

## 2024-01-01 ENCOUNTER — Encounter: Payer: Self-pay | Admitting: Cardiology

## 2024-01-01 VITALS — BP 120/60 | HR 62 | Resp 16 | Ht 64.0 in | Wt 212.0 lb

## 2024-01-01 DIAGNOSIS — I6523 Occlusion and stenosis of bilateral carotid arteries: Secondary | ICD-10-CM

## 2024-01-01 DIAGNOSIS — I10 Essential (primary) hypertension: Secondary | ICD-10-CM | POA: Diagnosis not present

## 2024-01-01 DIAGNOSIS — I5189 Other ill-defined heart diseases: Secondary | ICD-10-CM

## 2024-01-01 DIAGNOSIS — G4730 Sleep apnea, unspecified: Secondary | ICD-10-CM | POA: Diagnosis not present

## 2024-01-01 DIAGNOSIS — R42 Dizziness and giddiness: Secondary | ICD-10-CM

## 2024-01-01 NOTE — Patient Instructions (Signed)
 Medication Instructions:  Your physician has recommended you make the following change in your medication:   AS NEEDED- Only take the Furosemide  (Lasix ) as needed   *If you need a refill on your cardiac medications before your next appointment, please call your pharmacy*  Lab Work: None ordered today. If you have labs (blood work) drawn today and your tests are completely normal, you will receive your results only by: MyChart Message (if you have MyChart) OR A paper copy in the mail If you have any lab test that is abnormal or we need to change your treatment, we will call you to review the results.  Testing/Procedures: Your physician has requested that we push your scheduled carotid duplex to October 2025.  Follow-Up: At [facility], you and your health needs are our priority.  As part of our continuing mission to provide you with exceptional heart care, we have created designated Provider Care Teams.  These Care Teams include your primary Cardiologist (physician) and Advanced Practice Providers (APPs -  Physician Assistants and Nurse Practitioners) who all work together to provide you with the care you need, when you need it.  Your next appointment:   November 2025  The format for your next appointment:   In Person  Provider:   Olinda Bertrand, [facility]  Other Instructions    1st Floor: - Lobby - Registration  - Pharmacy  - Lab - Cafe  2nd Floor: - PV Lab - Diagnostic Testing (echo, CT, nuclear med)  3rd Floor: - Vacant  4th Floor: - TCTS (cardiothoracic surgery) - AFib Clinic - Structural Heart Clinic - Vascular Surgery  - Vascular Ultrasound  5th Floor: - HeartCare Cardiology (general and EP) - Clinical Pharmacy for coumadin, hypertension, lipid, weight-loss medications, and med management appointments    Valet parking services will be available as well.

## 2024-01-01 NOTE — Progress Notes (Signed)
 Cardiology Office Note:  .   Date:  01/01/2024  ID:  Janice [name], DOB December 04, 1950, MRN [id number] PCP:  Lonzie Robins, MD  Former Cardiology Providers: Dr. Fransico Ivy, [facility] Stall, PA   HeartCare Providers Cardiologist:  Olinda Bertrand, DO , [facility] (established care  04/06/23) Electrophysiologist:  None  Click to update primary MD,subspecialty MD or APP then REFRESH:1}    Chief Complaint  Patient presents with   Dizziness   Follow-up    History of Present Illness: .   Janice [name] is a 73 y.o. Caucasian female whose past medical history and cardiovascular risk factors includes: Hypertension, diabetes mellitus type 2, aortic regurgitation, carotid stenosis, sleep apnea in adult, liver cirrhosis, chronic lymphocytic lymphoma, anemia, postmenopausal female.   Patient being followed by the practice given her underlying carotid disease.  She was last seen in the office in July 2024 and presents today for follow-up for sick visit due to BP readings and feeling dizzy.   She called our office earlier this month in April 2025 due to concerns for soft blood pressures and becoming symptomatic when bending forward.  He was advised to hold amlodipine .  Due to continued symptoms of hypotension recommended holding hydralazine  25 mg p.o. twice daily.  She was advised to take Jardiance  and spironolactone  in the morning and Avapro  at night.  Continues Lasix  on as needed basis.  Prior to the meds changes w/ Norvasc  and Hydralazine  patient states she had fallen three times over the past three months, with incidents occurring both indoors and outdoors. The first fall was due to slipping on a bedspread, the second occurred while attempting to sit down, and the third happened outside, requiring assistance to get up. No syncope or prodromal symptoms.   She experiences persistent dizziness and lightheadedness despite medication adjustments. But symptoms have improved in frequency and  duration.  Based on electronic medical records she was taking Lasix  on appearing basis; however, patient informs me she takes it 6 days a week.    She monitors her blood pressure at home, with morning averages of 113/57 mmHg and evening averages of 120/61 mmHg. She occasionally checks her blood sugar levels when feeling lightheaded, and her Dexcom alerts her if levels are too low (this has not occurred).   Review of Systems: .   Review of Systems  Cardiovascular:  Negative for chest pain, claudication, irregular heartbeat, leg swelling, near-syncope, orthopnea, palpitations, paroxysmal nocturnal dyspnea and syncope.  Respiratory:  Negative for shortness of breath.   Hematologic/Lymphatic: Negative for bleeding problem.  Neurological:  Positive for dizziness and light-headedness.    Studies Reviewed:   EKG: EKG Interpretation Date/Time:  Monday January 01 2024 12:08:09 EDT Ventricular Rate:  63 PR Interval:  154 QRS Duration:  98 QT Interval:  380 QTC Calculation: 388 R Axis:   52  Text Interpretation: Normal sinus rhythm Normal ECG When compared with ECG of 03-Oct-2022 13:52, No significant change was found Confirmed by Olinda Bertrand [phone]) on 01/01/2024 12:17:38 PM  Echocardiogram: 11/08/2021: Normal LV systolic function with visual EF 60-65%. Left ventricle cavity is normal in size. No obvious regional wall motion abnormalities. Normal left ventricular wall thickness. Normal global wall motion. Doppler evidence of grade II (pseudonormal) diastolic dysfunction, elevated LAP. Mild (Grade I) aortic regurgitation. Mild (Grade I) mitral regurgitation. Mild tricuspid regurgitation. Mild pulmonary hypertension. RVSP measures 41 mmHg. Compared to study 02/03/2021 no significant change.   03/28/2023: Left ventricle cavity is normal in size. Mild concentric hypertrophy of the  left ventricle. Normal global wall motion. Normal LV systolic function with EF 65%. Doppler evidence of grade I  (impaired) diastolic dysfunction, normal LAP.  Left atrial cavity is moderately dilated. Structurally normal trileaflet aortic valve. Mild (Grade I) aortic regurgitation. Mild mitral valve leaflet thickening with mild calcification. Trace mitral regurgitation. Mild tricuspid regurgitation.  No evidence of pulmonary hypertension.   Stress Testing: Lexiscan  Tetrofosmin  stress test 11/08/2021: Low risk study.  Carotid duplex: 02/01/2022:  Duplex suggests stenosis in the right internal carotid artery (50-69%).  Duplex suggests stenosis in the right external carotid artery (<50%).  Duplex suggests stenosis in the left internal carotid artery (50-69%).  Duplex suggests stenosis in the left external carotid artery (<50%).  Compared to 02/03/2021, there is progression of stenosis severity in bilateral carotid arteries.    03/28/2023:  Duplex suggests stenosis in the right internal carotid artery (minimal).  <50% stenosis in the right external carotid artery.  Duplex suggests stenosis in the left internal carotid artery (16-49%). <50% stenosis in the left external carotid artery.  Antegrade right vertebral artery flow. Antegrade left vertebral artery flow.  Immediate comparison to study 02/01/2022 not performed due to significant velocity variation however compared to 02/03/2021, no significant change.  Follow up in one year is appropriate if clinically indicated.   RADIOLOGY: NA  Risk Assessment/Calculations:   NA   Labs:       Latest Ref Rng & Units 12/29/2023   12:21 PM 11/29/2023   10:29 AM 10/30/2023    1:33 PM  CBC  WBC 4.0 - 10.5 K/uL 3.9  4.8  4.6   Hemoglobin 12.0 - 15.0 g/dL 16.1  9.6  9.5   Hematocrit 36.0 - 46.0 % 36.4  31.9  32.1   Platelets 150 - 400 K/uL 51  76  63        Latest Ref Rng & Units 12/29/2023   12:21 PM 11/29/2023   10:29 AM 10/30/2023    1:33 PM  BMP  Glucose 70 - 99 mg/dL 096  045  409   BUN 8 - 23 mg/dL 25  34  25   Creatinine 0.44 - 1.00 mg/dL 8.11   9.14  7.82   Sodium 135 - 145 mmol/L 136  131  137   Potassium 3.5 - 5.1 mmol/L 4.5  5.0  4.4   Chloride 98 - 111 mmol/L 102  102  106   CO2 22 - 32 mmol/L 25  23  24    Calcium  8.9 - 10.3 mg/dL 9.2  9.2  9.1       Latest Ref Rng & Units 12/29/2023   12:21 PM 11/29/2023   10:29 AM 10/30/2023    1:33 PM  CMP  Glucose 70 - 99 mg/dL 956  213  086   BUN 8 - 23 mg/dL 25  34  25   Creatinine 0.44 - 1.00 mg/dL 5.78  4.69  6.29   Sodium 135 - 145 mmol/L 136  131  137   Potassium 3.5 - 5.1 mmol/L 4.5  5.0  4.4   Chloride 98 - 111 mmol/L 102  102  106   CO2 22 - 32 mmol/L 25  23  24    Calcium  8.9 - 10.3 mg/dL 9.2  9.2  9.1   Total Protein 6.5 - 8.1 g/dL 6.9  6.8  6.4   Total Bilirubin 0.0 - 1.2 mg/dL 0.6  0.5  0.5   Alkaline Phos 38 - 126 U/L 81  93  98  AST 15 - 41 U/L 24  19  19    ALT 0 - 44 U/L 33  22  24     Lab Results  Component Value Date   CHOL 125 10/18/2021   HDL 43 10/18/2021   LDLCALC 65 10/18/2021   TRIG 86 10/18/2021   No results for input(s): "LIPOA" in the last 8760 hours. No components found for: "NTPROBNP" No results for input(s): "PROBNP" in the last 8760 hours. No results for input(s): "TSH" in the last 8760 hours.  Physical Exam:    Today's Vitals   01/01/24 1201  BP: 120/60  Pulse: 62  Resp: 16  SpO2: 98%  Weight: 212 lb (96.2 kg)  Height: 5\' 4"  (1.626 m)   Orthostatic VS for the past 72 hrs (Last 3 readings):  Orthostatic BP Patient Position BP Location Cuff Size Orthostatic Pulse  01/01/24 1218 108/48 Standing Left Arm Large 63  01/01/24 1213 118/50 Sitting Left Arm Large 63  01/01/24 1208 118/58 Supine Left Arm Large 59    Body mass index is 36.39 kg/m. Wt Readings from Last 3 Encounters:  01/01/24 212 lb (96.2 kg)  12/29/23 209 lb 8 oz (95 kg)  12/21/23 208 lb 12.8 oz (94.7 kg)    Physical Exam  Constitutional: No distress.  Age appropriate, hemodynamically stable.   Neck: No JVD present.  Cardiovascular: Normal rate, regular rhythm,  S1 normal, S2 normal, intact distal pulses and normal pulses. Exam reveals no gallop, no S3 and no S4.  No murmur heard. Pulses:      Carotid pulses are  on the right side with bruit and  on the left side with bruit. Pulmonary/Chest: Effort normal and breath sounds normal. No stridor. She has no wheezes. She has no rales.  Abdominal: Soft. Bowel sounds are normal. She exhibits no distension. There is no abdominal tenderness.  Musculoskeletal:        General: No edema.     Cervical back: Neck supple.  Neurological: She is alert and oriented to person, place, and time. She has intact cranial nerves (2-12).  Skin: Skin is warm and moist.     Impression & Recommendation(s):  Impression:   ICD-10-CM   1. Asymptomatic bilateral carotid artery stenosis  I65.23 EKG 12-Lead    2. Lightheadedness  R42     3. Essential hypertension  I10     4. Diastolic dysfunction  I51.89     5. Sleep apnea in adult  G47.30        Recommendation(s):  Asymptomatic bilateral carotid artery stenosis Chronic and stable. May 2023 noted bilateral ICA disease <70%. Repeat duplex in July 2024 noted minimal disease involving the right ICA and <50% in the left ICA. Continue antiplatelets and lipid-lowering agents. Follow-up carotid duplex in July 2025 -will move it back to October 2025 with follow-up visit in November 2025.  Lightheaded and dizziness Essential hypertension Blood pressure management have been managed by PCP in the past. Cardiology asked to weigh in as she is experiencing lightheaded and dizziness. She denies near-syncope or syncopal events. She no longer taking amlodipine  or hydralazine . Average blood pressures for morning and evening are well-controlled; however, despite acceptable readings that she still symptomatic but less in intensity, frequency, and duration. EMR she is taking Lasix  on as needed basis but patient states that she is taking it 6 days of the week. I have asked her to start  taking Lasix  only if her weight goes up by more than 1 pound a day  or 3 pounds a week until she is back down to her normal weights Orthostatic vital signs negative Continue to monitor blood pressures at home. Compression stockings recommended. Patient states that the medication she takes for her CLL could also be contributory, she will discuss with her medical hematologist and oncologist  Diastolic dysfunction Stage B, NYHA class II Echo July 2024: Preserved LVEF, grade 1 diastolic dysfunction. Up titration of GDMT has significantly improved her symptoms of dyspnea exertion.   However due to lightheaded and dizziness down titration of medical therapy for now. Monitor for now  Sleep apnea in adult Reemphasized importance of CPAP compliance.   Orders Placed:  Orders Placed This Encounter  Procedures   EKG 12-Lead     Final Medication List:   No orders of the defined types were placed in this encounter.   Medications Discontinued During This Encounter  Medication Reason   empagliflozin  (JARDIANCE ) 10 MG TABS tablet Duplicate     Current Outpatient Medications:    albuterol  (VENTOLIN  HFA) 108 (90 Base) MCG/ACT inhaler, Inhale 1-2 puffs into the lungs every 6 (six) hours as needed (Cough)., Disp: 18 g, Rfl: 0   aspirin  EC 81 MG tablet, Take 1 tablet (81 mg total) by mouth daily. Swallow whole., Disp: 90 tablet, Rfl: 3   b complex vitamins capsule, Take 1 capsule by mouth in the morning., Disp: , Rfl:    bacitracin  500 UNIT/GM ointment, Apply topically 2 (two) times daily for 10 days, Disp: 30 g, Rfl: 0   budesonide -formoterol  (SYMBICORT ) 80-4.5 MCG/ACT inhaler, Inhale 2 puffs into the lungs daily. (Patient taking differently: Inhale 2 puffs into the lungs 2 (two) times daily. Patient usually does 2 puffs in morning and 1 puff in evening.), Disp: 10.2 g, Rfl: 12   Carboxymethylcellul-Glycerin (REFRESH OPTIVE PF OP), Place 1 drop into both eyes 3 (three) times daily as needed  (dry/irritated eyes.). Non-pres, Disp: , Rfl:    cephALEXin  (KEFLEX ) 500 MG capsule, Take 2 capsules (1,000 mg total) by mouth as directed 1 hour prior to dental work., Disp: 20 capsule, Rfl: 0   Coenzyme Q10 (CO Q-10) 100 MG CAPS, Take 100 mg by mouth in the morning., Disp: , Rfl:    Continuous Glucose Sensor (FREESTYLE LIBRE 2 SENSOR) MISC, Replace every 14 days and use to check blood sugar continuously., Disp: 6 each, Rfl: 3   desoximetasone  (TOPICORT ) 0.25 % cream, Apply a small amount to skin twice a day as needed for flares, Disp: 60 g, Rfl: 0   diclofenac  Sodium (VOLTAREN  ARTHRITIS PAIN) 1 % GEL, Apply topically 4 (four) times daily to bilateral knees and ankles, Disp: 200 g, Rfl: 5   diclofenac  sodium (VOLTAREN ) 1 % GEL, Apply 2 g topically 2 (two) times daily as needed (knee pain/ foot /Triger finger)., Disp: , Rfl: 2   empagliflozin  (JARDIANCE ) 10 MG TABS tablet, Take 1 tablet (10 mg total) by mouth daily., Disp: 90 tablet, Rfl: 3   fluticasone  (FLONASE ) 50 MCG/ACT nasal spray, Place 2 sprays into both nostrils in the morning., Disp: 48 g, Rfl: 3   furosemide  (LASIX ) 20 MG tablet, Take 1 tablet (20 mg total) by mouth as needed. Take if you gain 1 lb in 24 hours, or 3 lbs in 1 week. Take it daily and stop when weight back to baseline., Disp: , Rfl:    gabapentin  (NEURONTIN ) 300 MG capsule, Take 1 capsule (300 mg total) by mouth every morning AND 2 capsules (600 mg total) at bedtime., Disp: 180  capsule, Rfl: 1   glucose blood test strip, Use to test blood sugar 3 times daily as directed, Disp: 300 each, Rfl: 3   hydrocortisone  (ANUSOL -HC) 25 MG suppository, Place 1 suppository (25 mg total) rectally daily., Disp: 30 suppository, Rfl: 0   influenza vaccine adjuvanted (FLUAD) 0.5 ML injection, Inject into the muscle., Disp: 0.5 mL, Rfl: 0   insulin  aspart (NOVOLOG  FLEXPEN) 100 UNIT/ML FlexPen, Inject 30 Units into the skin daily with breakfast AND 30 Units daily with lunch AND 30 Units daily with  supper., Disp: 90 mL, Rfl: 3   insulin  aspart (NOVOLOG  FLEXPEN) 100 UNIT/ML FlexPen, Inject 30 Units into the skin daily with breakfast AND 30 Units daily with lunch AND 30 Units daily with supper., Disp: 45 mL, Rfl: 3   insulin  degludec (TRESIBA  FLEXTOUCH) 200 UNIT/ML FlexTouch Pen, Inject 80 Units into the skin daily., Disp: 30 mL, Rfl: 5   Insulin  Pen Needle 32G X 4 MM MISC, Use as directed 5 (five) times daily., Disp: 400 each, Rfl: 3   irbesartan  (AVAPRO ) 300 MG tablet, Take 1 tablet (300 mg total) by mouth every evening., Disp: , Rfl:    Lancets (ONETOUCH DELICA PLUS LANCET33G) MISC, Use to test blood sugar 3 times daily, Disp: 300 each, Rfl: 2   metFORMIN  (GLUCOPHAGE -XR) 500 MG 24 hr tablet, Take 2 tablets (1,000 mg total) by mouth 2 (two) times daily., Disp: 360 tablet, Rfl: 3   omeprazole  (PRILOSEC) 40 MG capsule, Take 1 capsule (40 mg total) by mouth 2 (two) times daily., Disp: 180 capsule, Rfl: 3   ondansetron  (ZOFRAN -ODT) 4 MG disintegrating tablet, Dissolve 1 tablet (4 mg) by mouth every 8 hours as needed for nausea or vomiting., Disp: 20 tablet, Rfl: 0   prednisoLONE  acetate (PRED FORTE ) 1 % ophthalmic suspension, Place 1 drop into both eyes 4 (four) times daily., Disp: 10 mL, Rfl: 0   prednisoLONE  acetate (PREDNISOLONE  ACETATE P-F) 1 % ophthalmic suspension, Place 1 drop into both eyes 4 (four) times daily., Disp: 5 mL, Rfl: 0   rosuvastatin  (CRESTOR ) 10 MG tablet, Take 1 tablet (10 mg total) by mouth daily., Disp: 90 tablet, Rfl: 3   sertraline  (ZOLOFT ) 50 MG tablet, Take 1 tablet (50 mg total) by mouth daily. (Patient taking differently: Take 50 mg by mouth every evening.), Disp: 90 tablet, Rfl: 3   spironolactone  (ALDACTONE ) 50 MG tablet, Take 1 tablet (50 mg total) by mouth daily., Disp: 90 tablet, Rfl: 3   terconazole (TERAZOL 3) 0.8 % vaginal cream, Place 1 applicator vaginally daily as needed (yeast infections)., Disp: , Rfl:    venetoclax  (VENCLEXTA ) 100 MG tablet, Take 1 tablet  (100 mg total) by mouth daily. Tablets should be swallowed whole with a meal and a full glass of water ., Disp: 30 tablet, Rfl: 3   Zoster Vaccine Adjuvanted (SHINGRIX ) injection, Inject into the muscle., Disp: 1 each, Rfl: 1   amLODipine  (NORVASC ) 10 MG tablet, Take 0.5 tablets (5 mg total) by mouth daily., Disp: 45 tablet, Rfl: 3   amLODipine  (NORVASC ) 5 MG tablet, Take 1 tablet (5 mg total) by mouth daily. (Patient not taking: Reported on 01/01/2024), Disp: 90 tablet, Rfl: 3   azelastine  (ASTELIN ) 0.1 % nasal spray, Place 1 spray into both nostrils 2 (two) times daily. Use in each nostril as directed, Disp: 9 mL, Rfl: 11   Continuous Glucose Sensor (FREESTYLE LIBRE 2 SENSOR) MISC, Replace every 14 days and use to check blood sugar continuously., Disp: 2 each, Rfl: 3  hydrALAZINE  (APRESOLINE ) 25 MG tablet, Take 1 tablet (25 mg total) by mouth 2 (two) times daily. (Patient not taking: Reported on 01/01/2024), Disp: 180 tablet, Rfl: 2  Consent:   NA  Disposition:   November 2025  Her questions and concerns were addressed to her satisfaction. She voices understanding of the recommendations provided during this encounter.    Signed, Olinda Bertrand, DO, [facility] [place]  [facility]  [address] [place], [place] [address] 01/01/2024 1:08 PM

## 2024-01-05 DIAGNOSIS — H16213 Exposure keratoconjunctivitis, bilateral: Secondary | ICD-10-CM | POA: Diagnosis not present

## 2024-01-05 DIAGNOSIS — H2000 Unspecified acute and subacute iridocyclitis: Secondary | ICD-10-CM | POA: Diagnosis not present

## 2024-01-08 ENCOUNTER — Other Ambulatory Visit (HOSPITAL_COMMUNITY): Payer: Self-pay

## 2024-01-09 ENCOUNTER — Other Ambulatory Visit (HOSPITAL_COMMUNITY): Payer: Self-pay

## 2024-01-10 ENCOUNTER — Other Ambulatory Visit: Payer: Self-pay

## 2024-01-10 ENCOUNTER — Other Ambulatory Visit: Payer: Self-pay | Admitting: Hematology

## 2024-01-10 ENCOUNTER — Other Ambulatory Visit (HOSPITAL_COMMUNITY): Payer: Self-pay

## 2024-01-10 DIAGNOSIS — C9110 Chronic lymphocytic leukemia of B-cell type not having achieved remission: Secondary | ICD-10-CM

## 2024-01-10 MED ORDER — VENETOCLAX 100 MG PO TABS
100.0000 mg | ORAL_TABLET | Freq: Every day | ORAL | 3 refills | Status: DC
Start: 2024-01-10 — End: 2024-04-25
  Filled 2024-01-10 (×2): qty 28, 28d supply, fill #0
  Filled 2024-02-07: qty 28, 28d supply, fill #1
  Filled 2024-03-07 (×2): qty 28, 28d supply, fill #2
  Filled 2024-04-04 – 2024-04-09 (×3): qty 28, 28d supply, fill #3

## 2024-01-10 NOTE — Progress Notes (Signed)
 Specialty Pharmacy Refill Coordination Note  Janice [name] is a 73 y.o. female contacted today regarding refills of specialty medication(s) Venetoclax  (VENCLEXTA )   Patient requested (Patient-Rptd) Delivery   Delivery date: (Patient-Rptd) 01/08/24   Verified address: (Patient-Rptd) [address], [place] [address]   Medication will be filled when refill is approved. Left voicemail for patient regarding updated delivery time line and pending refill request.

## 2024-01-10 NOTE — Progress Notes (Signed)
 Patient called back and is okay to receive on 5.5.25 or 5.6.25

## 2024-01-11 ENCOUNTER — Other Ambulatory Visit (HOSPITAL_COMMUNITY): Payer: Self-pay

## 2024-01-11 DIAGNOSIS — H2512 Age-related nuclear cataract, left eye: Secondary | ICD-10-CM | POA: Diagnosis not present

## 2024-01-12 ENCOUNTER — Other Ambulatory Visit: Payer: Self-pay

## 2024-01-12 ENCOUNTER — Other Ambulatory Visit (HOSPITAL_COMMUNITY): Payer: Self-pay

## 2024-01-13 ENCOUNTER — Other Ambulatory Visit: Payer: Self-pay | Admitting: Cardiology

## 2024-01-15 ENCOUNTER — Other Ambulatory Visit: Payer: Self-pay

## 2024-01-15 ENCOUNTER — Other Ambulatory Visit: Payer: Self-pay | Admitting: Internal Medicine

## 2024-01-15 ENCOUNTER — Other Ambulatory Visit: Payer: Self-pay | Admitting: *Deleted

## 2024-01-15 ENCOUNTER — Other Ambulatory Visit (HOSPITAL_COMMUNITY): Payer: Self-pay

## 2024-01-15 DIAGNOSIS — C8300 Small cell B-cell lymphoma, unspecified site: Secondary | ICD-10-CM

## 2024-01-15 DIAGNOSIS — D801 Nonfamilial hypogammaglobulinemia: Secondary | ICD-10-CM

## 2024-01-15 MED ORDER — GABAPENTIN 300 MG PO CAPS
600.0000 mg | ORAL_CAPSULE | Freq: Every day | ORAL | 3 refills | Status: AC
  Filled 2024-01-15: qty 180, 90d supply, fill #0
  Filled 2024-04-12: qty 180, 90d supply, fill #1
  Filled 2024-07-13: qty 180, 90d supply, fill #2
  Filled 2024-10-08: qty 180, 90d supply, fill #3

## 2024-01-16 ENCOUNTER — Other Ambulatory Visit (HOSPITAL_COMMUNITY): Payer: Self-pay

## 2024-01-16 ENCOUNTER — Inpatient Hospital Stay: Attending: Hematology

## 2024-01-16 ENCOUNTER — Inpatient Hospital Stay (HOSPITAL_BASED_OUTPATIENT_CLINIC_OR_DEPARTMENT_OTHER): Admitting: Hematology

## 2024-01-16 VITALS — BP 120/57 | HR 68 | Temp 98.6°F | Resp 17 | Wt 211.9 lb

## 2024-01-16 DIAGNOSIS — D801 Nonfamilial hypogammaglobulinemia: Secondary | ICD-10-CM

## 2024-01-16 DIAGNOSIS — K7460 Unspecified cirrhosis of liver: Secondary | ICD-10-CM | POA: Insufficient documentation

## 2024-01-16 DIAGNOSIS — Z7969 Long term (current) use of other immunomodulators and immunosuppressants: Secondary | ICD-10-CM | POA: Diagnosis not present

## 2024-01-16 DIAGNOSIS — R162 Hepatomegaly with splenomegaly, not elsewhere classified: Secondary | ICD-10-CM | POA: Insufficient documentation

## 2024-01-16 DIAGNOSIS — Z7951 Long term (current) use of inhaled steroids: Secondary | ICD-10-CM | POA: Insufficient documentation

## 2024-01-16 DIAGNOSIS — C9110 Chronic lymphocytic leukemia of B-cell type not having achieved remission: Secondary | ICD-10-CM | POA: Diagnosis present

## 2024-01-16 DIAGNOSIS — D696 Thrombocytopenia, unspecified: Secondary | ICD-10-CM | POA: Insufficient documentation

## 2024-01-16 DIAGNOSIS — Z79899 Other long term (current) drug therapy: Secondary | ICD-10-CM | POA: Insufficient documentation

## 2024-01-16 DIAGNOSIS — R188 Other ascites: Secondary | ICD-10-CM | POA: Diagnosis not present

## 2024-01-16 DIAGNOSIS — Z7982 Long term (current) use of aspirin: Secondary | ICD-10-CM | POA: Diagnosis not present

## 2024-01-16 DIAGNOSIS — D509 Iron deficiency anemia, unspecified: Secondary | ICD-10-CM | POA: Diagnosis not present

## 2024-01-16 DIAGNOSIS — C8300 Small cell B-cell lymphoma, unspecified site: Secondary | ICD-10-CM

## 2024-01-16 DIAGNOSIS — R590 Localized enlarged lymph nodes: Secondary | ICD-10-CM | POA: Insufficient documentation

## 2024-01-16 LAB — CMP (CANCER CENTER ONLY)
ALT: 25 U/L (ref 0–44)
AST: 20 U/L (ref 15–41)
Albumin: 4.5 g/dL (ref 3.5–5.0)
Alkaline Phosphatase: 81 U/L (ref 38–126)
Anion gap: 7 (ref 5–15)
BUN: 31 mg/dL — ABNORMAL HIGH (ref 8–23)
CO2: 26 mmol/L (ref 22–32)
Calcium: 9 mg/dL (ref 8.9–10.3)
Chloride: 103 mmol/L (ref 98–111)
Creatinine: 1.11 mg/dL — ABNORMAL HIGH (ref 0.44–1.00)
GFR, Estimated: 53 mL/min — ABNORMAL LOW (ref >60)
Glucose, Bld: 146 mg/dL — ABNORMAL HIGH (ref 70–99)
Potassium: 5.3 mmol/L — ABNORMAL HIGH (ref 3.5–5.1)
Sodium: 136 mmol/L (ref 135–145)
Total Bilirubin: 0.6 mg/dL (ref 0.0–1.2)
Total Protein: 6.9 g/dL (ref 6.5–8.1)

## 2024-01-16 LAB — CBC WITH DIFFERENTIAL (CANCER CENTER ONLY)
Abs Immature Granulocytes: 0.01 10*3/uL (ref 0.00–0.07)
Basophils Absolute: 0 10*3/uL (ref 0.0–0.1)
Basophils Relative: 1 %
Eosinophils Absolute: 0.1 10*3/uL (ref 0.0–0.5)
Eosinophils Relative: 1 %
HCT: 37.1 % (ref 36.0–46.0)
Hemoglobin: 11.9 g/dL — ABNORMAL LOW (ref 12.0–15.0)
Immature Granulocytes: 0 %
Lymphocytes Relative: 14 %
Lymphs Abs: 0.7 10*3/uL (ref 0.7–4.0)
MCH: 26 pg (ref 26.0–34.0)
MCHC: 32.1 g/dL (ref 30.0–36.0)
MCV: 81 fL (ref 80.0–100.0)
Monocytes Absolute: 0.5 10*3/uL (ref 0.1–1.0)
Monocytes Relative: 10 %
Neutro Abs: 3.3 10*3/uL (ref 1.7–7.7)
Neutrophils Relative %: 74 %
Platelet Count: 54 10*3/uL — ABNORMAL LOW (ref 150–400)
RBC: 4.58 MIL/uL (ref 3.87–5.11)
RDW: 23.3 % — ABNORMAL HIGH (ref 11.5–15.5)
WBC Count: 4.5 10*3/uL (ref 4.0–10.5)
nRBC: 0 % (ref 0.0–0.2)

## 2024-01-16 LAB — FERRITIN: Ferritin: 56 ng/mL (ref 11–307)

## 2024-01-16 LAB — IRON AND IRON BINDING CAPACITY (CC-WL,HP ONLY)
Iron: 80 ug/dL (ref 28–170)
Saturation Ratios: 18 % (ref 10.4–31.8)
TIBC: 435 ug/dL (ref 250–450)
UIBC: 355 ug/dL (ref 148–442)

## 2024-01-16 LAB — URIC ACID: Uric Acid, Serum: 7.5 mg/dL — ABNORMAL HIGH (ref 2.5–7.1)

## 2024-01-16 LAB — LACTATE DEHYDROGENASE: LDH: 139 U/L (ref 98–192)

## 2024-01-17 ENCOUNTER — Other Ambulatory Visit: Payer: Self-pay

## 2024-01-17 ENCOUNTER — Other Ambulatory Visit (HOSPITAL_COMMUNITY): Payer: Self-pay

## 2024-01-17 MED ORDER — IRBESARTAN 300 MG PO TABS
300.0000 mg | ORAL_TABLET | Freq: Every morning | ORAL | 3 refills | Status: DC
Start: 2024-01-17 — End: 2024-06-24
  Filled 2024-01-17: qty 90, 90d supply, fill #0

## 2024-01-18 ENCOUNTER — Other Ambulatory Visit (HOSPITAL_COMMUNITY): Payer: Self-pay

## 2024-01-18 ENCOUNTER — Other Ambulatory Visit: Payer: Self-pay

## 2024-01-19 DIAGNOSIS — G4733 Obstructive sleep apnea (adult) (pediatric): Secondary | ICD-10-CM | POA: Diagnosis not present

## 2024-01-19 NOTE — Progress Notes (Signed)
 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  INTERVAL HISTORY:  Janice [name] is a 73 y.o. 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  Patient was last seen by me on 11/29/2023 and reported generalized skin rash, painful mouth sores, dizziness, and lightheadedness.  Patient notes rash is completely resolved and still have oral ulcers.  No fevers no chills no night sweats.  No significant new lumps or bumps.  No unexpected weight loss. Patient is tolerating her current dose of venetoclax  without significant issues.  MEDICAL HISTORY:  Past Medical History:  Diagnosis Date   Allergy    Asthma    seasonal   Cellulitis 2013   Left toe   Chronic kidney disease    stage 1   Cirrhosis (HCC)    Common migraine    History of   Complication of anesthesia    Per pt/had breathing problems with "block" during rotator cuff surgery. 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   Coronary artery disease    DDD (degenerative disc disease)    Depression    denies takes paxil for migraines   Diabetes mellitus    type 2   Diabetic peripheral neuropathy (HCC)    Difficult airway for intubation 10/20/2022   DLx 1 with MAC 4, unable to see cords, DL with lopro S3 glidescope, easy atraumatic intubation   Difficult intubation 03/03/2023   Diverticulitis    Diverticulosis    DJD (degenerative joint disease)    Esophageal varices (HCC)    GERD (gastroesophageal reflux  disease)    History of colon polyps    hyperplastic   History of gastric polyp    Hyperlipidemia    Hypertension    Iron  deficiency anemia    Obesity    OSA on CPAP    cpap   Peripheral neuropathy    Pneumonia    april 2020  mild   Primary localized osteoarthritis of left knee 03/27/2019   Sensorineural hearing loss    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HCC)     SURGICAL HISTORY: Past Surgical History:  Procedure Laterality Date   ANKLE SURGERY     Left    BREAST BIOPSY Left 2021   left axilla, lymphoma   COLONOSCOPY  05/2018   ESOPHAGOGASTRODUODENOSCOPY (EGD) WITH PROPOFOL  N/A 03/09/2023   Procedure: ESOPHAGOGASTRODUODENOSCOPY (EGD) WITH PROPOFOL ;  Surgeon: Kenney Peacemaker, MD;  Location: [facility];  Service: Gastroenterology;  Laterality: N/A;   LEEP N/A 09/14/2018   Procedure: LOOP ELECTROSURGICAL EXCISION PROCEDURE (LEEP);  Surgeon: Merryl Abraham, MD;  Location: [facility];  Service: Gynecology;  Laterality: N/A;   MASS EXCISION Right 06/24/2021   Procedure: EXCISION RIGHT POSTERIOR NECK MASS;  Surgeon: Kinsinger, Alphonso Aschoff, MD;  Location: [facility];  Service: General;  Laterality: Right;   ROTATOR CUFF REPAIR Bilateral 2012, 2015   TONSILLECTOMY     TOTAL KNEE ARTHROPLASTY Left 04/08/2019   Procedure: TOTAL KNEE ARTHROPLASTY;  Surgeon: Elly Habermann, MD;  Location: [facility];  Service: Orthopedics;  Laterality: Left;   UMBILICAL HERNIA REPAIR N/A 10/20/2022   Procedure:  OPEN HERNIA REPAIR UMBILICAL ADULT with Mesh;  Surgeon: Kinsinger, Alphonso Aschoff, MD;  Location: [facility];  Service: General;  Laterality: N/A;   UPPER GI ENDOSCOPY  05/2018    SOCIAL HISTORY: Social History   Socioeconomic History   Marital status: Widowed    Spouse name: Not on file   Number of children: 0   Years of education: 3 years college   Highest education level: Not on file  Occupational History   Occupation: Unemployed  Tobacco Use   Smoking status: Never    Passive exposure: Past    Smokeless tobacco: Never  Vaping Use   Vaping status: Never Used  Substance and Sexual Activity   Alcohol  use: Not Currently    Comment: very rare   Drug use: No   Sexual activity: Not Currently    Birth control/protection: None, Post-menopausal  Other Topics Concern   Not on file  Social History Narrative   1 caffeine drink daily    Right-handed   widow (husband died from [place] cirrhosis)   Social Drivers of Health   Financial Resource Strain: Medium Risk (08/20/2020)   Overall Financial Resource Strain (CARDIA)    Difficulty of Paying Living Expenses: Somewhat hard  Food Insecurity: No Food Insecurity (10/20/2022)   Hunger Vital Sign    Worried About Running Out of Food in the Last Year: Never true    Ran Out of Food in the Last Year: Never true  Transportation Needs: No Transportation Needs (10/20/2022)   PRAPARE - Administrator, Civil Service (Medical): No    Lack of Transportation (Non-Medical): No  Physical Activity: Not on file  Stress: Not on file  Social Connections: Unknown (01/25/2022)   [facility] from [facility], [facility]   Social Network    Social Network: Not on file  Intimate Partner Violence: Not At Risk (10/20/2022)   Humiliation, Afraid, Rape, and Kick questionnaire    Fear of Current or Ex-Partner: No    Emotionally Abused: No    Physically Abused: No    Sexually Abused: No    FAMILY HISTORY: Family History  Problem Relation Age of Onset   Breast cancer Mother    Bone cancer Mother    Heart failure Father    Diabetes Mellitus II Father    Hypertension Father    Diabetes Mellitus II Sister    Obesity Sister    Other Sister        retina problem   Hypertension Brother    Diabetes Mellitus II Brother    Colon cancer Neg Hx    Rectal cancer Neg Hx    Stomach cancer Neg Hx    Esophageal cancer Neg Hx    Pancreatic cancer Neg Hx    Liver disease Neg Hx     ALLERGIES:  is allergic to breo ellipta [fluticasone   furoate-vilanterol], gazyva  [obinutuzumab ], iodine , latex, tessalon  [benzonatate ], zocor [simvastatin], levaquin [levofloxacin], penicillins, povidone-iodine , wellbutrin [bupropion], amoxicillin, chlorhexidine , diflucan [fluconazole], thalitone  [chlorthalidone ], amoxicillin-pot clavulanate, and vibra-tab [doxycycline].  MEDICATIONS:  Current Outpatient Medications  Medication Sig Dispense Refill   albuterol  (VENTOLIN  HFA) 108 (90 Base) MCG/ACT inhaler Inhale 1-2 puffs into the lungs every 6 (six) hours as needed (Cough). 18 g 0   aspirin  EC 81 MG tablet Take 1 tablet (81 mg total) by mouth daily. Swallow whole. 90 tablet 3   azelastine  (ASTELIN ) 0.1 % nasal spray Place 1 spray into both nostrils 2 (two) times daily. Use in each nostril as directed 9 mL  11   b complex vitamins capsule Take 1 capsule by mouth in the morning.     bacitracin  500 UNIT/GM ointment Apply topically 2 (two) times daily for 10 days 30 g 0   budesonide -formoterol  (SYMBICORT ) 80-4.5 MCG/ACT inhaler Inhale 2 puffs into the lungs daily. (Patient taking differently: Inhale 2 puffs into the lungs 2 (two) times daily. Patient usually does 2 puffs in morning and 1 puff in evening.) 10.2 g 12   Carboxymethylcellul-Glycerin (REFRESH OPTIVE PF OP) Place 1 drop into both eyes 3 (three) times daily as needed (dry/irritated eyes.). Non-pres     cephALEXin  (KEFLEX ) 500 MG capsule Take 2 capsules (1,000 mg total) by mouth as directed 1 hour prior to dental work. 20 capsule 0   Coenzyme Q10 (CO Q-10) 100 MG CAPS Take 100 mg by mouth in the morning.     Continuous Glucose Sensor (FREESTYLE LIBRE 2 SENSOR) MISC Replace every 14 days and use to check blood sugar continuously. 2 each 3   Continuous Glucose Sensor (FREESTYLE LIBRE 2 SENSOR) MISC Replace every 14 days and use to check blood sugar continuously. 6 each 3   desoximetasone  (TOPICORT ) 0.25 % cream Apply a small amount to skin twice a day as needed for flares 60 g 0   diclofenac  Sodium  (VOLTAREN  ARTHRITIS PAIN) 1 % GEL Apply topically 4 (four) times daily to bilateral knees and ankles 200 g 5   diclofenac  sodium (VOLTAREN ) 1 % GEL Apply 2 g topically 2 (two) times daily as needed (knee pain/ foot /Triger finger).  2   empagliflozin  (JARDIANCE ) 10 MG TABS tablet Take 1 tablet (10 mg total) by mouth daily. 90 tablet 3   fluticasone  (FLONASE ) 50 MCG/ACT nasal spray Place 2 sprays into both nostrils in the morning. 48 g 3   furosemide  (LASIX ) 20 MG tablet Take 1 tablet (20 mg total) by mouth as needed. Take if you gain 1 lb in 24 hours, or 3 lbs in 1 week. Take it daily and stop when weight back to baseline.     gabapentin  (NEURONTIN ) 300 MG capsule Take 1 capsule (300 mg total) by mouth every morning AND 2 capsules (600 mg total) at bedtime. 180 capsule 1   gabapentin  (NEURONTIN ) 300 MG capsule Take 2 capsules (600 mg total) by mouth at bedtime. 180 capsule 3   glucose blood test strip Use to test blood sugar 3 times daily as directed 300 each 3   hydrocortisone  (ANUSOL -HC) 25 MG suppository Place 1 suppository (25 mg total) rectally daily. 30 suppository 0   influenza vaccine adjuvanted (FLUAD) 0.5 ML injection Inject into the muscle. 0.5 mL 0   insulin  aspart (NOVOLOG  FLEXPEN) 100 UNIT/ML FlexPen Inject 30 Units into the skin daily with breakfast AND 30 Units daily with lunch AND 30 Units daily with supper. 90 mL 3   insulin  aspart (NOVOLOG  FLEXPEN) 100 UNIT/ML FlexPen Inject 30 Units into the skin daily with breakfast AND 30 Units daily with lunch AND 30 Units daily with supper. 45 mL 3   insulin  degludec (TRESIBA  FLEXTOUCH) 200 UNIT/ML FlexTouch Pen Inject 80 Units into the skin daily. 30 mL 5   Insulin  Pen Needle 32G X 4 MM MISC Use as directed 5 (five) times daily. 400 each 3   irbesartan  (AVAPRO ) 300 MG tablet Take 1 tablet (300 mg total) by mouth every evening.     irbesartan  (AVAPRO ) 300 MG tablet Take 1 tablet (300 mg total) by mouth in the morning. 90 tablet 3  Lancets  (ONETOUCH DELICA PLUS LANCET33G) MISC Use to test blood sugar 3 times daily 300 each 2   metFORMIN  (GLUCOPHAGE -XR) 500 MG 24 hr tablet Take 2 tablets (1,000 mg total) by mouth 2 (two) times daily. 360 tablet 3   omeprazole  (PRILOSEC) 40 MG capsule Take 1 capsule (40 mg total) by mouth 2 (two) times daily. 180 capsule 3   ondansetron  (ZOFRAN -ODT) 4 MG disintegrating tablet Dissolve 1 tablet (4 mg) by mouth every 8 hours as needed for nausea or vomiting. 20 tablet 0   prednisoLONE  acetate (PRED FORTE ) 1 % ophthalmic suspension Place 1 drop into both eyes 4 (four) times daily. 10 mL 0   prednisoLONE  acetate (PREDNISOLONE  ACETATE P-F) 1 % ophthalmic suspension Place 1 drop into both eyes 4 (four) times daily. 5 mL 0   rosuvastatin  (CRESTOR ) 10 MG tablet Take 1 tablet (10 mg total) by mouth daily. 90 tablet 3   sertraline  (ZOLOFT ) 50 MG tablet Take 1 tablet (50 mg total) by mouth daily. (Patient taking differently: Take 50 mg by mouth every evening.) 90 tablet 3   spironolactone  (ALDACTONE ) 50 MG tablet Take 1 tablet (50 mg total) by mouth daily. 90 tablet 3   terconazole (TERAZOL 3) 0.8 % vaginal cream Place 1 applicator vaginally daily as needed (yeast infections).     venetoclax  (VENCLEXTA ) 100 MG tablet Take 1 tablet (100 mg total) by mouth daily. Tablets should be swallowed whole with a meal and a full glass of water . 30 tablet 3   Zoster Vaccine Adjuvanted (SHINGRIX ) injection Inject into the muscle. 1 each 1   No current facility-administered medications for this visit.    REVIEW OF SYSTEMS:    10 Point review of Systems was done is negative except as noted above.   PHYSICAL EXAMINATION:  .BP (!) 120/57 (BP Location: Right Arm, Patient Position: Sitting)   Pulse 68   Temp 98.6 F (37 C) (Temporal)   Resp 17   Wt 211 lb 14.4 oz (96.1 kg)   SpO2 96%   BMI 36.37 kg/m    GENERAL:alert, in no acute distress and comfortable SKIN: no acute rashes, no significant lesions EYES: conjunctiva  are pink and non-injected, sclera anicteric OROPHARYNX: MMM, no exudates, no oropharyngeal erythema or ulceration NECK: supple, no JVD LYMPH:  no palpable lymphadenopathy in the cervical, axillary or inguinal regions LUNGS: clear to auscultation b/l with normal respiratory effort HEART: regular rate & rhythm ABDOMEN:  normoactive bowel sounds , non tender, not distended. Extremity: no pedal edema PSYCH: alert & oriented x 3 with fluent speech NEURO: no focal motor/sensory deficits   LABORATORY DATA:  I have reviewed the data as listed  .    Latest Ref Rng & Units 01/16/2024   12:07 PM 12/29/2023   12:21 PM 11/29/2023   10:29 AM  CBC  WBC 4.0 - 10.5 K/uL 4.5  3.9  4.8   Hemoglobin 12.0 - 15.0 g/dL 16.1  09.6  9.6   Hematocrit 36.0 - 46.0 % 37.1  36.4  31.9   Platelets 150 - 400 K/uL 54  51  76    . CBC    Component Value Date/Time   WBC 4.5 01/16/2024 1207   WBC 56.9 (HH) 10/20/2022 1148   RBC 4.58 01/16/2024 1207   HGB 11.9 (L) 01/16/2024 1207   HGB 10.3 (L) 10/18/2021 1222   HCT 37.1 01/16/2024 1207   HCT 33.0 (L) 10/18/2021 1222   PLT 54 (L) 01/16/2024 1207   PLT 100 (LL)  10/18/2021 1222   MCV 81.0 01/16/2024 1207   MCV 84 10/18/2021 1222   MCH 26.0 01/16/2024 1207   MCHC 32.1 01/16/2024 1207   RDW 23.3 (H) 01/16/2024 1207   RDW 15.1 10/18/2021 1222   LYMPHSABS 0.7 01/16/2024 1207   MONOABS 0.5 01/16/2024 1207   EOSABS 0.1 01/16/2024 1207   BASOSABS 0.0 01/16/2024 1207     .    Latest Ref Rng & Units 01/16/2024   12:07 PM 12/29/2023   12:21 PM 11/29/2023   10:29 AM  CMP  Glucose 70 - 99 mg/dL 191  478  295   BUN 8 - 23 mg/dL 31  25  34   Creatinine 0.44 - 1.00 mg/dL 6.21  3.08  6.57   Sodium 135 - 145 mmol/L 136  136  131   Potassium 3.5 - 5.1 mmol/L 5.3  4.5  5.0   Chloride 98 - 111 mmol/L 103  102  102   CO2 22 - 32 mmol/L 26  25  23    Calcium  8.9 - 10.3 mg/dL 9.0  9.2  9.2   Total Protein 6.5 - 8.1 g/dL 6.9  6.9  6.8   Total Bilirubin 0.0 - 1.2 mg/dL  0.6  0.6  0.5   Alkaline Phos 38 - 126 U/L 81  81  93   AST 15 - 41 U/L 20  24  19    ALT 0 - 44 U/L 25  33  22    . Lab Results  Component Value Date   LDH 139 01/16/2024   . Lab Results  Component Value Date   IRON  80 01/16/2024   TIBC 435 01/16/2024   IRONPCTSAT 18 01/16/2024   (Iron  and TIBC)  Lab Results  Component Value Date   FERRITIN 56 01/16/2024    04/08/2020 FISH/CLL Panel:   03/30/2020 Left Axilla Flow Pathology Report [id number]):   03/30/2020 Left axilla lymph node Bx ([id number]):   RADIOGRAPHIC STUDIES: I have personally reviewed the radiological images as listed and agreed with the findings in the report. No results found.   MRI abdomen with and without contrast 07/08/2021 showed IMPRESSION: 1. Moderate motion and patient body habitus degraded exam. 2. Extensive abdominal adenopathy, similar to slightly increased compared to 05/01/2020 PET. 3. Cirrhosis and hepatosplenomegaly with suspicion of periesophageal varices. 4. Nonspecific hypoenhancing or nonenhancing splenic lesions. These could simply represent Macie Saxon bodies in the setting of cirrhosis. Lymphomatous involvement cannot be excluded. 5. Heterogeneous enhancement throughout the liver, without well-defined suspicious mass. Possibly due to altered perfusion in the setting of cirrhosis and porta hepatis adenopathy. Lymphomatous involvement cannot be excluded. Consider pre and post-contrast MRI follow-up at 2-3 months. A more aggressive approach would include random liver biopsy.  MRI abdomen with and without contrast 02/04/2022 showed IMPRESSION: 1. Examination is generally limited by breath motion artifact, particularly multiphasic contrast enhanced sequences. Within this limitation, cirrhotic morphology of the liver without focal liver lesion. 2. 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 3. Trace ascites. 4. Numerous enlarged gastrohepatic ligament, portacaval, retroperitoneal, and small bowel mesenteric lymph nodes are unchanged, in keeping with history of lymphoma.  ASSESSMENT & PLAN:   73 yo female with   1) CLL/SLL -02/28/2020 MM Breast ([id number]) revealed "Bilateral axillary adenopathy." -03/30/2020 Left axilla lymph node biopsy ([id number]) revealed "CHRONIC LYMPHOCYTIC LYMPHOMA/SMALL LYMPHOCYTIC LYMPHOMA".  -03/30/2020 Left Axilla Flow Pathology Report [id number]) revealed "Monoclonal B-cell population with coexpression of CD5  comprises 80% of all lymphocytes". -04/20/20 PET/CT revealed 1. Adenopathy within the neck, chest, abdomen, and pelvis, consistent with active lymphoma. Much of this is not significantly hypermetabolic. Some upper abdominal nodes are moderately hypermetabolic.  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 NO overt GI bleeding noted. . Lab Results  Component Value Date   IRON  80 01/16/2024   TIBC 435 01/16/2024   IRONPCTSAT 18 01/16/2024   (Iron  and TIBC)  Lab Results  Component Value Date   FERRITIN 56 01/16/2024    PLAN:  -Discussed lab results on 01/16/2024 in detail with patient. CBC showed WBC of 4.5K, hemoglobin of 11.9, and platelets of 54K. -ferritin 56 -iron  saturation 18% -continue monthly IVIG x4 -LDH 139 - Patient has no laboratory to evidence of CLL progression at this time.  No peripheral lymphocytosis. - Continues to have thrombocytopenia primarily from her liver cirrhosis with hypersplenism.  Notes that she  does have a follow-up with Dr. Willy Harvest from GI. - Tolerating her venetoclax  without any significant toxicities at this time.  Will continue venetoclax  at 100 mg p.o. daily at this time. - No acute indication for additional IV iron  at this time hemoglobin is stable at 11.9 with a ferritin of more than 50. - Patient has not had any acute anemia infections while on IVIG and we shall continue the same every month  FOLLOW-UP: RTC with Dr Salomon Cree with labs in 3 months -continue monthly IVG x 4  The total time spent in the appointment was 30 minutes* .  All of the patient's questions were answered with apparent satisfaction. The patient knows to call the clinic with any problems, questions or concerns.   Jacquelyn Matt MD MS AAHIVMS [facility] Hematology/Oncology Physician [facility]  .*Total Encounter Time as defined by the Centers for Medicare and Medicaid Services includes, in addition to the face-to-face time of a patient visit (documented in the note above) non-face-to-face time: obtaining and reviewing outside history, ordering and reviewing medications, tests or procedures, care coordination (communications with other health care professionals or caregivers) and documentation in the medical record.    I,Mitra Faeizi,acting as a Neurosurgeon for Jacquelyn Matt, MD.,have documented all relevant documentation on the behalf of Jacquelyn Matt, MD,as directed by  Jacquelyn Matt, MD while in the presence of Jacquelyn Matt, MD.  .I have reviewed the above documentation for accuracy and completeness, and I agree with the above. .Kessie Croston Kishore Edwen Mclester MD

## 2024-01-22 ENCOUNTER — Encounter: Payer: Self-pay | Admitting: Hematology

## 2024-01-23 ENCOUNTER — Other Ambulatory Visit: Payer: Self-pay

## 2024-01-23 ENCOUNTER — Other Ambulatory Visit (HOSPITAL_COMMUNITY): Payer: Self-pay

## 2024-01-24 ENCOUNTER — Other Ambulatory Visit (HOSPITAL_COMMUNITY): Payer: Self-pay

## 2024-01-24 MED ORDER — ONETOUCH VERIO VI STRP
ORAL_STRIP | 3 refills | Status: AC
  Filled 2024-01-24: qty 300, 90d supply, fill #0

## 2024-01-25 ENCOUNTER — Other Ambulatory Visit (HOSPITAL_COMMUNITY): Payer: Self-pay

## 2024-01-25 ENCOUNTER — Other Ambulatory Visit: Payer: Self-pay

## 2024-01-25 ENCOUNTER — Other Ambulatory Visit: Payer: Self-pay | Admitting: Cardiology

## 2024-01-25 ENCOUNTER — Other Ambulatory Visit (HOSPITAL_BASED_OUTPATIENT_CLINIC_OR_DEPARTMENT_OTHER): Payer: Self-pay

## 2024-01-25 DIAGNOSIS — G4733 Obstructive sleep apnea (adult) (pediatric): Secondary | ICD-10-CM | POA: Diagnosis not present

## 2024-01-25 MED ORDER — FUROSEMIDE 20 MG PO TABS
20.0000 mg | ORAL_TABLET | ORAL | 6 refills | Status: AC | PRN
  Filled 2024-01-25: qty 30, 30d supply, fill #0
  Filled 2024-04-03: qty 30, 30d supply, fill #1
  Filled 2024-06-11: qty 30, 30d supply, fill #2

## 2024-01-25 MED ORDER — SERTRALINE HCL 50 MG PO TABS
50.0000 mg | ORAL_TABLET | Freq: Every day | ORAL | 3 refills | Status: AC
  Filled 2024-01-25: qty 90, 90d supply, fill #0
  Filled 2024-05-07: qty 90, 90d supply, fill #1
  Filled 2024-06-09 – 2024-08-01 (×2): qty 90, 90d supply, fill #2

## 2024-01-26 ENCOUNTER — Other Ambulatory Visit: Payer: Self-pay

## 2024-01-26 DIAGNOSIS — C9110 Chronic lymphocytic leukemia of B-cell type not having achieved remission: Secondary | ICD-10-CM

## 2024-01-29 ENCOUNTER — Other Ambulatory Visit: Payer: Self-pay

## 2024-01-29 ENCOUNTER — Inpatient Hospital Stay: Payer: PPO

## 2024-01-29 ENCOUNTER — Other Ambulatory Visit (HOSPITAL_COMMUNITY): Payer: Self-pay

## 2024-01-29 VITALS — BP 111/42 | HR 82 | Temp 98.1°F | Resp 17 | Wt 213.5 lb

## 2024-01-29 DIAGNOSIS — D801 Nonfamilial hypogammaglobulinemia: Secondary | ICD-10-CM

## 2024-01-29 DIAGNOSIS — H2511 Age-related nuclear cataract, right eye: Secondary | ICD-10-CM | POA: Diagnosis not present

## 2024-01-29 DIAGNOSIS — C9110 Chronic lymphocytic leukemia of B-cell type not having achieved remission: Secondary | ICD-10-CM

## 2024-01-29 DIAGNOSIS — C8300 Small cell B-cell lymphoma, unspecified site: Secondary | ICD-10-CM

## 2024-01-29 LAB — CBC WITH DIFFERENTIAL (CANCER CENTER ONLY)
Abs Immature Granulocytes: 0.01 10*3/uL (ref 0.00–0.07)
Basophils Absolute: 0 10*3/uL (ref 0.0–0.1)
Basophils Relative: 1 %
Eosinophils Absolute: 0.1 10*3/uL (ref 0.0–0.5)
Eosinophils Relative: 2 %
HCT: 38.7 % (ref 36.0–46.0)
Hemoglobin: 12.4 g/dL (ref 12.0–15.0)
Immature Granulocytes: 0 %
Lymphocytes Relative: 15 %
Lymphs Abs: 0.8 10*3/uL (ref 0.7–4.0)
MCH: 26.3 pg (ref 26.0–34.0)
MCHC: 32 g/dL (ref 30.0–36.0)
MCV: 82.2 fL (ref 80.0–100.0)
Monocytes Absolute: 0.5 10*3/uL (ref 0.1–1.0)
Monocytes Relative: 10 %
Neutro Abs: 3.7 10*3/uL (ref 1.7–7.7)
Neutrophils Relative %: 72 %
Platelet Count: 60 10*3/uL — ABNORMAL LOW (ref 150–400)
RBC: 4.71 MIL/uL (ref 3.87–5.11)
RDW: 21.4 % — ABNORMAL HIGH (ref 11.5–15.5)
WBC Count: 5.1 10*3/uL (ref 4.0–10.5)
nRBC: 0 % (ref 0.0–0.2)

## 2024-01-29 LAB — CMP (CANCER CENTER ONLY)
ALT: 26 U/L (ref 0–44)
AST: 19 U/L (ref 15–41)
Albumin: 4.7 g/dL (ref 3.5–5.0)
Alkaline Phosphatase: 81 U/L (ref 38–126)
Anion gap: 6 (ref 5–15)
BUN: 39 mg/dL — ABNORMAL HIGH (ref 8–23)
CO2: 25 mmol/L (ref 22–32)
Calcium: 9.3 mg/dL (ref 8.9–10.3)
Chloride: 103 mmol/L (ref 98–111)
Creatinine: 1.2 mg/dL — ABNORMAL HIGH (ref 0.44–1.00)
GFR, Estimated: 48 mL/min — ABNORMAL LOW (ref >60)
Glucose, Bld: 127 mg/dL — ABNORMAL HIGH (ref 70–99)
Potassium: 5.4 mmol/L — ABNORMAL HIGH (ref 3.5–5.1)
Sodium: 134 mmol/L — ABNORMAL LOW (ref 135–145)
Total Bilirubin: 0.6 mg/dL (ref 0.0–1.2)
Total Protein: 7.1 g/dL (ref 6.5–8.1)

## 2024-01-29 LAB — IRON AND IRON BINDING CAPACITY (CC-WL,HP ONLY)
Iron: 80 ug/dL (ref 28–170)
Saturation Ratios: 17 % (ref 10.4–31.8)
TIBC: 476 ug/dL — ABNORMAL HIGH (ref 250–450)
UIBC: 396 ug/dL (ref 148–442)

## 2024-01-29 LAB — LACTATE DEHYDROGENASE: LDH: 140 U/L (ref 98–192)

## 2024-01-29 LAB — FERRITIN: Ferritin: 33 ng/mL (ref 11–307)

## 2024-01-29 LAB — URIC ACID: Uric Acid, Serum: 7.9 mg/dL — ABNORMAL HIGH (ref 2.5–7.1)

## 2024-01-29 MED ORDER — DIPHENHYDRAMINE HCL 25 MG PO CAPS
25.0000 mg | ORAL_CAPSULE | Freq: Once | ORAL | Status: AC
  Administered 2024-01-29: 25 mg via ORAL
  Filled 2024-01-29: qty 1

## 2024-01-29 MED ORDER — ACETAMINOPHEN 325 MG PO TABS
650.0000 mg | ORAL_TABLET | Freq: Once | ORAL | Status: AC
Start: 2024-01-29 — End: 2024-01-29
  Administered 2024-01-29: 650 mg via ORAL
  Filled 2024-01-29: qty 2

## 2024-01-29 MED ORDER — IMMUNE GLOBULIN (HUMAN) 10 GM/100ML IV SOLN
400.0000 mg/kg | INTRAVENOUS | Status: DC
  Administered 2024-01-29: 40 g via INTRAVENOUS
  Filled 2024-01-29: qty 400

## 2024-01-29 MED ORDER — DEXTROSE 5 % IV SOLN: INTRAVENOUS | Status: DC

## 2024-01-29 MED ORDER — ONDANSETRON HCL 4 MG/2ML IJ SOLN
4.0000 mg | Freq: Once | INTRAMUSCULAR | Status: AC
  Administered 2024-01-29: 4 mg via INTRAVENOUS
  Filled 2024-01-29: qty 2

## 2024-01-29 MED ORDER — METHYLPREDNISOLONE SODIUM SUCC 40 MG IJ SOLR
40.0000 mg | Freq: Every day | INTRAMUSCULAR | Status: DC
  Administered 2024-01-29: 40 mg via INTRAVENOUS
  Filled 2024-01-29: qty 1

## 2024-01-29 NOTE — Progress Notes (Signed)
 Patient declined post IVIG observation.  Tolerated treatment well without incident.  VSS at discharge.  Ambulated to lobby.

## 2024-01-29 NOTE — Patient Instructions (Signed)
 Immune Globulin Injection What is this medication? IMMUNE GLOBULIN (im MUNE GLOB yoo lin) treats many immune system conditions. It works by Designer, multimedia extra antibodies. Antibodies are proteins made by the immune system that help protect the body. This medicine may be used for other purposes; ask your health care provider or pharmacist if you have questions. COMMON BRAND NAME(S): ASCENIV, Baygam, BIVIGAM, Carimune, Carimune NF, cutaquig, Cuvitru, Flebogamma, Flebogamma DIF, GamaSTAN, GamaSTAN S/D, Gamimune N, Gammagard, Gammagard S/D, Gammaked, Gammaplex, Gammar-P IV, Gamunex, Gamunex-C, Hizentra, Iveegam, Iveegam EN, Octagam, Panglobulin, Panglobulin NF, panzyga, Polygam S/D, Privigen, Sandoglobulin, Venoglobulin-S, Vigam, Vivaglobulin, Xembify What should I tell my care team before I take this medication? They need to know if you have any of these conditions: Blood clotting disorder Condition where you have excess fluid in your body, such as heart failure or edema Dehydration Diabetes Have had blood clots Heart disease Immune system conditions Kidney disease Low levels of IgA Recent or upcoming vaccine An unusual or allergic reaction to immune globulin, other medications, foods, dyes, or preservatives Pregnant or trying to get pregnant Breastfeeding How should I use this medication? This medication is infused into a vein or under the skin. It is usually given by your care team in a hospital or clinic setting. It may also be given at home. If you get this medication at home, you will be taught how to prepare and give it. Use exactly as directed. Take it as directed on the prescription label at the same time every day. Keep taking it unless your care team tells you to stop. It is important that you put your used needles and syringes in a special sharps container. Do not put them in a trash can. If you do not have a sharps container, call your pharmacist or care team to get one. Talk to  your care team about the use of this medication in children. While it may be given to children for selected conditions, precautions do apply. Overdosage: If you think you have taken too much of this medicine contact a poison control center or emergency room at once. NOTE: This medicine is only for you. Do not share this medicine with others. What if I miss a dose? If you get this medication at the hospital or clinic: It is important not to miss your dose. Call your care team if you are unable to keep an appointment. If you give yourself this medication at home: If you miss a dose, take it as soon as you can. Then continue your normal schedule. If it is almost time for your next dose, take only that dose. Do not take double or extra doses. Call your care team with questions. What may interact with this medication? Live virus vaccines This list may not describe all possible interactions. Give your health care provider a list of all the medicines, herbs, non-prescription drugs, or dietary supplements you use. Also tell them if you smoke, drink alcohol, or use illegal drugs. Some items may interact with your medicine. What should I watch for while using this medication? Your condition will be monitored carefully while you are receiving this medication. Tell your care team if your symptoms do not start to get better or if they get worse. You may need blood work done while you are taking this medication. This medication increases the risk of blood clots. People with heart, blood vessel, or blood clotting conditions are more likely to develop a blood clot. Other risk factors include advanced age, estrogen  use, tobacco use, lack of movement, and being overweight. This medication can decrease the response to a vaccine. If you need to get vaccinated, tell your care team if you have received this medication within the last year. Extra booster doses may be needed. Talk to your care team to see if a different  vaccination schedule is needed. If you have diabetes, you may get a falsely elevated blood sugar reading. Talk to your care team about how to check your blood sugar while taking this medication. What side effects may I notice from receiving this medication? Side effects that you should report to your care team as soon as possible: Allergic reactions--skin rash, itching, hives, swelling of the face, lips, tongue, or throat Blood clot--pain, swelling, or warmth in the leg, shortness of breath, chest pain Fever, neck pain or stiffness, sensitivity to light, headache, nausea, vomiting, confusion, which may be signs of meningitis Hemolytic anemia--unusual weakness or fatigue, dizziness, headache, trouble breathing, dark urine, yellowing skin or eyes Kidney injury--decrease in the amount of urine, swelling of the ankles, hands, or feet Low sodium level--muscle weakness, fatigue, dizziness, headache, confusion Shortness of breath or trouble breathing, cough, unusual weakness or fatigue, blue skin or lips Side effects that usually do not require medical attention (report these to your care team if they continue or are bothersome): Chills Diarrhea Fever Headache Nausea This list may not describe all possible side effects. Call your doctor for medical advice about side effects. You may report side effects to FDA at 1-800-FDA-1088. Where should I keep my medication? Keep out of the reach of children and pets. You will be instructed on how to store this medication. Get rid of any unused medication after the expiration date. To get rid of medications that are no longer needed or have expired: Take the medication to a medication take-back program. Check with your pharmacy or law enforcement to find a location. If you cannot return the medication, ask your pharmacist or care team how to get rid of this medication safely. NOTE: This sheet is a summary. It may not cover all possible information. If you have  questions about this medicine, talk to your doctor, pharmacist, or health care provider.  2024 Elsevier/Gold Standard (2023-08-11 00:00:00)

## 2024-02-01 DIAGNOSIS — H2511 Age-related nuclear cataract, right eye: Secondary | ICD-10-CM | POA: Diagnosis not present

## 2024-02-02 ENCOUNTER — Other Ambulatory Visit (HOSPITAL_COMMUNITY): Payer: Self-pay

## 2024-02-03 ENCOUNTER — Other Ambulatory Visit (HOSPITAL_COMMUNITY): Payer: Self-pay

## 2024-02-06 ENCOUNTER — Other Ambulatory Visit (HOSPITAL_COMMUNITY): Payer: Self-pay

## 2024-02-07 ENCOUNTER — Other Ambulatory Visit: Payer: Self-pay

## 2024-02-07 NOTE — Progress Notes (Signed)
 Specialty Pharmacy Ongoing Clinical Assessment Note  Janice [name] is a 73 y.o. female who is being followed by the specialty pharmacy service for RxSp Oncology   Patient's specialty medication(s) reviewed today: Venetoclax  (VENCLEXTA )   Missed doses in the last 4 weeks: 0   Patient/Caregiver did not have any additional questions or concerns.   Therapeutic benefit summary: Patient is achieving benefit   Adverse events/side effects summary: Experienced adverse events/side effects (experiencing some constipation, but tolerable. Increasing fluids and taking bisacodyl if needed)   Patient's therapy is appropriate to: Continue    Goals Addressed             This Visit's Progress    Achieve or maintain remission   No change    Patient is not on track and no change. Patient will maintain adherence.  Dr. Salomon Cree reported at the office visit on 01/16/24 that patient had no laboratory evidence of CLL progression and no peripheral lymphocytosis.         Follow up: 3 months  [facility]

## 2024-02-07 NOTE — Progress Notes (Signed)
 Specialty Pharmacy Refill Coordination Note  Janice [name] is a 73 y.o. female contacted today regarding refills of specialty medication(s) Venetoclax  (VENCLEXTA )   Patient requested Delivery   Delivery date: 02/13/24   Verified address: [address] [place] [address]   Medication will be filled on 02/12/24.

## 2024-02-09 ENCOUNTER — Other Ambulatory Visit (HOSPITAL_COMMUNITY): Payer: Self-pay

## 2024-02-19 DIAGNOSIS — G4733 Obstructive sleep apnea (adult) (pediatric): Secondary | ICD-10-CM | POA: Diagnosis not present

## 2024-02-27 ENCOUNTER — Other Ambulatory Visit: Payer: Self-pay

## 2024-02-27 DIAGNOSIS — C9110 Chronic lymphocytic leukemia of B-cell type not having achieved remission: Secondary | ICD-10-CM

## 2024-02-27 NOTE — Progress Notes (Incomplete)
 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  INTERVAL HISTORY:  Janice [name] is a 73 y.o. 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  Patient was last seen by me on 01/16/2024 and reported continued oral ulcers.   She reports that she did not take Venetoclax  yesterday or today due to attributing her diarrhea to the medication. Patient reports that she continued to have some diarrhea yesterday and today, though her diarrhea was not as bothersome.   She generally has 3 bowel movements daily. Patient reports stools are watery with diarrhea.   Patient reports that she generally tries to limit her use of Imodium to some extent due to concern for constipation.    -if constipation for a who  Patient reports that her diarrhea issues began soon after [facility], and has persisted intermittently every couple of days.    -her diarrhea is most likely related to her fairly large dose of Metformin   She reports that since being off of fluid pills, she has had more fluid retention.      On fiarly large dose metformin  Two pills twice a day -met most lieklt causing diarrhea. Talk to PCP to consider adjusting Metformin  for DM.   -unsure if ven , given 1/4 of regular dose   -option to hold ven and see -patient prefers to connect with PCP and discuss metformi  -with liver scerrosis, sometimes need to dile down metformin  dose.   -met in context liver disease  can casue lactic acidosis  She was thinking about connecting with GI to discuss doarrhea.   -if diarrhea   But diarrhea is all-day long, twice a week.  It is separeted by a period of constipation Though not always constipated.   She complains of bloating.   -diarrhea is likely from a combination of factors  -bloating can be fluid, gas, indigestion, changes in way bowel peristalsis movements occur as result of DM   -if CLL is stable and diarrhea very both, option to stop ven. But   She reports that her blood sugars have been generally been elevated.   Once started having eye drops for cataract surgery, since then elevated blood sugars.   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   -switch metformin  smth else?  She reports occasional teeth chattering. Patient denies any fever, chills, or night sweats otherwise.   Patient denies any new lumps/bumps,     -did not feel any enlarged lymph nodes during physical examination  -reviewed her last CT Abdomen/Pelvis scan from December 2024, which showed 1. Uncomplicated diverticulitis of the proximal descending colon. 2. Known cirrhosis.  Known lymphadenopathy with history of CLL.  -discussed that portal hypertension can also cause bloating  -will continue her same dose venetoclax   She denies any infection issues  with IVIG.        -Discussed lab results on 02/28/2024 in detail with patient. CBC stable, showed WBC of 6.3K, hemoglobin of 11.8, and platelets of 61K. -CMP stable  -Iron  labs are pending at this time  -proceed with IVIG today   -answered all of patient's questions in detail  MEDICAL HISTORY:  Past Medical History:  Diagnosis Date   Allergy    Asthma     seasonal   Cellulitis 2013   Left toe   Chronic kidney disease    stage 1   Cirrhosis (HCC)    Common migraine    History of   Complication of anesthesia    Per pt/had breathing problems with block during rotator cuff surgery. 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   Coronary artery disease    DDD (degenerative disc disease)    Depression    denies takes paxil for migraines   Diabetes mellitus    type 2   Diabetic peripheral neuropathy (HCC)    Difficult airway for intubation 10/20/2022   DLx 1 with MAC 4, unable to see cords, DL with lopro S3 glidescope, easy atraumatic intubation   Difficult intubation 03/03/2023   Diverticulitis    Diverticulosis    DJD (degenerative joint disease)    Esophageal varices (HCC)    GERD (gastroesophageal reflux disease)    History of colon polyps    hyperplastic   History of gastric polyp    Hyperlipidemia    Hypertension    Iron  deficiency anemia    Obesity    OSA on CPAP    cpap   Peripheral neuropathy    Pneumonia    april 2020  mild   Primary localized osteoarthritis of left knee 03/27/2019   Sensorineural hearing loss    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HCC)     SURGICAL HISTORY: Past Surgical History:  Procedure Laterality Date   ANKLE SURGERY     Left    BREAST BIOPSY Left 2021   left axilla, lymphoma   COLONOSCOPY  05/2018   ESOPHAGOGASTRODUODENOSCOPY (EGD) WITH PROPOFOL  N/A 03/09/2023   Procedure: ESOPHAGOGASTRODUODENOSCOPY (EGD) WITH PROPOFOL ;  Surgeon: Kenney Peacemaker, MD;  Location: [facility];  Service: Gastroenterology;  Laterality: N/A;   LEEP N/A 09/14/2018   Procedure: LOOP ELECTROSURGICAL EXCISION PROCEDURE (LEEP);  Surgeon: Merryl Abraham, MD;  Location: [facility];  Service: Gynecology;  Laterality: N/A;   MASS EXCISION Right 06/24/2021   Procedure: EXCISION RIGHT POSTERIOR NECK MASS;  Surgeon: Kinsinger, Alphonso Aschoff, MD;  Location: [facility];  Service: General;  Laterality: Right;   ROTATOR CUFF  REPAIR Bilateral 2012, 2015   TONSILLECTOMY     TOTAL KNEE ARTHROPLASTY Left 04/08/2019   Procedure: TOTAL KNEE ARTHROPLASTY;  Surgeon: Elly Habermann, MD;  Location: [facility];  Service: Orthopedics;  Laterality: Left;   UMBILICAL HERNIA REPAIR N/A 10/20/2022   Procedure: OPEN HERNIA REPAIR UMBILICAL ADULT with Mesh;  Surgeon: Kinsinger, Alphonso Aschoff, MD;  Location: [facility];  Service: General;  Laterality: N/A;   UPPER GI ENDOSCOPY  05/2018    SOCIAL HISTORY: Social History   Socioeconomic History   Marital status: Widowed    Spouse name: Not on file   Number of children: 0   Years of education: 3 years college   Highest education level: Not on file  Occupational History   Occupation: Unemployed  Tobacco Use   Smoking status: Never    Passive exposure: Past   Smokeless tobacco: Never  Vaping Use  Vaping status: Never Used  Substance and Sexual Activity   Alcohol  use: Not Currently    Comment: very rare   Drug use: No   Sexual activity: Not Currently    Birth control/protection: None, Post-menopausal  Other Topics Concern   Not on file  Social History Narrative   1 caffeine drink daily    Right-handed   widow (husband died from [place] cirrhosis)   Social Drivers of Health   Financial Resource Strain: Medium Risk (08/20/2020)   Overall Financial Resource Strain (CARDIA)    Difficulty of Paying Living Expenses: Somewhat hard  Food Insecurity: No Food Insecurity (10/20/2022)   Hunger Vital Sign    Worried About Running Out of Food in the Last Year: Never true    Ran Out of Food in the Last Year: Never true  Transportation Needs: No Transportation Needs (10/20/2022)   PRAPARE - Administrator, Civil Service (Medical): No    Lack of Transportation (Non-Medical): No  Physical Activity: Not on file  Stress: Not on file  Social Connections: Unknown (01/25/2022)   [facility] from [facility]   Social Network    Social Network: Not on file  Intimate Partner Violence: Not  At Risk (10/20/2022)   Humiliation, Afraid, Rape, and Kick questionnaire    Fear of Current or Ex-Partner: No    Emotionally Abused: No    Physically Abused: No    Sexually Abused: No    FAMILY HISTORY: Family History  Problem Relation Age of Onset   Breast cancer Mother    Bone cancer Mother    Heart failure Father    Diabetes Mellitus II Father    Hypertension Father    Diabetes Mellitus II Sister    Obesity Sister    Other Sister        retina problem   Hypertension Brother    Diabetes Mellitus II Brother    Colon cancer Neg Hx    Rectal cancer Neg Hx    Stomach cancer Neg Hx    Esophageal cancer Neg Hx    Pancreatic cancer Neg Hx    Liver disease Neg Hx     ALLERGIES:  is allergic to breo ellipta [fluticasone  furoate-vilanterol], gazyva  [obinutuzumab ], iodine , latex, tessalon  [benzonatate ], zocor [simvastatin], levaquin [levofloxacin], penicillins, povidone-iodine , wellbutrin [bupropion], amoxicillin, chlorhexidine , diflucan [fluconazole], thalitone  [chlorthalidone ], amoxicillin-pot clavulanate, and vibra-tab [doxycycline].  MEDICATIONS:  Current Outpatient Medications  Medication Sig Dispense Refill   albuterol  (VENTOLIN  HFA) 108 (90 Base) MCG/ACT inhaler Inhale 1-2 puffs into the lungs every 6 (six) hours as needed (Cough). 18 g 0   aspirin  EC 81 MG tablet Take 1 tablet (81 mg total) by mouth daily. Swallow whole. 90 tablet 3   azelastine  (ASTELIN ) 0.1 % nasal spray Place 1 spray into both nostrils 2 (two) times daily. Use in each nostril as directed 9 mL 11   b complex vitamins capsule Take 1 capsule by mouth in the morning.     bacitracin  500 UNIT/GM ointment Apply topically 2 (two) times daily for 10 days 30 g 0   budesonide -formoterol  (SYMBICORT ) 80-4.5 MCG/ACT inhaler Inhale 2 puffs into the lungs daily. (Patient taking differently: Inhale 2 puffs into the lungs 2 (two) times daily. Patient usually does 2 puffs in morning and 1 puff in evening.) 10.2 g 12    Carboxymethylcellul-Glycerin (REFRESH OPTIVE PF OP) Place 1 drop into both eyes 3 (three) times daily as needed (dry/irritated eyes.). Non-pres     cephALEXin  (KEFLEX ) 500 MG capsule Take  2 capsules (1,000 mg total) by mouth as directed 1 hour prior to dental work. 20 capsule 0   Coenzyme Q10 (CO Q-10) 100 MG CAPS Take 100 mg by mouth in the morning.     Continuous Glucose Sensor (FREESTYLE LIBRE 2 SENSOR) MISC Replace every 14 days and use to check blood sugar continuously. 2 each 3   Continuous Glucose Sensor (FREESTYLE LIBRE 2 SENSOR) MISC Replace every 14 days and use to check blood sugar continuously. 6 each 3   desoximetasone  (TOPICORT ) 0.25 % cream Apply a small amount to skin twice a day as needed for flares 60 g 0   diclofenac  Sodium (VOLTAREN  ARTHRITIS PAIN) 1 % GEL Apply topically 4 (four) times daily to bilateral knees and ankles 200 g 5   diclofenac  sodium (VOLTAREN ) 1 % GEL Apply 2 g topically 2 (two) times daily as needed (knee pain/ foot /Triger finger).  2   empagliflozin  (JARDIANCE ) 10 MG TABS tablet Take 1 tablet (10 mg total) by mouth daily. 90 tablet 3   fluticasone  (FLONASE ) 50 MCG/ACT nasal spray Place 2 sprays into both nostrils in the morning. 48 g 3   furosemide  (LASIX ) 20 MG tablet Take 1 tablet (20 mg total) by mouth as needed. Take if you gain 1 lb in 24 hours, or 3 lbs in 1 week. Take it daily and stop when weight back to baseline. 30 tablet 6   gabapentin  (NEURONTIN ) 300 MG capsule Take 1 capsule (300 mg total) by mouth every morning AND 2 capsules (600 mg total) at bedtime. 180 capsule 1   gabapentin  (NEURONTIN ) 300 MG capsule Take 2 capsules (600 mg total) by mouth at bedtime. 180 capsule 3   glucose blood (ONETOUCH VERIO) test strip Use as directed to check blood sugar 3 times a day. 300 strip 3   glucose blood test strip Use to test blood sugar 3 times daily as directed 300 each 3   hydrocortisone  (ANUSOL -HC) 25 MG suppository Place 1 suppository (25 mg total)  rectally daily. 30 suppository 0   influenza vaccine adjuvanted (FLUAD ) 0.5 ML injection Inject into the muscle. 0.5 mL 0   insulin  aspart (NOVOLOG  FLEXPEN) 100 UNIT/ML FlexPen Inject 30 Units into the skin daily with breakfast AND 30 Units daily with lunch AND 30 Units daily with supper. 90 mL 3   insulin  aspart (NOVOLOG  FLEXPEN) 100 UNIT/ML FlexPen Inject 30 Units into the skin daily with breakfast AND 30 Units daily with lunch AND 30 Units daily with supper. 45 mL 3   insulin  degludec (TRESIBA  FLEXTOUCH) 200 UNIT/ML FlexTouch Pen Inject 80 Units into the skin daily. 30 mL 5   Insulin  Pen Needle 32G X 4 MM MISC Use as directed 5 (five) times daily. 400 each 3   irbesartan  (AVAPRO ) 300 MG tablet Take 1 tablet (300 mg total) by mouth every evening.     irbesartan  (AVAPRO ) 300 MG tablet Take 1 tablet (300 mg total) by mouth in the morning. 90 tablet 3   Lancets (ONETOUCH DELICA PLUS LANCET33G) MISC Use to test blood sugar 3 times daily 300 each 2   metFORMIN  (GLUCOPHAGE -XR) 500 MG 24 hr tablet Take 2 tablets (1,000 mg total) by mouth 2 (two) times daily. 360 tablet 3   omeprazole  (PRILOSEC) 40 MG capsule Take 1 capsule (40 mg total) by mouth 2 (two) times daily. 180 capsule 3   ondansetron  (ZOFRAN -ODT) 4 MG disintegrating tablet Dissolve 1 tablet (4 mg) by mouth every 8 hours as needed for nausea or  vomiting. 20 tablet 0   prednisoLONE  acetate (PRED FORTE ) 1 % ophthalmic suspension Place 1 drop into both eyes 4 (four) times daily. 10 mL 0   prednisoLONE  acetate (PREDNISOLONE  ACETATE P-F) 1 % ophthalmic suspension Place 1 drop into both eyes 4 (four) times daily. 5 mL 0   rosuvastatin  (CRESTOR ) 10 MG tablet Take 1 tablet (10 mg total) by mouth daily. 90 tablet 3   sertraline  (ZOLOFT ) 50 MG tablet Take 1 tablet (50 mg total) by mouth daily. 90 tablet 3   spironolactone  (ALDACTONE ) 50 MG tablet Take 1 tablet (50 mg total) by mouth daily. 90 tablet 3   terconazole (TERAZOL 3) 0.8 % vaginal cream Place 1  applicator vaginally daily as needed (yeast infections).     venetoclax  (VENCLEXTA ) 100 MG tablet Take 1 tablet (100 mg total) by mouth daily. Tablets should be swallowed whole with a meal and a full glass of water . 30 tablet 3   Zoster Vaccine Adjuvanted (SHINGRIX ) injection Inject into the muscle. 1 each 1   No current facility-administered medications for this visit.    REVIEW OF SYSTEMS:    10 Point review of Systems was done is negative except as noted above.   PHYSICAL EXAMINATION:  .There were no vitals taken for this visit.   GENERAL:alert, in no acute distress and comfortable SKIN: no acute rashes, no significant lesions EYES: conjunctiva are pink and non-injected, sclera anicteric OROPHARYNX: MMM, no exudates, no oropharyngeal erythema or ulceration NECK: supple, no JVD LYMPH:  no palpable lymphadenopathy in the cervical, axillary or inguinal regions LUNGS: clear to auscultation b/l with normal respiratory effort HEART: regular rate & rhythm ABDOMEN:  normoactive bowel sounds , non tender, not distended. Extremity: no pedal edema PSYCH: alert & oriented x 3 with fluent speech NEURO: no focal motor/sensory deficits   LABORATORY DATA:  I have reviewed the data as listed  .    Latest Ref Rng & Units 01/29/2024   12:14 PM 01/16/2024   12:07 PM 12/29/2023   12:21 PM  CBC  WBC 4.0 - 10.5 K/uL 5.1  4.5  3.9   Hemoglobin 12.0 - 15.0 g/dL 16.1  09.6  04.5   Hematocrit 36.0 - 46.0 % 38.7  37.1  36.4   Platelets 150 - 400 K/uL 60  54  51    . CBC    Component Value Date/Time   WBC 5.1 01/29/2024 1214   WBC 56.9 (HH) 10/20/2022 1148   RBC 4.71 01/29/2024 1214   HGB 12.4 01/29/2024 1214   HGB 10.3 (L) 10/18/2021 1222   HCT 38.7 01/29/2024 1214   HCT 33.0 (L) 10/18/2021 1222   PLT 60 (L) 01/29/2024 1214   PLT 100 (LL) 10/18/2021 1222   MCV 82.2 01/29/2024 1214   MCV 84 10/18/2021 1222   MCH 26.3 01/29/2024 1214   MCHC 32.0 01/29/2024 1214   RDW 21.4 (H) 01/29/2024  1214   RDW 15.1 10/18/2021 1222   LYMPHSABS 0.8 01/29/2024 1214   MONOABS 0.5 01/29/2024 1214   EOSABS 0.1 01/29/2024 1214   BASOSABS 0.0 01/29/2024 1214     .    Latest Ref Rng & Units 01/29/2024   12:14 PM 01/16/2024   12:07 PM 12/29/2023   12:21 PM  CMP  Glucose 70 - 99 mg/dL 409  811  914   BUN 8 - 23 mg/dL 39  31  25   Creatinine 0.44 - 1.00 mg/dL 7.82  9.56  2.13   Sodium 135 - 145  mmol/L 134  136  136   Potassium 3.5 - 5.1 mmol/L 5.4  5.3  4.5   Chloride 98 - 111 mmol/L 103  103  102   CO2 22 - 32 mmol/L 25  26  25    Calcium  8.9 - 10.3 mg/dL 9.3  9.0  9.2   Total Protein 6.5 - 8.1 g/dL 7.1  6.9  6.9   Total Bilirubin 0.0 - 1.2 mg/dL 0.6  0.6  0.6   Alkaline Phos 38 - 126 U/L 81  81  81   AST 15 - 41 U/L 19  20  24    ALT 0 - 44 U/L 26  25  33    . Lab Results  Component Value Date   LDH 140 01/29/2024   . Lab Results  Component Value Date   IRON  80 01/29/2024   TIBC 476 (H) 01/29/2024   IRONPCTSAT 17 01/29/2024   (Iron  and TIBC)  Lab Results  Component Value Date   FERRITIN 33 01/29/2024    04/08/2020 FISH/CLL Panel:   03/30/2020 Left Axilla Flow Pathology Report [id number]):   03/30/2020 Left axilla lymph node Bx ([id number]):   RADIOGRAPHIC STUDIES: I have personally reviewed the radiological images as listed and agreed with the findings in the report. No results found.   MRI abdomen with and without contrast 07/08/2021 showed IMPRESSION: 1. Moderate motion and patient body habitus degraded exam. 2. Extensive abdominal adenopathy, similar to slightly increased compared to 05/01/2020 PET. 3. Cirrhosis and hepatosplenomegaly with suspicion of periesophageal varices. 4. Nonspecific hypoenhancing or nonenhancing splenic lesions. These could simply represent Macie Saxon bodies in the setting of cirrhosis. Lymphomatous involvement cannot be excluded. 5. Heterogeneous enhancement throughout the liver, without well-defined suspicious mass.  Possibly due to altered perfusion in the setting of cirrhosis and porta hepatis adenopathy. Lymphomatous involvement cannot be excluded. Consider pre and post-contrast MRI follow-up at 2-3 months. A more aggressive approach would include random liver biopsy.  MRI abdomen with and without contrast 02/04/2022 showed IMPRESSION: 1. Examination is generally limited by breath motion artifact, particularly multiphasic contrast enhanced sequences. Within this limitation, cirrhotic morphology of the liver without focal liver lesion. 2. 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 3. Trace ascites. 4. Numerous enlarged gastrohepatic ligament, portacaval, retroperitoneal, and small bowel mesenteric lymph nodes are unchanged, in keeping with history of lymphoma.  ASSESSMENT & PLAN:   73 yo female with   1) CLL/SLL -02/28/2020 MM Breast ([id number]) revealed Bilateral axillary adenopathy. -03/30/2020 Left axilla lymph node biopsy ([id number]) revealed CHRONIC LYMPHOCYTIC LYMPHOMA/SMALL LYMPHOCYTIC LYMPHOMA.  -03/30/2020 Left Axilla Flow Pathology Report [id number]) revealed Monoclonal B-cell population with coexpression of CD5 comprises 80% of all lymphocytes. -04/20/20 PET/CT revealed 1. Adenopathy within the neck, chest, abdomen, and pelvis, consistent with active lymphoma. Much of this is not significantly hypermetabolic. Some upper abdominal nodes are moderately hypermetabolic.  2) Mild thrombocytopenia Most likely related to advanced liver cirrhosis and partly could also be from CLL  #3 severe hypogammaglobinemia due to CLL IgG level 272 Component     Latest Ref Rng 09/20/2022  IgG (Immunoglobin G), Serum     586 - 1,602 mg/dL 846 (L)   IgA     64 - 422 mg/dL 15 (L)   IgM (Immunoglobulin M), Srm     26 - 217 mg/dL <5 (L)     Legend: (L) Low  #4 iron  deficiency Anemia-- from  Epistaxis + ?GI losses.  NO overt GI bleeding noted. . Lab Results  Component Value Date   IRON  80 01/29/2024   TIBC 476 (H) 01/29/2024   IRONPCTSAT 17 01/29/2024   (Iron  and TIBC)  Lab Results  Component Value Date   FERRITIN 33 01/29/2024    PLAN:  -Discussed lab results on 01/16/2024 in detail with patient. CBC showed WBC of 4.5K, hemoglobin of 11.9, and platelets of 54K. -ferritin 56 -iron  saturation 18% -continue monthly IVIG x4 -LDH 139 - Patient has no laboratory to evidence of CLL progression at this time.  No peripheral lymphocytosis. - Continues to have thrombocytopenia primarily from her liver cirrhosis with hypersplenism.  Notes that she does have a follow-up with Dr. Willy Harvest from GI. - Tolerating her venetoclax  without any significant toxicities at this time.  Will continue venetoclax  at 100 mg p.o. daily at this time. - No acute indication for additional IV iron  at this time hemoglobin is stable at 11.9 with a ferritin of more than 50. - Patient has not had any acute anemia infections while on IVIG and we shall continue the same every month  FOLLOW-UP: ***  The total time spent in the appointment was *** minutes* .  All of the patient's questions were answered with apparent satisfaction. The patient knows to call the clinic with any problems, questions or concerns.   Jacquelyn Matt MD MS AAHIVMS [facility] Hematology/Oncology Physician [facility]  .*Total Encounter Time as defined by the Centers for Medicare and Medicaid Services includes, in addition to the face-to-face time of a patient visit (documented in the note above) non-face-to-face time: obtaining and reviewing outside history, ordering and reviewing medications, tests or procedures, care coordination (communications with other health care professionals or caregivers) and documentation in the medical record.    I,Mitra Faeizi,acting as a Neurosurgeon for Jacquelyn Matt, MD.,have documented all relevant  documentation on the behalf of Jacquelyn Matt, MD,as directed by  Jacquelyn Matt, MD while in the presence of Jacquelyn Matt, MD.  ***

## 2024-02-28 ENCOUNTER — Inpatient Hospital Stay: Payer: PPO

## 2024-02-28 ENCOUNTER — Inpatient Hospital Stay: Payer: PPO | Attending: Hematology | Admitting: Hematology

## 2024-02-28 ENCOUNTER — Encounter: Payer: Self-pay | Admitting: Hematology

## 2024-02-28 VITALS — BP 119/42 | HR 70 | Temp 98.5°F | Resp 18

## 2024-02-28 VITALS — BP 119/53 | HR 72 | Temp 97.7°F | Resp 18 | Wt 217.0 lb

## 2024-02-28 DIAGNOSIS — Z79899 Other long term (current) drug therapy: Secondary | ICD-10-CM | POA: Insufficient documentation

## 2024-02-28 DIAGNOSIS — R739 Hyperglycemia, unspecified: Secondary | ICD-10-CM | POA: Insufficient documentation

## 2024-02-28 DIAGNOSIS — K7460 Unspecified cirrhosis of liver: Secondary | ICD-10-CM | POA: Insufficient documentation

## 2024-02-28 DIAGNOSIS — R188 Other ascites: Secondary | ICD-10-CM | POA: Diagnosis not present

## 2024-02-28 DIAGNOSIS — D696 Thrombocytopenia, unspecified: Secondary | ICD-10-CM | POA: Insufficient documentation

## 2024-02-28 DIAGNOSIS — C9110 Chronic lymphocytic leukemia of B-cell type not having achieved remission: Secondary | ICD-10-CM | POA: Diagnosis not present

## 2024-02-28 DIAGNOSIS — R590 Localized enlarged lymph nodes: Secondary | ICD-10-CM | POA: Insufficient documentation

## 2024-02-28 DIAGNOSIS — R140 Abdominal distension (gaseous): Secondary | ICD-10-CM | POA: Insufficient documentation

## 2024-02-28 DIAGNOSIS — D509 Iron deficiency anemia, unspecified: Secondary | ICD-10-CM | POA: Diagnosis not present

## 2024-02-28 DIAGNOSIS — R162 Hepatomegaly with splenomegaly, not elsewhere classified: Secondary | ICD-10-CM | POA: Insufficient documentation

## 2024-02-28 DIAGNOSIS — D801 Nonfamilial hypogammaglobulinemia: Secondary | ICD-10-CM | POA: Insufficient documentation

## 2024-02-28 DIAGNOSIS — C8300 Small cell B-cell lymphoma, unspecified site: Secondary | ICD-10-CM

## 2024-02-28 DIAGNOSIS — Z7969 Long term (current) use of other immunomodulators and immunosuppressants: Secondary | ICD-10-CM | POA: Diagnosis not present

## 2024-02-28 DIAGNOSIS — Z7951 Long term (current) use of inhaled steroids: Secondary | ICD-10-CM | POA: Diagnosis not present

## 2024-02-28 DIAGNOSIS — Z7982 Long term (current) use of aspirin: Secondary | ICD-10-CM | POA: Diagnosis not present

## 2024-02-28 LAB — CBC WITH DIFFERENTIAL (CANCER CENTER ONLY)
Abs Immature Granulocytes: 0.01 10*3/uL (ref 0.00–0.07)
Basophils Absolute: 0 10*3/uL (ref 0.0–0.1)
Basophils Relative: 1 %
Eosinophils Absolute: 0.1 10*3/uL (ref 0.0–0.5)
Eosinophils Relative: 1 %
HCT: 35.9 % — ABNORMAL LOW (ref 36.0–46.0)
Hemoglobin: 11.8 g/dL — ABNORMAL LOW (ref 12.0–15.0)
Immature Granulocytes: 0 %
Lymphocytes Relative: 15 %
Lymphs Abs: 0.9 10*3/uL (ref 0.7–4.0)
MCH: 27.8 pg (ref 26.0–34.0)
MCHC: 32.9 g/dL (ref 30.0–36.0)
MCV: 84.5 fL (ref 80.0–100.0)
Monocytes Absolute: 0.6 10*3/uL (ref 0.1–1.0)
Monocytes Relative: 9 %
Neutro Abs: 4.7 10*3/uL (ref 1.7–7.7)
Neutrophils Relative %: 74 %
Platelet Count: 61 10*3/uL — ABNORMAL LOW (ref 150–400)
RBC: 4.25 MIL/uL (ref 3.87–5.11)
RDW: 17.2 % — ABNORMAL HIGH (ref 11.5–15.5)
WBC Count: 6.3 10*3/uL (ref 4.0–10.5)
nRBC: 0 % (ref 0.0–0.2)

## 2024-02-28 LAB — CMP (CANCER CENTER ONLY)
ALT: 26 U/L (ref 0–44)
AST: 16 U/L (ref 15–41)
Albumin: 4.6 g/dL (ref 3.5–5.0)
Alkaline Phosphatase: 82 U/L (ref 38–126)
Anion gap: 7 (ref 5–15)
BUN: 30 mg/dL — ABNORMAL HIGH (ref 8–23)
CO2: 23 mmol/L (ref 22–32)
Calcium: 9.3 mg/dL (ref 8.9–10.3)
Chloride: 104 mmol/L (ref 98–111)
Creatinine: 1 mg/dL (ref 0.44–1.00)
GFR, Estimated: 59 mL/min — ABNORMAL LOW (ref >60)
Glucose, Bld: 115 mg/dL — ABNORMAL HIGH (ref 70–99)
Potassium: 4.9 mmol/L (ref 3.5–5.1)
Sodium: 134 mmol/L — ABNORMAL LOW (ref 135–145)
Total Bilirubin: 0.5 mg/dL (ref 0.0–1.2)
Total Protein: 6.9 g/dL (ref 6.5–8.1)

## 2024-02-28 LAB — URIC ACID: Uric Acid, Serum: 6.7 mg/dL (ref 2.5–7.1)

## 2024-02-28 LAB — IRON AND IRON BINDING CAPACITY (CC-WL,HP ONLY)
Iron: 87 ug/dL (ref 28–170)
Saturation Ratios: 18 % (ref 10.4–31.8)
TIBC: 491 ug/dL — ABNORMAL HIGH (ref 250–450)
UIBC: 404 ug/dL (ref 148–442)

## 2024-02-28 LAB — FERRITIN: Ferritin: 30 ng/mL (ref 11–307)

## 2024-02-28 LAB — LACTATE DEHYDROGENASE: LDH: 120 U/L (ref 98–192)

## 2024-02-28 MED ORDER — ACETAMINOPHEN 325 MG PO TABS
650.0000 mg | ORAL_TABLET | Freq: Once | ORAL | Status: AC
  Administered 2024-02-28: 650 mg via ORAL
  Filled 2024-02-28: qty 2

## 2024-02-28 MED ORDER — DIPHENHYDRAMINE HCL 25 MG PO CAPS
25.0000 mg | ORAL_CAPSULE | Freq: Once | ORAL | Status: AC
  Administered 2024-02-28: 25 mg via ORAL
  Filled 2024-02-28: qty 1

## 2024-02-28 MED ORDER — METHYLPREDNISOLONE SODIUM SUCC 40 MG IJ SOLR
40.0000 mg | Freq: Every day | INTRAMUSCULAR | Status: DC
  Administered 2024-02-28: 40 mg via INTRAVENOUS
  Filled 2024-02-28: qty 1

## 2024-02-28 MED ORDER — IMMUNE GLOBULIN (HUMAN) 10 GM/100ML IV SOLN
400.0000 mg/kg | INTRAVENOUS | Status: DC
  Administered 2024-02-28: 40 g via INTRAVENOUS
  Filled 2024-02-28: qty 400

## 2024-02-28 MED ORDER — DEXTROSE 5 % IV SOLN: INTRAVENOUS | Status: DC

## 2024-02-28 MED ORDER — ONDANSETRON HCL 4 MG/2ML IJ SOLN
4.0000 mg | Freq: Once | INTRAMUSCULAR | Status: AC
  Administered 2024-02-28: 4 mg via INTRAVENOUS
  Filled 2024-02-28: qty 2

## 2024-03-01 ENCOUNTER — Other Ambulatory Visit (HOSPITAL_COMMUNITY): Payer: Self-pay

## 2024-03-01 MED ORDER — ZOSTER VAC RECOMB ADJUVANTED 50 MCG/0.5ML IM SUSR
0.5000 mL | Freq: Once | INTRAMUSCULAR | 0 refills | Status: AC
  Filled 2024-03-01: qty 0.5, 1d supply, fill #0

## 2024-03-02 DIAGNOSIS — H2000 Unspecified acute and subacute iridocyclitis: Secondary | ICD-10-CM | POA: Diagnosis not present

## 2024-03-04 DIAGNOSIS — R002 Palpitations: Secondary | ICD-10-CM | POA: Diagnosis not present

## 2024-03-04 DIAGNOSIS — K7460 Unspecified cirrhosis of liver: Secondary | ICD-10-CM | POA: Diagnosis not present

## 2024-03-04 DIAGNOSIS — Z6836 Body mass index (BMI) 36.0-36.9, adult: Secondary | ICD-10-CM | POA: Diagnosis not present

## 2024-03-04 DIAGNOSIS — I1310 Hypertensive heart and chronic kidney disease without heart failure, with stage 1 through stage 4 chronic kidney disease, or unspecified chronic kidney disease: Secondary | ICD-10-CM | POA: Diagnosis not present

## 2024-03-04 DIAGNOSIS — E782 Mixed hyperlipidemia: Secondary | ICD-10-CM | POA: Diagnosis not present

## 2024-03-04 DIAGNOSIS — N1832 Chronic kidney disease, stage 3b: Secondary | ICD-10-CM | POA: Diagnosis not present

## 2024-03-04 DIAGNOSIS — E119 Type 2 diabetes mellitus without complications: Secondary | ICD-10-CM | POA: Diagnosis not present

## 2024-03-04 DIAGNOSIS — C8300 Small cell B-cell lymphoma, unspecified site: Secondary | ICD-10-CM | POA: Diagnosis not present

## 2024-03-04 DIAGNOSIS — G629 Polyneuropathy, unspecified: Secondary | ICD-10-CM | POA: Diagnosis not present

## 2024-03-04 DIAGNOSIS — F331 Major depressive disorder, recurrent, moderate: Secondary | ICD-10-CM | POA: Diagnosis not present

## 2024-03-04 DIAGNOSIS — K760 Fatty (change of) liver, not elsewhere classified: Secondary | ICD-10-CM | POA: Diagnosis not present

## 2024-03-05 ENCOUNTER — Other Ambulatory Visit: Payer: Self-pay | Admitting: Internal Medicine

## 2024-03-05 ENCOUNTER — Other Ambulatory Visit (HOSPITAL_COMMUNITY): Payer: Self-pay

## 2024-03-05 ENCOUNTER — Other Ambulatory Visit: Payer: Self-pay

## 2024-03-05 MED ORDER — SPIRONOLACTONE 50 MG PO TABS
50.0000 mg | ORAL_TABLET | Freq: Every day | ORAL | 3 refills | Status: DC
  Filled 2024-03-05: qty 90, 90d supply, fill #0

## 2024-03-05 MED ORDER — BUDESONIDE-FORMOTEROL FUMARATE 80-4.5 MCG/ACT IN AERO
2.0000 | INHALATION_SPRAY | Freq: Every day | RESPIRATORY_TRACT | 12 refills | Status: AC
  Filled 2024-03-05: qty 10.2, 30d supply, fill #0
  Filled 2024-04-02: qty 10.2, 30d supply, fill #1
  Filled 2024-05-12: qty 10.2, 30d supply, fill #2
  Filled 2024-07-16: qty 10.2, 30d supply, fill #3
  Filled 2024-08-27: qty 10.2, 30d supply, fill #4

## 2024-03-05 NOTE — Telephone Encounter (Signed)
 Please advise how many refills Sir, seen in January 2025. Thank you.

## 2024-03-05 NOTE — Telephone Encounter (Signed)
 I left Janice [name] a detailed message to please call me back.

## 2024-03-05 NOTE — Telephone Encounter (Signed)
 I refilled it.  She is coming due for an EGD and colonoscopy and I want her to have an office visit to be seen and schedule that as her procedures are at the hospital and she has cirrhosis.  This is not urgent but see if he can find a spot available within the next 6 weeks.  You could always use an 1130 or 350 if needed.

## 2024-03-05 NOTE — Telephone Encounter (Signed)
 Patient needs an appointment

## 2024-03-06 ENCOUNTER — Encounter: Payer: Self-pay | Admitting: Hematology

## 2024-03-06 ENCOUNTER — Other Ambulatory Visit: Payer: Self-pay

## 2024-03-07 ENCOUNTER — Other Ambulatory Visit: Payer: Self-pay | Admitting: Pharmacy Technician

## 2024-03-07 ENCOUNTER — Other Ambulatory Visit: Payer: Self-pay

## 2024-03-07 NOTE — Progress Notes (Signed)
 Specialty Pharmacy Refill Coordination Note  Janice [name] is a 73 y.o. female contacted today regarding refills of specialty medication(s) Venetoclax  (VENCLEXTA )   Patient requested Delivery   Delivery date: 03/12/24   Verified address: [address]  [place]   Medication will be filled on 03/11/24.

## 2024-03-13 ENCOUNTER — Other Ambulatory Visit (HOSPITAL_COMMUNITY): Payer: Self-pay

## 2024-03-14 NOTE — Telephone Encounter (Signed)
 I reached [place]. She said she never got my message. She has been having issues with diarrhea. Her PCP has adjusted her Metformin . I have set her up an appointment for 03/21/2024 to see us .

## 2024-03-19 ENCOUNTER — Other Ambulatory Visit (HOSPITAL_COMMUNITY): Payer: Self-pay

## 2024-03-19 DIAGNOSIS — G4733 Obstructive sleep apnea (adult) (pediatric): Secondary | ICD-10-CM | POA: Diagnosis not present

## 2024-03-20 DIAGNOSIS — G4733 Obstructive sleep apnea (adult) (pediatric): Secondary | ICD-10-CM | POA: Diagnosis not present

## 2024-03-20 DIAGNOSIS — E782 Mixed hyperlipidemia: Secondary | ICD-10-CM | POA: Diagnosis not present

## 2024-03-20 DIAGNOSIS — K760 Fatty (change of) liver, not elsewhere classified: Secondary | ICD-10-CM | POA: Diagnosis not present

## 2024-03-20 DIAGNOSIS — E119 Type 2 diabetes mellitus without complications: Secondary | ICD-10-CM | POA: Diagnosis not present

## 2024-03-20 DIAGNOSIS — N1832 Chronic kidney disease, stage 3b: Secondary | ICD-10-CM | POA: Diagnosis not present

## 2024-03-20 DIAGNOSIS — I129 Hypertensive chronic kidney disease with stage 1 through stage 4 chronic kidney disease, or unspecified chronic kidney disease: Secondary | ICD-10-CM | POA: Diagnosis not present

## 2024-03-20 DIAGNOSIS — Z794 Long term (current) use of insulin: Secondary | ICD-10-CM | POA: Diagnosis not present

## 2024-03-21 ENCOUNTER — Ambulatory Visit (INDEPENDENT_AMBULATORY_CARE_PROVIDER_SITE_OTHER): Admitting: Internal Medicine

## 2024-03-21 ENCOUNTER — Encounter: Payer: Self-pay | Admitting: Internal Medicine

## 2024-03-21 ENCOUNTER — Other Ambulatory Visit (INDEPENDENT_AMBULATORY_CARE_PROVIDER_SITE_OTHER)

## 2024-03-21 VITALS — BP 134/70 | HR 82 | Ht 64.0 in | Wt 219.0 lb

## 2024-03-21 DIAGNOSIS — Z860101 Personal history of adenomatous and serrated colon polyps: Secondary | ICD-10-CM | POA: Diagnosis not present

## 2024-03-21 DIAGNOSIS — K7460 Unspecified cirrhosis of liver: Secondary | ICD-10-CM

## 2024-03-21 DIAGNOSIS — T884XXD Failed or difficult intubation, subsequent encounter: Secondary | ICD-10-CM

## 2024-03-21 DIAGNOSIS — R197 Diarrhea, unspecified: Secondary | ICD-10-CM

## 2024-03-21 DIAGNOSIS — K7581 Nonalcoholic steatohepatitis (NASH): Secondary | ICD-10-CM | POA: Diagnosis not present

## 2024-03-21 DIAGNOSIS — R635 Abnormal weight gain: Secondary | ICD-10-CM | POA: Diagnosis not present

## 2024-03-21 DIAGNOSIS — R161 Splenomegaly, not elsewhere classified: Secondary | ICD-10-CM | POA: Diagnosis not present

## 2024-03-21 DIAGNOSIS — I8510 Secondary esophageal varices without bleeding: Secondary | ICD-10-CM | POA: Diagnosis not present

## 2024-03-21 LAB — PROTIME-INR
INR: 1.2 ratio — ABNORMAL HIGH (ref 0.8–1.0)
Prothrombin Time: 12.2 s (ref 9.6–13.1)

## 2024-03-21 NOTE — Patient Instructions (Signed)
 Your provider has requested that you go to the basement level for lab work before leaving today. Press B on the elevator. The lab is located at the first door on the left as you exit the elevator.  Due to recent changes in healthcare laws, you may see the results of your imaging and laboratory studies on MyChart before your provider has had a chance to review them.  We understand that in some cases there may be results that are confusing or concerning to you. Not all laboratory results come back in the same time frame and the provider may be waiting for multiple results in order to interpret others.  Please give us  48 hours in order for your provider to thoroughly review all the results before contacting the office for clarification of your results.   You have been scheduled for an abdominal ultrasound at [facility] (1st floor of hospital) on 03/25/2024 at 7:00AM. Please arrive 15 minutes prior to your appointment for registration. Make certain not to have anything to eat or drink 6 hours prior to your appointment. Should you need to reschedule your appointment, please contact radiology at [phone]. This test typically takes about 30 minutes to perform.  I appreciate the opportunity to care for you. Lupita Commander, MD, [facility]

## 2024-03-21 NOTE — Progress Notes (Signed)
 Janice Janice [name] 73 y.o. 1951-06-12 [id number]  Assessment & Plan:   Encounter Diagnoses  Name Primary?   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 Difficile.  Dr. Onesimo does not think venetoclax  is causing diarrhea.  She held that briefly and it did not sound like it made Janice [name] difference.  I do not have experience with this medication though diarrhea is listed as Janice [name] possible side effect.  She did get some benefit with stopping metformin  but still having watery diarrhea.   Check INR and alpha-fetoprotein today  Eventual EGD to follow-up varices plus or minus colonoscopy.  She is not inclined to pursue colonoscopy.  It could be needed to evaluate diarrhea further.  It is indicated for polyp surveillance.  Procedures need to be at the hospital due to history of difficult airway.  Do not know if she has ascites at this point, so difficult to calculate Child-Pugh score though I think she could be Janice [name] Janice Janice [name] if we go ahead and indicate slight ascites.  If no ascites, definitely Janice Janice [name]  CC: Avva, Ravisankar, MD   Subjective:  Gastroenterology summary:   MASH (NASH) cirrhosis diagnosed 2021 trace ascites at the time no encephalopathy no varices last EGD 2024-hyperplastic polypoid changes in the antrum as before and grade 1 varices plus portal gastropathy.  On low-dose spironolactone .  She is not immune to hepatitis Janice [name] or B and has not been vaccinated   Husband died from liver failure due to NASH   Swelling in fluid retention postop after feb 24 hernia repair treated with increase diuretics now  back to prior dose US  stable liver cirrhsosis and tr ascites no lesion  CT scanning 08/22/2023 stable cirrhosis no liver lesions   Question of liver lesions-last MRI 02/07/2022 not seen, does have splenomegaly with hypoenhancing splenic lesions stable history of lymphoma with multiple lymph nodes noted on MRI.  PET scan October 2023 without liver lesions   History of adenomatous colonic polyps 2012-2 cm pedunculated tubulovillous adenoma removed, complicated by post polypectomy bleed clipped 01/29/2021 08/01/2016 3 adenomas max 6 mm 06/07/2018 no polyps (iron  deficiency anemia evaluation). Does have diverticulosis   History of ulcerated hyperplastic gastric polyp snared 2019 Iron  deficiency anemia related to this polyp presumably   Umbilical hernia-Dr. Stevie repaired  10/20/2022, open method   Recurrent diverticulitis-last episode 08/22/2023 confirmed by CT  ------------------------------------------------------------------------------------------------------------  Patient consented to the use of artificial intelligence scribe application  Chief Complaint: Follow-up of cirrhosis, having diarrhea  HPI 73 year old woman followed here for M ASH cirrhosis as well as history of colon polyps.  Problems as outlined by the summary above.  She returns today complaining of diarrhea which is Janice [name] new complaint to me.  It started to occur earlier this year and was thought possibly to be due to metformin  which has been discontinued with some improvement though not resolution and also venetoclax  was considered.  She is on that for her CLL and it is effective.  She held that briefly and there was really not much change in the diarrhea.  Dr. Onesimo thought that that was  unlikely to be the culprit.  She has not had stool testing.  There have been rare occasions of formed stools but its mostly all watery diarrhea and was very severe in May.  She describes as 'blow out loose' with little warning, primarily  occurring several times in the morning. Despite some improvement after stopping metformin , she still experiences diarrhea multiple times Janice [name] day, recently reduced from ten to about five episodes daily. The diarrhea occasionally wakes her at night, and she has experienced dry heaves concurrently. She rarely has formed bowel movements and has noticed some bleeding, which she attributes to the frequency of diarrhea. She avoids taking Imodium unless necessary due to past constipation issues.  No rectal bleeding unless she has had multiple multiple stools and has some slight bright red blood on the toilet paper.  She recalls Janice [name] significant weight gain from 206 to 224 pounds over Janice [name] short period, affecting her breathing. She reported increasing her sodium intake after being advised to consume more salt due to low sodium levels. She has since started monitoring her sodium intake more closely.  She has Janice [name] history of dizziness and rapid heartbeat, which led to her cardiologist discontinuing her blood pressure medication due to consistently low blood pressure. She also had furosemide  changed to Janice [name] as needed medication but subsequently she has resumed that regularly.  She has not noted significant pedal edema.  She is drinking about 64 ounces of liquid Janice [name] day. Wt Readings from Last 3 Encounters:  03/21/24 219 lb (99.3 kg)  02/28/24 217 lb (98.4 kg)  01/29/24 213 lb 8 oz (96.8 kg)      Latest Ref Rng & Units 02/28/2024   11:25 AM 01/29/2024   12:14 PM 01/16/2024   12:07 PM  CBC  WBC 4.0 - 10.5 K/uL 6.3  5.1  4.5   Hemoglobin 12.0 - 15.0 g/dL 88.1  87.5  88.0   Hematocrit 36.0 - 46.0 % 35.9  38.7  37.1   Platelets 150 - 400 K/uL 61  60  54    Lab Results  Component Value Date   ALT 26 02/28/2024   AST 16 02/28/2024   ALKPHOS 82 02/28/2024   BILITOT 0.5 02/28/2024     Allergies  Allergen Reactions   Breo Ellipta [Fluticasone  Furoate-Vilanterol] Anaphylaxis and Hives    Other reaction(s): hives  breo  ellipta   Gazyva  [Obinutuzumab ] Shortness Of Breath    04/27/23- SOB, Severe back pain/neck pain, HA, flushing and some diaphoresis; see progress note   Iodine  Hives, Swelling and Other (See Comments)    And Betadine /caused hives and swelling   Latex Hives, Itching, Rash and Other (See Comments)    Burning   Tessalon  [Benzonatate ] Anaphylaxis, Hives and Other (See Comments)   Zocor [Simvastatin] Other (See Comments)    Caused neuropathy in arms Neuropathy in arms   Levaquin [Levofloxacin] Other (See Comments)    Joint aches//leg, shoulder pain Joint pain   Penicillins Hives   Povidone-Iodine  Hives   Wellbutrin [Bupropion] Other (See Comments)    Negative thoughts. Crazy thoughts   Amoxicillin Hives    Blisters Did it involve swelling of the face/tongue/throat, SOB, or low BP? Yes Did it involve sudden or severe rash/hives, skin peeling, or any reaction on the inside of your mouth or nose? Yes Did you need to seek medical attention at Janice [name] hospital or doctor's office?Unknown When did it last happen? 2019      If all above answers are "NO", may proceed with cephalosporin  use.    Chlorhexidine  Itching     Chlorhexidine  Gluconate (CHG) Cloths/topical solution   Diflucan [Fluconazole] Hives and Other (See Comments)    blister   Thalitone  [Chlorthalidone ] Other (See Comments)    Ears ringing    Amoxicillin-Pot Clavulanate Rash   Vibra-Tab [Doxycycline] Diarrhea and Itching   Current Meds  Medication Sig   albuterol  (VENTOLIN  HFA) 108 (90 Base) MCG/ACT inhaler Inhale 1-2 puffs into the lungs every 6 (six) hours as needed (Cough).   aspirin  EC 81 MG tablet Take 1 tablet (81 mg total) by mouth daily. Swallow whole.   azelastine  (ASTELIN ) 0.1 % nasal spray Place 1 spray into both nostrils 2 (two) times daily. Use in each nostril as directed   b complex vitamins capsule Take 1 capsule by mouth in the morning.   bacitracin  500 UNIT/GM ointment Apply topically 2 (two) times daily for 10  days   budesonide -formoterol  (SYMBICORT ) 80-4.5 MCG/ACT inhaler Inhale 2 puffs into the lungs daily.   Carboxymethylcellul-Glycerin (REFRESH OPTIVE PF OP) Place 1 drop into both eyes 3 (three) times daily as needed (dry/irritated eyes.). Non-pres   cephALEXin  (KEFLEX ) 500 MG capsule Take 2 capsules (1,000 mg total) by mouth as directed 1 hour prior to dental work.   Coenzyme Q10 (CO Q-10) 100 MG CAPS Take 100 mg by mouth in the morning.   Continuous Glucose Sensor (FREESTYLE LIBRE 2 SENSOR) MISC Replace every 14 days and use to check blood sugar continuously.   Continuous Glucose Sensor (FREESTYLE LIBRE 2 SENSOR) MISC Replace every 14 days and use to check blood sugar continuously.   desoximetasone  (TOPICORT ) 0.25 % cream Apply Janice [name] small amount to skin twice Janice [name] day as needed for flares   diclofenac  Sodium (VOLTAREN  ARTHRITIS PAIN) 1 % GEL Apply topically 4 (four) times daily to bilateral knees and ankles   diclofenac  sodium (VOLTAREN ) 1 % GEL Apply 2 g topically 2 (two) times daily as needed (knee pain/ foot /Triger finger).   empagliflozin  (JARDIANCE ) 10 MG TABS tablet Take 1 tablet (10 mg total) by mouth daily.   fluticasone  (FLONASE ) 50 MCG/ACT nasal spray Place 2 sprays into both nostrils in the morning.   furosemide  (LASIX ) 20 MG tablet Take 1 tablet (20 mg total) by mouth as needed. Take if you gain 1 lb in 24 hours, or 3 lbs in 1 week. Take it daily and stop when weight back to baseline.   gabapentin  (NEURONTIN ) 300 MG capsule Take 2 capsules (600 mg total) by mouth at bedtime.   glucose blood (ONETOUCH VERIO) test strip Use as directed to check blood sugar 3 times Janice [name] day.   glucose blood test strip Use to test blood sugar 3 times daily as directed   hydrocortisone  (ANUSOL -HC) 25 MG suppository Place 1 suppository (25 mg total) rectally daily.   influenza vaccine adjuvanted (FLUAD ) 0.5 ML injection Inject into the muscle.   insulin  aspart (NOVOLOG  FLEXPEN) 100 UNIT/ML FlexPen Inject 30 Units  into the skin daily with breakfast AND 30 Units daily with lunch AND 30 Units daily with supper.   insulin  aspart (NOVOLOG  FLEXPEN) 100 UNIT/ML FlexPen Inject 30 Units into the skin daily with breakfast AND 30 Units daily with lunch AND 30 Units daily with supper.   insulin  degludec (TRESIBA  FLEXTOUCH) 200 UNIT/ML FlexTouch Pen Inject 80 Units into the skin daily.   Insulin  Pen Needle 32G X 4 MM MISC Use as directed 5 (five) times daily.   irbesartan  (AVAPRO ) 300 MG tablet Take 1 tablet (300 mg  total) by mouth every evening.   irbesartan  (AVAPRO ) 300 MG tablet Take 1 tablet (300 mg total) by mouth in the morning.   Lancets (ONETOUCH DELICA PLUS LANCET33G) MISC Use to test blood sugar 3 times daily   omeprazole  (PRILOSEC) 40 MG capsule Take 1 capsule (40 mg total) by mouth 2 (two) times daily.   ondansetron  (ZOFRAN -ODT) 4 MG disintegrating tablet Dissolve 1 tablet (4 mg) by mouth every 8 hours as needed for nausea or vomiting.   prednisoLONE  acetate (PRED FORTE ) 1 % ophthalmic suspension Place 1 drop into both eyes 4 (four) times daily.   prednisoLONE  acetate (PREDNISOLONE  ACETATE P-F) 1 % ophthalmic suspension Place 1 drop into both eyes 4 (four) times daily.   rosuvastatin  (CRESTOR ) 10 MG tablet Take 1 tablet (10 mg total) by mouth daily.   sertraline  (ZOLOFT ) 50 MG tablet Take 1 tablet (50 mg total) by mouth daily.   spironolactone  (ALDACTONE ) 50 MG tablet Take 1 tablet (50 mg total) by mouth daily.   terconazole (TERAZOL 3) 0.8 % vaginal cream Place 1 applicator vaginally daily as needed (yeast infections).   venetoclax  (VENCLEXTA ) 100 MG tablet Take 1 tablet (100 mg total) by mouth daily. Tablets should be swallowed whole with Janice [name] meal and Janice [name] full glass of water .   Zoster Vaccine Adjuvanted (SHINGRIX ) injection Inject into the muscle.   Past Medical History:  Diagnosis Date   Allergy    Asthma    seasonal   Cellulitis 2013   Left toe   Chronic kidney disease    stage 1   Cirrhosis (HCC)     Common migraine    History of   Complication of anesthesia    Per pt/had breathing problems with block during rotator cuff surgery. 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   Coronary artery disease    DDD (degenerative disc disease)    Depression    denies takes paxil for migraines   Diabetes mellitus    type 2   Diabetic peripheral neuropathy (HCC)    Difficult airway for intubation 10/20/2022   DLx 1 with MAC 4, unable to see cords, DL with lopro S3 glidescope, easy atraumatic intubation   Difficult intubation 03/03/2023   Diverticulitis    Diverticulosis    DJD (degenerative joint disease)    Esophageal varices (HCC)    GERD (gastroesophageal reflux disease)    History of colon polyps    hyperplastic   History of gastric polyp    Hyperlipidemia    Hypertension    Iron  deficiency anemia    Obesity    OSA on CPAP    cpap   Peripheral neuropathy    Pneumonia    april 2020  mild   Primary localized osteoarthritis of left knee 03/27/2019   Sensorineural hearing loss    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HCC)    Past Surgical History:  Procedure Laterality Date   ANKLE SURGERY     Left    BREAST BIOPSY Left 2021   left axilla, lymphoma   COLONOSCOPY  05/2018   ESOPHAGOGASTRODUODENOSCOPY (EGD) WITH PROPOFOL  N/Janice [name] 03/09/2023   Procedure: ESOPHAGOGASTRODUODENOSCOPY (EGD) WITH PROPOFOL ;  Surgeon: Avram Lupita BRAVO, MD;  Location: [facility];  Service: Gastroenterology;  Laterality: N/Janice [name];   LEEP N/Janice [name] 09/14/2018   Procedure: LOOP ELECTROSURGICAL EXCISION PROCEDURE (LEEP);  Surgeon: Leva Rush, MD;  Location: [facility];  Service: Gynecology;  Laterality: N/Janice [name];   MASS EXCISION Right 06/24/2021   Procedure: EXCISION RIGHT POSTERIOR NECK MASS;  Surgeon: Stevie Primes  Beverley, MD;  Location: [facility];  Service: General;  Laterality: Right;   ROTATOR CUFF REPAIR Bilateral 2012, 2015   TONSILLECTOMY     TOTAL KNEE ARTHROPLASTY Left 04/08/2019   Procedure: TOTAL KNEE  ARTHROPLASTY;  Surgeon: Jane Charleston, MD;  Location: [facility];  Service: Orthopedics;  Laterality: Left;   UMBILICAL HERNIA REPAIR N/Janice [name] 10/20/2022   Procedure: OPEN HERNIA REPAIR UMBILICAL ADULT with Mesh;  Surgeon: Kinsinger, Herlene Beverley, MD;  Location: [facility];  Service: General;  Laterality: N/Janice [name];   UPPER GI ENDOSCOPY  05/2018   Social History   Social History Narrative   1 caffeine drink daily    Right-handed   widow (husband died from [place] cirrhosis)   family history includes Bone cancer in her mother; Breast cancer in her mother; Diabetes Mellitus II in her brother, father, and sister; Heart failure in her father; Hypertension in her brother and father; Obesity in her sister; Other in her sister.   Review of Systems As per HPI  Objective:   Physical Exam @BP  134/70   Pulse 82   Ht 5' 4 (1.626 m)   Wt 219 lb (99.3 kg)   BMI 37.59 kg/m @  General:  NAD Eyes:   anicteric Lungs:  clear Heart::  S1S2 no rubs, murmurs or gallops Abdomen:  soft and nontender, BS+ obese - no obvious ascites Ext:   no edema, cyanosis or clubbing Janice [name] and O x 3 + no asterixis   Data Reviewed:  See HPI but I have reviewed hematology oncology notes, cardiology notes labs and imaging in the chart.  I spent more than 40 minutes of time, including in depth chart review, independent review of results as outlined above, communicating results with the patient directly, face-to-face time with the patient, coordinating care, ordering studies and medications as appropriate, and documentation.

## 2024-03-22 ENCOUNTER — Ambulatory Visit: Payer: Self-pay | Admitting: Internal Medicine

## 2024-03-22 ENCOUNTER — Other Ambulatory Visit

## 2024-03-22 ENCOUNTER — Encounter: Payer: Self-pay | Admitting: Hematology

## 2024-03-22 DIAGNOSIS — K7581 Nonalcoholic steatohepatitis (NASH): Secondary | ICD-10-CM | POA: Diagnosis not present

## 2024-03-22 DIAGNOSIS — K7460 Unspecified cirrhosis of liver: Secondary | ICD-10-CM

## 2024-03-22 DIAGNOSIS — R197 Diarrhea, unspecified: Secondary | ICD-10-CM

## 2024-03-22 LAB — AFP TUMOR MARKER: AFP-Tumor Marker: 3.8 ng/mL

## 2024-03-25 ENCOUNTER — Ambulatory Visit (HOSPITAL_COMMUNITY)
Admission: RE | Admit: 2024-03-25 | Discharge: 2024-03-25 | Disposition: A | Discharge status: Home / Self Care | Source: Ambulatory Visit | Attending: Internal Medicine | Admitting: Internal Medicine

## 2024-03-25 DIAGNOSIS — R188 Other ascites: Secondary | ICD-10-CM | POA: Diagnosis not present

## 2024-03-25 DIAGNOSIS — R161 Splenomegaly, not elsewhere classified: Secondary | ICD-10-CM | POA: Insufficient documentation

## 2024-03-25 DIAGNOSIS — K769 Liver disease, unspecified: Secondary | ICD-10-CM | POA: Diagnosis not present

## 2024-03-25 DIAGNOSIS — K7581 Nonalcoholic steatohepatitis (NASH): Secondary | ICD-10-CM | POA: Diagnosis not present

## 2024-03-25 DIAGNOSIS — K824 Cholesterolosis of gallbladder: Secondary | ICD-10-CM | POA: Diagnosis not present

## 2024-03-25 DIAGNOSIS — K7689 Other specified diseases of liver: Secondary | ICD-10-CM | POA: Diagnosis not present

## 2024-03-25 DIAGNOSIS — K7460 Unspecified cirrhosis of liver: Secondary | ICD-10-CM | POA: Diagnosis not present

## 2024-03-25 LAB — CLOSTRIDIUM DIFFICILE TOXIN B, QUALITATIVE, REAL-TIME PCR: Toxigenic C. Difficile by PCR: NOT DETECTED

## 2024-03-26 ENCOUNTER — Encounter (HOSPITAL_COMMUNITY): Payer: PPO

## 2024-03-26 ENCOUNTER — Other Ambulatory Visit: Payer: HMO

## 2024-03-26 LAB — GI PROFILE, STOOL, PCR

## 2024-03-26 LAB — CALPROTECTIN, FECAL: Calprotectin, Fecal: 64 ug/g (ref 0–120)

## 2024-03-27 ENCOUNTER — Other Ambulatory Visit: Payer: Self-pay

## 2024-03-27 ENCOUNTER — Other Ambulatory Visit: Payer: Self-pay | Admitting: Internal Medicine

## 2024-03-27 ENCOUNTER — Telehealth: Payer: Self-pay | Admitting: Internal Medicine

## 2024-03-27 ENCOUNTER — Other Ambulatory Visit (HOSPITAL_COMMUNITY): Payer: Self-pay

## 2024-03-27 DIAGNOSIS — I8510 Secondary esophageal varices without bleeding: Secondary | ICD-10-CM

## 2024-03-27 DIAGNOSIS — R932 Abnormal findings on diagnostic imaging of liver and biliary tract: Secondary | ICD-10-CM

## 2024-03-27 DIAGNOSIS — K7460 Unspecified cirrhosis of liver: Secondary | ICD-10-CM

## 2024-03-27 MED ORDER — LORAZEPAM 0.5 MG PO TABS
ORAL_TABLET | ORAL | 0 refills | Status: AC
  Filled 2024-03-27: qty 2, 1d supply, fill #0

## 2024-03-27 NOTE — Telephone Encounter (Signed)
 MRI ordered. Message sent to schedulers. EGD case added to 05/16/2024 schedule at [facility] for Dr. Avram. Arrival time of 10:15 with planned start time of 1145. Case ID [id number]. Patient notified.

## 2024-03-27 NOTE — Telephone Encounter (Signed)
 Spoke to the patient today about several results  #1 stool studies do not show infection and calprotectin was borderline she says diarrhea is getting somewhat better  #2 ultrasound shows only trace ascites, she has an enlarged spleen, and she has possible gallbladder polyps   #3 she declined to schedule a colonoscopy she understands there is a risk of undetected polyps or cancer and that it might help understand diarrhea better   She needs the following:  #1 schedule MRI of the liver with and without contrast diagnosis is abnormal gallbladder on ultrasound possible polyps, also use cirrhosis diagnosis  #2 I have prescribed lorazepam  0.5 mg take 1 or 2 once before MRI #2 no refills  #3 schedule EGD at the hospital because of esophageal varices and cirrhosis has to be done at hospital due to difficult airway  Encounter Diagnoses  Name Primary?   Abnormal gallbladder ultrasound Yes   Liver cirrhosis secondary to NASH (nonalcoholic steatohepatitis) (HCC)    Secondary esophageal varices without bleeding (HCC)

## 2024-03-28 ENCOUNTER — Other Ambulatory Visit: Payer: Self-pay

## 2024-03-28 DIAGNOSIS — C9110 Chronic lymphocytic leukemia of B-cell type not having achieved remission: Secondary | ICD-10-CM

## 2024-03-28 DIAGNOSIS — D801 Nonfamilial hypogammaglobulinemia: Secondary | ICD-10-CM

## 2024-03-29 ENCOUNTER — Inpatient Hospital Stay: Attending: Hematology

## 2024-03-29 ENCOUNTER — Inpatient Hospital Stay

## 2024-03-29 VITALS — BP 128/50 | HR 85 | Temp 98.0°F | Resp 18 | Ht 64.0 in | Wt 216.2 lb

## 2024-03-29 DIAGNOSIS — R590 Localized enlarged lymph nodes: Secondary | ICD-10-CM | POA: Diagnosis not present

## 2024-03-29 DIAGNOSIS — Z79899 Other long term (current) drug therapy: Secondary | ICD-10-CM | POA: Diagnosis not present

## 2024-03-29 DIAGNOSIS — R162 Hepatomegaly with splenomegaly, not elsewhere classified: Secondary | ICD-10-CM | POA: Diagnosis not present

## 2024-03-29 DIAGNOSIS — Z7982 Long term (current) use of aspirin: Secondary | ICD-10-CM | POA: Diagnosis not present

## 2024-03-29 DIAGNOSIS — Z7951 Long term (current) use of inhaled steroids: Secondary | ICD-10-CM | POA: Diagnosis not present

## 2024-03-29 DIAGNOSIS — C9110 Chronic lymphocytic leukemia of B-cell type not having achieved remission: Secondary | ICD-10-CM

## 2024-03-29 DIAGNOSIS — D801 Nonfamilial hypogammaglobulinemia: Secondary | ICD-10-CM | POA: Insufficient documentation

## 2024-03-29 DIAGNOSIS — R188 Other ascites: Secondary | ICD-10-CM | POA: Insufficient documentation

## 2024-03-29 DIAGNOSIS — K7460 Unspecified cirrhosis of liver: Secondary | ICD-10-CM | POA: Insufficient documentation

## 2024-03-29 DIAGNOSIS — D509 Iron deficiency anemia, unspecified: Secondary | ICD-10-CM | POA: Diagnosis not present

## 2024-03-29 DIAGNOSIS — R140 Abdominal distension (gaseous): Secondary | ICD-10-CM | POA: Diagnosis not present

## 2024-03-29 DIAGNOSIS — D696 Thrombocytopenia, unspecified: Secondary | ICD-10-CM | POA: Insufficient documentation

## 2024-03-29 DIAGNOSIS — Z7969 Long term (current) use of other immunomodulators and immunosuppressants: Secondary | ICD-10-CM | POA: Diagnosis not present

## 2024-03-29 DIAGNOSIS — C8300 Small cell B-cell lymphoma, unspecified site: Secondary | ICD-10-CM

## 2024-03-29 DIAGNOSIS — R739 Hyperglycemia, unspecified: Secondary | ICD-10-CM | POA: Insufficient documentation

## 2024-03-29 LAB — CBC WITH DIFFERENTIAL (CANCER CENTER ONLY)
Abs Immature Granulocytes: 0.02 K/uL (ref 0.00–0.07)
Basophils Absolute: 0 K/uL (ref 0.0–0.1)
Basophils Relative: 1 %
Eosinophils Absolute: 0.1 K/uL (ref 0.0–0.5)
Eosinophils Relative: 1 %
HCT: 41.2 % (ref 36.0–46.0)
Hemoglobin: 13.5 g/dL (ref 12.0–15.0)
Immature Granulocytes: 0 %
Lymphocytes Relative: 12 %
Lymphs Abs: 0.8 K/uL (ref 0.7–4.0)
MCH: 26.6 pg (ref 26.0–34.0)
MCHC: 32.8 g/dL (ref 30.0–36.0)
MCV: 81.3 fL (ref 80.0–100.0)
Monocytes Absolute: 0.8 K/uL (ref 0.1–1.0)
Monocytes Relative: 11 %
Neutro Abs: 5.3 K/uL (ref 1.7–7.7)
Neutrophils Relative %: 75 %
Platelet Count: 68 K/uL — ABNORMAL LOW (ref 150–400)
RBC: 5.07 MIL/uL (ref 3.87–5.11)
RDW: 14.7 % (ref 11.5–15.5)
WBC Count: 7.1 K/uL (ref 4.0–10.5)
nRBC: 0 % (ref 0.0–0.2)

## 2024-03-29 LAB — CMP (CANCER CENTER ONLY)
ALT: 27 U/L (ref 0–44)
AST: 20 U/L (ref 15–41)
Albumin: 4.7 g/dL (ref 3.5–5.0)
Alkaline Phosphatase: 98 U/L (ref 38–126)
Anion gap: 7 (ref 5–15)
BUN: 36 mg/dL — ABNORMAL HIGH (ref 8–23)
CO2: 24 mmol/L (ref 22–32)
Calcium: 9.5 mg/dL (ref 8.9–10.3)
Chloride: 100 mmol/L (ref 98–111)
Creatinine: 1.21 mg/dL — ABNORMAL HIGH (ref 0.44–1.00)
GFR, Estimated: 47 mL/min — ABNORMAL LOW (ref >60)
Glucose, Bld: 242 mg/dL — ABNORMAL HIGH (ref 70–99)
Potassium: 5.4 mmol/L — ABNORMAL HIGH (ref 3.5–5.1)
Sodium: 131 mmol/L — ABNORMAL LOW (ref 135–145)
Total Bilirubin: 0.6 mg/dL (ref 0.0–1.2)
Total Protein: 7.4 g/dL (ref 6.5–8.1)

## 2024-03-29 LAB — IRON AND IRON BINDING CAPACITY (CC-WL,HP ONLY)
Iron: 87 ug/dL (ref 28–170)
Saturation Ratios: 16 % (ref 10.4–31.8)
TIBC: 545 ug/dL — ABNORMAL HIGH (ref 250–450)
UIBC: 458 ug/dL — ABNORMAL HIGH (ref 148–442)

## 2024-03-29 LAB — URIC ACID: Uric Acid, Serum: 6.7 mg/dL (ref 2.5–7.1)

## 2024-03-29 LAB — LACTATE DEHYDROGENASE: LDH: 144 U/L (ref 98–192)

## 2024-03-29 LAB — FERRITIN: Ferritin: 34 ng/mL (ref 11–307)

## 2024-03-29 MED ORDER — DEXTROSE 5 % IV SOLN: INTRAVENOUS | Status: DC

## 2024-03-29 MED ORDER — ACETAMINOPHEN 325 MG PO TABS
650.0000 mg | ORAL_TABLET | Freq: Once | ORAL | Status: AC
Start: 2024-03-29 — End: 2024-03-29
  Administered 2024-03-29: 650 mg via ORAL
  Filled 2024-03-29: qty 2

## 2024-03-29 MED ORDER — IMMUNE GLOBULIN (HUMAN) 10 GM/100ML IV SOLN
400.0000 mg/kg | INTRAVENOUS | Status: DC
  Administered 2024-03-29: 40 g via INTRAVENOUS
  Filled 2024-03-29: qty 400

## 2024-03-29 MED ORDER — DIPHENHYDRAMINE HCL 25 MG PO CAPS
25.0000 mg | ORAL_CAPSULE | Freq: Once | ORAL | Status: AC
  Administered 2024-03-29: 25 mg via ORAL
  Filled 2024-03-29: qty 1

## 2024-03-29 MED ORDER — METHYLPREDNISOLONE SODIUM SUCC 40 MG IJ SOLR
40.0000 mg | Freq: Once | INTRAMUSCULAR | Status: AC
  Administered 2024-03-29: 40 mg via INTRAVENOUS
  Filled 2024-03-29: qty 1

## 2024-03-29 MED ORDER — ONDANSETRON HCL 4 MG/2ML IJ SOLN
4.0000 mg | Freq: Once | INTRAMUSCULAR | Status: AC
  Administered 2024-03-29: 4 mg via INTRAVENOUS
  Filled 2024-03-29: qty 2

## 2024-03-29 NOTE — Progress Notes (Signed)
 Pt. declines to stay for 30 minute post observation. Vital signs stable, left via ambulation, and no respiratory distress noted.

## 2024-03-29 NOTE — Patient Instructions (Signed)
 Immune Globulin Injection What is this medication? IMMUNE GLOBULIN (im MUNE GLOB yoo lin) treats many immune system conditions. It works by Designer, multimedia extra antibodies. Antibodies are proteins made by the immune system that help protect the body. This medicine may be used for other purposes; ask your health care provider or pharmacist if you have questions. COMMON BRAND NAME(S): ASCENIV, Baygam, BIVIGAM, Carimune, Carimune NF, cutaquig, Cuvitru, Flebogamma, Flebogamma DIF, GamaSTAN, GamaSTAN S/D, Gamimune N, Gammagard, Gammagard S/D, Gammaked, Gammaplex, Gammar-P IV, Gamunex, Gamunex-C, Hizentra, Iveegam, Iveegam EN, Octagam, Panglobulin, Panglobulin NF, panzyga, Polygam S/D, Privigen, Sandoglobulin, Venoglobulin-S, Vigam, Vivaglobulin, Xembify What should I tell my care team before I take this medication? They need to know if you have any of these conditions: Blood clotting disorder Condition where you have excess fluid in your body, such as heart failure or edema Dehydration Diabetes Have had blood clots Heart disease Immune system conditions Kidney disease Low levels of IgA Recent or upcoming vaccine An unusual or allergic reaction to immune globulin, other medications, foods, dyes, or preservatives Pregnant or trying to get pregnant Breastfeeding How should I use this medication? This medication is infused into a vein or under the skin. It is usually given by your care team in a hospital or clinic setting. It may also be given at home. If you get this medication at home, you will be taught how to prepare and give it. Use exactly as directed. Take it as directed on the prescription label at the same time every day. Keep taking it unless your care team tells you to stop. It is important that you put your used needles and syringes in a special sharps container. Do not put them in a trash can. If you do not have a sharps container, call your pharmacist or care team to get one. Talk to  your care team about the use of this medication in children. While it may be given to children for selected conditions, precautions do apply. Overdosage: If you think you have taken too much of this medicine contact a poison control center or emergency room at once. NOTE: This medicine is only for you. Do not share this medicine with others. What if I miss a dose? If you get this medication at the hospital or clinic: It is important not to miss your dose. Call your care team if you are unable to keep an appointment. If you give yourself this medication at home: If you miss a dose, take it as soon as you can. Then continue your normal schedule. If it is almost time for your next dose, take only that dose. Do not take double or extra doses. Call your care team with questions. What may interact with this medication? Live virus vaccines This list may not describe all possible interactions. Give your health care provider a list of all the medicines, herbs, non-prescription drugs, or dietary supplements you use. Also tell them if you smoke, drink alcohol, or use illegal drugs. Some items may interact with your medicine. What should I watch for while using this medication? Your condition will be monitored carefully while you are receiving this medication. Tell your care team if your symptoms do not start to get better or if they get worse. You may need blood work done while you are taking this medication. This medication increases the risk of blood clots. People with heart, blood vessel, or blood clotting conditions are more likely to develop a blood clot. Other risk factors include advanced age, estrogen  use, tobacco use, lack of movement, and being overweight. This medication can decrease the response to a vaccine. If you need to get vaccinated, tell your care team if you have received this medication within the last year. Extra booster doses may be needed. Talk to your care team to see if a different  vaccination schedule is needed. If you have diabetes, you may get a falsely elevated blood sugar reading. Talk to your care team about how to check your blood sugar while taking this medication. What side effects may I notice from receiving this medication? Side effects that you should report to your care team as soon as possible: Allergic reactions--skin rash, itching, hives, swelling of the face, lips, tongue, or throat Blood clot--pain, swelling, or warmth in the leg, shortness of breath, chest pain Fever, neck pain or stiffness, sensitivity to light, headache, nausea, vomiting, confusion, which may be signs of meningitis Hemolytic anemia--unusual weakness or fatigue, dizziness, headache, trouble breathing, dark urine, yellowing skin or eyes Kidney injury--decrease in the amount of urine, swelling of the ankles, hands, or feet Low sodium level--muscle weakness, fatigue, dizziness, headache, confusion Shortness of breath or trouble breathing, cough, unusual weakness or fatigue, blue skin or lips Side effects that usually do not require medical attention (report these to your care team if they continue or are bothersome): Chills Diarrhea Fever Headache Nausea This list may not describe all possible side effects. Call your doctor for medical advice about side effects. You may report side effects to FDA at 1-800-FDA-1088. Where should I keep my medication? Keep out of the reach of children and pets. You will be instructed on how to store this medication. Get rid of any unused medication after the expiration date. To get rid of medications that are no longer needed or have expired: Take the medication to a medication take-back program. Check with your pharmacy or law enforcement to find a location. If you cannot return the medication, ask your pharmacist or care team how to get rid of this medication safely. NOTE: This sheet is a summary. It may not cover all possible information. If you have  questions about this medicine, talk to your doctor, pharmacist, or health care provider.  2024 Elsevier/Gold Standard (2023-08-11 00:00:00)

## 2024-03-30 ENCOUNTER — Encounter (HOSPITAL_COMMUNITY): Payer: Self-pay

## 2024-03-30 ENCOUNTER — Other Ambulatory Visit (HOSPITAL_COMMUNITY): Payer: Self-pay

## 2024-03-30 ENCOUNTER — Ambulatory Visit (HOSPITAL_COMMUNITY)
Admission: RE | Admit: 2024-03-30 | Discharge: 2024-03-30 | Disposition: A | Discharge status: Home / Self Care | Source: Ambulatory Visit | Attending: Internal Medicine | Admitting: Internal Medicine

## 2024-03-30 DIAGNOSIS — C9110 Chronic lymphocytic leukemia of B-cell type not having achieved remission: Secondary | ICD-10-CM | POA: Diagnosis not present

## 2024-03-30 DIAGNOSIS — K7460 Unspecified cirrhosis of liver: Secondary | ICD-10-CM | POA: Diagnosis not present

## 2024-03-30 DIAGNOSIS — R161 Splenomegaly, not elsewhere classified: Secondary | ICD-10-CM | POA: Diagnosis not present

## 2024-03-30 DIAGNOSIS — K769 Liver disease, unspecified: Secondary | ICD-10-CM | POA: Diagnosis not present

## 2024-03-30 DIAGNOSIS — K7581 Nonalcoholic steatohepatitis (NASH): Secondary | ICD-10-CM | POA: Diagnosis not present

## 2024-03-30 DIAGNOSIS — R932 Abnormal findings on diagnostic imaging of liver and biliary tract: Secondary | ICD-10-CM | POA: Diagnosis not present

## 2024-03-30 MED ORDER — GADOBUTROL 1 MMOL/ML IV SOLN
10.0000 mL | Freq: Once | INTRAVENOUS | Status: AC | PRN
  Administered 2024-03-30: 10 mL via INTRAVENOUS

## 2024-04-01 ENCOUNTER — Encounter: Payer: Self-pay | Admitting: Hematology

## 2024-04-01 ENCOUNTER — Other Ambulatory Visit (HOSPITAL_COMMUNITY): Payer: Self-pay

## 2024-04-01 ENCOUNTER — Other Ambulatory Visit: Payer: Self-pay

## 2024-04-01 DIAGNOSIS — H6992 Unspecified Eustachian tube disorder, left ear: Secondary | ICD-10-CM | POA: Diagnosis not present

## 2024-04-01 DIAGNOSIS — H9202 Otalgia, left ear: Secondary | ICD-10-CM | POA: Diagnosis not present

## 2024-04-01 DIAGNOSIS — N1832 Chronic kidney disease, stage 3b: Secondary | ICD-10-CM | POA: Diagnosis not present

## 2024-04-01 DIAGNOSIS — H6123 Impacted cerumen, bilateral: Secondary | ICD-10-CM | POA: Diagnosis not present

## 2024-04-01 DIAGNOSIS — H6692 Otitis media, unspecified, left ear: Secondary | ICD-10-CM | POA: Diagnosis not present

## 2024-04-01 DIAGNOSIS — I129 Hypertensive chronic kidney disease with stage 1 through stage 4 chronic kidney disease, or unspecified chronic kidney disease: Secondary | ICD-10-CM | POA: Diagnosis not present

## 2024-04-01 MED ORDER — AZITHROMYCIN 250 MG PO TABS
ORAL_TABLET | ORAL | 0 refills | Status: DC
  Filled 2024-04-01: qty 6, 5d supply, fill #0

## 2024-04-01 MED ORDER — NOVOLOG FLEXPEN 100 UNIT/ML ~~LOC~~ SOPN
30.0000 [IU] | PEN_INJECTOR | Freq: Three times a day (TID) | SUBCUTANEOUS | 3 refills | Status: AC
  Filled 2024-04-01: qty 90, 100d supply, fill #0

## 2024-04-02 ENCOUNTER — Ambulatory Visit: Payer: Self-pay | Admitting: Internal Medicine

## 2024-04-02 ENCOUNTER — Other Ambulatory Visit (HOSPITAL_COMMUNITY): Payer: Self-pay

## 2024-04-02 ENCOUNTER — Encounter: Payer: Self-pay | Admitting: Hematology

## 2024-04-03 ENCOUNTER — Other Ambulatory Visit (HOSPITAL_COMMUNITY): Payer: Self-pay

## 2024-04-03 ENCOUNTER — Other Ambulatory Visit: Payer: Self-pay

## 2024-04-03 DIAGNOSIS — B369 Superficial mycosis, unspecified: Secondary | ICD-10-CM | POA: Diagnosis not present

## 2024-04-03 DIAGNOSIS — N1832 Chronic kidney disease, stage 3b: Secondary | ICD-10-CM | POA: Diagnosis not present

## 2024-04-03 DIAGNOSIS — E782 Mixed hyperlipidemia: Secondary | ICD-10-CM | POA: Diagnosis not present

## 2024-04-03 DIAGNOSIS — I129 Hypertensive chronic kidney disease with stage 1 through stage 4 chronic kidney disease, or unspecified chronic kidney disease: Secondary | ICD-10-CM | POA: Diagnosis not present

## 2024-04-03 DIAGNOSIS — E119 Type 2 diabetes mellitus without complications: Secondary | ICD-10-CM | POA: Diagnosis not present

## 2024-04-03 DIAGNOSIS — K760 Fatty (change of) liver, not elsewhere classified: Secondary | ICD-10-CM | POA: Diagnosis not present

## 2024-04-03 DIAGNOSIS — Z794 Long term (current) use of insulin: Secondary | ICD-10-CM | POA: Diagnosis not present

## 2024-04-03 DIAGNOSIS — H6242 Otitis externa in other diseases classified elsewhere, left ear: Secondary | ICD-10-CM | POA: Diagnosis not present

## 2024-04-03 MED ORDER — TRESIBA FLEXTOUCH 200 UNIT/ML ~~LOC~~ SOPN
92.0000 [IU] | PEN_INJECTOR | Freq: Every day | SUBCUTANEOUS | 3 refills | Status: AC
  Filled 2024-04-03 – 2024-04-12 (×4): qty 12, 26d supply, fill #0
  Filled 2024-05-12 – 2024-05-15 (×2): qty 42, 90d supply, fill #0
  Filled 2024-08-08: qty 42, 90d supply, fill #1

## 2024-04-03 MED ORDER — ACETIC ACID 2 % OT SOLN
4.0000 [drp] | Freq: Two times a day (BID) | OTIC | 1 refills | Status: DC
  Filled 2024-04-03 (×2): qty 15, 38d supply, fill #0

## 2024-04-03 MED ORDER — NOVOLOG FLEXPEN 100 UNIT/ML ~~LOC~~ SOPN
70.0000 [IU] | PEN_INJECTOR | Freq: Three times a day (TID) | SUBCUTANEOUS | 3 refills | Status: DC
  Filled 2024-04-03 – 2024-04-06 (×4): qty 63, 21d supply, fill #0
  Filled 2024-05-17 – 2024-05-20 (×2): qty 63, 21d supply, fill #1
  Filled 2024-06-25: qty 63, 21d supply, fill #2
  Filled 2024-07-16: qty 63, 21d supply, fill #3
  Filled 2024-08-27: qty 63, 21d supply, fill #4

## 2024-04-03 NOTE — Telephone Encounter (Signed)
 Patient called and stated that she was just now able to return PJ call in regards to result from an MRI. Patient is requesting a call back from the nurse. Please advise.

## 2024-04-04 ENCOUNTER — Other Ambulatory Visit (HOSPITAL_COMMUNITY): Payer: Self-pay

## 2024-04-04 ENCOUNTER — Other Ambulatory Visit: Payer: Self-pay

## 2024-04-04 ENCOUNTER — Ambulatory Visit: Admitting: Cardiology

## 2024-04-05 ENCOUNTER — Ambulatory Visit: Payer: Self-pay | Admitting: Cardiology

## 2024-04-05 ENCOUNTER — Other Ambulatory Visit: Payer: Self-pay

## 2024-04-05 ENCOUNTER — Other Ambulatory Visit (HOSPITAL_COMMUNITY): Payer: Self-pay

## 2024-04-06 ENCOUNTER — Other Ambulatory Visit (HOSPITAL_COMMUNITY): Payer: Self-pay

## 2024-04-08 ENCOUNTER — Other Ambulatory Visit (HOSPITAL_COMMUNITY): Payer: Self-pay

## 2024-04-08 ENCOUNTER — Other Ambulatory Visit: Payer: Self-pay

## 2024-04-08 MED ORDER — ALBUTEROL SULFATE HFA 108 (90 BASE) MCG/ACT IN AERS
1.0000 | INHALATION_SPRAY | Freq: Four times a day (QID) | RESPIRATORY_TRACT | 4 refills | Status: AC | PRN
  Filled 2024-04-08: qty 6.7, 25d supply, fill #0
  Filled 2024-06-25: qty 6.7, 25d supply, fill #1
  Filled 2024-08-12: qty 6.7, 25d supply, fill #2

## 2024-04-09 ENCOUNTER — Other Ambulatory Visit: Payer: Self-pay

## 2024-04-09 NOTE — Progress Notes (Signed)
 Specialty Pharmacy Refill Coordination Note  Janice [name] is a 73 y.o. female contacted today regarding refills of specialty medication(s) Venetoclax  (VENCLEXTA )   Patient requested Delivery   Delivery date: 04/10/24   Verified address: [address]  [place]   Medication will be filled on 04/09/24.

## 2024-04-10 DIAGNOSIS — H7292 Unspecified perforation of tympanic membrane, left ear: Secondary | ICD-10-CM | POA: Diagnosis not present

## 2024-04-10 DIAGNOSIS — B369 Superficial mycosis, unspecified: Secondary | ICD-10-CM | POA: Diagnosis not present

## 2024-04-10 DIAGNOSIS — H6240 Otitis externa in other diseases classified elsewhere, unspecified ear: Secondary | ICD-10-CM | POA: Diagnosis not present

## 2024-04-11 ENCOUNTER — Other Ambulatory Visit (HOSPITAL_COMMUNITY): Payer: Self-pay

## 2024-04-11 ENCOUNTER — Other Ambulatory Visit: Payer: Self-pay

## 2024-04-11 MED ORDER — IRBESARTAN 300 MG PO TABS
300.0000 mg | ORAL_TABLET | Freq: Every evening | ORAL | 3 refills | Status: AC
  Filled 2024-04-11: qty 90, 90d supply, fill #0
  Filled 2024-07-06: qty 90, 90d supply, fill #1
  Filled 2024-10-08: qty 90, 90d supply, fill #2

## 2024-04-12 ENCOUNTER — Other Ambulatory Visit: Payer: Self-pay

## 2024-04-12 ENCOUNTER — Other Ambulatory Visit (HOSPITAL_COMMUNITY): Payer: Self-pay

## 2024-04-17 DIAGNOSIS — H7292 Unspecified perforation of tympanic membrane, left ear: Secondary | ICD-10-CM | POA: Diagnosis not present

## 2024-04-17 DIAGNOSIS — H6240 Otitis externa in other diseases classified elsewhere, unspecified ear: Secondary | ICD-10-CM | POA: Diagnosis not present

## 2024-04-17 DIAGNOSIS — B369 Superficial mycosis, unspecified: Secondary | ICD-10-CM | POA: Diagnosis not present

## 2024-04-18 ENCOUNTER — Encounter: Payer: Self-pay | Admitting: Hematology

## 2024-04-18 ENCOUNTER — Other Ambulatory Visit: Payer: Self-pay

## 2024-04-20 DIAGNOSIS — G4733 Obstructive sleep apnea (adult) (pediatric): Secondary | ICD-10-CM | POA: Diagnosis not present

## 2024-04-23 DIAGNOSIS — G4733 Obstructive sleep apnea (adult) (pediatric): Secondary | ICD-10-CM | POA: Diagnosis not present

## 2024-04-24 ENCOUNTER — Other Ambulatory Visit: Payer: Self-pay

## 2024-04-24 DIAGNOSIS — F331 Major depressive disorder, recurrent, moderate: Secondary | ICD-10-CM | POA: Diagnosis not present

## 2024-04-24 DIAGNOSIS — C8300 Small cell B-cell lymphoma, unspecified site: Secondary | ICD-10-CM | POA: Diagnosis not present

## 2024-04-24 DIAGNOSIS — D696 Thrombocytopenia, unspecified: Secondary | ICD-10-CM | POA: Diagnosis not present

## 2024-04-24 DIAGNOSIS — K7460 Unspecified cirrhosis of liver: Secondary | ICD-10-CM | POA: Diagnosis not present

## 2024-04-24 DIAGNOSIS — Z6837 Body mass index (BMI) 37.0-37.9, adult: Secondary | ICD-10-CM | POA: Diagnosis not present

## 2024-04-24 DIAGNOSIS — N1832 Chronic kidney disease, stage 3b: Secondary | ICD-10-CM | POA: Diagnosis not present

## 2024-04-24 DIAGNOSIS — I1310 Hypertensive heart and chronic kidney disease without heart failure, with stage 1 through stage 4 chronic kidney disease, or unspecified chronic kidney disease: Secondary | ICD-10-CM | POA: Diagnosis not present

## 2024-04-24 DIAGNOSIS — H9202 Otalgia, left ear: Secondary | ICD-10-CM | POA: Diagnosis not present

## 2024-04-24 DIAGNOSIS — E1122 Type 2 diabetes mellitus with diabetic chronic kidney disease: Secondary | ICD-10-CM | POA: Diagnosis not present

## 2024-04-24 DIAGNOSIS — G629 Polyneuropathy, unspecified: Secondary | ICD-10-CM | POA: Diagnosis not present

## 2024-04-24 DIAGNOSIS — K760 Fatty (change of) liver, not elsewhere classified: Secondary | ICD-10-CM | POA: Diagnosis not present

## 2024-04-24 NOTE — Progress Notes (Signed)
 Specialty Pharmacy Ongoing Clinical Assessment Note  Janice [name] is a 73 y.o. female who is being followed by the specialty pharmacy service for [facility] Oncology   Patient's specialty medication(s) reviewed today: Venetoclax  (VENCLEXTA )   Missed doses in the last 4 weeks: 0   Patient/Caregiver did not have any additional questions or concerns.   Therapeutic benefit summary: Patient is achieving benefit   Adverse events/side effects summary: Experienced adverse events/side effects (Patient experiencing continued diarrhea. Dr. Onesimo does not believe it is due to Venclexta . She has spoken also with PCP and GI who have not been able to pinpoint other causes. Patient continues Immodium PRN.)   Patient's therapy is appropriate to: Continue    Goals Addressed             This Visit's Progress    Achieve or maintain remission       Patient is not on track and no change. Patient will maintain adherence.  Dr. 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         Follow up: 6 months  Murphy Duzan M [name] Specialty Pharmacist

## 2024-04-25 ENCOUNTER — Other Ambulatory Visit: Payer: Self-pay

## 2024-04-25 ENCOUNTER — Other Ambulatory Visit: Payer: Self-pay | Admitting: Hematology

## 2024-04-25 DIAGNOSIS — C9110 Chronic lymphocytic leukemia of B-cell type not having achieved remission: Secondary | ICD-10-CM

## 2024-04-25 MED ORDER — VENETOCLAX 100 MG PO TABS
100.0000 mg | ORAL_TABLET | Freq: Every day | ORAL | 3 refills | Status: DC
  Filled 2024-04-25: qty 56, 56d supply, fill #0
  Filled 2024-04-30: qty 28, 28d supply, fill #0
  Filled 2024-05-30: qty 28, 28d supply, fill #1
  Filled 2024-06-25: qty 28, 28d supply, fill #2
  Filled 2024-07-22: qty 28, 28d supply, fill #3

## 2024-04-26 ENCOUNTER — Other Ambulatory Visit: Payer: Self-pay

## 2024-04-26 DIAGNOSIS — C9110 Chronic lymphocytic leukemia of B-cell type not having achieved remission: Secondary | ICD-10-CM

## 2024-04-29 ENCOUNTER — Inpatient Hospital Stay (HOSPITAL_BASED_OUTPATIENT_CLINIC_OR_DEPARTMENT_OTHER): Admitting: Hematology

## 2024-04-29 ENCOUNTER — Inpatient Hospital Stay: Attending: Hematology

## 2024-04-29 ENCOUNTER — Inpatient Hospital Stay

## 2024-04-29 ENCOUNTER — Other Ambulatory Visit: Payer: Self-pay

## 2024-04-29 VITALS — BP 133/51 | HR 73 | Temp 98.6°F | Resp 16

## 2024-04-29 VITALS — BP 119/74 | HR 75 | Temp 97.9°F | Resp 18 | Wt 225.4 lb

## 2024-04-29 DIAGNOSIS — D509 Iron deficiency anemia, unspecified: Secondary | ICD-10-CM

## 2024-04-29 DIAGNOSIS — D801 Nonfamilial hypogammaglobulinemia: Secondary | ICD-10-CM

## 2024-04-29 DIAGNOSIS — D696 Thrombocytopenia, unspecified: Secondary | ICD-10-CM | POA: Diagnosis not present

## 2024-04-29 DIAGNOSIS — C9110 Chronic lymphocytic leukemia of B-cell type not having achieved remission: Secondary | ICD-10-CM | POA: Diagnosis not present

## 2024-04-29 DIAGNOSIS — Z7982 Long term (current) use of aspirin: Secondary | ICD-10-CM | POA: Diagnosis not present

## 2024-04-29 DIAGNOSIS — R188 Other ascites: Secondary | ICD-10-CM | POA: Insufficient documentation

## 2024-04-29 DIAGNOSIS — Z79899 Other long term (current) drug therapy: Secondary | ICD-10-CM | POA: Insufficient documentation

## 2024-04-29 DIAGNOSIS — C8300 Small cell B-cell lymphoma, unspecified site: Secondary | ICD-10-CM

## 2024-04-29 DIAGNOSIS — R590 Localized enlarged lymph nodes: Secondary | ICD-10-CM | POA: Insufficient documentation

## 2024-04-29 DIAGNOSIS — Z7969 Long term (current) use of other immunomodulators and immunosuppressants: Secondary | ICD-10-CM | POA: Insufficient documentation

## 2024-04-29 DIAGNOSIS — K7460 Unspecified cirrhosis of liver: Secondary | ICD-10-CM | POA: Insufficient documentation

## 2024-04-29 DIAGNOSIS — Z7951 Long term (current) use of inhaled steroids: Secondary | ICD-10-CM | POA: Insufficient documentation

## 2024-04-29 DIAGNOSIS — R197 Diarrhea, unspecified: Secondary | ICD-10-CM | POA: Diagnosis not present

## 2024-04-29 DIAGNOSIS — R161 Splenomegaly, not elsewhere classified: Secondary | ICD-10-CM | POA: Diagnosis not present

## 2024-04-29 LAB — FERRITIN: Ferritin: 23 ng/mL (ref 11–307)

## 2024-04-29 LAB — CMP (CANCER CENTER ONLY)
ALT: 25 U/L (ref 0–44)
AST: 20 U/L (ref 15–41)
Albumin: 4.4 g/dL (ref 3.5–5.0)
Alkaline Phosphatase: 85 U/L (ref 38–126)
Anion gap: 6 (ref 5–15)
BUN: 23 mg/dL (ref 8–23)
CO2: 24 mmol/L (ref 22–32)
Calcium: 8.9 mg/dL (ref 8.9–10.3)
Chloride: 104 mmol/L (ref 98–111)
Creatinine: 1.07 mg/dL — ABNORMAL HIGH (ref 0.44–1.00)
GFR, Estimated: 55 mL/min — ABNORMAL LOW (ref >60)
Glucose, Bld: 154 mg/dL — ABNORMAL HIGH (ref 70–99)
Potassium: 5.4 mmol/L — ABNORMAL HIGH (ref 3.5–5.1)
Sodium: 134 mmol/L — ABNORMAL LOW (ref 135–145)
Total Bilirubin: 0.6 mg/dL (ref 0.0–1.2)
Total Protein: 6.5 g/dL (ref 6.5–8.1)

## 2024-04-29 LAB — CBC WITH DIFFERENTIAL (CANCER CENTER ONLY)
Abs Immature Granulocytes: 0.01 K/uL (ref 0.00–0.07)
Basophils Absolute: 0 K/uL (ref 0.0–0.1)
Basophils Relative: 1 %
Eosinophils Absolute: 0.1 K/uL (ref 0.0–0.5)
Eosinophils Relative: 2 %
HCT: 35.7 % — ABNORMAL LOW (ref 36.0–46.0)
Hemoglobin: 11.4 g/dL — ABNORMAL LOW (ref 12.0–15.0)
Immature Granulocytes: 0 %
Lymphocytes Relative: 11 %
Lymphs Abs: 0.6 K/uL — ABNORMAL LOW (ref 0.7–4.0)
MCH: 26.4 pg (ref 26.0–34.0)
MCHC: 31.9 g/dL (ref 30.0–36.0)
MCV: 82.6 fL (ref 80.0–100.0)
Monocytes Absolute: 0.6 K/uL (ref 0.1–1.0)
Monocytes Relative: 12 %
Neutro Abs: 3.9 K/uL (ref 1.7–7.7)
Neutrophils Relative %: 74 %
Platelet Count: 60 K/uL — ABNORMAL LOW (ref 150–400)
RBC: 4.32 MIL/uL (ref 3.87–5.11)
RDW: 15 % (ref 11.5–15.5)
WBC Count: 5.2 K/uL (ref 4.0–10.5)
nRBC: 0 % (ref 0.0–0.2)

## 2024-04-29 LAB — IRON AND IRON BINDING CAPACITY (CC-WL,HP ONLY)
Iron: 54 ug/dL (ref 28–170)
Saturation Ratios: 11 % (ref 10.4–31.8)
TIBC: 472 ug/dL — ABNORMAL HIGH (ref 250–450)
UIBC: 418 ug/dL (ref 148–442)

## 2024-04-29 LAB — URIC ACID: Uric Acid, Serum: 6.9 mg/dL (ref 2.5–7.1)

## 2024-04-29 LAB — LACTATE DEHYDROGENASE: LDH: 147 U/L (ref 98–192)

## 2024-04-29 MED ORDER — DIPHENHYDRAMINE HCL 25 MG PO CAPS
25.0000 mg | ORAL_CAPSULE | Freq: Once | ORAL | Status: AC
  Administered 2024-04-29: 25 mg via ORAL
  Filled 2024-04-29: qty 1

## 2024-04-29 MED ORDER — ACETAMINOPHEN 325 MG PO TABS
650.0000 mg | ORAL_TABLET | Freq: Once | ORAL | Status: AC
  Administered 2024-04-29: 650 mg via ORAL
  Filled 2024-04-29: qty 2

## 2024-04-29 MED ORDER — ONDANSETRON HCL 4 MG/2ML IJ SOLN
4.0000 mg | Freq: Once | INTRAMUSCULAR | Status: AC
  Administered 2024-04-29: 4 mg via INTRAVENOUS
  Filled 2024-04-29: qty 2

## 2024-04-29 MED ORDER — DEXTROSE 5 % IV SOLN: INTRAVENOUS | Status: DC

## 2024-04-29 MED ORDER — IMMUNE GLOBULIN (HUMAN) 10 GM/100ML IV SOLN
400.0000 mg/kg | INTRAVENOUS | Status: DC
  Administered 2024-04-29: 40 g via INTRAVENOUS
  Filled 2024-04-29: qty 400

## 2024-04-29 MED ORDER — METHYLPREDNISOLONE SODIUM SUCC 40 MG IJ SOLR
40.0000 mg | Freq: Every day | INTRAMUSCULAR | Status: DC
  Administered 2024-04-29: 40 mg via INTRAVENOUS
  Filled 2024-04-29: qty 1

## 2024-04-29 NOTE — Patient Instructions (Signed)
 Immune Globulin  Injection What is this medication? IMMUNE GLOBULIN  (im MUNE GLOB yoo lin) treats many immune system conditions. It works by Designer, multimedia extra antibodies. Antibodies are proteins made by the immune system that help protect the body. This medicine may be used for other purposes; ask your health care provider or pharmacist if you have questions. COMMON BRAND NAME(S): ASCENIV, Baygam, BIVIGAM, Carimune, Carimune NF, cutaquig, Cuvitru, Flebogamma, Flebogamma DIF, GamaSTAN, GamaSTAN S/D, Gamimune N, Gammagard, Gammagard S/D, Gammaked, Gammaplex, Gammar-P IV, Gamunex, Gamunex-C, Hizentra, Iveegam, Iveegam EN, Octagam, Panglobulin, Panglobulin NF, panzyga, Polygam S/D, Privigen , Sandoglobulin, Venoglobulin-S, Vigam, Vivaglobulin, Xembify What should I tell my care team before I take this medication? They need to know if you have any of these conditions: Blood clotting disorder Condition where you have excess fluid in your body, such as heart failure or edema Dehydration Diabetes Have had blood clots Heart disease Immune system conditions Kidney disease Low levels of IgA Recent or upcoming vaccine An unusual or allergic reaction to immune globulin , other medications, foods, dyes, or preservatives Pregnant or trying to get pregnant Breastfeeding How should I use this medication? This medication is infused into a vein or under the skin. It may also be injected into a muscle. It is usually given by your care team in a hospital or clinic setting. It may also be given at home. If you get this medication at home, you will be taught how to prepare and give it. Take it as directed on the prescription label. Keep taking it unless your care team tells you to stop. It is important that you put your used needles and syringes in a special sharps container. Do not put them in a trash can. If you do not have a sharps container, call your pharmacist or care team to get one. Talk to your care team  about the use of this medication in children. While it may be given to children for selected conditions, precautions do apply. Overdosage: If you think you have taken too much of this medicine contact a poison control center or emergency room at once. NOTE: This medicine is only for you. Do not share this medicine with others. What if I miss a dose? If you get this medication at the hospital or clinic: It is important not to miss your dose. Call your care team if you are unable to keep an appointment. If you give yourself this medication at home: If you miss a dose, take it as soon as you can. Then continue your normal schedule. If it is almost time for your next dose, take only that dose. Do not take double or extra doses. Call your care team with questions. What may interact with this medication? Live virus vaccines This list may not describe all possible interactions. Give your health care provider a list of all the medicines, herbs, non-prescription drugs, or dietary supplements you use. Also tell them if you smoke, drink alcohol , or use illegal drugs. Some items may interact with your medicine. What should I watch for while using this medication? Your condition will be monitored carefully while you are receiving this medication. Tell your care team if your symptoms do not start to get better or if they get worse. You may need blood work done while you are taking this medication. This medication increases the risk of blood clots. People with heart, blood vessel, or blood clotting conditions are more likely to develop a blood clot. Other risk factors include advanced age, estrogen use, tobacco  use, lack of movement, and being overweight. This medication can decrease the response to a vaccine. If you need to get vaccinated, tell your care team if you have received this medication within the last year. Extra booster doses may be needed. Talk to your care team to see if a different vaccination schedule  is needed. This medication is made from donated human blood. There is a small risk it may contain bacteria or viruses, such as hepatitis or HIV. All products are processed to kill most bacteria and viruses. Talk to your care team if you have questions about the risk of infection. If you have diabetes, talk to your care team about which device you should use to check your blood sugar. This medication may cause some devices to report falsely high blood sugar levels. This may cause you to react by not treating a low blood sugar level or by giving an insulin  dose that was not needed. This can cause severe low blood sugar levels. What side effects may I notice from receiving this medication? Side effects that you should report to your care team as soon as possible: Allergic reactions--skin rash, itching, hives, swelling of the face, lips, tongue, or throat Blood clot--pain, swelling, or warmth in the leg, shortness of breath, chest pain Fever, neck pain or stiffness, sensitivity to light, headache, nausea, vomiting, confusion, which may be signs of meningitis Hemolytic anemia--unusual weakness or fatigue, dizziness, headache, trouble breathing, dark urine, yellowing skin or eyes Kidney injury--decrease in the amount of urine, swelling of the ankles, hands, or feet Low sodium level--muscle weakness, fatigue, dizziness, headache, confusion Shortness of breath or trouble breathing, cough, unusual weakness or fatigue, blue skin or lips Side effects that usually do not require medical attention (report these to your care team if they continue or are bothersome): Chills Diarrhea Fever Headache Nausea This list may not describe all possible side effects. Call your doctor for medical advice about side effects. You may report side effects to FDA at 1-800-FDA-1088. Where should I keep my medication? Keep out of the reach of children and pets. You will be instructed on how to store this medication. Get rid of  any unused medication after the expiration date. To get rid of medications that are no longer needed or have expired: Take the medication to a medication take-back program. Check with your pharmacy or law enforcement to find a location. If you cannot return the medication, ask your pharmacist or care team how to get rid of this medication safely. NOTE: This sheet is a summary. It may not cover all possible information. If you have questions about this medicine, talk to your doctor, pharmacist, or health care provider.  2025 Elsevier/Gold Standard (2023-11-13 00:00:00)

## 2024-04-29 NOTE — Progress Notes (Signed)
 Pt declined to be observed for 30 minutes post Privigen  infusion. Pt tolerated Tx well w/out incident. VSS at discharge.  Ambulatory to lobby.

## 2024-04-30 ENCOUNTER — Other Ambulatory Visit: Payer: Self-pay

## 2024-04-30 ENCOUNTER — Encounter: Payer: Self-pay | Admitting: Internal Medicine

## 2024-04-30 ENCOUNTER — Other Ambulatory Visit: Payer: Self-pay | Admitting: Pharmacy Technician

## 2024-04-30 NOTE — Progress Notes (Signed)
 Clinical Intervention Note  Clinical Intervention Notes: Patient reported now using chloramphenical powder for ear infection. No DDIs identified with Venclexta    Clinical Intervention Outcomes: Prevention of an adverse drug event   Advertising account planner

## 2024-04-30 NOTE — Progress Notes (Signed)
 Specialty Pharmacy Refill Coordination Note  Janice [name] is a 73 y.o. female contacted today regarding refills of specialty medication(s) Venetoclax  (VENCLEXTA )   Patient requested Delivery   Delivery date: 05/08/24   Verified address: [id number]   [place] [address]   Medication will be filled on 05/07/24.

## 2024-05-01 DIAGNOSIS — B369 Superficial mycosis, unspecified: Secondary | ICD-10-CM | POA: Diagnosis not present

## 2024-05-01 DIAGNOSIS — H7292 Unspecified perforation of tympanic membrane, left ear: Secondary | ICD-10-CM | POA: Diagnosis not present

## 2024-05-01 DIAGNOSIS — H6240 Otitis externa in other diseases classified elsewhere, unspecified ear: Secondary | ICD-10-CM | POA: Diagnosis not present

## 2024-05-05 ENCOUNTER — Encounter: Payer: Self-pay | Admitting: Hematology

## 2024-05-05 NOTE — Progress Notes (Signed)
 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  INTERVAL HISTORY:  Janice [name] is a 73 y.o. female who is here for continued evaluation and management of her CLL with hypogammaglobinemia. She notes that she is now off her metformin  and her chronic diarrhea has somewhat improved. She still has some minimal intermittent grade 1 diarrhea. We discussed that it could be related to other things including pancreatic insufficiency but can also possibly be from her venetoclax  though she is on a very small dose of 100 mg p.o. daily. She notes that her loose stools are currently manageable and she does not want to stop or reduce the already low dose of venetoclax . She notes some intermittent fluid retention for which she is on diuretics for her liver cirrhosis. Labs done today were discussed with her in details She notes no acute new issues with infections, bleeding or new fatigue. She notes that she is having some abdominal discomfort intermittently and is scheduled for an endoscopy with Dr. Avram on 05/16/2024 to evaluate her epigastric discomfort as well as for variceal screening.  MEDICAL HISTORY:  Past Medical History:  Diagnosis Date   Allergy    Asthma    seasonal   Cellulitis 2013   Left toe   Chronic kidney disease    stage 1   Cirrhosis (HCC)    Common migraine    History of   Complication of anesthesia    Per pt/had breathing problems with block during rotator cuff surgery. 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    Coronary artery disease    DDD (degenerative disc disease)    Depression    denies takes paxil for migraines   Diabetes mellitus    type 2   Diabetic peripheral neuropathy (HCC)    Difficult airway for intubation 10/20/2022   DLx 1 with MAC 4, unable to see cords, DL with lopro S3 glidescope, easy atraumatic intubation   Difficult intubation 03/03/2023   Diverticulitis    Diverticulosis    DJD (degenerative joint disease)    Esophageal varices (HCC)    GERD (gastroesophageal reflux disease)    History of colon polyps    hyperplastic   History of gastric polyp    Hyperlipidemia    Hypertension    Iron  deficiency anemia    Obesity    OSA on CPAP    cpap   Peripheral neuropathy    Pneumonia    april 2020  mild   Primary localized osteoarthritis of left knee 03/27/2019   Sensorineural hearing loss    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HCC)     SURGICAL HISTORY: Past Surgical History:  Procedure Laterality Date   ANKLE SURGERY     Left    BREAST BIOPSY Left 2021   left axilla, lymphoma   COLONOSCOPY  05/2018   ESOPHAGOGASTRODUODENOSCOPY (EGD) WITH PROPOFOL  N/A 03/09/2023   Procedure: ESOPHAGOGASTRODUODENOSCOPY (EGD) WITH PROPOFOL ;  Surgeon: Avram Lupita BRAVO, MD;  Location: [facility];  Service: Gastroenterology;  Laterality: N/A;   LEEP N/A 09/14/2018   Procedure: LOOP ELECTROSURGICAL EXCISION PROCEDURE (LEEP);  Surgeon: Leva Rush, MD;  Location: [facility];  Service: Gynecology;  Laterality: N/A;   MASS EXCISION Right 06/24/2021   Procedure: EXCISION RIGHT POSTERIOR NECK MASS;  Surgeon: Kinsinger, Herlene Righter, MD;  Location: [facility];  Service: General;  Laterality: Right;   ROTATOR CUFF REPAIR Bilateral 2012, 2015   TONSILLECTOMY     TOTAL KNEE ARTHROPLASTY Left 04/08/2019   Procedure: TOTAL KNEE ARTHROPLASTY;  Surgeon: Jane Charleston, MD;  Location: [facility];  Service: Orthopedics;  Laterality: Left;   UMBILICAL HERNIA REPAIR N/A 10/20/2022   Procedure: OPEN  HERNIA REPAIR UMBILICAL ADULT with Mesh;  Surgeon: Kinsinger, Herlene Righter, MD;  Location: [facility];  Service: General;  Laterality: N/A;   UPPER GI ENDOSCOPY  05/2018    SOCIAL HISTORY: Social History   Socioeconomic History   Marital status: Widowed    Spouse name: Not on file   Number of children: 0   Years of education: 3 years college   Highest education level: Not on file  Occupational History   Occupation: Unemployed  Tobacco Use   Smoking status: Never    Passive exposure: Past   Smokeless tobacco: Never  Vaping Use   Vaping status: Never Used  Substance and Sexual Activity   Alcohol  use: Not Currently    Comment: very rare   Drug use: No   Sexual activity: Not Currently    Birth control/protection: None, Post-menopausal  Other Topics Concern   Not on file  Social History Narrative   1 caffeine drink daily    Right-handed   widow (husband died from [place] cirrhosis)   Social Drivers of Health   Financial Resource Strain: Medium Risk (08/20/2020)   Overall Financial Resource Strain (CARDIA)    Difficulty of Paying Living Expenses: Somewhat hard  Food Insecurity: No Food Insecurity (10/20/2022)   Hunger Vital Sign    Worried About Running Out of Food in the Last Year: Never true    Ran Out of Food in the Last Year: Never true  Transportation Needs: No Transportation Needs (10/20/2022)   PRAPARE - Administrator, Civil Service (Medical): No    Lack of Transportation (Non-Medical): No  Physical Activity: Not on file  Stress: Not on file  Social Connections: Unknown (01/25/2022)   Received from [facility]   Social Network    Social Network: Not on file  Intimate Partner Violence: Not At Risk (10/20/2022)   Humiliation, Afraid, Rape, and Kick questionnaire    Fear of Current or Ex-Partner: No    Emotionally Abused: No    Physically Abused: No    Sexually Abused: No    FAMILY HISTORY: Family History  Problem Relation Age of Onset   Breast cancer Mother     Bone cancer Mother    Heart failure Father    Diabetes Mellitus II Father    Hypertension Father    Diabetes Mellitus II Sister    Obesity Sister    Other Sister        retina problem   Hypertension Brother    Diabetes Mellitus II Brother    Colon cancer Neg Hx    Rectal cancer Neg Hx    Stomach cancer Neg Hx    Esophageal cancer Neg Hx    Pancreatic cancer Neg Hx    Liver disease Neg Hx     ALLERGIES:  is allergic to breo ellipta [fluticasone  furoate-vilanterol], gazyva  [obinutuzumab ], iodine , latex, tessalon  [benzonatate ], zocor [simvastatin], levaquin [levofloxacin], penicillins, povidone-iodine , wellbutrin [bupropion], amoxicillin, chlorhexidine , diflucan [fluconazole], thalitone  [chlorthalidone ], amoxicillin-pot clavulanate, and  vibra-tab [doxycycline].  MEDICATIONS:  Current Outpatient Medications  Medication Sig Dispense Refill   acetic acid  2 % otic solution Place 4 drops into the left ear 2 (two) times daily. 15 mL 1   albuterol  (VENTOLIN  HFA) 108 (90 Base) MCG/ACT inhaler Inhale 1-2 puffs into the lungs every 6 (six) hours as needed (Cough). 18 g 0   albuterol  (VENTOLIN  HFA) 108 (90 Base) MCG/ACT inhaler Inhale 1-2 puffs into the lungs every 6 (six) hours as needed and as directed for shortness of breath. 6.7 g 4   aspirin  EC 81 MG tablet Take 1 tablet (81 mg total) by mouth daily. Swallow whole. 90 tablet 3   azelastine  (ASTELIN ) 0.1 % nasal spray Place 1 spray into both nostrils 2 (two) times daily. Use in each nostril as directed 9 mL 11   azithromycin  (ZITHROMAX ) 250 MG tablet Take 2 tablets first day and then one tablet for the next four days. 6 tablet 0   b complex vitamins capsule Take 1 capsule by mouth in the morning.     bacitracin  500 UNIT/GM ointment Apply topically 2 (two) times daily for 10 days 30 g 0   budesonide -formoterol  (SYMBICORT ) 80-4.5 MCG/ACT inhaler Inhale 2 puffs into the lungs daily. 10.2 g 12   Carboxymethylcellul-Glycerin (REFRESH OPTIVE PF  OP) Place 1 drop into both eyes 3 (three) times daily as needed (dry/irritated eyes.). Non-pres     cephALEXin  (KEFLEX ) 500 MG capsule Take 2 capsules (1,000 mg total) by mouth as directed 1 hour prior to dental work. 20 capsule 0   Coenzyme Q10 (CO Q-10) 100 MG CAPS Take 100 mg by mouth in the morning.     Continuous Glucose Sensor (FREESTYLE LIBRE 2 SENSOR) MISC Replace every 14 days and use to check blood sugar continuously. 2 each 3   Continuous Glucose Sensor (FREESTYLE LIBRE 2 SENSOR) MISC Replace every 14 days and use to check blood sugar continuously. 6 each 3   desoximetasone  (TOPICORT ) 0.25 % cream Apply a small amount to skin twice a day as needed for flares 60 g 0   diclofenac  Sodium (VOLTAREN  ARTHRITIS PAIN) 1 % GEL Apply topically 4 (four) times daily to bilateral knees and ankles 200 g 5   diclofenac  sodium (VOLTAREN ) 1 % GEL Apply 2 g topically 2 (two) times daily as needed (knee pain/ foot /Triger finger).  2   empagliflozin  (JARDIANCE ) 10 MG TABS tablet Take 1 tablet (10 mg total) by mouth daily. 90 tablet 3   fluticasone  (FLONASE ) 50 MCG/ACT nasal spray Place 2 sprays into both nostrils in the morning. 48 g 3   furosemide  (LASIX ) 20 MG tablet Take 1 tablet (20 mg total) by mouth as needed. Take if you gain 1 lb in 24 hours, or 3 lbs in 1 week. Take it daily and stop when weight back to baseline. 30 tablet 6   gabapentin  (NEURONTIN ) 300 MG capsule Take 2 capsules (600 mg total) by mouth at bedtime. 180 capsule 3   glucose blood (ONETOUCH VERIO) test strip Use as directed to check blood sugar 3 times a day. 300 strip 3   glucose blood test strip Use to test blood sugar 3 times daily as directed 300 each 3   hydrocortisone  (ANUSOL -HC) 25 MG suppository Place 1 suppository (25 mg total) rectally daily. 30 suppository 0   influenza vaccine adjuvanted (FLUAD ) 0.5 ML injection Inject into the muscle. 0.5 mL 0   insulin  aspart (NOVOLOG  FLEXPEN) 100 UNIT/ML FlexPen Inject 30 Units into the  skin daily with breakfast AND 30 Units daily with lunch AND 30 Units daily with supper. 45 mL 3   insulin  aspart (NOVOLOG  FLEXPEN) 100 UNIT/ML FlexPen Inject 30 Units into the skin daily with breakfast AND 30 Units daily with lunch AND 30 Units daily with supper. 90 mL 3   insulin  aspart (NOVOLOG  FLEXPEN) 100 UNIT/ML FlexPen Inject 70 units under the skin with meals 3 times a day (add ISF 10, target 200 - up to 300 units/day) 65 mL 3   insulin  degludec (TRESIBA  FLEXTOUCH) 200 UNIT/ML FlexTouch Pen Inject 80 Units into the skin daily. 30 mL 5   insulin  degludec (TRESIBA  FLEXTOUCH) 200 UNIT/ML FlexTouch Pen Inject 92 Units into the skin once daily. 12 mL 3   Insulin  Pen Needle 32G X 4 MM MISC Use as directed 5 (five) times daily. 400 each 3   irbesartan  (AVAPRO ) 300 MG tablet Take 1 tablet (300 mg total) by mouth every evening.     irbesartan  (AVAPRO ) 300 MG tablet Take 1 tablet (300 mg total) by mouth in the morning. 90 tablet 3   irbesartan  (AVAPRO ) 300 MG tablet Take 1 tablet (300 mg total) by mouth every evening. 90 tablet 3   Lancets (ONETOUCH DELICA PLUS LANCET33G) MISC Use to test blood sugar 3 times daily 300 each 2   LORazepam  (ATIVAN ) 0.5 MG tablet Take 1 or 2 before MRI to reduce claustrophobia 2 tablet 0   omeprazole  (PRILOSEC) 40 MG capsule Take 1 capsule (40 mg total) by mouth 2 (two) times daily. 180 capsule 3   ondansetron  (ZOFRAN -ODT) 4 MG disintegrating tablet Dissolve 1 tablet (4 mg) by mouth every 8 hours as needed for nausea or vomiting. 20 tablet 0   prednisoLONE  acetate (PRED FORTE ) 1 % ophthalmic suspension Place 1 drop into both eyes 4 (four) times daily. 10 mL 0   prednisoLONE  acetate (PREDNISOLONE  ACETATE P-F) 1 % ophthalmic suspension Place 1 drop into both eyes 4 (four) times daily. 5 mL 0   rosuvastatin  (CRESTOR ) 10 MG tablet Take 1 tablet (10 mg total) by mouth daily. 90 tablet 3   sertraline  (ZOLOFT ) 50 MG tablet Take 1 tablet (50 mg total) by mouth daily. 90 tablet 3    spironolactone  (ALDACTONE ) 50 MG tablet Take 1 tablet (50 mg total) by mouth daily. 90 tablet 3   terconazole (TERAZOL 3) 0.8 % vaginal cream Place 1 applicator vaginally daily as needed (yeast infections).     venetoclax  (VENCLEXTA ) 100 MG tablet Take 1 tablet (100 mg total) by mouth daily. Tablets should be swallowed whole with a meal and a full glass of water . 30 tablet 3   Zoster Vaccine Adjuvanted (SHINGRIX ) injection Inject into the muscle. 1 each 1   No current facility-administered medications for this visit.    REVIEW OF SYSTEMS:    .10 Point review of Systems was done is negative except as noted above.  PHYSICAL EXAMINATION:  .BP 119/74   Pulse 75   Temp 97.9 F (36.6 C)   Resp 18   Wt 225 lb 6.4 oz (102.2 kg)   SpO2 96%   BMI 38.69 kg/m  . GENERAL:alert, in no acute distress and comfortable SKIN: no acute rashes, no significant lesions EYES: conjunctiva are pink and non-injected, sclera anicteric OROPHARYNX: MMM, no exudates, no oropharyngeal erythema or ulceration NECK: supple, no JVD LYMPH:  no palpable lymphadenopathy in the cervical, axillary or inguinal regions LUNGS: clear to auscultation b/l with normal respiratory effort HEART: regular rate & rhythm ABDOMEN:  normoactive bowel sounds , non tender, mildly distended, palpable splenomegaly Extremity: no pedal edema PSYCH: alert & oriented x 3 with fluent speech NEURO: no focal motor/sensory deficits   LABORATORY DATA:  I have reviewed the data as listed  .    Latest Ref Rng & Units 04/29/2024   11:23 AM 03/29/2024   12:40 PM 02/28/2024   11:25 AM  CBC  WBC 4.0 - 10.5 K/uL 5.2  7.1  6.3   Hemoglobin 12.0 - 15.0 g/dL 88.5  86.4  88.1   Hematocrit 36.0 - 46.0 % 35.7  41.2  35.9   Platelets 150 - 400 K/uL 60  68  61    . CBC    Component Value Date/Time   WBC 5.2 04/29/2024 1123   WBC 56.9 (HH) 10/20/2022 1148   RBC 4.32 04/29/2024 1123   HGB 11.4 (L) 04/29/2024 1123   HGB 10.3 (L) 10/18/2021 1222    HCT 35.7 (L) 04/29/2024 1123   HCT 33.0 (L) 10/18/2021 1222   PLT 60 (L) 04/29/2024 1123   PLT 100 (LL) 10/18/2021 1222   MCV 82.6 04/29/2024 1123   MCV 84 10/18/2021 1222   MCH 26.4 04/29/2024 1123   MCHC 31.9 04/29/2024 1123   RDW 15.0 04/29/2024 1123   RDW 15.1 10/18/2021 1222   LYMPHSABS 0.6 (L) 04/29/2024 1123   MONOABS 0.6 04/29/2024 1123   EOSABS 0.1 04/29/2024 1123   BASOSABS 0.0 04/29/2024 1123     .    Latest Ref Rng & Units 04/29/2024   11:23 AM 03/29/2024   12:40 PM 02/28/2024   11:25 AM  CMP  Glucose 70 - 99 mg/dL 845  757  884   BUN 8 - 23 mg/dL 23  36  30   Creatinine 0.44 - 1.00 mg/dL 8.92  8.78  8.99   Sodium 135 - 145 mmol/L 134  131  134   Potassium 3.5 - 5.1 mmol/L 5.4  5.4  4.9   Chloride 98 - 111 mmol/L 104  100  104   CO2 22 - 32 mmol/L 24  24  23    Calcium  8.9 - 10.3 mg/dL 8.9  9.5  9.3   Total Protein 6.5 - 8.1 g/dL 6.5  7.4  6.9   Total Bilirubin 0.0 - 1.2 mg/dL 0.6  0.6  0.5   Alkaline Phos 38 - 126 U/L 85  98  82   AST 15 - 41 U/L 20  20  16    ALT 0 - 44 U/L 25  27  26     . Lab Results  Component Value Date   LDH 147 04/29/2024   . Lab Results  Component Value Date   IRON  54 04/29/2024   TIBC 472 (H) 04/29/2024   IRONPCTSAT 11 04/29/2024   (Iron  and TIBC)  Lab Results  Component Value Date   FERRITIN 23 04/29/2024    04/08/2020 FISH/CLL Panel:   03/30/2020 Left Axilla Flow Pathology Report [id number]):   03/30/2020 Left axilla lymph node Bx ([id number]):   RADIOGRAPHIC STUDIES: I have personally reviewed the radiological images as listed and agreed with the findings in the report. No results found.   MRI abdomen with and without contrast 07/08/2021 showed IMPRESSION: 1. Moderate motion and patient body habitus degraded exam. 2. Extensive abdominal adenopathy, similar to slightly increased compared to 05/01/2020 PET. 3. Cirrhosis and hepatosplenomegaly with suspicion of periesophageal varices. 4. Nonspecific  hypoenhancing or nonenhancing splenic lesions. These could simply represent Ora Orange bodies in the setting  of cirrhosis. Lymphomatous involvement cannot be excluded. 5. Heterogeneous enhancement throughout the liver, without well-defined suspicious mass. Possibly due to altered perfusion in the setting of cirrhosis and porta hepatis adenopathy. Lymphomatous involvement cannot be excluded. Consider pre and post-contrast MRI follow-up at 2-3 months. A more aggressive approach would include random liver biopsy.  MRI abdomen with and without contrast 02/04/2022 showed IMPRESSION: 1. Examination is generally limited by breath motion artifact, particularly multiphasic contrast enhanced sequences. Within this limitation, cirrhotic morphology of the liver without focal liver lesion. 2. 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 3. Trace ascites. 4. Numerous enlarged gastrohepatic ligament, portacaval, retroperitoneal, and small bowel mesenteric lymph nodes are unchanged, in keeping with history of lymphoma.  ASSESSMENT & PLAN:   73 yo female with   1) CLL/SLL -02/28/2020 MM Breast ([id number]) revealed Bilateral axillary adenopathy. -03/30/2020 Left axilla lymph node biopsy ([id number]) revealed CHRONIC LYMPHOCYTIC LYMPHOMA/SMALL LYMPHOCYTIC LYMPHOMA.  -03/30/2020 Left Axilla Flow Pathology Report [id number]) revealed Monoclonal B-cell population with coexpression of CD5 comprises 80% of all lymphocytes. -04/20/20 PET/CT revealed 1. Adenopathy within the neck, chest, abdomen, and pelvis, consistent with active lymphoma. Much of this is not significantly hypermetabolic. Some upper abdominal nodes are moderately hypermetabolic.  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 NO overt GI bleeding noted. . Lab Results  Component Value Date   IRON  54 04/29/2024   TIBC 472 (H) 04/29/2024   IRONPCTSAT 11 04/29/2024   (Iron  and TIBC)  Lab Results  Component Value Date   FERRITIN 23 04/29/2024    PLAN:  - Discussed available lab results from 04/29/2024 CBC shows stable WBC count of 5.2k with no lymphocytosis, hemoglobin 11.4 and chronic thrombocytopenia with platelets of 60k CMP stable potassium still elevated at 5.4 stable kidney function LDH within normal limits at 147 Iron  deficiency noted with a ferritin of 23 and iron  saturation of 11%. Given her liver cirrhosis and risk of chronic GI losses would offer her the option of IV iron  replacement. Patient's diarrhea has improved after getting off the metformin  but there is still some grade 1 diarrhea.  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  It is possible that there is some effect from her low-dose venetoclax . - Patient notes the diarrhea is manageable and she would like to continue her low-dose venetoclax  at 100 mg p.o. daily which is helping her with her CLL control. - She will explore other possibilities of diarrhea with her gastroenterologist. - She is scheduled for an EGD with Dr. Avram on 9//2025. - No significant infection issues.  We shall continue her IVIG every 4 weeks for hypogammaglobinemia. - She is recommended to stay up-to-date with her age-appropriate vaccinations including her flu shot and COVID-19 vaccinations. FOLLOW-UP: Continue IVIG every 4 weeks IV Venofer  currently milligrams weekly x 3 doses RTC with Dr Onesimo with  labs in 2 months   The total time spent in the appointment was 30 minutes* .  All of the patient's questions were answered with apparent satisfaction. The patient knows to call the clinic with any problems, questions or concerns.   Emaline Saran MD MS AAHIVMS [facility] Hematology/Oncology Physician [facility]  .*Total Encounter Time as defined by the Centers for Medicare and Medicaid Services includes, in addition to the face-to-face time of a patient visit (documented in the note above) non-face-to-face time: obtaining and reviewing outside history, ordering and reviewing medications, tests or procedures, care coordination (communications with other health care professionals or caregivers) and documentation in the medical record.    I,Mitra Faeizi,acting as a Neurosurgeon for Emaline Saran, MD.,have documented all relevant documentation on the behalf of Emaline Saran, MD,as directed by  Emaline Saran, MD while in the presence of Emaline Saran, MD.  .I have reviewed the above documentation for accuracy and completeness, and I agree with the above. .Amit Leece Kishore Harmoni Lucus MD

## 2024-05-07 ENCOUNTER — Other Ambulatory Visit: Payer: Self-pay

## 2024-05-08 ENCOUNTER — Ambulatory Visit: Attending: Cardiovascular Disease | Admitting: Cardiology

## 2024-05-08 ENCOUNTER — Encounter: Payer: Self-pay | Admitting: Cardiology

## 2024-05-08 ENCOUNTER — Telehealth: Payer: Self-pay | Admitting: Gastroenterology

## 2024-05-08 VITALS — BP 112/71 | HR 94 | Resp 16 | Ht 64.0 in | Wt 220.4 lb

## 2024-05-08 DIAGNOSIS — G4730 Sleep apnea, unspecified: Secondary | ICD-10-CM | POA: Diagnosis not present

## 2024-05-08 DIAGNOSIS — I5189 Other ill-defined heart diseases: Secondary | ICD-10-CM | POA: Diagnosis not present

## 2024-05-08 DIAGNOSIS — I6523 Occlusion and stenosis of bilateral carotid arteries: Secondary | ICD-10-CM | POA: Diagnosis not present

## 2024-05-08 DIAGNOSIS — I10 Essential (primary) hypertension: Secondary | ICD-10-CM

## 2024-05-08 NOTE — Progress Notes (Signed)
 Cardiology Office Note:  .    ID:  Janice [name], DOB 07/25/1951, MRN [id number] PCP:  Janice Santos, MD  Former Cardiology Providers: Dr. Newman [name], Janice Berliner, PA  [facility] Providers Cardiologist:  Janice Large, DO , [facility] (established care  04/06/23) Electrophysiologist:  None  Click to update primary MD,subspecialty MD or APP then REFRESH:1}    Chief Complaint  Patient presents with   Asymptomatic bilateral carotid artery stenosis   Follow-up    History of Present Illness: .   Janice [name] is a 73 y.o. Caucasian female whose past medical history and cardiovascular risk factors includes: Hypertension, diabetes mellitus type 2, aortic regurgitation, carotid artery atherosclerosis, sleep apnea in adult, liver cirrhosis, chronic lymphocytic lymphoma, anemia, postmenopausal female.   Patient being followed by the practice given her underlying carotid disease.   No symptoms of carotid disease since the last office visit.  No new results available for review regarding her carotid disease.  She has stopped ASA 81 mg po qday due to nose bleed.  Remains on Crestor  10 mg po qday.   She felt that recent weight gain was due to water  weight.  She had an ultrasound of the abdomen and MRI of liver and no ascites was noted.  Likely her weight gain is due to dietary habits   Review of Systems: .   Review of Systems  Cardiovascular:  Negative for chest pain, claudication, irregular heartbeat, leg swelling, near-syncope, orthopnea, palpitations, paroxysmal nocturnal dyspnea and syncope.  Respiratory:  Negative for shortness of breath.   Hematologic/Lymphatic: Negative for bleeding problem.    Studies Reviewed:    Echocardiogram: 11/08/2021: Normal LV systolic function with visual EF 60-65%. Left ventricle cavity is normal in size. No obvious regional wall motion abnormalities. Normal left ventricular wall thickness. Normal global wall motion. Doppler  evidence of grade II (pseudonormal) diastolic dysfunction, elevated LAP. Mild (Grade I) aortic regurgitation. Mild (Grade I) mitral regurgitation. Mild tricuspid regurgitation. Mild pulmonary hypertension. RVSP measures 41 mmHg. Compared to study 02/03/2021 no significant change.   03/28/2023: Left ventricle cavity is normal in size. Mild concentric hypertrophy of the left ventricle. Normal global wall motion. Normal LV systolic function with EF 65%. Doppler evidence of grade I (impaired) diastolic dysfunction, normal LAP.  Left atrial cavity is moderately dilated. Structurally normal trileaflet aortic valve. Mild (Grade I) aortic regurgitation. Mild mitral valve leaflet thickening with mild calcification. Trace mitral regurgitation. Mild tricuspid regurgitation.  No evidence of pulmonary hypertension.   Stress Testing: Lexiscan  Tetrofosmin  stress test 11/08/2021: Low risk study.  Carotid duplex: 02/01/2022:  Duplex suggests stenosis in the right internal carotid artery (50-69%).  Duplex suggests stenosis in the left internal carotid artery (50-69%).  Compared to 02/03/2021, there is progression of stenosis severity in bilateral carotid arteries.    03/28/2023:  Duplex suggests stenosis in the right internal carotid artery (minimal).   Duplex suggests stenosis in the left internal carotid artery (16-49%).  Antegrade right vertebral artery flow. Antegrade left vertebral artery flow.  Immediate comparison to study 02/01/2022 not performed due to significant velocity variation however compared to 02/03/2021, no significant change.  Follow up in one year is appropriate if clinically indicated.   RADIOLOGY: NA  Risk Assessment/Calculations:   NA   Labs:       Latest Ref Rng & Units 04/29/2024   11:23 AM 03/29/2024   12:40 PM 02/28/2024   11:25 AM  CBC  WBC 4.0 - 10.5 K/uL 5.2  7.1  6.3  Hemoglobin 12.0 - 15.0 g/dL 88.5  86.4  88.1   Hematocrit 36.0 - 46.0 % 35.7  41.2  35.9    Platelets 150 - 400 K/uL 60  68  61        Latest Ref Rng & Units 04/29/2024   11:23 AM 03/29/2024   12:40 PM 02/28/2024   11:25 AM  BMP  Glucose 70 - 99 mg/dL 845  757  884   BUN 8 - 23 mg/dL 23  36  30   Creatinine 0.44 - 1.00 mg/dL 8.92  8.78  8.99   Sodium 135 - 145 mmol/L 134  131  134   Potassium 3.5 - 5.1 mmol/L 5.4  5.4  4.9   Chloride 98 - 111 mmol/L 104  100  104   CO2 22 - 32 mmol/L 24  24  23    Calcium  8.9 - 10.3 mg/dL 8.9  9.5  9.3       Latest Ref Rng & Units 04/29/2024   11:23 AM 03/29/2024   12:40 PM 02/28/2024   11:25 AM  CMP  Glucose 70 - 99 mg/dL 845  757  884   BUN 8 - 23 mg/dL 23  36  30   Creatinine 0.44 - 1.00 mg/dL 8.92  8.78  8.99   Sodium 135 - 145 mmol/L 134  131  134   Potassium 3.5 - 5.1 mmol/L 5.4  5.4  4.9   Chloride 98 - 111 mmol/L 104  100  104   CO2 22 - 32 mmol/L 24  24  23    Calcium  8.9 - 10.3 mg/dL 8.9  9.5  9.3   Total Protein 6.5 - 8.1 g/dL 6.5  7.4  6.9   Total Bilirubin 0.0 - 1.2 mg/dL 0.6  0.6  0.5   Alkaline Phos 38 - 126 U/L 85  98  82   AST 15 - 41 U/L 20  20  16    ALT 0 - 44 U/L 25  27  26      Lab Results  Component Value Date   CHOL 125 10/18/2021   HDL 43 10/18/2021   LDLCALC 65 10/18/2021   TRIG 86 10/18/2021   No results for input(s): LIPOA in the last 8760 hours. No components found for: NTPROBNP No results for input(s): PROBNP in the last 8760 hours. No results for input(s): TSH in the last 8760 hours.  Physical Exam:    Today's Vitals   05/08/24 1513  BP: 112/71  Pulse: 94  Resp: 16  SpO2: 98%  Weight: 220 lb 6.4 oz (100 kg)  Height: 5' 4 (1.626 m)   No data found.   Body mass index is 37.83 kg/m. Wt Readings from Last 3 Encounters:  05/08/24 220 lb 6.4 oz (100 kg)  04/29/24 225 lb 6.4 oz (102.2 kg)  03/29/24 216 lb 4 oz (98.1 kg)    Physical Exam  Constitutional: No distress.  Age appropriate, hemodynamically stable.   Neck: No JVD present.  Cardiovascular: Normal rate, regular  rhythm, S1 normal, S2 normal, intact distal pulses and normal pulses. Exam reveals no gallop, no S3 and no S4.  No murmur heard. Pulses:      Carotid pulses are  on the right side with bruit and  on the left side with bruit. Pulmonary/Chest: Effort normal and breath sounds normal. No stridor. She has no wheezes. She has no rales.  Abdominal: Soft. Bowel sounds are normal. She exhibits no distension. There is no abdominal tenderness.  Musculoskeletal:        General: No edema.     Cervical back: Neck supple.  Neurological: She is alert and oriented to person, place, and time. She has intact cranial nerves (2-12).  Skin: Skin is warm and moist.     Impression & Recommendation(s):  Impression:   ICD-10-CM   1. Asymptomatic bilateral carotid artery stenosis  I65.23 VAS US  CAROTID    2. Essential hypertension  I10     3. Diastolic dysfunction  I51.89     4. Sleep apnea in adult  G47.30        Recommendation(s):  Asymptomatic bilateral carotid artery stenosis Chronic and stable. May 2023 noted bilateral ICA disease <70%. Repeat duplex in July 2024 noted minimal disease involving the right ICA and <50% in the left ICA. States that she has stopped ASA 81 mg po qday due to nose bleed.  Continue lipid-lowering agents. Follow up carotid duplex in October 2025.   Essential hypertension Office and home BP are well controlled.  Continue Jardiance  10 mg po qday  Continue lasix  20 mg po qday  Continue Aldactone  25 mg po qday Continue Avapro  300 mg po qday  Diastolic dysfunction Stage B, NYHA class II Echo July 2024: Preserved LVEF, grade 1 diastolic dysfunction. Up titration of GDMT has significantly improved her symptoms of dyspnea exertion.   However due to lightheaded and dizziness down titration of medical therapy for now. Monitor for now  Sleep apnea in adult Reemphasized importance of CPAP compliance. Follows w/ Dr. Bedelia  Orders Placed:  No orders of the defined types  were placed in this encounter.  Final Medication List:   No orders of the defined types were placed in this encounter.   Medications Discontinued During This Encounter  Medication Reason   aspirin  EC 81 MG tablet Patient Preference   spironolactone  (ALDACTONE ) 50 MG tablet Dose change     Current Outpatient Medications:    acetic acid  2 % otic solution, Place 4 drops into the left ear 2 (two) times daily., Disp: 15 mL, Rfl: 1   albuterol  (VENTOLIN  HFA) 108 (90 Base) MCG/ACT inhaler, Inhale 1-2 puffs into the lungs every 6 (six) hours as needed (Cough)., Disp: 18 g, Rfl: 0   albuterol  (VENTOLIN  HFA) 108 (90 Base) MCG/ACT inhaler, Inhale 1-2 puffs into the lungs every 6 (six) hours as needed and as directed for shortness of breath., Disp: 6.7 g, Rfl: 4   b complex vitamins capsule, Take 1 capsule by mouth in the morning., Disp: , Rfl:    bacitracin  500 UNIT/GM ointment, Apply topically 2 (two) times daily for 10 days, Disp: 30 g, Rfl: 0   budesonide -formoterol  (SYMBICORT ) 80-4.5 MCG/ACT inhaler, Inhale 2 puffs into the lungs daily., Disp: 10.2 g, Rfl: 12   Carboxymethylcellul-Glycerin (REFRESH OPTIVE PF OP), Place 1 drop into both eyes 3 (three) times daily as needed (dry/irritated eyes.). Non-pres, Disp: , Rfl:    cephALEXin  (KEFLEX ) 500 MG capsule, Take 2 capsules (1,000 mg total) by mouth as directed 1 hour prior to dental work., Disp: 20 capsule, Rfl: 0   Coenzyme Q10 (CO Q-10) 100 MG CAPS, Take 100 mg by mouth in the morning., Disp: , Rfl:    Continuous Glucose Sensor (FREESTYLE LIBRE 2 SENSOR) MISC, Replace every 14 days and use to check blood sugar continuously., Disp: 2 each, Rfl: 3   Continuous Glucose Sensor (FREESTYLE LIBRE 2 SENSOR) MISC, Replace every 14 days and use to check blood sugar continuously., Disp: 6 each,  Rfl: 3   desoximetasone  (TOPICORT ) 0.25 % cream, Apply a small amount to skin twice a day as needed for flares, Disp: 60 g, Rfl: 0   diclofenac  Sodium (VOLTAREN   ARTHRITIS PAIN) 1 % GEL, Apply topically 4 (four) times daily to bilateral knees and ankles, Disp: 200 g, Rfl: 5   diclofenac  sodium (VOLTAREN ) 1 % GEL, Apply 2 g topically 2 (two) times daily as needed (knee pain/ foot /Triger finger)., Disp: , Rfl: 2   empagliflozin  (JARDIANCE ) 10 MG TABS tablet, Take 1 tablet (10 mg total) by mouth daily., Disp: 90 tablet, Rfl: 3   fluticasone  (FLONASE ) 50 MCG/ACT nasal spray, Place 2 sprays into both nostrils in the morning., Disp: 48 g, Rfl: 3   furosemide  (LASIX ) 20 MG tablet, Take 1 tablet (20 mg total) by mouth as needed. Take if you gain 1 lb in 24 hours, or 3 lbs in 1 week. Take it daily and stop when weight back to baseline., Disp: 30 tablet, Rfl: 6   gabapentin  (NEURONTIN ) 300 MG capsule, Take 2 capsules (600 mg total) by mouth at bedtime., Disp: 180 capsule, Rfl: 3   glucose blood (ONETOUCH VERIO) test strip, Use as directed to check blood sugar 3 times a day., Disp: 300 strip, Rfl: 3   glucose blood test strip, Use to test blood sugar 3 times daily as directed, Disp: 300 each, Rfl: 3   hydrocortisone  (ANUSOL -HC) 25 MG suppository, Place 1 suppository (25 mg total) rectally daily., Disp: 30 suppository, Rfl: 0   influenza vaccine adjuvanted (FLUAD ) 0.5 ML injection, Inject into the muscle., Disp: 0.5 mL, Rfl: 0   insulin  aspart (NOVOLOG  FLEXPEN) 100 UNIT/ML FlexPen, Inject 30 Units into the skin daily with breakfast AND 30 Units daily with lunch AND 30 Units daily with supper., Disp: 45 mL, Rfl: 3   insulin  aspart (NOVOLOG  FLEXPEN) 100 UNIT/ML FlexPen, Inject 30 Units into the skin daily with breakfast AND 30 Units daily with lunch AND 30 Units daily with supper., Disp: 90 mL, Rfl: 3   insulin  aspart (NOVOLOG  FLEXPEN) 100 UNIT/ML FlexPen, Inject 70 units under the skin with meals 3 times a day (add ISF 10, target 200 - up to 300 units/day), Disp: 65 mL, Rfl: 3   insulin  degludec (TRESIBA  FLEXTOUCH) 200 UNIT/ML FlexTouch Pen, Inject 80 Units into the skin  daily., Disp: 30 mL, Rfl: 5   insulin  degludec (TRESIBA  FLEXTOUCH) 200 UNIT/ML FlexTouch Pen, Inject 92 Units into the skin once daily., Disp: 42 mL, Rfl: 3   Insulin  Pen Needle 32G X 4 MM MISC, Use as directed 5 (five) times daily., Disp: 400 each, Rfl: 3   irbesartan  (AVAPRO ) 300 MG tablet, Take 1 tablet (300 mg total) by mouth every evening., Disp: , Rfl:    irbesartan  (AVAPRO ) 300 MG tablet, Take 1 tablet (300 mg total) by mouth in the morning., Disp: 90 tablet, Rfl: 3   irbesartan  (AVAPRO ) 300 MG tablet, Take 1 tablet (300 mg total) by mouth every evening., Disp: 90 tablet, Rfl: 3   Lancets (ONETOUCH DELICA PLUS LANCET33G) MISC, Use to test blood sugar 3 times daily, Disp: 300 each, Rfl: 2   LORazepam  (ATIVAN ) 0.5 MG tablet, Take 1 or 2 before MRI to reduce claustrophobia, Disp: 2 tablet, Rfl: 0   omeprazole  (PRILOSEC) 40 MG capsule, Take 1 capsule (40 mg total) by mouth 2 (two) times daily., Disp: 180 capsule, Rfl: 3   ondansetron  (ZOFRAN -ODT) 4 MG disintegrating tablet, Dissolve 1 tablet (4 mg) by mouth every 8  hours as needed for nausea or vomiting., Disp: 20 tablet, Rfl: 0   prednisoLONE  acetate (PRED FORTE ) 1 % ophthalmic suspension, Place 1 drop into both eyes 4 (four) times daily., Disp: 10 mL, Rfl: 0   prednisoLONE  acetate (PREDNISOLONE  ACETATE P-F) 1 % ophthalmic suspension, Place 1 drop into both eyes 4 (four) times daily., Disp: 5 mL, Rfl: 0   rosuvastatin  (CRESTOR ) 10 MG tablet, Take 1 tablet (10 mg total) by mouth daily., Disp: 90 tablet, Rfl: 3   sertraline  (ZOLOFT ) 50 MG tablet, Take 1 tablet (50 mg total) by mouth daily., Disp: 90 tablet, Rfl: 3   spironolactone  (ALDACTONE ) 25 MG tablet, Take 25 mg by mouth daily., Disp: , Rfl:    terconazole (TERAZOL 3) 0.8 % vaginal cream, Place 1 applicator vaginally daily as needed (yeast infections)., Disp: , Rfl:    venetoclax  (VENCLEXTA ) 100 MG tablet, Take 1 tablet (100 mg total) by mouth daily. Tablets should be swallowed whole with a  meal and a full glass of water ., Disp: 30 tablet, Rfl: 3   Zoster Vaccine Adjuvanted (SHINGRIX ) injection, Inject into the muscle., Disp: 1 each, Rfl: 1   azelastine  (ASTELIN ) 0.1 % nasal spray, Place 1 spray into both nostrils 2 (two) times daily. Use in each nostril as directed, Disp: 9 mL, Rfl: 11   azithromycin  (ZITHROMAX ) 250 MG tablet, Take 2 tablets first day and then one tablet for the next four days., Disp: 6 tablet, Rfl: 0  Consent:   NA  Disposition:   November 2025  Her questions and concerns were addressed to her satisfaction. She voices understanding of the recommendations provided during this encounter.    Signed, Janice Michele HAS, [facility]  A Division of [facility] [address] [place], [place] [address]

## 2024-05-08 NOTE — Patient Instructions (Signed)
 Medication Instructions:  No medication changes were made at this visit. Continue current regimen.   *If you need a refill on your cardiac medications before your next appointment, please call your pharmacy*  Lab Work: NONE If you have labs (blood work) drawn today and your tests are completely normal, you will receive your results only by: MyChart Message (if you have MyChart) OR A paper copy in the mail If you have any lab test that is abnormal or we need to change your treatment, we will call you to review the results.  Testing/Procedures: Carotid Duplex  Your physician has requested that you have a carotid duplex. This test is an ultrasound of the carotid arteries in your neck. It looks at blood flow through these arteries that supply the brain with blood. Allow one hour for this exam. There are no restrictions or special instructions.   Follow-Up: At [facility], you and your health needs are our priority.  As part of our continuing mission to provide you with exceptional heart care, our providers are all part of one team.  This team includes your primary Cardiologist (physician) and Advanced Practice Providers or APPs (Physician Assistants and Nurse Practitioners) who all work together to provide you with the care you need, when you need it.  Your next appointment:   1 Year  Provider:   Madonna Large, DO    We recommend signing up for the patient portal called MyChart.  Sign up information is provided on this After Visit Summary.  MyChart is used to connect with patients for Virtual Visits (Telemedicine).  Patients are able to view lab/test results, encounter notes, upcoming appointments, etc.  Non-urgent messages can be sent to your provider as well.   To learn more about what you can do with MyChart, go to [email or web address].

## 2024-05-08 NOTE — Telephone Encounter (Addendum)
 Procedure:Endoscopy Procedure date: 05/16/24 Procedure location: [facility] Arrival Time: 10:15 am Spoke with the patient Y/N: Yes Any prep concerns? No  Has the patient obtained the prep from the pharmacy ? No prep needed Do you have a care partner and transportation: Yes Any additional concerns? No

## 2024-05-09 ENCOUNTER — Telehealth: Payer: Self-pay | Admitting: Cardiology

## 2024-05-09 ENCOUNTER — Encounter (HOSPITAL_COMMUNITY): Payer: Self-pay | Admitting: Internal Medicine

## 2024-05-09 NOTE — Telephone Encounter (Signed)
 Patient reported that she canceled her Carotid test on 9/11 as she already has a Carotid test scheduled on 10/14.

## 2024-05-09 NOTE — Telephone Encounter (Signed)
 Left message for patient okay for carotids in October.

## 2024-05-13 ENCOUNTER — Encounter: Payer: Self-pay | Admitting: Cardiology

## 2024-05-13 ENCOUNTER — Other Ambulatory Visit: Payer: Self-pay

## 2024-05-14 ENCOUNTER — Other Ambulatory Visit: Payer: Self-pay

## 2024-05-15 ENCOUNTER — Other Ambulatory Visit: Payer: Self-pay

## 2024-05-16 ENCOUNTER — Ambulatory Visit (HOSPITAL_COMMUNITY): Admitting: Anesthesiology

## 2024-05-16 ENCOUNTER — Ambulatory Visit (HOSPITAL_COMMUNITY)
Admission: RE | Admit: 2024-05-16 | Discharge: 2024-05-16 | Disposition: A | Discharge status: Home / Self Care | Attending: Internal Medicine | Admitting: Internal Medicine

## 2024-05-16 ENCOUNTER — Encounter (HOSPITAL_COMMUNITY): Payer: Self-pay | Admitting: Internal Medicine

## 2024-05-16 ENCOUNTER — Ambulatory Visit (HOSPITAL_BASED_OUTPATIENT_CLINIC_OR_DEPARTMENT_OTHER): Admitting: Anesthesiology

## 2024-05-16 ENCOUNTER — Encounter (HOSPITAL_COMMUNITY)
Admission: RE | Disposition: A | Discharge status: Home / Self Care | Payer: Self-pay | Source: Home / Self Care | Attending: Internal Medicine

## 2024-05-16 ENCOUNTER — Other Ambulatory Visit: Payer: Self-pay

## 2024-05-16 DIAGNOSIS — I8500 Esophageal varices without bleeding: Secondary | ICD-10-CM

## 2024-05-16 DIAGNOSIS — E1122 Type 2 diabetes mellitus with diabetic chronic kidney disease: Secondary | ICD-10-CM | POA: Insufficient documentation

## 2024-05-16 DIAGNOSIS — E669 Obesity, unspecified: Secondary | ICD-10-CM | POA: Insufficient documentation

## 2024-05-16 DIAGNOSIS — N181 Chronic kidney disease, stage 1: Secondary | ICD-10-CM | POA: Insufficient documentation

## 2024-05-16 DIAGNOSIS — K766 Portal hypertension: Secondary | ICD-10-CM | POA: Diagnosis not present

## 2024-05-16 DIAGNOSIS — Z794 Long term (current) use of insulin: Secondary | ICD-10-CM | POA: Diagnosis not present

## 2024-05-16 DIAGNOSIS — K7460 Unspecified cirrhosis of liver: Secondary | ICD-10-CM

## 2024-05-16 DIAGNOSIS — I2510 Atherosclerotic heart disease of native coronary artery without angina pectoris: Secondary | ICD-10-CM | POA: Diagnosis not present

## 2024-05-16 DIAGNOSIS — I8510 Secondary esophageal varices without bleeding: Secondary | ICD-10-CM | POA: Insufficient documentation

## 2024-05-16 DIAGNOSIS — Z7984 Long term (current) use of oral hypoglycemic drugs: Secondary | ICD-10-CM | POA: Diagnosis not present

## 2024-05-16 DIAGNOSIS — Z6837 Body mass index (BMI) 37.0-37.9, adult: Secondary | ICD-10-CM | POA: Insufficient documentation

## 2024-05-16 DIAGNOSIS — K3189 Other diseases of stomach and duodenum: Secondary | ICD-10-CM | POA: Insufficient documentation

## 2024-05-16 DIAGNOSIS — G4733 Obstructive sleep apnea (adult) (pediatric): Secondary | ICD-10-CM | POA: Insufficient documentation

## 2024-05-16 DIAGNOSIS — K317 Polyp of stomach and duodenum: Secondary | ICD-10-CM | POA: Diagnosis not present

## 2024-05-16 DIAGNOSIS — K7581 Nonalcoholic steatohepatitis (NASH): Secondary | ICD-10-CM | POA: Insufficient documentation

## 2024-05-16 DIAGNOSIS — Z09 Encounter for follow-up examination after completed treatment for conditions other than malignant neoplasm: Secondary | ICD-10-CM | POA: Insufficient documentation

## 2024-05-16 DIAGNOSIS — I129 Hypertensive chronic kidney disease with stage 1 through stage 4 chronic kidney disease, or unspecified chronic kidney disease: Secondary | ICD-10-CM | POA: Diagnosis not present

## 2024-05-16 HISTORY — PX: ESOPHAGOGASTRODUODENOSCOPY: SHX5428

## 2024-05-16 LAB — GLUCOSE, CAPILLARY
Glucose-Capillary: 101 mg/dL — ABNORMAL HIGH (ref 70–99)
Glucose-Capillary: 119 mg/dL — ABNORMAL HIGH (ref 70–99)

## 2024-05-16 SURGERY — EGD (ESOPHAGOGASTRODUODENOSCOPY)
Anesthesia: Monitor Anesthesia Care

## 2024-05-16 MED ORDER — SODIUM CHLORIDE 0.9 % IV SOLN
INTRAVENOUS | Status: AC | PRN
  Administered 2024-05-16: 500 mL via INTRAVENOUS

## 2024-05-16 MED ORDER — PROPOFOL 500 MG/50ML IV EMUL
INTRAVENOUS | Status: DC | PRN
  Administered 2024-05-16: 125 ug/kg/min via INTRAVENOUS

## 2024-05-16 MED ORDER — ONDANSETRON HCL 4 MG/2ML IJ SOLN
INTRAMUSCULAR | Status: DC | PRN
  Administered 2024-05-16: 4 mg via INTRAVENOUS

## 2024-05-16 MED ORDER — PROPOFOL 10 MG/ML IV BOLUS
INTRAVENOUS | Status: DC | PRN
  Administered 2024-05-16: 100 mg via INTRAVENOUS

## 2024-05-16 MED ORDER — LIDOCAINE 2% (20 MG/ML) 5 ML SYRINGE
INTRAMUSCULAR | Status: DC | PRN
  Administered 2024-05-16: 60 mg via INTRAVENOUS

## 2024-05-16 NOTE — Transfer of Care (Signed)
 Immediate Anesthesia Transfer of Care Note  Patient: Janice [name]  Procedure(s) Performed: EGD (ESOPHAGOGASTRODUODENOSCOPY)  Patient Location: PACU  Anesthesia Type:MAC  Level of Consciousness: drowsy and patient cooperative  Airway & Oxygen  Therapy: Patient Spontanous Breathing  Post-op Assessment: Report given to RN and Post -op Vital signs reviewed and stable  Post vital signs: Reviewed and stable  Last Vitals:  Vitals Value Taken Time  BP 110/60 05/16/24 08:57  Temp    Pulse 65  05/16/24 08:57  Resp 16 05/16/24 08:57  SpO2 98% on RA 05/16/24 08:57  Vitals shown include unfiled device data.  Last Pain:  Vitals:   05/16/24 0720  TempSrc: Temporal  PainSc: 0-No pain      Patients Stated Pain Goal: 0 (05/16/24 0720)  Complications: No notable events documented.

## 2024-05-16 NOTE — Discharge Instructions (Addendum)
 The esophageal varices remain small.  There were some more stomach nodules/polyps - I  biopsied them.  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  I will let you know results and recommendations.  I am going to ask your cardiologist if we can start a medicine called carvedilol that can reduce complications of cirrhosis.  I appreciate the opportunity to care for you. Lupita CHARLENA Commander, MD, FACG  YOU HAD AN ENDOSCOPIC PROCEDURE TODAY: Refer to the procedure report and other information in the discharge instructions given to you for any specific questions about what was found during the examination. If this information does not answer your questions, please call Dr. Darilyn office at [phone] to clarify.   YOU SHOULD EXPECT: Some feelings of bloating in the abdomen. Passage of more gas than usual. Walking can help get rid of the air that was put into your GI tract during the procedure and reduce the bloating. If you had a lower endoscopy (such as a colonoscopy or flexible sigmoidoscopy) you may notice spotting of blood in your stool or on the toilet paper. Some abdominal soreness may be present for a day or two, also.  DIET: Your first meal following the procedure should be a light meal and then it is ok to progress to your normal diet. A half-sandwich or bowl of soup is an example of a good first meal. Heavy or fried foods are harder to digest and may make you feel nauseous or bloated. Drink plenty of fluids but you should avoid alcoholic beverages for 24 hours.   ACTIVITY: Your care partner should take you home directly after the procedure. You should plan to take it easy, moving slowly for the rest of the day. You can resume normal activity the day after the procedure however YOU SHOULD NOT DRIVE, use power tools, machinery or perform tasks that involve climbing or major physical exertion for 24 hours (because of the sedation medicines used during the test).    SYMPTOMS TO REPORT IMMEDIATELY: A gastroenterologist can be reached at any hour. Please call [phone]  for any of the following symptoms:   Following upper endoscopy (EGD, EUS, ERCP, esophageal dilation) Vomiting of blood or coffee ground material  New, significant abdominal pain  New, significant chest pain or pain under the shoulder blades  Painful or persistently difficult swallowing  New shortness of breath  Black, tarry-looking or red, bloody stools  FOLLOW UP:  If any biopsies were taken you will be contacted by phone or by letter within the next 1-3 weeks. Call [phone]  if you have not heard about the biopsies in 3 weeks.  Please also call with any specific questions about appointments or follow up tests.

## 2024-05-16 NOTE — Anesthesia Preprocedure Evaluation (Signed)
 Anesthesia Evaluation  Patient identified by MRN, date of birth, ID band Patient awake    Reviewed: Allergy & Precautions, NPO status , Patient's Chart, lab work & pertinent test results  History of Anesthesia Complications (+) DIFFICULT AIRWAY and history of anesthetic complications  Airway Mallampati: III  TM Distance: >3 FB Neck ROM: Full    Dental  (+) Dental Advisory Given, Chipped, Caps,    Pulmonary asthma , sleep apnea and Continuous Positive Airway Pressure Ventilation    Pulmonary exam normal breath sounds clear to auscultation       Cardiovascular hypertension, Pt. on medications + CAD  Normal cardiovascular exam Rhythm:Regular Rate:Normal     Neuro/Psych  Headaches PSYCHIATRIC DISORDERS  Depression     Neuromuscular disease    GI/Hepatic ,GERD  Medicated,,(+) Cirrhosis   Esophageal Varices      Endo/Other  diabetes, Type 2, Insulin  Dependent, Oral Hypoglycemic Agents  Obesity   Renal/GU Renal InsufficiencyRenal disease     Musculoskeletal  (+) Arthritis ,    Abdominal   Peds  Hematology negative hematology ROS (+)   Anesthesia Other Findings Day of surgery medications reviewed with the patient.  Reproductive/Obstetrics                              Anesthesia Physical Anesthesia Plan  ASA: 3  Anesthesia Plan: MAC   Post-op Pain Management: Minimal or no pain anticipated   Induction: Intravenous  PONV Risk Score and Plan: 2 and TIVA and Treatment may vary due to age or medical condition  Airway Management Planned: Simple Face Mask and Natural Airway  Additional Equipment:   Intra-op Plan:   Post-operative Plan:   Informed Consent: I have reviewed the patients History and Physical, chart, labs and discussed the procedure including the risks, benefits and alternatives for the proposed anesthesia with the patient or authorized representative who has indicated  his/her understanding and acceptance.     Dental advisory given  Plan Discussed with: CRNA  Anesthesia Plan Comments:         Anesthesia Quick Evaluation

## 2024-05-16 NOTE — Anesthesia Postprocedure Evaluation (Signed)
 Anesthesia Post Note  Patient: Janice [name]  Procedure(s) Performed: EGD (ESOPHAGOGASTRODUODENOSCOPY)     Patient location during evaluation: Endoscopy Anesthesia Type: MAC Level of consciousness: oriented, awake and alert and awake Pain management: pain level controlled Vital Signs Assessment: post-procedure vital signs reviewed and stable Respiratory status: spontaneous breathing, nonlabored ventilation, respiratory function stable and patient connected to nasal cannula oxygen  Cardiovascular status: blood pressure returned to baseline and stable Postop Assessment: no headache, no backache and no apparent nausea or vomiting Anesthetic complications: no   No notable events documented.  Last Vitals:  Vitals:   05/16/24 0920 05/16/24 0925  BP: 132/62   Pulse: (!) 53 76  Resp: 12 11  Temp:    SpO2: 98% 100%    Last Pain:  Vitals:   05/16/24 0920  TempSrc:   PainSc: 0-No pain                 Garnette FORBES Skillern

## 2024-05-16 NOTE — Op Note (Signed)
 [address] [facility] Patient Name: Janice [name] Procedure Date: 05/16/2024 MRN: [id number] Attending MD: Lupita FORBES Commander , MD, [id number] Date of Birth: [birth year] CSN: [id number] Age: 73 Admit Type: Outpatient Procedure:                Upper GI endoscopy Indications:              Esophageal varices, Follow-up of esophageal varices                            Gr 1 esophageal varices and portal gastropathy last                            EGD 02/2023 Providers:                Lupita CHARLENA Commander, MD, Ozell Pouch, Corky Czech, Technician Referring MD:              Medicines:                Monitored Anesthesia Care Complications:            No immediate complications. Estimated Blood Loss:     Estimated blood loss was minimal. Procedure:                Pre-Anesthesia Assessment:                           - Prior to the procedure, a History and Physical                            was performed, and patient medications and                            allergies were reviewed. The patient's tolerance of                            previous anesthesia was also reviewed. The risks                            and benefits of the procedure and the sedation                            options and risks were discussed with the patient.                            All questions were answered, and informed consent                            was obtained. Prior Anticoagulants: The patient has                            taken no anticoagulant or antiplatelet agents. ASA  Grade Assessment: III - A patient with severe                            systemic disease. After reviewing the risks and                            benefits, the patient was deemed in satisfactory                            condition to undergo the procedure.                           After obtaining informed consent, the endoscope was                            passed under direct  vision. Throughout the                            procedure, the patient's blood pressure, pulse, and                            oxygen  saturations were monitored continuously. The                            [id number]-H190 ([id number]) Olympus endoscope was introduced                            through the mouth, and advanced to the second part                            of duodenum. The upper GI endoscopy was                            accomplished without difficulty. The patient                            tolerated the procedure well. Scope In: Scope Out: Findings:      Grade I (trace)varices were found in the distal esophagus. They were 4       mm in largest diameter.      Patchy nodular vs polypoid mucosa was found in the gastric body and in       the gastric antrum. Biopsies were taken with a cold forceps for       histology. Verification of patient identification for the specimen was       done. Estimated blood loss was minimal.      Moderate portal hypertensive gastropathy was found in the entire       examined stomach.      The exam was otherwise without abnormality.      The cardia and gastric fundus were otherwise normal on retroflexion. Impression:               - Grade I esophageal varices. Trace                           - Nodular vs  polypoid mucosa (friable, inflamed) in                            the gastric body and in the gastric antrum.                            Biopsied the body changes as these were new. Antrum                            looks similar to before and biopsies have been                            non-diagnostic. Suspect this is part of portal                            gastropathy.                           - Portal hypertensive gastropathy. Moderate,                            slightly friable.                           - The examination was otherwise normal. Moderate Sedation:      Not Applicable - Patient had care per Anesthesia. Recommendation:            - Patient has a contact number available for                            emergencies. The signs and symptoms of potential                            delayed complications were discussed with the                            patient. Return to normal activities tomorrow.                            Written discharge instructions were provided to the                            patient.                           - Resume previous diet.                           - Continue present medications.                           - Await pathology results.                           - Repeat upper endoscopy in 1 year for  surveillance. Unless we can start carvedilol - I                            will query cardiolgy about that. Procedure Code(s):        --- Professional ---                           [phone], Esophagogastroduodenoscopy, flexible,                            transoral; with biopsy, single or multiple Diagnosis Code(s):        --- Professional ---                           I85.00, Esophageal varices without bleeding                           K31.89, Other diseases of stomach and duodenum                           K76.6, Portal hypertension CPT copyright 2022 American Medical Association. All rights reserved. The codes documented in this report are preliminary and upon coder review may  be revised to meet current compliance requirements. Lupita FORBES Commander, MD 05/16/2024 9:02:25 AM This report has been signed electronically. Number of Addenda: 0

## 2024-05-16 NOTE — H&P (Addendum)
 [facility] History and Physical   Primary Care Physician:  Avva, Ravisankar, MD   Reason for Procedure:   Cirrhosis, f/u varices  Plan:    EGD     HPI: Janice [name] is a 73 y.o. 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 She has a difficult airway.   Past Medical History:  Diagnosis Date   Allergy    Asthma    seasonal   Cellulitis 2013   Left toe   Chronic kidney disease    stage 1   Cirrhosis (HCC)    Common migraine    History of   Complication of anesthesia    Per pt/had breathing problems with block during rotator cuff surgery. 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   Coronary artery disease    DDD (degenerative disc disease)    Depression    denies takes paxil for migraines   Diabetes mellitus    type 2   Diabetic peripheral neuropathy (HCC)    Difficult airway for intubation 10/20/2022   DLx 1 with MAC 4, unable to see cords, DL with lopro S3 glidescope, easy atraumatic intubation   Difficult intubation 03/03/2023   Diverticulitis    Diverticulosis    DJD (degenerative joint disease)    Esophageal varices (HCC)    GERD (gastroesophageal reflux disease)    History of colon polyps    hyperplastic   History of gastric polyp    Hyperlipidemia    Hypertension    Iron  deficiency anemia    Obesity    OSA on CPAP    cpap   Peripheral neuropathy    Pneumonia    april 2020  mild   Primary localized osteoarthritis of left knee 03/27/2019   Sensorineural hearing loss    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HCC)     Past Surgical History:  Procedure Laterality Date   ANKLE SURGERY     Left    BREAST BIOPSY Left 2021   left axilla, lymphoma   COLONOSCOPY  05/2018   ESOPHAGOGASTRODUODENOSCOPY (EGD) WITH PROPOFOL  N/A 03/09/2023   Procedure: ESOPHAGOGASTRODUODENOSCOPY (EGD) WITH PROPOFOL ;  Surgeon: Avram Lupita BRAVO, MD;  Location: [facility];  Service: Gastroenterology;  Laterality: N/A;   LEEP N/A 09/14/2018    Procedure: LOOP ELECTROSURGICAL EXCISION PROCEDURE (LEEP);  Surgeon: Leva Rush, MD;  Location: [facility];  Service: Gynecology;  Laterality: N/A;   MASS EXCISION Right 06/24/2021   Procedure: EXCISION RIGHT POSTERIOR NECK MASS;  Surgeon: Kinsinger, Herlene Righter, MD;  Location: [facility];  Service: General;  Laterality: Right;   ROTATOR CUFF REPAIR Bilateral 2012, 2015   TONSILLECTOMY     TOTAL KNEE ARTHROPLASTY Left 04/08/2019   Procedure: TOTAL KNEE ARTHROPLASTY;  Surgeon: Jane Charleston, MD;  Location: [facility];  Service: Orthopedics;  Laterality: Left;   UMBILICAL HERNIA REPAIR N/A 10/20/2022   Procedure: OPEN HERNIA REPAIR UMBILICAL ADULT with Mesh;  Surgeon: Kinsinger, Herlene Righter, MD;  Location: [facility];  Service: General;  Laterality: N/A;   UPPER GI ENDOSCOPY  05/2018     Current Facility-Administered Medications  Medication Dose Route Frequency Provider Last Rate Last Admin   0.9 %  sodium chloride  infusion    Continuous PRN Avram Lupita BRAVO, MD 20 mL/hr at 05/16/24 0722 500 mL at 05/16/24 0722    Allergies as of 03/27/2024 - Review Complete 03/21/2024  Allergen Reaction Noted   Breo ellipta [fluticasone  furoate-vilanterol] Anaphylaxis and Hives 02/01/2019   Gazyva  [obinutuzumab ] Shortness Of Breath 04/27/2023  Iodine  Hives, Swelling, and Other (See Comments) 07/19/2016   Latex Hives, Itching, Rash, and Other (See Comments) 09/28/2013   Tessalon  [benzonatate ] Anaphylaxis, Hives, and Other (See Comments) 12/06/2016   Zocor [simvastatin] Other (See Comments) 01/12/2013   Levaquin [levofloxacin] Other (See Comments) 12/30/2013   Penicillins Hives 01/27/2020   Povidone-iodine  Hives 06/30/2016   Wellbutrin [bupropion] Other (See Comments) 01/12/2013   Amoxicillin Hives 08/27/2018   Chlorhexidine  Itching 10/21/2022   Diflucan [fluconazole] Hives and Other (See Comments) 08/27/2018   Thalitone  [chlorthalidone ] Other (See Comments) 03/09/2020   Amoxicillin-pot  clavulanate Rash 02/03/2021   Vibra-tab [doxycycline] Diarrhea and Itching 12/30/2013    Family History  Problem Relation Age of Onset   Breast cancer Mother    Bone cancer Mother    Heart failure Father    Diabetes Mellitus II Father    Hypertension Father    Diabetes Mellitus II Sister    Obesity Sister    Other Sister        retina problem   Hypertension Brother    Diabetes Mellitus II Brother    Colon cancer Neg Hx    Rectal cancer Neg Hx    Stomach cancer Neg Hx    Esophageal cancer Neg Hx    Pancreatic cancer Neg Hx    Liver disease Neg Hx     Social History   Socioeconomic History   Marital status: Widowed    Spouse name: Not on file   Number of children: 0   Years of education: 3 years college   Highest education level: Not on file  Occupational History   Occupation: Unemployed  Tobacco Use   Smoking status: Never    Passive exposure: Past   Smokeless tobacco: Never  Vaping Use   Vaping status: Never Used  Substance and Sexual Activity   Alcohol  use: Not Currently    Comment: very rare   Drug use: No   Sexual activity: Not Currently    Birth control/protection: None, Post-menopausal  Other Topics Concern   Not on file  Social History Narrative   1 caffeine drink daily    Right-handed   widow (husband died from [place] cirrhosis)   Social Drivers of Health   Financial Resource Strain: Medium Risk (08/20/2020)   Overall Financial Resource Strain (CARDIA)    Difficulty of Paying Living Expenses: Somewhat hard  Food Insecurity: No Food Insecurity (10/20/2022)   Hunger Vital Sign    Worried About Running Out of Food in the Last Year: Never true    Ran Out of Food in the Last Year: Never true  Transportation Needs: No Transportation Needs (10/20/2022)   PRAPARE - Administrator, Civil Service (Medical): No    Lack of Transportation (Non-Medical): No  Physical Activity: Not on file  Stress: Not on file  Social Connections: Unknown (01/25/2022)    Received from [facility]   Social Network    Social Network: Not on file  Intimate Partner Violence: Not At Risk (10/20/2022)   Humiliation, Afraid, Rape, and Kick questionnaire    Fear of Current or Ex-Partner: No    Emotionally Abused: No    Physically Abused: No    Sexually Abused: No    Review of Systems: Positive for Left ear infection seeing Dr. Carlie, still having diarrhea but less All other review of systems negative except as mentioned in the HPI.  Physical Exam: Vital signs BP (!) 148/38   Temp (!) 97.4 F (36.3 C) (Temporal)   Resp  16   Ht 5' 4.5 (1.638 m)   Wt 100.7 kg   SpO2 99%   BMI 37.52 kg/m   General:   Alert,  Well-developed, well-nourished, pleasant and cooperative in NAD Lungs:  Clear throughout to auscultation.   Heart:  Regular rate and rhythm; no murmurs, clicks, rubs,  or gallops. Abdomen:  Soft, nontender and nondistended. Normal bowel sounds.   Neuro/Psych:  Alert and cooperative. Normal mood and affect. A and O x 3   @Sire Poet  CHARLENA Commander, MD, [facility] [phone] (pager) 05/16/2024 8:24 AM@

## 2024-05-19 ENCOUNTER — Encounter (HOSPITAL_COMMUNITY): Payer: Self-pay | Admitting: Internal Medicine

## 2024-05-20 ENCOUNTER — Ambulatory Visit: Payer: Self-pay | Admitting: Internal Medicine

## 2024-05-20 ENCOUNTER — Other Ambulatory Visit (HOSPITAL_COMMUNITY): Payer: Self-pay

## 2024-05-20 ENCOUNTER — Other Ambulatory Visit: Payer: Self-pay

## 2024-05-20 DIAGNOSIS — H9012 Conductive hearing loss, unilateral, left ear, with unrestricted hearing on the contralateral side: Secondary | ICD-10-CM | POA: Diagnosis not present

## 2024-05-20 LAB — SURGICAL PATHOLOGY

## 2024-05-20 MED ORDER — CARVEDILOL 3.125 MG PO TABS
3.1250 mg | ORAL_TABLET | Freq: Two times a day (BID) | ORAL | 0 refills | Status: DC
  Filled 2024-05-20: qty 60, 30d supply, fill #0

## 2024-05-21 DIAGNOSIS — G4733 Obstructive sleep apnea (adult) (pediatric): Secondary | ICD-10-CM | POA: Diagnosis not present

## 2024-05-21 NOTE — Telephone Encounter (Signed)
 OK - please dc Rx and cancel with the pharmacy   Recall EGD 1 year

## 2024-05-22 ENCOUNTER — Other Ambulatory Visit (HOSPITAL_COMMUNITY): Payer: Self-pay

## 2024-05-23 ENCOUNTER — Encounter (HOSPITAL_COMMUNITY)

## 2024-05-24 ENCOUNTER — Other Ambulatory Visit: Payer: Self-pay

## 2024-05-24 DIAGNOSIS — C9110 Chronic lymphocytic leukemia of B-cell type not having achieved remission: Secondary | ICD-10-CM

## 2024-05-27 ENCOUNTER — Inpatient Hospital Stay

## 2024-05-27 ENCOUNTER — Other Ambulatory Visit (HOSPITAL_COMMUNITY): Payer: Self-pay

## 2024-05-27 ENCOUNTER — Inpatient Hospital Stay: Attending: Hematology

## 2024-05-27 VITALS — BP 122/49 | HR 58 | Temp 98.4°F | Resp 20 | Wt 227.5 lb

## 2024-05-27 DIAGNOSIS — R590 Localized enlarged lymph nodes: Secondary | ICD-10-CM | POA: Diagnosis not present

## 2024-05-27 DIAGNOSIS — Z79899 Other long term (current) drug therapy: Secondary | ICD-10-CM | POA: Insufficient documentation

## 2024-05-27 DIAGNOSIS — R188 Other ascites: Secondary | ICD-10-CM | POA: Insufficient documentation

## 2024-05-27 DIAGNOSIS — R161 Splenomegaly, not elsewhere classified: Secondary | ICD-10-CM | POA: Insufficient documentation

## 2024-05-27 DIAGNOSIS — C8300 Small cell B-cell lymphoma, unspecified site: Secondary | ICD-10-CM

## 2024-05-27 DIAGNOSIS — Z7969 Long term (current) use of other immunomodulators and immunosuppressants: Secondary | ICD-10-CM | POA: Diagnosis not present

## 2024-05-27 DIAGNOSIS — Z7951 Long term (current) use of inhaled steroids: Secondary | ICD-10-CM | POA: Insufficient documentation

## 2024-05-27 DIAGNOSIS — K7460 Unspecified cirrhosis of liver: Secondary | ICD-10-CM | POA: Diagnosis not present

## 2024-05-27 DIAGNOSIS — D801 Nonfamilial hypogammaglobulinemia: Secondary | ICD-10-CM | POA: Insufficient documentation

## 2024-05-27 DIAGNOSIS — Z7982 Long term (current) use of aspirin: Secondary | ICD-10-CM | POA: Insufficient documentation

## 2024-05-27 DIAGNOSIS — C9110 Chronic lymphocytic leukemia of B-cell type not having achieved remission: Secondary | ICD-10-CM | POA: Insufficient documentation

## 2024-05-27 DIAGNOSIS — D696 Thrombocytopenia, unspecified: Secondary | ICD-10-CM | POA: Diagnosis not present

## 2024-05-27 DIAGNOSIS — R197 Diarrhea, unspecified: Secondary | ICD-10-CM | POA: Insufficient documentation

## 2024-05-27 LAB — CBC WITH DIFFERENTIAL (CANCER CENTER ONLY)
Abs Immature Granulocytes: 0.02 K/uL (ref 0.00–0.07)
Basophils Absolute: 0 K/uL (ref 0.0–0.1)
Basophils Relative: 1 %
Eosinophils Absolute: 0.1 K/uL (ref 0.0–0.5)
Eosinophils Relative: 2 %
HCT: 37.3 % (ref 36.0–46.0)
Hemoglobin: 11.6 g/dL — ABNORMAL LOW (ref 12.0–15.0)
Immature Granulocytes: 1 %
Lymphocytes Relative: 20 %
Lymphs Abs: 0.8 K/uL (ref 0.7–4.0)
MCH: 25.7 pg — ABNORMAL LOW (ref 26.0–34.0)
MCHC: 31.1 g/dL (ref 30.0–36.0)
MCV: 82.5 fL (ref 80.0–100.0)
Monocytes Absolute: 0.5 K/uL (ref 0.1–1.0)
Monocytes Relative: 11 %
Neutro Abs: 2.7 K/uL (ref 1.7–7.7)
Neutrophils Relative %: 65 %
Platelet Count: 56 K/uL — ABNORMAL LOW (ref 150–400)
RBC: 4.52 MIL/uL (ref 3.87–5.11)
RDW: 15 % (ref 11.5–15.5)
WBC Count: 4.1 K/uL (ref 4.0–10.5)
nRBC: 0 % (ref 0.0–0.2)

## 2024-05-27 LAB — CMP (CANCER CENTER ONLY)
ALT: 23 U/L (ref 0–44)
AST: 19 U/L (ref 15–41)
Albumin: 4.4 g/dL (ref 3.5–5.0)
Alkaline Phosphatase: 85 U/L (ref 38–126)
Anion gap: 6 (ref 5–15)
BUN: 25 mg/dL — ABNORMAL HIGH (ref 8–23)
CO2: 27 mmol/L (ref 22–32)
Calcium: 9 mg/dL (ref 8.9–10.3)
Chloride: 109 mmol/L (ref 98–111)
Creatinine: 0.94 mg/dL (ref 0.44–1.00)
GFR, Estimated: >60 mL/min (ref >60)
Glucose, Bld: 63 mg/dL — ABNORMAL LOW (ref 70–99)
Potassium: 4.2 mmol/L (ref 3.5–5.1)
Sodium: 142 mmol/L (ref 135–145)
Total Bilirubin: 0.5 mg/dL (ref 0.0–1.2)
Total Protein: 6.8 g/dL (ref 6.5–8.1)

## 2024-05-27 LAB — IRON AND IRON BINDING CAPACITY (CC-WL,HP ONLY)
Iron: 39 ug/dL (ref 28–170)
Saturation Ratios: 8 % — ABNORMAL LOW (ref 10.4–31.8)
TIBC: 518 ug/dL — ABNORMAL HIGH (ref 250–450)
UIBC: 479 ug/dL — ABNORMAL HIGH (ref 148–442)

## 2024-05-27 LAB — URIC ACID: Uric Acid, Serum: 7 mg/dL (ref 2.5–7.1)

## 2024-05-27 LAB — LACTATE DEHYDROGENASE: LDH: 155 U/L (ref 98–192)

## 2024-05-27 LAB — FERRITIN: Ferritin: 17 ng/mL (ref 11–307)

## 2024-05-27 MED ORDER — IRON SUCROSE 300 MG IVPB - SIMPLE MED
300.0000 mg | Freq: Once | Status: AC
  Administered 2024-05-27: 300 mg via INTRAVENOUS
  Filled 2024-05-27: qty 200

## 2024-05-27 MED ORDER — ONDANSETRON HCL 4 MG/2ML IJ SOLN
4.0000 mg | Freq: Once | INTRAMUSCULAR | Status: AC
  Administered 2024-05-27: 4 mg via INTRAVENOUS
  Filled 2024-05-27: qty 2

## 2024-05-27 MED ORDER — SODIUM CHLORIDE 0.9 % IV SOLN: INTRAVENOUS | Status: DC

## 2024-05-27 MED ORDER — ACETAMINOPHEN 325 MG PO TABS
650.0000 mg | ORAL_TABLET | Freq: Once | ORAL | Status: AC
  Administered 2024-05-27: 650 mg via ORAL
  Filled 2024-05-27: qty 2

## 2024-05-27 MED ORDER — METHYLPREDNISOLONE SODIUM SUCC 40 MG IJ SOLR
40.0000 mg | Freq: Once | INTRAMUSCULAR | Status: AC
  Administered 2024-05-27: 40 mg via INTRAVENOUS
  Filled 2024-05-27: qty 1

## 2024-05-27 MED ORDER — IMMUNE GLOBULIN (HUMAN) 10 GM/100ML IV SOLN
400.0000 mg/kg | INTRAVENOUS | Status: DC
  Administered 2024-05-27: 0.1 g via INTRAVENOUS
  Filled 2024-05-27: qty 400

## 2024-05-27 MED ORDER — DIPHENHYDRAMINE HCL 25 MG PO CAPS
25.0000 mg | ORAL_CAPSULE | Freq: Once | ORAL | Status: AC
  Administered 2024-05-27: 25 mg via ORAL
  Filled 2024-05-27: qty 1

## 2024-05-27 NOTE — Telephone Encounter (Signed)
 Patient requesting f/u call in regards to next plan of care. Please  advise.

## 2024-05-28 ENCOUNTER — Encounter: Payer: Self-pay | Admitting: Hematology

## 2024-05-29 ENCOUNTER — Encounter

## 2024-05-29 ENCOUNTER — Telehealth: Payer: Self-pay

## 2024-05-29 MED ORDER — CARVEDILOL 3.125 MG PO TABS
3.1250 mg | ORAL_TABLET | Freq: Two times a day (BID) | ORAL | Status: DC
Start: 2024-05-29 — End: 2024-06-09

## 2024-05-29 NOTE — Telephone Encounter (Signed)
 Vitals look good and do not explain the dizziness but since she feels dizzy with the medication I would like her to stop it and see if the dizziness stops and have her let me know.

## 2024-05-29 NOTE — Telephone Encounter (Signed)
 Patient came to the office today for a BP check after staring carvedilol  3.125mg  one tablet two times daily.  Patient stated since starting carvedilol  she has been having dizziness that seems to be worse in the mornings.    BP readings from 05-29-24: 124/56 sitting 128/60 standing 132/64 standing x 1 minute Pulse 64  Patient did have complaints of dizziness during BP check.  Patient advised that Dr Avram would be made aware of BP readings and further recommendations would be made by him.  Patient agreed to plan and verbalized understanding.  No further questions.

## 2024-05-30 ENCOUNTER — Other Ambulatory Visit: Payer: Self-pay

## 2024-05-30 ENCOUNTER — Other Ambulatory Visit (HOSPITAL_COMMUNITY): Payer: Self-pay

## 2024-05-30 NOTE — Progress Notes (Signed)
 Specialty Pharmacy Refill Coordination Note  Spoke with Janice [name] is a 73 y.o. female contacted today regarding refills of specialty medication(s) Venetoclax  (VENCLEXTA )  Doses on hand: 11  Patient requested: Delivery   Delivery date: 06/05/24   Verified address: [address] [place] [address]  Medication will be filled on 06/04/24.

## 2024-05-30 NOTE — Telephone Encounter (Signed)
 Patient advised that Dr Avram reviewed BP readings from yesterday.  Patient instructed that Dr Avram does not feel dizziness is associated with medication as her BP readings are good.  Brenda instructed to discontinue carvedilol  for several days and to make Dr Avram aware of how she is feeling by sending a MyChart message or calling the office.  Patient agreed to plan and verbalized understanding.

## 2024-06-04 ENCOUNTER — Encounter: Payer: Self-pay | Admitting: Internal Medicine

## 2024-06-08 DIAGNOSIS — G4733 Obstructive sleep apnea (adult) (pediatric): Secondary | ICD-10-CM | POA: Diagnosis not present

## 2024-06-09 ENCOUNTER — Other Ambulatory Visit: Payer: Self-pay | Admitting: Internal Medicine

## 2024-06-10 ENCOUNTER — Other Ambulatory Visit (HOSPITAL_COMMUNITY): Payer: Self-pay

## 2024-06-10 ENCOUNTER — Other Ambulatory Visit: Payer: Self-pay

## 2024-06-10 MED ORDER — PREVNAR 20 0.5 ML IM SUSY
0.5000 mL | PREFILLED_SYRINGE | Freq: Once | INTRAMUSCULAR | 0 refills | Status: AC
  Filled 2024-06-10: qty 0.5, 1d supply, fill #0

## 2024-06-10 MED ORDER — FLUZONE HIGH-DOSE 0.5 ML IM SUSY
0.5000 mL | PREFILLED_SYRINGE | Freq: Once | INTRAMUSCULAR | 0 refills | Status: AC
  Filled 2024-06-10: qty 0.5, 1d supply, fill #0

## 2024-06-11 ENCOUNTER — Other Ambulatory Visit (HOSPITAL_COMMUNITY): Payer: Self-pay

## 2024-06-11 ENCOUNTER — Telehealth: Payer: Self-pay | Admitting: Family Medicine

## 2024-06-11 NOTE — Telephone Encounter (Signed)
 MYC cxl

## 2024-06-15 ENCOUNTER — Other Ambulatory Visit (HOSPITAL_COMMUNITY): Payer: Self-pay

## 2024-06-17 ENCOUNTER — Other Ambulatory Visit: Payer: Self-pay | Admitting: Physician Assistant

## 2024-06-17 ENCOUNTER — Telehealth: Payer: Self-pay | Admitting: Pharmacy Technician

## 2024-06-17 ENCOUNTER — Other Ambulatory Visit (HOSPITAL_COMMUNITY): Payer: Self-pay

## 2024-06-17 ENCOUNTER — Encounter: Payer: Self-pay | Admitting: Internal Medicine

## 2024-06-17 ENCOUNTER — Other Ambulatory Visit: Payer: Self-pay

## 2024-06-17 DIAGNOSIS — K7581 Nonalcoholic steatohepatitis (NASH): Secondary | ICD-10-CM

## 2024-06-17 MED ORDER — JARDIANCE 10 MG PO TABS
10.0000 mg | ORAL_TABLET | Freq: Every day | ORAL | 3 refills | Status: AC
  Filled 2024-06-17: qty 90, 90d supply, fill #0
  Filled 2024-09-11: qty 90, 90d supply, fill #1

## 2024-06-17 MED ORDER — ROSUVASTATIN CALCIUM 10 MG PO TABS
10.0000 mg | ORAL_TABLET | Freq: Every day | ORAL | 3 refills | Status: AC
  Filled 2024-06-17: qty 90, 90d supply, fill #0
  Filled 2024-09-11: qty 90, 90d supply, fill #1

## 2024-06-17 NOTE — Telephone Encounter (Signed)
 Auth Submission: NO AUTH NEEDED Site of care: Site of care: [facility] Payer: [employer] ADVT Medication & CPT/J Code(s) submitted: Venofer  (Iron  Sucrose) J1756 Diagnosis Code:  Route of submission (phone, fax, portal):  Phone # Fax # Auth type: Buy/Bill PB Units/visits requested: X2 DOSES Reference number:  Approval from: 06/17/24 to 09/11/24

## 2024-06-18 DIAGNOSIS — H7292 Unspecified perforation of tympanic membrane, left ear: Secondary | ICD-10-CM | POA: Diagnosis not present

## 2024-06-18 DIAGNOSIS — Z8639 Personal history of other endocrine, nutritional and metabolic disease: Secondary | ICD-10-CM | POA: Diagnosis not present

## 2024-06-18 DIAGNOSIS — H9312 Tinnitus, left ear: Secondary | ICD-10-CM | POA: Diagnosis not present

## 2024-06-20 ENCOUNTER — Other Ambulatory Visit

## 2024-06-20 ENCOUNTER — Ambulatory Visit: Payer: Self-pay | Admitting: Internal Medicine

## 2024-06-20 DIAGNOSIS — I129 Hypertensive chronic kidney disease with stage 1 through stage 4 chronic kidney disease, or unspecified chronic kidney disease: Secondary | ICD-10-CM | POA: Diagnosis not present

## 2024-06-20 DIAGNOSIS — K7581 Nonalcoholic steatohepatitis (NASH): Secondary | ICD-10-CM | POA: Diagnosis not present

## 2024-06-20 DIAGNOSIS — K7469 Other cirrhosis of liver: Secondary | ICD-10-CM

## 2024-06-20 DIAGNOSIS — Z794 Long term (current) use of insulin: Secondary | ICD-10-CM | POA: Diagnosis not present

## 2024-06-20 DIAGNOSIS — N1832 Chronic kidney disease, stage 3b: Secondary | ICD-10-CM | POA: Diagnosis not present

## 2024-06-20 DIAGNOSIS — E782 Mixed hyperlipidemia: Secondary | ICD-10-CM | POA: Diagnosis not present

## 2024-06-20 DIAGNOSIS — E119 Type 2 diabetes mellitus without complications: Secondary | ICD-10-CM | POA: Diagnosis not present

## 2024-06-20 DIAGNOSIS — G4733 Obstructive sleep apnea (adult) (pediatric): Secondary | ICD-10-CM | POA: Diagnosis not present

## 2024-06-20 LAB — CBC WITH DIFFERENTIAL/PLATELET
Basophils Absolute: 0 K/uL (ref 0.0–0.1)
Basophils Relative: 0.5 % (ref 0.0–3.0)
Eosinophils Absolute: 0.1 K/uL (ref 0.0–0.7)
Eosinophils Relative: 2.1 % (ref 0.0–5.0)
HCT: 38.2 % (ref 36.0–46.0)
Hemoglobin: 12.2 g/dL (ref 12.0–15.0)
Lymphocytes Relative: 17.1 % (ref 12.0–46.0)
Lymphs Abs: 0.6 K/uL — ABNORMAL LOW (ref 0.7–4.0)
MCHC: 31.9 g/dL (ref 30.0–36.0)
MCV: 80.4 fl (ref 78.0–100.0)
Monocytes Absolute: 0.4 K/uL (ref 0.1–1.0)
Monocytes Relative: 12 % (ref 3.0–12.0)
Neutro Abs: 2.4 K/uL (ref 1.4–7.7)
Neutrophils Relative %: 68.3 % (ref 43.0–77.0)
Platelets: 51 K/uL — ABNORMAL LOW (ref 150.0–400.0)
RBC: 4.75 Mil/uL (ref 3.87–5.11)
RDW: 17.4 % — ABNORMAL HIGH (ref 11.5–15.5)
WBC: 3.6 K/uL — ABNORMAL LOW (ref 4.0–10.5)

## 2024-06-20 LAB — PROTIME-INR
INR: 1.1 ratio — ABNORMAL HIGH (ref 0.8–1.0)
Prothrombin Time: 12.1 s (ref 9.6–13.1)

## 2024-06-21 ENCOUNTER — Other Ambulatory Visit: Payer: Self-pay

## 2024-06-21 DIAGNOSIS — C8300 Small cell B-cell lymphoma, unspecified site: Secondary | ICD-10-CM

## 2024-06-21 DIAGNOSIS — C9110 Chronic lymphocytic leukemia of B-cell type not having achieved remission: Secondary | ICD-10-CM

## 2024-06-24 ENCOUNTER — Inpatient Hospital Stay: Attending: Hematology

## 2024-06-24 ENCOUNTER — Inpatient Hospital Stay

## 2024-06-24 ENCOUNTER — Inpatient Hospital Stay (HOSPITAL_BASED_OUTPATIENT_CLINIC_OR_DEPARTMENT_OTHER): Admitting: Hematology

## 2024-06-24 VITALS — BP 139/58 | HR 67 | Temp 97.9°F | Resp 18 | Wt 226.2 lb

## 2024-06-24 VITALS — BP 148/60 | HR 65 | Temp 98.0°F | Resp 18

## 2024-06-24 DIAGNOSIS — D801 Nonfamilial hypogammaglobulinemia: Secondary | ICD-10-CM

## 2024-06-24 DIAGNOSIS — Z7951 Long term (current) use of inhaled steroids: Secondary | ICD-10-CM | POA: Insufficient documentation

## 2024-06-24 DIAGNOSIS — Z7982 Long term (current) use of aspirin: Secondary | ICD-10-CM | POA: Diagnosis not present

## 2024-06-24 DIAGNOSIS — R188 Other ascites: Secondary | ICD-10-CM | POA: Insufficient documentation

## 2024-06-24 DIAGNOSIS — Z79899 Other long term (current) drug therapy: Secondary | ICD-10-CM | POA: Insufficient documentation

## 2024-06-24 DIAGNOSIS — C8300 Small cell B-cell lymphoma, unspecified site: Secondary | ICD-10-CM

## 2024-06-24 DIAGNOSIS — D696 Thrombocytopenia, unspecified: Secondary | ICD-10-CM | POA: Insufficient documentation

## 2024-06-24 DIAGNOSIS — K3189 Other diseases of stomach and duodenum: Secondary | ICD-10-CM | POA: Diagnosis not present

## 2024-06-24 DIAGNOSIS — R161 Splenomegaly, not elsewhere classified: Secondary | ICD-10-CM | POA: Diagnosis not present

## 2024-06-24 DIAGNOSIS — C9110 Chronic lymphocytic leukemia of B-cell type not having achieved remission: Secondary | ICD-10-CM | POA: Diagnosis not present

## 2024-06-24 DIAGNOSIS — K7460 Unspecified cirrhosis of liver: Secondary | ICD-10-CM | POA: Diagnosis not present

## 2024-06-24 DIAGNOSIS — R590 Localized enlarged lymph nodes: Secondary | ICD-10-CM | POA: Diagnosis not present

## 2024-06-24 DIAGNOSIS — D509 Iron deficiency anemia, unspecified: Secondary | ICD-10-CM | POA: Insufficient documentation

## 2024-06-24 DIAGNOSIS — K766 Portal hypertension: Secondary | ICD-10-CM | POA: Diagnosis not present

## 2024-06-24 DIAGNOSIS — Z7969 Long term (current) use of other immunomodulators and immunosuppressants: Secondary | ICD-10-CM | POA: Insufficient documentation

## 2024-06-24 LAB — CMP (CANCER CENTER ONLY)
ALT: 27 U/L (ref 0–44)
AST: 22 U/L (ref 15–41)
Albumin: 4.6 g/dL (ref 3.5–5.0)
Alkaline Phosphatase: 92 U/L (ref 38–126)
Anion gap: 8 (ref 5–15)
BUN: 19 mg/dL (ref 8–23)
CO2: 26 mmol/L (ref 22–32)
Calcium: 9.6 mg/dL (ref 8.9–10.3)
Chloride: 107 mmol/L (ref 98–111)
Creatinine: 0.94 mg/dL (ref 0.44–1.00)
GFR, Estimated: >60 mL/min (ref >60)
Glucose, Bld: 174 mg/dL — ABNORMAL HIGH (ref 70–99)
Potassium: 4.3 mmol/L (ref 3.5–5.1)
Sodium: 141 mmol/L (ref 135–145)
Total Bilirubin: 0.6 mg/dL (ref 0.0–1.2)
Total Protein: 6.9 g/dL (ref 6.5–8.1)

## 2024-06-24 LAB — CBC WITH DIFFERENTIAL (CANCER CENTER ONLY)
Abs Immature Granulocytes: 0.01 K/uL (ref 0.00–0.07)
Basophils Absolute: 0 K/uL (ref 0.0–0.1)
Basophils Relative: 1 %
Eosinophils Absolute: 0.1 K/uL (ref 0.0–0.5)
Eosinophils Relative: 2 %
HCT: 40.6 % (ref 36.0–46.0)
Hemoglobin: 12.4 g/dL (ref 12.0–15.0)
Immature Granulocytes: 0 %
Lymphocytes Relative: 13 %
Lymphs Abs: 0.7 K/uL (ref 0.7–4.0)
MCH: 25 pg — ABNORMAL LOW (ref 26.0–34.0)
MCHC: 30.5 g/dL (ref 30.0–36.0)
MCV: 81.9 fL (ref 80.0–100.0)
Monocytes Absolute: 0.5 K/uL (ref 0.1–1.0)
Monocytes Relative: 10 %
Neutro Abs: 3.9 K/uL (ref 1.7–7.7)
Neutrophils Relative %: 74 %
Platelet Count: 55 K/uL — ABNORMAL LOW (ref 150–400)
RBC: 4.96 MIL/uL (ref 3.87–5.11)
RDW: 15.9 % — ABNORMAL HIGH (ref 11.5–15.5)
WBC Count: 5.2 K/uL (ref 4.0–10.5)
nRBC: 0 % (ref 0.0–0.2)

## 2024-06-24 LAB — IRON AND IRON BINDING CAPACITY (CC-WL,HP ONLY)
Iron: 56 ug/dL (ref 28–170)
Saturation Ratios: 12 % (ref 10.4–31.8)
TIBC: 486 ug/dL — ABNORMAL HIGH (ref 250–450)
UIBC: 430 ug/dL (ref 148–442)

## 2024-06-24 LAB — LACTATE DEHYDROGENASE: LDH: 163 U/L (ref 98–192)

## 2024-06-24 LAB — URIC ACID: Uric Acid, Serum: 7.4 mg/dL — ABNORMAL HIGH (ref 2.5–7.1)

## 2024-06-24 LAB — FERRITIN: Ferritin: 26 ng/mL (ref 11–307)

## 2024-06-24 MED ORDER — DIPHENHYDRAMINE HCL 25 MG PO CAPS
25.0000 mg | ORAL_CAPSULE | Freq: Once | ORAL | Status: AC
  Administered 2024-06-24: 25 mg via ORAL
  Filled 2024-06-24: qty 1

## 2024-06-24 MED ORDER — IMMUNE GLOBULIN (HUMAN) 10 GM/100ML IV SOLN
400.0000 mg/kg | INTRAVENOUS | Status: DC
  Administered 2024-06-24: 40 g via INTRAVENOUS
  Filled 2024-06-24: qty 400

## 2024-06-24 MED ORDER — ONDANSETRON HCL 4 MG/2ML IJ SOLN
4.0000 mg | Freq: Once | INTRAMUSCULAR | Status: AC
  Administered 2024-06-24: 4 mg via INTRAVENOUS
  Filled 2024-06-24: qty 2

## 2024-06-24 MED ORDER — ACETAMINOPHEN 325 MG PO TABS
650.0000 mg | ORAL_TABLET | Freq: Once | ORAL | Status: AC
  Administered 2024-06-24: 650 mg via ORAL
  Filled 2024-06-24: qty 2

## 2024-06-24 MED ORDER — METHYLPREDNISOLONE SODIUM SUCC 40 MG IJ SOLR
40.0000 mg | Freq: Once | INTRAMUSCULAR | Status: AC
  Administered 2024-06-24: 40 mg via INTRAVENOUS
  Filled 2024-06-24: qty 1

## 2024-06-24 NOTE — Patient Instructions (Signed)
 Immune Globulin  Injection What is this medication? IMMUNE GLOBULIN  (im MUNE GLOB yoo lin) treats many immune system conditions. It works by Designer, multimedia extra antibodies. Antibodies are proteins made by the immune system that help protect the body. This medicine may be used for other purposes; ask your health care provider or pharmacist if you have questions. COMMON BRAND NAME(S): ASCENIV, Baygam, BIVIGAM, Carimune, Carimune NF, cutaquig, Cuvitru, Flebogamma, Flebogamma DIF, GamaSTAN, GamaSTAN S/D, Gamimune N, Gammagard, Gammagard S/D, Gammaked, Gammaplex, Gammar-P IV, Gamunex, Gamunex-C, Hizentra, Iveegam, Iveegam EN, Octagam, Panglobulin, Panglobulin NF, panzyga, Polygam S/D, Privigen , Sandoglobulin, Venoglobulin-S, Vigam, Vivaglobulin, Xembify What should I tell my care team before I take this medication? They need to know if you have any of these conditions: Blood clotting disorder Condition where you have excess fluid in your body, such as heart failure or edema Dehydration Diabetes Have had blood clots Heart disease Immune system conditions Kidney disease Low levels of IgA Recent or upcoming vaccine An unusual or allergic reaction to immune globulin , other medications, foods, dyes, or preservatives Pregnant or trying to get pregnant Breastfeeding How should I use this medication? This medication is infused into a vein or under the skin. It may also be injected into a muscle. It is usually given by your care team in a hospital or clinic setting. It may also be given at home. If you get this medication at home, you will be taught how to prepare and give it. Take it as directed on the prescription label. Keep taking it unless your care team tells you to stop. It is important that you put your used needles and syringes in a special sharps container. Do not put them in a trash can. If you do not have a sharps container, call your pharmacist or care team to get one. Talk to your care team  about the use of this medication in children. While it may be given to children for selected conditions, precautions do apply. Overdosage: If you think you have taken too much of this medicine contact a poison control center or emergency room at once. NOTE: This medicine is only for you. Do not share this medicine with others. What if I miss a dose? If you get this medication at the hospital or clinic: It is important not to miss your dose. Call your care team if you are unable to keep an appointment. If you give yourself this medication at home: If you miss a dose, take it as soon as you can. Then continue your normal schedule. If it is almost time for your next dose, take only that dose. Do not take double or extra doses. Call your care team with questions. What may interact with this medication? Live virus vaccines This list may not describe all possible interactions. Give your health care provider a list of all the medicines, herbs, non-prescription drugs, or dietary supplements you use. Also tell them if you smoke, drink alcohol , or use illegal drugs. Some items may interact with your medicine. What should I watch for while using this medication? Your condition will be monitored carefully while you are receiving this medication. Tell your care team if your symptoms do not start to get better or if they get worse. You may need blood work done while you are taking this medication. This medication increases the risk of blood clots. People with heart, blood vessel, or blood clotting conditions are more likely to develop a blood clot. Other risk factors include advanced age, estrogen use, tobacco  use, lack of movement, and being overweight. This medication can decrease the response to a vaccine. If you need to get vaccinated, tell your care team if you have received this medication within the last year. Extra booster doses may be needed. Talk to your care team to see if a different vaccination schedule  is needed. This medication is made from donated human blood. There is a small risk it may contain bacteria or viruses, such as hepatitis or HIV. All products are processed to kill most bacteria and viruses. Talk to your care team if you have questions about the risk of infection. If you have diabetes, talk to your care team about which device you should use to check your blood sugar. This medication may cause some devices to report falsely high blood sugar levels. This may cause you to react by not treating a low blood sugar level or by giving an insulin  dose that was not needed. This can cause severe low blood sugar levels. What side effects may I notice from receiving this medication? Side effects that you should report to your care team as soon as possible: Allergic reactions--skin rash, itching, hives, swelling of the face, lips, tongue, or throat Blood clot--pain, swelling, or warmth in the leg, shortness of breath, chest pain Fever, neck pain or stiffness, sensitivity to light, headache, nausea, vomiting, confusion, which may be signs of meningitis Hemolytic anemia--unusual weakness or fatigue, dizziness, headache, trouble breathing, dark urine, yellowing skin or eyes Kidney injury--decrease in the amount of urine, swelling of the ankles, hands, or feet Low sodium level--muscle weakness, fatigue, dizziness, headache, confusion Shortness of breath or trouble breathing, cough, unusual weakness or fatigue, blue skin or lips Side effects that usually do not require medical attention (report these to your care team if they continue or are bothersome): Chills Diarrhea Fever Headache Nausea This list may not describe all possible side effects. Call your doctor for medical advice about side effects. You may report side effects to FDA at 1-800-FDA-1088. Where should I keep my medication? Keep out of the reach of children and pets. You will be instructed on how to store this medication. Get rid of  any unused medication after the expiration date. To get rid of medications that are no longer needed or have expired: Take the medication to a medication take-back program. Check with your pharmacy or law enforcement to find a location. If you cannot return the medication, ask your pharmacist or care team how to get rid of this medication safely. NOTE: This sheet is a summary. It may not cover all possible information. If you have questions about this medicine, talk to your doctor, pharmacist, or health care provider.  2025 Elsevier/Gold Standard (2023-11-13 00:00:00)

## 2024-06-24 NOTE — Progress Notes (Signed)
 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  INTERVAL HISTORY:  Janice [name] is a 73 y.o. female who is here for continued evaluation and management of her CLL with hypogammaglobinemia.  Last seen by me on 04/29/2024, and noted that her chronic diarrhea somewhat improved since she stopped Metformin , though still experiences some minimal intermittent grade 1 diarrhea, and also noted some intermittent fluid retention for which she is on diuretics for her liver cirrhosis. Endorses having some abdominal discomfort intermittently, so was scheduled for an endoscopy with Dr. Avram on 05/16/2024 to evaluate her epigastric discomfort as well as for variceal screening.  Today she says she has noticed a lymph node on the left side of her neck, around her ear causing her some discomfort, as well as occipital tenderness. Reports she's been suffering from an ear infection since July, which she has been told is fungal in origin but when she saw another ENT specialist, was told that there is no infection present - diagnosed instead with Left TM Perforation. Endorses hearing a roaring sound and still experiencing itching.  Regarding her Upper Endoscopy 05/16/2024 with Dr. Avram, noted trace grade I esophageal varices,   - Nodular vs polypoid mucosa ( friable, inflamed) in the gastric body and in the gastric antrum. Biopsied the body changes as these were new. Antrum looks similar to before and biopsies have been non- diagnostic. Suspect this is part of portal gastropathy.  - Portal  hypertensive gastropathy. Moderate, slightly friable.   - The examination was otherwise normal.  Eventually would like to repeat CT Neck, Abdomen, before her next office visit Started Venetoclax   - unable to tolerate higher doses Tolerating 100 mg currently with some nausea and diarrhea Diarrhea improved since stopping Metformin   Stopped taking Align probiotic  IVIG monthly no major cough/cold IV iron  - Treating blood loss possibly related to liver cirrhosis   No NSAIDS  Updated Flu vaccination, PNA, RSV Vaccine Counseling: consider Covid-19 vaccination if she has not updated it or tested positive for Covid-19 in the last 6 months.  No scalp infections/boils  Tenderness on palpation - minor lymph node left-side of neck  No new axillary lumps/bumps, new back pain  Pain in arms  MEDICAL HISTORY:  Past Medical History:  Diagnosis Date   Allergy    Asthma    seasonal   Cellulitis 2013   Left toe   Chronic kidney disease    stage 1   Cirrhosis (HCC)    Common migraine    History of   Complication of anesthesia    Per pt/had breathing problems with block during rotator cuff surgery. 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   Coronary artery disease    DDD (degenerative disc disease)    Depression    denies takes paxil for migraines   Diabetes mellitus    type 2   Diabetic peripheral neuropathy (HCC)    Difficult airway for intubation 10/20/2022   DLx 1 with MAC 4, unable to see cords, DL with lopro S3 glidescope, easy atraumatic intubation   Difficult intubation 03/03/2023   Diverticulitis    Diverticulosis    DJD (degenerative  joint disease)    Esophageal varices (HCC)    GERD (gastroesophageal reflux disease)    History of colon polyps    hyperplastic   History of gastric polyp    Hyperlipidemia    Hypertension    Iron  deficiency anemia    Obesity    OSA on CPAP    cpap   Peripheral neuropathy    Pneumonia    april 2020  mild   Primary localized  osteoarthritis of left knee 03/27/2019   Sensorineural hearing loss    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HCC)     SURGICAL HISTORY: Past Surgical History:  Procedure Laterality Date   ANKLE SURGERY     Left    BREAST BIOPSY Left 2021   left axilla, lymphoma   COLONOSCOPY  05/2018   ESOPHAGOGASTRODUODENOSCOPY N/A 05/16/2024   Procedure: EGD (ESOPHAGOGASTRODUODENOSCOPY);  Surgeon: Avram Lupita BRAVO, MD;  Location: [facility];  Service: Gastroenterology;  Laterality: N/A;   ESOPHAGOGASTRODUODENOSCOPY (EGD) WITH PROPOFOL  N/A 03/09/2023   Procedure: ESOPHAGOGASTRODUODENOSCOPY (EGD) WITH PROPOFOL ;  Surgeon: Avram Lupita BRAVO, MD;  Location: [facility];  Service: Gastroenterology;  Laterality: N/A;   LEEP N/A 09/14/2018   Procedure: LOOP ELECTROSURGICAL EXCISION PROCEDURE (LEEP);  Surgeon: Leva Rush, MD;  Location: [facility];  Service: Gynecology;  Laterality: N/A;   MASS EXCISION Right 06/24/2021   Procedure: EXCISION RIGHT POSTERIOR NECK MASS;  Surgeon: Kinsinger, Herlene Righter, MD;  Location: [facility];  Service: General;  Laterality: Right;   ROTATOR CUFF REPAIR Bilateral 2012, 2015   TONSILLECTOMY     TOTAL KNEE ARTHROPLASTY Left 04/08/2019   Procedure: TOTAL KNEE ARTHROPLASTY;  Surgeon: Jane Charleston, MD;  Location: [facility];  Service: Orthopedics;  Laterality: Left;   UMBILICAL HERNIA REPAIR N/A 10/20/2022   Procedure: OPEN HERNIA REPAIR UMBILICAL ADULT with Mesh;  Surgeon: Kinsinger, Herlene Righter, MD;  Location: [facility];  Service: General;  Laterality: N/A;   UPPER GI ENDOSCOPY  05/2018    SOCIAL HISTORY: Social History   Socioeconomic History   Marital status: Widowed    Spouse name: Not on file   Number of children: 0   Years of education: 3 years college   Highest education level: Not on file  Occupational History   Occupation: Unemployed  Tobacco Use   Smoking status: Never    Passive exposure: Past   Smokeless tobacco: Never  Vaping Use   Vaping status: Never  Used  Substance and Sexual Activity   Alcohol  use: Not Currently    Comment: very rare   Drug use: No   Sexual activity: Not Currently    Birth control/protection: None, Post-menopausal  Other Topics Concern   Not on file  Social History Narrative   1 caffeine drink daily    Right-handed   widow (husband died from [place] cirrhosis)   Social Drivers of Health   Financial Resource Strain: Medium Risk (08/20/2020)   Overall Financial Resource Strain (CARDIA)    Difficulty of Paying Living Expenses: Somewhat hard  Food Insecurity: No Food Insecurity (10/20/2022)   Hunger Vital Sign    Worried About Running Out of Food in the Last Year: Never true    Ran Out of Food in the Last Year: Never true  Transportation Needs: No Transportation Needs (10/20/2022)   PRAPARE - Administrator, Civil Service (Medical): No    Lack of Transportation (Non-Medical): No  Physical Activity: Not on file  Stress: Not on file  Social Connections: Unknown (01/25/2022)   Received  from [facility]   Social Network    Social Network: Not on file  Intimate Partner Violence: Not At Risk (10/20/2022)   Humiliation, Afraid, Rape, and Kick questionnaire    Fear of Current or Ex-Partner: No    Emotionally Abused: No    Physically Abused: No    Sexually Abused: No    FAMILY HISTORY: Family History  Problem Relation Age of Onset   Breast cancer Mother    Bone cancer Mother    Heart failure Father    Diabetes Mellitus II Father    Hypertension Father    Diabetes Mellitus II Sister    Obesity Sister    Other Sister        retina problem   Hypertension Brother    Diabetes Mellitus II Brother    Colon cancer Neg Hx    Rectal cancer Neg Hx    Stomach cancer Neg Hx    Esophageal cancer Neg Hx    Pancreatic cancer Neg Hx    Liver disease Neg Hx     ALLERGIES:  is allergic to breo ellipta [fluticasone  furoate-vilanterol], gazyva  [obinutuzumab ], iodine , latex, tessalon  [benzonatate ], zocor  [simvastatin], levaquin [levofloxacin], penicillins, povidone-iodine , wellbutrin [bupropion], amoxicillin, chlorhexidine , diflucan [fluconazole], thalitone  [chlorthalidone ], amoxicillin-pot clavulanate, and vibra-tab [doxycycline].  MEDICATIONS:  Current Outpatient Medications  Medication Sig Dispense Refill   acetic acid  2 % otic solution Place 4 drops into the left ear 2 (two) times daily. 15 mL 1   albuterol  (VENTOLIN  HFA) 108 (90 Base) MCG/ACT inhaler Inhale 1-2 puffs into the lungs every 6 (six) hours as needed (Cough). 18 g 0   albuterol  (VENTOLIN  HFA) 108 (90 Base) MCG/ACT inhaler Inhale 1-2 puffs into the lungs every 6 (six) hours as needed and as directed for shortness of breath. 6.7 g 4   b complex vitamins capsule Take 1 capsule by mouth in the morning.     bacitracin  500 UNIT/GM ointment Apply topically 2 (two) times daily for 10 days 30 g 0   budesonide -formoterol  (SYMBICORT ) 80-4.5 MCG/ACT inhaler Inhale 2 puffs into the lungs daily. 10.2 g 12   Carboxymethylcellul-Glycerin (REFRESH OPTIVE PF OP) Place 1 drop into both eyes 3 (three) times daily as needed (dry/irritated eyes.). Non-pres     cephALEXin  (KEFLEX ) 500 MG capsule Take 2 capsules (1,000 mg total) by mouth as directed 1 hour prior to dental work. 20 capsule 0   Coenzyme Q10 (CO Q-10) 100 MG CAPS Take 100 mg by mouth in the morning.     Continuous Glucose Sensor (FREESTYLE LIBRE 2 SENSOR) MISC Replace every 14 days and use to check blood sugar continuously. 2 each 3   Continuous Glucose Sensor (FREESTYLE LIBRE 2 SENSOR) MISC Replace every 14 days and use to check blood sugar continuously. 6 each 3   desoximetasone  (TOPICORT ) 0.25 % cream Apply a small amount to skin twice a day as needed for flares 60 g 0   diclofenac  Sodium (VOLTAREN  ARTHRITIS PAIN) 1 % GEL Apply topically 4 (four) times daily to bilateral knees and ankles 200 g 5   diclofenac  sodium (VOLTAREN ) 1 % GEL Apply 2 g topically 2 (two) times daily as needed (knee  pain/ foot /Triger finger).  2   empagliflozin  (JARDIANCE ) 10 MG TABS tablet Take 1 tablet (10 mg total) by mouth daily. 90 tablet 3   fluticasone  (FLONASE ) 50 MCG/ACT nasal spray Place 2 sprays into both nostrils in the morning. 48 g 3   furosemide  (LASIX ) 20 MG tablet Take 1 tablet (  20 mg total) by mouth as needed. Take if you gain 1 lb in 24 hours, or 3 lbs in 1 week. Take it daily and stop when weight back to baseline. 30 tablet 6   gabapentin  (NEURONTIN ) 300 MG capsule Take 2 capsules (600 mg total) by mouth at bedtime. 180 capsule 3   glucose blood (ONETOUCH VERIO) test strip Use as directed to check blood sugar 3 times a day. 300 strip 3   glucose blood test strip Use to test blood sugar 3 times daily as directed 300 each 3   hydrocortisone  (ANUSOL -HC) 25 MG suppository Place 1 suppository (25 mg total) rectally daily. 30 suppository 0   insulin  aspart (NOVOLOG  FLEXPEN) 100 UNIT/ML FlexPen Inject 30 Units into the skin daily with breakfast AND 30 Units daily with lunch AND 30 Units daily with supper. 45 mL 3   insulin  aspart (NOVOLOG  FLEXPEN) 100 UNIT/ML FlexPen Inject 30 Units into the skin daily with breakfast AND 30 Units daily with lunch AND 30 Units daily with supper. 90 mL 3   insulin  aspart (NOVOLOG  FLEXPEN) 100 UNIT/ML FlexPen Inject 70 units under the skin with meals 3 times a day (add ISF 10, target 200 - up to 300 units/day) 65 mL 3   insulin  degludec (TRESIBA  FLEXTOUCH) 200 UNIT/ML FlexTouch Pen Inject 80 Units into the skin daily. 30 mL 5   insulin  degludec (TRESIBA  FLEXTOUCH) 200 UNIT/ML FlexTouch Pen Inject 92 Units into the skin once daily. 42 mL 3   Insulin  Pen Needle 32G X 4 MM MISC Use as directed 5 (five) times daily. 400 each 3   irbesartan  (AVAPRO ) 300 MG tablet Take 1 tablet (300 mg total) by mouth every evening.     irbesartan  (AVAPRO ) 300 MG tablet Take 1 tablet (300 mg total) by mouth in the morning. 90 tablet 3   irbesartan  (AVAPRO ) 300 MG tablet Take 1 tablet (300  mg total) by mouth every evening. 90 tablet 3   Lancets (ONETOUCH DELICA PLUS LANCET33G) MISC Use to test blood sugar 3 times daily 300 each 2   LORazepam  (ATIVAN ) 0.5 MG tablet Take 1 or 2 before MRI to reduce claustrophobia 2 tablet 0   omeprazole  (PRILOSEC) 40 MG capsule Take 1 capsule (40 mg total) by mouth 2 (two) times daily. 180 capsule 3   ondansetron  (ZOFRAN -ODT) 4 MG disintegrating tablet Dissolve 1 tablet (4 mg) by mouth every 8 hours as needed for nausea or vomiting. 20 tablet 0   prednisoLONE  acetate (PRED FORTE ) 1 % ophthalmic suspension Place 1 drop into both eyes 4 (four) times daily. 10 mL 0   prednisoLONE  acetate (PREDNISOLONE  ACETATE P-F) 1 % ophthalmic suspension Place 1 drop into both eyes 4 (four) times daily. 5 mL 0   rosuvastatin  (CRESTOR ) 10 MG tablet Take 1 tablet (10 mg total) by mouth daily. 90 tablet 3   sertraline  (ZOLOFT ) 50 MG tablet Take 1 tablet (50 mg total) by mouth daily. 90 tablet 3   spironolactone  (ALDACTONE ) 25 MG tablet Take 25 mg by mouth daily.     terconazole (TERAZOL 3) 0.8 % vaginal cream Place 1 applicator vaginally daily as needed (yeast infections).     venetoclax  (VENCLEXTA ) 100 MG tablet Take 1 tablet (100 mg total) by mouth daily. Tablets should be swallowed whole with a meal and a full glass of water . 30 tablet 3   Zoster Vaccine Adjuvanted (SHINGRIX ) injection Inject into the muscle. 1 each 1   No current facility-administered medications for this visit.  REVIEW OF SYSTEMS:   10 Point review of Systems was done is negative except as noted above.  PHYSICAL EXAMINATION:  BP (!) 139/58   Pulse 67   Temp 97.9 F (36.6 C)   Resp 18   Wt 226 lb 3.2 oz (102.6 kg)   SpO2 96%   BMI 38.23 kg/m   GENERAL:alert, in no acute distress and comfortable SKIN: no acute rashes, no significant lesions EYES: conjunctiva are pink and non-injected, sclera anicteric OROPHARYNX: MMM, no exudates, no oropharyngeal erythema or ulceration NECK: supple, no  JVD LYMPH:  no palpable lymphadenopathy in the cervical, axillary or inguinal regions LUNGS: clear to auscultation b/l with normal respiratory effort HEART: regular rate & rhythm ABDOMEN:  normoactive bowel sounds , non tender, (+) mildly distended, palpable splenomegaly Extremity: no pedal edema PSYCH: alert & oriented x 3 with fluent speech NEURO: no focal motor/sensory deficits   LABORATORY DATA:  I have reviewed the data as listed  .    Latest Ref Rng & Units 06/24/2024   11:58 AM 06/20/2024    2:33 PM 05/27/2024    8:15 AM  CBC  WBC 4.0 - 10.5 K/uL 5.2  3.6  4.1   Hemoglobin 12.0 - 15.0 g/dL 87.5  87.7  88.3   Hematocrit 36.0 - 46.0 % 40.6  38.2  37.3   Platelets 150 - 400 K/uL 55  51.0  56    . CBC    Component Value Date/Time   WBC 5.2 06/24/2024 1158   WBC 3.6 (L) 06/20/2024 1433   RBC 4.96 06/24/2024 1158   HGB 12.4 06/24/2024 1158   HGB 10.3 (L) 10/18/2021 1222   HCT 40.6 06/24/2024 1158   HCT 33.0 (L) 10/18/2021 1222   PLT 55 (L) 06/24/2024 1158   PLT 100 (LL) 10/18/2021 1222   MCV 81.9 06/24/2024 1158   MCV 84 10/18/2021 1222   MCH 25.0 (L) 06/24/2024 1158   MCHC 30.5 06/24/2024 1158   RDW 15.9 (H) 06/24/2024 1158   RDW 15.1 10/18/2021 1222   LYMPHSABS 0.7 06/24/2024 1158   MONOABS 0.5 06/24/2024 1158   EOSABS 0.1 06/24/2024 1158   BASOSABS 0.0 06/24/2024 1158     .    Latest Ref Rng & Units 06/24/2024   11:58 AM 05/27/2024    8:15 AM 04/29/2024   11:23 AM  CMP  Glucose 70 - 99 mg/dL 825  63  845   BUN 8 - 23 mg/dL 19  25  23    Creatinine 0.44 - 1.00 mg/dL 9.05  9.05  8.92   Sodium 135 - 145 mmol/L 141  142  134   Potassium 3.5 - 5.1 mmol/L 4.3  4.2  5.4   Chloride 98 - 111 mmol/L 107  109  104   CO2 22 - 32 mmol/L 26  27  24    Calcium  8.9 - 10.3 mg/dL 9.6  9.0  8.9   Total Protein 6.5 - 8.1 g/dL 6.9  6.8  6.5   Total Bilirubin 0.0 - 1.2 mg/dL 0.6  0.5  0.6   Alkaline Phos 38 - 126 U/L 92  85  85   AST 15 - 41 U/L 22  19  20    ALT 0 - 44  U/L 27  23  25     LDH Lab Results  Component Value Date   LDH 155 05/27/2024   Iron  Panel Lab Results  Component Value Date   IRON  39 05/27/2024   UIBC 479 (H) 05/27/2024  TIBC 518 (H) 05/27/2024   IRONPCTSAT 8 (L) 05/27/2024   FERRITIN 17 05/27/2024   Uric Acid Lab Results  Component Value Date   LABURIC 7.4 (H) 06/24/2024   04/08/2020 FISH/CLL Panel:   03/30/2020 Left Axilla Flow Pathology Report [id number]):   03/30/2020 Left axilla lymph node Bx ([id number]):   Surgical Pathology 05/16/2024 Surgical Pathology Report   Clinical History: Esophageal varices, cirrhosis, R/O neoplasia (las)   FINAL MICROSCOPIC DIAGNOSIS:  A. GASTRIC POLYP, BODY; POLYPECTOMY: - Polypoid fragments of gastric mucosa with edema, foveolar hyperplasia, and bands of smooth muscle in the lamina propria. see comment. - Negative for H. pylori, intestinal metaplasia, dysplasia, and malignancy.  Comment: The differential diagnosis for the stomach polyp includes hamartomatous polyp versus inflammatory / hyperplastic polyp with superimposed prolapse-type change. Background / non-polyp mucosa is not present for evaluation. Correlation with clinical impression and endoscopic findings is required. If clinically appropriate syndromes involving hamartomatous GI polyps (e.g. Peutz-Jeghers syndrome, juvenile polyposis syndrome, Cowden syndrome) should be reasonably excluded.  GROSS DESCRIPTION:  Received in formalin are tan, soft tissue fragments that are submitted in toto. Number: Multiple size: Range from 0.2 to 0.5 cm blocks: 1 SHIRLEEN 05/16/2024)    Final Diagnosis performed by Rexene Daily, MD.   Electronically signed 05/20/2024 Technical component performed at [facility], [address] [place], [place] [address].  Professional component performed at [facility]. [address] [place], [place] [address]  Immunohistochemistry Technical  component (if applicable) was performed at [facility]. [address], [place], [place] [address].  IMMUNOHISTOCHEMISTRY DISCLAIMER (if applicable): Some of these immunohistochemical stains may have been developed and the performance characteristics determine by [facility]. Some may not have been cleared or approved by the U.S. Food and Drug Administration. The FDA has determined that such clearance or approval is not necessary. This test is used for clinical purposes. It should not be regarded as investigational or for research. This laboratory is certified under the Clinical Laboratory Improvement Amendments of 1988 (CLIA-88) as qualified to perform high complexity clinical laboratory testing.  The controls stained appropriately.     RADIOGRAPHIC STUDIES: I have personally reviewed the radiological images as listed and agreed with the findings in the report. MR LIVER W WO CONTRAST Result Date: 03/31/2024 EXAM: MRCP WITH AND WITHOUT IV CONTRAST 03/30/2024 09:25:15 AM TECHNIQUE: Multisequence, multiplanar magnetic resonance images of the abdomen with and without intravenous contrast. MRCP sequences were performed. COMPARISON: Abdominal ultrasound dated 03/25/2024 and CT abdomen/pelvis dated 08/22/2023. CLINICAL HISTORY: Abnormal gallbladder on ultrasound possible polyps, Cirrhosis. FINDINGS: LIVER: Cirrhosis. Heterogeneous arterial enhancement of the liver which normalizes on delayed phases. However, there is a 16 mm lesion in the anterior right hepatic dome (series 15 image 16) which favored a benign 10 mm cyst in 2022 but is technically indeterminate on the current study. However, given relative stability, a benign etiology is favored. GALLBLADDER AND BILIARY SYSTEM: Gallbladder is within normal limits on MR. No intrahepatic or extrahepatic ductal dilation. SPLEEN: Splenomegaly. PANCREAS/PANCREATIC DUCT: Visualized pancreas is unremarkable. No pancreatic  ductal dilatation. ADRENAL GLANDS: Unremarkable. KIDNEYS: Unremarkable. LYMPH NODES: Para-aortic lymphadenopathy measuring up to 18 mm short axis (series 11 image 76), mildly improved. VASULATURE: Unremarkable. PERITONEUM: No ascites. ABDOMINAL WALL: No hernia. No mass. BOWEL: Grossly unremarkable. No bowel obstruction. BONES: No acute abnormality or worrisome osseous lesion. SOFT TISSUES: Unremarkable. MISCELLANEOUS: Unremarkable. IMPRESSION: 1. Gallbladder is within normal limits on MR. 2. Cirrhosis. No findings suspicious for HCC. 3. Small  para-aortic lymphadenopathy, related to the patient's known CLL, mildly improved. 4. Splenomegaly. Electronically signed by: Pinkie Pebbles MD 03/31/2024 10:33 PM EDT RP Workstation: [id number]   MRI Abdomen With And Without Contrast 07/08/2021 showed IMPRESSION: 1. Moderate motion and patient body habitus degraded exam. 2. Extensive abdominal adenopathy, similar to slightly increased compared to 05/01/2020 PET. 3. Cirrhosis and hepatosplenomegaly with suspicion of periesophageal varices. 4. Nonspecific hypoenhancing or nonenhancing splenic lesions. These could simply represent Ora Orange bodies in the setting of cirrhosis. Lymphomatous involvement cannot be excluded. 5. Heterogeneous enhancement throughout the liver, without well-defined suspicious mass. Possibly due to altered perfusion in the setting of cirrhosis and porta hepatis adenopathy. Lymphomatous involvement cannot be excluded. Consider pre and post-contrast MRI follow-up at 2-3 months. A more aggressive approach would include random liver biopsy.  MRI abdomen with and without contrast 02/04/2022 showed IMPRESSION: 1. Examination is generally limited by breath motion artifact, particularly multiphasic contrast enhanced sequences. Within this limitation, cirrhotic morphology of the liver without focal liver lesion. 2. 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 3. Trace ascites. 4. Numerous enlarged gastrohepatic ligament, portacaval, retroperitoneal, and small bowel mesenteric lymph nodes are unchanged, in keeping with history of lymphoma.  ASSESSMENT & PLAN:   73 yo female with   1) CLL/SLL -02/28/2020 MM Breast ([id number]) revealed Bilateral axillary adenopathy. -03/30/2020 Left axilla lymph node biopsy ([id number]) revealed CHRONIC LYMPHOCYTIC LYMPHOMA/SMALL LYMPHOCYTIC LYMPHOMA.  -03/30/2020 Left Axilla Flow Pathology Report [id number] [id number]) revealed Monoclonal B-cell population with coexpression of CD5 comprises 80% of all lymphocytes. -04/20/20 PET/CT revealed 1. Adenopathy within the neck, chest, abdomen, and pelvis, consistent with active lymphoma. Much of this is not significantly hypermetabolic. Some upper abdominal nodes are moderately hypermetabolic.  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 NO overt GI bleeding noted.    PLAN:  - Discussed available lab results from 06/24/3034 Cbc shows normal hgb of 12.4, wbc count of 5.2k and plt of 55k Cmp stable Ferritin 26 with iron  saturation of 12% LDH WNL at 163 She has been setup for IV Iron  replacement. -no significant toxicities from current dose of venetoclax  @ 100mg  po daily - No significant infection issues.  We shall continue her IVIG every 4 weeks for hypogammaglobinemia. - She is recommended to stay up-to-date with her age-appropriate vaccinations including her flu shot and COVID-19  vaccinations.  FOLLOW-UP: Continue IVIG every 4 weeks IV Venofer  currently milligrams weekly x 3 doses RTC with Dr Onesimo with labs in 2 months  .The total time spent in the appointment was 30 minutes* .  All of the patient's questions were answered with apparent satisfaction. The patient knows to call the clinic with any problems, questions or concerns.   Emaline Onesimo MD MS AAHIVMS [facility] Hematology/Oncology Physician [facility]  .*Total Encounter Time as defined by the Centers for Medicare and Medicaid Services includes, in addition to the face-to-face time of a patient visit (documented in  the note above) non-face-to-face time: obtaining and reviewing outside history, ordering and reviewing medications, tests or procedures, care coordination (communications with other health care professionals or caregivers) and documentation in the medical record.   I, Damien Blanks, acting as a Neurosurgeon for Emaline Saran, MD.,have documented all relevant documentation on the behalf of Emaline Saran, MD,as directed by  Emaline Saran, MD while in the presence of Emaline Saran, MD.  .I have reviewed the above documentation for accuracy and completeness, and I agree with the above. .Mesa Janus Kishore Patsie Mccardle MD

## 2024-06-25 ENCOUNTER — Other Ambulatory Visit (HOSPITAL_COMMUNITY): Payer: Self-pay

## 2024-06-25 ENCOUNTER — Encounter (INDEPENDENT_AMBULATORY_CARE_PROVIDER_SITE_OTHER): Payer: Self-pay

## 2024-06-25 ENCOUNTER — Ambulatory Visit (HOSPITAL_COMMUNITY)
Admission: RE | Admit: 2024-06-25 | Discharge: 2024-06-25 | Disposition: A | Discharge status: Home / Self Care | Source: Ambulatory Visit | Attending: Cardiology | Admitting: Cardiology

## 2024-06-25 ENCOUNTER — Encounter: Payer: Self-pay | Admitting: Hematology

## 2024-06-25 ENCOUNTER — Other Ambulatory Visit: Payer: Self-pay

## 2024-06-25 DIAGNOSIS — I6523 Occlusion and stenosis of bilateral carotid arteries: Secondary | ICD-10-CM | POA: Diagnosis not present

## 2024-06-25 NOTE — Progress Notes (Signed)
 Specialty Pharmacy Refill Coordination Note  Janice [name] is a 73 y.o. female contacted today regarding refills of specialty medication(s) Venetoclax  (VENCLEXTA )   Patient requested Delivery   Delivery date: 06/27/24   Verified address: [name] [address]   Medication will be filled on 06/26/24.

## 2024-06-25 NOTE — Progress Notes (Signed)
 Clinical Intervention Note  Clinical Intervention Notes: Patient reported starting Align probiotic supplement. No DDIs identified with Venclexta    Clinical Intervention Outcomes: Prevention of an adverse drug event   Advertising account planner

## 2024-06-26 ENCOUNTER — Other Ambulatory Visit: Payer: Self-pay

## 2024-06-27 ENCOUNTER — Ambulatory Visit: Admitting: *Deleted

## 2024-06-27 NOTE — Progress Notes (Signed)
 Patient made a decision not to  have a 3rd IV start after 2 unsuccessful IV sticks, Iron  Infusion cancelled for today and patient rescheduled.   Lamarr Pouch, RN

## 2024-07-01 ENCOUNTER — Encounter: Payer: Self-pay | Admitting: Physician Assistant

## 2024-07-01 ENCOUNTER — Encounter: Payer: Self-pay | Admitting: Hematology

## 2024-07-01 NOTE — Progress Notes (Incomplete)
 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  INTERVAL HISTORY:  Janice [name] is a 73 y.o. female who is here for continued evaluation and management of her CLL with hypogammaglobinemia.  Last seen by me on 04/29/2024, and noted that her chronic diarrhea somewhat improved since she stopped Metformin , though still experiences some minimal intermittent grade 1 diarrhea, and also noted some intermittent fluid retention for which she is on diuretics for her liver cirrhosis. Endorses having some abdominal discomfort intermittently, so was scheduled for an endoscopy with Dr. Avram on 05/16/2024 to evaluate her epigastric discomfort as well as for variceal screening.  Today she says she has noticed a lymph node on the left side of her neck, around her ear causing her some discomfort, as well as occipital tenderness. Reports she's been suffering from an ear infection since July, which she has been told is fungal in origin but when she saw another ENT specialist, was told that there is no infection present - diagnosed instead with Left TM Perforation. Endorses hearing a roaring sound and still experiencing itching.  Regarding her Upper Endoscopy 05/16/2024 with Dr. Avram, noted trace grade I esophageal varices,   - Nodular vs polypoid mucosa ( friable, inflamed) in the gastric body and in the gastric antrum. Biopsied the body changes as these were new. Antrum looks similar to before and biopsies have been non- diagnostic. Suspect this is part  of portal gastropathy.  - Portal hypertensive gastropathy. Moderate, slightly friable.   - The examination was otherwise normal.   Eventually would like to repeat CT Neck, Abdomen, before her next office visit Started Venetoclax   - unable to tolerate higher doses Tolerating 100 mg currently with some nausea and diarrhea Diarrhea improved since stopping Metformin   Stopped taking Align probiotic  Hgb  Wbc  Lymph  Plts  IVIG monthly no major cough/cold IV iron  - Treating blood loss possibly related to liver cirrhosis   No NSAIDS  Updated Flu vaccination, PNA, RSV Vaccine Counseling: consider Covid-19 vaccination if she has not updated it or tested positive for Covid-19 in the last 6 months.  No scalp infections/boils  Tenderness on palpation - minor lymph node left-side of neck  No new axillary lumps/bumps, new back pain  Pain in arms  MEDICAL HISTORY:  Past Medical History:  Diagnosis Date  . Allergy   . Asthma    seasonal  . Cellulitis 2013   Left toe  . Chronic kidney disease    stage 1  . Cirrhosis (HCC)   . Common migraine    History of  . Complication of anesthesia    Per pt/had breathing problems with block during rotator cuff surgery. 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  . Coronary artery disease   . DDD (degenerative disc disease)   . Depression    denies takes paxil for migraines  . Diabetes mellitus    type 2  . Diabetic peripheral neuropathy (HCC)   . Difficult airway for intubation 10/20/2022   DLx 1 with MAC 4, unable to see cords, DL with lopro S3 glidescope, easy atraumatic intubation  . Difficult intubation  03/03/2023  . Diverticulitis   . Diverticulosis   . DJD (degenerative joint disease)   . Esophageal varices (HCC)   . GERD (gastroesophageal reflux disease)   . History of colon polyps    hyperplastic  . History of gastric polyp   . Hyperlipidemia   . Hypertension   . Iron  deficiency anemia   . Obesity   . OSA on CPAP     cpap  . Peripheral neuropathy   . Pneumonia    april 2020  mild  . Primary localized osteoarthritis of left knee 03/27/2019  . Sensorineural hearing loss   . 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HCC)     SURGICAL HISTORY: Past Surgical History:  Procedure Laterality Date  . ANKLE SURGERY     Left   . BREAST BIOPSY Left 2021   left axilla, lymphoma  . COLONOSCOPY  05/2018  . ESOPHAGOGASTRODUODENOSCOPY N/A 05/16/2024   Procedure: EGD (ESOPHAGOGASTRODUODENOSCOPY);  Surgeon: Avram Lupita BRAVO, MD;  Location: [facility];  Service: Gastroenterology;  Laterality: N/A;  . ESOPHAGOGASTRODUODENOSCOPY (EGD) WITH PROPOFOL  N/A 03/09/2023   Procedure: ESOPHAGOGASTRODUODENOSCOPY (EGD) WITH PROPOFOL ;  Surgeon: Avram Lupita BRAVO, MD;  Location: [facility];  Service: Gastroenterology;  Laterality: N/A;  . LEEP N/A 09/14/2018   Procedure: LOOP ELECTROSURGICAL EXCISION PROCEDURE (LEEP);  Surgeon: Leva Rush, MD;  Location: [facility];  Service: Gynecology;  Laterality: N/A;  . MASS EXCISION Right 06/24/2021   Procedure: EXCISION RIGHT POSTERIOR NECK MASS;  Surgeon: Kinsinger, Herlene Righter, MD;  Location: [facility];  Service: General;  Laterality: Right;  . ROTATOR CUFF REPAIR Bilateral 2012, 2015  . TONSILLECTOMY    . TOTAL KNEE ARTHROPLASTY Left 04/08/2019   Procedure: TOTAL KNEE ARTHROPLASTY;  Surgeon: Jane Charleston, MD;  Location: [facility];  Service: Orthopedics;  Laterality: Left;  . UMBILICAL HERNIA REPAIR N/A 10/20/2022   Procedure: OPEN HERNIA REPAIR UMBILICAL ADULT with Mesh;  Surgeon: Kinsinger, Herlene Righter, MD;  Location: [facility];  Service: General;  Laterality: N/A;  . UPPER GI ENDOSCOPY  05/2018    SOCIAL HISTORY: Social History   Socioeconomic History  . Marital status: Widowed    Spouse name: Not on file  . Number of children: 0  . Years of education: 3 years college  . Highest education level: Not on file  Occupational History  . Occupation: Unemployed  Tobacco Use  . Smoking  status: Never    Passive exposure: Past  . Smokeless tobacco: Never  Vaping Use  . Vaping status: Never Used  Substance and Sexual Activity  . Alcohol  use: Not Currently    Comment: very rare  . Drug use: No  . Sexual activity: Not Currently    Birth control/protection: None, Post-menopausal  Other Topics Concern  . Not on file  Social History Narrative   1 caffeine drink daily    Right-handed   widow (husband died from [place] cirrhosis)   Social Drivers of Health   Financial Resource Strain: Medium Risk (08/20/2020)   Overall Financial Resource Strain (CARDIA)   . Difficulty of Paying Living Expenses: Somewhat hard  Food Insecurity: No Food Insecurity (10/20/2022)   Hunger Vital Sign   . Worried About Programme Researcher, Broadcasting/film/video in the Last Year: Never true   . Ran Out of Food in the Last Year: Never true  Transportation Needs: No Transportation Needs (10/20/2022)   PRAPARE - Transportation   . Lack of Transportation (Medical): No   . Lack of Transportation (Non-Medical): No  Physical Activity: Not on file  Stress: Not on file  Social Connections: Unknown (01/25/2022)   [facility]   Social Network   . Social Network: Not on file  Intimate Partner Violence: Not At Risk (10/20/2022)   Humiliation, Afraid, Rape, and Kick questionnaire   . Fear of Current or Ex-Partner: No   . Emotionally Abused: No   . Physically Abused: No   . Sexually Abused: No    FAMILY HISTORY: Family History  Problem Relation Age of Onset  . Breast cancer Mother   . Bone cancer Mother   . Heart failure Father   . Diabetes Mellitus II Father   . Hypertension Father   . Diabetes Mellitus II Sister   . Obesity Sister   . Other Sister        retina problem  . Hypertension Brother   . Diabetes Mellitus II Brother   . Colon cancer Neg Hx   . Rectal cancer Neg Hx   . Stomach cancer Neg Hx   . Esophageal cancer Neg Hx   . Pancreatic cancer Neg Hx   . Liver disease Neg Hx     ALLERGIES:   is allergic to breo ellipta [fluticasone  furoate-vilanterol], gazyva  [obinutuzumab ], iodine , latex, tessalon  [benzonatate ], zocor [simvastatin], levaquin [levofloxacin], penicillins, povidone-iodine , wellbutrin [bupropion], amoxicillin, chlorhexidine , diflucan [fluconazole], thalitone  [chlorthalidone ], amoxicillin-pot clavulanate, and vibra-tab [doxycycline].  MEDICATIONS:  Current Outpatient Medications  Medication Sig Dispense Refill  . acetic acid  2 % otic solution Place 4 drops into the left ear 2 (two) times daily. 15 mL 1  . albuterol  (VENTOLIN  HFA) 108 (90 Base) MCG/ACT inhaler Inhale 1-2 puffs into the lungs every 6 (six) hours as needed (Cough). 18 g 0  . albuterol  (VENTOLIN  HFA) 108 (90 Base) MCG/ACT inhaler Inhale 1-2 puffs into the lungs every 6 (six) hours as needed and as directed for shortness of breath. 6.7 g 4  . b complex vitamins capsule Take 1 capsule by mouth in the morning.    . bacitracin  500 UNIT/GM ointment Apply topically 2 (two) times daily for 10 days 30 g 0  . budesonide -formoterol  (SYMBICORT ) 80-4.5 MCG/ACT inhaler Inhale 2 puffs into the lungs daily. 10.2 g 12  . Carboxymethylcellul-Glycerin (REFRESH OPTIVE PF OP) Place 1 drop into both eyes 3 (three) times daily as needed (dry/irritated eyes.). Non-pres    . cephALEXin  (KEFLEX ) 500 MG capsule Take 2 capsules (1,000 mg total) by mouth as directed 1 hour prior to dental work. 20 capsule 0  . Coenzyme Q10 (CO Q-10) 100 MG CAPS Take 100 mg by mouth in the morning.    . Continuous Glucose Sensor (FREESTYLE LIBRE 2 SENSOR) MISC Replace every 14 days and use to check blood sugar continuously. 2 each 3  . Continuous Glucose Sensor (FREESTYLE LIBRE 2 SENSOR) MISC Replace every 14 days and use to check blood sugar continuously. 6 each 3  . desoximetasone  (TOPICORT ) 0.25 % cream Apply a small amount to skin twice a day as needed for flares 60 g 0  . diclofenac  Sodium (VOLTAREN  ARTHRITIS PAIN) 1 % GEL Apply topically 4 (four)  times daily to bilateral knees and ankles 200 g 5  . diclofenac  sodium (VOLTAREN ) 1 % GEL Apply 2 g topically 2 (two) times daily as needed (knee pain/ foot /Triger finger).  2  . empagliflozin  (JARDIANCE ) 10 MG TABS tablet Take 1 tablet (10 mg total) by mouth daily. 90 tablet 3  . fluticasone  (FLONASE ) 50 MCG/ACT nasal spray Place 2 sprays into both nostrils in the morning. 48  g 3  . furosemide  (LASIX ) 20 MG tablet Take 1 tablet (20 mg total) by mouth as needed. Take if you gain 1 lb in 24 hours, or 3 lbs in 1 week. Take it daily and stop when weight back to baseline. 30 tablet 6  . gabapentin  (NEURONTIN ) 300 MG capsule Take 2 capsules (600 mg total) by mouth at bedtime. 180 capsule 3  . glucose blood (ONETOUCH VERIO) test strip Use as directed to check blood sugar 3 times a day. 300 strip 3  . glucose blood test strip Use to test blood sugar 3 times daily as directed 300 each 3  . hydrocortisone  (ANUSOL -HC) 25 MG suppository Place 1 suppository (25 mg total) rectally daily. 30 suppository 0  . insulin  aspart (NOVOLOG  FLEXPEN) 100 UNIT/ML FlexPen Inject 30 Units into the skin daily with breakfast AND 30 Units daily with lunch AND 30 Units daily with supper. 45 mL 3  . insulin  aspart (NOVOLOG  FLEXPEN) 100 UNIT/ML FlexPen Inject 30 Units into the skin daily with breakfast AND 30 Units daily with lunch AND 30 Units daily with supper. 90 mL 3  . insulin  aspart (NOVOLOG  FLEXPEN) 100 UNIT/ML FlexPen Inject 70 units under the skin with meals 3 times a day (add ISF 10, target 200 - up to 300 units/day) 65 mL 3  . insulin  degludec (TRESIBA  FLEXTOUCH) 200 UNIT/ML FlexTouch Pen Inject 80 Units into the skin daily. 30 mL 5  . insulin  degludec (TRESIBA  FLEXTOUCH) 200 UNIT/ML FlexTouch Pen Inject 92 Units into the skin once daily. 42 mL 3  . Insulin  Pen Needle 32G X 4 MM MISC Use as directed 5 (five) times daily. 400 each 3  . irbesartan  (AVAPRO ) 300 MG tablet Take 1 tablet (300 mg total) by mouth every evening.     . irbesartan  (AVAPRO ) 300 MG tablet Take 1 tablet (300 mg total) by mouth in the morning. 90 tablet 3  . irbesartan  (AVAPRO ) 300 MG tablet Take 1 tablet (300 mg total) by mouth every evening. 90 tablet 3  . Lancets (ONETOUCH DELICA PLUS LANCET33G) MISC Use to test blood sugar 3 times daily 300 each 2  . LORazepam  (ATIVAN ) 0.5 MG tablet Take 1 or 2 before MRI to reduce claustrophobia 2 tablet 0  . omeprazole  (PRILOSEC) 40 MG capsule Take 1 capsule (40 mg total) by mouth 2 (two) times daily. 180 capsule 3  . ondansetron  (ZOFRAN -ODT) 4 MG disintegrating tablet Dissolve 1 tablet (4 mg) by mouth every 8 hours as needed for nausea or vomiting. 20 tablet 0  . prednisoLONE  acetate (PRED FORTE ) 1 % ophthalmic suspension Place 1 drop into both eyes 4 (four) times daily. 10 mL 0  . prednisoLONE  acetate (PREDNISOLONE  ACETATE P-F) 1 % ophthalmic suspension Place 1 drop into both eyes 4 (four) times daily. 5 mL 0  . rosuvastatin  (CRESTOR ) 10 MG tablet Take 1 tablet (10 mg total) by mouth daily. 90 tablet 3  . sertraline  (ZOLOFT ) 50 MG tablet Take 1 tablet (50 mg total) by mouth daily. 90 tablet 3  . spironolactone  (ALDACTONE ) 25 MG tablet Take 25 mg by mouth daily.    SABRA terconazole (TERAZOL 3) 0.8 % vaginal cream Place 1 applicator vaginally daily as needed (yeast infections).    . venetoclax  (VENCLEXTA ) 100 MG tablet Take 1 tablet (100 mg total) by mouth daily. Tablets should be swallowed whole with a meal and a full glass of water . 30 tablet 3  . Zoster Vaccine Adjuvanted (SHINGRIX ) injection Inject into the muscle. 1  each 1   No current facility-administered medications for this visit.   REVIEW OF SYSTEMS:   10 Point review of Systems was done is negative except as noted above.  PHYSICAL EXAMINATION:  BP (!) 139/58   Pulse 67   Temp 97.9 F (36.6 C)   Resp 18   Wt 226 lb 3.2 oz (102.6 kg)   SpO2 96%   BMI 38.23 kg/m   GENERAL:alert, in no acute distress and comfortable SKIN: no acute rashes,  no significant lesions EYES: conjunctiva are pink and non-injected, sclera anicteric OROPHARYNX: MMM, no exudates, no oropharyngeal erythema or ulceration NECK: supple, no JVD LYMPH:  no palpable lymphadenopathy in the cervical, axillary or inguinal regions LUNGS: clear to auscultation b/l with normal respiratory effort HEART: regular rate & rhythm ABDOMEN:  normoactive bowel sounds , non tender, (+) mildly distended, palpable splenomegaly Extremity: no pedal edema PSYCH: alert & oriented x 3 with fluent speech NEURO: no focal motor/sensory deficits   LABORATORY DATA:  I have reviewed the data as listed  .    Latest Ref Rng & Units 06/24/2024   11:58 AM 06/20/2024    2:33 PM 05/27/2024    8:15 AM  CBC  WBC 4.0 - 10.5 K/uL 5.2  3.6  4.1   Hemoglobin 12.0 - 15.0 g/dL 87.5  87.7  88.3   Hematocrit 36.0 - 46.0 % 40.6  38.2  37.3   Platelets 150 - 400 K/uL 55  51.0  56    . CBC    Component Value Date/Time   WBC 5.2 06/24/2024 1158   WBC 3.6 (L) 06/20/2024 1433   RBC 4.96 06/24/2024 1158   HGB 12.4 06/24/2024 1158   HGB 10.3 (L) 10/18/2021 1222   HCT 40.6 06/24/2024 1158   HCT 33.0 (L) 10/18/2021 1222   PLT 55 (L) 06/24/2024 1158   PLT 100 (LL) 10/18/2021 1222   MCV 81.9 06/24/2024 1158   MCV 84 10/18/2021 1222   MCH 25.0 (L) 06/24/2024 1158   MCHC 30.5 06/24/2024 1158   RDW 15.9 (H) 06/24/2024 1158   RDW 15.1 10/18/2021 1222   LYMPHSABS 0.7 06/24/2024 1158   MONOABS 0.5 06/24/2024 1158   EOSABS 0.1 06/24/2024 1158   BASOSABS 0.0 06/24/2024 1158     .    Latest Ref Rng & Units 06/24/2024   11:58 AM 05/27/2024    8:15 AM 04/29/2024   11:23 AM  CMP  Glucose 70 - 99 mg/dL 825  63  845   BUN 8 - 23 mg/dL 19  25  23    Creatinine 0.44 - 1.00 mg/dL 9.05  9.05  8.92   Sodium 135 - 145 mmol/L 141  142  134   Potassium 3.5 - 5.1 mmol/L 4.3  4.2  5.4   Chloride 98 - 111 mmol/L 107  109  104   CO2 22 - 32 mmol/L 26  27  24    Calcium  8.9 - 10.3 mg/dL 9.6  9.0  8.9    Total Protein 6.5 - 8.1 g/dL 6.9  6.8  6.5   Total Bilirubin 0.0 - 1.2 mg/dL 0.6  0.5  0.6   Alkaline Phos 38 - 126 U/L 92  85  85   AST 15 - 41 U/L 22  19  20    ALT 0 - 44 U/L 27  23  25     LDH Lab Results  Component Value Date   LDH 155 05/27/2024   Iron  Panel Lab Results  Component Value  Date   IRON  39 05/27/2024   UIBC 479 (H) 05/27/2024   TIBC 518 (H) 05/27/2024   IRONPCTSAT 8 (L) 05/27/2024   FERRITIN 17 05/27/2024   Uric Acid Lab Results  Component Value Date   LABURIC 7.4 (H) 06/24/2024   04/08/2020 FISH/CLL Panel:   03/30/2020 Left Axilla Flow Pathology Report [id number]   03/30/2020 Left axilla lymph node Bx ([id number]):   Surgical Pathology 05/16/2024 Surgical Pathology Report   Clinical History: Esophageal varices, cirrhosis, R/O neoplasia (las)   FINAL MICROSCOPIC DIAGNOSIS:  A. GASTRIC POLYP, BODY; POLYPECTOMY: - Polypoid fragments of gastric mucosa with edema, foveolar hyperplasia, and bands of smooth muscle in the lamina propria. see comment. - Negative for H. pylori, intestinal metaplasia, dysplasia, and malignancy.  Comment: The differential diagnosis for the stomach polyp includes hamartomatous polyp versus inflammatory / hyperplastic polyp with superimposed prolapse-type change. Background / non-polyp mucosa is not present for evaluation. Correlation with clinical impression and endoscopic findings is required. If clinically appropriate syndromes involving hamartomatous GI polyps (e.g. Peutz-Jeghers syndrome, juvenile polyposis syndrome, Cowden syndrome) should be reasonably excluded.  GROSS DESCRIPTION:  Received in formalin are tan, soft tissue fragments that are submitted in toto. Number: Multiple size: Range from 0.2 to 0.5 cm blocks: 1 SHIRLEEN 05/16/2024)    Final Diagnosis performed by Rexene Daily, MD.   Electronically signed 05/20/2024 Technical component performed at [facility], [address], [place], [place] [address].  Professional component performed at [facility]. [address] [place], [place] [address]  Immunohistochemistry Technical component (if applicable) was performed at [facility]. [address], [address], [place], [place] [address].  IMMUNOHISTOCHEMISTRY DISCLAIMER (if applicable): Some of these immunohistochemical stains may have been developed and the performance characteristics determine by [facility]. Some may not have been cleared or approved by the U.S. Food and Drug Administration. The FDA has determined that such clearance or approval is not necessary. This test is used for clinical purposes. It should not be regarded as investigational or for research. This laboratory is certified under the Clinical Laboratory Improvement Amendments of 1988 (CLIA-88) as qualified to perform high complexity clinical laboratory testing.  The controls stained appropriately.     RADIOGRAPHIC STUDIES: I have personally reviewed the radiological images as listed and agreed with the findings in the report. MR LIVER W WO CONTRAST Result Date: 03/31/2024 EXAM: MRCP WITH AND WITHOUT IV CONTRAST 03/30/2024 09:25:15 AM TECHNIQUE: Multisequence, multiplanar magnetic resonance images of the abdomen with and without intravenous contrast. MRCP sequences were performed. COMPARISON: Abdominal ultrasound dated 03/25/2024 and CT abdomen/pelvis dated 08/22/2023. CLINICAL HISTORY: Abnormal gallbladder on ultrasound possible polyps, Cirrhosis. FINDINGS: LIVER: Cirrhosis. Heterogeneous arterial enhancement of the liver which normalizes on delayed phases. However, there is a 16 mm lesion in the anterior right hepatic dome (series 15 image 16) which favored a benign 10 mm cyst in 2022 but is technically indeterminate on the current study. However, given relative stability, a benign etiology is favored. GALLBLADDER AND BILIARY SYSTEM:  Gallbladder is within normal limits on MR. No intrahepatic or extrahepatic ductal dilation. SPLEEN: Splenomegaly. PANCREAS/PANCREATIC DUCT: Visualized pancreas is unremarkable. No pancreatic ductal dilatation. ADRENAL GLANDS: Unremarkable. KIDNEYS: Unremarkable. LYMPH NODES: Para-aortic lymphadenopathy measuring up to 18 mm short axis (series 11 image 76), mildly improved. VASULATURE: Unremarkable. PERITONEUM: No ascites. ABDOMINAL WALL: No hernia. No mass. BOWEL: Grossly unremarkable. No bowel obstruction. BONES: No acute abnormality or worrisome osseous lesion. SOFT TISSUES: Unremarkable. MISCELLANEOUS: Unremarkable. IMPRESSION: 1. Gallbladder is  within normal limits on MR. 2. Cirrhosis. No findings suspicious for HCC. 3. Small para-aortic lymphadenopathy, related to the patient's known CLL, mildly improved. 4. Splenomegaly. Electronically signed by: Pinkie Pebbles MD 03/31/2024 10:33 PM EDT RP Workstation: [id number]   MRI Abdomen With And Without Contrast 07/08/2021 showed IMPRESSION: 1. Moderate motion and patient body habitus degraded exam. 2. Extensive abdominal adenopathy, similar to slightly increased compared to 05/01/2020 PET. 3. Cirrhosis and hepatosplenomegaly with suspicion of periesophageal varices. 4. Nonspecific hypoenhancing or nonenhancing splenic lesions. These could simply represent Ora Orange bodies in the setting of cirrhosis. Lymphomatous involvement cannot be excluded. 5. Heterogeneous enhancement throughout the liver, without well-defined suspicious mass. Possibly due to altered perfusion in the setting of cirrhosis and porta hepatis adenopathy. Lymphomatous involvement cannot be excluded. Consider pre and post-contrast MRI follow-up at 2-3 months. A more aggressive approach would include random liver biopsy.  MRI abdomen with and without contrast 02/04/2022 showed IMPRESSION: 1. Examination is generally limited by breath motion artifact, particularly multiphasic  contrast enhanced sequences. Within this limitation, cirrhotic morphology of the liver without focal liver lesion. 2. 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 3. Trace ascites. 4. Numerous enlarged gastrohepatic ligament, portacaval, retroperitoneal, and small bowel mesenteric lymph nodes are unchanged, in keeping with history of lymphoma.  ASSESSMENT & PLAN:   73 yo female with   1) CLL/SLL -02/28/2020 MM Breast ([id number]) revealed Bilateral axillary adenopathy. -03/30/2020 Left axilla lymph node biopsy ([id number]) revealed CHRONIC LYMPHOCYTIC LYMPHOMA/SMALL LYMPHOCYTIC LYMPHOMA.  -03/30/2020 Left Axilla Flow Pathology Report [id number]) revealed Monoclonal B-cell population with coexpression of CD5 comprises 80% of all lymphocytes. -04/20/20 PET/CT revealed 1. Adenopathy within the neck, chest, abdomen, and pelvis, consistent with active lymphoma. Much of this is not significantly hypermetabolic. Some upper abdominal nodes are moderately hypermetabolic.  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 NO overt GI bleeding noted.    PLAN:  - Discussed available lab results from 04/29/2024 CBC shows stable WBC count of 5.2k with no lymphocytosis, hemoglobin 11.4 and chronic thrombocytopenia with platelets of 60k CMP stable potassium still elevated at 5.4 stable kidney function LDH within normal limits at 147 Iron  deficiency noted with a ferritin of 23 and iron  saturation of 11%. Given her liver cirrhosis and  risk of chronic GI losses would offer her the option of IV iron  replacement. Patient's diarrhea has improved after getting off the metformin  but there is still some grade 1 diarrhea.  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  It is possible that there is some effect from her low-dose venetoclax . - Patient notes the diarrhea is manageable and she would like to continue her low-dose venetoclax  at 100 mg p.o. daily which is helping her with her CLL control. - She will explore other possibilities of diarrhea with her gastroenterologist. - She is scheduled for an EGD with Dr. Avram on  9//2025. - No significant infection issues.  We shall continue her IVIG every 4 weeks for hypogammaglobinemia. - She is recommended to stay up-to-date with her age-appropriate vaccinations including her flu shot and COVID-19 vaccinations.  FOLLOW-UP: Continue IVIG every 4 weeks IV Venofer  currently milligrams weekly x 3 doses RTC with Dr Onesimo with labs in 2 months   Continue ivig as scheduled today   The total time spent in the appointment was 30 minutes* .  All of the patient's questions were answered with apparent satisfaction. The patient knows to call the clinic with any problems, questions or concerns.   Emaline Onesimo MD MS AAHIVMS [facility] Hematology/Oncology Physician [facility]  .*Total Encounter Time as defined by the Centers for Medicare and Medicaid Services includes, in addition to the face-to-face time of a patient visit (documented in the note above) non-face-to-face time: obtaining and reviewing outside history, ordering and reviewing medications, tests or procedures, care coordination (communications with other health care professionals or caregivers) and documentation in the medical record.    I, Damien Blanks, acting as a neurosurgeon for Emaline Onesimo, MD.,have documented all relevant documentation  on the behalf of Emaline Onesimo, MD,as directed by  Emaline Onesimo, MD while in the presence of Emaline Onesimo, MD.  .I have reviewed the above documentation for accuracy and completeness, and I agree with the above. .Charl Wellen Kishore Cedrick Partain MD

## 2024-07-02 ENCOUNTER — Other Ambulatory Visit (HOSPITAL_COMMUNITY): Payer: Self-pay

## 2024-07-02 ENCOUNTER — Other Ambulatory Visit: Payer: Self-pay | Admitting: Internal Medicine

## 2024-07-02 ENCOUNTER — Other Ambulatory Visit: Payer: Self-pay

## 2024-07-02 MED ORDER — SPIRONOLACTONE 25 MG PO TABS
25.0000 mg | ORAL_TABLET | Freq: Every day | ORAL | 3 refills | Status: AC
  Filled 2024-07-03 – 2024-09-26 (×2): qty 90, 90d supply, fill #0

## 2024-07-02 MED ORDER — SPIRONOLACTONE 25 MG PO TABS
25.0000 mg | ORAL_TABLET | Freq: Every day | ORAL | 3 refills | Status: DC
  Filled 2024-07-02: qty 90, 90d supply, fill #0

## 2024-07-02 NOTE — Telephone Encounter (Signed)
 Please advise Sir on this request. Thank you.

## 2024-07-03 ENCOUNTER — Other Ambulatory Visit (HOSPITAL_COMMUNITY): Payer: Self-pay

## 2024-07-03 ENCOUNTER — Other Ambulatory Visit: Payer: Self-pay

## 2024-07-04 ENCOUNTER — Ambulatory Visit

## 2024-07-04 ENCOUNTER — Other Ambulatory Visit (HOSPITAL_COMMUNITY): Payer: Self-pay

## 2024-07-06 ENCOUNTER — Ambulatory Visit: Payer: Self-pay | Admitting: Cardiology

## 2024-07-08 ENCOUNTER — Other Ambulatory Visit (HOSPITAL_COMMUNITY): Payer: Self-pay

## 2024-07-09 ENCOUNTER — Encounter: Payer: Self-pay | Admitting: Hematology

## 2024-07-09 ENCOUNTER — Encounter: Payer: Self-pay | Admitting: Physician Assistant

## 2024-07-09 ENCOUNTER — Other Ambulatory Visit (HOSPITAL_COMMUNITY): Payer: Self-pay

## 2024-07-09 ENCOUNTER — Telehealth: Payer: Self-pay

## 2024-07-09 NOTE — Telephone Encounter (Signed)
 Oral Oncology Patient Advocate Encounter  Was successful in securing patient a $8000 grant from [facility] to provide copayment coverage for Venclexta .  This will keep the out of pocket expense at $0.     [employer] ID: [id number]   The billing information is as follows and has been shared with [facility].    RxBin: [id number] PCN: [id number] Member ID: [id number] Group ID: [id number] Dates of Eligibility: 08/04/24 through 08/03/25  Fund:  [facility]  Lucie Lamer, CPhT [place]  [facility] Oncology Pharmacy Patient Advocate Specialist II [place] Phone: [phone]  Fax: [phone] [email or web address]

## 2024-07-10 ENCOUNTER — Other Ambulatory Visit: Payer: Self-pay

## 2024-07-10 ENCOUNTER — Other Ambulatory Visit (HOSPITAL_COMMUNITY): Payer: Self-pay

## 2024-07-10 DIAGNOSIS — L821 Other seborrheic keratosis: Secondary | ICD-10-CM | POA: Diagnosis not present

## 2024-07-10 DIAGNOSIS — L309 Dermatitis, unspecified: Secondary | ICD-10-CM | POA: Diagnosis not present

## 2024-07-10 DIAGNOSIS — L218 Other seborrheic dermatitis: Secondary | ICD-10-CM | POA: Diagnosis not present

## 2024-07-10 MED ORDER — DESOXIMETASONE 0.25 % EX CREA
1.0000 | TOPICAL_CREAM | Freq: Two times a day (BID) | CUTANEOUS | 0 refills | Status: AC
  Filled 2024-07-10: qty 60, 30d supply, fill #0

## 2024-07-11 ENCOUNTER — Ambulatory Visit

## 2024-07-13 ENCOUNTER — Other Ambulatory Visit (HOSPITAL_COMMUNITY): Payer: Self-pay

## 2024-07-15 ENCOUNTER — Ambulatory Visit (INDEPENDENT_AMBULATORY_CARE_PROVIDER_SITE_OTHER)

## 2024-07-15 VITALS — BP 112/66 | HR 73 | Temp 98.3°F | Resp 12 | Ht 64.5 in | Wt 228.2 lb

## 2024-07-15 DIAGNOSIS — D508 Other iron deficiency anemias: Secondary | ICD-10-CM | POA: Diagnosis not present

## 2024-07-15 MED ORDER — SODIUM CHLORIDE 0.9% FLUSH: 10.0000 mL | Freq: Once | INTRAVENOUS | Status: DC | PRN

## 2024-07-15 MED ORDER — HEPARIN SOD (PORK) LOCK FLUSH 100 UNIT/ML IV SOLN: 250.0000 [IU] | Freq: Once | INTRAVENOUS | Status: DC | PRN

## 2024-07-15 MED ORDER — ALTEPLASE 2 MG IJ SOLR: 2.0000 mg | Freq: Once | INTRAMUSCULAR | Status: DC | PRN

## 2024-07-15 MED ORDER — SODIUM CHLORIDE 0.9 % IV SOLN
300.0000 mg | INTRAVENOUS | Status: DC
  Administered 2024-07-15: 300 mg via INTRAVENOUS
  Filled 2024-07-15: qty 15

## 2024-07-15 MED ORDER — HEPARIN SOD (PORK) LOCK FLUSH 100 UNIT/ML IV SOLN: 500.0000 [IU] | Freq: Once | INTRAVENOUS | Status: DC | PRN

## 2024-07-15 MED ORDER — SODIUM CHLORIDE 0.9% FLUSH: 3.0000 mL | Freq: Once | INTRAVENOUS | Status: DC | PRN

## 2024-07-15 MED ORDER — ANTICOAGULANT SODIUM CITRATE 4% (200MG/5ML) IV SOLN: 5.0000 mL | Freq: Once | Status: DC | PRN

## 2024-07-15 NOTE — Progress Notes (Signed)
 Diagnosis: Iron  Deficiency Anemia  Provider:  Praveen Mannam MD  Procedure: IV Infusion  IV Type: Peripheral, IV Location: R Forearm  Venofer  (Iron  Sucrose), Dose: 300 mg  Infusion Start Time: 1037  Infusion Stop Time: 1218  Post Infusion IV Care: Patient declined observation and Peripheral IV Discontinued  Discharge: Condition: Good, Destination: Home . AVS Declined  Performed by:  Erabella Kuipers, RN

## 2024-07-16 ENCOUNTER — Other Ambulatory Visit: Payer: Self-pay

## 2024-07-16 ENCOUNTER — Other Ambulatory Visit: Payer: Self-pay | Admitting: Nurse Practitioner

## 2024-07-16 DIAGNOSIS — C8300 Small cell B-cell lymphoma, unspecified site: Secondary | ICD-10-CM

## 2024-07-16 DIAGNOSIS — D500 Iron deficiency anemia secondary to blood loss (chronic): Secondary | ICD-10-CM

## 2024-07-16 DIAGNOSIS — R110 Nausea: Secondary | ICD-10-CM

## 2024-07-16 DIAGNOSIS — C9110 Chronic lymphocytic leukemia of B-cell type not having achieved remission: Secondary | ICD-10-CM

## 2024-07-16 MED ORDER — ONDANSETRON 4 MG PO TBDP
4.0000 mg | ORAL_TABLET | Freq: Three times a day (TID) | ORAL | 0 refills | Status: AC | PRN
  Filled 2024-07-16: qty 20, 7d supply, fill #0

## 2024-07-17 ENCOUNTER — Encounter (INDEPENDENT_AMBULATORY_CARE_PROVIDER_SITE_OTHER): Payer: PPO | Admitting: Ophthalmology

## 2024-07-17 DIAGNOSIS — H35033 Hypertensive retinopathy, bilateral: Secondary | ICD-10-CM

## 2024-07-17 DIAGNOSIS — E113293 Type 2 diabetes mellitus with mild nonproliferative diabetic retinopathy without macular edema, bilateral: Secondary | ICD-10-CM | POA: Diagnosis not present

## 2024-07-17 DIAGNOSIS — I10 Essential (primary) hypertension: Secondary | ICD-10-CM | POA: Diagnosis not present

## 2024-07-17 DIAGNOSIS — Z794 Long term (current) use of insulin: Secondary | ICD-10-CM

## 2024-07-17 DIAGNOSIS — H43813 Vitreous degeneration, bilateral: Secondary | ICD-10-CM | POA: Diagnosis not present

## 2024-07-22 ENCOUNTER — Other Ambulatory Visit: Payer: Self-pay

## 2024-07-22 ENCOUNTER — Inpatient Hospital Stay

## 2024-07-22 ENCOUNTER — Inpatient Hospital Stay: Attending: Hematology | Admitting: *Deleted

## 2024-07-22 DIAGNOSIS — Z79899 Other long term (current) drug therapy: Secondary | ICD-10-CM | POA: Insufficient documentation

## 2024-07-22 DIAGNOSIS — D509 Iron deficiency anemia, unspecified: Secondary | ICD-10-CM | POA: Insufficient documentation

## 2024-07-22 DIAGNOSIS — Z7951 Long term (current) use of inhaled steroids: Secondary | ICD-10-CM | POA: Insufficient documentation

## 2024-07-22 DIAGNOSIS — K766 Portal hypertension: Secondary | ICD-10-CM | POA: Insufficient documentation

## 2024-07-22 DIAGNOSIS — K3189 Other diseases of stomach and duodenum: Secondary | ICD-10-CM | POA: Insufficient documentation

## 2024-07-22 DIAGNOSIS — R188 Other ascites: Secondary | ICD-10-CM | POA: Insufficient documentation

## 2024-07-22 DIAGNOSIS — K7460 Unspecified cirrhosis of liver: Secondary | ICD-10-CM | POA: Insufficient documentation

## 2024-07-22 DIAGNOSIS — R590 Localized enlarged lymph nodes: Secondary | ICD-10-CM | POA: Insufficient documentation

## 2024-07-22 DIAGNOSIS — D801 Nonfamilial hypogammaglobulinemia: Secondary | ICD-10-CM | POA: Insufficient documentation

## 2024-07-22 DIAGNOSIS — C9110 Chronic lymphocytic leukemia of B-cell type not having achieved remission: Secondary | ICD-10-CM | POA: Insufficient documentation

## 2024-07-22 DIAGNOSIS — R161 Splenomegaly, not elsewhere classified: Secondary | ICD-10-CM | POA: Insufficient documentation

## 2024-07-22 DIAGNOSIS — D696 Thrombocytopenia, unspecified: Secondary | ICD-10-CM | POA: Insufficient documentation

## 2024-07-22 DIAGNOSIS — Z7982 Long term (current) use of aspirin: Secondary | ICD-10-CM | POA: Insufficient documentation

## 2024-07-22 DIAGNOSIS — Z7969 Long term (current) use of other immunomodulators and immunosuppressants: Secondary | ICD-10-CM | POA: Insufficient documentation

## 2024-07-22 NOTE — Progress Notes (Signed)
 [facility] Healthcare Advance Directives Clinical Social Work  Patient presented to Advance Directives Clinic  to review and complete healthcare advance directives.  Clinical Social Worker met with patient in office.  The patient designated niece, Lucienne Burnet as their primary healthcare agent and sister Inocente Pesa as their secondary agent.  Patient also completed healthcare living will.    Documents were notarized and copies made for patient/family. Clinical Social Worker will send documents to medical records to be scanned into patient's chart. Clinical Social Worker encouraged patient/family to contact with any additional questions or concerns.   Tinnie Ro, LCSW Clinical Social Worker [facility]

## 2024-07-24 ENCOUNTER — Ambulatory Visit

## 2024-07-24 ENCOUNTER — Other Ambulatory Visit: Payer: Self-pay

## 2024-07-24 DIAGNOSIS — D500 Iron deficiency anemia secondary to blood loss (chronic): Secondary | ICD-10-CM

## 2024-07-24 DIAGNOSIS — C9110 Chronic lymphocytic leukemia of B-cell type not having achieved remission: Secondary | ICD-10-CM

## 2024-07-24 DIAGNOSIS — Z1212 Encounter for screening for malignant neoplasm of rectum: Secondary | ICD-10-CM | POA: Diagnosis not present

## 2024-07-24 DIAGNOSIS — D509 Iron deficiency anemia, unspecified: Secondary | ICD-10-CM | POA: Diagnosis not present

## 2024-07-24 DIAGNOSIS — C8300 Small cell B-cell lymphoma, unspecified site: Secondary | ICD-10-CM

## 2024-07-25 ENCOUNTER — Other Ambulatory Visit: Payer: Self-pay

## 2024-07-25 ENCOUNTER — Inpatient Hospital Stay: Attending: Hematology

## 2024-07-25 ENCOUNTER — Inpatient Hospital Stay

## 2024-07-25 VITALS — BP 134/57 | HR 64 | Temp 99.5°F | Resp 18

## 2024-07-25 DIAGNOSIS — D500 Iron deficiency anemia secondary to blood loss (chronic): Secondary | ICD-10-CM

## 2024-07-25 DIAGNOSIS — R161 Splenomegaly, not elsewhere classified: Secondary | ICD-10-CM | POA: Diagnosis not present

## 2024-07-25 DIAGNOSIS — D696 Thrombocytopenia, unspecified: Secondary | ICD-10-CM | POA: Diagnosis not present

## 2024-07-25 DIAGNOSIS — R590 Localized enlarged lymph nodes: Secondary | ICD-10-CM | POA: Diagnosis not present

## 2024-07-25 DIAGNOSIS — Z79899 Other long term (current) drug therapy: Secondary | ICD-10-CM | POA: Diagnosis not present

## 2024-07-25 DIAGNOSIS — C9110 Chronic lymphocytic leukemia of B-cell type not having achieved remission: Secondary | ICD-10-CM

## 2024-07-25 DIAGNOSIS — D509 Iron deficiency anemia, unspecified: Secondary | ICD-10-CM | POA: Diagnosis not present

## 2024-07-25 DIAGNOSIS — Z7969 Long term (current) use of other immunomodulators and immunosuppressants: Secondary | ICD-10-CM | POA: Diagnosis not present

## 2024-07-25 DIAGNOSIS — Z7982 Long term (current) use of aspirin: Secondary | ICD-10-CM | POA: Diagnosis not present

## 2024-07-25 DIAGNOSIS — C8300 Small cell B-cell lymphoma, unspecified site: Secondary | ICD-10-CM

## 2024-07-25 DIAGNOSIS — Z7951 Long term (current) use of inhaled steroids: Secondary | ICD-10-CM | POA: Diagnosis not present

## 2024-07-25 DIAGNOSIS — K766 Portal hypertension: Secondary | ICD-10-CM | POA: Diagnosis not present

## 2024-07-25 DIAGNOSIS — D801 Nonfamilial hypogammaglobulinemia: Secondary | ICD-10-CM

## 2024-07-25 DIAGNOSIS — R188 Other ascites: Secondary | ICD-10-CM | POA: Diagnosis not present

## 2024-07-25 DIAGNOSIS — K3189 Other diseases of stomach and duodenum: Secondary | ICD-10-CM | POA: Diagnosis not present

## 2024-07-25 DIAGNOSIS — K7460 Unspecified cirrhosis of liver: Secondary | ICD-10-CM | POA: Diagnosis not present

## 2024-07-25 LAB — CMP (CANCER CENTER ONLY)
ALT: 23 U/L (ref 0–44)
AST: 23 U/L (ref 15–41)
Albumin: 4.1 g/dL (ref 3.5–5.0)
Alkaline Phosphatase: 86 U/L (ref 38–126)
Anion gap: 7 (ref 5–15)
BUN: 15 mg/dL (ref 8–23)
CO2: 25 mmol/L (ref 22–32)
Calcium: 8.9 mg/dL (ref 8.9–10.3)
Chloride: 109 mmol/L (ref 98–111)
Creatinine: 0.85 mg/dL (ref 0.44–1.00)
GFR, Estimated: >60 mL/min (ref >60)
Glucose, Bld: 160 mg/dL — ABNORMAL HIGH (ref 70–99)
Potassium: 3.9 mmol/L (ref 3.5–5.1)
Sodium: 141 mmol/L (ref 135–145)
Total Bilirubin: 0.6 mg/dL (ref 0.0–1.2)
Total Protein: 6.4 g/dL — ABNORMAL LOW (ref 6.5–8.1)

## 2024-07-25 LAB — CBC WITH DIFFERENTIAL (CANCER CENTER ONLY)
Abs Immature Granulocytes: 0.06 K/uL (ref 0.00–0.07)
Basophils Absolute: 0 K/uL (ref 0.0–0.1)
Basophils Relative: 1 %
Eosinophils Absolute: 0.1 K/uL (ref 0.0–0.5)
Eosinophils Relative: 2 %
HCT: 40 % (ref 36.0–46.0)
Hemoglobin: 12.4 g/dL (ref 12.0–15.0)
Immature Granulocytes: 1 %
Lymphocytes Relative: 15 %
Lymphs Abs: 0.6 K/uL — ABNORMAL LOW (ref 0.7–4.0)
MCH: 25.3 pg — ABNORMAL LOW (ref 26.0–34.0)
MCHC: 31 g/dL (ref 30.0–36.0)
MCV: 81.5 fL (ref 80.0–100.0)
Monocytes Absolute: 0.4 K/uL (ref 0.1–1.0)
Monocytes Relative: 10 %
Neutro Abs: 3.1 K/uL (ref 1.7–7.7)
Neutrophils Relative %: 71 %
Platelet Count: 48 K/uL — ABNORMAL LOW (ref 150–400)
RBC: 4.91 MIL/uL (ref 3.87–5.11)
RDW: 16.9 % — ABNORMAL HIGH (ref 11.5–15.5)
WBC Count: 4.3 K/uL (ref 4.0–10.5)
nRBC: 0 % (ref 0.0–0.2)

## 2024-07-25 LAB — IRON AND IRON BINDING CAPACITY (CC-WL,HP ONLY)
Iron: 54 ug/dL (ref 28–170)
Saturation Ratios: 13 % (ref 10.4–31.8)
TIBC: 413 ug/dL (ref 250–450)
UIBC: 359 ug/dL (ref 148–442)

## 2024-07-25 LAB — FERRITIN: Ferritin: 85 ng/mL (ref 11–307)

## 2024-07-25 LAB — URIC ACID: Uric Acid, Serum: 7.1 mg/dL (ref 2.5–7.1)

## 2024-07-25 LAB — LACTATE DEHYDROGENASE: LDH: 157 U/L (ref 105–235)

## 2024-07-25 MED ORDER — ONDANSETRON HCL 4 MG/2ML IJ SOLN
4.0000 mg | Freq: Once | INTRAMUSCULAR | Status: AC
  Administered 2024-07-25: 4 mg via INTRAVENOUS
  Filled 2024-07-25: qty 2

## 2024-07-25 MED ORDER — ACETAMINOPHEN 325 MG PO TABS
650.0000 mg | ORAL_TABLET | Freq: Once | ORAL | Status: AC
  Administered 2024-07-25: 650 mg via ORAL
  Filled 2024-07-25: qty 2

## 2024-07-25 MED ORDER — METHYLPREDNISOLONE SODIUM SUCC 125 MG IJ SOLR
40.0000 mg | Freq: Once | INTRAMUSCULAR | Status: AC
  Administered 2024-07-25: 40 mg via INTRAVENOUS
  Filled 2024-07-25: qty 2

## 2024-07-25 MED ORDER — IMMUNE GLOBULIN (HUMAN) 10 GM/100ML IV SOLN
400.0000 mg/kg | Freq: Once | INTRAVENOUS | Status: AC
  Administered 2024-07-25: 40 g via INTRAVENOUS
  Filled 2024-07-25: qty 400

## 2024-07-25 MED ORDER — DIPHENHYDRAMINE HCL 25 MG PO CAPS
25.0000 mg | ORAL_CAPSULE | Freq: Once | ORAL | Status: AC
  Administered 2024-07-25: 25 mg via ORAL
  Filled 2024-07-25: qty 1

## 2024-07-25 MED ORDER — DEXTROSE 5 % IV SOLN: INTRAVENOUS | Status: DC

## 2024-07-25 NOTE — Progress Notes (Signed)
 Specialty Pharmacy Refill Coordination Note  Janice [name] is a 73 y.o. female contacted today regarding refills of specialty medication(s) Venetoclax  (VENCLEXTA )   Patient requested Delivery   Delivery date: 07/31/24   Verified address: [name] [address]   Medication will be filled on: 07/30/24

## 2024-07-25 NOTE — Patient Instructions (Signed)
 Immune Globulin  Injection What is this medication? IMMUNE GLOBULIN  (im MUNE GLOB yoo lin) treats many immune system conditions. It works by Designer, multimedia extra antibodies. Antibodies are proteins made by the immune system that help protect the body. This medicine may be used for other purposes; ask your health care provider or pharmacist if you have questions. COMMON BRAND NAME(S): ASCENIV, Baygam, BIVIGAM, Carimune, Carimune NF, cutaquig, Cuvitru, Flebogamma, Flebogamma DIF, GamaSTAN, GamaSTAN S/D, Gamimune N, Gammagard, Gammagard S/D, Gammaked, Gammaplex, Gammar-P IV, Gamunex, Gamunex-C, Hizentra, Iveegam, Iveegam EN, Octagam, Panglobulin, Panglobulin NF, panzyga, Polygam S/D, Privigen , Sandoglobulin, Venoglobulin-S, Vigam, Vivaglobulin, Xembify What should I tell my care team before I take this medication? They need to know if you have any of these conditions: Blood clotting disorder Condition where you have excess fluid in your body, such as heart failure or edema Dehydration Diabetes Have had blood clots Heart disease Immune system conditions Kidney disease Low levels of IgA Recent or upcoming vaccine An unusual or allergic reaction to immune globulin , other medications, foods, dyes, or preservatives Pregnant or trying to get pregnant Breastfeeding How should I use this medication? This medication is infused into a vein or under the skin. It may also be injected into a muscle. It is usually given by your care team in a hospital or clinic setting. It may also be given at home. If you get this medication at home, you will be taught how to prepare and give it. Take it as directed on the prescription label. Keep taking it unless your care team tells you to stop. It is important that you put your used needles and syringes in a special sharps container. Do not put them in a trash can. If you do not have a sharps container, call your pharmacist or care team to get one. Talk to your care team  about the use of this medication in children. While it may be given to children for selected conditions, precautions do apply. Overdosage: If you think you have taken too much of this medicine contact a poison control center or emergency room at once. NOTE: This medicine is only for you. Do not share this medicine with others. What if I miss a dose? If you get this medication at the hospital or clinic: It is important not to miss your dose. Call your care team if you are unable to keep an appointment. If you give yourself this medication at home: If you miss a dose, take it as soon as you can. Then continue your normal schedule. If it is almost time for your next dose, take only that dose. Do not take double or extra doses. Call your care team with questions. What may interact with this medication? Live virus vaccines This list may not describe all possible interactions. Give your health care provider a list of all the medicines, herbs, non-prescription drugs, or dietary supplements you use. Also tell them if you smoke, drink alcohol , or use illegal drugs. Some items may interact with your medicine. What should I watch for while using this medication? Your condition will be monitored carefully while you are receiving this medication. Tell your care team if your symptoms do not start to get better or if they get worse. You may need blood work done while you are taking this medication. This medication increases the risk of blood clots. People with heart, blood vessel, or blood clotting conditions are more likely to develop a blood clot. Other risk factors include advanced age, estrogen use, tobacco  use, lack of movement, and being overweight. This medication can decrease the response to a vaccine. If you need to get vaccinated, tell your care team if you have received this medication within the last year. Extra booster doses may be needed. Talk to your care team to see if a different vaccination schedule  is needed. This medication is made from donated human blood. There is a small risk it may contain bacteria or viruses, such as hepatitis or HIV. All products are processed to kill most bacteria and viruses. Talk to your care team if you have questions about the risk of infection. If you have diabetes, talk to your care team about which device you should use to check your blood sugar. This medication may cause some devices to report falsely high blood sugar levels. This may cause you to react by not treating a low blood sugar level or by giving an insulin  dose that was not needed. This can cause severe low blood sugar levels. What side effects may I notice from receiving this medication? Side effects that you should report to your care team as soon as possible: Allergic reactions--skin rash, itching, hives, swelling of the face, lips, tongue, or throat Blood clot--pain, swelling, or warmth in the leg, shortness of breath, chest pain Fever, neck pain or stiffness, sensitivity to light, headache, nausea, vomiting, confusion, which may be signs of meningitis Hemolytic anemia--unusual weakness or fatigue, dizziness, headache, trouble breathing, dark urine, yellowing skin or eyes Kidney injury--decrease in the amount of urine, swelling of the ankles, hands, or feet Low sodium level--muscle weakness, fatigue, dizziness, headache, confusion Shortness of breath or trouble breathing, cough, unusual weakness or fatigue, blue skin or lips Side effects that usually do not require medical attention (report these to your care team if they continue or are bothersome): Chills Diarrhea Fever Headache Nausea This list may not describe all possible side effects. Call your doctor for medical advice about side effects. You may report side effects to FDA at 1-800-FDA-1088. Where should I keep my medication? Keep out of the reach of children and pets. You will be instructed on how to store this medication. Get rid of  any unused medication after the expiration date. To get rid of medications that are no longer needed or have expired: Take the medication to a medication take-back program. Check with your pharmacy or law enforcement to find a location. If you cannot return the medication, ask your pharmacist or care team how to get rid of this medication safely. NOTE: This sheet is a summary. It may not cover all possible information. If you have questions about this medicine, talk to your doctor, pharmacist, or health care provider.  2025 Elsevier/Gold Standard (2023-11-13 00:00:00)

## 2024-07-29 ENCOUNTER — Ambulatory Visit (INDEPENDENT_AMBULATORY_CARE_PROVIDER_SITE_OTHER)

## 2024-07-29 ENCOUNTER — Encounter: Payer: Self-pay | Admitting: Hematology

## 2024-07-29 VITALS — BP 149/76 | HR 65 | Temp 98.5°F | Resp 12 | Ht 64.5 in | Wt 226.4 lb

## 2024-07-29 DIAGNOSIS — D508 Other iron deficiency anemias: Secondary | ICD-10-CM | POA: Diagnosis not present

## 2024-07-29 MED ORDER — IRON SUCROSE 300 MG IVPB - SIMPLE MED
300.0000 mg | Status: DC
  Administered 2024-07-29: 300 mg via INTRAVENOUS

## 2024-07-29 NOTE — Progress Notes (Signed)
 Diagnosis: Iron  Deficiency Anemia  Provider:  Praveen Mannam MD  Procedure: IV Infusion  IV Type: Peripheral, IV Location: L Antecubital  Venofer  (Iron  Sucrose), Dose: 300 mg  Infusion Start Time: 0911  Infusion Stop Time: 1058  Post Infusion IV Care: Patient declined observation and Peripheral IV Discontinued  Discharge: Condition: Good, Destination: Home . AVS Declined  Performed by:  Rehan Holness, RN

## 2024-07-30 ENCOUNTER — Ambulatory Visit

## 2024-07-31 ENCOUNTER — Other Ambulatory Visit: Payer: Self-pay

## 2024-07-31 ENCOUNTER — Other Ambulatory Visit (HOSPITAL_COMMUNITY): Payer: Self-pay

## 2024-07-31 DIAGNOSIS — F331 Major depressive disorder, recurrent, moderate: Secondary | ICD-10-CM | POA: Diagnosis not present

## 2024-07-31 DIAGNOSIS — Z1331 Encounter for screening for depression: Secondary | ICD-10-CM | POA: Diagnosis not present

## 2024-07-31 DIAGNOSIS — E782 Mixed hyperlipidemia: Secondary | ICD-10-CM | POA: Diagnosis not present

## 2024-07-31 DIAGNOSIS — E1122 Type 2 diabetes mellitus with diabetic chronic kidney disease: Secondary | ICD-10-CM | POA: Diagnosis not present

## 2024-07-31 DIAGNOSIS — Z792 Long term (current) use of antibiotics: Secondary | ICD-10-CM | POA: Diagnosis not present

## 2024-07-31 DIAGNOSIS — C8300 Small cell B-cell lymphoma, unspecified site: Secondary | ICD-10-CM | POA: Diagnosis not present

## 2024-07-31 DIAGNOSIS — Z1339 Encounter for screening examination for other mental health and behavioral disorders: Secondary | ICD-10-CM | POA: Diagnosis not present

## 2024-07-31 DIAGNOSIS — D509 Iron deficiency anemia, unspecified: Secondary | ICD-10-CM | POA: Diagnosis not present

## 2024-07-31 DIAGNOSIS — I129 Hypertensive chronic kidney disease with stage 1 through stage 4 chronic kidney disease, or unspecified chronic kidney disease: Secondary | ICD-10-CM | POA: Diagnosis not present

## 2024-07-31 DIAGNOSIS — R82998 Other abnormal findings in urine: Secondary | ICD-10-CM | POA: Diagnosis not present

## 2024-07-31 DIAGNOSIS — N1832 Chronic kidney disease, stage 3b: Secondary | ICD-10-CM | POA: Diagnosis not present

## 2024-07-31 DIAGNOSIS — G629 Polyneuropathy, unspecified: Secondary | ICD-10-CM | POA: Diagnosis not present

## 2024-07-31 DIAGNOSIS — Z0000 Encounter for general adult medical examination without abnormal findings: Secondary | ICD-10-CM | POA: Diagnosis not present

## 2024-07-31 MED ORDER — CEPHALEXIN 500 MG PO CAPS
1000.0000 mg | ORAL_CAPSULE | Freq: Every day | ORAL | 1 refills | Status: AC
  Filled 2024-07-31: qty 20, 10d supply, fill #0

## 2024-08-01 ENCOUNTER — Other Ambulatory Visit (HOSPITAL_COMMUNITY): Payer: Self-pay

## 2024-08-09 ENCOUNTER — Other Ambulatory Visit (HOSPITAL_COMMUNITY): Payer: Self-pay

## 2024-08-09 ENCOUNTER — Other Ambulatory Visit: Payer: Self-pay

## 2024-08-12 ENCOUNTER — Other Ambulatory Visit (HOSPITAL_COMMUNITY): Payer: Self-pay

## 2024-08-20 ENCOUNTER — Other Ambulatory Visit: Payer: Self-pay

## 2024-08-20 ENCOUNTER — Other Ambulatory Visit: Payer: Self-pay | Admitting: Physician Assistant

## 2024-08-20 ENCOUNTER — Other Ambulatory Visit: Payer: Self-pay | Admitting: Hematology

## 2024-08-20 ENCOUNTER — Inpatient Hospital Stay

## 2024-08-20 ENCOUNTER — Inpatient Hospital Stay: Admitting: Physician Assistant

## 2024-08-20 ENCOUNTER — Other Ambulatory Visit (HOSPITAL_COMMUNITY): Payer: Self-pay

## 2024-08-20 DIAGNOSIS — C9110 Chronic lymphocytic leukemia of B-cell type not having achieved remission: Secondary | ICD-10-CM

## 2024-08-20 MED ORDER — VENETOCLAX 100 MG PO TABS
100.0000 mg | ORAL_TABLET | Freq: Every day | ORAL | 3 refills | Status: AC
  Filled 2024-08-20: qty 28, 28d supply, fill #0
  Filled 2024-09-17: qty 28, 28d supply, fill #1
  Filled 2024-10-16: qty 28, 28d supply, fill #2

## 2024-08-20 NOTE — Progress Notes (Signed)
 Specialty Pharmacy Refill Coordination Note  MyChart Questionnaire Submission  Janice [name] is a 73 y.o. female contacted today regarding refills of specialty medication(s) Venclexta .  Doses on hand: (Patient-Rptd) 13   Patient requested: (Patient-Rptd) Delivery   Delivery date: 08/28/24  Verified address: [address] [place] [address]  Medication will be filled on 08/27/24  This fill date is pending response to refill request from provider. Patient is aware and if they have not received fill by intended date, they must follow up with pharmacy.

## 2024-08-21 ENCOUNTER — Inpatient Hospital Stay: Admitting: Physician Assistant

## 2024-08-21 ENCOUNTER — Inpatient Hospital Stay

## 2024-08-21 ENCOUNTER — Inpatient Hospital Stay: Attending: Hematology

## 2024-08-21 VITALS — BP 156/41 | HR 67 | Temp 98.2°F | Resp 18 | Ht 64.5 in | Wt 231.8 lb

## 2024-08-21 VITALS — BP 145/55 | HR 65 | Temp 98.5°F | Resp 16

## 2024-08-21 DIAGNOSIS — Z7951 Long term (current) use of inhaled steroids: Secondary | ICD-10-CM | POA: Diagnosis not present

## 2024-08-21 DIAGNOSIS — R110 Nausea: Secondary | ICD-10-CM | POA: Diagnosis not present

## 2024-08-21 DIAGNOSIS — Z79899 Other long term (current) drug therapy: Secondary | ICD-10-CM | POA: Diagnosis not present

## 2024-08-21 DIAGNOSIS — M25519 Pain in unspecified shoulder: Secondary | ICD-10-CM | POA: Insufficient documentation

## 2024-08-21 DIAGNOSIS — D509 Iron deficiency anemia, unspecified: Secondary | ICD-10-CM | POA: Diagnosis not present

## 2024-08-21 DIAGNOSIS — K7460 Unspecified cirrhosis of liver: Secondary | ICD-10-CM | POA: Diagnosis not present

## 2024-08-21 DIAGNOSIS — R161 Splenomegaly, not elsewhere classified: Secondary | ICD-10-CM | POA: Diagnosis not present

## 2024-08-21 DIAGNOSIS — C9110 Chronic lymphocytic leukemia of B-cell type not having achieved remission: Secondary | ICD-10-CM

## 2024-08-21 DIAGNOSIS — D696 Thrombocytopenia, unspecified: Secondary | ICD-10-CM | POA: Diagnosis not present

## 2024-08-21 DIAGNOSIS — D801 Nonfamilial hypogammaglobulinemia: Secondary | ICD-10-CM

## 2024-08-21 DIAGNOSIS — M549 Dorsalgia, unspecified: Secondary | ICD-10-CM | POA: Insufficient documentation

## 2024-08-21 DIAGNOSIS — D500 Iron deficiency anemia secondary to blood loss (chronic): Secondary | ICD-10-CM

## 2024-08-21 DIAGNOSIS — Z7982 Long term (current) use of aspirin: Secondary | ICD-10-CM | POA: Insufficient documentation

## 2024-08-21 DIAGNOSIS — Z7969 Long term (current) use of other immunomodulators and immunosuppressants: Secondary | ICD-10-CM | POA: Insufficient documentation

## 2024-08-21 DIAGNOSIS — K5900 Constipation, unspecified: Secondary | ICD-10-CM | POA: Diagnosis not present

## 2024-08-21 DIAGNOSIS — C8300 Small cell B-cell lymphoma, unspecified site: Secondary | ICD-10-CM

## 2024-08-21 DIAGNOSIS — R188 Other ascites: Secondary | ICD-10-CM | POA: Insufficient documentation

## 2024-08-21 LAB — CBC WITH DIFFERENTIAL (CANCER CENTER ONLY)
Abs Immature Granulocytes: 0.01 K/uL (ref 0.00–0.07)
Basophils Absolute: 0 K/uL (ref 0.0–0.1)
Basophils Relative: 0 %
Eosinophils Absolute: 0 K/uL (ref 0.0–0.5)
Eosinophils Relative: 1 %
HCT: 41.8 % (ref 36.0–46.0)
Hemoglobin: 13 g/dL (ref 12.0–15.0)
Immature Granulocytes: 0 %
Lymphocytes Relative: 17 %
Lymphs Abs: 0.6 K/uL — ABNORMAL LOW (ref 0.7–4.0)
MCH: 25.6 pg — ABNORMAL LOW (ref 26.0–34.0)
MCHC: 31.1 g/dL (ref 30.0–36.0)
MCV: 82.4 fL (ref 80.0–100.0)
Monocytes Absolute: 0.4 K/uL (ref 0.1–1.0)
Monocytes Relative: 10 %
Neutro Abs: 2.7 K/uL (ref 1.7–7.7)
Neutrophils Relative %: 72 %
Platelet Count: 44 K/uL — ABNORMAL LOW (ref 150–400)
RBC: 5.07 MIL/uL (ref 3.87–5.11)
RDW: 17.5 % — ABNORMAL HIGH (ref 11.5–15.5)
WBC Count: 3.7 K/uL — ABNORMAL LOW (ref 4.0–10.5)
nRBC: 0 % (ref 0.0–0.2)

## 2024-08-21 LAB — LACTATE DEHYDROGENASE: LDH: 188 U/L (ref 105–235)

## 2024-08-21 LAB — IRON AND IRON BINDING CAPACITY (CC-WL,HP ONLY)
Iron: 59 ug/dL (ref 28–170)
Saturation Ratios: 15 % (ref 10.4–31.8)
TIBC: 402 ug/dL (ref 250–450)
UIBC: 343 ug/dL

## 2024-08-21 LAB — CMP (CANCER CENTER ONLY)
ALT: 32 U/L (ref 0–44)
AST: 29 U/L (ref 15–41)
Albumin: 4.5 g/dL (ref 3.5–5.0)
Alkaline Phosphatase: 93 U/L (ref 38–126)
Anion gap: 11 (ref 5–15)
BUN: 13 mg/dL (ref 8–23)
CO2: 23 mmol/L (ref 22–32)
Calcium: 9.1 mg/dL (ref 8.9–10.3)
Chloride: 108 mmol/L (ref 98–111)
Creatinine: 0.88 mg/dL (ref 0.44–1.00)
GFR, Estimated: >60 mL/min (ref >60)
Glucose, Bld: 112 mg/dL — ABNORMAL HIGH (ref 70–99)
Potassium: 4 mmol/L (ref 3.5–5.1)
Sodium: 142 mmol/L (ref 135–145)
Total Bilirubin: 0.5 mg/dL (ref 0.0–1.2)
Total Protein: 6.8 g/dL (ref 6.5–8.1)

## 2024-08-21 LAB — FERRITIN: Ferritin: 57 ng/mL (ref 11–307)

## 2024-08-21 LAB — URIC ACID: Uric Acid, Serum: 5.8 mg/dL (ref 2.5–7.1)

## 2024-08-21 MED ORDER — IMMUNE GLOBULIN (HUMAN) 10 GM/100ML IV SOLN
400.0000 mg/kg | INTRAVENOUS | Status: DC
  Administered 2024-08-21: 40 g via INTRAVENOUS
  Filled 2024-08-21: qty 400

## 2024-08-21 MED ORDER — METHYLPREDNISOLONE SODIUM SUCC 125 MG IJ SOLR
40.0000 mg | Freq: Once | INTRAMUSCULAR | Status: AC
  Administered 2024-08-21: 40 mg via INTRAVENOUS
  Filled 2024-08-21: qty 2

## 2024-08-21 MED ORDER — DIPHENHYDRAMINE HCL 25 MG PO CAPS
25.0000 mg | ORAL_CAPSULE | Freq: Once | ORAL | Status: AC
  Administered 2024-08-21: 25 mg via ORAL
  Filled 2024-08-21: qty 1

## 2024-08-21 MED ORDER — ONDANSETRON HCL 4 MG/2ML IJ SOLN
4.0000 mg | Freq: Once | INTRAMUSCULAR | Status: AC
  Administered 2024-08-21: 4 mg via INTRAVENOUS
  Filled 2024-08-21: qty 2

## 2024-08-21 MED ORDER — DEXTROSE 5 % IV SOLN: INTRAVENOUS | Status: DC

## 2024-08-21 MED ORDER — ACETAMINOPHEN 325 MG PO TABS
650.0000 mg | ORAL_TABLET | Freq: Once | ORAL | Status: AC
  Administered 2024-08-21: 650 mg via ORAL
  Filled 2024-08-21: qty 2

## 2024-08-21 NOTE — Progress Notes (Signed)
 Patient declined post IVIG observation.  Tolerated treatment well without incident.  VSS at discharge.  Ambulated to lobby.

## 2024-08-21 NOTE — Progress Notes (Unsigned)
 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 female who is here for continued evaluation and management of her CLL with hypogammaglobinemia. Last seen by Dr. Onesimo on 06/24/2024. She is unaccompanied for this visit.   Janice [name] reports her energy and appetite are overall unchanged. She is able to complete her ADLs on her own. She reports occasional nausea without vomiting. Her diarrhea has improved since discontinuing metformin . She does have occasional constipation that improves with fiber intake. She denies easy bruising  or overt signs of bleeding. She reports having possible vaginal yeast infection with itching. She plans to have a follow up with gynecology to further evaluate. She has upper back and shoulder pain that she questions could be arthritis. She denies fevers, chills, sweats, shortness of breath, chest pain or cough. She has no other complaints.   MEDICAL HISTORY:  Past Medical History:  Diagnosis Date   Allergy    Asthma    seasonal   Cellulitis 2013   Left toe   Chronic kidney disease    stage 1   Cirrhosis (HCC)    Common migraine    History of   Complication of anesthesia    Per pt/had breathing problems with block during rotator cuff surgery. 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   Coronary artery disease    DDD (degenerative disc disease)    Depression    denies takes paxil for migraines   Diabetes mellitus    type 2   Diabetic peripheral neuropathy (HCC)    Difficult airway for intubation 10/20/2022   DLx 1 with MAC 4, unable to see cords, DL with lopro S3 glidescope, easy  atraumatic intubation   Difficult intubation 03/03/2023   Diverticulitis    Diverticulosis    DJD (degenerative joint disease)    Esophageal varices (HCC)    GERD (gastroesophageal reflux disease)    History of colon polyps    hyperplastic   History of gastric polyp    Hyperlipidemia    Hypertension    Iron  deficiency anemia    Obesity    OSA on CPAP    cpap   Peripheral neuropathy    Pneumonia    april 2020  mild   Primary localized osteoarthritis of left knee 03/27/2019   Sensorineural hearing loss    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HCC)     SURGICAL HISTORY: Past Surgical History:  Procedure Laterality Date   ANKLE SURGERY     Left    BREAST BIOPSY Left 2021   left axilla, lymphoma   COLONOSCOPY  05/2018   ESOPHAGOGASTRODUODENOSCOPY N/A 05/16/2024   Procedure: EGD (ESOPHAGOGASTRODUODENOSCOPY);  Surgeon: Avram Lupita BRAVO, MD;  Location: [facility];  Service: Gastroenterology;  Laterality: N/A;   ESOPHAGOGASTRODUODENOSCOPY (EGD) WITH PROPOFOL  N/A 03/09/2023   Procedure: ESOPHAGOGASTRODUODENOSCOPY (EGD) WITH PROPOFOL ;  Surgeon: Avram Lupita BRAVO, MD;  Location: [facility];  Service: Gastroenterology;  Laterality: N/A;   LEEP N/A 09/14/2018   Procedure: LOOP ELECTROSURGICAL EXCISION PROCEDURE (LEEP);  Surgeon: Leva Rush, MD;  Location: [facility];  Service: Gynecology;  Laterality: N/A;   MASS EXCISION Right 06/24/2021   Procedure: EXCISION RIGHT POSTERIOR NECK MASS;  Surgeon: Stevie Herlene Righter,  MD;  Location: [facility];  Service: General;  Laterality: Right;   ROTATOR CUFF REPAIR Bilateral 2012, 2015   TONSILLECTOMY     TOTAL KNEE ARTHROPLASTY Left 04/08/2019   Procedure: TOTAL KNEE ARTHROPLASTY;  Surgeon: Jane Charleston, MD;  Location: [facility];  Service: Orthopedics;  Laterality: Left;   UMBILICAL HERNIA REPAIR N/A 10/20/2022   Procedure: OPEN HERNIA REPAIR UMBILICAL ADULT with Mesh;  Surgeon: Kinsinger, Herlene Righter, MD;  Location: [facility];  Service: General;   Laterality: N/A;   UPPER GI ENDOSCOPY  05/2018    SOCIAL HISTORY: Social History   Socioeconomic History   Marital status: Widowed    Spouse name: Not on file   Number of children: 0   Years of education: 3 years college   Highest education level: Not on file  Occupational History   Occupation: Unemployed  Tobacco Use   Smoking status: Never    Passive exposure: Past   Smokeless tobacco: Never  Vaping Use   Vaping status: Never Used  Substance and Sexual Activity   Alcohol  use: Not Currently    Comment: very rare   Drug use: No   Sexual activity: Not Currently    Birth control/protection: None, Post-menopausal  Other Topics Concern   Not on file  Social History Narrative   1 caffeine drink daily    Right-handed   widow (husband died from [place] cirrhosis)   Social Drivers of Health   Financial Resource Strain: Medium Risk (08/20/2020)   Overall Financial Resource Strain (CARDIA)    Difficulty of Paying Living Expenses: Somewhat hard  Food Insecurity: No Food Insecurity (10/20/2022)   Hunger Vital Sign    Worried About Running Out of Food in the Last Year: Never true    Ran Out of Food in the Last Year: Never true  Transportation Needs: No Transportation Needs (10/20/2022)   PRAPARE - Administrator, Civil Service (Medical): No    Lack of Transportation (Non-Medical): No  Physical Activity: Not on file  Stress: Not on file  Social Connections: Unknown (01/25/2022)   Received from [facility]   Social Network    Social Network: Not on file  Intimate Partner Violence: Not At Risk (10/20/2022)   Humiliation, Afraid, Rape, and Kick questionnaire    Fear of Current or Ex-Partner: No    Emotionally Abused: No    Physically Abused: No    Sexually Abused: No    FAMILY HISTORY: Family History  Problem Relation Age of Onset   Breast cancer Mother    Bone cancer Mother    Heart failure Father    Diabetes Mellitus II Father    Hypertension Father    Diabetes  Mellitus II Sister    Obesity Sister    Other Sister        retina problem   Hypertension Brother    Diabetes Mellitus II Brother    Colon cancer Neg Hx    Rectal cancer Neg Hx    Stomach cancer Neg Hx    Esophageal cancer Neg Hx    Pancreatic cancer Neg Hx    Liver disease Neg Hx     ALLERGIES:  is allergic to breo ellipta [fluticasone  furoate-vilanterol], gazyva  [obinutuzumab ], iodine , latex, tessalon  [benzonatate ], zocor [simvastatin], levaquin [levofloxacin], penicillins, povidone-iodine , wellbutrin [bupropion], amoxicillin, chlorhexidine , diflucan  [fluconazole ], thalitone  [chlorthalidone ], amoxicillin-pot clavulanate, and vibra-tab [doxycycline].  MEDICATIONS:  Current Outpatient Medications  Medication Sig Dispense Refill   albuterol  (VENTOLIN  HFA) 108 (90 Base) MCG/ACT inhaler Inhale 1-2 puffs  into the lungs every 6 (six) hours as needed (Cough). 18 g 0   albuterol  (VENTOLIN  HFA) 108 (90 Base) MCG/ACT inhaler Inhale 1-2 puffs into the lungs every 6 (six) hours as needed and as directed for shortness of breath. 6.7 g 4   b complex vitamins capsule Take 1 capsule by mouth in the morning.     bacitracin  500 UNIT/GM ointment Apply topically 2 (two) times daily for 10 days 30 g 0   budesonide -formoterol  (SYMBICORT ) 80-4.5 MCG/ACT inhaler Inhale 2 puffs into the lungs daily. 10.2 g 12   Carboxymethylcellul-Glycerin (REFRESH OPTIVE PF OP) Place 1 drop into both eyes 3 (three) times daily as needed (dry/irritated eyes.). Non-pres     cephALEXin  (KEFLEX ) 500 MG capsule Take 2 capsules (1,000 mg total) by mouth as directed 1 hour prior to dental work. 20 capsule 0   cephALEXin  (KEFLEX ) 500 MG capsule Take 2 capsules (1,000 mg total) by mouth 30-60 minutes prior to dental work. 20 capsule 1   Coenzyme Q10 (CO Q-10) 100 MG CAPS Take 100 mg by mouth in the morning.     Continuous Glucose Sensor (FREESTYLE LIBRE 2 SENSOR) MISC Replace every 14 days and use to check blood sugar continuously. 2  each 3   Continuous Glucose Sensor (FREESTYLE LIBRE 2 SENSOR) MISC Replace every 14 days and use to check blood sugar continuously. 6 each 3   desoximetasone  (TOPICORT ) 0.25 % cream Apply a small amount to skin twice a day as needed for flares 60 g 0   desoximetasone  (TOPICORT ) 0.25 % cream Apply a small amount to affected area twice a day as needed for occasional flares of rash on back and scalp 60 g 0   diclofenac  Sodium (VOLTAREN  ARTHRITIS PAIN) 1 % GEL Apply topically 4 (four) times daily to bilateral knees and ankles 200 g 5   diclofenac  sodium (VOLTAREN ) 1 % GEL Apply 2 g topically 2 (two) times daily as needed (knee pain/ foot /Triger finger).  2   empagliflozin  (JARDIANCE ) 10 MG TABS tablet Take 1 tablet (10 mg total) by mouth daily. 90 tablet 3   fluticasone  (FLONASE ) 50 MCG/ACT nasal spray Place 2 sprays into both nostrils in the morning. 48 g 3   furosemide  (LASIX ) 20 MG tablet Take 1 tablet (20 mg total) by mouth as needed. Take if you gain 1 lb in 24 hours, or 3 lbs in 1 week. Take it daily and stop when weight back to baseline. 30 tablet 6   gabapentin  (NEURONTIN ) 300 MG capsule Take 2 capsules (600 mg total) by mouth at bedtime. 180 capsule 3   glucose blood (ONETOUCH VERIO) test strip Use as directed to check blood sugar 3 times a day. 300 strip 3   glucose blood test strip Use to test blood sugar 3 times daily as directed 300 each 3   hydrocortisone  (ANUSOL -HC) 25 MG suppository Place 1 suppository (25 mg total) rectally daily. 30 suppository 0   insulin  aspart (NOVOLOG  FLEXPEN) 100 UNIT/ML FlexPen Inject 30 Units into the skin daily with breakfast AND 30 Units daily with lunch AND 30 Units daily with supper. 45 mL 3   insulin  aspart (NOVOLOG  FLEXPEN) 100 UNIT/ML FlexPen Inject 30 Units into the skin daily with breakfast AND 30 Units daily with lunch AND 30 Units daily with supper. 90 mL 3   insulin  aspart (NOVOLOG  FLEXPEN) 100 UNIT/ML FlexPen Inject 70 units under the skin with meals  3 times a day (add ISF 10, target 200 - up  to 300 units/day) 65 mL 3   insulin  degludec (TRESIBA  FLEXTOUCH) 200 UNIT/ML FlexTouch Pen Inject 80 Units into the skin daily. 30 mL 5   insulin  degludec (TRESIBA  FLEXTOUCH) 200 UNIT/ML FlexTouch Pen Inject 92 Units into the skin once daily. 42 mL 3   Insulin  Pen Needle 32G X 4 MM MISC Use as directed 5 (five) times daily. 400 each 3   irbesartan  (AVAPRO ) 300 MG tablet Take 1 tablet (300 mg total) by mouth every evening.     irbesartan  (AVAPRO ) 300 MG tablet Take 1 tablet (300 mg total) by mouth every evening. 90 tablet 3   Lancets (ONETOUCH DELICA PLUS LANCET33G) MISC Use to test blood sugar 3 times daily 300 each 2   LORazepam  (ATIVAN ) 0.5 MG tablet Take 1 or 2 before MRI to reduce claustrophobia 2 tablet 0   omeprazole  (PRILOSEC) 40 MG capsule Take 1 capsule (40 mg total) by mouth 2 (two) times daily. 180 capsule 3   ondansetron  (ZOFRAN -ODT) 4 MG disintegrating tablet Dissolve 1 tablet (4 mg) by mouth every 8 hours as needed for nausea or vomiting. 20 tablet 0   prednisoLONE  acetate (PRED FORTE ) 1 % ophthalmic suspension Place 1 drop into both eyes 4 (four) times daily. 10 mL 0   prednisoLONE  acetate (PREDNISOLONE  ACETATE P-F) 1 % ophthalmic suspension Place 1 drop into both eyes 4 (four) times daily. 5 mL 0   rosuvastatin  (CRESTOR ) 10 MG tablet Take 1 tablet (10 mg total) by mouth daily. 90 tablet 3   sertraline  (ZOLOFT ) 50 MG tablet Take 1 tablet (50 mg total) by mouth daily. 90 tablet 3   spironolactone  (ALDACTONE ) 25 MG tablet Take 1 tablet (25 mg total) by mouth daily. 90 tablet 3   terconazole (TERAZOL 3) 0.8 % vaginal cream Place 1 applicator vaginally daily as needed (yeast infections).     venetoclax  (VENCLEXTA ) 100 MG tablet Take 1 tablet (100 mg total) by mouth daily. Tablets should be swallowed whole with a meal and a full glass of water . 30 tablet 3   Zoster Vaccine Adjuvanted (SHINGRIX ) injection Inject into the muscle. 1 each 1   No  current facility-administered medications for this visit.   REVIEW OF SYSTEMS:   10 Point review of Systems was done is negative except as noted above.  PHYSICAL EXAMINATION:  BP (!) 156/41 (BP Location: Right Arm, Patient Position: Sitting) Comment: 154/51  Pulse 67   Temp 98.2 F (36.8 C) (Tympanic)   Resp 18   Ht 5' 4.5 (1.638 m)   Wt 231 lb 12.8 oz (105.1 kg)   SpO2 96%   BMI 39.17 kg/m   GENERAL:alert, in no acute distress and comfortable SKIN: no acute rashes, no significant lesions EYES: conjunctiva are pink and non-injected, sclera anicteric LUNGS: clear to auscultation b/l with normal respiratory effort HEART: regular rate & rhythm ABDOMEN:  normoactive bowel sounds , non tender, no hepatosplenomegaly Extremity: no pedal edema PSYCH: alert & oriented x 3 with fluent speech NEURO: no focal motor/sensory deficits   LABORATORY DATA:  I have reviewed the data as listed  .    Latest Ref Rng & Units 08/21/2024    9:10 AM 07/25/2024    8:30 AM 06/24/2024   11:58 AM  CBC  WBC 4.0 - 10.5 K/uL 3.7  4.3  5.2   Hemoglobin 12.0 - 15.0 g/dL 86.9  87.5  87.5   Hematocrit 36.0 - 46.0 % 41.8  40.0  40.6   Platelets 150 - 400 K/uL 44  48  55    . CBC    Component Value Date/Time   WBC 3.7 (L) 08/21/2024 0910   WBC 3.6 (L) 06/20/2024 1433   RBC 5.07 08/21/2024 0910   HGB 13.0 08/21/2024 0910   HGB 10.3 (L) 10/18/2021 1222   HCT 41.8 08/21/2024 0910   HCT 33.0 (L) 10/18/2021 1222   PLT 44 (L) 08/21/2024 0910   PLT 100 (LL) 10/18/2021 1222   MCV 82.4 08/21/2024 0910   MCV 84 10/18/2021 1222   MCH 25.6 (L) 08/21/2024 0910   MCHC 31.1 08/21/2024 0910   RDW 17.5 (H) 08/21/2024 0910   RDW 15.1 10/18/2021 1222   LYMPHSABS 0.6 (L) 08/21/2024 0910   MONOABS 0.4 08/21/2024 0910   EOSABS 0.0 08/21/2024 0910   BASOSABS 0.0 08/21/2024 0910     .    Latest Ref Rng & Units 07/25/2024    8:30 AM 06/24/2024   11:58 AM 05/27/2024    8:15 AM  CMP  Glucose 70 - 99 mg/dL  839  825  63   BUN 8 - 23 mg/dL 15  19  25    Creatinine 0.44 - 1.00 mg/dL 9.14  9.05  9.05   Sodium 135 - 145 mmol/L 141  141  142   Potassium 3.5 - 5.1 mmol/L 3.9  4.3  4.2   Chloride 98 - 111 mmol/L 109  107  109   CO2 22 - 32 mmol/L 25  26  27    Calcium  8.9 - 10.3 mg/dL 8.9  9.6  9.0   Total Protein 6.5 - 8.1 g/dL 6.4  6.9  6.8   Total Bilirubin 0.0 - 1.2 mg/dL 0.6  0.6  0.5   Alkaline Phos 38 - 126 U/L 86  92  85   AST 15 - 41 U/L 23  22  19    ALT 0 - 44 U/L 23  27  23     LDH Lab Results  Component Value Date   LDH 157 07/25/2024   Iron  Panel Lab Results  Component Value Date   IRON  54 07/25/2024   UIBC 359 07/25/2024   TIBC 413 07/25/2024   IRONPCTSAT 13 07/25/2024   FERRITIN 85 07/25/2024    Lab Results  Component Value Date   LABURIC 7.1 07/25/2024   04/08/2020 FISH/CLL Panel:   03/30/2020 Left Axilla Flow Pathology Report [phone]):   03/30/2020 Left axilla lymph node Bx ([id number]):   Surgical Pathology 05/16/2024 Surgical Pathology Report   Clinical History: Esophageal varices, cirrhosis, R/O neoplasia (las)   FINAL MICROSCOPIC DIAGNOSIS:  A. GASTRIC POLYP, BODY; POLYPECTOMY: - Polypoid fragments of gastric mucosa with edema, foveolar hyperplasia, and bands of smooth muscle in the lamina propria. see comment. - Negative for H. pylori, intestinal metaplasia, dysplasia, and malignancy.  Comment: The differential diagnosis for the stomach polyp includes hamartomatous polyp versus inflammatory / hyperplastic polyp with superimposed prolapse-type change. Background / non-polyp mucosa is not present for evaluation. Correlation with clinical impression and endoscopic findings is required. If clinically appropriate syndromes involving hamartomatous GI polyps (e.g. Peutz-Jeghers syndrome, juvenile polyposis syndrome, Cowden syndrome) should be reasonably excluded.  GROSS DESCRIPTION:  Received in formalin are tan, soft tissue fragments that  are submitted in toto. Number: Multiple size: Range from 0.2 to 0.5 cm blocks: 1 SHIRLEEN 05/16/2024)    Final Diagnosis performed by Rexene Daily, MD.   Electronically signed 05/20/2024 Technical component performed at [facility], [address], [place], [place] [address].  Professional component performed at  [facility]. [address] [place], [place] [address]  Immunohistochemistry Technical component (if applicable) was performed at [facility]. [address], [place], [place] [address].  IMMUNOHISTOCHEMISTRY DISCLAIMER (if applicable): Some of these immunohistochemical stains may have been developed and the performance characteristics determine by [facility]. Some may not have been cleared or approved by the U.S. Food and Drug Administration. The FDA has determined that such clearance or approval is not necessary. This test is used for clinical purposes. It should not be regarded as investigational or for research. This laboratory is certified under the Clinical Laboratory Improvement Amendments of 1988 (CLIA-88) as qualified to perform high complexity clinical laboratory testing.  The controls stained appropriately.     RADIOGRAPHIC STUDIES: I have personally reviewed the radiological images as listed and agreed with the findings in the report. VAS US  CAROTID Result Date: 06/26/2024 Carotid Arterial Duplex Study Patient Name:  Janice [name]  Date of Exam:   06/25/2024 Medical Rec #: [id number]           Accession #:    [id number] Date of Birth: [birth year]            Patient Gender: F Patient Age:   54 years Exam Location:  [address] Procedure:      VAS US  CAROTID Referring Phys: MADONNA LARGE --------------------------------------------------------------------------------  Indications: Carotid artery disease. Performing Technologist: Duwaine Hives RVS  Examination Guidelines: A complete  evaluation includes B-mode imaging, spectral Doppler, color Doppler, and power Doppler as needed of all accessible portions of each vessel. Bilateral testing is considered an integral part of a complete examination. Limited examinations for reoccurring indications may be performed as noted.  Right Carotid Findings: +----------+--------+--------+--------+------------------+--------+           PSV cm/sEDV cm/sStenosisPlaque DescriptionComments +----------+--------+--------+--------+------------------+--------+ CCA Prox  104                     heterogenous               +----------+--------+--------+--------+------------------+--------+ CCA Distal54      9               heterogenous               +----------+--------+--------+--------+------------------+--------+ ICA Prox  82      21              heterogenous               +----------+--------+--------+--------+------------------+--------+ ICA Mid   93      16                                tortuous +----------+--------+--------+--------+------------------+--------+ ICA Distal33      14                                         +----------+--------+--------+--------+------------------+--------+ ECA       115     7                                          +----------+--------+--------+--------+------------------+--------+ +----------+--------+-------+--------+-------------------+           PSV cm/sEDV cmsDescribeArm Pressure (mmHG) +----------+--------+-------+--------+-------------------+ [id number]     0                                  +----------+--------+-------+--------+-------------------+ +---------+--------+--+--------+-+---------+  VertebralPSV cm/s44EDV cm/s7Antegrade +---------+--------+--+--------+-+---------+  Left Carotid Findings: +----------+--------+--------+--------+------------------+--------+           PSV cm/sEDV cm/sStenosisPlaque DescriptionComments  +----------+--------+--------+--------+------------------+--------+ CCA Prox  87      10              heterogenous               +----------+--------+--------+--------+------------------+--------+ CCA Distal68      11              heterogenous               +----------+--------+--------+--------+------------------+--------+ ICA Prox  172     30      1-39%   heterogenous               +----------+--------+--------+--------+------------------+--------+ ICA Mid   143     21                                         +----------+--------+--------+--------+------------------+--------+ ICA Distal101     16                                         +----------+--------+--------+--------+------------------+--------+ ECA       158     10                                         +----------+--------+--------+--------+------------------+--------+ +----------+--------+--------+--------+-------------------+           PSV cm/sEDV cm/sDescribeArm Pressure (mmHG) +----------+--------+--------+--------+-------------------+ Subclavian204     0                                   +----------+--------+--------+--------+-------------------+ +---------+--------+---+--------+--+---------+ VertebralPSV cm/s112EDV cm/s10Antegrade +---------+--------+---+--------+--+---------+   Summary: Right Carotid: The extracranial vessels were near-normal with only minimal wall                thickening or plaque. Left Carotid: Velocities in the left ICA are consistent with a 1-39% stenosis. Vertebrals:  Bilateral vertebral arteries demonstrate antegrade flow. Subclavians: Normal flow hemodynamics were seen in bilateral subclavian              arteries. *See table(s) above for measurements and observations.  Electronically signed by Evalene Lunger MD on 06/26/2024 at 9:16:18 PM.    Final    ASSESSMENT & PLAN:  Janice [name] is a 73 y.o. female who presents to the clinic for continued  management of CLL/SLL.   #CLL/SLL --02/28/2020 MM Breast ([id number]) revealed Bilateral axillary adenopathy. --03/30/2020 Left axilla lymph node biopsy ([id number]) revealed CHRONIC LYMPHOCYTIC LYMPHOMA/SMALL LYMPHOCYTIC LYMPHOMA.  --03/30/2020 Left Axilla Flow Pathology Report [id number]) revealed Monoclonal B-cell population with coexpression of CD5 comprises 80% of all lymphocytes. --04/20/20 PET/CT revealed 1. Adenopathy within the neck, chest, abdomen, and pelvis, consistent with active lymphoma. Much of this is not significantly hypermetabolic. Some upper abdominal nodes are moderately hypermetabolic.  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 Patient denies any overt GI bleeding  --Received IV venofer  300 mg  x 3 doses from 05/27/2024-07/29/2204  PLAN: --Labs from today were reviewed and adequate for treatment. WBC 3.7, Hgb 13.0, Plt 44K, Creatinine and LFTs are normal. --Iron  panel shows no deficiency. No need for additional IV iron  at this time --Continue with venetoclax  100mg  po daily --Continue her IVIG every 4 weeks --Patient requested if CT imaging is needed to assess her response. I will follow up with Dr. Onesimo regarding this.   FOLLOW-UP: Next follow up in 8 weeks.    All of the patient's questions were answered with apparent satisfaction. The patient knows to call the clinic with any problems, questions or concerns.   I have spent a total of 30 minutes minutes of face-to-face and non-face-to-face time, preparing to see the patient,performing a medically appropriate examination, counseling and educating the patient, documenting clinical information in the electronic health record, independently interpreting results and communicating results to the patient, and care  coordination.   Johnston Police PA-C Dept of Hematology and Oncology [facility] Phone: [phone]

## 2024-08-21 NOTE — Patient Instructions (Signed)
 Immune Globulin  Injection What is this medication? IMMUNE GLOBULIN  (im MUNE GLOB yoo lin) treats many immune system conditions. It works by Designer, multimedia extra antibodies. Antibodies are proteins made by the immune system that help protect the body. This medicine may be used for other purposes; ask your health care provider or pharmacist if you have questions. COMMON BRAND NAME(S): ASCENIV, Baygam, BIVIGAM, Carimune, Carimune NF, cutaquig, Cuvitru, Flebogamma, Flebogamma DIF, GamaSTAN, GamaSTAN S/D, Gamimune N, Gammagard, Gammagard S/D, Gammaked, Gammaplex, Gammar-P IV, Gamunex, Gamunex-C, Hizentra, Iveegam, Iveegam EN, Octagam, Panglobulin, Panglobulin NF, panzyga, Polygam S/D, Privigen , Sandoglobulin, Venoglobulin-S, Vigam, Vivaglobulin, Xembify What should I tell my care team before I take this medication? They need to know if you have any of these conditions: Blood clotting disorder Condition where you have excess fluid in your body, such as heart failure or edema Dehydration Diabetes Have had blood clots Heart disease Immune system conditions Kidney disease Low levels of IgA Recent or upcoming vaccine An unusual or allergic reaction to immune globulin , other medications, foods, dyes, or preservatives Pregnant or trying to get pregnant Breastfeeding How should I use this medication? This medication is infused into a vein or under the skin. It may also be injected into a muscle. It is usually given by your care team in a hospital or clinic setting. It may also be given at home. If you get this medication at home, you will be taught how to prepare and give it. Take it as directed on the prescription label. Keep taking it unless your care team tells you to stop. It is important that you put your used needles and syringes in a special sharps container. Do not put them in a trash can. If you do not have a sharps container, call your pharmacist or care team to get one. Talk to your care team  about the use of this medication in children. While it may be given to children for selected conditions, precautions do apply. Overdosage: If you think you have taken too much of this medicine contact a poison control center or emergency room at once. NOTE: This medicine is only for you. Do not share this medicine with others. What if I miss a dose? If you get this medication at the hospital or clinic: It is important not to miss your dose. Call your care team if you are unable to keep an appointment. If you give yourself this medication at home: If you miss a dose, take it as soon as you can. Then continue your normal schedule. If it is almost time for your next dose, take only that dose. Do not take double or extra doses. Call your care team with questions. What may interact with this medication? Live virus vaccines This list may not describe all possible interactions. Give your health care provider a list of all the medicines, herbs, non-prescription drugs, or dietary supplements you use. Also tell them if you smoke, drink alcohol , or use illegal drugs. Some items may interact with your medicine. What should I watch for while using this medication? Your condition will be monitored carefully while you are receiving this medication. Tell your care team if your symptoms do not start to get better or if they get worse. You may need blood work done while you are taking this medication. This medication increases the risk of blood clots. People with heart, blood vessel, or blood clotting conditions are more likely to develop a blood clot. Other risk factors include advanced age, estrogen use, tobacco  use, lack of movement, and being overweight. This medication can decrease the response to a vaccine. If you need to get vaccinated, tell your care team if you have received this medication within the last year. Extra booster doses may be needed. Talk to your care team to see if a different vaccination schedule  is needed. This medication is made from donated human blood. There is a small risk it may contain bacteria or viruses, such as hepatitis or HIV. All products are processed to kill most bacteria and viruses. Talk to your care team if you have questions about the risk of infection. If you have diabetes, talk to your care team about which device you should use to check your blood sugar. This medication may cause some devices to report falsely high blood sugar levels. This may cause you to react by not treating a low blood sugar level or by giving an insulin  dose that was not needed. This can cause severe low blood sugar levels. What side effects may I notice from receiving this medication? Side effects that you should report to your care team as soon as possible: Allergic reactions--skin rash, itching, hives, swelling of the face, lips, tongue, or throat Blood clot--pain, swelling, or warmth in the leg, shortness of breath, chest pain Fever, neck pain or stiffness, sensitivity to light, headache, nausea, vomiting, confusion, which may be signs of meningitis Hemolytic anemia--unusual weakness or fatigue, dizziness, headache, trouble breathing, dark urine, yellowing skin or eyes Kidney injury--decrease in the amount of urine, swelling of the ankles, hands, or feet Low sodium level--muscle weakness, fatigue, dizziness, headache, confusion Shortness of breath or trouble breathing, cough, unusual weakness or fatigue, blue skin or lips Side effects that usually do not require medical attention (report these to your care team if they continue or are bothersome): Chills Diarrhea Fever Headache Nausea This list may not describe all possible side effects. Call your doctor for medical advice about side effects. You may report side effects to FDA at 1-800-FDA-1088. Where should I keep my medication? Keep out of the reach of children and pets. You will be instructed on how to store this medication. Get rid of  any unused medication after the expiration date. To get rid of medications that are no longer needed or have expired: Take the medication to a medication take-back program. Check with your pharmacy or law enforcement to find a location. If you cannot return the medication, ask your pharmacist or care team how to get rid of this medication safely. NOTE: This sheet is a summary. It may not cover all possible information. If you have questions about this medicine, talk to your doctor, pharmacist, or health care provider.  2025 Elsevier/Gold Standard (2023-11-13 00:00:00)

## 2024-08-22 ENCOUNTER — Other Ambulatory Visit (HOSPITAL_COMMUNITY): Payer: Self-pay

## 2024-08-22 ENCOUNTER — Other Ambulatory Visit: Payer: Self-pay

## 2024-08-22 ENCOUNTER — Encounter: Payer: Self-pay | Admitting: Hematology

## 2024-08-22 DIAGNOSIS — N760 Acute vaginitis: Secondary | ICD-10-CM | POA: Diagnosis not present

## 2024-08-22 MED ORDER — FLUCONAZOLE 150 MG PO TABS
150.0000 mg | ORAL_TABLET | ORAL | 0 refills | Status: AC
  Filled 2024-08-22: qty 2, 2d supply, fill #0

## 2024-08-22 MED ORDER — NYSTATIN-TRIAMCINOLONE 100000-0.1 UNIT/GM-% EX OINT
1.0000 | TOPICAL_OINTMENT | Freq: Two times a day (BID) | CUTANEOUS | 0 refills | Status: AC
  Filled 2024-08-22: qty 30, 15d supply, fill #0

## 2024-08-23 ENCOUNTER — Other Ambulatory Visit (HOSPITAL_COMMUNITY): Payer: Self-pay

## 2024-08-23 MED ORDER — PREDNISONE 20 MG PO TABS
ORAL_TABLET | ORAL | 0 refills | Status: AC
  Filled 2024-08-23 (×2): qty 9, 6d supply, fill #0

## 2024-08-26 ENCOUNTER — Other Ambulatory Visit: Payer: Self-pay

## 2024-08-26 ENCOUNTER — Other Ambulatory Visit (HOSPITAL_COMMUNITY): Payer: Self-pay

## 2024-08-27 ENCOUNTER — Other Ambulatory Visit: Payer: Self-pay

## 2024-08-27 ENCOUNTER — Encounter: Payer: Self-pay | Admitting: Hematology

## 2024-08-27 ENCOUNTER — Other Ambulatory Visit (HOSPITAL_COMMUNITY): Payer: Self-pay

## 2024-08-27 MED ORDER — NOVOLOG FLEXPEN 100 UNIT/ML ~~LOC~~ SOPN
PEN_INJECTOR | SUBCUTANEOUS | 3 refills | Status: AC
  Filled 2024-08-27: qty 63, 30d supply, fill #0
  Filled 2024-10-02 (×3): qty 63, 30d supply, fill #1

## 2024-09-02 ENCOUNTER — Other Ambulatory Visit: Payer: Self-pay | Admitting: Nurse Practitioner

## 2024-09-02 ENCOUNTER — Other Ambulatory Visit (HOSPITAL_COMMUNITY): Payer: Self-pay

## 2024-09-02 DIAGNOSIS — R110 Nausea: Secondary | ICD-10-CM

## 2024-09-02 DIAGNOSIS — C9110 Chronic lymphocytic leukemia of B-cell type not having achieved remission: Secondary | ICD-10-CM

## 2024-09-02 DIAGNOSIS — D500 Iron deficiency anemia secondary to blood loss (chronic): Secondary | ICD-10-CM

## 2024-09-02 DIAGNOSIS — C8300 Small cell B-cell lymphoma, unspecified site: Secondary | ICD-10-CM

## 2024-09-02 MED ORDER — ONDANSETRON 4 MG PO TBDP
4.0000 mg | ORAL_TABLET | Freq: Three times a day (TID) | ORAL | 0 refills | Status: AC | PRN
  Filled 2024-09-02: qty 20, 7d supply, fill #0

## 2024-09-11 ENCOUNTER — Other Ambulatory Visit (HOSPITAL_COMMUNITY): Payer: Self-pay

## 2024-09-13 ENCOUNTER — Other Ambulatory Visit (HOSPITAL_COMMUNITY): Payer: Self-pay

## 2024-09-16 ENCOUNTER — Other Ambulatory Visit: Payer: Self-pay

## 2024-09-16 DIAGNOSIS — D500 Iron deficiency anemia secondary to blood loss (chronic): Secondary | ICD-10-CM

## 2024-09-16 DIAGNOSIS — C9110 Chronic lymphocytic leukemia of B-cell type not having achieved remission: Secondary | ICD-10-CM

## 2024-09-17 ENCOUNTER — Inpatient Hospital Stay

## 2024-09-17 ENCOUNTER — Inpatient Hospital Stay: Attending: Hematology

## 2024-09-17 ENCOUNTER — Other Ambulatory Visit (HOSPITAL_COMMUNITY): Payer: Self-pay

## 2024-09-17 ENCOUNTER — Other Ambulatory Visit: Payer: Self-pay

## 2024-09-17 VITALS — BP 136/58 | HR 63 | Temp 98.2°F | Resp 16 | Ht 64.5 in | Wt 226.2 lb

## 2024-09-17 DIAGNOSIS — D801 Nonfamilial hypogammaglobulinemia: Secondary | ICD-10-CM | POA: Insufficient documentation

## 2024-09-17 DIAGNOSIS — C9110 Chronic lymphocytic leukemia of B-cell type not having achieved remission: Secondary | ICD-10-CM | POA: Insufficient documentation

## 2024-09-17 DIAGNOSIS — D500 Iron deficiency anemia secondary to blood loss (chronic): Secondary | ICD-10-CM

## 2024-09-17 DIAGNOSIS — C8300 Small cell B-cell lymphoma, unspecified site: Secondary | ICD-10-CM

## 2024-09-17 LAB — CMP (CANCER CENTER ONLY)
ALT: 32 U/L (ref 0–44)
AST: 28 U/L (ref 15–41)
Albumin: 4.2 g/dL (ref 3.5–5.0)
Alkaline Phosphatase: 101 U/L (ref 38–126)
Anion gap: 11 (ref 5–15)
BUN: 17 mg/dL (ref 8–23)
CO2: 25 mmol/L (ref 22–32)
Calcium: 9.3 mg/dL (ref 8.9–10.3)
Chloride: 108 mmol/L (ref 98–111)
Creatinine: 0.89 mg/dL (ref 0.44–1.00)
GFR, Estimated: >60 mL/min
Glucose, Bld: 96 mg/dL (ref 70–99)
Potassium: 3.9 mmol/L (ref 3.5–5.1)
Sodium: 144 mmol/L (ref 135–145)
Total Bilirubin: 0.4 mg/dL (ref 0.0–1.2)
Total Protein: 6.6 g/dL (ref 6.5–8.1)

## 2024-09-17 LAB — IRON AND IRON BINDING CAPACITY (CC-WL,HP ONLY)
Iron: 53 ug/dL (ref 28–170)
Saturation Ratios: 13 % (ref 10.4–31.8)
TIBC: 395 ug/dL (ref 250–450)
UIBC: 342 ug/dL

## 2024-09-17 LAB — CBC WITH DIFFERENTIAL (CANCER CENTER ONLY)
Abs Immature Granulocytes: 0.01 K/uL (ref 0.00–0.07)
Basophils Absolute: 0 K/uL (ref 0.0–0.1)
Basophils Relative: 1 %
Eosinophils Absolute: 0 K/uL (ref 0.0–0.5)
Eosinophils Relative: 1 %
HCT: 40.3 % (ref 36.0–46.0)
Hemoglobin: 12.9 g/dL (ref 12.0–15.0)
Immature Granulocytes: 0 %
Lymphocytes Relative: 13 %
Lymphs Abs: 0.5 K/uL — ABNORMAL LOW (ref 0.7–4.0)
MCH: 26.3 pg (ref 26.0–34.0)
MCHC: 32 g/dL (ref 30.0–36.0)
MCV: 82.2 fL (ref 80.0–100.0)
Monocytes Absolute: 0.4 K/uL (ref 0.1–1.0)
Monocytes Relative: 11 %
Neutro Abs: 2.8 K/uL (ref 1.7–7.7)
Neutrophils Relative %: 74 %
Platelet Count: 46 K/uL — ABNORMAL LOW (ref 150–400)
RBC: 4.9 MIL/uL (ref 3.87–5.11)
RDW: 17.5 % — ABNORMAL HIGH (ref 11.5–15.5)
WBC Count: 3.7 K/uL — ABNORMAL LOW (ref 4.0–10.5)
nRBC: 0 % (ref 0.0–0.2)

## 2024-09-17 LAB — FERRITIN: Ferritin: 36 ng/mL (ref 11–307)

## 2024-09-17 LAB — URIC ACID: Uric Acid, Serum: 5.8 mg/dL (ref 2.5–7.1)

## 2024-09-17 MED ORDER — ONDANSETRON HCL 4 MG/2ML IJ SOLN
4.0000 mg | Freq: Once | INTRAMUSCULAR | Status: AC
  Administered 2024-09-17: 4 mg via INTRAVENOUS
  Filled 2024-09-17: qty 2

## 2024-09-17 MED ORDER — DIPHENHYDRAMINE HCL 25 MG PO CAPS
25.0000 mg | ORAL_CAPSULE | Freq: Once | ORAL | Status: AC
  Administered 2024-09-17: 25 mg via ORAL
  Filled 2024-09-17: qty 1

## 2024-09-17 MED ORDER — ACETAMINOPHEN 325 MG PO TABS
650.0000 mg | ORAL_TABLET | Freq: Once | ORAL | Status: AC
  Administered 2024-09-17: 650 mg via ORAL
  Filled 2024-09-17: qty 2

## 2024-09-17 MED ORDER — METHYLPREDNISOLONE SODIUM SUCC 125 MG IJ SOLR
40.0000 mg | Freq: Once | INTRAMUSCULAR | Status: AC
  Administered 2024-09-17: 40 mg via INTRAVENOUS
  Filled 2024-09-17: qty 2

## 2024-09-17 MED ORDER — IMMUNE GLOBULIN (HUMAN) 10 GM/100ML IV SOLN
400.0000 mg/kg | INTRAVENOUS | Status: DC
  Administered 2024-09-17: 40 g via INTRAVENOUS
  Filled 2024-09-17: qty 400

## 2024-09-17 NOTE — Patient Instructions (Signed)
 Immune Globulin  Injection What is this medication? IMMUNE GLOBULIN  (im MUNE GLOB yoo lin) treats many immune system conditions. It works by Designer, multimedia extra antibodies. Antibodies are proteins made by the immune system that help protect the body. This medicine may be used for other purposes; ask your health care provider or pharmacist if you have questions. COMMON BRAND NAME(S): ASCENIV, Baygam, BIVIGAM, Carimune, Carimune NF, cutaquig, Cuvitru, Flebogamma, Flebogamma DIF, GamaSTAN, GamaSTAN S/D, Gamimune N, Gammagard, Gammagard S/D, Gammaked, Gammaplex, Gammar-P IV, Gamunex, Gamunex-C, Hizentra, Iveegam, Iveegam EN, Octagam, Panglobulin, Panglobulin NF, panzyga, Polygam S/D, Privigen , Sandoglobulin, Venoglobulin-S, Vigam, Vivaglobulin, Xembify What should I tell my care team before I take this medication? They need to know if you have any of these conditions: Blood clotting disorder Condition where you have excess fluid in your body, such as heart failure or edema Dehydration Diabetes Have had blood clots Heart disease Immune system conditions Kidney disease Low levels of IgA Recent or upcoming vaccine An unusual or allergic reaction to immune globulin , other medications, foods, dyes, or preservatives Pregnant or trying to get pregnant Breastfeeding How should I use this medication? This medication is infused into a vein or under the skin. It may also be injected into a muscle. It is usually given by your care team in a hospital or clinic setting. It may also be given at home. If you get this medication at home, you will be taught how to prepare and give it. Take it as directed on the prescription label. Keep taking it unless your care team tells you to stop. It is important that you put your used needles and syringes in a special sharps container. Do not put them in a trash can. If you do not have a sharps container, call your pharmacist or care team to get one. Talk to your care team  about the use of this medication in children. While it may be given to children for selected conditions, precautions do apply. Overdosage: If you think you have taken too much of this medicine contact a poison control center or emergency room at once. NOTE: This medicine is only for you. Do not share this medicine with others. What if I miss a dose? If you get this medication at the hospital or clinic: It is important not to miss your dose. Call your care team if you are unable to keep an appointment. If you give yourself this medication at home: If you miss a dose, take it as soon as you can. Then continue your normal schedule. If it is almost time for your next dose, take only that dose. Do not take double or extra doses. Call your care team with questions. What may interact with this medication? Live virus vaccines This list may not describe all possible interactions. Give your health care provider a list of all the medicines, herbs, non-prescription drugs, or dietary supplements you use. Also tell them if you smoke, drink alcohol , or use illegal drugs. Some items may interact with your medicine. What should I watch for while using this medication? Your condition will be monitored carefully while you are receiving this medication. Tell your care team if your symptoms do not start to get better or if they get worse. You may need blood work done while you are taking this medication. This medication increases the risk of blood clots. People with heart, blood vessel, or blood clotting conditions are more likely to develop a blood clot. Other risk factors include advanced age, estrogen use, tobacco  use, lack of movement, and being overweight. This medication can decrease the response to a vaccine. If you need to get vaccinated, tell your care team if you have received this medication within the last year. Extra booster doses may be needed. Talk to your care team to see if a different vaccination schedule  is needed. This medication is made from donated human blood. There is a small risk it may contain bacteria or viruses, such as hepatitis or HIV. All products are processed to kill most bacteria and viruses. Talk to your care team if you have questions about the risk of infection. If you have diabetes, talk to your care team about which device you should use to check your blood sugar. This medication may cause some devices to report falsely high blood sugar levels. This may cause you to react by not treating a low blood sugar level or by giving an insulin  dose that was not needed. This can cause severe low blood sugar levels. What side effects may I notice from receiving this medication? Side effects that you should report to your care team as soon as possible: Allergic reactions--skin rash, itching, hives, swelling of the face, lips, tongue, or throat Blood clot--pain, swelling, or warmth in the leg, shortness of breath, chest pain Fever, neck pain or stiffness, sensitivity to light, headache, nausea, vomiting, confusion, which may be signs of meningitis Hemolytic anemia--unusual weakness or fatigue, dizziness, headache, trouble breathing, dark urine, yellowing skin or eyes Kidney injury--decrease in the amount of urine, swelling of the ankles, hands, or feet Low sodium level--muscle weakness, fatigue, dizziness, headache, confusion Shortness of breath or trouble breathing, cough, unusual weakness or fatigue, blue skin or lips Side effects that usually do not require medical attention (report these to your care team if they continue or are bothersome): Chills Diarrhea Fever Headache Nausea This list may not describe all possible side effects. Call your doctor for medical advice about side effects. You may report side effects to FDA at 1-800-FDA-1088. Where should I keep my medication? Keep out of the reach of children and pets. You will be instructed on how to store this medication. Get rid of  any unused medication after the expiration date. To get rid of medications that are no longer needed or have expired: Take the medication to a medication take-back program. Check with your pharmacy or law enforcement to find a location. If you cannot return the medication, ask your pharmacist or care team how to get rid of this medication safely. NOTE: This sheet is a summary. It may not cover all possible information. If you have questions about this medicine, talk to your doctor, pharmacist, or health care provider.  2025 Elsevier/Gold Standard (2023-11-13 00:00:00)

## 2024-09-17 NOTE — Progress Notes (Signed)
 Pt declined to stay for IVIG post observation.

## 2024-09-18 ENCOUNTER — Inpatient Hospital Stay

## 2024-09-18 ENCOUNTER — Other Ambulatory Visit: Payer: Self-pay

## 2024-09-18 ENCOUNTER — Inpatient Hospital Stay: Admitting: Hematology

## 2024-09-18 ENCOUNTER — Other Ambulatory Visit: Payer: Self-pay | Admitting: Pharmacy Technician

## 2024-09-18 NOTE — Progress Notes (Signed)
 Specialty Pharmacy Refill Coordination Note  Janice [name] is a 74 y.o. female contacted today regarding refills of specialty medication(s) Venetoclax  (VENCLEXTA )   Patient requested Delivery   Delivery date: 09/26/24   Verified address: [address] [name] [address]   Medication will be filled on: 09/25/24

## 2024-09-18 NOTE — Progress Notes (Signed)
 Clinical Intervention Note  Clinical Intervention Notes: Patient reports shoulder and upper back pain and would like to see if this can be caused by venclexta . Advised that it may be the venclexta  but she also mentioned possible arthritis. 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 Advised that she should speak to either PCP or orthopedist to rule out arthritis versus pain from venclexta . She will be following up with PCP soon and will ask about that.   Clinical Intervention Outcomes: Prevention of an adverse drug event   Delon Janice [name] Specialty Pharmacist

## 2024-09-23 ENCOUNTER — Encounter: Payer: Self-pay | Admitting: Hematology

## 2024-09-25 ENCOUNTER — Other Ambulatory Visit: Payer: Self-pay

## 2024-09-26 ENCOUNTER — Other Ambulatory Visit (HOSPITAL_COMMUNITY): Payer: Self-pay

## 2024-09-26 ENCOUNTER — Other Ambulatory Visit: Payer: Self-pay

## 2024-09-27 ENCOUNTER — Other Ambulatory Visit (HOSPITAL_COMMUNITY): Payer: Self-pay

## 2024-09-27 ENCOUNTER — Other Ambulatory Visit: Payer: Self-pay

## 2024-09-27 MED ORDER — TECHLITE PEN NEEDLES 32G X 4 MM MISC
Freq: Every day | 3 refills | Status: AC
  Filled 2024-09-27 – 2024-10-02 (×4): qty 400, 80d supply, fill #0

## 2024-09-27 MED ORDER — OMEPRAZOLE 40 MG PO CPDR
40.0000 mg | DELAYED_RELEASE_CAPSULE | Freq: Two times a day (BID) | ORAL | 0 refills | Status: AC
  Filled 2024-09-27: qty 180, 90d supply, fill #0

## 2024-10-02 ENCOUNTER — Other Ambulatory Visit: Payer: Self-pay

## 2024-10-02 ENCOUNTER — Other Ambulatory Visit (HOSPITAL_COMMUNITY): Payer: Self-pay

## 2024-10-02 MED ORDER — LORAZEPAM 0.5 MG PO TABS
0.5000 mg | ORAL_TABLET | ORAL | 0 refills | Status: DC
  Filled 2024-10-02: qty 2, 1d supply, fill #0

## 2024-10-03 ENCOUNTER — Other Ambulatory Visit: Payer: Self-pay

## 2024-10-03 ENCOUNTER — Other Ambulatory Visit (HOSPITAL_COMMUNITY): Payer: Self-pay

## 2024-10-03 ENCOUNTER — Other Ambulatory Visit (HOSPITAL_BASED_OUTPATIENT_CLINIC_OR_DEPARTMENT_OTHER): Payer: Self-pay

## 2024-10-08 ENCOUNTER — Other Ambulatory Visit: Payer: Self-pay

## 2024-10-08 ENCOUNTER — Encounter: Payer: Self-pay | Admitting: Hematology

## 2024-10-11 ENCOUNTER — Other Ambulatory Visit: Payer: Self-pay

## 2024-10-11 DIAGNOSIS — D801 Nonfamilial hypogammaglobulinemia: Secondary | ICD-10-CM

## 2024-10-11 DIAGNOSIS — C9110 Chronic lymphocytic leukemia of B-cell type not having achieved remission: Secondary | ICD-10-CM

## 2024-10-11 DIAGNOSIS — D500 Iron deficiency anemia secondary to blood loss (chronic): Secondary | ICD-10-CM

## 2024-10-15 ENCOUNTER — Inpatient Hospital Stay: Attending: Hematology

## 2024-10-15 ENCOUNTER — Other Ambulatory Visit: Payer: Self-pay

## 2024-10-15 ENCOUNTER — Inpatient Hospital Stay

## 2024-10-15 ENCOUNTER — Inpatient Hospital Stay: Admitting: Hematology

## 2024-10-15 ENCOUNTER — Inpatient Hospital Stay: Admitting: Physician Assistant

## 2024-10-15 VITALS — BP 126/54 | HR 69 | Temp 97.3°F | Resp 18 | Wt 220.0 lb

## 2024-10-15 DIAGNOSIS — D801 Nonfamilial hypogammaglobulinemia: Secondary | ICD-10-CM

## 2024-10-15 DIAGNOSIS — D500 Iron deficiency anemia secondary to blood loss (chronic): Secondary | ICD-10-CM

## 2024-10-15 DIAGNOSIS — C8300 Small cell B-cell lymphoma, unspecified site: Secondary | ICD-10-CM

## 2024-10-15 DIAGNOSIS — C9110 Chronic lymphocytic leukemia of B-cell type not having achieved remission: Secondary | ICD-10-CM

## 2024-10-15 LAB — CBC WITH DIFFERENTIAL (CANCER CENTER ONLY)
Abs Immature Granulocytes: 0.02 10*3/uL (ref 0.00–0.07)
Basophils Absolute: 0 10*3/uL (ref 0.0–0.1)
Basophils Relative: 1 %
Eosinophils Absolute: 0.1 10*3/uL (ref 0.0–0.5)
Eosinophils Relative: 2 %
HCT: 42.7 % (ref 36.0–46.0)
Hemoglobin: 13.7 g/dL (ref 12.0–15.0)
Immature Granulocytes: 0 %
Lymphocytes Relative: 11 %
Lymphs Abs: 0.5 10*3/uL — ABNORMAL LOW (ref 0.7–4.0)
MCH: 27 pg (ref 26.0–34.0)
MCHC: 32.1 g/dL (ref 30.0–36.0)
MCV: 84.1 fL (ref 80.0–100.0)
Monocytes Absolute: 0.6 10*3/uL (ref 0.1–1.0)
Monocytes Relative: 12 %
Neutro Abs: 3.7 10*3/uL (ref 1.7–7.7)
Neutrophils Relative %: 74 %
Platelet Count: 50 10*3/uL — ABNORMAL LOW (ref 150–400)
RBC: 5.08 MIL/uL (ref 3.87–5.11)
RDW: 16.2 % — ABNORMAL HIGH (ref 11.5–15.5)
WBC Count: 5 10*3/uL (ref 4.0–10.5)
nRBC: 0 % (ref 0.0–0.2)

## 2024-10-15 LAB — CMP (CANCER CENTER ONLY)
ALT: 37 U/L (ref 0–44)
AST: 34 U/L (ref 15–41)
Albumin: 4.5 g/dL (ref 3.5–5.0)
Alkaline Phosphatase: 106 U/L (ref 38–126)
Anion gap: 11 (ref 5–15)
BUN: 23 mg/dL (ref 8–23)
CO2: 22 mmol/L (ref 22–32)
Calcium: 9.1 mg/dL (ref 8.9–10.3)
Chloride: 103 mmol/L (ref 98–111)
Creatinine: 0.9 mg/dL (ref 0.44–1.00)
GFR, Estimated: >60 mL/min
Glucose, Bld: 181 mg/dL — ABNORMAL HIGH (ref 70–99)
Potassium: 4.5 mmol/L (ref 3.5–5.1)
Sodium: 137 mmol/L (ref 135–145)
Total Bilirubin: 0.7 mg/dL (ref 0.0–1.2)
Total Protein: 6.9 g/dL (ref 6.5–8.1)

## 2024-10-15 LAB — IRON AND IRON BINDING CAPACITY (CC-WL,HP ONLY)
Iron: 87 ug/dL (ref 28–170)
Saturation Ratios: 20 % (ref 10.4–31.8)
TIBC: 445 ug/dL (ref 250–450)
UIBC: 359 ug/dL

## 2024-10-15 LAB — URIC ACID: Uric Acid, Serum: 6.6 mg/dL (ref 2.5–7.1)

## 2024-10-15 LAB — FERRITIN: Ferritin: 48 ng/mL (ref 11–307)

## 2024-10-15 MED ORDER — ACETAMINOPHEN 325 MG PO TABS
650.0000 mg | ORAL_TABLET | Freq: Once | ORAL | Status: AC
  Administered 2024-10-15: 650 mg via ORAL
  Filled 2024-10-15: qty 2

## 2024-10-15 MED ORDER — DIPHENHYDRAMINE HCL 25 MG PO CAPS
25.0000 mg | ORAL_CAPSULE | Freq: Once | ORAL | Status: AC
  Administered 2024-10-15: 25 mg via ORAL
  Filled 2024-10-15: qty 1

## 2024-10-15 MED ORDER — METHYLPREDNISOLONE SODIUM SUCC 125 MG IJ SOLR
40.0000 mg | Freq: Once | INTRAMUSCULAR | Status: AC
  Administered 2024-10-15: 40 mg via INTRAVENOUS
  Filled 2024-10-15: qty 2

## 2024-10-15 MED ORDER — SODIUM CHLORIDE 0.9 % IV SOLN: Freq: Once | INTRAVENOUS | Status: DC | PRN

## 2024-10-15 MED ORDER — ONDANSETRON HCL 4 MG/2ML IJ SOLN
4.0000 mg | Freq: Once | INTRAMUSCULAR | Status: AC
  Administered 2024-10-15: 4 mg via INTRAVENOUS
  Filled 2024-10-15: qty 2

## 2024-10-15 MED ORDER — SODIUM CHLORIDE 0.9% FLUSH: 10.0000 mL | Freq: Once | INTRAVENOUS | Status: DC | PRN

## 2024-10-15 MED ORDER — ALTEPLASE 2 MG IJ SOLR: 2.0000 mg | Freq: Once | INTRAMUSCULAR | Status: DC | PRN

## 2024-10-15 MED ORDER — EPINEPHRINE 0.3 MG/0.3ML IJ SOAJ: 0.3000 mg | Freq: Once | INTRAMUSCULAR | Status: DC | PRN

## 2024-10-15 MED ORDER — DIPHENHYDRAMINE HCL 50 MG/ML IJ SOLN: 50.0000 mg | Freq: Once | INTRAMUSCULAR | Status: DC | PRN

## 2024-10-15 MED ORDER — DEXTROSE 5 % IV SOLN: INTRAVENOUS | Status: DC

## 2024-10-15 MED ORDER — HEPARIN SOD (PORK) LOCK FLUSH 100 UNIT/ML IV SOLN: 500.0000 [IU] | Freq: Once | INTRAVENOUS | Status: DC | PRN

## 2024-10-15 MED ORDER — HEPARIN SOD (PORK) LOCK FLUSH 100 UNIT/ML IV SOLN: 250.0000 [IU] | Freq: Once | INTRAVENOUS | Status: DC | PRN

## 2024-10-15 MED ORDER — FAMOTIDINE IN NACL 20-0.9 MG/50ML-% IV SOLN: 20.0000 mg | Freq: Once | INTRAVENOUS | Status: DC | PRN

## 2024-10-15 MED ORDER — METHYLPREDNISOLONE SODIUM SUCC 125 MG IJ SOLR: 125.0000 mg | Freq: Once | INTRAMUSCULAR | Status: DC | PRN

## 2024-10-15 MED ORDER — SODIUM CHLORIDE 0.9% FLUSH: 3.0000 mL | Freq: Once | INTRAVENOUS | Status: DC | PRN

## 2024-10-15 MED ORDER — ALBUTEROL SULFATE HFA 108 (90 BASE) MCG/ACT IN AERS: 2.0000 | INHALATION_SPRAY | Freq: Once | RESPIRATORY_TRACT | Status: DC | PRN

## 2024-10-15 MED ORDER — IMMUNE GLOBULIN (HUMAN) 10 GM/100ML IV SOLN
400.0000 mg/kg | INTRAVENOUS | Status: DC
  Administered 2024-10-15: 40 g via INTRAVENOUS
  Filled 2024-10-15: qty 400

## 2024-10-15 NOTE — Progress Notes (Signed)
 Patient declined post IVIG observation.  Tolerated treatment well without incident.  VSS at discharge.  Ambulated to lobby.

## 2024-10-15 NOTE — Patient Instructions (Signed)
 Immune Globulin  Injection What is this medication? IMMUNE GLOBULIN  (im MUNE GLOB yoo lin) treats many immune system conditions. It works by Designer, multimedia extra antibodies. Antibodies are proteins made by the immune system that help protect the body. This medicine may be used for other purposes; ask your health care provider or pharmacist if you have questions. COMMON BRAND NAME(S): ASCENIV, Baygam, BIVIGAM, Carimune, Carimune NF, cutaquig, Cuvitru, Flebogamma, Flebogamma DIF, GamaSTAN, GamaSTAN S/D, Gamimune N, Gammagard, Gammagard S/D, Gammaked, Gammaplex, Gammar-P IV, Gamunex, Gamunex-C, Hizentra, Iveegam, Iveegam EN, Octagam, Panglobulin, Panglobulin NF, panzyga, Polygam S/D, Privigen , Sandoglobulin, Venoglobulin-S, Vigam, Vivaglobulin, Xembify What should I tell my care team before I take this medication? They need to know if you have any of these conditions: Blood clotting disorder Condition where you have excess fluid in your body, such as heart failure or edema Dehydration Diabetes Have had blood clots Heart disease Immune system conditions Kidney disease Low levels of IgA Recent or upcoming vaccine An unusual or allergic reaction to immune globulin , other medications, foods, dyes, or preservatives Pregnant or trying to get pregnant Breastfeeding How should I use this medication? This medication is infused into a vein or under the skin. It may also be injected into a muscle. It is usually given by your care team in a hospital or clinic setting. It may also be given at home. If you get this medication at home, you will be taught how to prepare and give it. Take it as directed on the prescription label. Keep taking it unless your care team tells you to stop. It is important that you put your used needles and syringes in a special sharps container. Do not put them in a trash can. If you do not have a sharps container, call your pharmacist or care team to get one. Talk to your care team  about the use of this medication in children. While it may be given to children for selected conditions, precautions do apply. Overdosage: If you think you have taken too much of this medicine contact a poison control center or emergency room at once. NOTE: This medicine is only for you. Do not share this medicine with others. What if I miss a dose? If you get this medication at the hospital or clinic: It is important not to miss your dose. Call your care team if you are unable to keep an appointment. If you give yourself this medication at home: If you miss a dose, take it as soon as you can. Then continue your normal schedule. If it is almost time for your next dose, take only that dose. Do not take double or extra doses. Call your care team with questions. What may interact with this medication? Live virus vaccines This list may not describe all possible interactions. Give your health care provider a list of all the medicines, herbs, non-prescription drugs, or dietary supplements you use. Also tell them if you smoke, drink alcohol , or use illegal drugs. Some items may interact with your medicine. What should I watch for while using this medication? Your condition will be monitored carefully while you are receiving this medication. Tell your care team if your symptoms do not start to get better or if they get worse. You may need blood work done while you are taking this medication. This medication increases the risk of blood clots. People with heart, blood vessel, or blood clotting conditions are more likely to develop a blood clot. Other risk factors include advanced age, estrogen use, tobacco  use, lack of movement, and being overweight. This medication can decrease the response to a vaccine. If you need to get vaccinated, tell your care team if you have received this medication within the last year. Extra booster doses may be needed. Talk to your care team to see if a different vaccination schedule  is needed. This medication is made from donated human blood. There is a small risk it may contain bacteria or viruses, such as hepatitis or HIV. All products are processed to kill most bacteria and viruses. Talk to your care team if you have questions about the risk of infection. If you have diabetes, talk to your care team about which device you should use to check your blood sugar. This medication may cause some devices to report falsely high blood sugar levels. This may cause you to react by not treating a low blood sugar level or by giving an insulin  dose that was not needed. This can cause severe low blood sugar levels. What side effects may I notice from receiving this medication? Side effects that you should report to your care team as soon as possible: Allergic reactions--skin rash, itching, hives, swelling of the face, lips, tongue, or throat Blood clot--pain, swelling, or warmth in the leg, shortness of breath, chest pain Fever, neck pain or stiffness, sensitivity to light, headache, nausea, vomiting, confusion, which may be signs of meningitis Hemolytic anemia--unusual weakness or fatigue, dizziness, headache, trouble breathing, dark urine, yellowing skin or eyes Kidney injury--decrease in the amount of urine, swelling of the ankles, hands, or feet Low sodium level--muscle weakness, fatigue, dizziness, headache, confusion Shortness of breath or trouble breathing, cough, unusual weakness or fatigue, blue skin or lips Side effects that usually do not require medical attention (report these to your care team if they continue or are bothersome): Chills Diarrhea Fever Headache Nausea This list may not describe all possible side effects. Call your doctor for medical advice about side effects. You may report side effects to FDA at 1-800-FDA-1088. Where should I keep my medication? Keep out of the reach of children and pets. You will be instructed on how to store this medication. Get rid of  any unused medication after the expiration date. To get rid of medications that are no longer needed or have expired: Take the medication to a medication take-back program. Check with your pharmacy or law enforcement to find a location. If you cannot return the medication, ask your pharmacist or care team how to get rid of this medication safely. NOTE: This sheet is a summary. It may not cover all possible information. If you have questions about this medicine, talk to your doctor, pharmacist, or health care provider.  2025 Elsevier/Gold Standard (2023-11-13 00:00:00)

## 2024-10-16 ENCOUNTER — Inpatient Hospital Stay

## 2024-10-16 ENCOUNTER — Inpatient Hospital Stay: Admitting: Hematology

## 2024-10-16 ENCOUNTER — Other Ambulatory Visit (HOSPITAL_COMMUNITY): Payer: Self-pay

## 2024-10-18 ENCOUNTER — Other Ambulatory Visit: Payer: Self-pay | Admitting: Pharmacist

## 2024-10-18 ENCOUNTER — Other Ambulatory Visit: Payer: Self-pay | Admitting: Hematology

## 2024-10-18 ENCOUNTER — Other Ambulatory Visit: Payer: Self-pay

## 2024-10-18 ENCOUNTER — Other Ambulatory Visit (HOSPITAL_COMMUNITY): Payer: Self-pay

## 2024-10-18 MED ORDER — LORAZEPAM 0.5 MG PO TABS
0.5000 mg | ORAL_TABLET | ORAL | 0 refills | Status: AC
  Filled ????-??-??: fill #0

## 2024-10-18 NOTE — Progress Notes (Signed)
 Specialty Pharmacy Ongoing Clinical Assessment Note  Janice [name] is a 74 y.o. female who is being followed by the specialty pharmacy service for [facility] Oncology   Patient's specialty medication(s) reviewed today: Venetoclax  (VENCLEXTA )   Missed doses in the last 4 weeks: 0   Patient/Caregiver did not have any additional questions or concerns.   Therapeutic benefit summary: Patient is achieving benefit   Adverse events/side effects summary: No adverse events/side effects   Patient's therapy is appropriate to: Continue    Goals Addressed             This Visit's Progress    Achieve or maintain remission   On track    Patient is not on track and no change. Patient will maintain adherence.  Dr. 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         Follow up: 6 months  Lyle LELON Chalk Specialty Pharmacist

## 2024-10-18 NOTE — Progress Notes (Signed)
 Specialty Pharmacy Refill Coordination Note  Janice [name] is a 74 y.o. female contacted today regarding refills of specialty medication(s) Venetoclax  (VENCLEXTA )   Patient requested Delivery   Delivery date: 11/01/24   Verified address: [address] [name] [address]   Medication will be filled on: 10/31/24

## 2024-11-20 ENCOUNTER — Ambulatory Visit: Admitting: Family Medicine

## 2024-11-21 ENCOUNTER — Encounter (HOSPITAL_COMMUNITY)

## 2024-11-25 ENCOUNTER — Other Ambulatory Visit (HOSPITAL_COMMUNITY)

## 2024-12-18 ENCOUNTER — Encounter (INDEPENDENT_AMBULATORY_CARE_PROVIDER_SITE_OTHER): Admitting: Ophthalmology

## 2025-03-18 ENCOUNTER — Ambulatory Visit: Admitting: Family Medicine
